# Patient Record
Sex: Male | Born: 1935 | Race: White | Hispanic: No | Marital: Married | Smoking: Former smoker
Health system: Southern US, Community
[De-identification: ages and names within clinical notes are randomized; demographics above are authoritative.]

## PROBLEM LIST (undated history)

## (undated) DIAGNOSIS — Z4502 Encounter for adjustment and management of automatic implantable cardiac defibrillator: Secondary | ICD-10-CM

## (undated) DIAGNOSIS — I2510 Atherosclerotic heart disease of native coronary artery without angina pectoris: Secondary | ICD-10-CM

## (undated) DIAGNOSIS — I4821 Permanent atrial fibrillation: Secondary | ICD-10-CM

## (undated) DIAGNOSIS — C189 Malignant neoplasm of colon, unspecified: Secondary | ICD-10-CM

## (undated) DIAGNOSIS — I10 Essential (primary) hypertension: Secondary | ICD-10-CM

## (undated) DIAGNOSIS — K913 Postprocedural intestinal obstruction, unspecified as to partial versus complete: Secondary | ICD-10-CM

## (undated) DIAGNOSIS — I272 Pulmonary hypertension, unspecified: Secondary | ICD-10-CM

## (undated) DIAGNOSIS — I5022 Chronic systolic (congestive) heart failure: Secondary | ICD-10-CM

## (undated) DIAGNOSIS — J189 Pneumonia, unspecified organism: Secondary | ICD-10-CM

## (undated) DIAGNOSIS — I480 Paroxysmal atrial fibrillation: Secondary | ICD-10-CM

## (undated) DIAGNOSIS — I340 Nonrheumatic mitral (valve) insufficiency: Secondary | ICD-10-CM

## (undated) DIAGNOSIS — K219 Gastro-esophageal reflux disease without esophagitis: Secondary | ICD-10-CM

## (undated) DIAGNOSIS — I255 Ischemic cardiomyopathy: Secondary | ICD-10-CM

## (undated) DIAGNOSIS — K5790 Diverticulosis of intestine, part unspecified, without perforation or abscess without bleeding: Secondary | ICD-10-CM

## (undated) DIAGNOSIS — N529 Male erectile dysfunction, unspecified: Secondary | ICD-10-CM

## (undated) DIAGNOSIS — D509 Iron deficiency anemia, unspecified: Secondary | ICD-10-CM

## (undated) HISTORY — DX: Diverticulosis of intestine, part unspecified, without perforation or abscess without bleeding: K57.90

## (undated) HISTORY — PX: HERNIA REPAIR: SHX51

## (undated) HISTORY — DX: Encounter for adjustment and management of automatic implantable cardiac defibrillator: Z45.02

## (undated) HISTORY — PX: COLON RESECTION: SHX5231

## (undated) HISTORY — DX: Chronic systolic (congestive) heart failure: I50.22

## (undated) HISTORY — PX: APPENDECTOMY: SHX54

## (undated) HISTORY — DX: Essential (primary) hypertension: I10

## (undated) HISTORY — PX: COLONOSCOPY: SHX174

## (undated) HISTORY — DX: Pneumonia, unspecified organism: J18.9

## (undated) HISTORY — PX: CHOLECYSTECTOMY: SHX55

## (undated) HISTORY — PX: BACK SURGERY: SHX140

## (undated) HISTORY — PX: VASECTOMY: SHX75

## (undated) HISTORY — DX: Postprocedural intestinal obstruction, unspecified as to partial versus complete: K91.30

## (undated) HISTORY — DX: Paroxysmal atrial fibrillation: I48.0

## (undated) HISTORY — PX: CORONARY ARTERY BYPASS GRAFT: SHX141

## (undated) HISTORY — DX: Iron deficiency anemia, unspecified: D50.9

## (undated) HISTORY — DX: Malignant neoplasm of colon, unspecified: C18.9

## (undated) HISTORY — DX: Atherosclerotic heart disease of native coronary artery without angina pectoris: I25.10

## (undated) HISTORY — DX: Male erectile dysfunction, unspecified: N52.9

---

## 1998-11-10 ENCOUNTER — Ambulatory Visit (HOSPITAL_COMMUNITY): Admission: RE | Admit: 1998-11-10 | Discharge: 1998-11-10 | Payer: Self-pay | Admitting: Gastroenterology

## 1998-11-10 ENCOUNTER — Other Ambulatory Visit: Admission: RE | Admit: 1998-11-10 | Discharge: 1998-11-10 | Payer: Self-pay | Admitting: Gastroenterology

## 1998-11-11 ENCOUNTER — Ambulatory Visit (HOSPITAL_COMMUNITY): Admission: RE | Admit: 1998-11-11 | Discharge: 1998-11-11 | Payer: Self-pay | Admitting: Gastroenterology

## 1998-11-11 ENCOUNTER — Encounter: Payer: Self-pay | Admitting: Gastroenterology

## 1998-11-16 ENCOUNTER — Inpatient Hospital Stay (HOSPITAL_COMMUNITY): Admission: RE | Admit: 1998-11-16 | Discharge: 1998-11-16 | Payer: Self-pay | Admitting: Surgery

## 1998-11-16 ENCOUNTER — Encounter: Payer: Self-pay | Admitting: Surgery

## 1998-11-24 ENCOUNTER — Observation Stay (HOSPITAL_COMMUNITY): Admission: AD | Admit: 1998-11-24 | Discharge: 1998-11-25 | Payer: Self-pay | Admitting: Cardiology

## 1998-12-18 ENCOUNTER — Inpatient Hospital Stay (HOSPITAL_COMMUNITY): Admission: RE | Admit: 1998-12-18 | Discharge: 1998-12-25 | Payer: Self-pay | Admitting: Surgery

## 1998-12-21 ENCOUNTER — Encounter: Payer: Self-pay | Admitting: Surgery

## 1998-12-24 ENCOUNTER — Encounter: Payer: Self-pay | Admitting: Oncology

## 1998-12-24 ENCOUNTER — Encounter: Admission: RE | Admit: 1998-12-24 | Discharge: 1999-03-24 | Payer: Self-pay | Admitting: Radiation Oncology

## 1999-03-25 ENCOUNTER — Encounter: Admission: RE | Admit: 1999-03-25 | Discharge: 1999-06-23 | Payer: Self-pay | Admitting: Radiation Oncology

## 2000-11-07 ENCOUNTER — Ambulatory Visit (HOSPITAL_COMMUNITY): Admission: RE | Admit: 2000-11-07 | Discharge: 2000-11-07 | Payer: Self-pay | Admitting: Cardiology

## 2000-11-07 HISTORY — PX: CARDIAC CATHETERIZATION: SHX172

## 2000-11-23 ENCOUNTER — Encounter (INDEPENDENT_AMBULATORY_CARE_PROVIDER_SITE_OTHER): Payer: Self-pay

## 2000-11-23 ENCOUNTER — Ambulatory Visit (HOSPITAL_COMMUNITY): Admission: RE | Admit: 2000-11-23 | Discharge: 2000-11-23 | Payer: Self-pay | Admitting: Internal Medicine

## 2000-11-29 ENCOUNTER — Encounter (HOSPITAL_COMMUNITY): Admission: RE | Admit: 2000-11-29 | Discharge: 2001-02-27 | Payer: Self-pay | Admitting: Internal Medicine

## 2000-11-29 ENCOUNTER — Encounter: Payer: Self-pay | Admitting: Internal Medicine

## 2001-01-05 ENCOUNTER — Encounter: Payer: Self-pay | Admitting: Cardiothoracic Surgery

## 2001-01-08 ENCOUNTER — Encounter: Payer: Self-pay | Admitting: Cardiothoracic Surgery

## 2001-01-08 ENCOUNTER — Inpatient Hospital Stay (HOSPITAL_COMMUNITY): Admission: RE | Admit: 2001-01-08 | Discharge: 2001-01-12 | Payer: Self-pay | Admitting: Cardiothoracic Surgery

## 2001-01-09 ENCOUNTER — Encounter: Payer: Self-pay | Admitting: Cardiothoracic Surgery

## 2001-01-10 ENCOUNTER — Encounter: Payer: Self-pay | Admitting: Cardiothoracic Surgery

## 2001-01-11 ENCOUNTER — Encounter: Payer: Self-pay | Admitting: Cardiothoracic Surgery

## 2002-03-26 ENCOUNTER — Emergency Department (HOSPITAL_COMMUNITY): Admission: EM | Admit: 2002-03-26 | Discharge: 2002-03-26 | Payer: Self-pay | Admitting: Emergency Medicine

## 2002-03-26 ENCOUNTER — Encounter: Payer: Self-pay | Admitting: Emergency Medicine

## 2003-12-19 ENCOUNTER — Ambulatory Visit (HOSPITAL_COMMUNITY): Admission: RE | Admit: 2003-12-19 | Discharge: 2003-12-19 | Payer: Self-pay | Admitting: Internal Medicine

## 2004-10-20 ENCOUNTER — Emergency Department (HOSPITAL_COMMUNITY): Admission: EM | Admit: 2004-10-20 | Discharge: 2004-10-20 | Payer: Self-pay | Admitting: Emergency Medicine

## 2005-05-09 ENCOUNTER — Encounter (HOSPITAL_COMMUNITY): Admission: RE | Admit: 2005-05-09 | Discharge: 2005-05-09 | Payer: Self-pay | Admitting: Internal Medicine

## 2006-02-15 ENCOUNTER — Ambulatory Visit (HOSPITAL_COMMUNITY): Admission: RE | Admit: 2006-02-15 | Discharge: 2006-02-15 | Payer: Self-pay | Admitting: Internal Medicine

## 2006-09-06 ENCOUNTER — Ambulatory Visit: Payer: Self-pay | Admitting: Gastroenterology

## 2006-09-12 ENCOUNTER — Ambulatory Visit: Payer: Self-pay | Admitting: Gastroenterology

## 2006-09-12 ENCOUNTER — Encounter (INDEPENDENT_AMBULATORY_CARE_PROVIDER_SITE_OTHER): Payer: Self-pay | Admitting: Gastroenterology

## 2006-09-14 ENCOUNTER — Ambulatory Visit (HOSPITAL_COMMUNITY): Admission: RE | Admit: 2006-09-14 | Discharge: 2006-09-14 | Payer: Self-pay | Admitting: Gastroenterology

## 2006-09-20 ENCOUNTER — Ambulatory Visit: Payer: Self-pay | Admitting: Gastroenterology

## 2006-09-20 LAB — CONVERTED CEMR LAB: BUN: 11 mg/dL (ref 6–23)

## 2006-09-27 ENCOUNTER — Ambulatory Visit: Payer: Self-pay | Admitting: Cardiovascular Disease

## 2006-10-09 ENCOUNTER — Ambulatory Visit: Payer: Self-pay | Admitting: Gastroenterology

## 2007-06-11 ENCOUNTER — Inpatient Hospital Stay (HOSPITAL_COMMUNITY): Admission: EM | Admit: 2007-06-11 | Discharge: 2007-06-14 | Payer: Self-pay | Admitting: Emergency Medicine

## 2007-06-13 ENCOUNTER — Encounter (INDEPENDENT_AMBULATORY_CARE_PROVIDER_SITE_OTHER): Payer: Self-pay | Admitting: Internal Medicine

## 2008-01-23 ENCOUNTER — Encounter (HOSPITAL_COMMUNITY): Admission: RE | Admit: 2008-01-23 | Discharge: 2008-04-14 | Payer: Self-pay | Admitting: Internal Medicine

## 2008-02-12 ENCOUNTER — Ambulatory Visit: Payer: Self-pay | Admitting: Internal Medicine

## 2008-02-12 LAB — CONVERTED CEMR LAB
Basophils Relative: 0.5 % (ref 0.0–1.0)
Eosinophils Relative: 1.1 % (ref 0.0–5.0)
HCT: 26.3 % — ABNORMAL LOW (ref 39.0–52.0)
MCHC: 31.8 g/dL (ref 30.0–36.0)
MCV: 86.3 fL (ref 78.0–100.0)
Monocytes Absolute: 0.6 10*3/uL (ref 0.2–0.7)
Monocytes Relative: 7.2 % (ref 3.0–11.0)
Platelets: 283 10*3/uL (ref 150–400)
WBC: 8.5 10*3/uL (ref 4.5–10.5)

## 2008-02-13 ENCOUNTER — Ambulatory Visit: Payer: Self-pay | Admitting: Internal Medicine

## 2008-02-19 ENCOUNTER — Ambulatory Visit: Payer: Self-pay | Admitting: Internal Medicine

## 2008-02-20 ENCOUNTER — Inpatient Hospital Stay (HOSPITAL_COMMUNITY): Admission: EM | Admit: 2008-02-20 | Discharge: 2008-02-26 | Payer: Self-pay | Admitting: Emergency Medicine

## 2008-02-25 ENCOUNTER — Encounter: Payer: Self-pay | Admitting: Internal Medicine

## 2008-02-28 ENCOUNTER — Ambulatory Visit: Payer: Self-pay | Admitting: Internal Medicine

## 2008-03-03 ENCOUNTER — Ambulatory Visit: Payer: Self-pay | Admitting: Internal Medicine

## 2008-03-03 LAB — CONVERTED CEMR LAB
Eosinophils Relative: 1.7 % (ref 0.0–5.0)
HCT: 35.5 % — ABNORMAL LOW (ref 39.0–52.0)
Monocytes Absolute: 0.5 10*3/uL (ref 0.1–1.0)
Monocytes Relative: 8.5 % (ref 3.0–12.0)
Neutro Abs: 4.7 10*3/uL (ref 1.4–7.7)
RBC: 4.05 M/uL — ABNORMAL LOW (ref 4.22–5.81)
RDW: 19.3 % — ABNORMAL HIGH (ref 11.5–14.6)
WBC: 5.9 10*3/uL (ref 4.5–10.5)

## 2008-05-20 ENCOUNTER — Encounter (INDEPENDENT_AMBULATORY_CARE_PROVIDER_SITE_OTHER): Payer: Self-pay | Admitting: Orthopedic Surgery

## 2008-05-20 ENCOUNTER — Ambulatory Visit (HOSPITAL_BASED_OUTPATIENT_CLINIC_OR_DEPARTMENT_OTHER): Admission: RE | Admit: 2008-05-20 | Discharge: 2008-05-20 | Payer: Self-pay | Admitting: Orthopedic Surgery

## 2008-06-24 ENCOUNTER — Encounter: Payer: Self-pay | Admitting: Internal Medicine

## 2008-08-06 ENCOUNTER — Encounter: Payer: Self-pay | Admitting: Internal Medicine

## 2008-08-06 HISTORY — PX: CARDIOVASCULAR STRESS TEST: SHX262

## 2008-08-07 ENCOUNTER — Encounter: Payer: Self-pay | Admitting: Internal Medicine

## 2008-08-11 HISTORY — PX: US ECHOCARDIOGRAPHY: HXRAD669

## 2008-08-12 ENCOUNTER — Encounter: Payer: Self-pay | Admitting: Internal Medicine

## 2008-09-18 ENCOUNTER — Ambulatory Visit: Payer: Self-pay | Admitting: Internal Medicine

## 2008-09-24 ENCOUNTER — Ambulatory Visit: Payer: Self-pay | Admitting: Internal Medicine

## 2008-09-24 ENCOUNTER — Telehealth: Payer: Self-pay | Admitting: Internal Medicine

## 2008-09-24 LAB — CONVERTED CEMR LAB
Basophils Absolute: 0 10*3/uL (ref 0.0–0.1)
Basophils Relative: 0.3 % (ref 0.0–3.0)
Eosinophils Absolute: 0.1 10*3/uL (ref 0.0–0.7)
Eosinophils Relative: 2.2 % (ref 0.0–5.0)
GFR calc Af Amer: 84 mL/min
GFR calc non Af Amer: 70 mL/min
Hemoglobin: 13.2 g/dL (ref 13.0–17.0)
INR: 0.9 (ref 0.8–1.0)
Lymphocytes Relative: 13.4 % (ref 12.0–46.0)
MCHC: 33.7 g/dL (ref 30.0–36.0)
Monocytes Relative: 9.2 % (ref 3.0–12.0)
Neutro Abs: 4.1 10*3/uL (ref 1.4–7.7)
Neutrophils Relative %: 74.9 % (ref 43.0–77.0)
Platelets: 204 10*3/uL (ref 150–400)
Prothrombin Time: 10.7 s — ABNORMAL LOW (ref 10.9–13.3)
RDW: 14.7 % — ABNORMAL HIGH (ref 11.5–14.6)
Sodium: 143 meq/L (ref 135–145)
aPTT: 26.7 s (ref 21.7–29.8)

## 2008-09-30 ENCOUNTER — Inpatient Hospital Stay (HOSPITAL_COMMUNITY): Admission: RE | Admit: 2008-09-30 | Discharge: 2008-10-01 | Payer: Self-pay | Admitting: Internal Medicine

## 2008-09-30 ENCOUNTER — Ambulatory Visit: Payer: Self-pay | Admitting: Internal Medicine

## 2008-09-30 HISTORY — PX: CARDIAC DEFIBRILLATOR PLACEMENT: SHX171

## 2008-10-16 ENCOUNTER — Ambulatory Visit: Payer: Self-pay

## 2009-03-02 ENCOUNTER — Encounter: Payer: Self-pay | Admitting: Internal Medicine

## 2009-10-15 ENCOUNTER — Encounter (INDEPENDENT_AMBULATORY_CARE_PROVIDER_SITE_OTHER): Payer: Self-pay | Admitting: *Deleted

## 2009-11-30 ENCOUNTER — Emergency Department (HOSPITAL_COMMUNITY): Admission: EM | Admit: 2009-11-30 | Discharge: 2009-11-30 | Payer: Self-pay | Admitting: Emergency Medicine

## 2009-11-30 ENCOUNTER — Encounter: Payer: Self-pay | Admitting: Internal Medicine

## 2010-07-07 ENCOUNTER — Ambulatory Visit: Payer: Self-pay | Admitting: Cardiology

## 2010-07-29 ENCOUNTER — Ambulatory Visit: Payer: Self-pay | Admitting: Cardiology

## 2010-11-03 ENCOUNTER — Encounter (INDEPENDENT_AMBULATORY_CARE_PROVIDER_SITE_OTHER): Payer: Self-pay | Admitting: *Deleted

## 2010-11-25 ENCOUNTER — Ambulatory Visit
Admission: RE | Admit: 2010-11-25 | Discharge: 2010-11-25 | Payer: Self-pay | Source: Home / Self Care | Attending: Internal Medicine | Admitting: Internal Medicine

## 2010-11-25 ENCOUNTER — Encounter: Payer: Self-pay | Admitting: Internal Medicine

## 2010-12-03 ENCOUNTER — Ambulatory Visit: Payer: Self-pay | Admitting: Cardiology

## 2010-12-28 NOTE — Letter (Signed)
Summary: Appointment - Missed  [facility], [place]  [address] [place]   [place], [place] [address]   Phone: [phone]  Fax: [phone]     November 03, 2010 MRN: [id number]   [name] [name] [address] [place], [place]  [address]   Dear [name] [name],  Our records indicate you missed your appointment on 10-27-10 with the [facility] . It is very important that we reach you to reschedule this appointment. We look forward to participating in your health care needs as we are taking over the device checks for Dr. [name]. Please contact us at the number listed above at your earliest convenience to reschedule this appointment.     Sincerely,    Public relations account executive

## 2010-12-28 NOTE — Consult Note (Signed)
Summary: [facility]   Imported By: Sallee Provencal 12/22/2009 16:48:03  _____________________________________________________________________  External Attachment:    Type:   Image     Comment:   External Document

## 2010-12-30 NOTE — Cardiovascular Report (Signed)
Summary: Office Visit   Office Visit   Imported By: Sallee Provencal 12/07/2010 14:07:15  _____________________________________________________________________  External Attachment:    Type:   Image     Comment:   External Document

## 2010-12-30 NOTE — Procedures (Signed)
Summary: icd check/medtronic   Current Medications (verified): 1)  Ramipril 5 Mg Caps (Ramipril) .... One By Mouth Daily 2)  Metoprolol Tartrate 50 Mg Tabs (Metoprolol Tartrate) .... 1/2 By Mouth Two Times A Day 3)  Amiodarone Hcl 200 Mg Tabs (Amiodarone Hcl) .... 1/2 By Mouth Daily 4)  Furosemide 40 Mg Tabs (Furosemide) .... One By Mouth Daily 5)  Klor-Con 20 10 Meq Cr-Tabs (Potassium Chloride) .... One By Mouth Every Other Day 6)  Pravastatin Sodium 20 Mg Tabs (Pravastatin Sodium) .... One By Mouth Daily 7)  Prilosec Otc 20 Mg Tbec (Omeprazole Magnesium) .... One By Mouth Daily 8)  Diphenoxylate-Atropine 2.5-0.025 Mg/37ml Liqd (Diphenoxylate-Atropine) .... As Needed 9)  Amlodipine Besylate 5 Mg Tabs (Amlodipine Besylate) .... One By Mouth Daily 10)  Metamucil 0.52 Gm Caps (Psyllium) .... 2  By Mouth Daily 11)  Multivitamins  Caps (Multiple Vitamin) .... One By Mouth Daily 12)  Vitamin D 1000 Unit Caps (Cholecalciferol) .... One By Mouth Daily  Allergies (verified): No Known Drug Allergies   ICD Specifications Following MD:  [name], MD     Referring MD:  Geanie Cooley ICD Vendor:  Medtronic     ICD Model Number:  [id number]     ICD Serial Number:  [id number] ICD DOI:  09/30/2008     ICD Implanting MD:  [name], MD  Lead 1:    Location: RA     DOI: 09/30/2008     Model #: [id number]     Serial #: [id number]     Status: active Lead 2:    Location: RV     DOI: 09/30/2008     Model #: [id number]     Serial #[id number]     Status: active  Indications::  ICM   ICD Follow Up Remote Check?  No Battery Voltage:  3.13 V     Charge Time:  10.0 seconds     Underlying rhythm:  Loletha Grayer ICD Dependent:  No       ICD Device Measurements Atrium:  Amplitude: 3.5 mV, Impedance: 616 ohms, Threshold: 0.50 V at 0.4 msec Right Ventricle:  Amplitude: 19.7 mV, Impedance: 544 ohms, Threshold: 0.5 V at 0.4 msec Shock Impedance: 46/59 ohms   Episodes MS Episodes:  1     Percent Mode Switch:  <0.1%      Coumadin:  No Shock:  0     ATP:  0     Nonsustained:  0     Atrial Pacing:  51.8%     Ventricular Pacing:  0.2%  Brady Parameters Mode DDDR+     Lower Rate Limit:  50     Upper Rate Limit 130 PAV 180     Sensed AV Delay:  150  Tachy Zones VF:  200     Next Cardiology Appt Due:  01/27/2011 Tech Comments:  Lead impedance alert values reprogrammed.  Rate response on today.  Optivol and thoracic impedance abnormal although Mr. Aikins does not have any edema and denies any SOB.  1 mode switch episode noted 4:34 hours, with ventricular rates>100bpm .  He is not on coumadin.  I will fax all these findings to Dr. [name] for the patient's January appt.   ROV 3 months with Dr. Rayann Heman then we will resume [facility] transmissions. Alma Friendly, LPN  December 29, 624THL 10:46 AM

## 2011-02-12 ENCOUNTER — Encounter: Payer: Self-pay | Admitting: Internal Medicine

## 2011-02-12 LAB — BASIC METABOLIC PANEL
CO2: 30 mEq/L (ref 19–32)
Calcium: 9 mg/dL (ref 8.4–10.5)
Chloride: 102 mEq/L (ref 96–112)
GFR calc Af Amer: >60 mL/min (ref >60)
Potassium: 4.1 mEq/L (ref 3.5–5.1)
Sodium: 139 mEq/L (ref 135–145)

## 2011-02-12 LAB — DIFFERENTIAL
Eosinophils Absolute: 0.1 10*3/uL (ref 0.0–0.7)
Monocytes Absolute: 0.6 10*3/uL (ref 0.1–1.0)
Monocytes Relative: 6 % (ref 3–12)
Neutrophils Relative %: 88 % — ABNORMAL HIGH (ref 43–77)

## 2011-02-12 LAB — POCT CARDIAC MARKERS
CKMB, poc: 3 ng/mL (ref 1.0–8.0)
Troponin i, poc: <0.05 ng/mL (ref 0.00–0.09)

## 2011-02-12 LAB — CBC
Hemoglobin: 12.1 g/dL — ABNORMAL LOW (ref 13.0–17.0)
MCHC: 34.6 g/dL (ref 30.0–36.0)
MCV: 95 fL (ref 78.0–100.0)
WBC: 9 10*3/uL (ref 4.0–10.5)

## 2011-02-12 LAB — DIGOXIN LEVEL: Digoxin Level: <0.2 ng/mL — ABNORMAL LOW (ref 0.8–2.0)

## 2011-02-25 ENCOUNTER — Encounter: Payer: Self-pay | Admitting: Internal Medicine

## 2011-03-21 ENCOUNTER — Encounter: Payer: Self-pay | Admitting: Internal Medicine

## 2011-03-21 ENCOUNTER — Ambulatory Visit (INDEPENDENT_AMBULATORY_CARE_PROVIDER_SITE_OTHER): Payer: Medicare PPO | Admitting: Internal Medicine

## 2011-03-21 ENCOUNTER — Other Ambulatory Visit: Payer: Self-pay | Admitting: Internal Medicine

## 2011-03-21 DIAGNOSIS — I428 Other cardiomyopathies: Secondary | ICD-10-CM

## 2011-03-21 DIAGNOSIS — I4891 Unspecified atrial fibrillation: Secondary | ICD-10-CM | POA: Insufficient documentation

## 2011-03-21 DIAGNOSIS — I2510 Atherosclerotic heart disease of native coronary artery without angina pectoris: Secondary | ICD-10-CM

## 2011-03-21 DIAGNOSIS — I509 Heart failure, unspecified: Secondary | ICD-10-CM

## 2011-03-21 DIAGNOSIS — I519 Heart disease, unspecified: Secondary | ICD-10-CM

## 2011-03-21 NOTE — Progress Notes (Signed)
[name] [name] is a pleasant 77 y.o. yo patient with a h/o ischemic CM (EF 30%), CAD, and NYHA Class II/III chronic systolic sysfunction sp ICD (MDT) by me who presents today to establish care in the Electrophysiology device clinic.  The patient reports doing very well since having a defibrillator implanted and remains very active despite his age. He received ICD shocks a year ago for afib and had his VT zone adjusted.  He did not tolerate amiodarone 200mg  daily due to dizziness but has done well with 100mg  daily.  He cannot take coumadin or ASA due to significant GI bleeding in the past.  Today, he  denies symptoms of palpitations, chest pain, shortness of breath, orthopnea, PND, lower extremity edema, dizziness, presyncope, syncope, or neurologic sequela.  The patientis tolerating medications without difficulties and is otherwise without complaint today.   Past Medical History  Diagnosis Date  . Atrial fibrillation     controlled with amiodarone, not a coumadin candidate due to GI bleeding  . CAD (coronary artery disease)     prior MI  . HTN (hypertension)   . Colon cancer   . Colorectal anastomotic stricture   . Cardiomyopathy     s/p ICD 09/30/08  . Iron deficiency anemia   . Diverticulosis   . ED (erectile dysfunction)   . Chronic systolic dysfunction of left ventricle     Past Surgical History  Procedure Date  . Vasectomy   . Colon resection   . Coronary artery bypass graft   . Cholecystectomy   . Back surgery   . Hernia repair   . Cardiac defibrillator placement 09/30/08    by Greggory Brandy    History   Social History  . Marital Status: Married    Spouse Name: N/A    Number of Children: N/A  . Years of Education: N/A   Occupational History  . Not on file.   Social History Main Topics  . Smoking status: Current Everyday Smoker    Types: Cigars  . Smokeless tobacco: Not on file  . Alcohol Use: No  . Drug Use: No  . Sexually Active: Not on file   Other Topics Concern  . Not  on file   Social History Narrative   Lives in [place] with spouse.  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    No family history on file.  Not on File  Current Outpatient Prescriptions  Medication Sig Dispense Refill  . amiodarone (PACERONE) 200 MG tablet Take 100 mg by mouth daily.        Marland Kitchen amLODipine (NORVASC) 5 MG tablet Take 5 mg by mouth daily.        . cholecalciferol (VITAMIN D) 1000 UNITS tablet Take 1,000 Units by mouth daily.        . diphenoxylate-atropine (LOMOTIL) 2.5-0.025 MG per tablet Take 2 tablets by mouth as needed.       . furosemide (LASIX) 40 MG tablet Take 40 mg by mouth daily.        . metoprolol (LOPRESSOR) 50 MG tablet Take 25 mg by mouth 2 (two) times daily.        . Multiple Vitamin (MULTIVITAMIN) tablet Take 1 tablet by mouth daily.        Marland Kitchen omeprazole (PRILOSEC OTC) 20 MG tablet Take 20 mg by mouth daily.        . Pitavastatin Calcium (LIVALO) 4 MG TABS Take 2 tablets by mouth daily.        Marland Kitchen  psyllium (REGULOID) 0.52 G capsule Take 0.52 g by mouth 2 (two) times daily.        . ramipril (ALTACE) 5 MG capsule Take 5 mg by mouth daily.        . potassium chloride SA (K-DUR,KLOR-CON) 20 MEQ tablet Take 20 mEq by mouth every other day.        . pravastatin (PRAVACHOL) 20 MG tablet Take 20 mg by mouth daily.         Physical Exam: Filed Vitals:   03/21/11 1108  BP: 138/70  Pulse: 64  Height: 5\' 11"  (1.803 m)  Weight: 174 lb (78.926 kg)    GEN- The patient is well appearing, alert and oriented x 3 today.   Head- normocephalic, atraumatic Eyes-  Sclera clear, conjunctiva pink Ears- hearing intact Oropharynx- clear Neck- supple, no JVP Lymph- no cervical lymphadenopathy Lungs- Clear to ausculation bilaterally, normal work of breathing Chest- ICD pocket is well healed Heart- Regular rate and rhythm, no murmurs, rubs or gallops, PMI not laterally displaced GI- soft, NT, ND, + BS Extremities- no clubbing, cyanosis, or edema MS- no significant  deformity or atrophy Skin- no rash or lesion Psych- euthymic mood, full affect Neuro- strength and sensation are intact  ICD interrogation- reviewed in detail today,  See PACEART report  Assessment and Plan:

## 2011-03-21 NOTE — Patient Instructions (Signed)
Your physician wants you to follow-up in: 12 months with Dr Vallery Ridge will receive a reminder letter in the mail two months in advance. If you don't receive a letter, please call our office to schedule the follow-up appointment.  Remote monitoring is used to monitor your Pacemaker of ICD from home. This monitoring reduces the number of office visits required to check your device to one time per year. It allows [place] to keep an eye on the functioning of your device to ensure it is working properly. You are scheduled for a device check from home on 06/23/11. You may send your transmission at any time that day. If you have a wireless device, the transmission will be sent automatically. After your physician reviews your transmission, you will receive a postcard with your next transmission date.

## 2011-03-21 NOTE — Assessment & Plan Note (Signed)
Doing well No chf on exam Salt restriction advised Normal ICD function today , no changes, see paceart.

## 2011-03-21 NOTE — Assessment & Plan Note (Signed)
Maintaining sinus rhythm with low dose amiodarone Not a coumadin candidate due to GI bleeding.

## 2011-03-21 NOTE — Assessment & Plan Note (Signed)
No ischemic symptoms He has not tolerated ASA due to GI bleeding. He is not interested in stopping cigar smoking at this time but is encouraged to quit.

## 2011-04-11 ENCOUNTER — Encounter: Payer: Self-pay | Admitting: Internal Medicine

## 2011-04-12 NOTE — Letter (Signed)
September 18, 2008    Peter M. [name], MD  [facility] [address], [place], [place] [address]   RE:  SHEPARD, SADD  MRN:  [id number]  /  DOB:  1932/06/15   Dear Collier Salina,   It was my pleasure to see your patient, Glennis Rauseo in EP consultation  today regarding risk stratification for sudden cardiac death.  As you  recall, Mr. Meech is a very pleasant 77 year old gentleman with a  history of coronary artery disease, ischemic cardiomyopathy, ejection  fraction 30%, and New York Heart Association class II heart failure  symptoms chronically.  He recently presented to your clinic for  followup.  A recent stress Cardiolite was performed, which revealed good  exercise tolerance with activities.  He did have extensive inferolateral  infarct with no ischemia noted.  Compared to a previous Cardiolite, the  anterolateral wall perfusion had improved.  The patient's ejection  fraction was found to be 30%.  This was confirmed by echocardiography.   The patient reports doing very well recently.  He remains quite active.  He and his wife like to dance each week.  He also walks his dog  approximately a mile and half each day.  With good activity effort, he  denies chest pain, shortness of breath, fatigue, or other symptoms.  He  has had atrial fibrillation previously and this appears to be well  controlled with amiodarone.  He denies palpitations, heart racing,  presyncope, or syncope.  He is unaware of any further symptomatic  episodes of atrial fibrillation.  He is otherwise without complaint  today.   PAST MEDICAL HISTORY:  1. Coronary artery disease status post coronary artery bypass surgery      in 2001 by Dr. Servando Snare.  2. Ischemic cardiomyopathy (ejection fraction 30%).  3. New York Heart Association class II heart failure chronically.  4. History of colon cancer.  He has had colonic resections in 1980 and      in 1999.  He has had previous chemotherapy and radiation therapy      for  this.  5. Iron deficiency anemia.  6. Hypertension.  7. Hyperlipidemia.  8. History of hernia repair.   MEDICATIONS:  1. Ramipril 5 mg daily.  2. Metoprolol 25 mg b.i.d.  3. Amiodarone 200 mg daily.  4. Lasix 40 mg daily.  5. Potassium chloride 20 mEq q.o.d.  6. Pravachol 20 mg daily.  7. Vitamin D daily.  8. Multivitamin daily.  9. Metamucil daily.  10.Prilosec 20 mg daily.   ALLERGIES:  No known drug allergies.   SOCIAL HISTORY:  The patient lives in [place].  He is retired.  He  smoked previously but quit approximately 40 years ago.  He continues to  smoke a cigar per day.  He rarely drinks alcohol and denies drug use.   FAMILY HISTORY:  The patient's mother died at age 94 of old age.  Both  his father and brother died of colon cancer.  He has a sister with  diabetes.   REVIEW OF SYSTEMS:  All systems are reviewed and negative except as  outlined in the HPI above.   PHYSICAL EXAMINATION:  VITALS:  Blood pressure 142/70, heart rate 53,  respirations 18, weight 184 pounds.  GENERAL:  The patient is a well-appearing male in no acute distress.  He  is alert and oriented x3.  HEENT:  Normocephalic, atraumatic.  Sclerae clear.  Conjunctivae pink.  Oropharynx clear.  NECK:  Supple.  No JVD,  lymphadenopathy, or bruits.  No thyromegaly.  LUNGS:  Clear to auscultation bilaterally.  HEART:  Regular rate and rhythm.  No murmurs, rubs, or gallops.  GI:  Soft, nontender, nondistended.  Positive bowel sounds.  EXTREMITIES:  No clubbing, cyanosis, or edema.  NEUROLOGIC:  Cranial nerves II through XII are intact.  Strength and  sensation are intact.  SKIN:  No ecchymosis or lacerations.  MUSCULOSKELETAL:  No deformity or atrophy.  PSYCH:  Euthymic mood, full affect.   EKG reveals sinus bradycardia at 53 beats per minute.  An  intraventricular conduction delay is noted with a QRS duration of 122  milliseconds.  The PR interval is 166 milliseconds.   IMPRESSION:  Mr. Wagaman is  a very pleasant 77 year old gentleman with a  history of coronary artery disease, ischemic cardiomyopathy, ejection  fraction of 30%, and New York Heart Association class II heart failure  symptoms chronically.  He also has a mild intraventricular conduction  delay.  Peter, I agree with you that he fits the profile of a patient at  increased risk for sudden cardiac death.  He meets criteria for  implantable cardioverter-defibrillator implantation based on sudden  cardiac death in heart failure trial.  The patient's exercise tolerance  is currently well preserved.  I do not think that his intraventricular  conduction delay is suggestive of significant dyssynchrony.  As you  know, CRT recently evaluated patients similar to Mr. Hadaway and found  that the patients with QRS durations on the order of 140 and 150  milliseconds benefited most from resynchronization where as patients  like Mr. Hudelson appear to receive little benefit.  I therefore think  that when weighing all factors that he would benefit most from  implantation of an implantable cardioverter-defibrillator.   PLAN:  Risks, benefits, and alternatives of a dual-chamber ICD were  discussed in detail with the patient today.  These risks include but are  not limited to infection, bleeding, pneumothorax, pericardial effusion,  and stroke.  The patient understands these risks and wishes to proceed  with ICD implantation.  We will therefore schedule the patient to have a  dual-chamber Medtronic defibrillator placed at the next available time.   Thank you for the opportunity of participating in the care of Mr.  Madeja.    Sincerely,      [name], MD  Electronically Signed    JA/MedQ  DD: 09/18/2008  DT: 09/19/2008  Job #: [id number]   CC:    Elta Guadeloupe A. Perini, M.D.

## 2011-04-12 NOTE — H&P (Signed)
NAMETHOMASJAMES, PETRELLA NO.:  [id number]   MEDICAL RECORD NO.:  [id number]          PATIENT TYPE:  INP   LOCATION:  [place]                         FACILITY:  [facility]   PHYSICIAN:  Docia Chuck. Henrene Pastor, MD      DATE OF BIRTH:  March 01, 1932   DATE OF ADMISSION:  02/21/2008  DATE OF DISCHARGE:                              HISTORY & PHYSICAL   PROBLEM:  Rectal bleeding.   HISTORY:  Mr. Ammann is a very nice 77 year old white male recently  known to Dr. Carlean Purl, primary patient of Dr. Joylene Draft, who has history of  two previous colon cancers initially diagnosed in 80 and then again in  1999.  He is status post chemo and radiation and has had a right  hemicolectomy.  He also has had previously documented anastomotic  stricture and had an anastomotic ulcer noted on colonoscopy in 2007.  At  this time, he presents with intermittent blood in his stools and  complaints of melena over the past couple of weeks.  He had been seen by  Dr. Carlean Purl in the office on February 12, 2008, and underwent colonoscopy  and EGD within the past week, both were unrevealing for sources of  bleeding.  He was noted to have a normal enteroscopy with Dr. Carlean Purl as  well.  Colonoscopy showed a right hemicolectomy anastomosis was stenotic  but no ulceration or inflammation seen, but diverticulosis in the  sigmoid colon, otherwise negative exam.   The patient had had an iron infusion on March 11 per Dr. Joylene Draft and says  that the black stools started around that same time.  Generally he is  having one to four bowel movements per day.  Has had no associated pain  or cramping.  No nausea, vomiting, etc.  He says he did not see any dark  stools after his colonoscopy for several days, and then 2 days ago the  black stool restarted.  Last evening he had an urge for bowel movement  and then passed a large amount of black and grossly red bloody stool.  He had two episodes at home and then presented to the emergency room.  Had one further episode in the ER which was less volume.  He has had no  further stools since.   PAST HISTORY:  1. Atrial fibrillation.  2. History of atrial fibrillation.  3. Coronary artery disease.  He is status post MI and CABG in 2002.  4. Hypertension.  5. Colon cancer x2, status post resection and 1980 and 1998.  6. Cardiomyopathy with EF of 40.  7. Status post cholecystectomy.  8. Status post inguinal hernia repairs.  9. Status post laminectomy in 1998.  10.Diverticulosis.   LABORATORY STUDIES:  On admission, WBC of 5, hemoglobin 8.1, hematocrit  of 24.6, platelets 240.  Sodium 141, potassium 3.7, BUN 15, creatinine  1.3.   MEDICATIONS ON ADMISSION:  1. Metoprolol 25 b.i.d.  2. Prilosec once daily.  3. Crestor 5 mg daily.  4. Amiodarone 200 daily.  5. Aspirin 325 daily.  6. Altace 5 mg daily.  7. Lomotil as  needed.  8. Multivitamin daily.  9. Vitamin D3 daily.  10.Fiber supplement daily.   ALLERGIES:  INTOLERANCES VYTORIN WITH LEG CRAMPS AND LESCOL WITH  DIZZINESS.   FAMILY HISTORY:  Father with colon cancer.   SOCIAL HISTORY:  The patient is married, retired, has a supportive  family.  He does work part-time, smokes a cigar socially.   REVIEW OF SYSTEMS:  GENERAL:  Negative.  CARDIOVASCULAR: No current  chest pain or anginal symptoms.  PULMONARY:  Denies any cough, shortness  of breath or sputum production.  GENITOURINARY:  Negative.  GI: As  above.  MUSCULOSKELETAL:  Negative.  NEUROLOGICAL:  Negative.  All other  review of systems negative.   PHYSICAL EXAMINATION:  GENERAL:  Well-developed white male in no acute  distress.  He is alert and oriented x3.  VITAL SIGNS:  Temperature is 97.8, blood pressure 120/60, pulse 54,  sats 97 on room air.  HEENT: Nontraumatic, normocephalic.  EOMI, PERRLA.  Sclerae anicteric.  NECK:  Supple.  There is no JVD.  CARDIOVASCULAR:  Regular rate and rhythm with S1-S2.  No murmur, rub or  gallop.  PULMONARY:  Clear to A and  P.  ABDOMEN:  Soft, nontender.  There is no palpable mass or  hepatosplenomegaly.  Bowel sounds are active.  RECTAL:  Exam reveals mucousy dark red stools.  NEUROLOGICAL:  Grossly nonfocal   IMPRESSION:  11. A 77 year old white male with intermittent melena times 2-3 weeks      of unclear etiology now with gross hematochezia, recent negative      colonoscopy and EGD with enteroscopy, and therefore, suspect small      bowel source.  2. Anemia secondary to above.  3. History of colon cancer x2, status post right hemicolectomy and      patient with known ileocolonic stricture.  4. History of atrial fibrillation.  5. Coronary artery disease status post coronary artery bypass graft,      myocardial infarction and cardiomyopathy with ejection fraction for      sent   PLAN:  The patient is admitted to the service Dr. Scarlette Shorts for IV  fluid hydration, transfusions to keep hemoglobin in the 9-10 range.  Will schedule urgent EGD for today, and if this is negative, CT  enteropathy to r/o mass or small bowel diverticula. No plans for capsule  endoscopy due to high grade anastomotic stricture.  For additional  details please see the orders.      Amy Esterwood, PA-C      John N. Henrene Pastor, MD  Electronically Signed    AE/MEDQ  D:  02/22/2008  T:  02/23/2008  Job:  [id number]   cc:   Elta Guadeloupe A. Perini, M.D.  Fax: [phone]

## 2011-04-12 NOTE — Consult Note (Signed)
[name] [name] NO.:  [id number]   MEDICAL RECORD NO.:  [id number]          PATIENT TYPE:  INP   LOCATION:  [place]                         FACILITY:  [facility]   PHYSICIAN:  [name] [name], M.D.  DATE OF BIRTH:  [birth year]   DATE OF CONSULTATION:  DATE OF DISCHARGE:                                 CONSULTATION   HISTORY OF PRESENT ILLNESS:  [name] [name] is a 77 year old, white male  admitted for management of atrial fibrillation with a rapid ventricular  response.  This last Thursday evening, the patient developed acute onset  of diaphoresis, chest pain described as a tightness, and shortness of  breath.  This lasted approximately 2 hours and then resolved.  He states  at that time his pulse rate was in the 60s.  Since then, he has felt  much weaker.  He did feel lightheaded some on Saturday and Sunday.  He  has had no recurrent chest pain or shortness of breath.  He was seen by  Dr. Russo today and was noted to be in atrial fibrillation with rapid  ventricular response with a rate up to 150.  He has no prior history of  arrhythmia.  He does have a long history of coronary disease.  He had  had multivessel angioplasties in 1984. In 1999, he had stent to the left  circumflex coronary artery.  Following colon surgery he suffered  inferolateral myocardial infarction.  He subsequently underwent coronary  artery bypass surgery in 2001 by Dr. Gerhardt.  At that time, he had a  LIMA graft placed to LAD, vein graft to the diagonal, vein graft to the  left circumflex coronary artery, and vein graft to the PDA.  He has had  no subsequent studies of his heart since his bypass surgery.  His last  visit with me was in 2005.   PAST MEDICAL HISTORY:  1. Coronary artery disease as noted above.  He has had prior      myocardial infarction and prior coronary artery bypass surgery.  2. Hypertension.  3. History of colon cancer.  He has had 2 prior resections in 1980 and      19 99.   He has had prior chemotherapy and radiation therapy.  He      does have a history of some stricture at the anastomotic site.  4. Status post cholecystectomy.  5. Chronic iron deficiency anemia.  6. History of occult the lower GI bleed, question hemorrhoidal.  The      patient has had a colonoscopy within the last 6 months.   MEDICATIONS:  1. Lopressor 50 mg b.i.d.  2. Altace 5 mg per day.  3. Aspirin 325 mg per day.  4. Multivitamin daily.  5. Prilosec over-the-counter.   He has no known allergies.   SOCIAL HISTORY:  The patient is married.  He is a nonsmoker and  nondrinker.  He has 2 children.  He is retired.   FAMILY HISTORY:  Mother died at age 31 of old age.  Both his father and  brother died with colon cancer.  One  sister has had diabetes.   REVIEW OF SYSTEMS:  He has had no history of stroke or TIA.  He denies  any claudication symptoms.  He has had no overt bleeding recently.  He  currently denies any bowel or bladder complaints.  The patient has  received intermittent iron infusions approximately once a month for his  anemia.   PHYSICAL EXAMINATION:  GENERAL:  The patient is a pleasant, white male  in no distress.  VITAL SIGNS:  Blood pressure 102/60.  Pulse is 130 in atrial  fibrillation.  He is afebrile.  HEENT:  He has a twitch in his right eyelid.  His pupils equal, round,  and reactive.  His oropharynx is clear.  NECK:  Supple without JVD, adenopathy, thyromegaly, or bruits.  LUNGS:  Clear.  CARDIAC:  Regular rate and rhythm without murmur, rub, or gallop.  ABDOMEN:  Reveals multiple old surgical scars.  He has no masses or  bruits.  Femoral and pedal pulses were 2+ and symmetric.  He has no  edema.  NEUROLOGIC:  Intact.   LABORATORY DATA:  ECG shows atrial fibrillation, rapid ventricular  response, and old inferior myocardial infarction.  He has lateral ST-T  wave changes.  Chest x-ray shows cardiomegaly with no active disease.  White count 7300,  hemoglobin 9.6, hematocrit 30.7, platelets 276,000.  Sodium is 139, potassium 4.7, chloride 107, CO2 26, BUN 26, creatinine  1.2, glucose of 128.  LFTs were normal.   IMPRESSION:  1. Atrial fibrillation, rapid ventricular response, duration is      unclear.  2. Coronary artery disease status post old myocardial infarction,      status post coronary artery bypass surgery.  3. Chronic iron deficiency anemia.  4. Hypertension.  5. Colon cancer, status post resection x2.   PLAN:  We will admit to telemetry.  We will check serial cardiac enzymes  and ECG.  We will check thyroid function.  We will obtain baseline  echocardiogram and pulmonary function studies.  We will fully  anticoagulate with Lovenox.  We will need to watch closely for any  evidence of bleeding and subsequently need to determine the suitability  of Coumadin therapy.  We will proceed with rate control with digoxin, IV  Cardizem, and oral metoprolol.  We will initiate amiodarone therapy to  try and convert the patient to sinus rhythm and help mitigate the need  for long-term Coumadin therapy.           ______________________________  [name] [name], M.D.     PMJ/MEDQ  D:  06/11/2007  T:  06/12/2007  Job:  [id number]   cc:   Precious Reel, MD  Jeannette How. Perini, M.D.  Clarene Reamer, MD

## 2011-04-12 NOTE — Op Note (Signed)
NAMECHEVIS, LOUNDS NO.:  [id number]   MEDICAL RECORD NO.:  [id number]          PATIENT TYPE:  OIB   LOCATION:  [id number]                         FACILITY:  [facility]   PHYSICIAN:  [name], MD       DATE OF BIRTH:  1932-01-26   DATE OF PROCEDURE:  09/30/2008  DATE OF DISCHARGE:                               OPERATIVE REPORT   SURGEON:  [name], MD   PREPROCEDURE DIAGNOSES:  1. Coronary artery disease.  2. Ischemic cardiomyopathy (ejection fraction 30%).  3. New York Heart Association class II/III heart failure symptoms      chronically.  4. Atrial fibrillation.   POSTPROCEDURE DIAGNOSES:  1. Coronary artery disease.  2. Ischemic cardiomyopathy (ejection fraction 30%).  3. New York Heart Association class II/III heart failure symptoms      chronically.  4. Atrial fibrillation.   PROCEDURES:  1. Dual chamber implantable cardioverter defibrillator implantation.  2. Left upper extremity venogram.  3. Defibrillation threshold testing with programmed extrastimulus      testing.   DESCRIPTION OF PROCEDURE:  Informed written consent was obtained and the  patient was brought to the Electrophysiology Lab in the fasting state.  He was adequately sedated with intravenous Valium, Versed, and fentanyl  as outlined in the nursing report.  The patient's left chest was prepped  and draped in the usual sterile fashion by the EP lab staff.  Local  lidocaine anesthetic was infiltrated into the skin over the left  deltopectoral region for analgesia.  A 6-cm incision was made over the  left deltopectoral region.  A subcutaneous defibrillator pocket was  fashioned using a combination of sharp and blunt dissection.  Electrocautery was used to assure hemostasis.  A venogram of the left  upper extremity was performed which revealed a small but patent left  axillary and subclavian veins.  Using a modified percutaneous Seldinger  technique, the left axillary vein was accessed  and through this vein, a  Medtronic model [id number] (serial [id number]) right atrial lead and a  Medtronic Sprint Quattro Secure model [id number] (serial [id number]) right  ventricular defibrillator lead were advanced into the right atrial  appendage and right ventricular apex positions respectively.  The leads  were actively fixed to the myocardium in these locations.  Initial lead  measurements revealed an atrial lead P-wave of 1.6 mV with an impedance  of 699 ohms and a threshold of 1.7 volts at 0.5 milliseconds.  The right  ventricular lead R-wave measured 26.5 mV with an impedance of 900 ohms  and a threshold of 0.7 volts at 0.5 milliseconds.  The leads were  secured to the pectoralis fascia using #2 silk suture over the suture  sleeves.  The leads were then connected to a Medtronic Virtuoso DR,  model D154 AWG (serial [id number]) dual-chamber defibrillator.  The  defibrillator was placed into the subcutaneous pocket.  The pocket was  closed in 2 layers with 2-0 Vicryl suture for the subcutaneous and  subcuticular layers.  Steri-Strips and a sterile bandage were then  applied.  DFT testing was then performed.  Ventricular  fibrillation was  induced with a T-shock.  There was adequate sensing of ventricular  fibrillation with no dropouts when programmed at a sensitivity of 1.2  mV.  A single 15-joule shock successfully defibrillated the patient with  an impedance of 38 ohms and a duration of 7 seconds to normal sinus  rhythm.  Programmed extrastimulus testing was then performed through the  device and ventricular tachycardia was induced at 500/270/260/290  milliseconds.  The ventricular tachycardia cycle length was 309  milliseconds with a right bundle-branch right inferior axis VT  morphology.  ATP was delivered during charging, but was unsuccessful in  terminating ventricular tachycardia.  A single 15-joule shock was  delivered through the device which converted the patient to sinus   rhythm.  The procedure was therefore considered completed.  There were  no early apparent complications.   CONCLUSIONS:  1. Successful dual-chamber implantable cardioverter defibrillator      implantation.  2. Inducible ventricular tachycardia with a cycle length of 309      milliseconds and a right bundle-branch right inferior axis      morphology.  3. Defibrillation threshold less than or equal to 15 joules.  4. No early apparent complications.      [name], MD  Electronically Signed     JA/MEDQ  D:  09/30/2008  T:  09/30/2008  Job:  [id number]   cc:   Peter M. [name], M.D.

## 2011-04-12 NOTE — Discharge Summary (Signed)
[name] [name] NO.:  [id number]   MEDICAL RECORD NO.:  [id number]          PATIENT TYPE:  INP   LOCATION:  [place]                         [facility]ACILITY:  [facility]   PHYSICIAN:  Lowella Bandy. Olevia Perches, MD     DATE OF BIRTH:  1932-11-26   DATE OF ADMISSION:  02/20/2008  DATE OF DISCHARGE:  02/26/2008                               DISCHARGE SUMMARY   ADMISSION DIAGNOSES:  44. A 77 year old white male with intermittent melena x2 to 3 weeks,      etiology not clear now with gross hematochezia, recent negative      colonoscopy and EGD (esophagogastroduodenoscopy) with enteroscopy      and therefore suspect small bowel source.  2. Anemia secondary to above.  3. History of iron deficiency anemia, status post recent iron      infusion.  4. History of colon cancer x2 status post right hemicolectomy and      previously documented ileocolonic stricture.  5. History of atrial fibrillation.  6. Coronary artery disease status post CABG (coronary artery bypass      graft), MI (myocardial infarction) and cardiomyopathy with ejection      fraction of 40%.   DISCHARGE DIAGNOSES:  1. Acute and subacute GI (gastrointestinal) bleed felt secondary to      ileal ulcerations just to proximal side of ileocolonic anastomosis.  2. Anemia with transfusion requirement secondary to above.  3. History of iron-deficiency anemia status post recent iron infusion      with improvement in iron studies.  4. History of colon cancer x2 status post right hemicolectomy and      previously documented ileocolonic stricture.  5. History of atrial fibrillation.  6. Coronary artery disease status post CABG (coronary artery bypass      graft), MI (myocardial infarction) and cardiomyopathy with ejection      fraction of 40%.   CONSULTATIONS:  None.   PROCEDURES:  1. Upper endoscopy.  2. CT enterography.  3. Colonoscopy with balloon dilation of the ileal stricture and      biopsies of terminal ileum per Dr.  Olevia Perches on 02/25/2008.   BRIEF HISTORY:  [name] is a very nice 77 year old white male  recently known to Dr. Carlean Purl, primary patient of Dr. Joylene Draft with  history of two previous colon cancers and presenting with intermittent  melena over the past couple of weeks.  He was seen by Dr. Carlean Purl in the  office on 02/12/2008 and then underwent subsequent colonoscopy and upper  endoscopy, both of which were unrevealing as to the source of his  bleeding.  He was found to have colonic diverticulosis.  He also had  enteroscopy by Dr. Carlean Purl which was negative.  He had an iron infusion  per Dr. Joylene Draft on 02/06/2008.  He says that his black stool started  around that same time and that he has been having one to four bowel  movements per day, has not noticed any abdominal pain or cramping,  nausea, vomiting, and so forth and he says he did not see any black  stools after the colonoscopy was  done per Dr. Carlean Purl and then two days  ago the melena restarted.  Last evening he says he had urgency for bowel  movement and then passed a large amount of bright red blood mixed with  black stool.  He had two episodes at home and then presented to the  emergency room for evaluation.   LABORATORY STUDIES ON ADMISSION:  On 02/20/2008, hemoglobin 8.1,  hematocrit of 24.6, platelets 240,000, WBC of 5, sodium 141, potassium  3.7, BUN 15, creatinine 1.3.  Serial values were obtained on 02/22/2008  and hemoglobin was 9.4 post transfusions.  On 02/23/2008, hemoglobin was  back to 8.3, hematocrit of 24.5.  He was transfused again, hemoglobin up  to 10.6, hematocrit of 30.4.  On 02/25/2008, hemoglobin was 10,  hematocrit of 29.2 and on 02/26/2008, hemoglobin 10, hematocrit of 29.7.  Serum iron 27, TIBC of 298, iron saturation of 9.  CEA was 1.2.   X-RAY STUDIES:  CT enterography done on 02/22/2008, mild cardiomegaly,  prior cholecystectomy, multiple hypodense renal lesions stable from 2005  and likely representing  cysts, right enterocolonic anastomosis is  visible just low the level of the iliac crest, mild small bowel wall  thickening in the bowel extending to the anastomosis and a separate but  adjacent loop of small bowel demonstrates mild mucosal enhancement, no  discrete mass, no adenopathy, trace amount of pelvic fluid.   HOSPITAL COURSE:  The patient was admitted to the service of Dr. Erskine Emery who was covering the hospital and placed on a clear liquid diet.  He was transfused 2 units of packed RBCs, placed on IV Protonix and then  scheduled for repeat EGD which was done per Dr. Henrene Pastor.  This was  negative and he was then scheduled for CT enterography with findings as  outlined above.  This suggested a distal small bowel mucosal  enhancement.  The patient did not have any further active hemorrhaging  during his hospitalization but continued to have melenic-type stools.  He required a total of 5 units of packed RBCs and then his hemoglobin  stabilized in the 10 range.  It was discussed with the patient regarding  further workup with double balloon enteroscopy at [facility]  versus repeat colonoscopy with balloon dilation of his ileal colonic  stricture with attempt to visualize the distal ileum.  A decision was  made to proceed with colonoscopy first.  This was done per Dr. Olevia Perches on  02/25/2008.  A balloon dilation was done to 16.5 mm and there were  several ulcers noted in the distal ileum 2 to 5 mm in size located in  the last 10 to 15 cm of the terminal ileum.  It appeared nonspecific,  possibly ischemic.  There was no stigmata of recent bleeding.  He also  was noted to have moderately severe diverticular disease of the colon.  At that point, his hemoglobin had remained stable.  Biopsies were taken  and are pending at this time.  He is discharged to home on 02/26/2008  with plans to follow up with Dr. Carlean Purl in the office in one week on  03/03/2008.  We will repeat his  hemoglobin at that time and is asked to  call for any evidence of further active bleeding in the interim.  He  will stay off of his aspirin over the next week.  Further treatment will  depend on results of the biopsies.  He is discharged in a  stable condition.  He is to  resume his Altace 5 mg daily, amiodarone 200  daily, Crestor 5 mg daily, Prilosec 20 mg daily, metoprolol 25 b.i.d.,  Lomotil as needed, multivitamins daily, vitamin D supplement daily and  fiber capsule daily.      Amy Kooskia, PA-C      Dora M. Olevia Perches, MD  Electronically Signed    AE/MEDQ  D:  02/26/2008  T:  02/26/2008  Job:  [id number]   cc:   Gatha Mayer, MD,FACG  [facility]  [address] [place], [place] [address]   Jeannette How. Perini, M.D.  Fax: [phone]

## 2011-04-12 NOTE — Discharge Summary (Signed)
[name], [name] NO.:  [id number]   MEDICAL RECORD NO.:  [id number]          PATIENT TYPE:  INP   LOCATION:  3742                         FACILITY:  [facility]   PHYSICIAN:  Precious Reel, MD       DATE OF BIRTH:  02-07-1932   DATE OF ADMISSION:  06/11/2007  DATE OF DISCHARGE:                               DISCHARGE SUMMARY   PRIMARY CARE [name] [name]:  [name] [name].   PRIMARY CARDIOLOGIST:  [name] [name].   DISCHARGE DIAGNOSES:  1. Atrial fibrillation with rapid ventricular response now converted      back to normal sinus rhythm.  2. Coronary artery disease status post prior myocardial infarction and      prior coronary artery bypass surgery.  3. Hypertension.  4. History of colon cancer status post two prior resections in 1980      and 1999 with chemotherapy and radiation site.  5. History of stricture at the anastomotic site.  6. Cardiomyopathy with reduced EF at 40% without any congestive heart      failure type symptoms.  7. Status post cholecystectomy.  8. Chronic iron deficiency anemia.  9. History of occult lower gastrointestinal bleed status post      colonoscopy in October 2007.  10.History of low back shingles.   MEDICATION LIST:  1. Lopressor XL 50 mg p.o. b.i.d.  2. Altace 5 mg p.o. daily.  3. Aspirin 325 p.o. daily.  4. Multivitamin one p.o. daily.  5. Ocuvite.  6. Amiodarone 400 mg p.o. b.i.d.  7. Digoxin 0.25 mg p.o. q.a.m.  8. Tandem versus Nu-Iron 150 mg p.o. q.a.m.  9. Paregoric as directed versus Lomotil p.r.n.  10.FiberCon daily.  11.Probiotics as directed.   DISCHARGE PROCEDURES:  1. Telemetry monitoring.  2. Initiation of amiodarone.  3. Cardiology consult with [name] [name].  4. 2D echocardiogram with results revealing left ventricular systolic      function moderately decreased with ejection fraction estimated to      be 40%.  There was diffuse left ventricular hypokinesis noted and      akinesis of the posterolateral  wall.  There was moderate mitral      valve regurgitation.  The left atrium was markedly dilated.  The      estimated peak left ventricular systolic pressure was moderately to      markedly increased.  There was moderate tricuspid valvular      regurgitation, and the right atrium was moderately dilated.  There      was no echocardiogram evidence for cardiac source of embolism.  5. Pulmonary function test with the FVC being 75% of predicted, FEV1      being 86% of predicted, and FEV1/FVC ratio at 105% of predicted.      The actual FVC was 3 L.  The diffusion capacity was normal.  The      conclusion was that the PFT's were consistent with left heart      failure or shunting.  There was no restrictive or obstructive lung      disease noted.  6. A  2 unit packed red blood cell transfusion.  7. IV iron infusion.   BRIEF HISTORY OF PRESENT ILLNESS:  [name] [name] is a very pleasant  77 year old man with history of myocardial infarction, coronary disease,  coronary artery bypass grafting who presented to medical attention in my  office on July 14 while I was on call for dizziness and weakness.  He  reports a story of feeling fine on Thursday.  He watched TV.  He worked  on reports.  He went upstairs and as he went up the stairs he could not  move and he could not breathe.  He sat down on the side of the bed.  He  felt better, but had a diaphoretic episode.  He had no chest pain except  for some chest tightness.  He took some nitroglycerin without any help.  He was trying to make it to see [name] [name] the following Thursday when  he had a physical.  Unfortunately [name] [name] was out of town, and  because he could not make it any longer his wife called up and got him  an appointment to see me to see if there is anything else going on.  He  struggled through the weekend with just declining energy and some  episodes of shortness of breath.  On arrival into the office his blood  pressure was on  the lower side.  I do not believe that the blood  pressure was accurate, but his heart rate was pretty irregular.  EKG was  done and confirmed atrial fibrillation with rapid ventricular response.  He also used this opportunity to do his annual physical exam labs, and  labs were able to be reviewed at the time that he was in the office.  Hemoglobin was 9.6.  My initial thought based on the lower blood  pressures were that he had symptomatic anemia, hypotension, and other  types of symptoms, but after the EKG came back and confirmed atrial  fibrillation with RVR with heart rate of 150 he was immediately admitted  to the hospital for further evaluation and treatment.   HOSPITAL COURSE:  For the atrial fibrillation with rapid ventricular  response he was admitted to telemetry bed.  He was loaded on IV digoxin.  Placed on Cardizem and a Cardizem drip.  Continued on the Toprol and  initially placed on Lovenox.  [name] [name] saw him in the hospital and started him immediately on  amiodarone.  A long discussion was had as to whether Coumadin was a good  idea, and eventually was determined to be a decent idea, and it was  started carefully.  After about 30 something hours on the amiodarone and  the above treatment, he converted back to normal sinus rhythm where he  has been for the last 24+ hours.  Because [name] hemoglobin  drifted down and needed a packed red blood cell transfusion, the risk-  benefit reward ratio of Coumadin was continued to be discussed  throughout the whole hospital day.  Dr. [name] on the day of discharge  felt and I concurred that the risk of Coumadin at this moment was too  great, although the 2D echocardiogram is certainly not normal and puts  him at higher risk of recurrent atrial fibrillation.  The role of  Coumadin will need to be addressed and paid attention to as an  outpatient.  After he was taken off the Cardizem drip, he was put on  oral Cardizem.  This  too has been discontinued for fear of future  bradycardic issues and events, and his current medication list includes  only the digoxin, Lopressor, and amiodarone.  [name] [name] will follow up  the patient in about one week, and Dr. [name] will follow up the patient  in two weeks.  EKGs probably should be obtained at both times.  He is  feeling very well.  He is not having any symptoms, no chest pain, no  shortness of breath, no dizziness.  He is ready for discharge on June 14, 2007.  Dr. [name] also got PFTs, and these were unremarkable.   For his cardiomyopathy with reduced EF he currently does not have any  underlying symptoms and will continue on his current medication list and  continue on the ACE inhibitor.   For his cardiovascular risk despite the atrial fibrillation, although  back in normal sinus rhythm at this moment, he will be discontinued on  aspirin at this point, and Coumadin needs to be addressed throughout  time.   For his chronic iron deficiency anemia he did receive a test dose  followed by full dose of IV iron, and unfortunately his hemoglobin  continued to trend down to the point where his hemoglobin was 8.1.  He  received 2 units of packed red blood cells after this point, and his  hemoglobin on the date of discharge is 11.1.  His hemoglobin will need  to be followed carefully as an outpatient.  His iron levels will also  need to be followed carefully as an outpatient and with presumed further  IV iron infusions.   He has had thorough GI workups in the past, and he will be continued on  over the counter Prilosec.   His blood pressure remained fine throughout the hospital stay.  He had  no evidence of hypotension and no evidence of hypertension.   He has impaired fasting glucose, but does not have any underlying  diabetes at this moment.  He will need to continue exercise and control  his weight.   On June 14, 2007 Mr. Adger was seen on morning rounds.  He  remained in  normal sinus rhythm.  His physical exam did not have any changes.  His  blood pressure was 129/77, satting 95% on room air.  It was felt that he  was okay for discharge home.  On his current labs digoxin level was 1.  Sodium 137, potassium 3.4, chloride 105, bicarb 28, BUN 12, creatinine  1.03, glucose 92.  White count 9.8, hemoglobin 11.1, platelet count 202.  CK's and troponins throughout the hospital stay were all negative.  TSH  was done on July 15 and was 0.68 and normal.  A1c was done on July 14  and was 5.7.  His ferritin was low at 13.  Total iron binding capacity  was 460 and high.  His iron level was 211, but I believe this was after  he received the IV iron.   AFTERCARE AND FOLLOWUP INSTRUCTIONS:  He will follow up with [name] [name]  and Dr. [name] as stated above.  He will go home later today.      Precious Reel, MD  Electronically Signed     JMR/MEDQ  D:  06/14/2007  T:  06/14/2007  Job:  [id number]

## 2011-04-12 NOTE — Assessment & Plan Note (Signed)
[facility] OFFICE NOTE   [name] [name], [name] [name]                       MRN:          [id number]  DATE:03/03/2008                            DOB:          06-09-1932    CHIEF COMPLAINT:  Follow-up after hospitalization for GI bleeding.   [name] [name] was hospitalized for recurrent melena after I could not find  a cause with EGD and colonoscopy.  He had a subsequent EGD by Dr. Val Riles which was negative again.  A CT enterography demonstrates some  thickening of his ileum just above his ileocolonic anastomosis.  Dr.  Olevia Perches performed a colonoscopy and dilated the ileostenosis and was able  to see ulceration in the distal ileum.  Biopsies were benign and did not  reveal a cause, though it is thought perhaps ischemia or perhaps  radiation.  He has done well without any further melena since hospital  discharge.  He did receive transfusions when he was hospitalized.   Five units total, hemoglobin stabilized in the 10 range.   MEDICATIONS:  Medications listed and reviewed in the chart.  He has  discontinued his aspirin.   PAST MEDICAL HISTORY:  Reviewed and unchanged otherwise.  Please refer  to the discharge summary of February 26, 2008 for further details.   PHYSICAL EXAMINATION:  VITAL SIGNS:  Weight 185 pounds, pulse 56, blood  pressure 130/74.   ASSESSMENT:  1. Recurrent melena.  2. Chronic blood loss anemia in a man status post surgery and      radiation therapy.  He has had two colon cancers.  It appears that      he had ulcerations of the distal intestine causing this.  Etiology      is not clear.  It could be from radiation which he received      previously or perhaps ischemia related to anastomotic issues or      something.  At this point he seems okay without bleeding.  He is      off his aspirin.   RECOMMENDATIONS:  1. I think he is going to need to stay off the aspirin at least for      awhile.   Probably long term.  2. Should he develop recurrent significant bleeding and cannot keep up      with iron supplementation then I think surgery should be considered      to remove this area of ileum.  Additional thoughts include the      possibility of capsule endoscopy (not discussed with the patient      today), but he would have to be prepared to undergo surgery in case      it lodged.  3. He understands the possibility of recurrent problems but want to      take a wait and see approach.   We did recheck his CBC today.  I recommended he try some ferrous  gluconate.  He has required iron infusion in the past and may require  those again as there has been some intolerance to iron.     [name] [name].  Carlean Purl, MD,FACG  Electronically Signed    CEG/MedQ  DD: 03/03/2008  DT: 03/03/2008  Job #: [id number]   cc:   Elta Guadeloupe A. Perini, M.D.  Haywood Lasso, M.D.

## 2011-04-12 NOTE — H&P (Signed)
NAMEAntonietta [name] NO.:  [id number]   MEDICAL RECORD NO.:  [id number]           PATIENT TYPE:   LOCATION:                                 FACILITY:   PHYSICIAN:  Precious Reel, MD            DATE OF BIRTH:   DATE OF ADMISSION:  DATE OF DISCHARGE:                              HISTORY & PHYSICAL   PRIMARY CARE PHYSICIAN:  Mark A. Perini, M.D.   CARDIOLOGIST:  Peter M. [name], M.D.   CHIEF COMPLAINT:  Weakness, failure to thrive, and chest pressure.   HISTORY OF PRESENT ILLNESS:  This is an 77 year old male seen today for  dizziness and weakness.  He felt fine Thursday. He was watching some TV,  he worked on some reports. He went up the stairs but could not move and  could not breathe.  He sat on the side of the bed, had some chest  pressure that resolved.  He ended up feeling better and had a  diaphoresis episode.  The chest pain was described as tightness for  which he took nitroglycerin without any help.  He lay around Friday and  Saturday.  He did go dancing on Saturday and felt okay.  He did a lot of  slow dancing without any chest pain or shortness of breath.  He did get  dizzy afterwards.  Yesterday he just kind of lounged around, and this  morning he was continuing to have symptoms and decided to come in for  evaluation.  During the evaluation, his blood pressure was noted to be  low.  His pulse seemed to be erratic.  EKG was done and confirmed atrial  fibrillation with RVR.   PAST MEDICAL HISTORY:  1. Coronary artery disease.  2. History of myocardial infarction.  3. Angioplasty in 1984 and percutaneous intervention with stenting in      1999.  4. Four-vessel coronary artery bypass grafting in February 1982.  5. EF of 34% with cardiomyopathy.  6. Hypertension.  7. History of colon cancer status post partial resection in 1980 and      right hemicolectomy in January 2000 followed by radiation therapy.  8. History of severe mucositis on 5-FU.  9.  History of hernia repair.  10.History of low back surgery in 1998.  11.Hypertension.  12.Cholecystectomy in 1999.  13.History of vasectomy.  14.Iron-deficiency anemia with history of IV iron on long-term iron      shots.  15.History of borderline B12 level.  16.Impaired fasting glucose.  17.Diverticulosis.  18.Erectile dysfunction.   INTOLERANCES:  1. Certain types of IRON.  2. LESCOL gives him some dizziness.  3. VYTORIN gives him leg cramping.   No true allergies, and those medications cause intolerance.   MEDICATIONS:  1. Altace 5 mg daily.  2. Metoprolol 50 mg twice a day.  3. Aspirin 325 mg daily.  4. Centrum vitamins daily.  5. Fibercon 2 per day.  6. Lomotil 2 per day.  7. Tussionex as needed.  8. Paregoric 2 mg as needed.  9. Prilosec 20 mg daily.  10.Tandem Plus 1 per day,  11.Align probiotic supplements a day.  12.Anusol suppository as needed.   SOCIAL HISTORY:  He is married since 3 to [place], has 2 sons and 2  daughters, has some grandchildren. He is retired.  He quit cigarettes  40+ years ago, occasionally smokes a cigar.  Has some alcohol  occasionally.   FAMILY HISTORY:  Father died at the age of 58 of colon cancer.  Mother  died at the age of 59 of old age. There is diabetes in some brothers and  sisters.   REVIEW OF SYSTEMS:  Denies any fevers or chills.  Denies any headaches  or sore throat.  He has had some chest pain and has been weak and tired.  Denies any shortness of breath or dyspnea on exertion, but that is hard  to believe considering how fast his heart rate is.  Denies any urinary  symptoms.  Denies any weakness or numbness.  Denies any joint issues.  Denies any nausea, vomiting, or diarrhea.  He has some dark stool with  the iron and denies any other organ system issues.   PHYSICAL EXAMINATION:  VITAL SIGNS:  Pulse 150.  Weight 185.  Blood  pressure anywhere from 80/60 up to 104/65.  GENERAL:  He is alert and oriented and does not  appear ill.  EAR, NOSE, THROAT:  PERRLA.  EOMI.  Oropharynx is without erythema.  CARDIOVASCULAR:  Regular.  Sternotomy noted.  Irregularity noted.  PULMONARY:  Clear to auscultation bilaterally.  ABDOMEN:  Soft.  Abdominal scar noted.  EXTREMITIES:  No clubbing, no cyanosis, no edema.  NEUROLOGIC:  Intact.  Strength is appropriate.   ANCILLARY DATA:  EKG shows atrial fibrillation with RVR, heart rate  150s.   Colonoscopy done September 12, 2006, shows diverticulosis, hemorrhoids,  and small ulcer.   Biopsy report shows benign colon mucosa with histologic features  consistent with anastomosis site, no evidence of malignancy.   White count 7.3, hemoglobin 9.6, platelet count 276.  Glucose 128, BUN  26, creatinine 1.2, sodium 139, potassium 4.7, chloride 107, bicarb 26,  calcium 9.3, protein 6.4, albumin 3.8, AST 23, ALT 25, alkaline  phosphatase 39, total bilirubin 0.8.   ASSESSMENT:  This is a patient with symptomatic anemia with fatigue and  weakness and hypotension in a patient with an EKG done today which is  showing atrial fibrillation with rapid ventricular response.  He needs  admission for further evaluation and treatment.   PLAN:  1. For atrial fibrillation and rapid ventricular response, he will be      placed on Lovenox, but we need to be careful of truly      anticoagulating him, especially with his GI blood loss issues.  2. We will also be giving him IV digoxin and IV Cardizem, and I have      already consulted Dr. [name] for consideration of adenosine or      other types of medications and maybe even DC cardioversion.  He      will probably also need a 2-D echocardiogram.  3. Will rule out myocardial infarction.  4. Will control blood pressure and may need to back down on his blood      pressure medications.  5. For anemia, will give him an IV iron infusion and follow his blood      counts accordingly.  6. I do not believe that he needs an evaluation per Dr.  Lyla Son      at  this point, but will keep that in the back of our mind and      continue him on the iron and follow his blood counts appropriately      as stated above.  7. Control his diarrhea with paregoric and all the medications that he      normally takes.  8. A proton pump inhibitor has already been ordered.  9. CK and troponin have been ordered.  10.A1c has been ordered for his hypoglycemia.  11.Our goal here is clearly to start rate control and decide what his      long-term medications will be and then let Dr. [name] decide      whether he is going to work on rhythm control or not.  He clearly      needs a repeat of his 2-D echocardiogram at this moment.  With the      weird chest pressure issue, I am hoping it was just based on atrial      fibrillation with rapid ventricular response and low blood      pressure, not related to any angina, but he may need heart      catheterization should his symptoms not improved appropriately.      Precious Reel, MD  Electronically Signed     JMR/MEDQ  D:  06/11/2007  T:  06/11/2007  Job:  [id number]   cc:   Peter M. [name], M.D.  Clarene Reamer, MD

## 2011-04-12 NOTE — Assessment & Plan Note (Signed)
[facility] OFFICE NOTE   [name] [name], [name] [name]                       MRN:          [id number]  DATE:02/12/2008                            DOB:          March 14, 1932    CHIEF COMPLAINT:  Blood in stool.   REQUESTING PHYSICIAN:  [name] [name], M.D.   ASSESSMENT:  A 77 year old white man with a history of two prior colon  cancers who has been having blood in stool and melena symptoms for about  a week now. He discussed this with Dr. Joylene Draft, and he was referred over  for further evaluation.   Etiology of the melena is not clear. He has had ulceration at his  anastomotic area in the right colon, possibility of recurrent colonic  malignancy is certainly possible or perhaps a more proximal GI bleeding  source is possible. Plan will be to check CBC today, and he will have a  colonoscopy tomorrow. Depending upon the results of that, an upper GI  endoscopy could be indicated. Given that he has had stenosis at his  ileocolonic anastomosis, a small-bowel capsule endoscopic study might  not be practical if it came to that. The patient has had an underlying  iron-deficiency anemia of unclear etiology for years.   HISTORY:  [name] [name] is 28 and has the problems as described above. He  says he has very dark, black stools. They sit in the commode, and he  sees some darker red coloration to them after that. In the last day or  so, this has cleared up it seems. He just had an iron transfusion a  couple of weeks ago. He has been maintained on intravenous iron for  years, it sounds like. He denies any abdominal pain or other change in  bowel habits. No nausea or vomiting reported. He has not had chest pain,  respiratory difficulty, lightheadedness, or significant weakness. I do  not think there has been any weight fluctuation. He has had bleeding  like this before but in the past, bleeding from hemorrhoids which has  been bright  red in color.   MEDICATIONS:  1. Iron infusion intermittently.  2. Metoprolol 25 mg b.i.d.  3. Altace daily.  4. Aspirin 325 mg daily.  5. Metamucil daily.  6. Amiodarone daily.  7. Multivitamin daily.  8. Vitamin E daily.   There are no known drug allergies.   PAST MEDICAL HISTORY:  1. Atrial fibrillation converted back to sinus rhythm.  2. Coronary artery disease with prior myocardial infarction.  3. Prior coronary artery bypass grafting.  4. Hypertension.  5. Colon cancer with two resections, in 1980 and 1999. He has had      chemotherapy and radiation apparently.  6. Anastomotic stricture at ileocolonic anastomosis.  7. Cardiomyopathy, ejection fraction 40%.  8. Prior cholecystectomy.  9. Chronic recurrent iron-deficiency anemia.  10.History of herpes zoster in the low back.  11.Prior hernia repairs.  12.Chronic recurrent iron-deficiency anemia, etiology not known.  13.Prior low back surgery 1998.  14.Hypertension.  15.Prior vasectomy.  16.History of borderline vitamin B 12 level.  17.Diverticulosis.  18.Erectile  dysfunction.   Though there are no drug allergies, he is intolerant of iron. Lescol has  given him dizziness and Vytorin caused leg cramps.   He has been on aspirin, but he did stop that on his own recently.   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 He enjoys singing country music, and he  smokes cigars.   FAMILY HISTORY:  One son age 59 has had a colonoscopy two or three  times. No cancer. A daughter is 56; she has not yet had a colonoscopy.  His father had colon cancer as well. I do not know if there are other  relatives, does not seem to be.   REVIEW OF SYSTEMS:  For GI, constitutional, heart, and respiratory, as  above.   PHYSICAL EXAMINATION:  Reveals a well-developed, elderly, white man.  Weight 181 pounds. Pulse 60,  blood pressure 112/58, pulse was regular,  respirations were 18 and unlabored.  EYES:  Bilateral arcus, otherwise anicteric. Pupils round and reactive  to light. Mouth:  Posterior pharynx free of lesions, no telangiectasias.  The neck is supple without thyromegaly or mass. No cervical or  supraclavicular adenopathy.  The lungs are clear.  HEART:  S1 and S2. No murmurs, rubs, or gallops. No rhythm and rate.  The abdomen is soft, nontender, well healed surgical scars. There is no  organomegaly or mass. Bowel sounds are present.  RECTAL EXAM:  Shows formed brown stool that is markedly heme positive.  There are no obvious hemorrhoids palpable or visible. The prostate is  slightly firm but without nodule.  LOWER EXTREMITIES:  There is trace peripheral edema in the right lower  extremity, none in the left. There is no cyanosis or clubbing.  NEUROLOGICAL:  Cranial nerves II-XII intact. Grossly nonfocal.  PSYCHIATRIC:  Appropriate mood and affect. Alert and oriented x3.   I appreciate the opportunity to care for this patient.     Gatha Mayer, MD,FACG  Electronically Signed    CEG/MedQ  DD: 02/12/2008  DT: 02/12/2008  Job #: [id number]   cc:   Elta Guadeloupe A. [name], M.D.  Haywood Lasso, M.D.

## 2011-04-12 NOTE — Discharge Summary (Signed)
NAMEHORACE, [name] [name] NO.:  [id number]   MEDICAL RECORD NO.:  [id number]          PATIENT TYPE:  OIB   LOCATION:  [id number]                         FACILITY:  [facility]   PHYSICIAN:  [name] Grayer, MD       DATE OF BIRTH:  1932/08/17   DATE OF ADMISSION:  09/30/2008  DATE OF DISCHARGE:  10/01/2008                               DISCHARGE SUMMARY   CARDIOLOGIST:  [name] M. [name], MD   PRIMARY CARE PHYSICIAN:  [name] How. Perini, MD   ALLERGIES:  This patient has no known drug allergies.   FINAL DIAGNOSES:  1. Discharging day 1 status post implant of a Medtronic VIRTUOSO DR      dual-chamber cardioverter defibrillator with defibrillator      threshold study less than or equal to 15 joules.  2. Ischemic cardiomyopathy, ejection fraction of 30%.  3. New York Heart Association class II chronic systolic congestive      heart failure.   SECONDARY DIAGNOSES:  1. Coronary artery disease status post coronary artery bypass graft in      2001.  2. History of colon cancer with resections in 1980 and 1999.  He had      previous chemotherapy and radiation therapy.  3. Iron deficiency anemia.  4. Hypertension.  5. Dyslipidemia.  6. History of herniorrhaphy.   PROCEDURES:  September 30, 2008, implant of the Medtronic dual-chamber  cardioverter defibrillator by Dr. Thompson [name].  The patient had no  postprocedural complications.  The device was interrogated on  postprocedure day #1 and all values are within normal limits.  Chest x-  ray shows that the leads are in appropriate position and no pneumothorax  and no evidence of congestive failure.   BRIEF HISTORY:  [name] is a 77 year old male seen in consultation  for Dr. [name] by Dr. Thompson [name].  The patient has a history of  coronary artery disease.  He has undergone bypass surgery in 2001.  He  has ischemic cardiomyopathy.  His ejection fraction is 30%.  He does  well, however, his New York Heart Association is class II  chronic  systolic congestive heart failure.   The recent stress test showed good exercise tolerance.  He had an  extensive inferolateral infarct, no ischemia.  The patient's ejection  fraction remained at 30%.   The patient's risk profile was a patient with increased risk for sudden  cardiac death.  He meets criteria for implantation of ICD based on the  SCD/HFT trial.  The patient does have an intraventricular conduction  delay but this is not suggestive of significant dyssynchrony.   The risks and benefits of implantation of cardioverter defibrillator  have been discussed with the patient and he was understands these risks  and wishes to proceed.  This will be done at the first convenient  opportunity.   HOSPITAL COURSE:  The patient presents electively on September 30, 2008.  Implantation of the Medtronic VIRTUOSO device, a dual-chamber  cardioverter-defibrillator was effected without complication.  The  patient is discharging postprocedure day #1.  He goes home on his  preoperative medications which are,  1. Prilosec 20 mg daily.  2. Metoprolol 50 mg tablets 1/2 tablet in the morning and 1/2 tablet      in the evening.  3. Amiodarone 200 mg daily.  4. Furosemide 40 mg daily.  5. Potassium chloride 20 mEq every other day.  6. Pravastatin 20 mg daily at bedtime.  7. Ramipril 5 mg daily.  8. Vitamin D 1000 units daily.  9. Multivitamin daily.   DISCHARGE INSTRUCTIONS:  He is asked to keep his incision dry for the  next 7 days.  Mobility of the left arm has been described to the  patient.   FOLLOWUP:  He has a followup with [facility], [address] [place].  1. [facility], Thursday, October 16, 2008 at 9.  2. To see Dr. Rayann Heman, January 06, 2009 at 9.   LABORATORY STUDIES:  Pertinent to this admission were drawn on September 24, 2008.  White cells 5.4, hemoglobin 13.2, hematocrit 39.1, and  platelets of 204.  Protime was 10.7.  INR 0.9.  Sodium 143,  potassium  4.2, chloride 105, carbonate 34, glucose 99, BUN of 16, and creatinine  1.1.      Sueanne Margarita, [place]      [name] Grayer, MD  Electronically Signed    GM/MEDQ  D:  10/01/2008  T:  10/01/2008  Job:  [id number]   cc:   [name] [name], M.D.  [name] [name], M.D.

## 2011-04-15 NOTE — Assessment & Plan Note (Signed)
[facility] OFFICE NOTE   [name], [name]                       MRN:          [id number]  DATE:09/06/2006                            DOB:          [birth year]    A patient I have taken care of for a number of years, found several colon  cancers and he has been resected twice for this. One was in the left colon,  one was in the right colon. He has a family history of his father having  colon cancer. [name] [name] has done real well since. His last colonoscopic  examination is in 2004, he only has a half of a colon. At that time, we saw  a solitary ulcer in the rectum which appeared to be a stercoral ulcer and  otherwise there was no significant problem. I suggested he have a followup  in 4 years. [name] [name] has had other medical problems including a myocardial  infarction, congestive heart failure, hypertension and previous hernia  repair. He is on multiple medications including metoprolol, Altace, baby  aspirin, Metamucil and multivitamins. He has been having over the past  several weeks blood in his stools. He thinks it is from hemorrhoids, it is  bright red. He sees it with wiping and in the bowl itself. He has had some  abdominal pain at times with some nausea and vomiting and diarrhea. He says  when all of this is cleared out of his GI tract he feels better. He says he  has not had any blood in the last several days and when he last saw Dr.  Joylene Draft several days previous he was found to have a hemoglobin of 8.9 and on  September 17 it was 12.5. Dr. Joylene Draft ordered some followup blood tests to be  more specific and help [place] determine the exact degree of his iron deficiency.  He has taken iron in the past. He also said that on his physical examination  with Dr. Joylene Draft yesterday he had slightly positive stool and it was brown in  color.   SOCIAL HISTORY:  Reveals he is still working. He says he likes working. He  does  not drink or smoke.   REVIEW OF SYSTEMS:  Noncontributory at this time.   PHYSICAL EXAMINATION:  GENERAL:  Somewhat pale.  VITAL SIGNS:  6 feet, weighs 186, blood pressure 124/60, pulse 64 and  regular.  NECK/UPPER EXTREMITIES:  All unremarkable.  ABDOMEN:  __________  soft. There are no masses or organomegaly.  RECTAL:  Deferred since it was done the day previously. He said his stools  were more negative today.   IMPRESSION:  1. Lower gastrointestinal bleeding of questionable etiology, rule out      hemorrhoidal versus stercoral ulcer versus recurrent occult malignancy.  2. History of gastroesophageal reflux disease.  3. Arteriosclerotic cardiovascular disease status post coronary artery      bypass graft.  4. Hypertension.   RECOMMENDATIONS:  Put him on Prevacid 30 mg b.i.d. for several weeks. I gave  him some iron to see if he could tolerate the samples. He says he  has a  difficult time taking p.o. iron and needs infusions. I scheduled him for a  colonoscopic examination next week. He says he is stable, he feels good and  I think he can wait for this procedure satisfactorily unless there is  further evidence of significant bleeding and I told him to call us  immediately if this occurred.   COMMENT:  I did not talk to him about his family history. His father had  colon cancer and he had 2 cancers in his colon. He said he had a brother  that had polyps and I told him that I thought that his daughter whose in her  late [id number] should certainly have a colonoscopic examination and his son he  says has already had one and he thinks polyps were found. I also suggested  maybe he needed genetic testing to see if there is a genetic predisposition  for cancer in his family, that could be important to him and his other  family members.            ______________________________  Clarene Reamer, MD      SML/MedQ  DD:  09/06/2006  DT:  09/08/2006  Job #:  [id number]   cc:   Elta Guadeloupe A.  Perini, M.D.

## 2011-04-15 NOTE — Procedures (Signed)
[facility]  Patient:    [name] [name], [name] [name]                       MRN: [id number] Adm. Date:  [id number] Disc. Date: [id number] Attending:  [name], Peter Manning CC:         Clarene Reamer, M.D. [facility]  Peter M. [name], M.D.  Crist Infante, M.D.   Procedure Report  PROCEDURE:  Upper endoscopy.  ENDOSCOPIST:  Lowella Bandy. Olevia Perches, M.D.  INDICATIONS:  This 77 year old white male presents with iron deficiency anemia, Hemoccult-positive stool, hemoglobin of 8 g, history of colonic resection for malignancy, last one in 1999.  He has been having chronic diarrhea.  Patients medications have included aspirin 325 mg a day.  This was discontinued approximately three weeks ago.  He was put on oral iron but has had poor tolerance to iron supplements, causing more diarrhea and abdominal discomfort.  Iron saturation have been low at 8%.  He was just started on Nexium 40 mg a day one week ago.  He is undergoing upper endoscopy to further evaluate his Hemoccult-positive stools as well as his iron deficiency anemia.  ENDOSCOPE:  Olympus single-channel videoscope.  SEDATION:  Versed 10 mg IV.  FINDINGS:  Olympus single-channel videoscope passed directly into the posterior pharynx and into the esophagus.  Patient was monitored by pulse oximetry; his oxygen saturations were normal.  He was cooperative.  Proximal and distal esophageal mucosa was unremarkable.  There were no erosions, fibrosis or any mucosal lesions to suggest GI blood loss.  Squamocolumnar junction was normal.  Stomach:  Stomach was insufflated with air and showed normal-appearing gastric folds in the body of the stomach and gastric antrum.  There were no erosions. Pyloric outlet was normal.  Retroflexion of endoscope revealed normal fundus and cardia.  Biopsies were taken from gastric antrum for CLOtest; biopsies were also taken from greater curvature of the stomach to rule out gastric atrophy.  Duodenum:   Duodenal bulb and descending duodenum were unremarkable.  Biliary papilla was visualized briefly and showed normal size and shape.  Endoscope was then retracted and retroflexed, fundus and cardia examined for a retroflexed view and appeared normal.  Patient tolerated the procedure well.  IMPRESSION:  Normal upper endoscopy of esophagus, stomach and duodenum, status post CLOtest and biopsies.  PLAN:  There is no evidence of GI blood loss from upper GI tract.  Repeat rectal exam today showed Hemoccult-negative stool.  Patients anemia may be related to aspirin-induced GI blood loss.  Patient has been off aspirin for three weeks and I would suggest that he stay off it for the moment until his blood count returns to normal.  Because of the previous history of malignancy and unexplained GI blood loss, small-bowel follow-through as well as colonoscopy are to be completed.  Patient has an intolerance to oral iron and therefore, consideration will be given to iron infusion of 1000 mg of InFeD as an outpatient.  He is to continue on the Nexium 40 mg a day until he follows up with Dr. Clarene Reamer in the office. DD:  11/23/00 TD:  11/23/00 Job: [id number]

## 2011-04-15 NOTE — Assessment & Plan Note (Signed)
[facility] OFFICE NOTE   [name], [name]                       MRN:          [id number]  DATE:10/09/2006                            DOB:          [birth year]    [name] [name] comes in asking about the results of his studies.  I went over  with him and his wife in detail about his CT findings.  There was some  slight concern about thickening of the anastomotic area and a small-bowel  series also somewhat difficult to assess completely unfortunately.  There  was still some question that this could be an underlying malignancy;  however, I really doubt this.  I really think that this is a obstructive  component secondary to anastomosis and scarring as a result of inflammation.  I did talk to him about how severe this obstructive component was and how he  best could deal with it.  Under the circumstances, I told him that I thought  ultimately surgery would probably be indicated, that we should start on the  iron, and then a CT scan to be obtained in 3 months.  In the meantime, CBC,  serum iron, TIBC be obtained by Dr. Joylene Draft in the next 3-4 weeks as well.  If he continues getting along satisfactorily, especially during the  holidays, I think that we could probably continue as is.  I am concerned  about again the severity of the stenosis and how long this can continue  without him having significant symptoms, but I think he is cognizant of how  he should respond with small frequent feedings, liquid diet for several days  if he starts noting certain of these symptoms.  I told him we would follow  him very closely, and I think he was happy and relieved to realized this was  not a recurrent cancer.     Clarene Reamer, MD  Electronically Signed    SML/MedQ  DD: 10/09/2006  DT: 10/09/2006  Job #: [id number]   cc:   Elta Guadeloupe A. Perini, M.D.

## 2011-04-15 NOTE — Discharge Summary (Signed)
[place]. [facility]  Patient:    [name] [name], [name] [name]                       MRN: [id number] Adm. Date:  [id number] Disc. Date: 01/12/01 Attending:  Montine Circle Dictator:   Ricki Miller, P.A.C. CC:         Peter M. [name], M.D.             Crist Infante, M.D.                           Discharge Summary  DATE OF BIRTH:  02-20-1932.  HISTORY OF PRESENT ILLNESS:  This is a 77 year old male with a history of coronary artery disease.  He had a multivessel angioplasty in 1984 by Richard A. Rollene Fare, M.D.  In December of 1999, he underwent a stress Cardiolite prior to colon resection.  This was abnormal with marked inferior ischemia. Cardiac catheterization at that time showed a complex 99% proximal circumflex stenosis and occlusion of the right coronary artery. The circumflex coronary artery was stented.  The patient underwent radical hemicolectomy in January of 2000.  Postoperatively, he suffered an inferolateral myocardial infarction. Since then, he has had continued frequent exertional anginal symptoms with periodic use of nitroglycerin.  Repeat stress test in November of 2000, showed limited exercise tolerance walking 5-1/2 minutes with anginal symptoms and ST changes.  Cardiolite images showed marked ischemia involving the lateral and inferior walls.  Ejection fraction was reduced to 34%.  The patient notes since restarting isosorbide, his degree of anginal symptoms has decreased. Cardiac catheterization was performed November 07, 2000, by Peter M. [name], M.D., and showed three vessel coronary artery disease.  He was seen in consultation by Lilia Argue. Servando Snare, M.D., who evaluated the patient and his studies and agreed with recommendations for proceeding with coronary artery bypass grafting and he was admitted this hospitalization for the procedure.  PAST MEDICAL HISTORY: 1. Coronary artery disease as above. 2. Status post inferior myocardial  infarction. 3. Congestive heart failure. 4. History of hypertension. 5. History of colon cancer, status post colectomy in 1990, status post right    hemicolectomy in January of 2000, followed by radiation therapy and    chemotherapy.  Chemotherapy was stopped early because of side effects. 6. History of hernia repair.  CARDIAC RISK FACTORS:  The patient has a longstanding history of hypertension. He has been a smoker of two cigars a day, no cigarettes, x 40 years.  ALLERGIES:  No known allergies, although TALWIN did cause confusion.  MEDICATIONS ON ADMISSION: 1. Altace 5 mg a day. 2. Lopressor 50 mg b.i.d. 3. Isosorbide 30 mg once a day. 4. Lomotil 2.5 mg p.r.n. for loose stools. 5. Multivitamin one q.d. 6. FiberCon.  For family history, social history, review of systems and physical examination, please see the history and physical done at the time of admission.  HOSPITAL COURSE:  The patient was admitted electively and taken to the operating room on January 08, 2001, where he underwent the following procedure: Coronary artery bypass grafting x 4.  The following grafts were placed: 1. Left internal mammary artery to the LAD. 2. Saphenous vein graft to the posterior descending. 3. Saphenous vein graft to the circumflex. 4. Saphenous vein graft to the diagonal.  The patient tolerated the procedure well and was taken to the surgical intensive care unit in stable condition.  POSTOPERATIVE HOSPITAL COURSE:  The patient has done well.  He has maintained stable hemodynamics.  He did have postoperative atrial fibrillation but was cardioverted by chemical means and is now in normal sinus rhythm.  He is tolerating routine cardiac rehabilitation phase I modalities.  Oxygen has been weaned and he maintains good saturations on room air.  He has undergone a gentle diuresis, although will require some further as an outpatient.  His laboratory values are stable.  BUN and creatinine are  within normal limits. Hemoglobin and hematocrit show a moderate anemia with measures of 9.1 and 27.5, respectively.  He has been started on folic acid.  Currently the patient is quite stable.  His incisions are healing well without signs of infection.  He is afebrile.  He is tolerating his diet and other activities commensurate for level of postoperative convalescence.  He is currently felt to be quite stable for tentative discharge in the morning of January 12, 2001, pending morning round reevaluation.  CONDITION ON DISCHARGE:  Stable and improved.  FINAL DIAGNOSES: 1. Coronary artery disease. 2. Status post inferior myocardial infarction. 3. Congestive heart failure. 4. Colon cancer, status post right hemicolectomy, radiation treatment and    partial chemotherapy. 5. History of hernia repair. 6. Hypertension. 7. Hyperlipidemia. 8. Postoperative atrial fibrillation, no normal sinus rhythm.  MEDICATIONS ON DISCHARGE: 1. Lopressor 50 mg b.i.d. 2. Altace 5 mg q.d. 3. Enteric coated aspirin 325 mg q.d. 4. Folic acid 1 mg q.d. 5. Digoxin 0.125 mg q.d. 6. Tylox one to two q.4-6h. p.r.n.  FOLLOW-UP: 1. Edward B. Servando Snare, M.D., in three weeks. 2. Peter M. [name], M.D., in two weeks.  DISCHARGE INSTRUCTIONS:  The patient will receive written instructions in regard to medications, activity, diet, wound care and follow-up. DD:  01/11/01 TD:  01/12/01 Job: [id number]

## 2011-04-15 NOTE — Op Note (Signed)
[place]  Patient:    [name] [name], [name] [name]                       MRN: [id number] Proc. Date: 01/08/01 Adm. Date:  [id number] Attending:  Montine Circle CC:         Peter M. [name], M.D.   Operative Report  PREOPERATIVE DIAGNOSIS:  Coronary occlusive disease with depressed left ventricular function.  POSTOPERATIVE DIAGNOSIS:  Coronary occlusive disease with depressed left ventricular function.  OPERATION PERFORMED:  Coronary artery bypass grafting x 4 with left internal mammary artery to the left anterior descending coronary artery, reversed saphenous vein graft to the diagonal coronary artery, reversed saphenous vein to the circumflex coronary artery, reversed saphenous vein graft to the posterior descending coronary artery.  SURGEON:  Lilia Argue. Servando Snare, M.D.  ASSISTANT:  Earnstine Regal, P.A.  ANESTHESIA:  General.  INDICATIONS FOR PROCEDURE:  The patient is a 77 year old male who presented with positive stress test to Dr. Mare Ferrari and Dr. Peter [name].  Cardiac catheterization was performed which demonstrated depressed left ventricular function consistent with the known inferolateral myocardial infarction he had after colon resection for carcinoma of the colon 18 months ago.  In addition, he had progression of disease in the LAD and diagonal with 70% stenoses.  The right coronary artery was totally occluded as was the circumflex.  Left ventricular function was depressed with overall ejection fraction approximately 40%.  Because of the patients depressed left ventricular function, positive stress test and three-vessel coronary artery disease, coronary artery bypass grafting was recommended.  The patient agreed and signed informed consent.  DESCRIPTION OF PROCEDURE:  With Swan-Ganz and arterial line monitors in place, the patient underwent general endotracheal anesthesia without incident.  The skin of the chest and legs was  prepped with Betadine and draped in the usual sterile manner.  Vein was harvested from the right lower extremity and was of adequate quality and caliber.  Median sternotomy was performed and the left internal mammary artery was dissected out as a pedicle graft.  The distal artery was divided and had good free flow.  The pericardium was opened. Overall ventricular fibrillation appeared preserved.  The patient was systemically heparinized.  The ascending aorta and the right atrium were cannulated in the aortic root.  A bent cardioplegia needle was introduced into the ascending aorta.  The patient was placed on cardiopulmonary bypass at 2.4 L per minute per meter squared.  Sites for anastomosis were selected and dissected out of the epicardium.  The patients body temperature was then cooled to 30 degrees.  The aortic crossclamp was applied.  500 cc of cold blood potassium cardioplegia was administered with rapid diastolic arrest of the heart.  Myocardial and septal temperature was monitored throughout the crossclamp period.  Attention was turned first to the distal right coronary artery which was totally occluded.  The posterior descending coronary artery was identified and patent.  The vessel was opened and admitted a 1.5 mm probe distally.  Using running 7-0 Prolene a distal anastomosis was performed with a segment of reversed saphenous vein graft.  Attention was then turned to the circumflex system which was opened and admitted a 1.5 mm probe.  Using a running 7-0 Prolene, distal anastomosis was performed.  The first diagonal coronary artery was then opened and admitted a 1.5 mm probe.  Using a segment of reversed saphenous vein graft, the distal anastomosis was performed. Additional cold blood cardioplegia was administered  down the vein graft. Attention was then turned to the left anterior descending coronary artery which was opened between the mid and distal third of the vessel.  A 1.5  mm probe passed proximally and distally.  Using a running 8-0 Prolene, the left internal mammary artery was anastomosed to the left anterior descending coronary artery.  With release of the Edwards bulldog on the mammary artery, there was appropriate rise in myocardial septal temperature.  The aortic cross clamp was removed.  Total cross clamp time was 51 minutes.  The patient required electrical defibrillation to return to a sinus rhythm.  A partial occlusion clamp was placed on the ascending aorta.  Three punch aortotomies were performed.  Each of the three vein grafts were anastomosed to the ascending aorta.  Air was evacuated from the grafts.  The partial occlusion clamp was removed.  Sites of anastomoses were inspected and free of bleeding. The patient was then ventilated and weaned from cardiopulmonary bypass without difficulty.  He remained hemodynamically stable and was decannulated in the usual fashion.  Protamine sulfate.  With the operative field hemostatic, two atrial and two ventricular pacing wires were applied.  Graft markers were applied.  A left pleural tube and two mediastinal tubes were left in place. The pericardium was loosely reapproximated.  The sternum was closed with #6 stainless steel wire, the fascia was closed with interrupted 0 Vicryl and running 3-0 Vicryl in the subcutaneous tissue and 4-0 subcuticular stitch for the skin edges.  Dry dressings were applied.  The sponge and needle counts were reported as correct at completion of the procedure.  The patient tolerated the procedure without complications.  He was transferred to the surgical intensive care unit for further postoperative care. DD:  01/09/01 TD:  01/10/01 Job: [id number]

## 2011-04-15 NOTE — Cardiovascular Report (Signed)
[place]  Patient:    [name] [name], [name] [name]                       MRN: [id number] Proc. Date: 11/07/00 Adm. Date:  [id number] Disc. Date: [id number] Attending:  [name], Peter Manning CC:         Crist Infante, M.D.  Cath Lab   Cardiac Catheterization  INDICATIONS:  The patient is a 77 year old male status post previous inferolateral myocardial infarction who presents with refractory anginal symptoms and an early positive stress Cardiolite study.  ACCESS:  Via the right femoral artery using the standard Seldinger technique.  EQUIPMENT:  6 French 4 cm right and left Judkins catheters, 6 French pigtail catheter, 6 French arterial sheath.  MEDICATIONS:  Local anesthesia with 1% Xylocaine, nitroglycerin sublingual x 1.  CONTRAST:  130 cc of Omnipaque.  HEMODYNAMIC DATA:  Aortic pressure 114/62, left ventricular pressure 112, with EDP of 16 mmHg.  ANGIOGRAPHIC DATA:  The left coronary artery arises and distributes normally. It is diffusely calcified.  The left main coronary artery is without significant disease.  The left anterior descending artery is a large vessel that extends out around the apex.  There is 50% narrowing in the proximal LAD and again another 50% lesion at the takeoff of the first diagonal branch.  The first diagonal branch is a moderate size vessel.  The left circumflex is heavily calcified.  At the site of previously stented segment in the mid left circumflex coronary artery, the vessel is totally occluded.  There are left to left collaterals to the distal circumflex which comprises three obtuse marginal vessels.  The right coronary artery arises normally.  It is occluded in the proximal vessel.  There are right to right collaterals to the mid right coronary artery with left to right collaterals to the distal right coronary artery and posterior descending artery.  The left ventricular angiography is performed in the RAO view.   This demonstrates enlarged left ventricular chamber with moderate global hypokinesia.  There was severe hypokinesia of the inferior wall.  There is no significant mitral insufficiency.  Ejection fraction is calculated at 32%.  FINAL INTERPRETATION: 1. Severe three vessel obstructive atherosclerotic coronary vascular disease. 2. Moderate left ventricular dysfunction.  DD:  11/08/00 TD:  11/08/00 Job: [id number]

## 2011-06-23 ENCOUNTER — Encounter: Payer: Medicare PPO | Admitting: *Deleted

## 2011-06-28 ENCOUNTER — Encounter: Payer: Self-pay | Admitting: *Deleted

## 2011-06-30 ENCOUNTER — Encounter: Payer: Self-pay | Admitting: Cardiology

## 2011-07-04 ENCOUNTER — Encounter: Payer: Self-pay | Admitting: Cardiology

## 2011-07-04 ENCOUNTER — Ambulatory Visit (INDEPENDENT_AMBULATORY_CARE_PROVIDER_SITE_OTHER): Payer: Medicare PPO | Admitting: Cardiology

## 2011-07-04 DIAGNOSIS — I519 Heart disease, unspecified: Secondary | ICD-10-CM

## 2011-07-04 DIAGNOSIS — I4891 Unspecified atrial fibrillation: Secondary | ICD-10-CM

## 2011-07-04 DIAGNOSIS — I2510 Atherosclerotic heart disease of native coronary artery without angina pectoris: Secondary | ICD-10-CM

## 2011-07-04 NOTE — Progress Notes (Signed)
[name] Date of Birth: [birth year]   History of Present Illness: [name] is seen today for followup. He has been doing very well. We do note that he has an 8 pound weight loss but he reports that his appetite has been good. He denies any symptoms of chest pain, shortness of breath, or palpitations. He has had no defibrillator discharges. He does walk his dog every day. He also works out some at [employer].  Current Outpatient Prescriptions on File Prior to Visit  Medication Sig Dispense Refill  . amiodarone (PACERONE) 200 MG tablet Take 100 mg by mouth daily.        Marland Kitchen amLODipine (NORVASC) 5 MG tablet Take 5 mg by mouth daily.        . cholecalciferol (VITAMIN D) 1000 UNITS tablet Take 1,000 Units by mouth daily.        . diphenoxylate-atropine (LOMOTIL) 2.5-0.025 MG per tablet Take 2 tablets by mouth as needed.       . furosemide (LASIX) 40 MG tablet Take 40 mg by mouth daily.        . metoprolol (LOPRESSOR) 50 MG tablet Take 25 mg by mouth 2 (two) times daily.        . Multiple Vitamin (MULTIVITAMIN) tablet Take 1 tablet by mouth daily.        Marland Kitchen omeprazole (PRILOSEC OTC) 20 MG tablet Take 20 mg by mouth daily.        . Pitavastatin Calcium (LIVALO) 4 MG TABS Take by mouth daily. 1/2 Tablet 3 Times a Week      . potassium chloride SA (K-DUR,KLOR-CON) 20 MEQ tablet Take 20 mEq by mouth every other day.        . psyllium (REGULOID) 0.52 G capsule Take 0.52 g by mouth 2 (two) times daily.        . ramipril (ALTACE) 5 MG capsule Take 5 mg by mouth daily.        . pravastatin (PRAVACHOL) 20 MG tablet Take 20 mg by mouth daily.          Allergies  Allergen Reactions  . Talwin     Past Medical History  Diagnosis Date  . Atrial fibrillation     controlled with amiodarone, not a coumadin candidate due to GI bleeding  . CAD (coronary artery disease)     prior MI  . HTN (hypertension)   . Colon cancer     s/p chemo, RT, sugery  . Colorectal anastomotic stricture   . Cardiomyopathy      s/p ICD 09/30/08  . Iron deficiency anemia   . Diverticulosis   . ED (erectile dysfunction)   . Chronic systolic dysfunction of left ventricle   . Myocardial infarction of inferolateral wall     Past Surgical History  Procedure Date  . Vasectomy   . Colon resection     x2  . Coronary artery bypass graft   . Cholecystectomy   . Back surgery   . Hernia repair   . Cardiac defibrillator placement 09/30/08    by Greggory Brandy  . Cardiac catheterization 11/07/2000    EF 32%  . US echocardiography 08/11/2008    EF 30-35%  . Cardiovascular stress test 08/06/2008    EF 30%    History  Smoking status  . Current Everyday Smoker  . Types: Cigars  Smokeless tobacco  . Not on file    History  Alcohol Use No    Family History  Problem Relation Age  of Onset  . Colon cancer Father   . Diabetes Sister   . Colon cancer Brother     Review of Systems: As noted in history of present illness.  All other systems were reviewed and are negative.  Physical Exam: BP 130/74  Pulse 60  Ht 5\' 11"  (1.803 m)  Wt 167 lb 3.2 oz (75.841 kg)  BMI 23.32 kg/m2 He is an elderly white male in no acute distress. His HEENT exam is unremarkable. Pupils are round and reactive. Oropharynx is clear. Neck is supple no JVD, adenopathy, thyromegaly, or bruits. Lungs are clear. Cardiac exam reveals a regular rate and rhythm without gallop. He has a grade 2/6 systolic murmur at the left lower sternal border. Abdomen is soft and nontender with multiple old surgical scars. He has no masses or bruits. Extremities are without edema. Pedal pulses are palpable. He is alert and oriented x3. Cranial nerves II through XII are intact. LABORATORY DATA: Date Apr 11, 2011 his total cholesterol 141, triglycerides 75, HDL 58, LDL 68, apolipoprotein B.is 49. Liver function studies are normal.  Assessment / Plan:

## 2011-07-04 NOTE — Assessment & Plan Note (Signed)
He appears to be maintaining sinus rhythm well. He is due for a ICD check. He will continue on his current dose of amiodarone at 100 mg daily.

## 2011-07-04 NOTE — Assessment & Plan Note (Signed)
He is asymptomatic. His last nuclear stress testing September 2009 showed an anterior lateral infarct with no ischemia. Ejection fraction was 30%. We will continue on his current antianginal therapy and risk factor modification.

## 2011-07-04 NOTE — Patient Instructions (Signed)
Continue your current medication.  I will see you again in 6 months.   

## 2011-07-04 NOTE — Assessment & Plan Note (Signed)
He is euvolemic. He is on Lasix, metoprolol, and Altace. We will continue with sodium restriction.

## 2011-07-05 ENCOUNTER — Encounter: Payer: Self-pay | Admitting: Internal Medicine

## 2011-07-05 ENCOUNTER — Other Ambulatory Visit: Payer: Self-pay

## 2011-07-05 ENCOUNTER — Ambulatory Visit (INDEPENDENT_AMBULATORY_CARE_PROVIDER_SITE_OTHER): Payer: Medicare PPO | Admitting: *Deleted

## 2011-07-05 DIAGNOSIS — I509 Heart failure, unspecified: Secondary | ICD-10-CM

## 2011-07-05 DIAGNOSIS — I428 Other cardiomyopathies: Secondary | ICD-10-CM

## 2011-07-05 DIAGNOSIS — I519 Heart disease, unspecified: Secondary | ICD-10-CM

## 2011-07-07 LAB — REMOTE ICD DEVICE
AL AMPLITUDE: 3.1221 mv
ATRIAL PACING ICD: 39.27 pct
BAMS-0001: 170 {beats}/min
BATTERY VOLTAGE: 3.11 V
DEV-0020ICD: NEGATIVE
PACEART VT: 0
RV LEAD AMPLITUDE: 19.079 mv
TOT-0002: 0
TOT-0006: 20091103000000
TZAT-0001ATACH: 1
TZAT-0001ATACH: 2
TZAT-0001ATACH: 3
TZAT-0001FASTVT: 1
TZAT-0002ATACH: NEGATIVE
TZAT-0002ATACH: NEGATIVE
TZAT-0002SLOWVT: NEGATIVE
TZAT-0012FASTVT: 200 ms
TZAT-0012SLOWVT: 200 ms
TZAT-0018ATACH: NEGATIVE
TZAT-0018ATACH: NEGATIVE
TZAT-0018FASTVT: NEGATIVE
TZAT-0019ATACH: 6 V
TZAT-0019ATACH: 6 V
TZAT-0019ATACH: 6 V
TZAT-0019FASTVT: 8 V
TZAT-0020ATACH: 1.5 ms
TZAT-0020ATACH: 1.5 ms
TZAT-0020FASTVT: 1.5 ms
TZAT-0020SLOWVT: 1.5 ms
TZON-0003ATACH: 350 ms
TZON-0004SLOWVT: 16
TZON-0004VSLOWVT: 28
TZST-0001ATACH: 5
TZST-0001ATACH: 6
TZST-0001FASTVT: 2
TZST-0001FASTVT: 4
TZST-0002FASTVT: NEGATIVE
TZST-0002SLOWVT: NEGATIVE
TZST-0002SLOWVT: NEGATIVE

## 2011-07-11 NOTE — Progress Notes (Signed)
ICD remote with ICM 

## 2011-07-19 ENCOUNTER — Encounter: Payer: Self-pay | Admitting: *Deleted

## 2011-08-22 LAB — PREPARE RBC (CROSSMATCH)

## 2011-08-22 LAB — CROSSMATCH

## 2011-08-22 LAB — HEMOGLOBIN AND HEMATOCRIT, BLOOD
HCT: 24.5 — ABNORMAL LOW
HCT: 29.2 — ABNORMAL LOW
HCT: 30.4 — ABNORMAL LOW
HCT: 32.8 — ABNORMAL LOW
HCT: 33.2 — ABNORMAL LOW
HCT: 34.7 — ABNORMAL LOW
Hemoglobin: 10 — ABNORMAL LOW
Hemoglobin: 11.6 — ABNORMAL LOW
Hemoglobin: 8.3 — ABNORMAL LOW
Hemoglobin: 8.7 — ABNORMAL LOW
Hemoglobin: 9.3 — ABNORMAL LOW
Hemoglobin: 9.4 — ABNORMAL LOW

## 2011-08-22 LAB — CEA: CEA: 1.2

## 2011-08-22 LAB — BASIC METABOLIC PANEL
BUN: 15
Calcium: 8.4
Chloride: 109
Creatinine, Ser: 1.34
GFR calc Af Amer: >60
GFR calc non Af Amer: >60
Potassium: 4.3
Sodium: 142

## 2011-08-22 LAB — IRON AND TIBC
Saturation Ratios: 9 — ABNORMAL LOW
TIBC: 298

## 2011-08-22 LAB — DIFFERENTIAL
Basophils Absolute: 0
Lymphs Abs: 0.5 — ABNORMAL LOW
Monocytes Absolute: 0.5
Monocytes Relative: 9

## 2011-08-22 LAB — CBC
HCT: 29.7 — ABNORMAL LOW
Hemoglobin: 10 — ABNORMAL LOW
MCHC: 32.8
MCV: 85.1
MCV: 86.9
Platelets: 153
Platelets: 240
WBC: 4.9
WBC: 5

## 2011-08-22 LAB — SAMPLE TO BLOOD BANK

## 2011-08-22 LAB — ABO/RH: ABO/RH(D): O POS

## 2011-08-29 DIAGNOSIS — Z4502 Encounter for adjustment and management of automatic implantable cardiac defibrillator: Secondary | ICD-10-CM

## 2011-08-29 HISTORY — DX: Encounter for adjustment and management of automatic implantable cardiac defibrillator: Z45.02

## 2011-09-12 LAB — COMPREHENSIVE METABOLIC PANEL
ALT: 22
Alkaline Phosphatase: 32 — ABNORMAL LOW
BUN: 22
CO2: 27
Chloride: 108
GFR calc non Af Amer: >60
Glucose, Bld: 100 — ABNORMAL HIGH
Potassium: 4
Sodium: 141
Total Bilirubin: 0.6
Total Protein: 5.1 — ABNORMAL LOW

## 2011-09-12 LAB — CBC
HCT: 26.1 — ABNORMAL LOW
HCT: 34.3 — ABNORMAL LOW
HCT: 34.4 — ABNORMAL LOW
Hemoglobin: 10.3 — ABNORMAL LOW
Hemoglobin: 11.1 — ABNORMAL LOW
Hemoglobin: 8.1 — ABNORMAL LOW
MCHC: 32.1
MCHC: 32.3
MCV: 77 — ABNORMAL LOW
MCV: 77.6 — ABNORMAL LOW
Platelets: 214
RBC: 3.49 — ABNORMAL LOW
RDW: 20.2 — ABNORMAL HIGH
RDW: 20.6 — ABNORMAL HIGH
WBC: 8.8

## 2011-09-12 LAB — CROSSMATCH

## 2011-09-12 LAB — PROTIME-INR: Prothrombin Time: 16 — ABNORMAL HIGH

## 2011-09-12 LAB — BASIC METABOLIC PANEL
BUN: 12
Chloride: 105
GFR calc non Af Amer: >60
Glucose, Bld: 92
Potassium: 3.4 — ABNORMAL LOW

## 2011-09-12 LAB — CARDIAC PANEL(CRET KIN+CKTOT+MB+TROPI)
CK, MB: 3.1
CK, MB: 3.5
Total CK: 94
Troponin I: 0.05

## 2011-09-12 LAB — TSH: TSH: 0.683

## 2011-09-12 LAB — ABO/RH: ABO/RH(D): O POS

## 2011-09-13 LAB — I-STAT 8, (EC8 V) (CONVERTED LAB)
BUN: 26 — ABNORMAL HIGH
Chloride: 107
Glucose, Bld: 89
Hemoglobin: 12.2 — ABNORMAL LOW
Potassium: 4.3
Sodium: 140

## 2011-09-13 LAB — IRON AND TIBC: UIBC: 249

## 2011-09-13 LAB — POCT I-STAT CREATININE: Creatinine, Ser: 1.1

## 2011-09-13 LAB — CK TOTAL AND CKMB (NOT AT ARMC): Total CK: 142

## 2011-09-13 LAB — FERRITIN: Ferritin: 13 — ABNORMAL LOW (ref 22–322)

## 2011-09-13 LAB — HEMOGLOBIN A1C: Hgb A1c MFr Bld: 5.7

## 2011-10-02 ENCOUNTER — Inpatient Hospital Stay (HOSPITAL_COMMUNITY)
Admission: EM | Admit: 2011-10-02 | Discharge: 2011-10-06 | DRG: 291 | Disposition: A | Discharge status: Home / Self Care | Payer: Medicare PPO | Attending: Internal Medicine | Admitting: Internal Medicine

## 2011-10-02 ENCOUNTER — Encounter (HOSPITAL_COMMUNITY): Payer: Self-pay | Admitting: *Deleted

## 2011-10-02 ENCOUNTER — Other Ambulatory Visit: Payer: Self-pay

## 2011-10-02 ENCOUNTER — Emergency Department (HOSPITAL_COMMUNITY): Payer: Medicare PPO

## 2011-10-02 DIAGNOSIS — I4891 Unspecified atrial fibrillation: Secondary | ICD-10-CM

## 2011-10-02 DIAGNOSIS — I2510 Atherosclerotic heart disease of native coronary artery without angina pectoris: Secondary | ICD-10-CM | POA: Insufficient documentation

## 2011-10-02 DIAGNOSIS — Z79899 Other long term (current) drug therapy: Secondary | ICD-10-CM

## 2011-10-02 DIAGNOSIS — J441 Chronic obstructive pulmonary disease with (acute) exacerbation: Secondary | ICD-10-CM

## 2011-10-02 DIAGNOSIS — E876 Hypokalemia: Secondary | ICD-10-CM | POA: Diagnosis present

## 2011-10-02 DIAGNOSIS — I10 Essential (primary) hypertension: Secondary | ICD-10-CM | POA: Diagnosis present

## 2011-10-02 DIAGNOSIS — Z951 Presence of aortocoronary bypass graft: Secondary | ICD-10-CM

## 2011-10-02 DIAGNOSIS — I509 Heart failure, unspecified: Secondary | ICD-10-CM

## 2011-10-02 DIAGNOSIS — I2589 Other forms of chronic ischemic heart disease: Secondary | ICD-10-CM | POA: Diagnosis present

## 2011-10-02 DIAGNOSIS — I252 Old myocardial infarction: Secondary | ICD-10-CM

## 2011-10-02 DIAGNOSIS — Z85038 Personal history of other malignant neoplasm of large intestine: Secondary | ICD-10-CM

## 2011-10-02 DIAGNOSIS — F17200 Nicotine dependence, unspecified, uncomplicated: Secondary | ICD-10-CM | POA: Diagnosis present

## 2011-10-02 DIAGNOSIS — J189 Pneumonia, unspecified organism: Secondary | ICD-10-CM | POA: Diagnosis present

## 2011-10-02 DIAGNOSIS — J9620 Acute and chronic respiratory failure, unspecified whether with hypoxia or hypercapnia: Secondary | ICD-10-CM

## 2011-10-02 DIAGNOSIS — IMO0001 Reserved for inherently not codable concepts without codable children: Secondary | ICD-10-CM

## 2011-10-02 DIAGNOSIS — D509 Iron deficiency anemia, unspecified: Secondary | ICD-10-CM | POA: Diagnosis present

## 2011-10-02 DIAGNOSIS — I5023 Acute on chronic systolic (congestive) heart failure: Principal | ICD-10-CM

## 2011-10-02 DIAGNOSIS — Z95810 Presence of automatic (implantable) cardiac defibrillator: Secondary | ICD-10-CM

## 2011-10-02 LAB — CBC
HCT: 38.8 % — ABNORMAL LOW (ref 39.0–52.0)
MCH: 31.6 pg (ref 26.0–34.0)
MCV: 93.7 fL (ref 78.0–100.0)
Platelets: 149 10*3/uL — ABNORMAL LOW (ref 150–400)
Platelets: 157 10*3/uL (ref 150–400)
RBC: 4.08 MIL/uL — ABNORMAL LOW (ref 4.22–5.81)
RBC: 4.14 MIL/uL — ABNORMAL LOW (ref 4.22–5.81)
RDW: 14.6 % (ref 11.5–15.5)
RDW: 14.6 % (ref 11.5–15.5)
WBC: 13.6 10*3/uL — ABNORMAL HIGH (ref 4.0–10.5)
WBC: 14.1 10*3/uL — ABNORMAL HIGH (ref 4.0–10.5)

## 2011-10-02 LAB — COMPREHENSIVE METABOLIC PANEL
Albumin: 3.5 g/dL (ref 3.5–5.2)
Alkaline Phosphatase: 50 U/L (ref 39–117)
BUN: 15 mg/dL (ref 6–23)
Creatinine, Ser: 1.07 mg/dL (ref 0.50–1.35)
Potassium: 3.4 mEq/L — ABNORMAL LOW (ref 3.5–5.1)
Total Protein: 6.7 g/dL (ref 6.0–8.3)

## 2011-10-02 LAB — PROTIME-INR
INR: 1.18 (ref 0.00–1.49)
Prothrombin Time: 15.2 seconds (ref 11.6–15.2)

## 2011-10-02 LAB — CREATININE, SERUM
GFR calc Af Amer: 76 mL/min — ABNORMAL LOW (ref >90)
GFR calc non Af Amer: 66 mL/min — ABNORMAL LOW (ref >90)

## 2011-10-02 LAB — BASIC METABOLIC PANEL
Calcium: 9.2 mg/dL (ref 8.4–10.5)
GFR calc Af Amer: 77 mL/min — ABNORMAL LOW (ref >90)
GFR calc non Af Amer: 66 mL/min — ABNORMAL LOW (ref >90)
Glucose, Bld: 102 mg/dL — ABNORMAL HIGH (ref 70–99)
Potassium: 3.3 mEq/L — ABNORMAL LOW (ref 3.5–5.1)
Sodium: 133 mEq/L — ABNORMAL LOW (ref 135–145)

## 2011-10-02 LAB — DIFFERENTIAL
Basophils Absolute: 0 10*3/uL (ref 0.0–0.1)
Eosinophils Absolute: 0 10*3/uL (ref 0.0–0.7)
Lymphocytes Relative: 2 % — ABNORMAL LOW (ref 12–46)
Lymphs Abs: 0.3 10*3/uL — ABNORMAL LOW (ref 0.7–4.0)
Neutrophils Relative %: 92 % — ABNORMAL HIGH (ref 43–77)

## 2011-10-02 LAB — TSH: TSH: 0.108 u[IU]/mL — ABNORMAL LOW (ref 0.350–4.500)

## 2011-10-02 LAB — CARDIAC PANEL(CRET KIN+CKTOT+MB+TROPI)
CK, MB: 2.5 ng/mL (ref 0.3–4.0)
Relative Index: INVALID (ref 0.0–2.5)
Troponin I: <0.3 ng/mL (ref <0.30)

## 2011-10-02 LAB — PRO B NATRIURETIC PEPTIDE: Pro B Natriuretic peptide (BNP): 3728 pg/mL — ABNORMAL HIGH (ref 0–450)

## 2011-10-02 MED ORDER — ZOLPIDEM TARTRATE 5 MG PO TABS: 5.0000 mg | ORAL_TABLET | Freq: Every evening | ORAL | Status: DC | PRN

## 2011-10-02 MED ORDER — PREDNISONE 10 MG PO TABS: 10.0000 mg | ORAL_TABLET | Freq: Every day | ORAL | Status: DC

## 2011-10-02 MED ORDER — VITAMIN D3 25 MCG (1000 UNIT) PO TABS
1000.0000 [IU] | ORAL_TABLET | Freq: Every day | ORAL | Status: DC
  Administered 2011-10-02 – 2011-10-06 (×5): 1000 [IU] via ORAL
  Filled 2011-10-02 (×10): qty 1

## 2011-10-02 MED ORDER — THERA M PLUS PO TABS
1.0000 | ORAL_TABLET | Freq: Every day | ORAL | Status: DC
  Administered 2011-10-02 – 2011-10-06 (×5): 1 via ORAL
  Filled 2011-10-02 (×5): qty 1

## 2011-10-02 MED ORDER — POTASSIUM CHLORIDE CRYS ER 20 MEQ PO TBCR
20.0000 meq | EXTENDED_RELEASE_TABLET | Freq: Two times a day (BID) | ORAL | Status: DC
  Administered 2011-10-02 – 2011-10-06 (×8): 20 meq via ORAL
  Filled 2011-10-02 (×9): qty 1

## 2011-10-02 MED ORDER — AMIODARONE HCL 100 MG PO TABS
100.0000 mg | ORAL_TABLET | Freq: Every day | ORAL | Status: DC
  Administered 2011-10-02: 100 mg via ORAL
  Filled 2011-10-02: qty 0.5
  Filled 2011-10-02: qty 1
  Filled 2011-10-02: qty 0.5

## 2011-10-02 MED ORDER — PSYLLIUM 95 % PO PACK
1.0000 | PACK | Freq: Every day | ORAL | Status: DC
  Administered 2011-10-02 – 2011-10-06 (×5): 1 via ORAL
  Filled 2011-10-02 (×5): qty 1

## 2011-10-02 MED ORDER — PREDNISONE 20 MG PO TABS
40.0000 mg | ORAL_TABLET | Freq: Every day | ORAL | Status: AC
  Administered 2011-10-03 – 2011-10-04 (×2): 40 mg via ORAL
  Filled 2011-10-02 (×2): qty 2

## 2011-10-02 MED ORDER — HEPARIN BOLUS VIA INFUSION
4000.0000 [IU] | Freq: Once | INTRAVENOUS | Status: AC
  Administered 2011-10-02: 4000 [IU] via INTRAVENOUS
  Filled 2011-10-02: qty 4000

## 2011-10-02 MED ORDER — ALPRAZOLAM 0.25 MG PO TABS
0.2500 mg | ORAL_TABLET | Freq: Two times a day (BID) | ORAL | Status: DC | PRN
  Administered 2011-10-04 – 2011-10-05 (×2): 0.25 mg via ORAL
  Filled 2011-10-02 (×2): qty 1

## 2011-10-02 MED ORDER — FUROSEMIDE 10 MG/ML IJ SOLN
80.0000 mg | Freq: Once | INTRAMUSCULAR | Status: AC
  Administered 2011-10-02: 80 mg via INTRAVENOUS

## 2011-10-02 MED ORDER — OMEPRAZOLE MAGNESIUM 20 MG PO TBEC: 20.0000 mg | DELAYED_RELEASE_TABLET | ORAL | Status: DC

## 2011-10-02 MED ORDER — AZITHROMYCIN 250 MG PO TABS
500.0000 mg | ORAL_TABLET | Freq: Once | ORAL | Status: AC
  Administered 2011-10-02: 500 mg via ORAL
  Filled 2011-10-02: qty 1

## 2011-10-02 MED ORDER — FUROSEMIDE 10 MG/ML IJ SOLN
40.0000 mg | Freq: Once | INTRAMUSCULAR | Status: DC
  Filled 2011-10-02: qty 8

## 2011-10-02 MED ORDER — SODIUM CHLORIDE 0.9 % IJ SOLN: 3.0000 mL | INTRAMUSCULAR | Status: DC | PRN

## 2011-10-02 MED ORDER — PANTOPRAZOLE SODIUM 20 MG PO TBEC
20.0000 mg | DELAYED_RELEASE_TABLET | Freq: Every day | ORAL | Status: DC
  Administered 2011-10-02 – 2011-10-06 (×5): 20 mg via ORAL
  Filled 2011-10-02 (×5): qty 1

## 2011-10-02 MED ORDER — MOXIFLOXACIN HCL 400 MG PO TABS
400.0000 mg | ORAL_TABLET | Freq: Every day | ORAL | Status: DC
  Administered 2011-10-03 – 2011-10-06 (×4): 400 mg via ORAL
  Filled 2011-10-02 (×4): qty 1

## 2011-10-02 MED ORDER — CEFTRIAXONE SODIUM 1 G IJ SOLR
1.0000 g | Freq: Once | INTRAMUSCULAR | Status: AC
  Administered 2011-10-02: 1 g via INTRAVENOUS
  Filled 2011-10-02: qty 10

## 2011-10-02 MED ORDER — ONDANSETRON HCL 4 MG/2ML IJ SOLN: 4.0000 mg | Freq: Four times a day (QID) | INTRAMUSCULAR | Status: DC | PRN

## 2011-10-02 MED ORDER — PITAVASTATIN CALCIUM 4 MG PO TABS: 2.0000 mg | ORAL_TABLET | ORAL | Status: DC

## 2011-10-02 MED ORDER — RAMIPRIL 5 MG PO CAPS
5.0000 mg | ORAL_CAPSULE | Freq: Every day | ORAL | Status: DC
  Administered 2011-10-02 – 2011-10-06 (×5): 5 mg via ORAL
  Filled 2011-10-02 (×5): qty 1

## 2011-10-02 MED ORDER — PREDNISONE 20 MG PO TABS
30.0000 mg | ORAL_TABLET | Freq: Every day | ORAL | Status: AC
  Administered 2011-10-05 – 2011-10-06 (×2): 30 mg via ORAL
  Filled 2011-10-02 (×2): qty 1

## 2011-10-02 MED ORDER — SODIUM CHLORIDE 0.9 % IV SOLN
INTRAVENOUS | Status: DC
  Administered 2011-10-02: 22:00:00 via INTRAVENOUS

## 2011-10-02 MED ORDER — HEPARIN SODIUM (PORCINE) 5000 UNIT/ML IJ SOLN
5000.0000 [IU] | Freq: Three times a day (TID) | INTRAMUSCULAR | Status: DC
  Administered 2011-10-02 – 2011-10-06 (×11): 5000 [IU] via SUBCUTANEOUS
  Filled 2011-10-02 (×14): qty 1

## 2011-10-02 MED ORDER — POTASSIUM CHLORIDE CRYS ER 20 MEQ PO TBCR
40.0000 meq | EXTENDED_RELEASE_TABLET | Freq: Once | ORAL | Status: AC
  Administered 2011-10-02: 40 meq via ORAL
  Filled 2011-10-02: qty 2

## 2011-10-02 MED ORDER — SODIUM CHLORIDE 0.9 % IJ SOLN
3.0000 mL | Freq: Two times a day (BID) | INTRAMUSCULAR | Status: DC
  Administered 2011-10-02 – 2011-10-05 (×7): 3 mL via INTRAVENOUS

## 2011-10-02 MED ORDER — IPRATROPIUM BROMIDE 0.02 % IN SOLN
0.5000 mg | Freq: Three times a day (TID) | RESPIRATORY_TRACT | Status: DC
  Administered 2011-10-02: 1 mg via RESPIRATORY_TRACT
  Administered 2011-10-03 – 2011-10-04 (×4): 0.5 mg via RESPIRATORY_TRACT
  Filled 2011-10-02 (×12): qty 2.5

## 2011-10-02 MED ORDER — LEVALBUTEROL HCL 1.25 MG/0.5ML IN NEBU
1.2500 mg | INHALATION_SOLUTION | RESPIRATORY_TRACT | Status: DC | PRN
  Filled 2011-10-02: qty 0.5

## 2011-10-02 MED ORDER — AMLODIPINE BESYLATE 5 MG PO TABS
5.0000 mg | ORAL_TABLET | Freq: Every day | ORAL | Status: DC
  Administered 2011-10-03 – 2011-10-06 (×4): 5 mg via ORAL
  Filled 2011-10-02 (×5): qty 1

## 2011-10-02 MED ORDER — LEVALBUTEROL HCL 1.25 MG/0.5ML IN NEBU
1.2500 mg | INHALATION_SOLUTION | Freq: Three times a day (TID) | RESPIRATORY_TRACT | Status: AC
  Administered 2011-10-02 – 2011-10-04 (×6): 1.25 mg via RESPIRATORY_TRACT
  Filled 2011-10-02 (×6): qty 0.5

## 2011-10-02 MED ORDER — HEPARIN (PORCINE) IN NACL 100-0.45 UNIT/ML-% IJ SOLN
950.0000 [IU]/h | INTRAMUSCULAR | Status: DC
  Administered 2011-10-02: 950 [IU]/h via INTRAVENOUS
  Filled 2011-10-02 (×3): qty 250

## 2011-10-02 MED ORDER — PSYLLIUM 0.52 G PO CAPS: 0.5200 g | ORAL_CAPSULE | Freq: Two times a day (BID) | ORAL | Status: DC

## 2011-10-02 MED ORDER — ACETAMINOPHEN 325 MG PO TABS: 650.0000 mg | ORAL_TABLET | ORAL | Status: DC | PRN

## 2011-10-02 MED ORDER — PREDNISONE 20 MG PO TABS
20.0000 mg | ORAL_TABLET | Freq: Every day | ORAL | Status: DC
  Filled 2011-10-02: qty 1

## 2011-10-02 MED ORDER — POTASSIUM CHLORIDE CRYS ER 20 MEQ PO TBCR
40.0000 meq | EXTENDED_RELEASE_TABLET | Freq: Two times a day (BID) | ORAL | Status: DC
  Administered 2011-10-02: 40 meq via ORAL
  Filled 2011-10-02: qty 2

## 2011-10-02 MED ORDER — SODIUM CHLORIDE 0.9 % IV SOLN: 250.0000 mL | INTRAVENOUS | Status: DC

## 2011-10-02 MED ORDER — DEXTROSE 5 % IV SOLN
INTRAVENOUS | Status: AC
  Filled 2011-10-02: qty 50

## 2011-10-02 MED ORDER — ASPIRIN 325 MG PO TABS
325.0000 mg | ORAL_TABLET | Freq: Every day | ORAL | Status: DC
  Administered 2011-10-02 – 2011-10-06 (×5): 325 mg via ORAL
  Filled 2011-10-02 (×5): qty 1

## 2011-10-02 MED ORDER — FUROSEMIDE 10 MG/ML IJ SOLN
40.0000 mg | Freq: Two times a day (BID) | INTRAMUSCULAR | Status: DC
  Administered 2011-10-02: 40 mg via INTRAVENOUS
  Filled 2011-10-02 (×3): qty 4

## 2011-10-02 NOTE — ED Notes (Signed)
Spoke with [employer] regarding pacemaker interrogation details. md informed of same, results to be faxed.

## 2011-10-02 NOTE — ED Notes (Signed)
PATIENT PRESENTS WITH DEFIB FIRING X 1 TIME. PT DOES HAVE NASAL AND CHEST CONGESTION NOTED AND HAS BEEN TAKING OTC COLD MEDICATIONS. PT STS HIS DEFIB HAS ONLY FIRED 1 OTHER TIME 2 YEARS AGO.

## 2011-10-02 NOTE — Progress Notes (Signed)
Pt placed on BIPAP 10/5 50% with a large face mask per MD order. Pt tolerating well at this time. RT will continue to monitor.

## 2011-10-02 NOTE — Progress Notes (Signed)
ANTICOAGULATION CONSULT NOTE - Initial Consult  Pharmacy Consult for Heparin  Indication: atrial fibrillation  Allergies  Allergen Reactions  . Talwin     Patient Measurements: Height: 5\' 11"  (180.3 cm) Weight: 175 lb (79.379 kg) IBW/kg (Calculated) : 75.3  Adjusted Body Weight: 79 kg  Vital Signs: Temp: 98.4 F (36.9 C) (11/04 1101) Temp src: Oral (11/04 1101) BP: 141/77 mmHg (11/04 1434) Pulse Rate: 83  (11/04 1434)  Labs:  Basename 10/02/11 1507 10/02/11 1506 10/02/11 1202  HGB -- -- 12.9*  HCT -- -- 38.2*  PLT -- -- 149*  APTT -- -- --  LABPROT -- -- 15.2  INR -- -- 1.18  HEPARINUNFRC -- -- --  CREATININE 1.07 -- 1.05  CKTOTAL -- 63 --  CKMB -- 2.5 --  TROPONINI -- <0.30 --   Estimated Creatinine Clearance: 61.6 ml/min (by C-G formula based on Cr of 1.07).  Medical History: Past Medical History  Diagnosis Date  . Atrial fibrillation     controlled with amiodarone, not a coumadin candidate due to GI bleeding  . CAD (coronary artery disease)     prior MI  . HTN (hypertension)   . Colon cancer     s/p chemo, RT, sugery  . Colorectal anastomotic stricture   . Cardiomyopathy     s/p ICD 09/30/08  . Iron deficiency anemia   . Diverticulosis   . ED (erectile dysfunction)   . Chronic systolic dysfunction of left ventricle   . Myocardial infarction of inferolateral wall   . AICD (automatic cardioverter/defibrillator) present     Medications: Lasix 40 mg IV BID, Aspirin 325 mg daily, KCL 40 mEq BID   Assessment: Artrial fibrilllation, noted not to be a Coumadin candidate. Patient reports hx of bleeding while on Coumadin several years ago.  Goal of Therapy:  Heparin level 0.3-0.7 units/ml   Plan:  Will begin Heparin with 4000 units IV bolus, then drip at 950 units/hr.  First level 6-8 hrs later.  Arty Baumgartner 10/02/2011,4:00 PM

## 2011-10-02 NOTE — ED Notes (Signed)
MD at bedside. 

## 2011-10-02 NOTE — ED Notes (Signed)
MD at bedside. Dr Jeffie Pollock at bedside with patient. bipap was placed and now taken off pt per dr bensimohn due to pt anxiety level. Pt placed back on nrb and ox 94 %. Conversing with ease with dr bensimohn. resp e/u

## 2011-10-02 NOTE — ED Notes (Signed)
Pt reports defib x 1 this am 1000. Denies any cp at this time or sob. spo2 82% on room air, spo2 90% on 2L [place]. ekg being done at triage.

## 2011-10-02 NOTE — ED Notes (Signed)
Pt placed on cardiac monitor, bp cuff, and pulse ox. pts O2 sats upon arrival in the room were 88% on 2L [place]. RN placed pt on 4L O2 [place], sats 90%.

## 2011-10-02 NOTE — ED Notes (Signed)
MD at bedside.Bunnie Domino with [facility] at bedside with patient.

## 2011-10-02 NOTE — ED Provider Notes (Addendum)
History     CSN: [id number] Arrival date & time: 10/02/2011 10:58 AM   First MD Initiated Contact with Patient 10/02/11 1124      Chief Complaint  Patient presents with  . Chest Pain    HPI the patient is a 77 year old male who presents today complaining of having his defibrillator fire. This happened at about 10:30. Patient does not have any chest pain or shortness of breath at this time. He does note that he has had a cough and cold the past 3 days. Patient has not had any nausea or vomiting. He denies any fevers. He does know that he's been taking dextromethorphan for that time.  He has no other associated or modifying factors.  Past Medical History  Diagnosis Date  . Atrial fibrillation     controlled with amiodarone, not a coumadin candidate due to GI bleeding  . CAD (coronary artery disease)     prior MI  . HTN (hypertension)   . Colon cancer     s/p chemo, RT, sugery  . Colorectal anastomotic stricture   . Cardiomyopathy     s/p ICD 09/30/08  . Iron deficiency anemia   . Diverticulosis   . ED (erectile dysfunction)   . Chronic systolic dysfunction of left ventricle   . Myocardial infarction of inferolateral wall   . AICD (automatic cardioverter/defibrillator) present     Past Surgical History  Procedure Date  . Vasectomy   . Colon resection     x2  . Coronary artery bypass graft   . Cholecystectomy   . Back surgery   . Hernia repair   . Cardiac defibrillator placement 09/30/08    by Greggory Brandy  . Cardiac catheterization 11/07/2000    EF 32%  . US echocardiography 08/11/2008    EF 30-35%  . Cardiovascular stress test 08/06/2008    EF 30%    Family History  Problem Relation Age of Onset  . Colon cancer Father   . Diabetes Sister   . Colon cancer Brother     History  Substance Use Topics  . Smoking status: Current Everyday Smoker    Types: Cigars  . Smokeless tobacco: Not on file  . Alcohol Use: No      Review of Systems  Constitutional: Negative.     HENT: Positive for congestion.   Eyes: Negative.   Respiratory: Positive for cough and shortness of breath.   Cardiovascular: Positive for chest pain.  Gastrointestinal: Negative.   Genitourinary: Negative.   Musculoskeletal: Negative.   Skin: Negative.   Neurological: Negative.   Hematological: Negative.   Psychiatric/Behavioral: Negative.   All other systems reviewed and are negative.    Allergies  Talwin  Home Medications   Current Outpatient Rx  Name Route Sig Dispense Refill  . AMIODARONE HCL 200 MG PO TABS Oral Take 100 mg by mouth daily.     Marland Kitchen AMLODIPINE BESYLATE 5 MG PO TABS Oral Take 5 mg by mouth daily.      Marland Kitchen VITAMIN D 1000 UNITS PO TABS Oral Take 1,000 Units by mouth daily.      Marland Kitchen DIPHENOXYLATE-ATROPINE 2.5-0.025 MG PO TABS Oral Take 2 tablets by mouth as needed. For diarrhea     . FUROSEMIDE 40 MG PO TABS Oral Take 40 mg by mouth daily.      Marland Kitchen METOPROLOL TARTRATE 50 MG PO TABS Oral Take 25 mg by mouth 2 (two) times daily.      Marland Kitchen METOPROLOL TARTRATE 50 MG PO TABS  Oral Take 50 mg by mouth 2 (two) times daily.      Creed Copper M PLUS PO TABS Oral Take 1 tablet by mouth daily.      Marland Kitchen OMEPRAZOLE MAGNESIUM 20 MG PO TBEC Oral Take 20 mg by mouth daily as needed. For acid reflux     . PITAVASTATIN CALCIUM 4 MG PO TABS Oral Take 2 mg by mouth See admin instructions. 1/2 Tablet 3 Times a Week    . PSYLLIUM 0.52 G PO CAPS Oral Take 0.52 g by mouth 2 (two) times daily.      Marland Kitchen RAMIPRIL 5 MG PO CAPS Oral Take 5 mg by mouth daily.      Marland Kitchen POTASSIUM CHLORIDE CRYS CR 20 MEQ PO TBCR Oral Take 20 mEq by mouth every other day.      Marland Kitchen PRAVASTATIN SODIUM 20 MG PO TABS Oral Take 20 mg by mouth daily.        BP 141/77  Pulse 83  Temp(Src) 98.4 F (36.9 C) (Oral)  Resp 36  Ht 5\' 11"  (1.803 m)  Wt 175 lb (79.379 kg)  BMI 24.41 kg/m2  SpO2 96%  Physical Exam  Nursing note and vitals reviewed. Constitutional: He is oriented to person, place, and time. He appears well-developed and  well-nourished. No distress.  HENT:  Head: Normocephalic and atraumatic.  Eyes: Conjunctivae and EOM are normal. Pupils are equal, round, and reactive to light.  Neck: Normal range of motion.  Cardiovascular: Normal rate, regular rhythm, normal heart sounds and intact distal pulses.  Exam reveals no gallop and no friction rub.   No murmur heard. Pulmonary/Chest: Effort normal. No respiratory distress. He has no wheezes. He has rales. He exhibits no tenderness.  Abdominal: Soft. Bowel sounds are normal. He exhibits no distension. There is no tenderness. There is no rebound and no guarding.  Musculoskeletal: Normal range of motion. He exhibits edema. He exhibits no tenderness.  Neurological: He is alert and oriented to person, place, and time. No cranial nerve deficit. He exhibits normal muscle tone. Coordination normal.  Skin: Skin is warm and dry. No rash noted.  Psychiatric: He has a normal mood and affect.    ED Course  Procedures (including critical care time)  CRITICAL CARE Performed by: Chauncy Passy   Total critical care time: 30  Critical care time was exclusive of separately billable procedures and treating other patients.  Critical care was necessary to treat or prevent imminent or life-threatening deterioration.  Critical care was time spent personally by me on the following activities: development of treatment plan with patient and/or surrogate as well as nursing, discussions with consultants, evaluation of patient's response to treatment, examination of patient, obtaining history from patient or surrogate, ordering and performing treatments and interventions, ordering and review of laboratory studies, ordering and review of radiographic studies, pulse oximetry and re-evaluation of patient's condition.  Date: 10/02/2011  Rate: 77  Rhythm: normal sinus rhythm  QRS Axis: normal  Intervals: QT prolonged  ST/T Wave abnormalities: nonspecific ST changes and   Conduction  Disutrbances:incomplete left bundle branch block  Narrative Interpretation:   Old EKG Reviewed: unchanged  Labs Reviewed  CBC - Abnormal; Notable for the following:    WBC 13.6 (*)    RBC 4.08 (*)    Hemoglobin 12.9 (*)    HCT 38.2 (*)    Platelets 149 (*)    All other components within normal limits  DIFFERENTIAL - Abnormal; Notable for the following:    Neutrophils  Relative 92 (*)    Neutro Abs 12.5 (*)    Lymphocytes Relative 2 (*)    Lymphs Abs 0.3 (*)    All other components within normal limits  BASIC METABOLIC PANEL - Abnormal; Notable for the following:    Sodium 133 (*)    Potassium 3.3 (*)    Glucose, Bld 102 (*)    GFR calc non Af Amer 66 (*)    GFR calc Af Amer 77 (*)    All other components within normal limits  PRO B NATRIURETIC PEPTIDE - Abnormal; Notable for the following:    BNP, POC 3728.0 (*)    All other components within normal limits  COMPREHENSIVE METABOLIC PANEL - Abnormal; Notable for the following:    Potassium 3.4 (*)    Glucose, Bld 109 (*)    GFR calc non Af Amer 65 (*)    GFR calc Af Amer 75 (*)    All other components within normal limits  PROTIME-INR  PROCALCITONIN  LACTIC ACID, PLASMA  CARDIAC PANEL(CRET KIN+CKTOT+MB+TROPI)  I-STAT TROPONIN I  TSH  HEPARIN LEVEL   Dg Chest 2 View  10/02/2011  *RADIOLOGY REPORT*  Clinical Data: Shortness of breath.  Defibrillator firing.  CHEST - 2 VIEW  Comparison: 11/30/2009 and 10/01/2008 radiographs.  Findings: The left subclavian AICD leads appear unchanged within the right atrium and right ventricle.  There is stable mild cardiomegaly status post CABG.  There are new right greater than left perihilar airspace opacities and small bilateral pleural effusions.  The osseous structures appear unchanged.  IMPRESSION:  1.  New right greater left air space opacities are associated with small bilateral pleural effusions and may reflect asymmetric edema or early pneumonia.  Radiographic followup recommended. 2.   Stable cardiomegaly and AICD lead placement.  Original Report Authenticated By: Vivia Ewing, M.D.     1. Acute heart failure   2. Defibrillator activation   3. Community acquired pneumonia       MDM  The patient was evaluated by myself. He denied any chest pain or shortness of breath. He did have his device interrogated and had been shocked at 1045. He also had atrial fibrillation in the past several days. Patient was not in atrial fibrillation on my exam. His EKG was unchanged. Troponin was not elevated. Patient did have an elevated white count. Chest x-ray was concerning for pulmonary edema versus early pneumonia with bilateral lower airway infiltrates. Patient was written for Rocephin as well as azithromycin. Patient was not febrile. He did receive Lasix as well. Later in the visit he reported that he had not been taking his Lasix for the past few days as he was afraid he would get dehydrated. He also had been taking dextromethorphan. Cardiology evaluated the patient. While in the ED the patient had more difficulty with oxygenation. He was no more short of breath than usual. Patient did have crackles at the bases persistently. BiPAP was utilized for his symptoms. Patient tolerated this initially. He felt much better this. He did have intolerance of this eventually. He was able to be placed on nonrebreather mask. This is more efficient given that the patient is a predominant mouth breather. I spoke to the hospitalist regarding admission. 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 He will be admitted to cardiology for further management.      Chauncy Passy, MD 10/02/11 1644  Chauncy Passy, MD 10/02/11 [phone]

## 2011-10-02 NOTE — ED Notes (Signed)
Patient is resting comfortably. 

## 2011-10-02 NOTE — ED Notes (Signed)
Family at bedside. 

## 2011-10-02 NOTE — ED Notes (Signed)
Pacemaker interogated, dr hunt aware.

## 2011-10-02 NOTE — H&P (Addendum)
CARDIOLOGY ADMIT NOTE  Patient ID: KAIYON BIRKE MRN: [id number] DOB/AGE: [birth year] 77 y.o.  Admit date: 10/02/2011 Primary Physician: Jerlyn Ly, MD, MD Primary [place] Reason for Consultation:Atrial Fib/AICD discharge Principal Problem:  *Atrial fibrillation Active Problems:  Automatic implantable cardioverter/defibrillator (AICD) activation  Chronic systolic dysfunction of left ventricle  HPI: Mr. Edmison is a  77 y/o male patient of Dr. [name] with known history as stated above. He is not a coumadin candidate. He has been experiencing cold-like symptoms for 4 days to include cough,congestion and fever. He has been taking Mucinex D for symptomatic relief.  He has not been taking lasix 40 mg daily as prescribed, because he only takes it when he feels it to be necessary, although prescribed as a daily dose. He had worsening symptoms for the last 24 hours, with increased work of breathing and wheezes. Non productive cough. No weight gain or edema. No CP.  He got up this morning later than his usual 6 am, around 10:30  am and went to the bathroom. He felt his defibrillator shock him.  His wife heard him react to this, but he refused to come to ER. She gave him one NTG, and called his daughter who convinced him to come in.    [employer] has interrogated device and confirmed that he had one discharge around 10:45 am.  There has been evidence of atiral fib for last few weeks which was converted with his ICD shock. No Optivol tracing available. CXR demonstrated asymmetric edema or early pneumonia.  We are asked to see patient for continued management of Afib and possible CHF. BNP on admission 3728.0  Since admission to ER he has received Lasix 80 mg IV and been placed on BIPAP. Respiratory status improved.  At baseline denies DOE and says he can walk his dog at least half mile 1-2x/day without limitation. Smokes 1 cigar/day x 60 yrs.   Review of systems complete and found to be negative  unless listed above   Past Medical History  Diagnosis Date  . Atrial fibrillation     controlled with amiodarone, not a coumadin candidate due to GI bleeding  . CAD (coronary artery disease)     prior MI  . HTN (hypertension)   . Colon cancer     s/p chemo, RT, sugery  . Colorectal anastomotic stricture   . Cardiomyopathy     s/p ICD 09/30/08  . Iron deficiency anemia   . Diverticulosis   . ED (erectile dysfunction)   . Chronic systolic dysfunction of left ventricle   . Myocardial infarction of inferolateral wall   . AICD (automatic cardioverter/defibrillator) present     Family History  Problem Relation Age of Onset  . Colon cancer Father   . Diabetes Sister   . Colon cancer Brother     History   Social History  . Marital Status: Married    Spouse Name: N/A    Number of Children: 2  . Years of Education: N/A   Occupational History  . retired    Social History Main Topics  . Smoking status: Current Everyday Smoker    Types: Cigars  . Smokeless tobacco: Not on file  . Alcohol Use: No  . Drug Use: No  . Sexually Active: Not on file   Other Topics Concern  . Not on file   Social History Narrative   Lives in [place] with spouse.  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    Past Surgical History  Procedure Date  . Vasectomy   . Colon resection     x2  . Coronary artery bypass graft   . Cholecystectomy   . Back surgery   . Hernia repair   . Cardiac defibrillator placement 09/30/08    by Greggory Brandy  . Cardiac catheterization 11/07/2000    EF 32%  . US echocardiography 08/11/2008    EF 30-35%  . Cardiovascular stress test 08/06/2008    EF 30%    ECHO 2008  Overall left ventricular systolic function was moderately         decreased. Left ventricular ejection fraction was estimated         to be 40 %. There was mild diffuse left ventricular         hypokinesis. akinesis of the posterolateral wall.   -  Aortic valve thickness was mildly increased. There was  trivial         aortic valvular regurgitation.   -  There was moderate mitral valvular regurgitation. The effective         orifice of mitral regurgitation by proximal isovelocity         surface area was 0.07 cm^2. The volume of mitral         regurgitation by proximal isovelocity surface area was 12 cc.   -  The left atrium was moderately dilated.   -  The estimated peak right ventricular systolic pressure was         moderately to markedly increased.   -  There was moderate tricuspid valvular regurgitation.   -  The right atrium was moderately dilated.  CABG 12/2000:  Coronary artery bypass grafting x 4 with left internal mammary artery to the left anterior descending coronary artery, reversed saphenous vein graft to the diagonal coronary artery, reversed saphenous vein to the circumflex coronary artery, reversed saphenous vein graft to the posterior descending coronary artery.   Physical Exam: Blood pressure 119/63, pulse 75, temperature 98.4 F (36.9 C), temperature source Oral, resp. rate 18, weight 170 lb (77.111 kg), SpO2 92.00%.   General: Well developed, well nourished, in no acute distress Head: Eyes PERRLA, No xanthomas.   Normal cephalic and atramatic  Lungs: Diffuse. bilateral rales, rhonchi and mild expiratory wheezes bilaterally. Heart: HRRR S1 S2 with 2/6 systolic murmur at the LSB.  Pulses are 2+ & equal.            No carotid bruit.Postive JVD at 10 cm. Postivie HJR.Marland Kitchen  No abdominal bruits. No femoral bruits. Abdomen: Bowel sounds are positive, abdomen soft and non-tender without masses or                  Hernia's noted. Msk:  Back normal, normal gait. Normal strength and tone for age. Extremities: No clubbing, cyanosis or edema.  DP +1 Neuro: Alert and oriented X 3. Psych:  Good affect, responds appropriately  Labs:   Lab Results  Component Value Date   WBC 13.6* 10/02/2011   HGB 12.9* 10/02/2011   HCT 38.2* 10/02/2011   MCV 93.6 10/02/2011   PLT 149*  10/02/2011     Lab 10/02/11 1202  NA 133*  K 3.3*  CL 98  CO2 22  BUN 16  CREATININE 1.05  CALCIUM 9.2  PROT --  BILITOT --  ALKPHOS --  ALT --  AST --  GLUCOSE 102*   Lab Results  Component Value Date   CKTOTAL 83 06/12/2007   CKMB 3.1 06/12/2007   TROPONINI  Value:  0.05        NO INDICATION OF MYOCARDIAL INJURY. 06/12/2007         Radiology: Dg Chest 2 View  1. New right greater left air space opacities are associated with small bilateral pleural effusions and may reflect asymmetric edema or early pneumonia. Radiographic followup recommended. 2. Stable cardiomegaly and AICD lead placement.   10/02/2011  *RADIOLOGY REPORT*  Clinical Data: Shortness of breath.  .   EKG: NR rate of 77 bpm with Incomplete LBBB, Nonspecific ST abnormality  ASSESSMENT:   1. A/C respiratory failure - multifactorial 2. A/C systolic HF 3. ICM - EF 30-35% by echo 9/09 by report 4. COPD flare 5. ICD shock        --inappropriate due to rapid AF 6. PAF       --maintained on coumadin       --in AF over past few weeks on ICD interrogation - converted with ICD shock      --intolerant of coumadin 7. CAD s/p CABG 8. Tobacco use, ongoing 9. Hypokalemia  PLAN/DISCUSSION:  Patient seen and examined with Jory Sims, NP. We discussed all aspects of the encounter. I agree with the assessment and plan as stated.    Suspect majority of his respiratory distress is likely due a/c chronic HF in setting of recent AF which was cardioverted with ICD shock. However, also appears to have component of bronchitis with recent fevers/chills. Will admit to stepdown for close monitoring and diuresis. Treat bronchitis with avelox, nebs and steroids. If CXR not clearing need to consider amio lung toxicity - though I think this is unlikely. Would hold metoprolol for now until respiratory status improves. Repeat echo. Will review ICD tracings with EP in the am and also try to access Optivol trends. Counseled on  need for smoking cessation. Dr. Perrinin's team notified of admission.

## 2011-10-03 ENCOUNTER — Inpatient Hospital Stay (HOSPITAL_COMMUNITY): Payer: Medicare PPO

## 2011-10-03 ENCOUNTER — Encounter (HOSPITAL_COMMUNITY): Payer: Self-pay | Admitting: Internal Medicine

## 2011-10-03 DIAGNOSIS — J189 Pneumonia, unspecified organism: Secondary | ICD-10-CM | POA: Diagnosis present

## 2011-10-03 DIAGNOSIS — I059 Rheumatic mitral valve disease, unspecified: Secondary | ICD-10-CM

## 2011-10-03 HISTORY — DX: Pneumonia, unspecified organism: J18.9

## 2011-10-03 LAB — BASIC METABOLIC PANEL
CO2: 27 mEq/L (ref 19–32)
Calcium: 9.4 mg/dL (ref 8.4–10.5)
Chloride: 96 mEq/L (ref 96–112)
Creatinine, Ser: 1.12 mg/dL (ref 0.50–1.35)
Glucose, Bld: 179 mg/dL — ABNORMAL HIGH (ref 70–99)

## 2011-10-03 LAB — CARDIAC PANEL(CRET KIN+CKTOT+MB+TROPI)
CK, MB: 2.3 ng/mL (ref 0.3–4.0)
CK, MB: 2.8 ng/mL (ref 0.3–4.0)
Relative Index: INVALID (ref 0.0–2.5)
Relative Index: 2.1 (ref 0.0–2.5)
Total CK: 99 U/L (ref 7–232)
Total CK: 108 U/L (ref 7–232)
Total CK: 103 U/L (ref 7–232)

## 2011-10-03 MED ORDER — FUROSEMIDE 10 MG/ML IJ SOLN
80.0000 mg | Freq: Two times a day (BID) | INTRAMUSCULAR | Status: DC
  Administered 2011-10-03 – 2011-10-04 (×3): 80 mg via INTRAVENOUS
  Filled 2011-10-03 (×5): qty 8

## 2011-10-03 MED ORDER — METOPROLOL TARTRATE 50 MG PO TABS
50.0000 mg | ORAL_TABLET | Freq: Two times a day (BID) | ORAL | Status: DC
  Administered 2011-10-03 – 2011-10-05 (×5): 50 mg via ORAL
  Filled 2011-10-03 (×6): qty 1

## 2011-10-03 MED ORDER — AMIODARONE HCL 200 MG PO TABS
200.0000 mg | ORAL_TABLET | Freq: Every day | ORAL | Status: DC
  Administered 2011-10-03 – 2011-10-05 (×3): 200 mg via ORAL
  Filled 2011-10-03 (×3): qty 1

## 2011-10-03 NOTE — Consult Note (Signed)
HPI [name] is referred today for evaluation of an ICD shock. The patient is a 77 year old man with known coronary artery disease status post myocardial infarction. He has a history of paroxysmal atrial fibrillation. He has a history of chronic systolic heart failure. Over the last several days he has had increasing dyspnea. He has had subjective fevers and chills. The patient received an ICD shock and subsequently came to the emergency room. Interrogation of his defibrillator demonstrates an initial irregular tachycardia which regularized and for which he was subsequently shocked. He is now returned to sinus rhythm. He denies medical or dietary noncompliance. Over the past several months, his dose of amiodarone has been decreased to a half tablet daily (100 mg). Previously, he had an ICD shock secondary to rapid atrial fibrillation. He had initially been on 400 mg a day of amiodarone but had developed difficulty with ambulation and gait. His dose was decreased and his symptoms improved.  The patient is no longer on Coumadin because of gastrointestinal bleeding.  Allergies  Allergen Reactions  . Talwin      Current Facility-Administered Medications  Medication Dose Route Frequency Provider Last Rate Last Dose  . 0.9 %  sodium chloride infusion   Intravenous Continuous Orene Desanctis, [place] 10 mL/hr at 10/02/11 2133    . acetaminophen (TYLENOL) tablet 650 mg  650 mg Oral Q4H PRN Jolaine Artist, MD      . ALPRAZolam Duanne Moron) tablet 0.25 mg  0.25 mg Oral BID PRN Jolaine Artist, MD      . amiodarone (PACERONE) tablet 200 mg  200 mg Oral Daily Peter [name], MD      . amLODipine (NORVASC) tablet 5 mg  5 mg Oral Daily Jolaine Artist, MD      . aspirin tablet 325 mg  325 mg Oral Daily Chauncy Passy, MD   325 mg at 10/02/11 1424  . azithromycin (ZITHROMAX) tablet 500 mg  500 mg Oral Once Chauncy Passy, MD   500 mg at 10/02/11 1423  . cefTRIAXone (ROCEPHIN) 1 g in dextrose 5 % 50 mL  IVPB  1 g Intravenous Once Chauncy Passy, MD   1 g at 10/02/11 1427  . cholecalciferol (VITAMIN D) tablet 1,000 Units  1,000 Units Oral Daily Jolaine Artist, MD   1,000 Units at 10/02/11 2135  . dextrose 5 % solution           . furosemide (LASIX) injection 80 mg  80 mg Intravenous Once Chauncy Passy, MD   80 mg at 10/02/11 1425  . furosemide (LASIX) injection 80 mg  80 mg Intravenous BID Peter [name], MD   80 mg at 10/03/11 0800  . heparin 100 units/mL bolus via infusion 4,000 Units  4,000 Units Intravenous Once Arty Baumgartner, RPH   4,000 Units at 10/02/11 1600  . heparin injection 5,000 Units  5,000 Units Subcutaneous Q8H Jolaine Artist, MD   5,000 Units at 10/03/11 [phone]  . ipratropium (ATROVENT) nebulizer solution 0.5 mg  0.5 mg Nebulization Q8H Jolaine Artist, MD   0.5 mg at 10/03/11 0501  . levalbuterol (XOPENEX) nebulizer solution 1.25 mg  1.25 mg Nebulization Q8H Jolaine Artist, MD   1.25 mg at 10/03/11 0501  . levalbuterol (XOPENEX) nebulizer solution 1.25 mg  1.25 mg Nebulization Q4H PRN Jolaine Artist, MD      . metoprolol (LOPRESSOR) tablet 50 mg  50 mg Oral BID Peter [name], MD      . moxifloxacin Leroy Libman)  tablet 400 mg  400 mg Oral Daily Jolaine Artist, MD      . multivitamins ther. w/minerals tablet 1 tablet  1 tablet Oral Daily Jolaine Artist, MD   1 tablet at 10/02/11 1845  . ondansetron (ZOFRAN) injection 4 mg  4 mg Intravenous Q6H PRN Jolaine Artist, MD      . pantoprazole (PROTONIX) EC tablet 20 mg  20 mg Oral Q1200 Jolaine Artist, MD   20 mg at 10/02/11 2140  . Pitavastatin Calcium TABS 2 mg  2 mg Oral See admin instructions Jolaine Artist, MD      . potassium chloride SA (K-DUR,KLOR-CON) CR tablet 20 mEq  20 mEq Oral BID Jolaine Artist, MD   20 mEq at 10/02/11 2149  . potassium chloride SA (K-DUR,KLOR-CON) CR tablet 40 mEq  40 mEq Oral Once Chauncy Passy, MD   40 mEq at 10/02/11 1423  . predniSONE (DELTASONE) tablet 40 mg  40 mg Oral  QAC breakfast Jolaine Artist, MD   40 mg at 10/03/11 [id number]   Followed by  . predniSONE (DELTASONE) tablet 30 mg  30 mg Oral QAC breakfast Jolaine Artist, MD       Followed by  . predniSONE (DELTASONE) tablet 20 mg  20 mg Oral QAC breakfast Jolaine Artist, MD       Followed by  . predniSONE (DELTASONE) tablet 10 mg  10 mg Oral QAC breakfast Jolaine Artist, MD      . psyllium (HYDROCIL/METAMUCIL) packet 1 packet  1 packet Oral Daily Jolaine Artist, MD   1 packet at 10/02/11 2144  . ramipril (ALTACE) capsule 5 mg  5 mg Oral Daily Jolaine Artist, MD   5 mg at 10/02/11 2142  . sodium chloride 0.9 % injection 3 mL  3 mL Intravenous Q12H Jolaine Artist, MD   3 mL at 10/02/11 2141  . sodium chloride 0.9 % injection 3 mL  3 mL Intravenous PRN Jolaine Artist, MD      . zolpidem (AMBIEN) tablet 5 mg  5 mg Oral QHS PRN Jolaine Artist, MD      . DISCONTD: 0.9 %  sodium chloride infusion  250 mL Intravenous Continuous Jolaine Artist, MD      . DISCONTD: amiodarone (PACERONE) tablet 100 mg  100 mg Oral Daily Jolaine Artist, MD   100 mg at 10/02/11 2323  . DISCONTD: furosemide (LASIX) injection 40 mg  40 mg Intravenous Once Chauncy Passy, MD      . DISCONTD: furosemide (LASIX) injection 40 mg  40 mg Intravenous BID Jory Sims, NP   40 mg at 10/02/11 2150  . DISCONTD: heparin ADULT infusion 100 units/mL (25000 units/250 mL)  950 Units/hr Intravenous Continuous Arty Baumgartner, RPH 9.5 mL/hr at 10/02/11 1800 9.5 mL/hr at 10/02/11 1800  . DISCONTD: omeprazole (PRILOSEC OTC) EC tablet 20 mg  20 mg Oral QAM Jolaine Artist, MD      . DISCONTD: potassium chloride SA (K-DUR,KLOR-CON) CR tablet 40 mEq  40 mEq Oral BID Jory Sims, NP   40 mEq at 10/02/11 1544  . DISCONTD: psyllium (REGULOID) capsule 0.52 g  0.52 g Oral BID Jolaine Artist, MD         Past Medical History  Diagnosis Date  . Atrial fibrillation     controlled with amiodarone, not a  coumadin candidate due to GI bleeding  . CAD (coronary artery disease)  prior MI  . HTN (hypertension)   . Colon cancer     s/p chemo, RT, sugery  . Colorectal anastomotic stricture   . Cardiomyopathy     s/p ICD 09/30/08  . Iron deficiency anemia   . Diverticulosis   . ED (erectile dysfunction)   . Chronic systolic dysfunction of left ventricle   . Myocardial infarction of inferolateral wall   . AICD (automatic cardioverter/defibrillator) present     ROS:   All systems reviewed and negative except as noted in the HPI.   Past Surgical History  Procedure Date  . Vasectomy   . Colon resection     x2  . Coronary artery bypass graft   . Cholecystectomy   . Back surgery   . Hernia repair   . Cardiac defibrillator placement 09/30/08    by Greggory Brandy  . Cardiac catheterization 11/07/2000    EF 32%  . US echocardiography 08/11/2008    EF 30-35%  . Cardiovascular stress test 08/06/2008    EF 30%     Family History  Problem Relation Age of Onset  . Colon cancer Father   . Diabetes Sister   . Colon cancer Brother      History   Social History  . Marital Status: Married    Spouse Name: N/A    Number of Children: 2  . Years of Education: N/A   Occupational History  . retired    Social History Main Topics  . Smoking status: Current Everyday Smoker    Types: Cigars  . Smokeless tobacco: Not on file  . Alcohol Use: No  . Drug Use: No  . Sexually Active: Not on file   Other Topics Concern  . Not on file   Social History Narrative   Lives in [place] with spouse.  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     BP 123/71  Pulse 92  Temp(Src) 97.3 F (36.3 C) (Oral)  Resp 20  Ht 5\' 11"  (1.803 m)  Wt 165 lb 5.5 oz (75 kg)  BMI 23.06 kg/m2  SpO2 97%  Physical Exam:  Well appearing elderly man, NAD HEENT: Unremarkable, normocephalic and atraumatic Neck:  No JVD, no thyromegally Lungs:  Clear with rales in the right mid lung fields. Minimal expiratory  wheezes. No increased work of breathing. Well-healed ICD incision. HEART:  Regular rate rhythm, no murmurs, no rubs, no clicks Abd:  soft, positive bowel sounds, no organomegally, no rebound, no guarding Ext:  2 plus pulses, no edema, no cyanosis, no clubbing Skin:  No rashes no nodules Neuro:  CN II through XII intact, motor grossly intact  EKG Normal sinus rhythm with incomplete left bundle branch block. Prolonged QT DEVICE   Device interrogation demonstrates an initial episode of atrial fibrillation followed by regularization of the tachycardia into the therapy zone, initial anti-tachycardic pacing resulting in an irregular tachycardia, and followed by ICD shock.  Assess/Plan:  Active Problems:  Atrial fibrillation - he is now back in sinus rhythm. He will continue 200 mg daily amiodarone. He is not a candidate for Coumadin therapy secondary to his history of GI bleeding.  Automatic implantable cardioverter/defibrillator (AICD) activation - his device is working normally and has been reprogrammed today to minimize any additional ICD shocks. A second VT zone has been added. CAP (community acquired pneumonia) - he will continue Avelox and prednisone.

## 2011-10-03 NOTE — Progress Notes (Signed)
*  PRELIMINARY RESULTS* Echocardiogram 2D Echocardiogram has been performed.  [name] [name] [facility] 10/03/2011, 3:06 PM

## 2011-10-03 NOTE — Progress Notes (Signed)
  Courtesy visit. Events noted.  Lab data and xray data reviewed. [name] [name] is in good spirits.  He is being treated for possible pneumonia as well as  Acute on chronic chf and he had an icd firing episode. Please call my partners if closer following desired as the week progresses. Thank you, Crist Infante

## 2011-10-03 NOTE — Progress Notes (Addendum)
TELEMETRY: Reviewed telemetry pt in NSR with occ brief run of atrial fibrillation: Filed Vitals:   10/03/11 0300 10/03/11 0400 10/03/11 0500 10/03/11 0504  BP: 121/55 124/55 110/57 110/57  Pulse: 84 90 87 87  Temp:  97.8 F (36.6 C)    TempSrc:  Oral    Resp: 26 34 29 24  Height:      Weight:      SpO2: 98% 96% 94% 95%    Intake/Output Summary (Last 24 hours) at 10/03/11 0715 Last data filed at 10/03/11 0600  Gross per 24 hour  Intake    360 ml  Output   3100 ml  Net  -2740 ml    SUBJECTIVE Patient reports breathing and cough are better. No chest pain. No fever or chills.  LABS: Basic Metabolic Panel:  Basename 10/02/11 1850 10/02/11 1507 10/02/11 1202  NA -- 136 133*  K -- 3.4* 3.3*  CL -- 98 98  CO2 -- 25 22  GLUCOSE -- 109* 102*  BUN -- 15 16  CREATININE 1.06 1.07 --  CALCIUM -- 9.0 9.2  MG -- -- --  PHOS -- -- --   Liver Function Tests:  Basename 10/02/11 1507  AST 17  ALT 17  ALKPHOS 50  BILITOT 1.2  PROT 6.7  ALBUMIN 3.5   No results found for this basename: LIPASE:2,AMYLASE:2 in the last 72 hours CBC:  Basename 10/02/11 1850 10/02/11 1202  WBC 14.1* 13.6*  NEUTROABS -- 12.5*  HGB 12.9* 12.9*  HCT 38.8* 38.2*  MCV 93.7 93.6  PLT 157 149*   Cardiac Enzymes:  Basename 10/03/11 0104 10/02/11 1506  CKTOTAL 99 63  CKMB 2.4 2.5  CKMBINDEX -- --  TROPONINI <0.30 <0.30   BNP:  Basename 10/02/11 1203  POCBNP 3728.0*   D-Dimer: No results found for this basename: DDIMER:2 in the last 72 hours Hemoglobin A1C: No results found for this basename: HGBA1C in the last 72 hours Fasting Lipid Panel: No results found for this basename: CHOL,HDL,LDLCALC,TRIG,CHOLHDL,LDLDIRECT in the last 72 hours Thyroid Function Tests:  Basename 10/02/11 1507  TSH 0.108*  T4TOTAL --  T3FREE --  THYROIDAB --   Anemia Panel: No results found for this basename: VITAMINB12,FOLATE,FERRITIN,TIBC,IRON,RETICCTPCT in the last 72 hours  Radiology/Studies:  Dg Chest  2 View  10/02/2011  *RADIOLOGY REPORT*  Clinical Data: Shortness of breath.  Defibrillator firing.  CHEST - 2 VIEW  Comparison: 11/30/2009 and 10/01/2008 radiographs.  Findings: The left subclavian AICD leads appear unchanged within the right atrium and right ventricle.  There is stable mild cardiomegaly status post CABG.  There are new right greater than left perihilar airspace opacities and small bilateral pleural effusions.  The osseous structures appear unchanged.  IMPRESSION:  1.  New right greater left air space opacities are associated with small bilateral pleural effusions and may reflect asymmetric edema or early pneumonia.  Radiographic followup recommended. 2.  Stable cardiomegaly and AICD lead placement.  Original Report Authenticated By: Vivia Ewing, M.D.    PHYSICAL EXAM General: Well developed, well nourished, in no acute distress. Head: Normocephalic, atraumatic, sclera non-icteric, no xanthomas, nares are without discharge. Neck: Negative for carotid bruits. JVD not elevated. Lungs: Clear bilaterally to auscultation without wheezes, rales, or rhonchi. Breathing is unlabored. Heart: RRR S1 S2 without murmurs, rubs, or gallops.  Abdomen: Soft, non-tender, non-distended with normoactive bowel sounds. No hepatomegaly. No rebound/guarding. No obvious abdominal masses. Msk:  Strength and tone appears normal for age. Extremities: No clubbing, cyanosis or edema.  Distal pedal pulses are 2+ and equal bilaterally. Neuro: Alert and oriented X 3. Moves all extremities spontaneously. Psych:  Responds to questions appropriately with a normal affect.  ASSESSMENT AND PLAN:  Principal Problem:  *Acute on chronic systolic heart failure Active Problems:  Atrial fibrillation  CAD (coronary artery disease)  Automatic implantable cardioverter/defibrillator (AICD) activation  Respiratory failure, acute and chronic  COPD exacerbation  Pneumonia   Plan: Cxr shows increasing consolidation in  the right upper and lower lobes. Patient remains hypoxic. Will continue antibiotics with Avelox and zithromax. Continue O2. Continue nebs. Will resume metoprolol 50 mg bid for rate control of atrial fibrillation. Increase amiodarone to 200 mg daily. Increase Lasix to 80 mg bid.  Check Echo today.  Patient is not a candidate for long term anticoagultion due to history of GI bleeding. Will have EP assess ICD today and make adjustments as needed.   Signed, Peter [name] MD Exam shows bilateral coarse rales and rhonchi.

## 2011-10-04 ENCOUNTER — Inpatient Hospital Stay (HOSPITAL_COMMUNITY): Payer: Medicare PPO

## 2011-10-04 DIAGNOSIS — I5022 Chronic systolic (congestive) heart failure: Secondary | ICD-10-CM

## 2011-10-04 LAB — CBC
MCH: 31.4 pg (ref 26.0–34.0)
MCHC: 33.9 g/dL (ref 30.0–36.0)
MCV: 92.8 fL (ref 78.0–100.0)
Platelets: 169 10*3/uL (ref 150–400)
RDW: 14.5 % (ref 11.5–15.5)

## 2011-10-04 LAB — BASIC METABOLIC PANEL
CO2: 30 mEq/L (ref 19–32)
Calcium: 9.3 mg/dL (ref 8.4–10.5)
Creatinine, Ser: 1.19 mg/dL (ref 0.50–1.35)
GFR calc non Af Amer: 57 mL/min — ABNORMAL LOW (ref >90)
Glucose, Bld: 130 mg/dL — ABNORMAL HIGH (ref 70–99)

## 2011-10-04 LAB — DIFFERENTIAL
Basophils Absolute: 0 10*3/uL (ref 0.0–0.1)
Basophils Relative: 0 % (ref 0–1)
Eosinophils Absolute: 0 10*3/uL (ref 0.0–0.7)
Eosinophils Relative: 0 % (ref 0–5)
Neutrophils Relative %: 90 % — ABNORMAL HIGH (ref 43–77)

## 2011-10-04 MED ORDER — IPRATROPIUM BROMIDE 0.02 % IN SOLN
0.5000 mg | Freq: Three times a day (TID) | RESPIRATORY_TRACT | Status: DC
  Administered 2011-10-04 – 2011-10-05 (×5): 0.5 mg via RESPIRATORY_TRACT
  Filled 2011-10-04 (×7): qty 2.5

## 2011-10-04 MED ORDER — FUROSEMIDE 40 MG PO TABS
40.0000 mg | ORAL_TABLET | Freq: Two times a day (BID) | ORAL | Status: DC
  Administered 2011-10-04 – 2011-10-06 (×4): 40 mg via ORAL
  Filled 2011-10-04 (×6): qty 1

## 2011-10-04 NOTE — Progress Notes (Signed)
TELEMETRY: Reviewed telemetry pt in NSR with frequent PACs. Occ PVCs: Filed Vitals:   10/04/11 0126 10/04/11 0200 10/04/11 0500 10/04/11 0503  BP: 102/61   111/56  Pulse: 74 73 67 50  Temp:   97.6 F (36.4 C)   TempSrc:      Resp:      Height:      Weight:      SpO2: 96% 94% 92% 92%    Intake/Output Summary (Last 24 hours) at 10/04/11 0728 Last data filed at 10/04/11 0100  Gross per 24 hour  Intake    793 ml  Output   1270 ml  Net   -477 ml    SUBJECTIVE Feels much better. Breathing much improved. States cough resolved. No chills. Appetite poor.   LABS: Basic Metabolic Panel:  Basename 10/04/11 0415 10/03/11 1022  NA 138 137  K 3.9 3.2*  CL 98 96  CO2 30 27  GLUCOSE 130* 179*  BUN 20 14  CREATININE 1.19 1.12  CALCIUM 9.3 9.4  MG -- --  PHOS -- --   Liver Function Tests:  [facility] 10/02/11 1507  AST 17  ALT 17  ALKPHOS 50  BILITOT 1.2  PROT 6.7  ALBUMIN 3.5   No results found for this basename: LIPASE:2,AMYLASE:2 in the last 72 hours CBC:  Basename 10/04/11 0415 10/02/11 1850 10/02/11 1202  WBC 14.8* 14.1* --  NEUTROABS 13.2* -- 12.5*  HGB 12.2* 12.9* --  HCT 36.0* 38.8* --  MCV 92.8 93.7 --  PLT 169 157 --   Cardiac Enzymes:  Basename 10/03/11 1948 10/03/11 1022 10/03/11 0104  CKTOTAL 103 108 99  CKMB 2.8 2.3 2.4  CKMBINDEX -- -- --  TROPONINI <0.30 <0.30 <0.30   BNP:  Basename 10/02/11 1203  POCBNP 3728.0*   D-Dimer: No results found for this basename: DDIMER:2 in the last 72 hours Hemoglobin A1C: No results found for this basename: HGBA1C in the last 72 hours Fasting Lipid Panel: No results found for this basename: CHOL,HDL,LDLCALC,TRIG,CHOLHDL,LDLDIRECT in the last 72 hours Thyroid Function Tests:  Basename 10/02/11 1507  TSH 0.108*  T4TOTAL --  T3FREE --  THYROIDAB --   Anemia Panel: No results found for this basename: VITAMINB12,FOLATE,FERRITIN,TIBC,IRON,RETICCTPCT in the last 72 hours  Radiology/Studies:  Dg Chest 2  View  10/03/2011  *RADIOLOGY REPORT*  Clinical Data: Shortness of breath and defibrillator malfunction. Cough and congestion.  CHEST - 2 VIEW  Comparison: 10/02/2011  Findings: There is a left chest wall AICD with lead in the right atrial appendage and right ventricle.  The heart size is normal.  The patient is status post median sternotomy and CABG procedure.  Bilateral pleural effusions are slightly increased in volume from previous exam.  Airspace opacities throughout the right lung and in the left midlung are increased in the interval.  IMPRESSION:  1.  Increase in pleural effusions. 2.  Increase and bilateral airspace opacities.  Original Report Authenticated By: Angelita Ingles, M.D.   Dg Chest 2 View  10/02/2011  *RADIOLOGY REPORT*  Clinical Data: Shortness of breath.  Defibrillator firing.  CHEST - 2 VIEW  Comparison: 11/30/2009 and 10/01/2008 radiographs.  Findings: The left subclavian AICD leads appear unchanged within the right atrium and right ventricle.  There is stable mild cardiomegaly status post CABG.  There are new right greater than left perihilar airspace opacities and small bilateral pleural effusions.  The osseous structures appear unchanged.  IMPRESSION:  1.  New right greater left air space opacities are associated  with small bilateral pleural effusions and may reflect asymmetric edema or early pneumonia.  Radiographic followup recommended. 2.  Stable cardiomegaly and AICD lead placement.  Original Report Authenticated By: Vivia Ewing, M.D.    PHYSICAL EXAM General: Well developed, well nourished, in no acute distress. Head: Normocephalic, atraumatic, sclera non-icteric, no xanthomas, nares are without discharge. Neck: Negative for carotid bruits. JVD not elevated. Lungs: Coarse rhonchi bilaterally. Improved air movement.Breathing is unlabored. Heart: RRR S1 S2 without murmurs, rubs, or gallops.  Abdomen: Soft, non-tender, non-distended with normoactive bowel sounds. No  hepatomegaly. No rebound/guarding. No obvious abdominal masses. Msk:  Strength and tone appears normal for age. Extremities: No clubbing, cyanosis or edema.  Distal pedal pulses are 2+ and equal bilaterally. Neuro: Alert and oriented X 3. Moves all extremities spontaneously. Psych:  Responds to questions appropriately with a normal affect.  ASSESSMENT AND PLAN:  Principal Problem:  *Acute on chronic systolic heart failure Active Problems:  Atrial fibrillation  CAD (coronary artery disease)  Automatic implantable cardioverter/defibrillator (AICD) activation  Respiratory failure, acute and chronic  COPD exacerbation  CAP (community acquired pneumonia)   Plan: CXR today is improved. Oxygenation better.Clinically better. Will transfer patient to telemetry today. Change Lasix to po. Ambulate. Continue nebulizer Rx. Continue Avelox and Zithromax for antibiotic coverage for CAP. On steroid taper. On increased dose of amiodarone 200mg  daily. Rate OK on lopressor 50 mg bid. Possible discharge in am if coarse continues to improve.  Signed, Peter [name] MD

## 2011-10-04 NOTE — Progress Notes (Signed)
Pt to be transferred to unit 4700, report given to receiving nurse-Katie.  Pt and spouse educated on transfer, both verbalized their understanding.  Pt removed from monitor, stable to transport via wheel chair and off monitor.

## 2011-10-04 NOTE — Progress Notes (Signed)
Cosign for Norman Herrlich RN assessment, medication, IV, and care plan.

## 2011-10-05 DIAGNOSIS — I5023 Acute on chronic systolic (congestive) heart failure: Secondary | ICD-10-CM

## 2011-10-05 LAB — BASIC METABOLIC PANEL
Calcium: 9.5 mg/dL (ref 8.4–10.5)
GFR calc Af Amer: 57 mL/min — ABNORMAL LOW (ref >90)
GFR calc non Af Amer: 49 mL/min — ABNORMAL LOW (ref >90)
Glucose, Bld: 112 mg/dL — ABNORMAL HIGH (ref 70–99)
Sodium: 139 mEq/L (ref 135–145)

## 2011-10-05 MED ORDER — DIGOXIN 0.25 MG/ML IJ SOLN
0.2500 mg | Freq: Once | INTRAMUSCULAR | Status: AC
  Administered 2011-10-05: 0.25 mg via INTRAVENOUS
  Filled 2011-10-05 (×2): qty 1

## 2011-10-05 MED ORDER — PITAVASTATIN CALCIUM 2 MG PO TABS
2.0000 mg | ORAL_TABLET | ORAL | Status: DC
  Filled 2011-10-05: qty 1

## 2011-10-05 MED ORDER — METOPROLOL TARTRATE 50 MG PO TABS
75.0000 mg | ORAL_TABLET | Freq: Two times a day (BID) | ORAL | Status: DC
  Administered 2011-10-05 – 2011-10-06 (×3): 75 mg via ORAL
  Filled 2011-10-05 (×5): qty 1

## 2011-10-05 MED ORDER — DIGOXIN 125 MCG PO TABS
0.1250 mg | ORAL_TABLET | Freq: Every day | ORAL | Status: DC
  Administered 2011-10-06: 0.125 mg via ORAL
  Filled 2011-10-05: qty 1

## 2011-10-05 MED ORDER — AMIODARONE HCL 200 MG PO TABS
300.0000 mg | ORAL_TABLET | Freq: Every day | ORAL | Status: DC
  Administered 2011-10-05 – 2011-10-06 (×2): 300 mg via ORAL
  Filled 2011-10-05 (×2): qty 1

## 2011-10-05 NOTE — Progress Notes (Signed)
Notified by Monitor tech that patient's HR was in the 140's, pt was currently taking meds and speaking with nurse.  Ten minutes later monitor tech reported pt's HR increased to 180's, when entering the room patient was in the restroom, immediately upon leaving monitor reported patient's HR was 259, pt stated that his ICD fired off. MD was notified, no orders given at this time, will continue to monitor patient._____________________________________D. Owens Shark RN

## 2011-10-05 NOTE — Progress Notes (Signed)
TELEMETRY: Reviewed telemetry pt in NSR with occ PVC: Filed Vitals:   10/04/11 2100 10/04/11 2141 10/05/11 0246 10/05/11 0513  BP: 105/61 104/61  104/67  Pulse: 75 74  66  Temp: 99.3 F (37.4 C)   97.3 F (36.3 C)  TempSrc:    Oral  Resp: 20   18  Height:      Weight:   72.2 kg (159 lb 2.8 oz)   SpO2: 94%   95%    Intake/Output Summary (Last 24 hours) at 10/05/11 0724 Last data filed at 10/05/11 0600  Gross per 24 hour  Intake    971 ml  Output   1525 ml  Net   -554 ml    SUBJECTIVE Feels very well. No SOB or cough. No fever or edema. Ambulating.Sats are good. Weight is coming down.   LABS: Basic Metabolic Panel:  Basename 10/05/11 0630 10/04/11 0415  NA 139 138  K 3.8 3.9  CL 100 98  CO2 30 30  GLUCOSE 112* 130*  BUN 29* 20  CREATININE 1.35 1.19  CALCIUM 9.5 9.3  MG -- --  PHOS -- --   Liver Function Tests:  [facility] 10/02/11 1507  AST 17  ALT 17  ALKPHOS 50  BILITOT 1.2  PROT 6.7  ALBUMIN 3.5   No results found for this basename: LIPASE:2,AMYLASE:2 in the last 72 hours CBC:  Basename 10/04/11 0415 10/02/11 1850 10/02/11 1202  WBC 14.8* 14.1* --  NEUTROABS 13.2* -- 12.5*  HGB 12.2* 12.9* --  HCT 36.0* 38.8* --  MCV 92.8 93.7 --  PLT 169 157 --   Cardiac Enzymes:  Basename 10/03/11 1948 10/03/11 1022 10/03/11 0104  CKTOTAL 103 108 99  CKMB 2.8 2.3 2.4  CKMBINDEX -- -- --  TROPONINI <0.30 <0.30 <0.30   BNP:  Basename 10/02/11 1203  POCBNP 3728.0*   D-Dimer: No results found for this basename: DDIMER:2 in the last 72 hours Hemoglobin A1C: No results found for this basename: HGBA1C in the last 72 hours Fasting Lipid Panel: No results found for this basename: CHOL,HDL,LDLCALC,TRIG,CHOLHDL,LDLDIRECT in the last 72 hours Thyroid Function Tests:  Basename 10/02/11 1507  TSH 0.108*  T4TOTAL --  T3FREE --  THYROIDAB --   Anemia Panel: No results found for this basename: VITAMINB12,FOLATE,FERRITIN,TIBC,IRON,RETICCTPCT in the last 72  hours  Radiology/Studies:  Dg Chest 2 View  10/04/2011  *RADIOLOGY REPORT*  Clinical Data: Follow up pneumonia.  CHEST - 2 VIEW  Comparison: 10/03/2011  Findings: Two views of the chest were obtained.  Stable position of the left cardiac ICD.  Airspace disease in the central right lung appears to be decreasing.  There are persistent densities in the right lung. There are small effusions.  Heart size is within normal limits.  Status post CABG procedure.  IMPRESSION: Mild improvement in the right lung airspace disease.  Small bilateral pleural effusions.  Original Report Authenticated By: Markus Daft, M.D.   Dg Chest 2 View  10/03/2011  *RADIOLOGY REPORT*  Clinical Data: Shortness of breath and defibrillator malfunction. Cough and congestion.  CHEST - 2 VIEW  Comparison: 10/02/2011  Findings: There is a left chest wall AICD with lead in the right atrial appendage and right ventricle.  The heart size is normal.  The patient is status post median sternotomy and CABG procedure.  Bilateral pleural effusions are slightly increased in volume from previous exam.  Airspace opacities throughout the right lung and in the left midlung are increased in the interval.  IMPRESSION:  1.  Increase in pleural effusions. 2.  Increase and bilateral airspace opacities.  Original Report Authenticated By: Angelita Ingles, M.D.   Dg Chest 2 View  10/02/2011  *RADIOLOGY REPORT*  Clinical Data: Shortness of breath.  Defibrillator firing.  CHEST - 2 VIEW  Comparison: 11/30/2009 and 10/01/2008 radiographs.  Findings: The left subclavian AICD leads appear unchanged within the right atrium and right ventricle.  There is stable mild cardiomegaly status post CABG.  There are new right greater than left perihilar airspace opacities and small bilateral pleural effusions.  The osseous structures appear unchanged.  IMPRESSION:  1.  New right greater left air space opacities are associated with small bilateral pleural effusions and may reflect  asymmetric edema or early pneumonia.  Radiographic followup recommended. 2.  Stable cardiomegaly and AICD lead placement.  Original Report Authenticated By: Vivia Ewing, M.D.    PHYSICAL EXAM General: Well developed, well nourished, in no acute distress. Head: Normocephalic, atraumatic, sclera non-icteric, no xanthomas, nares are without discharge. Neck: Negative for carotid bruits. JVD not elevated. Lungs: Bilateral diffuse crackles. No wheezing.Improved from yesterday. Breathing is unlabored. Heart: RRR S1 S2 without murmurs, rubs, or gallops.  Abdomen: Soft, non-tender, non-distended with normoactive bowel sounds. No hepatomegaly. No rebound/guarding. No obvious abdominal masses. Msk:  Strength and tone appears normal for age. Extremities: No clubbing, cyanosis or edema.  Distal pedal pulses are 2+ and equal bilaterally. Neuro: Alert and oriented X 3. Moves all extremities spontaneously. Psych:  Responds to questions appropriately with a normal affect.  ASSESSMENT AND PLAN:  Principal Problem:  *Acute on chronic systolic heart failure Active Problems:  Atrial fibrillation  CAD (coronary artery disease)  Automatic implantable cardioverter/defibrillator (AICD) activation  Respiratory failure, acute and chronic  COPD exacerbation  CAP (community acquired pneumonia)   Plan: Patient continues to improve clinically. Stable for discharge today. Will complete coarse of antibiotics with Zithromax and Avelox. On Lasix BID now. Continue metoprolol 50 mg BID. On increased dose of amiodarone 200 mg daily. Follow up with Cecille Rubin in 1 week.  Also follow up with Dr. Joylene Draft in 3 weeks. Signed, Anntonette Madewell [name] MD

## 2011-10-05 NOTE — Progress Notes (Addendum)
Asked to evaluate patient's device for reported ICD shock.  Device interrogation shows 2 shocks in the setting of afib with RVR.  Per previous programming changes per Dr. Lovena Le, zones were changed to a FVT zone from 188-214bpm and a VF zone at 188 bpm.  These changes had been made on Monday.    Pt has primarily been in [place] since interrogation on Monday 10-03-11.  First episode in VF zone shows afib with RVR, 1 attempt at burst pacing failed and then pt received 1 25J shock that restored SR.  He then went back into afib with RVR, 1 attempt at burst pacing failed and 1 attempt at ramp pacing failed, then SR restored with 25J shock.    Pt states at the time of shocks, he was going to the restroom to urinate.  No pre-syncope or symptoms of tachypalpitations.    Discussed with Dr. [name] and Dr. Lovena Le, plans to increase Amiodarone to 300mg  po daily, increase Metoprolol to 75mg  bid, and add Digoxin; 1 dose of 0.25mg  daily, then to start Digoxin 0.125mg  daily 10-06-11.    Dr. [name] at beside and discussed plan with patient and the fact that he would not be discharged today. We will have EP round on patient in the morning.  Possible future plans if medications fail to control rhythm could include AV node ablation and CRT upgrade per Dr. Lovena Le.  Chanetta Marshall, Therapist, sports, BSN

## 2011-10-05 NOTE — Progress Notes (Signed)
CM spoke to pt and he refuses home health RN at this time. He lives with his wife. CM did talk to pt about his diet. No additional needs assessed by CM. Bethena Roys

## 2011-10-06 ENCOUNTER — Encounter: Payer: Medicare PPO | Admitting: *Deleted

## 2011-10-06 MED ORDER — AMIODARONE HCL 200 MG PO TABS: 300.0000 mg | ORAL_TABLET | Freq: Every day | ORAL | Status: DC

## 2011-10-06 MED ORDER — IPRATROPIUM BROMIDE 0.02 % IN SOLN: 0.5000 mg | RESPIRATORY_TRACT | Status: DC | PRN

## 2011-10-06 MED ORDER — POTASSIUM CHLORIDE CRYS ER 20 MEQ PO TBCR: 20.0000 meq | EXTENDED_RELEASE_TABLET | Freq: Every day | ORAL | Status: DC

## 2011-10-06 MED ORDER — DIGOXIN 125 MCG PO TABS: 0.1250 mg | ORAL_TABLET | Freq: Every day | ORAL | Status: DC

## 2011-10-06 MED ORDER — MOXIFLOXACIN HCL 400 MG PO TABS: 400.0000 mg | ORAL_TABLET | Freq: Every day | ORAL | Status: AC

## 2011-10-06 MED ORDER — PREDNISONE 10 MG PO TABS: ORAL_TABLET | ORAL | Status: DC

## 2011-10-06 MED ORDER — ASPIRIN 325 MG PO TABS: 325.0000 mg | ORAL_TABLET | Freq: Every day | ORAL | Status: DC

## 2011-10-06 NOTE — Discharge Summary (Signed)
Patient ID: [name] [name],  MRN: [id number], DOB/AGE: 03-Oct-1932 77 y.o.  Admit date: 10/02/2011 Discharge date: 10/06/2011  Discharge Diagnoses Principal Problem:  *Acute on chronic systolic heart failure Active Problems:  Atrial fibrillation  CAD (coronary artery disease)  Automatic implantable cardioverter/defibrillator (AICD) activation  Respiratory failure, acute and chronic  COPD exacerbation  CAP (community acquired pneumonia)   Allergies Allergies  Allergen Reactions  . Talwin     Procedures CXR'S  11/4 IMPRESSION:  1. New right greater left air space opacities are associated with small bilateral pleural effusions and may reflect asymmetric edema or early pneumonia. Radiographic followup recommended. 2. Stable cardiomegaly and AICD lead placement.  11/5 IMPRESSION:    1. Increase in pleural effusions. 2. Increase and bilateral airspace opacities.  11/6 IMPRESSION: Mild improvement in the right lung airspace disease.  Small bilateral pleural effusions  ICD INTERROGATION  History of Present Illness Mr. Petru is a 77 y/o male patient of Dr. [name] with known history as stated above. He is not a coumadin candidate. He has been experiencing cold-like symptoms for 4 days to include cough,congestion and fever. He has been taking Mucinex D for symptomatic relief. He has not been taking lasix 40 mg daily as prescribed, because he only takes it when he feels it to be necessary, although prescribed as a daily dose. He had worsening symptoms for the last 24 hours, with increased work of breathing and wheezes. Non productive cough. No weight gain or edema. No CP. He got up this morning later than his usual 6 am, around 10:30 am and went to the bathroom. He felt his defibrillator shock him. His wife heard him react to this, but he refused to come to ER. She gave him one NTG, and called his daughter who convinced him to come in.  [employer] has interrogated device and confirmed  that he had one discharge around 10:45 am. There has been evidence of atiral fib for last few weeks which was converted with his ICD shock. No Optivol tracing available. CXR demonstrated asymmetric edema or early pneumonia. We are asked to see patient for continued management of Afib and possible CHF. BNP on admission 3728.0 Since admission to ER he has received Lasix 80 mg IV and been placed on BIPAP. Respiratory status improved.   Hospital Course   Pt was admitted and placed on IV lasix with good diuresis and an 8 lb weight loss for this hospitalization.  As cxr also sugg airspace dzs, pt was placed on abx therapy and has been afebrile.  He initially did well w/o further a.fib on his home dose of amiodarone but on the afternoon of 11/7, pt had recurrent afib, which then became wide in complex and was subsequently shocked by his device with resumption of sinus rhythm.  2/2 to recurrent a.fib, pts amiodarone was increased to 300mg  bid, while his BB was up titrated and digoxin was added.  He has had no further a.fib and we will d/c him home today in good condition.  Discharge Vitals:  Blood pressure 118/71, pulse 64, temperature 98.2 F (36.8 C), temperature source Oral, resp. rate 18, height 5\' 11"  (1.803 m), weight 157 lb 12.8 oz (71.578 kg), SpO2 96.00%.   Labs:   Lab Results  Component Value Date   WBC 14.8* 10/04/2011   HGB 12.2* 10/04/2011   HCT 36.0* 10/04/2011   MCV 92.8 10/04/2011   PLT 169 10/04/2011     Lab 10/05/11 0630 10/02/11 1507  NA 139 --  K 3.8 --  CL 100 --  CO2 30 --  BUN 29* --  CREATININE 1.35 --  CALCIUM 9.5 --  PROT -- 6.7  BILITOT -- 1.2  ALKPHOS -- 50  ALT -- 17  AST -- 17  GLUCOSE 112* --   Lab Results  Component Value Date   CKTOTAL 103 10/03/2011   CKMB 2.8 10/03/2011   TROPONINI <0.30 10/03/2011   Discharge Orders    Future Appointments: Provider: Department: Dept Phone: Center:   10/11/2011 10:30 AM Burtis Junes, NP [facility] [phone]  None     Follow-up Information    Follow up with Truitt Merle, NP on 10/11/2011. (10:30)    Contact information:   [facility] [place], [place] [phone]          Discharge Medications:  Current Discharge Medication List    START taking these medications   Details  aspirin 325 MG tablet Take 1 tablet (325 mg total) by mouth daily.    digoxin (LANOXIN) 0.125 MG tablet Take 1 tablet (0.125 mg total) by mouth daily. Qty: 30 tablet, Refills: 6    moxifloxacin (AVELOX) 400 MG tablet Take 1 tablet (400 mg total) by mouth daily. Qty: 3 tablet, Refills: 0    predniSONE (DELTASONE) 10 MG tablet 2 tabs daily 11/9 and 11/10 then 1 tab daily 11/11 and 11/12 . Qty: 6 tablet, Refills: 0      CONTINUE these medications which have CHANGED   Details  amiodarone (PACERONE) 200 MG tablet Take 1.5 tablets (300 mg total) by mouth daily. Qty: 45 tablet, Refills: 6    potassium chloride SA (K-DUR,KLOR-CON) 20 MEQ tablet Take 1 tablet (20 mEq total) by mouth daily. Qty: 30 tablet, Refills: 6      CONTINUE these medications which have NOT CHANGED   Details  amLODipine (NORVASC) 5 MG tablet Take 5 mg by mouth daily.      cholecalciferol (VITAMIN D) 1000 UNITS tablet Take 1,000 Units by mouth daily.      diphenoxylate-atropine (LOMOTIL) 2.5-0.025 MG per tablet Take 2 tablets by mouth as needed. For diarrhea     furosemide (LASIX) 40 MG tablet Take 40 mg by mouth daily.      Metoprolol 50mg  tablet 1.5 tabs daily         Multiple Vitamins-Minerals (MULTIVITAMINS THER. W/MINERALS) TABS Take 1 tablet by mouth daily.      omeprazole (PRILOSEC OTC) 20 MG tablet Take 20 mg by mouth daily as needed. For acid reflux     Pitavastatin Calcium (LIVALO) 4 MG TABS Take 2 mg by mouth See admin instructions. 1/2 Tablet 3 Times a Week    psyllium (REGULOID) 0.52 G capsule Take 0.52 g by mouth 2 (two) times daily.      ramipril (ALTACE) 5  MG capsule Take 5 mg by mouth daily.      pravastatin (PRAVACHOL) 20 MG tablet Take 20 mg by mouth daily.        Outstanding Labs/Studies:  NONE  Duration of Discharge Encounter: Greater than 30 minutes including physician time.  Signed, Murray Hodgkins NP 10/06/2011, 2:04 PM   Co Sign: [name], MD 10/06/2011 8:23 PM

## 2011-10-06 NOTE — Progress Notes (Signed)
Gave pt discharge instructions and prescriptions. Dc to home via wheelchair. Accompanied by wife and tech.

## 2011-10-06 NOTE — Progress Notes (Signed)
SUBJECTIVE: The patient is doing well today.  At this time, he denies chest pain, shortness of breath, or any new concerns.  He received ICD shock yesterday for afib with RVR.  OBJECTIVE: Physical Exam: Filed Vitals:   10/05/11 2115 10/05/11 2239 10/05/11 2248 10/06/11 0548  BP:  102/58 102/58 112/70  Pulse:  72 72 56  Temp:  97.3 F (36.3 C)  98.2 F (36.8 C)  TempSrc:  Oral  Oral  Resp:  20  18  Height:      Weight:    157 lb 12.8 oz (71.578 kg)  SpO2: 92% 93%  96%    Intake/Output Summary (Last 24 hours) at 10/06/11 0742 Last data filed at 10/06/11 0551  Gross per 24 hour  Intake    686 ml  Output   1401 ml  Net   -715 ml    Telemetry reveals sinus rhythm  GEN- The patient is well appearing, alert and oriented x 3 today.   Head- normocephalic, atraumatic Eyes-  Sclera clear, conjunctiva pink Ears- hearing intact Oropharynx- clear Neck- supple, no JVP Lymph- no cervical lymphadenopathy Lungs- Clear to ausculation bilaterally, normal work of breathing Heart- Regular rate and rhythm, no murmurs, rubs or gallops, PMI not laterally displaced GI- soft, NT, ND, + BS Extremities- no clubbing, cyanosis, or edema Skin- no rash or lesion  LABS: Basic Metabolic Panel:  Basename 10/05/11 0630 10/04/11 0415  NA 139 138  K 3.8 3.9  CL 100 98  CO2 30 30  GLUCOSE 112* 130*  BUN 29* 20  CREATININE 1.35 1.19  CALCIUM 9.5 9.3  MG -- --  PHOS -- --   Liver Function Tests: No results found for this basename: AST:2,ALT:2,ALKPHOS:2,BILITOT:2,PROT:2,ALBUMIN:2 in the last 72 hours No results found for this basename: LIPASE:2,AMYLASE:2 in the last 72 hours CBC:  Basename 10/04/11 0415  WBC 14.8*  NEUTROABS 13.2*  HGB 12.2*  HCT 36.0*  MCV 92.8  PLT 169   Cardiac Enzymes:  Basename 10/03/11 1948 10/03/11 1022  CKTOTAL 103 108  CKMB 2.8 2.3  CKMBINDEX -- --  TROPONINI <0.30 <0.30    RADIOLOGY: Dg Chest 2 View  10/04/2011  *RADIOLOGY REPORT*  Clinical Data:  Follow up pneumonia.  CHEST - 2 VIEW  Comparison: 10/03/2011  Findings: Two views of the chest were obtained.  Stable position of the left cardiac ICD.  Airspace disease in the central right lung appears to be decreasing.  There are persistent densities in the right lung. There are small effusions.  Heart size is within normal limits.  Status post CABG procedure.  IMPRESSION: Mild improvement in the right lung airspace disease.  Small bilateral pleural effusions.  Original Report Authenticated By: Markus Daft, M.D.   Dg Chest 2 View  10/03/2011  *RADIOLOGY REPORT*  Clinical Data: Shortness of breath and defibrillator malfunction. Cough and congestion.  CHEST - 2 VIEW  Comparison: 10/02/2011  Findings: There is a left chest wall AICD with lead in the right atrial appendage and right ventricle.  The heart size is normal.  The patient is status post median sternotomy and CABG procedure.  Bilateral pleural effusions are slightly increased in volume from previous exam.  Airspace opacities throughout the right lung and in the left midlung are increased in the interval.  IMPRESSION:  1.  Increase in pleural effusions. 2.  Increase and bilateral airspace opacities.  Original Report Authenticated By: Angelita Ingles, M.D.   Dg Chest 2 View  10/02/2011  *RADIOLOGY REPORT*  Clinical  Data: Shortness of breath.  Defibrillator firing.  CHEST - 2 VIEW  Comparison: 11/30/2009 and 10/01/2008 radiographs.  Findings: The left subclavian AICD leads appear unchanged within the right atrium and right ventricle.  There is stable mild cardiomegaly status post CABG.  There are new right greater than left perihilar airspace opacities and small bilateral pleural effusions.  The osseous structures appear unchanged.  IMPRESSION:  1.  New right greater left air space opacities are associated with small bilateral pleural effusions and may reflect asymmetric edema or early pneumonia.  Radiographic followup recommended. 2.  Stable cardiomegaly  and AICD lead placement.  Original Report Authenticated By: Vivia Ewing, M.D.    ASSESSMENT AND PLAN:  1.  Afib with RVR-now maintaining sinus rhythm     Continue current amiodarone, metoprolol, and digoxin on discharge     Will change device to single VF zone at 222 bpm with 30/40 NID to minimize inappropriate shocks  OK to discharge home today.  Follow-up with Truitt Merle in 1 week.  [name], MD 10/06/2011 7:42 AM

## 2011-10-11 ENCOUNTER — Encounter (HOSPITAL_COMMUNITY): Payer: Self-pay

## 2011-10-11 ENCOUNTER — Encounter: Payer: Medicare PPO | Admitting: Nurse Practitioner

## 2011-10-12 ENCOUNTER — Encounter: Payer: Self-pay | Admitting: *Deleted

## 2011-10-12 ENCOUNTER — Encounter: Payer: Medicare PPO | Admitting: Nurse Practitioner

## 2011-10-12 ENCOUNTER — Encounter: Payer: Self-pay | Admitting: Cardiology

## 2011-10-14 ENCOUNTER — Encounter: Payer: Self-pay | Admitting: Nurse Practitioner

## 2011-10-14 ENCOUNTER — Ambulatory Visit (INDEPENDENT_AMBULATORY_CARE_PROVIDER_SITE_OTHER): Payer: Medicare PPO | Admitting: Nurse Practitioner

## 2011-10-14 VITALS — BP 112/68 | HR 58 | Ht 71.0 in | Wt 154.1 lb

## 2011-10-14 DIAGNOSIS — K922 Gastrointestinal hemorrhage, unspecified: Secondary | ICD-10-CM | POA: Insufficient documentation

## 2011-10-14 DIAGNOSIS — I480 Paroxysmal atrial fibrillation: Secondary | ICD-10-CM

## 2011-10-14 DIAGNOSIS — I2589 Other forms of chronic ischemic heart disease: Secondary | ICD-10-CM

## 2011-10-14 DIAGNOSIS — R110 Nausea: Secondary | ICD-10-CM

## 2011-10-14 DIAGNOSIS — I255 Ischemic cardiomyopathy: Secondary | ICD-10-CM | POA: Insufficient documentation

## 2011-10-14 DIAGNOSIS — I4891 Unspecified atrial fibrillation: Secondary | ICD-10-CM

## 2011-10-14 LAB — BASIC METABOLIC PANEL
BUN: 34 mg/dL — ABNORMAL HIGH (ref 6–23)
CO2: 28 mEq/L (ref 19–32)
Calcium: 9.2 mg/dL (ref 8.4–10.5)
Chloride: 105 mEq/L (ref 96–112)
Creatinine, Ser: 1.3 mg/dL (ref 0.4–1.5)
GFR: 58.9 mL/min — ABNORMAL LOW (ref >60.00)
Glucose, Bld: 122 mg/dL — ABNORMAL HIGH (ref 70–99)
Potassium: 4 mEq/L (ref 3.5–5.1)
Sodium: 141 mEq/L (ref 135–145)

## 2011-10-14 LAB — CBC WITH DIFFERENTIAL/PLATELET
Basophils Absolute: 0 10*3/uL (ref 0.0–0.1)
Basophils Relative: 0 % (ref 0.0–3.0)
Eosinophils Absolute: 0 10*3/uL (ref 0.0–0.7)
Eosinophils Relative: 0.3 % (ref 0.0–5.0)
HCT: 26.7 % — ABNORMAL LOW (ref 39.0–52.0)
Hemoglobin: 9 g/dL — ABNORMAL LOW (ref 13.0–17.0)
Lymphocytes Relative: 6.2 % — ABNORMAL LOW (ref 12.0–46.0)
Lymphs Abs: 0.9 10*3/uL (ref 0.7–4.0)
MCHC: 33.9 g/dL (ref 30.0–36.0)
MCV: 94.5 fl (ref 78.0–100.0)
Monocytes Absolute: 0.4 10*3/uL (ref 0.1–1.0)
Monocytes Relative: 2.9 % — ABNORMAL LOW (ref 3.0–12.0)
Neutro Abs: 13 10*3/uL — ABNORMAL HIGH (ref 1.4–7.7)
Neutrophils Relative %: 90.6 % — ABNORMAL HIGH (ref 43.0–77.0)
Platelets: 426 10*3/uL — ABNORMAL HIGH (ref 150.0–400.0)
RBC: 2.82 Mil/uL — ABNORMAL LOW (ref 4.22–5.81)
RDW: 14.6 % (ref 11.5–14.6)
WBC: 14.3 10*3/uL — ABNORMAL HIGH (ref 4.5–10.5)

## 2011-10-14 MED ORDER — DIGOXIN 125 MCG PO TABS: ORAL_TABLET | ORAL | Status: DC

## 2011-10-14 NOTE — Progress Notes (Signed)
[name] Date of Birth: December 31, 1931 Medical Record [id number]  History of Present Illness: [name] [name] is seen back today for a post hospital visit. He is seen for Dr. [name]. He has multiple issues which include an ischemic cardiomyopathy. EF is 35 to 40%. He has an ICD in place. Has had PAF. Not a candidate for coumadin due to bleeding history. He is on amiodarone. He was hospitalized after having an ICD discharge. He had been using some cold medicine with decongestant. Was noted to be in atrial fib on interrogation. He says he had not been feeling all that great prior to the hospitalization. He is not aware of his atrial fib.   He comes in today. He has been home for about 10 days. Feels nauseated. Vomited some on Wednesday. Appetite is poor. No chest pain. Denies being short of breath. No swelling. No further ICD shocks. He was sent home on aspirin. He then started having dark stools. Was seen by Dr. Joylene Draft earlier this week. Aspirin was stopped. Hemoglobin was reported to be 13. His stools are not as dark now. No other bleeding issues.   Current Outpatient Prescriptions on File Prior to Visit  Medication Sig Dispense Refill  . amiodarone (PACERONE) 200 MG tablet Take 1.5 tablets (300 mg total) by mouth daily.  45 tablet  6  . amLODipine (NORVASC) 5 MG tablet Take 5 mg by mouth daily.        . cholecalciferol (VITAMIN D) 1000 UNITS tablet Take 1,000 Units by mouth daily.        . diphenoxylate-atropine (LOMOTIL) 2.5-0.025 MG per tablet Take 2 tablets by mouth as needed. For diarrhea       . furosemide (LASIX) 40 MG tablet Take 40 mg by mouth daily.        . metoprolol (LOPRESSOR) 50 MG tablet Take 50 mg by mouth 2 (two) times daily.        . Multiple Vitamins-Minerals (MULTIVITAMINS THER. W/MINERALS) TABS Take 1 tablet by mouth daily.        Marland Kitchen omeprazole (PRILOSEC OTC) 20 MG tablet Take 20 mg by mouth daily as needed. For acid reflux       . Pitavastatin Calcium (LIVALO) 4 MG TABS Take 2  mg by mouth See admin instructions. 1/2 Tablet 3 Times a Week      . potassium chloride SA (K-DUR,KLOR-CON) 20 MEQ tablet Take 1 tablet (20 mEq total) by mouth daily.  30 tablet  6  . predniSONE (DELTASONE) 10 MG tablet 2 tabs daily 11/9 and 11/10 then 1 tab daily 11/11 and 11/12 .  6 tablet  0  . psyllium (REGULOID) 0.52 G capsule Take 0.52 g by mouth as needed.       . ramipril (ALTACE) 5 MG capsule Take 5 mg by mouth daily.        Marland Kitchen DISCONTD: metoprolol (LOPRESSOR) 50 MG tablet Take 25 mg by mouth 2 (two) times daily.          Allergies  Allergen Reactions  . Aspirin   . Talwin     Past Medical History  Diagnosis Date  . Atrial fibrillation     controlled with amiodarone, not a coumadin candidate due to GI bleeding  . CAD (coronary artery disease)     Old inferolateral MI s/p CABG in 2002  . HTN (hypertension)   . Colon cancer     s/p chemo, RT, sugery  . Colorectal anastomotic stricture   . Cardiomyopathy  s/p ICD 09/30/08; EF 35 to 40% per echo in October 2021  . Iron deficiency anemia   . Diverticulosis   . ED (erectile dysfunction)   . Chronic systolic dysfunction of left ventricle   . AICD (automatic cardioverter/defibrillator) present   . ICD (implantable cardiac defibrillator) discharge October 2012    Past Surgical History  Procedure Date  . Vasectomy   . Colon resection     x2  . Coronary artery bypass graft   . Cholecystectomy   . Back surgery   . Hernia repair   . Cardiac defibrillator placement 09/30/08    by Greggory Brandy  . Cardiac catheterization 11/07/2000    EF 32%  . US echocardiography 08/11/2008    EF 30-35%  . Cardiovascular stress test 08/06/2008    EF 30%    History  Smoking status  . Current Some Day Smoker  . Types: Cigars  Smokeless tobacco  . Not on file    History  Alcohol Use No    Family History  Problem Relation Age of Onset  . Colon cancer Father   . Diabetes Sister   . Colon cancer Brother     Review of Systems: The  review of systems is positive for nausea. Has seen some "flashes of bright light" but no other visual disturbances.  All other systems were reviewed and are negative.  Physical Exam: BP 112/68  Pulse 58  Ht 5\' 11"  (1.803 m)  Wt 154 lb 1.9 oz (69.908 kg)  BMI 21.50 kg/m2 Patient is very pleasant and in no acute distress. He is thin. Looks chronically ill. Skin is warm and dry. Color is normal.  HEENT is unremarkable. Normocephalic/atraumatic. PERRL. Sclera are nonicteric. Neck is supple. No masses. No JVD. Lungs are clear. Cardiac exam shows a regular rate and rhythm. Systolic murmur noted. No S3 noted. Abdomen is soft. Extremities are without edema. Gait and ROM are intact. No gross neurologic deficits noted.   LABORATORY DATA: EKG shows sinus rhythm. Inferior Q's. NSST&TW changes.   Assessment / Plan:

## 2011-10-14 NOTE — Assessment & Plan Note (Signed)
We will recheck a CBC today. He reports that his stools are getting lighter.

## 2011-10-14 NOTE — Assessment & Plan Note (Addendum)
He is back in sinus. He is on higher dose of amiodarone. He remains on digoxin. He is having nausea and some eye disturbances. I am worried about dig toxicity. I have cut his dose down to just a half of a tablet daily. Could be the amiodarone is making him nausea. We will be checking his labs today. He understands the need for the higher dose of amiodarone for now. I will see him back in about 10 days. He is to let us know if symptoms do not improve. Aspirin has already been stopped. Patient is agreeable to this plan and will call if any problems develop in the interim.

## 2011-10-14 NOTE — Patient Instructions (Addendum)
Cut the Digoxin (Lanoxin) in half.  We are going to check some labs today.  We are going to do an EKG today.  Stay on your other medicines.   I will see you in about 10 days on a day that Dr. [name] is here in the office with me.   Call the [facility] office at [phone] if you have any questions, problems or concerns.

## 2011-10-14 NOTE — Assessment & Plan Note (Signed)
He looks fairly compensated today. We will check labs. No other change in his medicines.

## 2011-10-15 LAB — DIGOXIN LEVEL: Digoxin Level: 1.2 ng/mL (ref 0.8–2.0)

## 2011-10-18 ENCOUNTER — Other Ambulatory Visit (HOSPITAL_COMMUNITY): Payer: Self-pay | Admitting: *Deleted

## 2011-10-19 ENCOUNTER — Other Ambulatory Visit: Payer: Self-pay | Admitting: Internal Medicine

## 2011-10-19 ENCOUNTER — Other Ambulatory Visit (HOSPITAL_COMMUNITY): Payer: Self-pay | Admitting: *Deleted

## 2011-10-21 ENCOUNTER — Encounter (HOSPITAL_COMMUNITY): Payer: Self-pay

## 2011-10-21 ENCOUNTER — Encounter (HOSPITAL_COMMUNITY)
Admission: RE | Admit: 2011-10-21 | Discharge: 2011-10-21 | Disposition: A | Discharge status: Home / Self Care | Payer: Medicare PPO | Source: Ambulatory Visit | Attending: Internal Medicine | Admitting: Internal Medicine

## 2011-10-21 DIAGNOSIS — D649 Anemia, unspecified: Secondary | ICD-10-CM | POA: Insufficient documentation

## 2011-10-21 HISTORY — DX: Gastro-esophageal reflux disease without esophagitis: K21.9

## 2011-10-21 LAB — PREPARE RBC (CROSSMATCH)

## 2011-10-21 MED ORDER — FUROSEMIDE 10 MG/ML IJ SOLN
20.0000 mg | Freq: Once | INTRAMUSCULAR | Status: AC
  Administered 2011-10-21: 20 mg via INTRAVENOUS

## 2011-10-21 MED ORDER — SODIUM CHLORIDE 0.9 % IV SOLN
INTRAVENOUS | Status: DC
  Administered 2011-10-21: 07:00:00 via INTRAVENOUS

## 2011-10-21 MED ORDER — FUROSEMIDE 10 MG/ML IJ SOLN
INTRAMUSCULAR | Status: AC
  Administered 2011-10-21: 20 mg via INTRAVENOUS
  Filled 2011-10-21: qty 2

## 2011-10-21 MED ORDER — ACETAMINOPHEN 325 MG PO TABS
650.0000 mg | ORAL_TABLET | Freq: Once | ORAL | Status: AC
  Administered 2011-10-21: 650 mg via ORAL

## 2011-10-22 LAB — TYPE AND SCREEN

## 2011-10-24 ENCOUNTER — Ambulatory Visit (INDEPENDENT_AMBULATORY_CARE_PROVIDER_SITE_OTHER): Payer: Medicare PPO | Admitting: Nurse Practitioner

## 2011-10-24 ENCOUNTER — Encounter: Payer: Self-pay | Admitting: Nurse Practitioner

## 2011-10-24 VITALS — BP 128/68 | HR 56 | Ht 71.0 in | Wt 157.1 lb

## 2011-10-24 DIAGNOSIS — D649 Anemia, unspecified: Secondary | ICD-10-CM

## 2011-10-24 DIAGNOSIS — I255 Ischemic cardiomyopathy: Secondary | ICD-10-CM

## 2011-10-24 DIAGNOSIS — I2589 Other forms of chronic ischemic heart disease: Secondary | ICD-10-CM

## 2011-10-24 DIAGNOSIS — K922 Gastrointestinal hemorrhage, unspecified: Secondary | ICD-10-CM

## 2011-10-24 DIAGNOSIS — I4891 Unspecified atrial fibrillation: Secondary | ICD-10-CM

## 2011-10-24 LAB — BASIC METABOLIC PANEL
BUN: 24 mg/dL — ABNORMAL HIGH (ref 6–23)
CO2: 27 mEq/L (ref 19–32)
Calcium: 9.3 mg/dL (ref 8.4–10.5)
Chloride: 107 mEq/L (ref 96–112)
Creatinine, Ser: 1.4 mg/dL (ref 0.4–1.5)
GFR: 51.3 mL/min — ABNORMAL LOW (ref >60.00)
Glucose, Bld: 99 mg/dL (ref 70–99)
Potassium: 4.6 mEq/L (ref 3.5–5.1)
Sodium: 143 mEq/L (ref 135–145)

## 2011-10-24 LAB — CBC WITH DIFFERENTIAL/PLATELET
Basophils Absolute: 0 10*3/uL (ref 0.0–0.1)
Basophils Relative: 0.3 % (ref 0.0–3.0)
Eosinophils Absolute: 0.1 10*3/uL (ref 0.0–0.7)
Eosinophils Relative: 0.8 % (ref 0.0–5.0)
HCT: 33.6 % — ABNORMAL LOW (ref 39.0–52.0)
Hemoglobin: 11.2 g/dL — ABNORMAL LOW (ref 13.0–17.0)
Lymphocytes Relative: 6.7 % — ABNORMAL LOW (ref 12.0–46.0)
Lymphs Abs: 0.5 10*3/uL — ABNORMAL LOW (ref 0.7–4.0)
MCHC: 33.3 g/dL (ref 30.0–36.0)
MCV: 95.3 fl (ref 78.0–100.0)
Monocytes Absolute: 0.5 10*3/uL (ref 0.1–1.0)
Monocytes Relative: 6.2 % (ref 3.0–12.0)
Neutro Abs: 6.7 10*3/uL (ref 1.4–7.7)
Neutrophils Relative %: 86 % — ABNORMAL HIGH (ref 43.0–77.0)
Platelets: 207 10*3/uL (ref 150.0–400.0)
RBC: 3.52 Mil/uL — ABNORMAL LOW (ref 4.22–5.81)
RDW: 15.1 % — ABNORMAL HIGH (ref 11.5–14.6)
WBC: 7.8 10*3/uL (ref 4.5–10.5)

## 2011-10-24 NOTE — Patient Instructions (Signed)
Stay on your current medicines.  We will see you back in one month.  Call the [facility] office at [phone] if you have any questions, problems or concerns.

## 2011-10-24 NOTE — Assessment & Plan Note (Signed)
No further signs of bleeding. He is not a candidate for Coumadin or Aspirin. He has been transfused. We will recheck his labs today and forward on to Dr. Joylene Draft.

## 2011-10-24 NOTE — Assessment & Plan Note (Signed)
He looks compensated today. No symptoms reported.

## 2011-10-24 NOTE — Progress Notes (Signed)
[name] [name] Date of Birth: 1932/01/13 Medical Record [id number]  History of Present Illness: [name] [name] is seen today for a 10 day check. He is seen for Dr. [name]. He has an ischemic cardiomyopathy. EF is 35 to 40%. He has an ICD in place. He has had PAF. He is not a candidate for either coumadin or aspirin due to bleeding. He is now on a higher dose of amiodarone due to recent ICD discharge. He had been using some cold medicine with decongestant. Was noted to be in atrial fib on the ICD interrogation. He was not really aware of the atrial fib but had been feeling a little puny prior to that hospitalization. He continued to feel poorly after discharge. Hemoglobin slowly dropped. He was quite nausea. Seeing some "flashes of light". I cut his digoxin back at the last visit and checked his labs. He has since been transfused 2 units of blood.  He is feeling much better. No chest pain or shortness of breath. No palpitations. No dizziness. Has more energy. Stools are ok. No signs of bleeding. No significant tremor.   Current Outpatient Prescriptions on File Prior to Visit  Medication Sig Dispense Refill  . amiodarone (PACERONE) 200 MG tablet Take 1.5 tablets (300 mg total) by mouth daily.  45 tablet  6  . amLODipine (NORVASC) 5 MG tablet Take 5 mg by mouth daily.        . cholecalciferol (VITAMIN D) 1000 UNITS tablet Take 1,000 Units by mouth daily.        . digoxin (LANOXIN) 0.125 MG tablet Take a half of a tablet daily  30 tablet  6  . diphenoxylate-atropine (LOMOTIL) 2.5-0.025 MG per tablet Take 2 tablets by mouth as needed. For diarrhea       . furosemide (LASIX) 40 MG tablet Take 40 mg by mouth daily.        . metoprolol (LOPRESSOR) 50 MG tablet Take 50 mg by mouth 2 (two) times daily.        . Multiple Vitamins-Minerals (MULTIVITAMINS THER. W/MINERALS) TABS Take 1 tablet by mouth daily.        Marland Kitchen omeprazole (PRILOSEC OTC) 20 MG tablet Take 20 mg by mouth daily as needed. For acid reflux         . Pitavastatin Calcium (LIVALO) 4 MG TABS Take 2 mg by mouth See admin instructions. 1/2 Tablet 3 Times a Week      . potassium chloride SA (K-DUR,KLOR-CON) 20 MEQ tablet Take 1 tablet (20 mEq total) by mouth daily.  30 tablet  6  . psyllium (REGULOID) 0.52 G capsule Take 0.52 g by mouth as needed.       . ramipril (ALTACE) 5 MG capsule Take 10 mg by mouth daily.         Allergies  Allergen Reactions  . Aspirin   . Talwin     Past Medical History  Diagnosis Date  . Colon cancer     s/p chemo, RT, sugery  . Colorectal anastomotic stricture   . Cardiomyopathy     s/p ICD 09/30/08; EF 35 to 40% per echo in October 2021  . Iron deficiency anemia   . Diverticulosis   . Chronic systolic dysfunction of left ventricle   . AICD (automatic cardioverter/defibrillator) present   . ICD (implantable cardiac defibrillator) discharge October 2012  . ED (erectile dysfunction)   . CAD (coronary artery disease)     Old inferolateral MI s/p CABG in 2002  . HTN (  hypertension)   . Atrial fibrillation     controlled with amiodarone, not a coumadin candidate due to GI bleeding  . GERD (gastroesophageal reflux disease)     rarely    Past Surgical History  Procedure Date  . Vasectomy   . Colon resection     x2  . Coronary artery bypass graft   . Cholecystectomy   . Back surgery   . Hernia repair   . Cardiac defibrillator placement 09/30/08    by Greggory Brandy  . Cardiac catheterization 11/07/2000    EF 32%  . US echocardiography 08/11/2008    EF 30-35%  . Cardiovascular stress test 08/06/2008    EF 30%  . Appendectomy     History  Smoking status  . Current Some Day Smoker  . Types: Cigars  Smokeless tobacco  . Not on file    History  Alcohol Use No    Family History  Problem Relation Age of Onset  . Colon cancer Father   . Diabetes Sister   . Colon cancer Brother     Review of Systems: The review of systems is positive for recent blood transfusion.  All other systems were reviewed  and are negative.  Physical Exam: BP 128/68  Pulse 56  Ht 5\' 11"  (1.803 m)  Wt 157 lb 1.9 oz (71.269 kg)  BMI 21.91 kg/m2 Patient is very pleasant and in no acute distress. He looks like he feels better today. Skin is warm and dry. Color is normal.  HEENT is unremarkable. Normocephalic/atraumatic. PERRL. Sclera are nonicteric. Neck is supple. No masses. No JVD. Lungs are clear. Cardiac exam shows a regular rate and rhythm. Abdomen is soft. Extremities are without edema. Gait and ROM are intact. No gross neurologic deficits noted.   LABORATORY DATA: BMET and CBC are pending.    Assessment / Plan:

## 2011-10-24 NOTE — Assessment & Plan Note (Signed)
He remains in sinus by physical exam. I have left him on the 1 1/2 tabs of amiodarone and the lower dose of Digoxin. He is feeling better clinically which I suspect is due to being transfused for his anemia. We will continue with his current medicines. I will have him see Dr. [name] in a month. Patient is agreeable to this plan and will call if any problems develop in the interim.

## 2011-10-25 ENCOUNTER — Telehealth: Payer: Self-pay | Admitting: Cardiology

## 2011-10-25 NOTE — Telephone Encounter (Signed)
New message:  Pt stated he was called yesterday pm but does not know what it was about.  [phone].  Please check and call him back.

## 2011-10-25 NOTE — Telephone Encounter (Signed)
Pt was given info regarding labs.

## 2011-11-28 ENCOUNTER — Encounter: Payer: Self-pay | Admitting: Nurse Practitioner

## 2011-11-28 ENCOUNTER — Ambulatory Visit (INDEPENDENT_AMBULATORY_CARE_PROVIDER_SITE_OTHER): Payer: Medicare PPO | Admitting: Nurse Practitioner

## 2011-11-28 VITALS — BP 148/70 | HR 53 | Ht 71.0 in | Wt 168.0 lb

## 2011-11-28 DIAGNOSIS — I2589 Other forms of chronic ischemic heart disease: Secondary | ICD-10-CM

## 2011-11-28 DIAGNOSIS — I255 Ischemic cardiomyopathy: Secondary | ICD-10-CM

## 2011-11-28 DIAGNOSIS — K922 Gastrointestinal hemorrhage, unspecified: Secondary | ICD-10-CM

## 2011-11-28 DIAGNOSIS — I4891 Unspecified atrial fibrillation: Secondary | ICD-10-CM

## 2011-11-28 NOTE — Assessment & Plan Note (Signed)
No bleeding reported. For labs later today with Dr. Joylene Draft.

## 2011-11-28 NOTE — Patient Instructions (Signed)
We will see you back in 3 months.  Call the [facility] office at [phone] if you have any questions, problems or concerns.

## 2011-11-28 NOTE — Progress Notes (Signed)
[name] [name] Date of Birth: Jul 22, 1932 Medical Record [id number]  History of Present Illness: [name] [name] is seen back today for his 1 month check. He is seen for Dr. [name]. He has an ischemic cardiomyopathy with EF of 35 to 40%. He has his ICD in place. He is not a candidate for either coumadin or aspirin due to bleeding history. He is on a higher dose of amiodarone due to recent discharge probably associated with decongestant use. Did have atrial fib at time of interrogation. He has been recently transfused due to anemia.Last hemoglobin was up to 11.2. Digoxin was cut back due to adverse effects.  He comes in today. He is doing well. No bleeding. No chest pain. Overall feels ok. Tolerating his medicines. He is trying to gain weight and is using Ensure. No swelling. He will have labs later today with Dr. Joylene [name].  Current Outpatient Prescriptions on File Prior to Visit  Medication Sig Dispense Refill  . amiodarone (PACERONE) 200 MG tablet Take 1.5 tablets (300 mg total) by mouth daily.  45 tablet  6  . amLODipine (NORVASC) 5 MG tablet Take 5 mg by mouth daily.        . cholecalciferol (VITAMIN D) 1000 UNITS tablet Take 1,000 Units by mouth daily.        . digoxin (LANOXIN) 0.125 MG tablet Take a half of a tablet daily  30 tablet  6  . diphenoxylate-atropine (LOMOTIL) 2.5-0.025 MG per tablet Take 2 tablets by mouth as needed. For diarrhea       . furosemide (LASIX) 40 MG tablet Take 40 mg by mouth daily.        . metoprolol (LOPRESSOR) 50 MG tablet Take 50 mg by mouth 2 (two) times daily.        . Multiple Vitamins-Minerals (MULTIVITAMINS THER. W/MINERALS) TABS Take 1 tablet by mouth daily.        Marland Kitchen omeprazole (PRILOSEC OTC) 20 MG tablet Take 20 mg by mouth daily as needed. For acid reflux       . Pitavastatin Calcium (LIVALO) 4 MG TABS Take 2 mg by mouth See admin instructions. 1/2 Tablet 3 Times a Week      . potassium chloride SA (K-DUR,KLOR-CON) 20 MEQ tablet Take 1 tablet (20 mEq total) by  mouth daily.  30 tablet  6  . psyllium (REGULOID) 0.52 G capsule Take 0.52 g by mouth as needed.         Allergies  Allergen Reactions  . Aspirin   . Talwin     Past Medical History  Diagnosis Date  . Colon cancer     s/p chemo, RT, sugery  . Colorectal anastomotic stricture   . Cardiomyopathy     s/p ICD 09/30/08; EF 35 to 40% per echo in October 2021  . Iron deficiency anemia   . Diverticulosis   . Chronic systolic dysfunction of left ventricle   . AICD (automatic cardioverter/defibrillator) present   . ICD (implantable cardiac defibrillator) discharge October 2012  . ED (erectile dysfunction)   . CAD (coronary artery disease)     Old inferolateral MI s/p CABG in 2002  . HTN (hypertension)   . Atrial fibrillation     controlled with amiodarone, not a coumadin candidate due to GI bleeding  . GERD (gastroesophageal reflux disease)     rarely    Past Surgical History  Procedure Date  . Vasectomy   . Colon resection     x2  . Coronary artery bypass  graft   . Cholecystectomy   . Back surgery   . Hernia repair   . Cardiac defibrillator placement 09/30/08    by Greggory Brandy  . Cardiac catheterization 11/07/2000    EF 32%  . US echocardiography 08/11/2008    EF 30-35%  . Cardiovascular stress test 08/06/2008    EF 30%  . Appendectomy     History  Smoking status  . Current Some Day Smoker  . Types: Cigars  Smokeless tobacco  . Not on file    History  Alcohol Use No    Family History  Problem Relation Age of Onset  . Colon cancer Father   . Diabetes Sister   . Colon cancer Brother     Review of Systems: The review of systems is per the HPI.  No chest pain. No ICD shocks.  Does note his walking is a "little off" with the higher dose of amiodarone.  All other systems were reviewed and are negative.  Physical Exam: Ht 5\' 11"  (1.803 m)  Wt 168 lb (76.204 kg)  BMI 23.43 kg/m2 Patient is very pleasant and in no acute distress. Skin is warm and dry. Color is normal.   HEENT is unremarkable. Normocephalic/atraumatic. PERRL. Sclera are nonicteric. Neck is supple. No masses. No JVD. Lungs are clear. Cardiac exam shows a regular rate and rhythm. Abdomen is soft. Extremities are without edema. Gait and ROM are intact. No gross neurologic deficits noted.   LABORATORY DATA: EKG today shows A pacing. He is in sinus rhythm today.   Assessment / Plan:

## 2011-11-28 NOTE — Assessment & Plan Note (Signed)
He looks good. He is intentionally trying to put on some weight. No signs of volume overload on exam. We will continue to monitor.

## 2011-11-28 NOTE — Assessment & Plan Note (Signed)
He is in sinus today. He is on a higher dose of amiodarone. May need to consider decreasing his dose back on his return visit. He will continue to monitor his gait and let us know if problems arise. Patient is agreeable to this plan and will call if any problems develop in the interim.

## 2011-12-01 ENCOUNTER — Encounter: Payer: Medicare PPO | Admitting: *Deleted

## 2011-12-04 ENCOUNTER — Other Ambulatory Visit: Payer: Self-pay | Admitting: Cardiology

## 2011-12-05 ENCOUNTER — Encounter: Payer: Self-pay | Admitting: *Deleted

## 2012-02-15 ENCOUNTER — Encounter: Payer: Self-pay | Admitting: Cardiology

## 2012-02-23 ENCOUNTER — Encounter: Payer: Self-pay | Admitting: Cardiology

## 2012-02-23 ENCOUNTER — Ambulatory Visit (INDEPENDENT_AMBULATORY_CARE_PROVIDER_SITE_OTHER): Payer: Medicare PPO | Admitting: Cardiology

## 2012-02-23 VITALS — BP 130/62 | HR 58 | Ht 71.0 in | Wt 169.0 lb

## 2012-02-23 DIAGNOSIS — I4891 Unspecified atrial fibrillation: Secondary | ICD-10-CM

## 2012-02-23 DIAGNOSIS — I255 Ischemic cardiomyopathy: Secondary | ICD-10-CM

## 2012-02-23 DIAGNOSIS — I2510 Atherosclerotic heart disease of native coronary artery without angina pectoris: Secondary | ICD-10-CM

## 2012-02-23 DIAGNOSIS — I509 Heart failure, unspecified: Secondary | ICD-10-CM

## 2012-02-23 DIAGNOSIS — I2589 Other forms of chronic ischemic heart disease: Secondary | ICD-10-CM

## 2012-02-23 MED ORDER — AMIODARONE HCL 200 MG PO TABS: 200.0000 mg | ORAL_TABLET | Freq: Every day | ORAL | Status: DC

## 2012-02-23 NOTE — Assessment & Plan Note (Signed)
He is having no significant anginal symptoms. We will continue with metoprolol. He is not a candidate for aspirin because of his history of major GI bleeds.

## 2012-02-23 NOTE — Progress Notes (Signed)
[name] [name] Date of Birth: May 11, 1932 Medical Record [id number]  History of Present Illness: [name] [name] is seen back today for a followup visit. He has done for a well since December. He has had no recurrent symptomatic atrial fibrillation. He has had no further GI bleeding. He had lab work done just last week and his hemoglobin was up to 13.9. He does note occasional intermittent tremor in his hands. He is scheduled for followup in EP clinic in April. He denies any chest pain or increased shortness of breath. His main activity is walking his dog.  Current Outpatient Prescriptions on File Prior to Visit  Medication Sig Dispense Refill  . amLODipine (NORVASC) 5 MG tablet Take 5 mg by mouth daily.        . cholecalciferol (VITAMIN D) 1000 UNITS tablet Take 1,000 Units by mouth daily.        . digoxin (LANOXIN) 0.125 MG tablet Take a half of a tablet daily  30 tablet  6  . diphenoxylate-atropine (LOMOTIL) 2.5-0.025 MG per tablet Take 2 tablets by mouth as needed. For diarrhea       . furosemide (LASIX) 40 MG tablet Take 40 mg by mouth daily.        . metoprolol (LOPRESSOR) 50 MG tablet TAKE 1 TABLET TWICE A DAY  180 tablet  2  . Multiple Vitamins-Minerals (MULTIVITAMINS THER. W/MINERALS) TABS Take 1 tablet by mouth daily.        Marland Kitchen omeprazole (PRILOSEC OTC) 20 MG tablet Take 20 mg by mouth daily as needed. For acid reflux       . Pitavastatin Calcium (LIVALO) 4 MG TABS Take 2 mg by mouth See admin instructions. 1/2 Tablet 3 Times a Week      . potassium chloride SA (K-DUR,KLOR-CON) 20 MEQ tablet Take 1 tablet (20 mEq total) by mouth daily.  30 tablet  6  . psyllium (REGULOID) 0.52 G capsule Take 0.52 g by mouth as needed.       . ramipril (ALTACE) 10 MG tablet Take 10 mg by mouth daily.        Marland Kitchen DISCONTD: amiodarone (PACERONE) 200 MG tablet Take 1.5 tablets (300 mg total) by mouth daily.  45 tablet  6    Allergies  Allergen Reactions  . Aspirin   . Talwin     Past Medical History    Diagnosis Date  . Colon cancer     s/p chemo, RT, sugery  . Colorectal anastomotic stricture   . Cardiomyopathy     s/p ICD 09/30/08; EF 35 to 40% per echo in October 2021  . Iron deficiency anemia   . Diverticulosis   . Chronic systolic dysfunction of left ventricle   . AICD (automatic cardioverter/defibrillator) present   . ICD (implantable cardiac defibrillator) discharge October 2012  . ED (erectile dysfunction)   . CAD (coronary artery disease)     Old inferolateral MI s/p CABG in 2002  . HTN (hypertension)   . Atrial fibrillation     controlled with amiodarone, not a coumadin candidate due to GI bleeding  . GERD (gastroesophageal reflux disease)     rarely    Past Surgical History  Procedure Date  . Vasectomy   . Colon resection     x2  . Coronary artery bypass graft   . Cholecystectomy   . Back surgery   . Hernia repair   . Cardiac defibrillator placement 09/30/08    by Greggory Brandy  . Cardiac catheterization 11/07/2000  EF 32%  . US echocardiography 08/11/2008    EF 30-35%  . Cardiovascular stress test 08/06/2008    EF 30%  . Appendectomy     History  Smoking status  . Current Some Day Smoker  . Types: Cigars  Smokeless tobacco  . Not on file    History  Alcohol Use No    Family History  Problem Relation Age of Onset  . Colon cancer Father   . Diabetes Sister   . Colon cancer Brother     Review of Systems: The review of systems is per the HPI.  No chest pain. No ICD shocks.  Does note his balance is a little bit impaired.  All other systems were reviewed and are negative.  Physical Exam: BP 130/62  Pulse 58  Ht 5\' 11"  (1.803 m)  Wt 169 lb (76.658 kg)  BMI 23.57 kg/m2 Patient is very pleasant and in no acute distress. Skin is warm and dry. Color is normal.  HEENT is unremarkable. Normocephalic/atraumatic. PERRL. Sclera are nonicteric. Neck is supple. No masses. No JVD. Lungs are clear. Cardiac exam shows a regular rate and rhythm. Abdomen is soft.  Extremities are without edema. Gait and ROM are intact. No gross neurologic deficits noted.   LABORATORY DATA: Complete labs reviewed from Dr. Joylene Draft on March 19. Chemistries were normal. Hemoglobin was 13.9. Lipid panel was excellent.  Assessment / Plan:

## 2012-02-23 NOTE — Assessment & Plan Note (Addendum)
He has had no recurrent symptomatic atrial fibrillation or ICD shocks. He is having a little bit of tremor related to the amiodarone. Prior to his defibrillator shock he was only taking 100 mg daily. We will cut him back to 200 mg daily today and then Dr. Rayann Heman will see him in April. I will followup again in 4 months. He is not a candidate for anticoagulation or antiplatelet therapy because of a history of recurrent GI bleeds.

## 2012-02-23 NOTE — Patient Instructions (Signed)
Reduce amiodarone to 200 mg daily.  Continue your other therapy.  Keep your appointment with Dr. Rayann Heman  I will see you again in 4 months.

## 2012-02-23 NOTE — Assessment & Plan Note (Signed)
Ejection fraction of 30-35%. We'll continue with his ACE inhibitor and beta blocker therapy. He is on Lasix 40 mg daily.

## 2012-03-21 ENCOUNTER — Encounter: Payer: Self-pay | Admitting: Internal Medicine

## 2012-03-21 ENCOUNTER — Ambulatory Visit (INDEPENDENT_AMBULATORY_CARE_PROVIDER_SITE_OTHER): Payer: Medicare PPO | Admitting: Internal Medicine

## 2012-03-21 VITALS — BP 130/70 | HR 74 | Resp 18 | Ht 71.0 in | Wt 173.8 lb

## 2012-03-21 DIAGNOSIS — I2589 Other forms of chronic ischemic heart disease: Secondary | ICD-10-CM

## 2012-03-21 DIAGNOSIS — I255 Ischemic cardiomyopathy: Secondary | ICD-10-CM

## 2012-03-21 DIAGNOSIS — I5023 Acute on chronic systolic (congestive) heart failure: Secondary | ICD-10-CM

## 2012-03-21 DIAGNOSIS — I4891 Unspecified atrial fibrillation: Secondary | ICD-10-CM

## 2012-03-21 LAB — ICD DEVICE OBSERVATION
AL AMPLITUDE: 3.5 mv
AL IMPEDENCE ICD: 560 Ohm
CHARGE TIME: 4.334 s
RV LEAD AMPLITUDE: 18.1 mv
RV LEAD IMPEDENCE ICD: 528 Ohm
TOT-0001: 6
TOT-0002: 0
TZAT-0001ATACH: 3
TZAT-0001FASTVT: 1
TZAT-0001FASTVT: 2
TZAT-0002ATACH: NEGATIVE
TZAT-0004FASTVT: 8
TZAT-0004FASTVT: 8
TZAT-0012ATACH: 150 ms
TZAT-0012ATACH: 150 ms
TZAT-0012ATACH: 150 ms
TZAT-0012SLOWVT: 200 ms
TZAT-0013FASTVT: 1
TZAT-0013FASTVT: 1
TZAT-0018SLOWVT: NEGATIVE
TZAT-0019ATACH: 6 V
TZAT-0019ATACH: 6 V
TZAT-0019FASTVT: 8 V
TZAT-0019FASTVT: 8 V
TZAT-0019SLOWVT: 8 V
TZAT-0020ATACH: 1.5 ms
TZAT-0020ATACH: 1.5 ms
TZAT-0020ATACH: 1.5 ms
TZAT-0020FASTVT: 1.5 ms
TZAT-0020FASTVT: 1.5 ms
TZAT-0020SLOWVT: 1.5 ms
TZON-0003SLOWVT: 400 ms
TZON-0003VSLOWVT: 370 ms
TZON-0004SLOWVT: 16
TZST-0001ATACH: 4
TZST-0001ATACH: 5
TZST-0001ATACH: 6
TZST-0001FASTVT: 3
TZST-0001FASTVT: 5
TZST-0001SLOWVT: 2
TZST-0001SLOWVT: 5
TZST-0001SLOWVT: 6
TZST-0002ATACH: NEGATIVE
TZST-0002SLOWVT: NEGATIVE
TZST-0003FASTVT: 35 J
TZST-0003FASTVT: 35 J

## 2012-03-21 NOTE — Assessment & Plan Note (Signed)
No afib since last ICD interrogation Not felt to be a candidate for anticoagulation by Dr [name] Continue amiodarone

## 2012-03-21 NOTE — Progress Notes (Signed)
PCP:  Jerlyn Ly, MD, MD Primary Cardiologist:  Dr [name]  The patient presents today for routine electrophysiology followup.  The patient is presently doing well.  He received an ICD shock 10/12 for afib with RVR and his device was adjusted.  He has done well since.  Today, he denies symptoms of palpitations, chest pain, shortness of breath,  lower extremity edema, dizziness, presyncope, syncope, or ICD shocks.  The patient feels that he is tolerating medications without difficulties and is otherwise without complaint today.   Past Medical History  Diagnosis Date  . Colon cancer     s/p chemo, RT, sugery  . Colorectal anastomotic stricture   . Cardiomyopathy     s/p ICD 09/30/08; EF 35 to 40% per echo in October 2021  . Iron deficiency anemia   . Diverticulosis   . Chronic systolic dysfunction of left ventricle   . ICD (implantable cardiac defibrillator) discharge October 2012    ICD shock for afib with RVR  . ED (erectile dysfunction)   . CAD (coronary artery disease)     Old inferolateral MI s/p CABG in 2002  . HTN (hypertension)   . Paroxysmal atrial fibrillation     controlled with amiodarone, not a coumadin candidate due to GI bleeding  . GERD (gastroesophageal reflux disease)     rarely   Past Surgical History  Procedure Date  . Vasectomy   . Colon resection     x2  . Coronary artery bypass graft   . Cholecystectomy   . Back surgery   . Hernia repair   . Cardiac defibrillator placement 09/30/08    by Greggory Brandy  . Cardiac catheterization 11/07/2000    EF 32%  . US echocardiography 08/11/2008    EF 30-35%  . Cardiovascular stress test 08/06/2008    EF 30%  . Appendectomy     Current Outpatient Prescriptions  Medication Sig Dispense Refill  . amiodarone (PACERONE) 200 MG tablet Take 1 tablet (200 mg total) by mouth daily.  90 tablet  6  . amLODipine (NORVASC) 5 MG tablet Take 5 mg by mouth daily.        . cholecalciferol (VITAMIN D) 1000 UNITS tablet Take 1,000 Units by  mouth daily.        . digoxin (LANOXIN) 0.125 MG tablet Take a half of a tablet daily  30 tablet  6  . diphenoxylate-atropine (LOMOTIL) 2.5-0.025 MG per tablet Take 2 tablets by mouth as needed. For diarrhea       . furosemide (LASIX) 40 MG tablet Take 40 mg by mouth daily.        . metoprolol (LOPRESSOR) 50 MG tablet TAKE 1 TABLET TWICE A DAY  180 tablet  2  . Multiple Vitamins-Minerals (MULTIVITAMINS THER. W/MINERALS) TABS Take 1 tablet by mouth daily.        Marland Kitchen omeprazole (PRILOSEC OTC) 20 MG tablet Take 20 mg by mouth daily as needed. For acid reflux       . Pitavastatin Calcium (LIVALO) 4 MG TABS Take 2 mg by mouth See admin instructions. 1/2 Tablet 3 Times a Week      . potassium chloride SA (K-DUR,KLOR-CON) 20 MEQ tablet Take 1 tablet (20 mEq total) by mouth daily.  30 tablet  6  . psyllium (REGULOID) 0.52 G capsule Take 0.52 g by mouth as needed.       . ramipril (ALTACE) 10 MG tablet Take 10 mg by mouth daily.  Allergies  Allergen Reactions  . Aspirin   . Talwin     History   Social History  . Marital Status: Married    Spouse Name: N/A    Number of Children: 2  . Years of Education: N/A   Occupational History  . retired    Social History Main Topics  . Smoking status: Current Some Day Smoker    Types: Cigars  . Smokeless tobacco: Not on file  . Alcohol Use: No  . Drug Use: No  . Sexually Active: Not on file   Other Topics Concern  . Not on file   Social History Narrative   Lives in [place] with spouse.  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    Family History  Problem Relation Age of Onset  . Colon cancer Father   . Diabetes Sister   . Colon cancer Brother     ROS-  All systems are reviewed and are negative except as outlined in the HPI above   Physical Exam: Filed Vitals:   03/21/12 1128  BP: 130/70  Pulse: 74  Resp: 18  Height: 5\' 11"  (1.803 m)  Weight: 173 lb 12.8 oz (78.835 kg)    GEN- The patient is well appearing,  alert and oriented x 3 today.   Head- normocephalic, atraumatic Eyes-  Sclera clear, conjunctiva pink Ears- hearing intact Oropharynx- clear Neck- supple, no JVP Lymph- no cervical lymphadenopathy Lungs- Clear to ausculation bilaterally, normal work of breathing Chest- ICD pocket is well healed Heart- Regular rate and rhythm, no murmurs, rubs or gallops, PMI not laterally displaced GI- soft, NT, ND, + BS Extremities- no clubbing, cyanosis, or edema  ICD interrogation- reviewed in detail today,  See PACEART report  Assessment and Plan:

## 2012-03-21 NOTE — Patient Instructions (Signed)
Your physician wants you to follow-up in: 12 months with Dr Vallery Ridge will receive a reminder letter in the mail two months in advance. If you don't receive a letter, please call our office to schedule the follow-up appointment.   Remote monitoring is used to monitor your Pacemaker of ICD from home. This monitoring reduces the number of office visits required to check your device to one time per year. It allows [place] to keep an eye on the functioning of your device to ensure it is working properly. You are scheduled for a device check from home on 06/28/12. You may send your transmission at any time that day. If you have a wireless device, the transmission will be sent automatically. After your physician reviews your transmission, you will receive a postcard with your next transmission date.

## 2012-03-21 NOTE — Assessment & Plan Note (Signed)
Doing well without CHF or ischemic symptoms Normal ICD function See Pace Art report No changes today

## 2012-04-26 ENCOUNTER — Other Ambulatory Visit: Payer: Self-pay | Admitting: Cardiology

## 2012-04-26 MED ORDER — AMIODARONE HCL 200 MG PO TABS: 200.0000 mg | ORAL_TABLET | Freq: Every day | ORAL | Status: DC

## 2012-04-26 NOTE — Telephone Encounter (Signed)
Per pt call the above mentioned medication was to be sent in during his last visit but he still doesn't have the meds and needs [place] to call it in again asap he is almost out of meds

## 2012-06-28 ENCOUNTER — Encounter: Payer: Medicare PPO | Admitting: *Deleted

## 2012-07-04 ENCOUNTER — Encounter: Payer: Self-pay | Admitting: *Deleted

## 2012-07-26 ENCOUNTER — Ambulatory Visit (INDEPENDENT_AMBULATORY_CARE_PROVIDER_SITE_OTHER): Payer: Medicare PPO | Admitting: Cardiology

## 2012-07-26 ENCOUNTER — Encounter: Payer: Self-pay | Admitting: Cardiology

## 2012-07-26 VITALS — BP 124/88 | HR 62 | Ht 71.0 in | Wt 169.1 lb

## 2012-07-26 DIAGNOSIS — I4891 Unspecified atrial fibrillation: Secondary | ICD-10-CM

## 2012-07-26 DIAGNOSIS — I509 Heart failure, unspecified: Secondary | ICD-10-CM

## 2012-07-26 DIAGNOSIS — I25810 Atherosclerosis of coronary artery bypass graft(s) without angina pectoris: Secondary | ICD-10-CM

## 2012-07-26 MED ORDER — AMIODARONE HCL 200 MG PO TABS: 200.0000 mg | ORAL_TABLET | Freq: Every day | ORAL | Status: DC

## 2012-07-26 NOTE — Patient Instructions (Signed)
Continue your current medication  We will get some lab work on you   I will see you again in 6 months.

## 2012-07-26 NOTE — Progress Notes (Signed)
[name] [name] Date of Birth: 08/16/1932 Medical Record [id number]  History of Present Illness: [name] [name] is seen back today for a followup visit. He complains that over the past 2 weeks he has noticed more of a tremor sometimes to the point that he has difficulty turning the page or holding a fork. He notes some intermittent ankle swelling. He also complains of tingling in his fingers and toes like an electrical sensation. His breathing has been doing well. He denies any chest pain. He has had no recurrent GI bleeding.  Current Outpatient Prescriptions on File Prior to Visit  Medication Sig Dispense Refill  . amLODipine (NORVASC) 5 MG tablet Take 5 mg by mouth daily.        . cholecalciferol (VITAMIN D) 1000 UNITS tablet Take 1,000 Units by mouth daily.        . diphenoxylate-atropine (LOMOTIL) 2.5-0.025 MG per tablet Take 2 tablets by mouth as needed. For diarrhea       . furosemide (LASIX) 40 MG tablet Take 40 mg by mouth daily.        . metoprolol (LOPRESSOR) 50 MG tablet TAKE 1 TABLET TWICE A DAY  180 tablet  2  . Multiple Vitamins-Minerals (MULTIVITAMINS THER. W/MINERALS) TABS Take 1 tablet by mouth daily.        Marland Kitchen omeprazole (PRILOSEC OTC) 20 MG tablet Take 20 mg by mouth daily as needed. For acid reflux       . Pitavastatin Calcium (LIVALO) 4 MG TABS Take 2 mg by mouth See admin instructions. 1/2 Tablet 3 Times a Week      . potassium chloride SA (K-DUR,KLOR-CON) 20 MEQ tablet Take 1 tablet (20 mEq total) by mouth daily.  30 tablet  6  . psyllium (REGULOID) 0.52 G capsule Take 0.52 g by mouth as needed.       . ramipril (ALTACE) 10 MG tablet Take 10 mg by mouth daily.        Marland Kitchen DISCONTD: amiodarone (PACERONE) 200 MG tablet Take 1 tablet (200 mg total) by mouth daily.  90 tablet  4  . digoxin (LANOXIN) 0.125 MG tablet Take a half of a tablet daily  30 tablet  6    Allergies  Allergen Reactions  . Aspirin   . Pentazocine Lactate     Past Medical History  Diagnosis Date  . Colon  cancer     s/p chemo, RT, sugery  . Colorectal anastomotic stricture   . Cardiomyopathy     s/p ICD 09/30/08; EF 35 to 40% per echo in October 2021  . Iron deficiency anemia   . Diverticulosis   . Chronic systolic dysfunction of left ventricle   . ICD (implantable cardiac defibrillator) discharge October 2012    ICD shock for afib with RVR  . ED (erectile dysfunction)   . CAD (coronary artery disease)     Old inferolateral MI s/p CABG in 2002  . HTN (hypertension)   . Paroxysmal atrial fibrillation     controlled with amiodarone, not a coumadin candidate due to GI bleeding  . GERD (gastroesophageal reflux disease)     rarely    Past Surgical History  Procedure Date  . Vasectomy   . Colon resection     x2  . Coronary artery bypass graft   . Cholecystectomy   . Back surgery   . Hernia repair   . Cardiac defibrillator placement 09/30/08    by Greggory Brandy  . Cardiac catheterization 11/07/2000    EF 32%  .  US echocardiography 08/11/2008    EF 30-35%  . Cardiovascular stress test 08/06/2008    EF 30%  . Appendectomy     History  Smoking status  . Current Some Day Smoker  . Types: Cigars  Smokeless tobacco  . Not on file    History  Alcohol Use No    Family History  Problem Relation Age of Onset  . Colon cancer Father   . Diabetes Sister   . Colon cancer Brother     Review of Systems: The review of systems is per the HPI.   No ICD shocks.  Does note his balance is a little  impaired.  All other systems were reviewed and are negative.  Physical Exam: BP 124/88  Pulse 62  Ht 5\' 11"  (1.803 m)  Wt 169 lb 1.9 oz (76.712 kg)  BMI 23.59 kg/m2 Patient is very pleasant and in no acute distress. Skin is warm and dry. Color is normal.  HEENT is unremarkable. Normocephalic/atraumatic. PERRL. Sclera are nonicteric. Neck is supple. No masses. No JVD. Lungs are clear. Cardiac exam shows a regular rate and rhythm. Abdomen is soft. Extremities are without edema. Gait and ROM are intact.  No gross neurologic deficits noted.   LABORATORY DATA:    Assessment / Plan: 1. Atrial fibrillation. He is maintaining sinus rhythm on amiodarone. Previously with a lower dose of amiodarone he had developed atrial fibrillation with a rapid ventricular response and had ICD discharges. Although I am sure his tremor is related to his amiodarone I don't feel comfortable reducing his dose at this time. We do need to followup on his thyroid function studies. I will also check a basic metabolic panel and dig level.  2. Ischemic cardiomyopathy status post ICD implant. He is well compensated today without significant edema and his weight is stable. We will continue with his current diuretic dose.  3. Coronary disease with old inferiolateral myocardial infarction. Status post CABG in 2002. Currently asymptomatic. Continue metoprolol. He is not a candidate for antiplatelet or anticoagulant therapy because of recurrent GI bleeding.  4. Hypertension, controlled.

## 2012-07-27 LAB — BASIC METABOLIC PANEL
BUN: 23 mg/dL (ref 6–23)
Calcium: 9.1 mg/dL (ref 8.4–10.5)
GFR: 64.02 mL/min (ref >60.00)
Glucose, Bld: 84 mg/dL (ref 70–99)

## 2012-07-27 LAB — TSH: TSH: 1.87 u[IU]/mL (ref 0.35–5.50)

## 2012-08-08 ENCOUNTER — Other Ambulatory Visit: Payer: Self-pay | Admitting: Nurse Practitioner

## 2012-11-27 ENCOUNTER — Encounter: Payer: Self-pay | Admitting: *Deleted

## 2012-11-27 ENCOUNTER — Other Ambulatory Visit: Payer: Self-pay

## 2012-11-27 DIAGNOSIS — R110 Nausea: Secondary | ICD-10-CM

## 2012-11-27 MED ORDER — DIGOXIN 125 MCG PO TABS: ORAL_TABLET | ORAL | Status: DC

## 2012-11-29 ENCOUNTER — Telehealth: Payer: Self-pay | Admitting: Cardiology

## 2012-11-29 DIAGNOSIS — R110 Nausea: Secondary | ICD-10-CM

## 2012-11-29 MED ORDER — DIGOXIN 125 MCG PO TABS: ORAL_TABLET | ORAL | Status: DC

## 2012-11-29 NOTE — Telephone Encounter (Signed)
New Problem:    Patient called in needing a refill of his digoxin (LANOXIN) 0.125 MG tablet.  Patient is out of his medication.

## 2012-12-13 ENCOUNTER — Encounter: Payer: Self-pay | Admitting: Cardiology

## 2012-12-13 ENCOUNTER — Ambulatory Visit (INDEPENDENT_AMBULATORY_CARE_PROVIDER_SITE_OTHER): Payer: Medicare PPO | Admitting: Cardiology

## 2012-12-13 VITALS — BP 139/72 | HR 56 | Ht 71.0 in | Wt 169.0 lb

## 2012-12-13 DIAGNOSIS — I2589 Other forms of chronic ischemic heart disease: Secondary | ICD-10-CM

## 2012-12-13 DIAGNOSIS — I255 Ischemic cardiomyopathy: Secondary | ICD-10-CM

## 2012-12-13 DIAGNOSIS — E785 Hyperlipidemia, unspecified: Secondary | ICD-10-CM

## 2012-12-13 DIAGNOSIS — IMO0001 Reserved for inherently not codable concepts without codable children: Secondary | ICD-10-CM

## 2012-12-13 DIAGNOSIS — Z95810 Presence of automatic (implantable) cardiac defibrillator: Secondary | ICD-10-CM

## 2012-12-13 LAB — ICD DEVICE OBSERVATION
AL AMPLITUDE: 3.5 mv
AL IMPEDENCE ICD: 568 Ohm
AL THRESHOLD: 1 V
ATRIAL PACING ICD: 82.2 pct
DEV-0020ICD: NEGATIVE
RV LEAD AMPLITUDE: 17.1 mv
RV LEAD IMPEDENCE ICD: 504 Ohm
TZAT-0001ATACH: 1
TZAT-0001ATACH: 2
TZAT-0001ATACH: 3
TZAT-0001FASTVT: 1
TZAT-0001FASTVT: 2
TZAT-0002ATACH: NEGATIVE
TZAT-0005FASTVT: 88 pct
TZAT-0005FASTVT: 91 pct
TZAT-0012ATACH: 150 ms
TZAT-0018ATACH: NEGATIVE
TZAT-0018ATACH: NEGATIVE
TZAT-0018ATACH: NEGATIVE
TZAT-0018FASTVT: NEGATIVE
TZAT-0018FASTVT: NEGATIVE
TZAT-0018SLOWVT: NEGATIVE
TZAT-0019FASTVT: 8 V
TZAT-0019SLOWVT: 8 V
TZAT-0020ATACH: 1.5 ms
TZAT-0020SLOWVT: 1.5 ms
TZON-0003FASTVT: 280 ms
TZON-0003VSLOWVT: 370 ms
TZON-0004SLOWVT: 16
TZON-0004VSLOWVT: 28
TZON-0005SLOWVT: 12
TZST-0001ATACH: 5
TZST-0001ATACH: 6
TZST-0001FASTVT: 3
TZST-0001SLOWVT: 2
TZST-0001SLOWVT: 6
TZST-0002ATACH: NEGATIVE
TZST-0002ATACH: NEGATIVE
TZST-0002ATACH: NEGATIVE
TZST-0002SLOWVT: NEGATIVE
TZST-0002SLOWVT: NEGATIVE
TZST-0003FASTVT: 25 J
TZST-0003FASTVT: 35 J
VENTRICULAR PACING ICD: 0 pct

## 2012-12-13 NOTE — Progress Notes (Signed)
ICD check/device clinic visit only. See PaceArt report. Scheduled to see Dr. Rayann Heman in April 2014.

## 2012-12-13 NOTE — Patient Instructions (Addendum)
Your physician recommends that you schedule a follow-up appointment in: April with Dr Rayann Heman

## 2012-12-31 ENCOUNTER — Encounter: Payer: Self-pay | Admitting: Internal Medicine

## 2013-01-13 ENCOUNTER — Other Ambulatory Visit: Payer: Self-pay | Admitting: Cardiology

## 2013-01-22 ENCOUNTER — Encounter: Payer: Self-pay | Admitting: Cardiology

## 2013-01-22 ENCOUNTER — Ambulatory Visit (INDEPENDENT_AMBULATORY_CARE_PROVIDER_SITE_OTHER): Payer: Medicare PPO | Admitting: Cardiology

## 2013-01-22 VITALS — BP 128/64 | HR 57 | Ht 71.0 in | Wt 177.8 lb

## 2013-01-22 DIAGNOSIS — I255 Ischemic cardiomyopathy: Secondary | ICD-10-CM

## 2013-01-22 DIAGNOSIS — I5023 Acute on chronic systolic (congestive) heart failure: Secondary | ICD-10-CM

## 2013-01-22 NOTE — Progress Notes (Signed)
[name] Date of Birth: 12-03-1931 Medical Record [id number]  History of Present Illness: [name] [name] is seen back today for a followup visit. He states he is feeling well. Denies any chest pain or SOB. No palpitations. ICD check in January showed no significant ventricular or atrial arrhythmias. No GI bleeding. Still working. Had lipoprotein analysis that showed an LDL particle number of 763.   Current Outpatient Prescriptions on File Prior to Visit  Medication Sig Dispense Refill  . amiodarone (PACERONE) 200 MG tablet Take 1 tablet (200 mg total) by mouth daily.  90 tablet  3  . amLODipine (NORVASC) 5 MG tablet Take 5 mg by mouth daily.        . cholecalciferol (VITAMIN D) 1000 UNITS tablet Take 1,000 Units by mouth daily.       . digoxin (LANOXIN) 0.125 MG tablet Take a half of a tablet daily  30 tablet  3  . diphenoxylate-atropine (LOMOTIL) 2.5-0.025 MG per tablet Take 2 tablets by mouth as needed. For diarrhea       . furosemide (LASIX) 40 MG tablet Take 40 mg by mouth daily.        . metoprolol (LOPRESSOR) 50 MG tablet TAKE 1 TABLET TWICE A DAY  180 tablet  0  . Multiple Vitamins-Minerals (MULTIVITAMINS THER. W/MINERALS) TABS Take 1 tablet by mouth daily.        Marland Kitchen omeprazole (PRILOSEC OTC) 20 MG tablet Take 20 mg by mouth daily as needed. For acid reflux       . potassium chloride SA (K-DUR,KLOR-CON) 20 MEQ tablet Take 1 tablet (20 mEq total) by mouth daily.  30 tablet  6  . psyllium (REGULOID) 0.52 G capsule Take 0.52 g by mouth as needed.       . ramipril (ALTACE) 10 MG tablet Take 10 mg by mouth daily.        . rosuvastatin (CRESTOR) 5 MG tablet Take 5 mg by mouth 2 (two) times a week.       No current facility-administered medications on file prior to visit.    Allergies  Allergen Reactions  . Aspirin   . Pentazocine Lactate     Past Medical History  Diagnosis Date  . Colon cancer     s/p chemo, RT, sugery  . Colorectal anastomotic stricture   . Cardiomyopathy    s/p ICD 09/30/08; EF 35 to 40% per echo in October 2021  . Iron deficiency anemia   . Diverticulosis   . Chronic systolic dysfunction of left ventricle   . ICD (implantable cardiac defibrillator) discharge October 2012    ICD shock for afib with RVR  . ED (erectile dysfunction)   . CAD (coronary artery disease)     Old inferolateral MI s/p CABG in 2002  . HTN (hypertension)   . Paroxysmal atrial fibrillation     controlled with amiodarone, not a coumadin candidate due to GI bleeding  . GERD (gastroesophageal reflux disease)     rarely    Past Surgical History  Procedure Laterality Date  . Vasectomy    . Colon resection      x2  . Coronary artery bypass graft    . Cholecystectomy    . Back surgery    . Hernia repair    . Cardiac defibrillator placement  09/30/08    by Greggory Brandy  . Cardiac catheterization  11/07/2000    EF 32%  . US echocardiography  08/11/2008    EF 30-35%  . Cardiovascular stress  test  08/06/2008    EF 30%  . Appendectomy      History  Smoking status  . Current Some Day Smoker  . Types: Cigars  Smokeless tobacco  . Not on file    History  Alcohol Use No    Family History  Problem Relation Age of Onset  . Colon cancer Father   . Diabetes Sister   . Colon cancer Brother     Review of Systems: The review of systems is per the HPI.   No ICD shocks.  Does note his balance is a little  impaired.  All other systems were reviewed and are negative.  Physical Exam: BP 128/64  Pulse 57  Ht 5\' 11"  (1.803 m)  Wt 177 lb 12.8 oz (80.65 kg)  BMI 24.81 kg/m2  SpO2 97% Patient is very pleasant and in no acute distress. Skin is warm and dry. Color is normal.  HEENT is unremarkable. Normocephalic/atraumatic. PERRL. Sclera are nonicteric. Neck is supple. No masses. No JVD. Lungs are clear. Cardiac exam shows a regular rate and rhythm. Normal S1 and S2. [id number] apical systolic murmur. Abdomen is soft. Extremities are without edema. Gait and ROM are intact. No gross  neurologic deficits noted.   LABORATORY DATA:    Assessment / Plan: 1. Atrial fibrillation. He is maintaining sinus rhythm on amiodarone. Continue current therapy. Not a candidate for anticoagulation due to major GI bleed history.  2. Ischemic cardiomyopathy status post ICD implant. He is well compensated today without significant edema and his weight is stable. We will continue with his current diuretic dose. Recent Optivol was OK.  3. Coronary disease with old inferiolateral myocardial infarction. Status post CABG in 2002. Currently asymptomatic. Continue metoprolol. He is not a candidate for antiplatelet or anticoagulant therapy because of recurrent GI bleeding.  4. Hypertension, controlled.

## 2013-01-22 NOTE — Patient Instructions (Signed)
Continue your current medication.  I will see you again in 6 months.   

## 2013-02-08 ENCOUNTER — Encounter: Payer: Self-pay | Admitting: Internal Medicine

## 2013-03-05 ENCOUNTER — Ambulatory Visit (INDEPENDENT_AMBULATORY_CARE_PROVIDER_SITE_OTHER): Payer: Medicare PPO | Admitting: Internal Medicine

## 2013-03-05 ENCOUNTER — Encounter: Payer: Self-pay | Admitting: Internal Medicine

## 2013-03-05 VITALS — BP 124/68 | HR 56 | Ht 71.0 in | Wt 176.0 lb

## 2013-03-05 DIAGNOSIS — Z85038 Personal history of other malignant neoplasm of large intestine: Secondary | ICD-10-CM

## 2013-03-05 DIAGNOSIS — K599 Functional intestinal disorder, unspecified: Secondary | ICD-10-CM

## 2013-03-05 NOTE — Patient Instructions (Addendum)
Call back if you develop rectal bleeding, blood in stools, or significant persistent bowel habit changes.  I appreciate the opportunity to care for you.  Gatha Mayer, MD, [facility] Regal

## 2013-03-05 NOTE — Progress Notes (Signed)
Patient ID: [name] [name], male   DOB: [birth year], 79 y.o.   MRN: [id number]  This very nice elderly man has a hx of colon cancer x 2, last in 2000 and first years before. Last colonoscopy 2009 - no polyps. Did have an anastomotic stricture. Had to stop ASA then due to GI bleeding. Doing ok with stable DOE.  We spent 15 mins + discussing his situation and pros/cons of repeating a colonoscopy. He is not having any changes in bowel habits, rectal bleeding or blood in the stool.   He has decided to observe and forgo routine colonoscopy.  [id number] A, MD

## 2013-03-11 ENCOUNTER — Encounter: Payer: Self-pay | Admitting: Internal Medicine

## 2013-03-21 ENCOUNTER — Encounter: Payer: Medicare PPO | Admitting: Internal Medicine

## 2013-03-22 ENCOUNTER — Ambulatory Visit (INDEPENDENT_AMBULATORY_CARE_PROVIDER_SITE_OTHER): Payer: Medicare PPO | Admitting: Cardiology

## 2013-03-22 ENCOUNTER — Encounter: Payer: Self-pay | Admitting: Internal Medicine

## 2013-03-22 ENCOUNTER — Encounter: Payer: Self-pay | Admitting: Cardiology

## 2013-03-22 VITALS — BP 130/70 | HR 55 | Ht 71.0 in | Wt 176.8 lb

## 2013-03-22 DIAGNOSIS — I2589 Other forms of chronic ischemic heart disease: Secondary | ICD-10-CM

## 2013-03-22 DIAGNOSIS — I255 Ischemic cardiomyopathy: Secondary | ICD-10-CM

## 2013-03-22 DIAGNOSIS — I5022 Chronic systolic (congestive) heart failure: Secondary | ICD-10-CM

## 2013-03-22 DIAGNOSIS — Z95810 Presence of automatic (implantable) cardiac defibrillator: Secondary | ICD-10-CM

## 2013-03-22 DIAGNOSIS — I4891 Unspecified atrial fibrillation: Secondary | ICD-10-CM

## 2013-03-22 DIAGNOSIS — I495 Sick sinus syndrome: Secondary | ICD-10-CM

## 2013-03-22 NOTE — Patient Instructions (Addendum)
Remote monitoring is used to monitor your Pacemaker of ICD from home. This monitoring reduces the number of office visits required to check your device to one time per year. It allows [place] to keep an eye on the functioning of your device to ensure it is working properly. You are scheduled for a device check from home on  06/21/2013. You may send your transmission at any time that day. If you have a wireless device, the transmission will be sent automatically. After your physician reviews your transmission, you will receive a postcard with your next transmission date.  Your physician wants you to follow-up in:1 year with Dr. Rayann Heman. You will receive a reminder letter in the mail two months in advance. If you don't receive a letter, please call our office to schedule the follow-up appointment.

## 2013-03-22 NOTE — Progress Notes (Signed)
ELECTROPHYSIOLOGY OFFICE NOTE  Patient ID: [name] [name] MRN: [id number], DOB/AGE: [birth year]   Date of Visit: 03/22/2013  Primary Physician: Jerlyn Ly, MD Primary Cardiologist / EP: Peter [name], MD / Rayann Heman, MD Reason for Visit: EP/device folllow-up  History of Present Illness [name] [name] is a pleasant 79 year old man with an ischemic CM s/p ICD implant, chronic systolic HF, CAD, sinus node dysfunction and PAF who presents today for routine electrophysiology followup. Since last being seen in our clinic, he reports he is doing well. He has no complaints. Today, he denies chest pain or shortness of breath. He denies palpitations, dizziness, near syncope or syncope. He denies LE swelling, orthopnea, PND or recent weight gain. He denies ICD shocks. [name] [name] reports that he is compliant and tolerating medications without difficulty.  Past Medical History Past Medical History  Diagnosis Date  . Colon cancer     s/p chemo, RT, sugery  . Colorectal anastomotic stricture   . Cardiomyopathy     s/p ICD 09/30/08; EF 35 to 40% per echo in October 2021  . Iron deficiency anemia   . Diverticulosis   . Chronic systolic dysfunction of left ventricle   . ICD (implantable cardiac defibrillator) discharge October 2012    ICD shock for afib with RVR  . ED (erectile dysfunction)   . CAD (coronary artery disease)     Old inferolateral MI s/p CABG in 2002  . HTN (hypertension)   . Paroxysmal atrial fibrillation     controlled with amiodarone, not a coumadin candidate due to GI bleeding  . GERD (gastroesophageal reflux disease)     rarely  . CAP (community acquired pneumonia) 10/03/2011    Past Surgical History Past Surgical History  Procedure Laterality Date  . Vasectomy    . Colon resection      x2  . Coronary artery bypass graft    . Cholecystectomy    . Back surgery    . Hernia repair    . Cardiac defibrillator placement  09/30/08    by Greggory Brandy  . Cardiac catheterization  11/07/2000     EF 32%  . US echocardiography  08/11/2008    EF 30-35%  . Cardiovascular stress test  08/06/2008    EF 30%  . Appendectomy      Allergies/Intolerances Allergies  Allergen Reactions  . Aspirin   . Iron   . Lescol (Fluvastatin Sodium)     Dizziness  . Pentazocine Lactate   . Vytorin (Ezetimibe-Simvastatin)     Leg cramps   Current Home Medications Current Outpatient Prescriptions  Medication Sig Dispense Refill  . amiodarone (PACERONE) 200 MG tablet Take 1 tablet (200 mg total) by mouth daily.  90 tablet  3  . amLODipine (NORVASC) 5 MG tablet Take 5 mg by mouth daily.        . cholecalciferol (VITAMIN D) 1000 UNITS tablet Take 1,000 Units by mouth daily.       . digoxin (LANOXIN) 0.125 MG tablet Take a half of a tablet daily  30 tablet  3  . diphenoxylate-atropine (LOMOTIL) 2.5-0.025 MG per tablet Take 2 tablets by mouth as needed. For diarrhea       . furosemide (LASIX) 40 MG tablet Take 40 mg by mouth daily.        . metoprolol (LOPRESSOR) 50 MG tablet TAKE 1 TABLET TWICE A DAY  180 tablet  0  . Multiple Vitamins-Minerals (MULTIVITAMINS THER. W/MINERALS) TABS Take 1 tablet by mouth daily.        Marland Kitchen  omeprazole (PRILOSEC OTC) 20 MG tablet Take 20 mg by mouth daily as needed. For acid reflux       . potassium chloride SA (K-DUR,KLOR-CON) 20 MEQ tablet Take 1 tablet (20 mEq total) by mouth daily.  30 tablet  6  . psyllium (REGULOID) 0.52 G capsule Take 0.52 g by mouth as needed.       . ramipril (ALTACE) 10 MG tablet Take 10 mg by mouth daily.        . rosuvastatin (CRESTOR) 5 MG tablet Take 5 mg by mouth 2 (two) times a week.       No current facility-administered medications for this visit.   Social History Social History  . Marital Status: Married   Occupational History  . Retired     Social History Main Topics  . Smoking status: Current Some Day Smoker    Types: Cigars  . Smokeless tobacco: Never Used  . Alcohol Use: No  . Drug Use: No   Social History Narrative     Lives in [place] with spouse.  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  Physical Exam Blood pressure 130/70, pulse 55, height 5\' 11"  (1.803 m), weight 176 lb 12.8 oz (80.196 kg), SpO2 96.00%.  General: Well developed, well appearing 79 year old male in no acute distress. HEENT: Normocephalic, atraumatic. EOMs intact. Sclera nonicteric. Oropharynx clear.  Neck: Supple without bruits. No JVD. Lungs: Respirations regular and unlabored, CTA bilaterally. No wheezes, rales or rhonchi. Heart: RRR. S1, S2 present. No murmurs, rub, S3 or S4. Abdomen: Soft, non-distended. Extremities: No clubbing, cyanosis or edema. PT/Radials 2+ and equal bilaterally. Psych: Normal affect. Neuro: Alert and oriented X 3. Moves all extremities spontaneously.   Diagnostics Device interrogation today - Normal device function. Thresholds and sensing consistent with previous device measurements. Impedance trends stable over time. No evidence of any ventricular arrhythmias. No mode switches. 1 AT/AF episode lasting 1 minute 46 seconds, EGM consistent with an atrial tachycardia. No evidence of AF. Histogram distribution appropriate for patient and level of activity. No changes made this session. Device programmed at appropriate safety margins.   Assessment and Plan 1. Ischemic CM s/p ICD implant Normal device function No ventricular  arrhythmias/episodes No programming changes made Continue routine remote device checks every 3 months Return to clinic for follow-up with Dr. Rayann Heman in one year 2. Sinus node dysfunction [name] [name] is A paced 71% of time, 0% V paced Normal device function 3. Chronic systolic HF  Stable; euvolemic by exam Continue medical therapy and routine follow-up with Dr. [name] 4. CAD Stable without anginal symptoms Continue medical therapy and routine follow-up with Dr. [name] 5. PAF No evidence of AF on device interrogation today Per Dr. [name], not a good candidate for anticoagulation due to h/o bleeding Continue amiodarone and rate control  Signed, Anisia Leija, PA-C 03/22/2013, 9:58 AM

## 2013-04-17 ENCOUNTER — Encounter: Payer: Self-pay | Admitting: Internal Medicine

## 2013-06-24 ENCOUNTER — Encounter: Payer: Medicare PPO | Admitting: *Deleted

## 2013-07-02 ENCOUNTER — Encounter: Payer: Self-pay | Admitting: *Deleted

## 2013-07-15 ENCOUNTER — Encounter: Payer: Self-pay | Admitting: Cardiology

## 2013-07-17 ENCOUNTER — Other Ambulatory Visit: Payer: Self-pay

## 2013-07-17 DIAGNOSIS — R110 Nausea: Secondary | ICD-10-CM

## 2013-07-17 MED ORDER — DIGOXIN 125 MCG PO TABS: ORAL_TABLET | ORAL | Status: DC

## 2013-07-24 ENCOUNTER — Ambulatory Visit (INDEPENDENT_AMBULATORY_CARE_PROVIDER_SITE_OTHER): Payer: Medicare PPO | Admitting: Cardiology

## 2013-07-24 ENCOUNTER — Encounter: Payer: Self-pay | Admitting: Cardiology

## 2013-07-24 VITALS — BP 139/57 | HR 53 | Ht 71.0 in | Wt 173.1 lb

## 2013-07-24 DIAGNOSIS — I255 Ischemic cardiomyopathy: Secondary | ICD-10-CM

## 2013-07-24 DIAGNOSIS — I2589 Other forms of chronic ischemic heart disease: Secondary | ICD-10-CM

## 2013-07-24 DIAGNOSIS — I25810 Atherosclerosis of coronary artery bypass graft(s) without angina pectoris: Secondary | ICD-10-CM

## 2013-07-24 DIAGNOSIS — I5022 Chronic systolic (congestive) heart failure: Secondary | ICD-10-CM | POA: Insufficient documentation

## 2013-07-24 DIAGNOSIS — I2510 Atherosclerotic heart disease of native coronary artery without angina pectoris: Secondary | ICD-10-CM

## 2013-07-24 DIAGNOSIS — R110 Nausea: Secondary | ICD-10-CM

## 2013-07-24 DIAGNOSIS — I509 Heart failure, unspecified: Secondary | ICD-10-CM

## 2013-07-24 DIAGNOSIS — I4891 Unspecified atrial fibrillation: Secondary | ICD-10-CM

## 2013-07-24 HISTORY — DX: Chronic systolic (congestive) heart failure: I50.22

## 2013-07-24 MED ORDER — DIGOXIN 125 MCG PO TABS: ORAL_TABLET | ORAL | Status: DC

## 2013-07-24 MED ORDER — AMLODIPINE BESYLATE 5 MG PO TABS: 5.0000 mg | ORAL_TABLET | Freq: Every day | ORAL | Status: DC

## 2013-07-24 NOTE — Patient Instructions (Signed)
Reduce amiodarone dose to 200 mg alternating with 100 mg daily.  Continue your other therapy  I will see you in 6 months.  Send in you ICD check

## 2013-07-24 NOTE — Progress Notes (Signed)
[name] [name] Date of Birth: May 07, 1932 Medical Record [id number]  History of Present Illness: [name] [name] is seen back today for a followup visit. He has a history of coronary disease with remote inferior lateral myocardial infarction. He is status post CABG in 2002. He has an ischemic cardiomyopathy with ejection fraction of 35-40%. He denies any chest pain or SOB. No palpitations. ICD check in April showed no significant ventricular or atrial arrhythmias. No GI bleeding. Still working. His major complaint today is of a tremor in his hands which he has noticed more over the past one to 2 months. He also notes some decreased balance. He walks with a golf club to help steady himself. He is only taking Lasix 2-3 days a week.  Current Outpatient Prescriptions on File Prior to Visit  Medication Sig Dispense Refill  . amiodarone (PACERONE) 200 MG tablet Take 1 tablet (200 mg total) by mouth daily.  90 tablet  3  . cholecalciferol (VITAMIN D) 1000 UNITS tablet Take 1,000 Units by mouth daily.       . diphenoxylate-atropine (LOMOTIL) 2.5-0.025 MG per tablet Take 2 tablets by mouth as needed. For diarrhea       . furosemide (LASIX) 40 MG tablet Take 40 mg by mouth daily as needed.       . metoprolol (LOPRESSOR) 50 MG tablet TAKE 1 TABLET TWICE A DAY  180 tablet  0  . Multiple Vitamins-Minerals (MULTIVITAMINS THER. W/MINERALS) TABS Take 1 tablet by mouth daily.        . potassium chloride SA (K-DUR,KLOR-CON) 20 MEQ tablet Take 1 tablet (20 mEq total) by mouth daily.  30 tablet  6  . ramipril (ALTACE) 10 MG tablet Take 10 mg by mouth daily.        . rosuvastatin (CRESTOR) 5 MG tablet Take 5 mg by mouth 2 (two) times a week.      . psyllium (REGULOID) 0.52 G capsule Take 0.52 g by mouth as needed.        No current facility-administered medications on file prior to visit.    Allergies  Allergen Reactions  . Aspirin   . Iron   . Lescol [Fluvastatin Sodium]     Dizziness  . Pentazocine Lactate   .  Vytorin [Ezetimibe-Simvastatin]     Leg cramps     Past Medical History  Diagnosis Date  . Colon cancer     s/p chemo, RT, sugery  . Colorectal anastomotic stricture   . Cardiomyopathy     s/p ICD 09/30/08; EF 35 to 40% per echo in October 2021  . Iron deficiency anemia   . Diverticulosis   . Chronic systolic dysfunction of left ventricle   . ICD (implantable cardiac defibrillator) discharge October 2012    ICD shock for afib with RVR  . ED (erectile dysfunction)   . CAD (coronary artery disease)     Old inferolateral MI s/p CABG in 2002  . HTN (hypertension)   . Paroxysmal atrial fibrillation     controlled with amiodarone, not a coumadin candidate due to GI bleeding  . GERD (gastroesophageal reflux disease)     rarely  . CAP (community acquired pneumonia) 10/03/2011    Past Surgical History  Procedure Laterality Date  . Vasectomy    . Colon resection      x2  . Coronary artery bypass graft    . Cholecystectomy    . Back surgery    . Hernia repair    . Cardiac defibrillator  placement  09/30/08    by [name] [name]  . Cardiac catheterization  11/07/2000    EF 32%  . US echocardiography  08/11/2008    EF 30-35%  . Cardiovascular stress test  08/06/2008    EF 30%  . Appendectomy      History  Smoking status  . Current Some Day Smoker  . Types: Cigars  Smokeless tobacco  . Never Used    History  Alcohol Use No    Family History  Problem Relation Age of Onset  . Colon cancer Father   . Diabetes Sister   . Colon polyps Brother   . Pancreatic cancer Brother 14    Review of Systems: The review of systems is per the HPI.   No ICD shocks.  Does note his balance is a little  impaired.  All other systems were reviewed and are negative.  Physical Exam: BP 139/57  Pulse 53  Ht 5\' 11"  (1.803 m)  Wt 173 lb 1.9 oz (78.527 kg)  BMI 24.16 kg/m2 Patient is very pleasant and in no acute distress. Skin is warm and dry. Color is normal.  HEENT is unremarkable.  Normocephalic/atraumatic. PERRL. Sclera are nonicteric. Neck is supple. No masses. No JVD. Lungs are clear. Cardiac exam shows a regular rate and rhythm. Normal S1 and S2. [id number] apical systolic murmur. Abdomen is soft. Extremities are without edema. Gait and ROM are intact. No gross neurologic deficits noted.   LABORATORY DATA:    Assessment / Plan: 1. Atrial fibrillation. He is maintaining sinus rhythm on amiodarone.  Not a candidate for anticoagulation due to major GI bleed history. I think his current problems with tremor and gait imbalance are related to his amiodarone. I recommended reducing his dose to 200 mg alternating with 100 mg daily. We will follow his ICD and see if he has any significant breakthrough.  2. Ischemic cardiomyopathy status post ICD implant. He is well compensated today without significant edema and his weight is down. We will continue with Lasix on an as-needed basis.  3. Coronary disease with old inferiolateral myocardial infarction. Status post CABG in 2002. Currently asymptomatic. Continue metoprolol. He is not a candidate for antiplatelet or anticoagulant therapy because of recurrent GI bleeding.  4. Hypertension, controlled.

## 2013-07-25 ENCOUNTER — Other Ambulatory Visit: Payer: Self-pay | Admitting: Cardiology

## 2013-08-02 ENCOUNTER — Telehealth: Payer: Self-pay | Admitting: Cardiology

## 2013-08-02 NOTE — Telephone Encounter (Signed)
Pt needs  metorpolol  [facility] , needs  [facility] [address]  10-12 tabs to last until delivery from [facility]

## 2013-08-05 ENCOUNTER — Other Ambulatory Visit: Payer: Self-pay | Admitting: *Deleted

## 2013-08-05 MED ORDER — METOPROLOL TARTRATE 50 MG PO TABS: 50.0000 mg | ORAL_TABLET | Freq: Two times a day (BID) | ORAL | Status: DC

## 2013-08-05 MED ORDER — METOPROLOL TARTRATE 50 MG PO TABS: 25.0000 mg | ORAL_TABLET | Freq: Two times a day (BID) | ORAL | Status: DC

## 2013-10-14 ENCOUNTER — Emergency Department (HOSPITAL_COMMUNITY): Payer: Medicare PPO

## 2013-10-14 ENCOUNTER — Inpatient Hospital Stay (HOSPITAL_COMMUNITY)
Admission: EM | Admit: 2013-10-14 | Discharge: 2013-10-17 | DRG: 291 | Disposition: A | Discharge status: Home Health | Payer: Medicare PPO | Attending: Internal Medicine | Admitting: Internal Medicine

## 2013-10-14 ENCOUNTER — Inpatient Hospital Stay (HOSPITAL_COMMUNITY): Payer: Medicare PPO

## 2013-10-14 ENCOUNTER — Encounter (HOSPITAL_COMMUNITY): Payer: Self-pay | Admitting: Emergency Medicine

## 2013-10-14 DIAGNOSIS — I5021 Acute systolic (congestive) heart failure: Secondary | ICD-10-CM

## 2013-10-14 DIAGNOSIS — B370 Candidal stomatitis: Secondary | ICD-10-CM

## 2013-10-14 DIAGNOSIS — Z923 Personal history of irradiation: Secondary | ICD-10-CM

## 2013-10-14 DIAGNOSIS — I5022 Chronic systolic (congestive) heart failure: Secondary | ICD-10-CM

## 2013-10-14 DIAGNOSIS — R42 Dizziness and giddiness: Secondary | ICD-10-CM

## 2013-10-14 DIAGNOSIS — I509 Heart failure, unspecified: Secondary | ICD-10-CM

## 2013-10-14 DIAGNOSIS — I252 Old myocardial infarction: Secondary | ICD-10-CM

## 2013-10-14 DIAGNOSIS — IMO0002 Reserved for concepts with insufficient information to code with codable children: Secondary | ICD-10-CM

## 2013-10-14 DIAGNOSIS — I10 Essential (primary) hypertension: Secondary | ICD-10-CM | POA: Diagnosis present

## 2013-10-14 DIAGNOSIS — Z800 Family history of malignant neoplasm of digestive organs: Secondary | ICD-10-CM

## 2013-10-14 DIAGNOSIS — E876 Hypokalemia: Secondary | ICD-10-CM

## 2013-10-14 DIAGNOSIS — R0609 Other forms of dyspnea: Secondary | ICD-10-CM

## 2013-10-14 DIAGNOSIS — J209 Acute bronchitis, unspecified: Secondary | ICD-10-CM

## 2013-10-14 DIAGNOSIS — Z83719 Family history of colon polyps, unspecified: Secondary | ICD-10-CM

## 2013-10-14 DIAGNOSIS — F17200 Nicotine dependence, unspecified, uncomplicated: Secondary | ICD-10-CM | POA: Diagnosis present

## 2013-10-14 DIAGNOSIS — Z9852 Vasectomy status: Secondary | ICD-10-CM

## 2013-10-14 DIAGNOSIS — Z95810 Presence of automatic (implantable) cardiac defibrillator: Secondary | ICD-10-CM

## 2013-10-14 DIAGNOSIS — G4700 Insomnia, unspecified: Secondary | ICD-10-CM | POA: Diagnosis present

## 2013-10-14 DIAGNOSIS — Z79899 Other long term (current) drug therapy: Secondary | ICD-10-CM

## 2013-10-14 DIAGNOSIS — J9600 Acute respiratory failure, unspecified whether with hypoxia or hypercapnia: Secondary | ICD-10-CM | POA: Diagnosis present

## 2013-10-14 DIAGNOSIS — Z85038 Personal history of other malignant neoplasm of large intestine: Secondary | ICD-10-CM

## 2013-10-14 DIAGNOSIS — I5023 Acute on chronic systolic (congestive) heart failure: Principal | ICD-10-CM | POA: Diagnosis present

## 2013-10-14 DIAGNOSIS — R0902 Hypoxemia: Secondary | ICD-10-CM

## 2013-10-14 DIAGNOSIS — Z8371 Family history of colonic polyps: Secondary | ICD-10-CM

## 2013-10-14 DIAGNOSIS — I428 Other cardiomyopathies: Secondary | ICD-10-CM | POA: Diagnosis present

## 2013-10-14 DIAGNOSIS — Z833 Family history of diabetes mellitus: Secondary | ICD-10-CM

## 2013-10-14 DIAGNOSIS — T380X5A Adverse effect of glucocorticoids and synthetic analogues, initial encounter: Secondary | ICD-10-CM | POA: Diagnosis not present

## 2013-10-14 DIAGNOSIS — Z9221 Personal history of antineoplastic chemotherapy: Secondary | ICD-10-CM

## 2013-10-14 DIAGNOSIS — Z951 Presence of aortocoronary bypass graft: Secondary | ICD-10-CM

## 2013-10-14 DIAGNOSIS — Z886 Allergy status to analgesic agent status: Secondary | ICD-10-CM

## 2013-10-14 DIAGNOSIS — I4891 Unspecified atrial fibrillation: Secondary | ICD-10-CM | POA: Diagnosis present

## 2013-10-14 DIAGNOSIS — K219 Gastro-esophageal reflux disease without esophagitis: Secondary | ICD-10-CM | POA: Diagnosis present

## 2013-10-14 DIAGNOSIS — I2510 Atherosclerotic heart disease of native coronary artery without angina pectoris: Secondary | ICD-10-CM | POA: Diagnosis present

## 2013-10-14 DIAGNOSIS — R0600 Dyspnea, unspecified: Secondary | ICD-10-CM

## 2013-10-14 DIAGNOSIS — Z9089 Acquired absence of other organs: Secondary | ICD-10-CM

## 2013-10-14 DIAGNOSIS — Z888 Allergy status to other drugs, medicaments and biological substances status: Secondary | ICD-10-CM

## 2013-10-14 LAB — URINALYSIS W MICROSCOPIC + REFLEX CULTURE
Glucose, UA: NEGATIVE mg/dL
Ketones, ur: NEGATIVE mg/dL
Nitrite: NEGATIVE
Protein, ur: 30 mg/dL — AB
Specific Gravity, Urine: 1.017 (ref 1.005–1.030)
Urobilinogen, UA: 1 mg/dL (ref 0.0–1.0)

## 2013-10-14 LAB — CBC WITH DIFFERENTIAL/PLATELET
Basophils Relative: 0 % (ref 0–1)
Eosinophils Absolute: 0 10*3/uL (ref 0.0–0.7)
Eosinophils Relative: 0 % (ref 0–5)
HCT: 38.5 % — ABNORMAL LOW (ref 39.0–52.0)
Hemoglobin: 12.9 g/dL — ABNORMAL LOW (ref 13.0–17.0)
Lymphs Abs: 0.5 10*3/uL — ABNORMAL LOW (ref 0.7–4.0)
MCH: 31.5 pg (ref 26.0–34.0)
MCV: 93.9 fL (ref 78.0–100.0)
Monocytes Absolute: 0.5 10*3/uL (ref 0.1–1.0)
Monocytes Relative: 10 % (ref 3–12)
Platelets: 136 10*3/uL — ABNORMAL LOW (ref 150–400)
RBC: 4.1 MIL/uL — ABNORMAL LOW (ref 4.22–5.81)

## 2013-10-14 LAB — PRO B NATRIURETIC PEPTIDE: Pro B Natriuretic peptide (BNP): 3714 pg/mL — ABNORMAL HIGH (ref 0–450)

## 2013-10-14 LAB — TROPONIN I
Troponin I: <0.3 ng/mL (ref <0.30)
Troponin I: <0.3 ng/mL (ref <0.30)

## 2013-10-14 LAB — COMPREHENSIVE METABOLIC PANEL
BUN: 15 mg/dL (ref 6–23)
Calcium: 8.8 mg/dL (ref 8.4–10.5)
Creatinine, Ser: 1.05 mg/dL (ref 0.50–1.35)
GFR calc Af Amer: 76 mL/min — ABNORMAL LOW (ref >90)
GFR calc non Af Amer: 65 mL/min — ABNORMAL LOW (ref >90)
Glucose, Bld: 104 mg/dL — ABNORMAL HIGH (ref 70–99)
Sodium: 136 mEq/L (ref 135–145)
Total Protein: 6.5 g/dL (ref 6.0–8.3)

## 2013-10-14 LAB — DIGOXIN LEVEL: Digoxin Level: <0.3 ng/mL — ABNORMAL LOW (ref 0.8–2.0)

## 2013-10-14 MED ORDER — SODIUM CHLORIDE 0.9 % IV SOLN: 250.0000 mL | INTRAVENOUS | Status: DC | PRN

## 2013-10-14 MED ORDER — AMIODARONE HCL 100 MG PO TABS: 100.0000 mg | ORAL_TABLET | Freq: Every day | ORAL | Status: DC

## 2013-10-14 MED ORDER — SODIUM CHLORIDE 0.9 % IJ SOLN: 3.0000 mL | INTRAMUSCULAR | Status: DC | PRN

## 2013-10-14 MED ORDER — FLUCONAZOLE 100 MG PO TABS
100.0000 mg | ORAL_TABLET | Freq: Every day | ORAL | Status: DC
  Administered 2013-10-14 – 2013-10-17 (×4): 100 mg via ORAL
  Filled 2013-10-14 (×4): qty 1

## 2013-10-14 MED ORDER — POLYETHYLENE GLYCOL 3350 17 G PO PACK
17.0000 g | PACK | Freq: Every day | ORAL | Status: DC
  Administered 2013-10-14 – 2013-10-17 (×3): 17 g via ORAL
  Filled 2013-10-14 (×4): qty 1

## 2013-10-14 MED ORDER — ALBUTEROL SULFATE (5 MG/ML) 0.5% IN NEBU: 2.5000 mg | INHALATION_SOLUTION | Freq: Four times a day (QID) | RESPIRATORY_TRACT | Status: DC | PRN

## 2013-10-14 MED ORDER — ACETAMINOPHEN 325 MG PO TABS
650.0000 mg | ORAL_TABLET | Freq: Four times a day (QID) | ORAL | Status: DC | PRN
  Administered 2013-10-16: 650 mg via ORAL
  Filled 2013-10-14: qty 2

## 2013-10-14 MED ORDER — SODIUM CHLORIDE 0.9 % IJ SOLN
3.0000 mL | Freq: Two times a day (BID) | INTRAMUSCULAR | Status: DC
  Administered 2013-10-14 – 2013-10-15 (×2): 3 mL via INTRAVENOUS

## 2013-10-14 MED ORDER — ALBUTEROL SULFATE (5 MG/ML) 0.5% IN NEBU: 2.5000 mg | INHALATION_SOLUTION | RESPIRATORY_TRACT | Status: AC | PRN

## 2013-10-14 MED ORDER — LEVOFLOXACIN 500 MG PO TABS
500.0000 mg | ORAL_TABLET | Freq: Every day | ORAL | Status: DC
  Administered 2013-10-14: 500 mg via ORAL
  Filled 2013-10-14: qty 1

## 2013-10-14 MED ORDER — DM-GUAIFENESIN ER 30-600 MG PO TB12
1.0000 | ORAL_TABLET | Freq: Two times a day (BID) | ORAL | Status: DC | PRN
  Administered 2013-10-15 – 2013-10-16 (×2): 1 via ORAL
  Filled 2013-10-14 (×3): qty 1

## 2013-10-14 MED ORDER — ALBUTEROL SULFATE (5 MG/ML) 0.5% IN NEBU
2.5000 mg | INHALATION_SOLUTION | Freq: Four times a day (QID) | RESPIRATORY_TRACT | Status: DC
  Administered 2013-10-14 – 2013-10-15 (×3): 2.5 mg via RESPIRATORY_TRACT
  Filled 2013-10-14 (×3): qty 0.5

## 2013-10-14 MED ORDER — ENOXAPARIN SODIUM 40 MG/0.4ML ~~LOC~~ SOLN
40.0000 mg | SUBCUTANEOUS | Status: DC
  Administered 2013-10-14 – 2013-10-16 (×3): 40 mg via SUBCUTANEOUS
  Filled 2013-10-14 (×4): qty 0.4

## 2013-10-14 MED ORDER — ONDANSETRON HCL 4 MG/2ML IJ SOLN: 4.0000 mg | Freq: Four times a day (QID) | INTRAMUSCULAR | Status: DC | PRN

## 2013-10-14 MED ORDER — AMIODARONE HCL 200 MG PO TABS
200.0000 mg | ORAL_TABLET | ORAL | Status: DC
  Administered 2013-10-15 – 2013-10-17 (×2): 200 mg via ORAL
  Filled 2013-10-14 (×2): qty 1

## 2013-10-14 MED ORDER — METOPROLOL TARTRATE 50 MG PO TABS
50.0000 mg | ORAL_TABLET | Freq: Two times a day (BID) | ORAL | Status: DC
  Administered 2013-10-14 – 2013-10-17 (×6): 50 mg via ORAL
  Filled 2013-10-14 (×7): qty 1

## 2013-10-14 MED ORDER — SODIUM CHLORIDE 0.9 % IV SOLN: INTRAVENOUS | Status: DC

## 2013-10-14 MED ORDER — AMIODARONE HCL 100 MG PO TABS
100.0000 mg | ORAL_TABLET | ORAL | Status: DC
  Administered 2013-10-14 – 2013-10-16 (×2): 100 mg via ORAL
  Filled 2013-10-14 (×3): qty 1

## 2013-10-14 MED ORDER — ONDANSETRON HCL 4 MG PO TABS: 4.0000 mg | ORAL_TABLET | Freq: Four times a day (QID) | ORAL | Status: DC | PRN

## 2013-10-14 MED ORDER — FUROSEMIDE 10 MG/ML IJ SOLN
40.0000 mg | Freq: Every day | INTRAMUSCULAR | Status: DC
  Filled 2013-10-14: qty 4

## 2013-10-14 MED ORDER — IPRATROPIUM BROMIDE 0.02 % IN SOLN
0.5000 mg | Freq: Once | RESPIRATORY_TRACT | Status: AC
  Administered 2013-10-14: 0.5 mg via RESPIRATORY_TRACT
  Filled 2013-10-14: qty 2.5

## 2013-10-14 MED ORDER — DIGOXIN 0.0625 MG HALF TABLET
0.0625 mg | ORAL_TABLET | Freq: Every day | ORAL | Status: DC
  Administered 2013-10-14 – 2013-10-17 (×4): 0.0625 mg via ORAL
  Filled 2013-10-14 (×5): qty 1

## 2013-10-14 MED ORDER — ALBUTEROL SULFATE (5 MG/ML) 0.5% IN NEBU
5.0000 mg | INHALATION_SOLUTION | Freq: Once | RESPIRATORY_TRACT | Status: AC
  Administered 2013-10-14: 5 mg via RESPIRATORY_TRACT
  Filled 2013-10-14: qty 1

## 2013-10-14 MED ORDER — DEXTROSE 5 % IV SOLN
1.0000 g | INTRAVENOUS | Status: DC
  Administered 2013-10-14: 1 g via INTRAVENOUS
  Filled 2013-10-14 (×2): qty 10

## 2013-10-14 MED ORDER — FUROSEMIDE 10 MG/ML IJ SOLN
40.0000 mg | Freq: Once | INTRAMUSCULAR | Status: AC
  Administered 2013-10-14: 40 mg via INTRAVENOUS
  Filled 2013-10-14: qty 4

## 2013-10-14 MED ORDER — ACETAMINOPHEN 650 MG RE SUPP: 650.0000 mg | Freq: Four times a day (QID) | RECTAL | Status: DC | PRN

## 2013-10-14 MED ORDER — PANTOPRAZOLE SODIUM 40 MG PO TBEC
80.0000 mg | DELAYED_RELEASE_TABLET | Freq: Every day | ORAL | Status: DC
  Administered 2013-10-15 – 2013-10-17 (×3): 80 mg via ORAL
  Filled 2013-10-14 (×3): qty 2

## 2013-10-14 NOTE — ED Provider Notes (Signed)
CSN: [id number]     Arrival date & time 10/14/13  1104 History   First MD Initiated Contact with Patient 10/14/13 1216     Chief Complaint  Patient presents with  . Shortness of Breath    HPI Pt was seen at 1230. Per pt and his wife, c/o gradual onset and worsening of persistent SOB and cough for the past 4 days. Has been associated with intermittent home fevers to "101.6" and generalized weakness/fatigue. Pt states he "tripped and fell down some steps" 4 days ago and has been c/o "dizziness" since the fall. Denies syncope, no LOC, no AMS, no prodromal symptoms before fall. Denies CP/palpitations, no rash, no neck pain, no headache, no visual changes, no focal motor weakness, no tingling/numbness in extremities, no CP/palpitations, no abd pain, no N/V/D. Denies ICD discharge.   Past Medical History  Diagnosis Date  . Colon cancer     s/p chemo, RT, sugery  . Colorectal anastomotic stricture   . Cardiomyopathy     s/p ICD 09/30/08; EF 35 to 40% per echo in October 2021  . Iron deficiency anemia   . Diverticulosis   . ICD (implantable cardiac defibrillator) discharge October 2012    ICD shock for afib with RVR  . ED (erectile dysfunction)   . CAD (coronary artery disease)     Old inferolateral MI s/p CABG in 2002  . HTN (hypertension)   . Paroxysmal atrial fibrillation     controlled with amiodarone, not a coumadin candidate due to GI bleeding  . GERD (gastroesophageal reflux disease)     rarely  . CAP (community acquired pneumonia) 10/03/2011  . Chronic systolic CHF (congestive heart failure) 07/24/2013   Past Surgical History  Procedure Laterality Date  . Vasectomy    . Colon resection      x2  . Coronary artery bypass graft    . Cholecystectomy    . Back surgery    . Hernia repair    . Cardiac defibrillator placement  09/30/08    by Greggory Brandy  . Cardiac catheterization  11/07/2000    EF 32%  . US echocardiography  08/11/2008    EF 30-35%  . Cardiovascular stress test  08/06/2008     EF 30%  . Appendectomy     Family History  Problem Relation Age of Onset  . Colon cancer Father   . Diabetes Sister   . Colon polyps Brother   . Pancreatic cancer Brother 44   History  Substance Use Topics  . Smoking status: Current Some Day Smoker    Types: Cigars  . Smokeless tobacco: Never Used  . Alcohol Use: No    Review of Systems ROS: Statement: All systems negative except as marked or noted in the HPI; Constitutional: +fever and chills, generalized weakness/fatigue. ; ; Eyes: Negative for eye pain, redness and discharge. ; ; ENMT: Negative for ear pain, hoarseness, nasal congestion, sinus pressure and sore throat. ; ; Cardiovascular: Negative for chest pain, palpitations, diaphoresis, and peripheral edema. ; ; Respiratory: +SOB, cough. Negative for wheezing and stridor. ; ; Gastrointestinal: Negative for nausea, vomiting, diarrhea, abdominal pain, blood in stool, hematemesis, jaundice and rectal bleeding. . ; ; Genitourinary: Negative for dysuria, flank pain and hematuria. ; ; Musculoskeletal: Negative for back pain and neck pain. Negative for swelling and trauma.; ; Skin: Negative for pruritus, rash, abrasions, blisters, bruising and skin lesion.; ; Neuro: +"dizziness." Negative for headache, lightheadedness and neck stiffness. Negative for altered level of consciousness , altered  mental status, extremity weakness, paresthesias, involuntary movement, seizure and syncope.       Allergies  Aspirin; Iron; Lescol; Pentazocine lactate; and Vytorin  Home Medications   Current Outpatient Rx  Name  Route  Sig  Dispense  Refill  . amiodarone (PACERONE) 200 MG tablet   Oral   Take 100-200 mg by mouth daily. Take  200 mg alternating with 100 mg daily.         Marland Kitchen amLODipine (NORVASC) 5 MG tablet   Oral   Take 5 mg by mouth daily.         . cholecalciferol (VITAMIN D) 1000 UNITS tablet   Oral   Take 1,000 Units by mouth daily.          . digoxin (LANOXIN) 0.125 MG  tablet   Oral   Take 0.0625 mg by mouth daily. Take a half of a tablet daily         . diphenoxylate-atropine (LOMOTIL) 2.5-0.025 MG per tablet   Oral   Take 2 tablets by mouth as needed. For diarrhea          . esomeprazole (NEXIUM) 40 MG capsule   Oral   Take 40 mg by mouth daily at 12 noon.         . furosemide (LASIX) 40 MG tablet   Oral   Take 40 mg by mouth daily as needed for fluid.          . metoprolol (LOPRESSOR) 50 MG tablet   Oral   Take 1 tablet (50 mg total) by mouth 2 (two) times daily.   180 tablet   3   . Multiple Vitamins-Minerals (MULTIVITAMINS THER. W/MINERALS) TABS   Oral   Take 1 tablet by mouth daily.           . potassium chloride SA (K-DUR,KLOR-CON) 20 MEQ tablet   Oral   Take 1 tablet (20 mEq total) by mouth daily.   30 tablet   6   . psyllium (REGULOID) 0.52 G capsule   Oral   Take 0.52 g by mouth as needed.          . ramipril (ALTACE) 10 MG tablet   Oral   Take 10 mg by mouth daily.           . rosuvastatin (CRESTOR) 5 MG tablet   Oral   Take 5 mg by mouth 2 (two) times a week.          BP 125/77  Pulse 61  Temp(Src) 98.8 F (37.1 C) (Oral)  Resp 15  Wt 173 lb 8 oz (78.699 kg)  SpO2 97% Physical Exam 1235: Physical examination:  Nursing notes reviewed; Vital signs and O2 SAT reviewed;  Constitutional: Well developed, Well nourished, In no acute distress; Head:  Normocephalic, atraumatic; Eyes: EOMI, PERRL, No scleral icterus; ENMT: Mouth and pharynx normal, Mucous membranes dry; Neck: Supple, Full range of motion, No lymphadenopathy; Cardiovascular: Regular rate and rhythm, No gallop; Respiratory: Breath sounds coarse & equal bilaterally, No wheezes. Speaking full sentences, Normal respiratory effort/excursion; Chest: Nontender, Movement normal; Abdomen: Soft, Nontender, Nondistended, Normal bowel sounds; Genitourinary: No CVA tenderness; Extremities: Pulses normal, No tenderness, No edema, No calf edema or  asymmetry.; Neuro: AA&Ox3, vague historian. Major CN grossly intact. No facial droop. Speech clear. No gross focal motor or sensory deficits in extremities.; Skin: Color normal, Warm, Dry.   ED Course  Procedures    EKG Interpretation    Date/Time:  Monday October 14 2013  11:11:42 EST Ventricular Rate:  66 PR Interval:  168 QRS Duration: 130 QT Interval:  440 QTC Calculation: 461 R Axis:   -20 Text Interpretation:  Poor data quality Baseline wander Normal sinus rhythm Left axis deviation Non-specific intra-ventricular conduction block Nonspecific T wave abnormality            MDM  MDM Reviewed: vitals, nursing note and previous chart Reviewed previous: labs and ECG Interpretation: labs, ECG and x-ray      Results for orders placed during the hospital encounter of 10/14/13  PRO B NATRIURETIC PEPTIDE      Result Value Range   Pro B Natriuretic peptide (BNP) 3714.0 (*) 0 - 450 pg/mL  CBC WITH DIFFERENTIAL      Result Value Range   WBC 5.4  4.0 - 10.5 K/uL   RBC 4.10 (*) 4.22 - 5.81 MIL/uL   Hemoglobin 12.9 (*) 13.0 - 17.0 g/dL   HCT 38.5 (*) 39.0 - 52.0 %   MCV 93.9  78.0 - 100.0 fL   MCH 31.5  26.0 - 34.0 pg   MCHC 33.5  30.0 - 36.0 g/dL   RDW 14.2  11.5 - 15.5 %   Platelets 136 (*) 150 - 400 K/uL   Neutrophils Relative % 80 (*) 43 - 77 %   Neutro Abs 4.4  1.7 - 7.7 K/uL   Lymphocytes Relative 10 (*) 12 - 46 %   Lymphs Abs 0.5 (*) 0.7 - 4.0 K/uL   Monocytes Relative 10  3 - 12 %   Monocytes Absolute 0.5  0.1 - 1.0 K/uL   Eosinophils Relative 0  0 - 5 %   Eosinophils Absolute 0.0  0.0 - 0.7 K/uL   Basophils Relative 0  0 - 1 %   Basophils Absolute 0.0  0.0 - 0.1 K/uL  COMPREHENSIVE METABOLIC PANEL      Result Value Range   Sodium 136  135 - 145 mEq/L   Potassium 3.5  3.5 - 5.1 mEq/L   Chloride 99  96 - 112 mEq/L   CO2 27  19 - 32 mEq/L   Glucose, Bld 104 (*) 70 - 99 mg/dL   BUN 15  6 - 23 mg/dL   Creatinine, Ser 1.05  0.50 - 1.35 mg/dL   Calcium 8.8   8.4 - 10.5 mg/dL   Total Protein 6.5  6.0 - 8.3 g/dL   Albumin 3.2 (*) 3.5 - 5.2 g/dL   AST 30  0 - 37 U/L   ALT 18  0 - 53 U/L   Alkaline Phosphatase 41  39 - 117 U/L   Total Bilirubin 1.0  0.3 - 1.2 mg/dL   GFR calc non Af Amer 65 (*) >90 mL/min   GFR calc Af Amer 76 (*) >90 mL/min  TROPONIN I      Result Value Range   Troponin I <0.30  <0.30 ng/mL  URINALYSIS W MICROSCOPIC + REFLEX CULTURE      Result Value Range   Color, Urine YELLOW  YELLOW   APPearance CLEAR  CLEAR   Specific Gravity, Urine 1.017  1.005 - 1.030   pH 6.0  5.0 - 8.0   Glucose, UA NEGATIVE  NEGATIVE mg/dL   Hgb urine dipstick NEGATIVE  NEGATIVE   Bilirubin Urine NEGATIVE  NEGATIVE   Ketones, ur NEGATIVE  NEGATIVE mg/dL   Protein, ur 30 (*) NEGATIVE mg/dL   Urobilinogen, UA 1.0  0.0 - 1.0 mg/dL   Nitrite NEGATIVE  NEGATIVE  Leukocytes, UA NEGATIVE  NEGATIVE   WBC, UA 0-2  <3 WBC/hpf   RBC / HPF 0-2  <3 RBC/hpf   Bacteria, UA RARE  RARE   Squamous Epithelial / LPF RARE  RARE   Urine-Other MUCOUS PRESENT     Dg Chest 2 View 10/14/2013   CLINICAL DATA:  Shortness of breath, weakness.  EXAM: CHEST  2 VIEW  COMPARISON:  10/04/2011.  FINDINGS: Trachea is midline. Left subclavian pacemaker and ICD lead tips project over the right atrium and right ventricle. Airspace opacification in the right perihilar region appears unchanged from 10/04/2011. Question mild fibrotic changes at the lung bases. Blunting of the costophrenic angles appears chronic.  IMPRESSION: 1. No acute findings. 2. Patchy right perihilar opacification is unchanged from 10/04/2011, with probable mild coarsening of the pulmonary markings at the lung bases. If further evaluation for interstitial lung disease is desired, non emergent high-resolution chest CT without contrast could be performed.   Electronically Signed   By: Lorin Picket M.D.   On: 10/14/2013 12:31   Ct Head Wo Contrast 10/14/2013   CLINICAL DATA:  Intermittent dizziness. Not feeling  well since last Thursday. Several falls without injury.  EXAM: CT HEAD WITHOUT CONTRAST  TECHNIQUE: Contiguous axial images were obtained from the base of the skull through the vertex without intravenous contrast.  COMPARISON:  None.  FINDINGS: No skull fracture or intracranial hemorrhage.  Small vessel disease type changes without CT evidence of large acute infarct.  No hydrocephalus.  Mild global atrophy.  No intracranial mass lesion noted on this unenhanced exam.  Vascular calcifications.  IMPRESSION: No skull fracture or intracranial hemorrhage.  Small vessel disease type changes without CT evidence of large acute infarct.   Electronically Signed   By: Chauncey Cruel M.D.   On: 10/14/2013 14:27    1520:  Pt given neb on arrival for coarse lung sounds and Sats 91% R/A. After neb, lungs continue diminished but Sats increased to 96% R/A. Pt ambulated with Sats dropping to 87% R/A, increased tachypnea and WOB. Pt assisted back on stretcher with RR and Sats improving. Pt remains afebrile while in the ED; no clear cause for home fevers. IV lasix dosed for elevated BNP. Dx and testing d/w pt and family.  Questions answered.  Verb understanding, agreeable to admit.   T/C to [facility] Dr. Tyrell Antonio, case discussed, including:  HPI, pertinent PM/SHx, VS/PE, dx testing, ED course and treatment:  Agreeable to admit, requests to write temporary orders, obtain inpt tele bed to team 10.   Alfonzo Feller, DO 10/16/13 2037

## 2013-10-14 NOTE — ED Notes (Signed)
Called RN on [phone]. RN stated she would call back to receive report

## 2013-10-14 NOTE — ED Notes (Signed)
Pt states that he started having some shortness of breath and dizziness.  Pt has ICD.  Wife reports cough with temp of 100.4 and 101.6 yesterday

## 2013-10-14 NOTE — ED Notes (Signed)
Called [phone] to give report. Nurse stated she would call back.

## 2013-10-14 NOTE — H&P (Signed)
[facility] History and Physical  [name] [name] [id number] DOB: January 29, 1932 DOA: 10/14/2013  Referring physician: Dr Thurnell Garbe.  PCP: Jerlyn Ly, MD  Specialists: none  Chief Complaint: SOB, cough, dizziness.   HPI: [name] [name] is a 79 y.o. male with PMH significant for CAD s/p CABG 123XX123, systolic CHF EF 35 to 40 % by ECHO 2012, S/P ICD, history of A fib no candidate for anticoagulation due to history of GI bleed who presents to the ED complaining  Of cough, dyspnea at rest and on exertion that started 5 days prior to admission. He also report fevers, tempeture at 101.  Dyspnea at times is worse during coughing spell. He report productive cough specially in the morning. Later during the day is a dry cough.  He denies increase weight, or swelling, no chest pain, or wheezes. He denies nausea, vomiting. He continue to smoke one cigar per day.   He also report dizziness, lightheaded specially during standing. No headaches. Of note patient fell 5 days prior to admission. He fell backward. No loss of consciousness. He denies any pain.  He has not been taking lasix for last week. He only takes lasix as needed.  Patient oxygen saturation drop to 87 on ambulation in the ED.   Review of Systems: negative except as per HPI.   Past Medical History  Diagnosis Date  . Colon cancer     s/p chemo, RT, sugery  . Colorectal anastomotic stricture   . Cardiomyopathy     s/p ICD 09/30/08; EF 35 to 40% per echo in October 2021  . Iron deficiency anemia   . Diverticulosis   . ICD (implantable cardiac defibrillator) discharge October 2012    ICD shock for afib with RVR  . ED (erectile dysfunction)   . CAD (coronary artery disease)     Old inferolateral MI s/p CABG in 2002  . HTN (hypertension)   . Paroxysmal atrial fibrillation     controlled with amiodarone, not a coumadin candidate due to GI bleeding  . GERD (gastroesophageal reflux disease)     rarely  . CAP (community acquired  pneumonia) 10/03/2011  . Chronic systolic CHF (congestive heart failure) 07/24/2013   Past Surgical History  Procedure Laterality Date  . Vasectomy    . Colon resection      x2  . Coronary artery bypass graft    . Cholecystectomy    . Back surgery    . Hernia repair    . Cardiac defibrillator placement  09/30/08    by Greggory Brandy  . Cardiac catheterization  11/07/2000    EF 32%  . US echocardiography  08/11/2008    EF 30-35%  . Cardiovascular stress test  08/06/2008    EF 30%  . Appendectomy     Social History:  reports that he has been smoking Cigars.  He has never used smokeless tobacco. He reports that he does not drink alcohol or use illicit drugs. he is retired.    Allergies  Allergen Reactions  . Aspirin   . Iron   . Lescol [Fluvastatin Sodium]     Dizziness  . Pentazocine Lactate   . Vytorin [Ezetimibe-Simvastatin]     Leg cramps     Family History  Problem Relation Age of Onset  . Colon cancer Father   . Diabetes Sister   . Colon polyps Brother   . Pancreatic cancer Brother 3    Prior to Admission medications   Medication Sig Start Date End Date Taking?  Authorizing Provider  amiodarone (PACERONE) 200 MG tablet Take 100-200 mg by mouth daily. Take  200 mg alternating with 100 mg daily. 07/25/13  Yes Peter M [name], MD  amLODipine (NORVASC) 5 MG tablet Take 5 mg by mouth daily. 07/24/13  Yes Peter M [name], MD  cholecalciferol (VITAMIN D) 1000 UNITS tablet Take 1,000 Units by mouth daily.    Yes Historical Provider, MD  digoxin (LANOXIN) 0.125 MG tablet Take 0.0625 mg by mouth daily. Take a half of a tablet daily 07/24/13  Yes Peter M [name], MD  diphenoxylate-atropine (LOMOTIL) 2.5-0.025 MG per tablet Take 2 tablets by mouth as needed. For diarrhea    Yes Historical Provider, MD  esomeprazole (NEXIUM) 40 MG capsule Take 40 mg by mouth daily at 12 noon.   Yes Historical Provider, MD  furosemide (LASIX) 40 MG tablet Take 40 mg by mouth daily as needed for fluid.    Yes  Historical Provider, MD  metoprolol (LOPRESSOR) 50 MG tablet Take 1 tablet (50 mg total) by mouth 2 (two) times daily. 08/16/13  Yes Peter M [name], MD  Multiple Vitamins-Minerals (MULTIVITAMINS THER. W/MINERALS) TABS Take 1 tablet by mouth daily.     Yes Historical Provider, MD  potassium chloride SA (K-DUR,KLOR-CON) 20 MEQ tablet Take 1 tablet (20 mEq total) by mouth daily. 10/06/11  Yes Rogelia Mire, NP  psyllium (REGULOID) 0.52 G capsule Take 0.52 g by mouth as needed.    Yes Historical Provider, MD  ramipril (ALTACE) 10 MG tablet Take 10 mg by mouth daily.     Yes Historical Provider, MD  rosuvastatin (CRESTOR) 5 MG tablet Take 5 mg by mouth 2 (two) times a week.   Yes Historical Provider, MD   Physical Exam: Filed Vitals:   10/14/13 1430  BP: 137/50  Pulse: 59  Temp:   Resp: 34   General Appearance:    Alert, cooperative, no distress, appears stated age  Head:    Normocephalic, without obvious abnormality, atraumatic  Eyes:    PERRL, conjunctiva/corneas clear, EOM's intact,         Ears:    Normal TM's and external ear canals, both ears  Nose:   Nares normal, septum midline, mucosa normal, no drainage    or sinus tenderness  Throat:   Lips, mucosa, and tongue normal; white plaque, soft palate, uvula area.  Neck:   Supple, symmetrical, trachea midline, no adenopathy;       thyroid:  No enlargement/tenderness/nodules; no carotid   Bruit. Positive JVD.      Lungs:     Bilateral ronchus and crackles, respirations unlabored     Heart:    Regular rate and rhythm, S1 and S2 normal, no murmur, rub   or gallop  Abdomen:     Soft, non-tender, bowel sounds active all four quadrants,    no masses, no organomegaly  Genitalia:    Deferred.   Rectal:    Deferred.   Extremities:   Extremities normal, atraumatic, no cyanosis or edema  Pulses:   2+ and symmetric all extremities  Skin:   Skin color, texture, turgor normal, no rashes or lesions  Lymph nodes:   Cervical, supraclavicular,  and axillary nodes normal  Neurologic:   CNII-XII intact. Normal strength, sensation and reflexes      throughout      Labs on Admission:  Basic Metabolic Panel:  Recent Labs Lab 10/14/13 1239  NA 136  K 3.5  CL 99  CO2 27  GLUCOSE 104*  BUN 15  CREATININE 1.05  CALCIUM 8.8   Liver Function Tests:  Recent Labs Lab 10/14/13 1239  AST 30  ALT 18  ALKPHOS 41  BILITOT 1.0  PROT 6.5  ALBUMIN 3.2*   No results found for this basename: LIPASE, AMYLASE,  in the last 168 hours No results found for this basename: AMMONIA,  in the last 168 hours CBC:  Recent Labs Lab 10/14/13 1239  WBC 5.4  NEUTROABS 4.4  HGB 12.9*  HCT 38.5*  MCV 93.9  PLT 136*   Cardiac Enzymes:  Recent Labs Lab 10/14/13 1239  TROPONINI <0.30    BNP (last 3 results)  Recent Labs  10/14/13 1239  PROBNP 3714.0*   CBG: No results found for this basename: GLUCAP,  in the last 168 hours  Radiological Exams on Admission: Dg Chest 2 View  10/14/2013   CLINICAL DATA:  Shortness of breath, weakness.  EXAM: CHEST  2 VIEW  COMPARISON:  10/04/2011.  FINDINGS: Trachea is midline. Left subclavian pacemaker and ICD lead tips project over the right atrium and right ventricle. Airspace opacification in the right perihilar region appears unchanged from 10/04/2011. Question mild fibrotic changes at the lung bases. Blunting of the costophrenic angles appears chronic.  IMPRESSION: 1. No acute findings. 2. Patchy right perihilar opacification is unchanged from 10/04/2011, with probable mild coarsening of the pulmonary markings at the lung bases. If further evaluation for interstitial lung disease is desired, non emergent high-resolution chest CT without contrast could be performed.   Electronically Signed   By: Lorin Picket M.D.   On: 10/14/2013 12:31   Ct Head Wo Contrast  10/14/2013   CLINICAL DATA:  Intermittent dizziness. Not feeling well since last Thursday. Several falls without injury.  EXAM: CT  HEAD WITHOUT CONTRAST  TECHNIQUE: Contiguous axial images were obtained from the base of the skull through the vertex without intravenous contrast.  COMPARISON:  None.  FINDINGS: No skull fracture or intracranial hemorrhage.  Small vessel disease type changes without CT evidence of large acute infarct.  No hydrocephalus.  Mild global atrophy.  No intracranial mass lesion noted on this unenhanced exam.  Vascular calcifications.  IMPRESSION: No skull fracture or intracranial hemorrhage.  Small vessel disease type changes without CT evidence of large acute infarct.   Electronically Signed   By: Chauncey Cruel M.D.   On: 10/14/2013 14:27    EKG: Independently reviewed. Normal sinus rhythm.   Assessment/Plan Active Problems:   Atrial fibrillation   CAD (coronary artery disease)   Bronchitis, acute   Oral pharyngeal candidiasis   Dizziness  1-Acute on chronic Systolic HF: Patient presents with dyspnea, cough, positive JVD. Increase BNP at 3714, chest x ray without florid heart failure. I will continue with IV lasix 40 mg daily. Will cycle cardiac enzymes. Daily weight.   2-Cough/ Dyspnea. This could be also secondary to acute bronchitis. He report fevers. I will start oral Levaquin. Nebulizer treatments.   3-Right peri-hilar opacification on Chest x ray; will order CT chest due to current presentation.   4-Dizziness: check orthostatic. Will hold Norvasc and lisinopril. Need PT evaluation. CT head negative. Check digoxin level.   5-A fib: continue with amiodarone, and digoxin. Check digoxin level.  6-oral pharyngeal candidiasis: Fluconazole. Allergic to nystatin mixer.   Code Status: presume full code.  Family Communication: Care discussed with wife who was at bedside.  Disposition Plan: expect 2 to 3 days inpatient.   Time spent: 70  minutes.   Izell Labat [facility]  Pager [phone]  If 7PM-7AM, please contact night-coverage [email or web address] Password [email or web address] 10/14/2013, 4:14  PM

## 2013-10-14 NOTE — ED Notes (Signed)
Admit Doctor at bedside.  

## 2013-10-14 NOTE — Progress Notes (Signed)
Pt arrived to floor from the ED via stretcher orented to room.      Wife at bedside.  Instructed pt and wife to call for assist.  Verbalized understanding.  Dr. Tyrell Antonio made informed pt is here.  MD instructed that she had already seen pt.  Orders noted to be in chart.  Will continue to monitor.  Karie Kirks, Therapist, sports.

## 2013-10-15 ENCOUNTER — Encounter (HOSPITAL_COMMUNITY): Payer: Self-pay | Admitting: General Practice

## 2013-10-15 DIAGNOSIS — B370 Candidal stomatitis: Secondary | ICD-10-CM

## 2013-10-15 DIAGNOSIS — J9600 Acute respiratory failure, unspecified whether with hypoxia or hypercapnia: Secondary | ICD-10-CM | POA: Diagnosis present

## 2013-10-15 DIAGNOSIS — I5023 Acute on chronic systolic (congestive) heart failure: Secondary | ICD-10-CM

## 2013-10-15 DIAGNOSIS — I5021 Acute systolic (congestive) heart failure: Secondary | ICD-10-CM

## 2013-10-15 LAB — CBC
HCT: 37 % — ABNORMAL LOW (ref 39.0–52.0)
Hemoglobin: 12.4 g/dL — ABNORMAL LOW (ref 13.0–17.0)
MCH: 31.2 pg (ref 26.0–34.0)
MCHC: 33.5 g/dL (ref 30.0–36.0)
MCV: 93.2 fL (ref 78.0–100.0)
RBC: 3.97 MIL/uL — ABNORMAL LOW (ref 4.22–5.81)

## 2013-10-15 LAB — BASIC METABOLIC PANEL
BUN: 14 mg/dL (ref 6–23)
Chloride: 98 mEq/L (ref 96–112)
GFR calc Af Amer: 78 mL/min — ABNORMAL LOW (ref >90)
GFR calc non Af Amer: 67 mL/min — ABNORMAL LOW (ref >90)
Glucose, Bld: 96 mg/dL (ref 70–99)
Potassium: 3.3 mEq/L — ABNORMAL LOW (ref 3.5–5.1)
Sodium: 138 mEq/L (ref 135–145)

## 2013-10-15 LAB — TROPONIN I: Troponin I: <0.3 ng/mL (ref <0.30)

## 2013-10-15 MED ORDER — ZOLPIDEM TARTRATE 5 MG PO TABS
5.0000 mg | ORAL_TABLET | Freq: Every evening | ORAL | Status: DC | PRN
  Administered 2013-10-16: 22:00:00 5 mg via ORAL
  Filled 2013-10-15: qty 1

## 2013-10-15 MED ORDER — ALBUTEROL SULFATE (5 MG/ML) 0.5% IN NEBU
2.5000 mg | INHALATION_SOLUTION | RESPIRATORY_TRACT | Status: DC | PRN
  Filled 2013-10-15: qty 0.5

## 2013-10-15 MED ORDER — POTASSIUM CHLORIDE CRYS ER 20 MEQ PO TBCR
40.0000 meq | EXTENDED_RELEASE_TABLET | Freq: Two times a day (BID) | ORAL | Status: AC
  Administered 2013-10-15 (×2): 40 meq via ORAL
  Filled 2013-10-15 (×2): qty 2

## 2013-10-15 MED ORDER — AZITHROMYCIN 500 MG PO TABS
500.0000 mg | ORAL_TABLET | Freq: Every day | ORAL | Status: DC
  Administered 2013-10-15 – 2013-10-17 (×3): 500 mg via ORAL
  Filled 2013-10-15 (×3): qty 1

## 2013-10-15 MED ORDER — FUROSEMIDE 10 MG/ML IJ SOLN: 20.0000 mg | Freq: Two times a day (BID) | INTRAMUSCULAR | Status: DC

## 2013-10-15 MED ORDER — PREDNISONE 20 MG PO TABS
40.0000 mg | ORAL_TABLET | Freq: Every day | ORAL | Status: DC
  Administered 2013-10-16 – 2013-10-17 (×2): 40 mg via ORAL
  Filled 2013-10-15 (×3): qty 2

## 2013-10-15 MED ORDER — FUROSEMIDE 10 MG/ML IJ SOLN
20.0000 mg | Freq: Two times a day (BID) | INTRAMUSCULAR | Status: DC
  Administered 2013-10-15 – 2013-10-17 (×5): 20 mg via INTRAVENOUS
  Filled 2013-10-15 (×6): qty 2

## 2013-10-15 MED ORDER — ALBUTEROL SULFATE (5 MG/ML) 0.5% IN NEBU
2.5000 mg | INHALATION_SOLUTION | Freq: Two times a day (BID) | RESPIRATORY_TRACT | Status: DC
  Administered 2013-10-15 – 2013-10-16 (×2): 2.5 mg via RESPIRATORY_TRACT
  Filled 2013-10-15 (×2): qty 0.5

## 2013-10-15 NOTE — Progress Notes (Signed)
Patient evaluated for community based chronic disease management services with [facility] as a benefit of patient's [employer]. Spoke with patient at bedside to explain [facility] services.  Patient will receive a post discharge transition of care call from [facility].  He and his wife currently have a Chiropractor that makes home visits to monitor their medication management.  [facility] will not engage at this time.  Left contact information and [facility] literature at bedside. Made Inpatient Case Manager aware that [facility] following. Of note, [facility] Care Management services does not replace or interfere with any services that are arranged by inpatient case management or social work.  For additional questions or referrals please contact Corliss Blacker BSN RN [facility] Liaison at [phone].

## 2013-10-15 NOTE — Progress Notes (Signed)
[facility] PROGRESS NOTE Interim History: 79 y.o. male with PMH significant for CAD s/p CABG 123XX123, systolic CHF EF 35 to 40 % by ECHO 2012, S/P ICD, history of A fib no candidate for anticoagulation due to history of GI bleed who presents to the ED complaining Of cough, dyspnea at rest and on exertion that started 5 days prior to admission. He also report fevers, tempeture at 101    Assessment/Plan: Respiratory failure, acute multifactorial/Acute systolic congestive heart failure/  Bronchitis, acute - IV steroids change to orals., Inhalers and antibiotics. - Cont IV lasix has + JVD, stricit I and O's, daily weights. - monitor electrolytes. Cardiac markers negative. - estimated dry weight 154 lb.  Oral pharyngeal candidiasis: - diflucan. - no dysphagia.   Atrial fibrillation?HTN: - rate controlled. - help lisinopril and norvasc. - orthostatic negative.  CAD (coronary artery disease) - ASA  Code Status: full Family Communication: wife  Disposition Plan: inpatient   Consultants:  none  Procedures:  CXR  Antibiotics:  azithro  HPI/Subjective: Dizziness resolved. Breathing better  Objective: Filed Vitals:   10/15/13 0524 10/15/13 0731 10/15/13 0916 10/15/13 0917  BP: 128/51  123/50   Pulse: 62 63 76 76  Temp: 98.1 F (36.7 C)     TempSrc: Oral     Resp: 18 18    Height:      Weight: 74.98 kg (165 lb 4.8 oz)     SpO2: 94% 96%      Intake/Output Summary (Last 24 hours) at 10/15/13 0937 Last data filed at 10/15/13 0106  Gross per 24 hour  Intake    530 ml  Output    650 ml  Net   -120 ml   Filed Weights   10/14/13 1122 10/14/13 1741 10/15/13 0524  Weight: 78.699 kg (173 lb 8 oz) 75.4 kg (166 lb 3.6 oz) 74.98 kg (165 lb 4.8 oz)    Exam:  General: Alert, awake, oriented x3, in no acute distress.  HEENT: No bruits, no goiter. +JVD Heart: Regular rate and rhythm, without murmurs, rubs, gallops.  Lungs: Good air movement, clear to  auscultation Abdomen: Soft, nontender, nondistended, positive bowel sounds.     Data Reviewed: Basic Metabolic Panel:  Recent Labs Lab 10/14/13 1239 10/15/13 0515  NA 136 138  K 3.5 3.3*  CL 99 98  CO2 27 29  GLUCOSE 104* 96  BUN 15 14  CREATININE 1.05 1.03  CALCIUM 8.8 8.4   Liver Function Tests:  Recent Labs Lab 10/14/13 1239  AST 30  ALT 18  ALKPHOS 41  BILITOT 1.0  PROT 6.5  ALBUMIN 3.2*   No results found for this basename: LIPASE, AMYLASE,  in the last 168 hours No results found for this basename: AMMONIA,  in the last 168 hours CBC:  Recent Labs Lab 10/14/13 1239 10/15/13 0515  WBC 5.4 7.4  NEUTROABS 4.4  --   HGB 12.9* 12.4*  HCT 38.5* 37.0*  MCV 93.9 93.2  PLT 136* 136*   Cardiac Enzymes:  Recent Labs Lab 10/14/13 1239 10/14/13 1850 10/14/13 2200 10/15/13 0515  TROPONINI <0.30 <0.30 <0.30 <0.30   BNP (last 3 results)  Recent Labs  10/14/13 1239  PROBNP 3714.0*   CBG: No results found for this basename: GLUCAP,  in the last 168 hours  No results found for this or any previous visit (from the past 240 hour(s)).   Studies: Dg Chest 2 View  10/14/2013   CLINICAL DATA:  Shortness of breath, weakness.  EXAM: CHEST  2 VIEW  COMPARISON:  10/04/2011.  FINDINGS: Trachea is midline. Left subclavian pacemaker and ICD lead tips project over the right atrium and right ventricle. Airspace opacification in the right perihilar region appears unchanged from 10/04/2011. Question mild fibrotic changes at the lung bases. Blunting of the costophrenic angles appears chronic.  IMPRESSION: 1. No acute findings. 2. Patchy right perihilar opacification is unchanged from 10/04/2011, with probable mild coarsening of the pulmonary markings at the lung bases. If further evaluation for interstitial lung disease is desired, non emergent high-resolution chest CT without contrast could be performed.   Electronically Signed   By: Lorin Picket M.D.   On: 10/14/2013  12:31   Ct Head Wo Contrast  10/14/2013   CLINICAL DATA:  Intermittent dizziness. Not feeling well since last Thursday. Several falls without injury.  EXAM: CT HEAD WITHOUT CONTRAST  TECHNIQUE: Contiguous axial images were obtained from the base of the skull through the vertex without intravenous contrast.  COMPARISON:  None.  FINDINGS: No skull fracture or intracranial hemorrhage.  Small vessel disease type changes without CT evidence of large acute infarct.  No hydrocephalus.  Mild global atrophy.  No intracranial mass lesion noted on this unenhanced exam.  Vascular calcifications.  IMPRESSION: No skull fracture or intracranial hemorrhage.  Small vessel disease type changes without CT evidence of large acute infarct.   Electronically Signed   By: Chauncey Cruel M.D.   On: 10/14/2013 14:27   Ct Chest Wo Contrast  10/14/2013   CLINICAL DATA:  Abnormal chest x-ray.  EXAM: CT CHEST WITHOUT CONTRAST  TECHNIQUE: Multidetector CT imaging of the chest was performed following the standard protocol without IV contrast.  COMPARISON:  Multiple prior chest x-rays.  FINDINGS: The chest wall is unremarkable. A permanent left-sided pacemaker is noted. No breast masses, supraclavicular or axillary adenopathy. The bony thorax is intact. No destructive bone lesions or spinal canal compromise. Surgical changes from bypass surgery are noted.  The heart is normal in size. No pericardial effusion. Scattered mediastinal and hilar lymph nodes throughout. The esophagus is grossly normal. The aorta demonstrates tortuosity, ectasia and calcification. Dense coronary artery calcifications are noted.  Examination of the lung parenchyma demonstrates patchy bilateral ground-glass opacity, right greater than left. Findings could be secondary to asymmetric pulmonary edema or other partial airspace filling process such as infection alveolitis. No worrisome pulmonary masses. There are small bilateral pleural effusions with overlying bibasilar  atelectasis. The tracheobronchial tree is grossly normal.  The upper abdomen is unremarkable.  IMPRESSION: Diffuse but patchy and asymmetric airspace process with ground-glass opacities throughout. Findings could be secondary to asymmetric pulmonary edema or other partial airspace filling process such as infection, hemorrhage or alveolitis.  Small bilateral pleural effusions.  Scattered borderline mediastinal and hilar lymph nodes.   Electronically Signed   By: Kalman Jewels M.D.   On: 10/14/2013 17:19    Scheduled Meds: . albuterol  2.5 mg Nebulization Q6H  . amiodarone  100 mg Oral QODAY  . amiodarone  200 mg Oral QODAY  . azithromycin  500 mg Oral Daily  . digoxin  0.0625 mg Oral Daily  . enoxaparin (LOVENOX) injection  40 mg Subcutaneous Q24H  . fluconazole  100 mg Oral Daily  . furosemide  20 mg Intravenous BID  . metoprolol  50 mg Oral BID  . pantoprazole  80 mg Oral Q1200  . polyethylene glycol  17 g Oral Daily  . potassium chloride  40 mEq Oral BID  . [START  ON 10/16/2013] predniSONE  40 mg Oral Q breakfast   Continuous Infusions:    Charlynne Cousins  [facility] Pager [phone]. If 8PM-8AM, please contact night-coverage at [email or web address], password [facility] 10/15/2013, 9:37 AM  LOS: 1 day

## 2013-10-15 NOTE — Progress Notes (Signed)
Occupational Therapy Evaluation Patient Details Name: [name] [name] MRN: [id number] DOB: 04-13-1932 Today's Date: 10/15/2013 Time: [id number] OT Time Calculation (min): 15 min  OT Assessment / Plan / Recommendation History of present illness Pt admit with CHF and afib.   Clinical Impression   Patient with decreased balance and safety awareness.    OT Assessment  Patient needs continued OT Services    Follow Up Recommendations  Supervision/Assistance - 24 hour    Barriers to Discharge      Equipment Recommendations  3 in 1 bedside comode    Recommendations for Other Services    Frequency  Min 2X/week    Precautions / Restrictions Precautions Precautions: Fall Restrictions Weight Bearing Restrictions: No   Pertinent Vitals/Pain No c/o pain    ADL  Grooming: Performed;Min guard Where Assessed - Grooming: Supported standing Lower Body Bathing: Simulated;Minimal assistance Where Assessed - Lower Body Bathing: Supported sit to stand Lower Body Dressing: Performed;Minimal assistance Where Assessed - Lower Body Dressing: Supported sit to Lobbyist: Performed;Minimal Print production planner Method: Sit to stand;Stand pivot Science writer: Bedside commode Transfers/Ambulation Related to ADLs: Bed mobility S level. Sit to stand and stand to sit min guard A. Amb in room with O2 min guard A. ADL Comments: Began education on energy conservation techniques during ADLs.     OT Diagnosis: Generalized weakness  OT Problem List: Decreased activity tolerance;Impaired balance (sitting and/or standing);Decreased safety awareness;Decreased knowledge of use of DME or AE OT Treatment Interventions: Self-care/ADL training;Energy conservation;DME and/or AE instruction;Therapeutic activities;Patient/family education   OT Goals(Current goals can be found in the care plan section) Acute Rehab OT Goals Patient Stated Goal: to be independent OT Goal Formulation: With  patient Time For Goal Achievement: 10/29/13 Potential to Achieve Goals: Good  Visit Information  Last OT Received On: 10/15/13 Assistance Needed: +1 History of Present Illness: Pt admit with CHF and afib.       Prior [place] expects to be discharged to:: Private residence Living Arrangements: Spouse/significant other Available Help at Discharge: Family;Available 24 hours/day Type of Home: Apartment Home Access: Level entry Home Layout: One level Prior Function Level of Independence: Independent Comments: reports he has difficulty getting off of low toilet Communication Communication: No difficulties Dominant Hand: Right         Vision/Perception Vision - History Baseline Vision: Wears glasses all the time Patient Visual Report: No change from baseline   Cognition  Cognition Arousal/Alertness: Awake/alert Behavior During Therapy: WFL for tasks assessed/performed Overall Cognitive Status: Within Functional Limits for tasks assessed    Extremity/Trunk Assessment Upper Extremity Assessment Upper Extremity Assessment: Overall WFL for tasks assessed;Generalized weakness Cervical / Trunk Assessment Cervical / Trunk Assessment: Kyphotic     Mobility       Exercise     Balance     End of Session OT - End of Session Equipment Utilized During Treatment: Gait belt;Oxygen Activity Tolerance: Patient tolerated treatment well Patient left: in bed;with call bell/phone within reach  GO     [name] [name] A 10/15/2013, 2:57 PM

## 2013-10-15 NOTE — Evaluation (Signed)
Physical Therapy Evaluation Patient Details Name: [name] [name] MRN: [id number] DOB: 01/22/1932 Today's Date: 10/15/2013 Time: [id number] PT Time Calculation (min): 44 min  PT Assessment / Plan / Recommendation History of Present Illness  Pt admit with CHF and afib.  Clinical Impression  Pt admitted with above. Pt currently with functional limitations due to the deficits listed below (see PT Problem List). Spoke at length with pt, wife and son about HHPT needs, RW and 3N1.  Also gave handout re: CHF exercises as well as walking recommendations.  Pt and family verbalize understanding. Wife to provide 24 hour assist.   Pt will benefit from skilled PT to increase their independence and safety with mobility to allow discharge to the venue listed below.     PT Assessment  Patient needs continued PT services    Follow Up Recommendations  Home health PT;Supervision/Assistance - 24 hour                Equipment Recommendations  Rolling walker with 5" wheels;3in1 (PT)         Frequency Min 3X/week    Precautions / Restrictions Precautions Precautions: Fall Restrictions Weight Bearing Restrictions: No   Pertinent Vitals/Pain O2 sat 94% on 2L; 87-88% on RA at rest.  REplaced O2. No pain Orthostatic BPs  Supine 126/51, 73 bpm  Sitting 117/50, 79 bpm  Standing 120/53, 81 bpm  Standing after 3 min 135/53, 75 bpm       Mobility  Bed Mobility Bed Mobility: Rolling Right;Right Sidelying to Sit;Sitting - Scoot to Edge of Bed Rolling Right: 4: Min assist Right Sidelying to Sit: 4: Min assist Sitting - Scoot to Edge of Bed: 4: Min assist Details for Bed Mobility Assistance: Assist and cues for LEs and for elevation of trunk. Transfers Transfers: Sit to Stand;Stand to Sit Sit to Stand: 4: Min assist;With upper extremity assist;From bed Stand to Sit: 4: Min assist;With upper extremity assist;To chair/3-in-1;With armrests Details for Transfer Assistance: cues for hand placement.  Pt  needed steadying assist.  Pt min asssit to sit to stand to sit on toilet and cleaned self independently.   Ambulation/Gait Ambulation/Gait Assistance: 4: Min assist Ambulation Distance (Feet): 100 Feet Assistive device: 1 person hand held assist;Rolling walker Ambulation/Gait Assistance Details: Pt ambulated with HHA on left with unsteady gait overall with pt losing balance left several times if not steadied.  Pt performed much better when using RW.  Discussed need for RW and 3N1 at home.   Gait Pattern: Step-through pattern;Decreased stride length;Trunk flexed;Wide base of support Gait velocity: decreased Stairs: No Wheelchair Mobility Wheelchair Mobility: No         PT Diagnosis: Generalized weakness  PT Problem List: Decreased activity tolerance;Decreased balance;Decreased mobility;Decreased knowledge of use of DME;Decreased safety awareness PT Treatment Interventions: DME instruction;Gait training;Functional mobility training;Therapeutic activities;Therapeutic exercise;Balance training;Cognitive remediation;Patient/family education     PT Goals(Current goals can be found in the care plan section) Acute Rehab PT Goals Patient Stated Goal: to be independent PT Goal Formulation: With patient Time For Goal Achievement: 10/22/13 Potential to Achieve Goals: Good  Visit Information  Last PT Received On: 10/15/13 Assistance Needed: +1 History of Present Illness: Pt admit with CHF and afib.       Prior [place] expects to be discharged to:: Private residence Living Arrangements: Spouse/significant other Available Help at Discharge: Family;Available 24 hours/day Type of Home: Apartment Home Access: Level entry Home Layout: One level Home Equipment:  (uses golf club as cane) Prior  Function Level of Independence: Independent Communication Communication: No difficulties Dominant Hand: Right    Cognition  Cognition Arousal/Alertness:  Awake/alert Behavior During Therapy: WFL for tasks assessed/performed Overall Cognitive Status: Within Functional Limits for tasks assessed    Extremity/Trunk Assessment Upper Extremity Assessment Upper Extremity Assessment: Defer to OT evaluation Lower Extremity Assessment Lower Extremity Assessment: Generalized weakness Cervical / Trunk Assessment Cervical / Trunk Assessment: Kyphotic   Balance Balance Balance Assessed: Yes Static Sitting Balance Static Sitting - Balance Support: Bilateral upper extremity supported;Feet supported Static Sitting - Level of Assistance: 5: Stand by assistance Static Sitting - Comment/# of Minutes: 2 Static Standing Balance Static Standing - Balance Support: Bilateral upper extremity supported;During functional activity Static Standing - Level of Assistance: 5: Stand by assistance Static Standing - Comment/# of Minutes: 2 High Level Balance High Level Balance Activites: Direction changes;Turns;Sudden stops;Head turns High Level Balance Comments: Pt unsteady losing balance in all directions without device.   End of Session PT - End of Session Equipment Utilized During Treatment: Gait belt;Oxygen Activity Tolerance: Patient limited by fatigue Patient left: in chair;with call bell/phone within reach;with chair alarm set;with family/visitor present Nurse Communication: Mobility status       INGOLD,Ritta Hammes 10/15/2013, 10:45 AM [facility] [phone] (pager)

## 2013-10-16 DIAGNOSIS — E876 Hypokalemia: Secondary | ICD-10-CM

## 2013-10-16 DIAGNOSIS — R42 Dizziness and giddiness: Secondary | ICD-10-CM

## 2013-10-16 MED ORDER — POTASSIUM CHLORIDE CRYS ER 20 MEQ PO TBCR
40.0000 meq | EXTENDED_RELEASE_TABLET | ORAL | Status: AC
  Administered 2013-10-16 (×2): 40 meq via ORAL
  Filled 2013-10-16 (×2): qty 2

## 2013-10-16 NOTE — Plan of Care (Signed)
Problem: Phase I Progression Outcomes Goal: Initial discharge plan identified Outcome: Completed/Met Date Met:  10/16/13 Lives with wife

## 2013-10-16 NOTE — Plan of Care (Signed)
Problem: Phase I Progression Outcomes Goal: EF % per last Echo/documented,Core Reminder form on chart Outcome: Completed/Met Date Met:  10/16/13 EF = 35-40% from 10/03/11

## 2013-10-16 NOTE — Progress Notes (Signed)
Physical Therapy Treatment Patient Details Name: [name] [name] MRN: [id number] DOB: 06-Apr-1932 Today's Date: 10/16/2013 Time: [id number] PT Time Calculation (min): 26 min  PT Assessment / Plan / Recommendation  History of Present Illness Pt admit with CHF and afib.   PT Comments   Pt admitted with above. Pt currently with functional limitations due to continued balance  Deficits due to weakness. Pt will benefit from skilled PT to increase their independence and safety with mobility to allow discharge to the venue listed below.   Follow Up Recommendations  Home health PT;Supervision/Assistance - 24 hour                 Equipment Recommendations  Rolling walker with 5" wheels;3in1 (PT)        Frequency Min 3X/week   Progress towards PT Goals Progress towards PT goals: Progressing toward goals  Plan Current plan remains appropriate    Precautions / Restrictions Precautions Precautions: Fall Restrictions Weight Bearing Restrictions: No   Pertinent Vitals/Pain VSS, no pain    Mobility  Bed Mobility Bed Mobility: Rolling Right;Right Sidelying to Sit;Sitting - Scoot to Edge of Bed Rolling Right: 4: Min assist Right Sidelying to Sit: 4: Min assist Sitting - Scoot to Edge of Bed: 4: Min assist Details for Bed Mobility Assistance: Assist and cues for LEs and for elevation of trunk. Transfers Transfers: Sit to Stand;Stand to Sit Sit to Stand: 4: Min assist;With upper extremity assist;From bed Stand to Sit: 4: Min assist;With upper extremity assist;To chair/3-in-1;With armrests Details for Transfer Assistance: cues for hand placement.  Pt needed steadying assist as he used urinal.   Ambulation/Gait Ambulation/Gait Assistance: 4: Min assist Ambulation Distance (Feet): 200 Feet Assistive device: 1 person hand held assist Ambulation/Gait Assistance Details: Pt ambulated with HHA on on left with unsteady gait overall with pt losing balance left several times.  Will still need RW on  d/c.   Gait Pattern: Step-through pattern;Decreased stride length;Trunk flexed;Wide base of support Gait velocity: decreased Stairs: No Wheelchair Mobility Wheelchair Mobility: No    Exercises General Exercises - Lower Extremity Ankle Circles/Pumps: AROM Long Arc Quad: AROM;Both;10 reps;Seated Hip Flexion/Marching: AROM;Both;10 reps;Seated    PT Goals (current goals can now be found in the care plan section)    Visit Information  Last PT Received On: 10/16/13 Assistance Needed: +1 History of Present Illness: Pt admit with CHF and afib.    Subjective Data  Subjective: "Maybe today I will lean to the right instead of the left to balance out."   Cognition  Cognition Arousal/Alertness: Awake/alert Behavior During Therapy: WFL for tasks assessed/performed Overall Cognitive Status: Within Functional Limits for tasks assessed    Balance  Static Sitting Balance Static Sitting - Balance Support: Bilateral upper extremity supported;Feet supported Static Sitting - Level of Assistance: 5: Stand by assistance Static Sitting - Comment/# of Minutes: 2 Static Standing Balance Static Standing - Balance Support: Bilateral upper extremity supported;During functional activity Static Standing - Level of Assistance: 5: Stand by assistance Static Standing - Comment/# of Minutes: 2 min washing hands at sink High Level Balance High Level Balance Activites: Direction changes;Turns;Sudden stops;Head turns High Level Balance Comments: Pt unsteady losing balance in all directions without device.   End of Session PT - End of Session Equipment Utilized During Treatment: Gait belt;Oxygen Activity Tolerance: Patient limited by fatigue Patient left: in chair;with call bell/phone within reach;with chair alarm set;with family/visitor present Nurse Communication: Mobility status      INGOLD,Mida Cory 10/16/2013, 10:59 AM Yasira Engelson [place],PT Acute  Rehabilitation [phone] (pager)

## 2013-10-16 NOTE — Progress Notes (Addendum)
[facility] PROGRESS NOTE  Assessment/Plan: Respiratory failure, acute multifactorial/Acute systolic congestive heart failure/  Bronchitis, acute - continue tapering steroids, continue Inhalers and antibiotics. - Cont IV lasix has for 1 more day; will also continue stricit I and O's, daily weights. - monitor and replete electrolytes. Cardiac markers negative. - estimated dry weight 160 lb. -will check BNP in am -low dose ACE to be added at discharge  Oral pharyngeal candidiasis: -cont diflucan. - no dysphagia.   Atrial fibrillation: - rate controlled. - continue amiodarone, digoxin and metoprolol.  CAD (coronary artery disease) - continue B-blockers and ASA  Hypokalemia -will replete  Dizziness -continue holding norvasc and lisinopril -orthostatic neg -BP well controlled currently -follow VS   Code Status: full Family Communication: wife  Disposition Plan: continue inpatient treatment; will discharge home with [facility] services   Consultants:  none  Procedures:  CXR  Antibiotics:  azithro  HPI/Subjective: Breathing and feeling a lot better. Afebrile. Denies CP.  Objective: Filed Vitals:   10/15/13 2016 10/16/13 0344 10/16/13 0912 10/16/13 1059  BP: 127/55 130/52  115/50  Pulse: 68 59  62  Temp: 98.4 F (36.9 C)   98.4 F (36.9 C)  TempSrc: Oral   Oral  Resp: 18 18  20   Height:      Weight:  74.7 kg (164 lb 10.9 oz)    SpO2: 99% 94% 96% 94%    Intake/Output Summary (Last 24 hours) at 10/16/13 1311 Last data filed at 10/16/13 0901  Gross per 24 hour  Intake    940 ml  Output    625 ml  Net    315 ml   Filed Weights   10/14/13 1741 10/15/13 0524 10/16/13 0344  Weight: 75.4 kg (166 lb 3.6 oz) 74.98 kg (165 lb 4.8 oz) 74.7 kg (164 lb 10.9 oz)    Exam:  General: Alert, awake, oriented x3, in no acute distress.  HEENT: No bruits, no goiter; mild JVD Heart: Regular rate and rhythm, without murmurs, rubs, gallops.  Lungs: Good air movement,  clear to auscultation Abdomen: Soft, nontender, nondistended, positive bowel sounds.  Extremities: no edema or cyanosis  Data Reviewed: Basic Metabolic Panel:  Recent Labs Lab 10/14/13 1239 10/15/13 0515  NA 136 138  K 3.5 3.3*  CL 99 98  CO2 27 29  GLUCOSE 104* 96  BUN 15 14  CREATININE 1.05 1.03  CALCIUM 8.8 8.4   Liver Function Tests:  Recent Labs Lab 10/14/13 1239  AST 30  ALT 18  ALKPHOS 41  BILITOT 1.0  PROT 6.5  ALBUMIN 3.2*   CBC:  Recent Labs Lab 10/14/13 1239 10/15/13 0515  WBC 5.4 7.4  NEUTROABS 4.4  --   HGB 12.9* 12.4*  HCT 38.5* 37.0*  MCV 93.9 93.2  PLT 136* 136*   Cardiac Enzymes:  Recent Labs Lab 10/14/13 1239 10/14/13 1850 10/14/13 2200 10/15/13 0515  TROPONINI <0.30 <0.30 <0.30 <0.30   BNP (last 3 results)  Recent Labs  10/14/13 1239  PROBNP 3714.0*   Studies: Ct Head Wo Contrast  10/14/2013   CLINICAL DATA:  Intermittent dizziness. Not feeling well since last Thursday. Several falls without injury.  EXAM: CT HEAD WITHOUT CONTRAST  TECHNIQUE: Contiguous axial images were obtained from the base of the skull through the vertex without intravenous contrast.  COMPARISON:  None.  FINDINGS: No skull fracture or intracranial hemorrhage.  Small vessel disease type changes without CT evidence of large acute infarct.  No hydrocephalus.  Mild global atrophy.  No  intracranial mass lesion noted on this unenhanced exam.  Vascular calcifications.  IMPRESSION: No skull fracture or intracranial hemorrhage.  Small vessel disease type changes without CT evidence of large acute infarct.   Electronically Signed   By: Chauncey Cruel M.D.   On: 10/14/2013 14:27   Ct Chest Wo Contrast  10/14/2013   CLINICAL DATA:  Abnormal chest x-ray.  EXAM: CT CHEST WITHOUT CONTRAST  TECHNIQUE: Multidetector CT imaging of the chest was performed following the standard protocol without IV contrast.  COMPARISON:  Multiple prior chest x-rays.  FINDINGS: The chest wall is  unremarkable. A permanent left-sided pacemaker is noted. No breast masses, supraclavicular or axillary adenopathy. The bony thorax is intact. No destructive bone lesions or spinal canal compromise. Surgical changes from bypass surgery are noted.  The heart is normal in size. No pericardial effusion. Scattered mediastinal and hilar lymph nodes throughout. The esophagus is grossly normal. The aorta demonstrates tortuosity, ectasia and calcification. Dense coronary artery calcifications are noted.  Examination of the lung parenchyma demonstrates patchy bilateral ground-glass opacity, right greater than left. Findings could be secondary to asymmetric pulmonary edema or other partial airspace filling process such as infection alveolitis. No worrisome pulmonary masses. There are small bilateral pleural effusions with overlying bibasilar atelectasis. The tracheobronchial tree is grossly normal.  The upper abdomen is unremarkable.  IMPRESSION: Diffuse but patchy and asymmetric airspace process with ground-glass opacities throughout. Findings could be secondary to asymmetric pulmonary edema or other partial airspace filling process such as infection, hemorrhage or alveolitis.  Small bilateral pleural effusions.  Scattered borderline mediastinal and hilar lymph nodes.   Electronically Signed   By: Kalman Jewels M.D.   On: 10/14/2013 17:19    Scheduled Meds: . amiodarone  100 mg Oral QODAY  . amiodarone  200 mg Oral QODAY  . azithromycin  500 mg Oral Daily  . digoxin  0.0625 mg Oral Daily  . enoxaparin (LOVENOX) injection  40 mg Subcutaneous Q24H  . fluconazole  100 mg Oral Daily  . furosemide  20 mg Intravenous BID  . metoprolol  50 mg Oral BID  . pantoprazole  80 mg Oral Q1200  . polyethylene glycol  17 g Oral Daily  . potassium chloride  40 mEq Oral Q4H  . predniSONE  40 mg Oral Q breakfast    Shulamis Wenberg  [facility] Pager [phone]. If 8PM-8AM, please contact night-coverage at [email or web address],  password [facility] 10/16/2013, 1:11 PM  LOS: 2 days

## 2013-10-17 DIAGNOSIS — J9600 Acute respiratory failure, unspecified whether with hypoxia or hypercapnia: Secondary | ICD-10-CM

## 2013-10-17 LAB — BASIC METABOLIC PANEL
CO2: 26 mEq/L (ref 19–32)
Chloride: 99 mEq/L (ref 96–112)
Creatinine, Ser: 0.95 mg/dL (ref 0.50–1.35)
Glucose, Bld: 93 mg/dL (ref 70–99)
Potassium: 4.6 mEq/L (ref 3.5–5.1)
Sodium: 136 mEq/L (ref 135–145)

## 2013-10-17 MED ORDER — DM-GUAIFENESIN ER 30-600 MG PO TB12: 1.0000 | ORAL_TABLET | Freq: Two times a day (BID) | ORAL | Status: DC | PRN

## 2013-10-17 MED ORDER — PREDNISONE 20 MG PO TABS: ORAL_TABLET | ORAL | Status: DC

## 2013-10-17 MED ORDER — TEMAZEPAM 7.5 MG PO CAPS: 7.5000 mg | ORAL_CAPSULE | Freq: Every evening | ORAL | Status: DC | PRN

## 2013-10-17 MED ORDER — FUROSEMIDE 40 MG PO TABS: 40.0000 mg | ORAL_TABLET | Freq: Every day | ORAL | Status: DC

## 2013-10-17 MED ORDER — FLUCONAZOLE 100 MG PO TABS: 100.0000 mg | ORAL_TABLET | Freq: Every day | ORAL | Status: DC

## 2013-10-17 MED ORDER — AZITHROMYCIN 500 MG PO TABS: 500.0000 mg | ORAL_TABLET | Freq: Every day | ORAL | Status: DC

## 2013-10-17 NOTE — Discharge Summary (Addendum)
Physician Discharge Summary  [name] [name] [id number] DOB: Feb 24, 1932 DOA: 10/14/2013  PCP: Jerlyn Ly, MD  Admit date: 10/14/2013 Discharge date: 10/17/2013  Time spent: >30 minutes  Recommendations for Outpatient Follow-up:  1. BMET to follow electrolytes and renal function 2. Reassess BP and adjust medications as needed 3. Patient complaining of essential tremors; please reassess and decide if further treatment required; improvement seen with b-blocker; but might need primidone if still an issue.  BNP    Component Value Date/Time   PROBNP 2360.0* 10/17/2013 0603   Filed Weights   10/15/13 0524 10/16/13 0344 10/17/13 0443  Weight: 74.98 kg (165 lb 4.8 oz) 74.7 kg (164 lb 10.9 oz) 73.483 kg (162 lb)     Discharge Diagnoses:  Principal Problem:   Acute systolic congestive heart failure Active Problems:   Atrial fibrillation   CAD (coronary artery disease)   Bronchitis, acute   Oral pharyngeal candidiasis   Dizziness   Respiratory failure, acute   Discharge Condition: stable and improved. Will follow with PCP in 1 week. Instructions for low sodium diet and fluid restriction provided.   Diet recommendation: heart healthy/low sodium diet; advise on fluid restriction (1.5-2L daily)   History of present illness:  79 y.o. male with PMH significant for CAD s/p CABG [id number], systolic CHF EF 35 to 40 % by ECHO 2012, S/P ICD, history of A fib no candidate for anticoagulation due to history of GI bleed who presents to the ED complaining Of cough, dyspnea at rest and on exertion that started 5 days prior to admission. He also report fevers, tempeture at 101. Dyspnea at times is worse during coughing spell. He report productive cough specially in the morning. Later during the day is a dry cough.  He denies increase weight, or swelling, no chest pain, or wheezes. He denies nausea, vomiting. He continue to smoke one cigar per day.  He also report dizziness, lightheaded specially  during standing. No headaches. Of note patient fell 5 days prior to admission. He fell backward. No loss of consciousness. He denies any pain.  He has not been taking lasix for last week. He only takes lasix as needed.  Patient oxygen saturation drop to 87 on ambulation in the ED.    Hospital Course:  Respiratory failure, acute multifactorial/Acute systolic congestive heart failure/ Bronchitis, acute  -continue tapering steroids and antibiotics.  -will discharge on lasix 40mg  daily; advice about low sodium diet and fluid restriction - estimated dry weight 160 lb.  -BNP with good improvement and weight 162 at discharge -continue B-blocker and low dose lisinopril -s/p ICD   Oral pharyngeal candidiasis:  -cont diflucan. Most likely due to prednisone and abx's -no dysphagia.   Atrial fibrillation:  -rate controlled.  -continue amiodarone, digoxin and metoprolol.   CAD (coronary artery disease)  -no CP, no ischemic changes on telemetry or EKG; troponin negative. -continue B-blockers and ASA   Hypokalemia  -repleted -will continue daily maintenance especially with daily use of lasix.  HTN and Dizziness  -will discontinue norvasc -continue lisinopril, metoprolol and now lasix -BP stable and orthostatic VS neg at discharge -reassess BP at PCP follow up visit and adjust medications as needed  Insomnia -will start low dose restoril PRN.  Physical deconditioning  -will arrange [facility] services and provide rolling walker.   Procedures:  See below for x-ray reports  Consultations:  None   Discharge Exam: Filed Vitals:   10/17/13 0959  BP: 128/48  Pulse:   Temp:   Resp:  General: Alert, awake, oriented x3, in no acute distress.  HEENT: No bruits, no goiter; no JVD  Heart: Regular rate and rhythm, without murmurs, rubs, gallops.  Lungs: Good air movement, clear to auscultation  Abdomen: Soft, nontender, nondistended, positive bowel sounds.  Extremities: no edema or  cyanosis   Discharge Instructions  Discharge Orders   Future Orders Complete By Expires   Discharge instructions  As directed    Comments:     Take medications as prescribed Follow a low sodium diet (< 2000mg  daily) Fluid restriction 1.5-2L of fluids in 24 hours Arrange follow up with PCP in 1 week       Medication List    STOP taking these medications       amLODipine 5 MG tablet  Commonly known as:  NORVASC      TAKE these medications       amiodarone 200 MG tablet  Commonly known as:  PACERONE  Take 100-200 mg by mouth daily. Take  200 mg alternating with 100 mg daily.     azithromycin 500 MG tablet  Commonly known as:  ZITHROMAX  Take 1 tablet (500 mg total) by mouth daily.     cholecalciferol 1000 UNITS tablet  Commonly known as:  VITAMIN D  Take 1,000 Units by mouth daily.     dextromethorphan-guaiFENesin 30-600 MG per 12 hr tablet  Commonly known as:  MUCINEX DM  Take 1 tablet by mouth 2 (two) times daily as needed for cough.     digoxin 0.125 MG tablet  Commonly known as:  LANOXIN  Take 0.0625 mg by mouth daily. Take a half of a tablet daily     diphenoxylate-atropine 2.5-0.025 MG per tablet  Commonly known as:  LOMOTIL  - Take 2 tablets by mouth as needed. For diarrhea  -      esomeprazole 40 MG capsule  Commonly known as:  NEXIUM  Take 40 mg by mouth daily at 12 noon.     fluconazole 100 MG tablet  Commonly known as:  DIFLUCAN  Take 1 tablet (100 mg total) by mouth daily.     furosemide 40 MG tablet  Commonly known as:  LASIX  Take 1 tablet (40 mg total) by mouth daily.     metoprolol 50 MG tablet  Commonly known as:  LOPRESSOR  Take 1 tablet (50 mg total) by mouth 2 (two) times daily.     multivitamins ther. w/minerals Tabs tablet  Take 1 tablet by mouth daily.     potassium chloride SA 20 MEQ tablet  Commonly known as:  K-DUR,KLOR-CON  Take 1 tablet (20 mEq total) by mouth daily.     predniSONE 20 MG tablet  Commonly known as:   DELTASONE  Take 2 tablets by mouth daily X 1 day; then change to 1 tablet by mouth daily X 2 days; then 1/2 tablet by mouth daily X 2 days and stop prednisone     psyllium 0.52 G capsule  Commonly known as:  REGULOID  Take 0.52 g by mouth as needed.     ramipril 10 MG tablet  Commonly known as:  ALTACE  Take 10 mg by mouth daily.     rosuvastatin 5 MG tablet  Commonly known as:  CRESTOR  Take 5 mg by mouth 2 (two) times a week.     temazepam 7.5 MG capsule  Commonly known as:  RESTORIL  Take 1 capsule (7.5 mg total) by mouth at bedtime as needed for sleep.  Allergies  Allergen Reactions  . Aspirin   . Iron   . Lescol [Fluvastatin Sodium]     Dizziness  . Pentazocine Lactate   . Vytorin [Ezetimibe-Simvastatin]     Leg cramps        Follow-up Information   Follow up with PERINI,MARK A, MD. Schedule an appointment as soon as possible for a visit in 1 week.   Specialty:  Internal Medicine   Contact information:   [address] [place] [address] [phone]       The results of significant diagnostics from this hospitalization (including imaging, microbiology, ancillary and laboratory) are listed below for reference.    Significant Diagnostic Studies: Dg Chest 2 View  10/14/2013   CLINICAL DATA:  Shortness of breath, weakness.  EXAM: CHEST  2 VIEW  COMPARISON:  10/04/2011.  FINDINGS: Trachea is midline. Left subclavian pacemaker and ICD lead tips project over the right atrium and right ventricle. Airspace opacification in the right perihilar region appears unchanged from 10/04/2011. Question mild fibrotic changes at the lung bases. Blunting of the costophrenic angles appears chronic.  IMPRESSION: 1. No acute findings. 2. Patchy right perihilar opacification is unchanged from 10/04/2011, with probable mild coarsening of the pulmonary markings at the lung bases. If further evaluation for interstitial lung disease is desired, non emergent high-resolution chest CT  without contrast could be performed.   Electronically Signed   By: Lorin Picket M.D.   On: 10/14/2013 12:31   Ct Head Wo Contrast  10/14/2013   CLINICAL DATA:  Intermittent dizziness. Not feeling well since last Thursday. Several falls without injury.  EXAM: CT HEAD WITHOUT CONTRAST  TECHNIQUE: Contiguous axial images were obtained from the base of the skull through the vertex without intravenous contrast.  COMPARISON:  None.  FINDINGS: No skull fracture or intracranial hemorrhage.  Small vessel disease type changes without CT evidence of large acute infarct.  No hydrocephalus.  Mild global atrophy.  No intracranial mass lesion noted on this unenhanced exam.  Vascular calcifications.  IMPRESSION: No skull fracture or intracranial hemorrhage.  Small vessel disease type changes without CT evidence of large acute infarct.   Electronically Signed   By: Chauncey Cruel M.D.   On: 10/14/2013 14:27   Ct Chest Wo Contrast  10/14/2013   CLINICAL DATA:  Abnormal chest x-ray.  EXAM: CT CHEST WITHOUT CONTRAST  TECHNIQUE: Multidetector CT imaging of the chest was performed following the standard protocol without IV contrast.  COMPARISON:  Multiple prior chest x-rays.  FINDINGS: The chest wall is unremarkable. A permanent left-sided pacemaker is noted. No breast masses, supraclavicular or axillary adenopathy. The bony thorax is intact. No destructive bone lesions or spinal canal compromise. Surgical changes from bypass surgery are noted.  The heart is normal in size. No pericardial effusion. Scattered mediastinal and hilar lymph nodes throughout. The esophagus is grossly normal. The aorta demonstrates tortuosity, ectasia and calcification. Dense coronary artery calcifications are noted.  Examination of the lung parenchyma demonstrates patchy bilateral ground-glass opacity, right greater than left. Findings could be secondary to asymmetric pulmonary edema or other partial airspace filling process such as infection  alveolitis. No worrisome pulmonary masses. There are small bilateral pleural effusions with overlying bibasilar atelectasis. The tracheobronchial tree is grossly normal.  The upper abdomen is unremarkable.  IMPRESSION: Diffuse but patchy and asymmetric airspace process with ground-glass opacities throughout. Findings could be secondary to asymmetric pulmonary edema or other partial airspace filling process such as infection, hemorrhage or alveolitis.  Small bilateral  pleural effusions.  Scattered borderline mediastinal and hilar lymph nodes.   Electronically Signed   By: Kalman Jewels M.D.   On: 10/14/2013 17:19   Labs: Basic Metabolic Panel:  Recent Labs Lab 10/14/13 1239 10/15/13 0515 10/17/13 0603  NA 136 138 136  K 3.5 3.3* 4.6  CL 99 98 99  CO2 27 29 26   GLUCOSE 104* 96 93  BUN 15 14 22   CREATININE 1.05 1.03 0.95  CALCIUM 8.8 8.4 9.1   Liver Function Tests:  Recent Labs Lab 10/14/13 1239  AST 30  ALT 18  ALKPHOS 41  BILITOT 1.0  PROT 6.5  ALBUMIN 3.2*   CBC:  Recent Labs Lab 10/14/13 1239 10/15/13 0515  WBC 5.4 7.4  NEUTROABS 4.4  --   HGB 12.9* 12.4*  HCT 38.5* 37.0*  MCV 93.9 93.2  PLT 136* 136*   Cardiac Enzymes:  Recent Labs Lab 10/14/13 1239 10/14/13 1850 10/14/13 2200 10/15/13 0515  TROPONINI <0.30 <0.30 <0.30 <0.30   BNP: BNP (last 3 results)  Recent Labs  10/14/13 1239 10/17/13 0603  PROBNP 3714.0* 2360.0*    Signed:  Sota Hetz  [facility] 10/17/2013, 11:50 AM

## 2013-10-17 NOTE — Plan of Care (Signed)
Problem: Phase II Progression Outcomes Goal: Fluid volume status improved Outcome: Progressing Continues to receive IV Lasix BID.  Monitoring intake and output, daily weights.  SOB improved.  Will continue to monitor.

## 2013-10-28 ENCOUNTER — Encounter: Payer: Self-pay | Admitting: Physician Assistant

## 2013-10-28 ENCOUNTER — Ambulatory Visit (INDEPENDENT_AMBULATORY_CARE_PROVIDER_SITE_OTHER): Payer: Medicare PPO | Admitting: Physician Assistant

## 2013-10-28 VITALS — BP 122/74 | HR 54 | Ht 71.0 in | Wt 161.1 lb

## 2013-10-28 DIAGNOSIS — I2510 Atherosclerotic heart disease of native coronary artery without angina pectoris: Secondary | ICD-10-CM

## 2013-10-28 DIAGNOSIS — I255 Ischemic cardiomyopathy: Secondary | ICD-10-CM

## 2013-10-28 DIAGNOSIS — G250 Essential tremor: Secondary | ICD-10-CM

## 2013-10-28 DIAGNOSIS — I2589 Other forms of chronic ischemic heart disease: Secondary | ICD-10-CM

## 2013-10-28 DIAGNOSIS — E785 Hyperlipidemia, unspecified: Secondary | ICD-10-CM

## 2013-10-28 DIAGNOSIS — I10 Essential (primary) hypertension: Secondary | ICD-10-CM

## 2013-10-28 DIAGNOSIS — I509 Heart failure, unspecified: Secondary | ICD-10-CM

## 2013-10-28 DIAGNOSIS — I5022 Chronic systolic (congestive) heart failure: Secondary | ICD-10-CM

## 2013-10-28 DIAGNOSIS — I4891 Unspecified atrial fibrillation: Secondary | ICD-10-CM

## 2013-10-28 MED ORDER — POTASSIUM CHLORIDE CRYS ER 20 MEQ PO TBCR: EXTENDED_RELEASE_TABLET | ORAL | Status: DC

## 2013-10-28 MED ORDER — FUROSEMIDE 40 MG PO TABS: ORAL_TABLET | ORAL | Status: DC

## 2013-10-28 NOTE — Patient Instructions (Signed)
Your physician has recommended you make the following change in your medication:   1. Decrease lasix 40mg  to Mon, Wed and Fri.  2. Decrease potassium 20 MEQ to Mon, Wed, and Fri.  If you gain 2-3lbs overnight and you havent taken your lasix that day; then go ahead and take your lasix (always take potassium pill with lasix). If you have gained 2-3lbs and you have already taken the lasix then you need to call our office.  Your physician recommends that you schedule a follow-up appointment in: 3 months with Dr. [name].

## 2013-10-28 NOTE — Progress Notes (Addendum)
[address], [place], [place]  [address] Phone: [phone] Fax:  [phone]  Date:  10/28/2013   ID:  [name] [name], DOB 1932/09/02, MRN [id number]  PCP:  Jerlyn Ly, MD  Cardiologist:  Dr. Peter [name]   Electrophysiologist:  Dr. [name]    History of Present Illness: [name] [name] is a 79 y.o. male with a hx of CAD, s/p remote inferolateral myocardial infarction, s/p CABG in 2002, ICM with EF of [id number], chronic systolic CHF, s/p AICD, paroxysmal atrial fibrillation controlled on amiodarone, prior major GI bleed (not a candidate for anticoagulation), HTN, colon CA status post resection/chemotherapy/radiation. Myoview 9/09: Anterolateral scar, no ischemia. Echo (09/2011): Moderate LVH, EF 35-40%, posterior lateral akinesis, inferior akinesis, grade 2 diastolic dysfunction, mild MR, moderate LAE, mild RAE, PASP 53.  He was recently admitted [id number] with a/c systolic CHF in the setting of bronchitis/pneumonia. He was treated with a combination of IV Lasix, antibiotics as well as steroids.  He was not followed by cardiology.  He is feeling much better since he was d/c from the hospital.  He is breathing better. Denies chest pain.  No syncope.  No orthopnea, PND, edema.  He would like to change how he takes his lasix.  He was previously on this PRN.  He has seen his PCP who did obtain labs last week.  He continues to be troubled by intention tremors.  He tells me that his PCP had no other options to treat these.  We had a long discussion regarding continuing his current dose of amiodarone and beta blocker.     Recent Labs: 10/14/2013: ALT 18  10/15/2013: Hemoglobin 12.4*  10/17/2013: Creatinine 0.95; Potassium 4.6; Pro B Natriuretic peptide (BNP) 2360.0* 10/22/2013: K 5.1, creatinine 1.1, Hgb 13.3, ALT 41 (done at PCPs office)  Wt Readings from Last 3 Encounters:  10/28/13 161 lb 1.9 oz (73.084 kg)  10/17/13 162 lb (73.483 kg)  07/24/13 173 lb 1.9 oz (78.527  kg)     Past Medical History  Diagnosis Date  . Colon cancer     s/p chemo, RT, sugery  . Colorectal anastomotic stricture   . Cardiomyopathy     s/p ICD 09/30/08; EF 35 to 40% per echo in October 2021  . Iron deficiency anemia   . Diverticulosis   . ICD (implantable cardiac defibrillator) discharge October 2012    ICD shock for afib with RVR  . ED (erectile dysfunction)   . CAD (coronary artery disease)     Old inferolateral MI s/p CABG in 2002  . HTN (hypertension)   . Paroxysmal atrial fibrillation     controlled with amiodarone, not a coumadin candidate due to GI bleeding  . GERD (gastroesophageal reflux disease)     rarely  . CAP (community acquired pneumonia) 10/03/2011  . Chronic systolic CHF (congestive heart failure) 07/24/2013    Current Outpatient Prescriptions  Medication Sig Dispense Refill  . amiodarone (PACERONE) 200 MG tablet Take 100-200 mg by mouth daily. Take  200 mg alternating with 100 mg daily.      . cholecalciferol (VITAMIN D) 1000 UNITS tablet Take 1,000 Units by mouth daily.       Marland Kitchen dextromethorphan-guaiFENesin (MUCINEX DM) 30-600 MG per 12 hr tablet Take 1 tablet by mouth 2 (two) times daily as needed for cough.  40 tablet  0  . digoxin (LANOXIN) 0.125 MG tablet Take 0.0625 mg by mouth daily. Take a half of a tablet daily      .  diphenoxylate-atropine (LOMOTIL) 2.5-0.025 MG per tablet Take 2 tablets by mouth as needed. For diarrhea       . esomeprazole (NEXIUM) 40 MG capsule Take 40 mg by mouth daily at 12 noon.      . furosemide (LASIX) 40 MG tablet 1 tablet MWF  30 tablet  1  . metoprolol (LOPRESSOR) 50 MG tablet Take 1 tablet (50 mg total) by mouth 2 (two) times daily.  180 tablet  3  . Multiple Vitamins-Minerals (MULTIVITAMINS THER. W/MINERALS) TABS Take 1 tablet by mouth daily.        . potassium chloride SA (K-DUR,KLOR-CON) 20 MEQ tablet 1 tablet MWF  30 tablet  6  . PROAIR HFA 108 (90 BASE) MCG/ACT inhaler Inhale 2 puffs into the lungs every 4  (four) hours as needed.       . psyllium (REGULOID) 0.52 G capsule Take 0.52 g by mouth as needed.       . ramipril (ALTACE) 10 MG tablet Take 10 mg by mouth daily.        . rosuvastatin (CRESTOR) 5 MG tablet Take 5 mg by mouth 2 (two) times a week.      . temazepam (RESTORIL) 7.5 MG capsule Take 1 capsule (7.5 mg total) by mouth at bedtime as needed for sleep.  30 capsule  0   No current facility-administered medications for this visit.    Allergies:   Aspirin; Iron; Lescol; Pentazocine lactate; and Vytorin   Social History:  The patient  reports that he has been smoking Cigars.  He has never used smokeless tobacco. He reports that he does not drink alcohol or use illicit drugs.   Family History:  The patient's family history includes Colon cancer in his father; Colon polyps in his brother; Diabetes in his sister; Pancreatic cancer (age of onset: 24) in his brother.   ROS:  Please see the history of present illness.     All other systems reviewed and negative.   PHYSICAL EXAM: VS:  BP 122/74  Pulse 54  Ht 5\' 11"  (1.803 m)  Wt 161 lb 1.9 oz (73.084 kg)  BMI 22.48 kg/m2 Well nourished, well developed, in no acute distress HEENT: normal Neck: no JVD Cardiac:  normal S1, S2; RRR; no murmur Lungs:  Right basilar crackles; no wheezing Abd: soft, nontender, no hepatomegaly Ext: no edema Skin: warm and dry Neuro:  CNs 2-12 intact, no focal abnormalities noted  EKG:  A paced, HR 54, no changes     ASSESSMENT AND PLAN:  1. Pneumonia:  Resolving.  Continue f/u with primary care. 2. Chronic Systolic CHF:  Volume stable.  We had a long discussion regarding the importance of weighing daily.  He would like to change his Lasix to Mon, Wed, Fri.  I think this would be ok.  He knows to take his K+ with his lasix and to call if his weight is up 2-3 pounds in one day. 3. Ischemic CM: continue beta blocker, ACEI. 4. CAD:  No angina.  Continue statin. He cannot take ASA due to hx of GI  bleeding. 5. Atrial Fibrillation:  Maintaining NSR.  Continue current dose of amiodarone.  He is not a candidate for anticoagulation due to hx of GI bleed.  6. Essential Tremor:  I would not change his current dose of amiodarone or beta blocker.  He should continue to follow up with primary care for other options for treatment.   7. Hypertension:  He was taken off of amlodipine in  the hospital due to orthostatic intolerance.  His BP is controlled. 8. Hyperlipidemia:  Continue statin. 9. Disposition:  F/u with Dr. Peter [name] in 3 mos. F/u with Dr. [name] as planned.   Signed, Richardson Dopp, PA-C  10/28/2013 1:34 PM

## 2013-11-05 ENCOUNTER — Other Ambulatory Visit: Payer: Self-pay | Admitting: Cardiology

## 2013-12-02 ENCOUNTER — Encounter: Payer: Self-pay | Admitting: Cardiology

## 2014-01-27 ENCOUNTER — Ambulatory Visit (INDEPENDENT_AMBULATORY_CARE_PROVIDER_SITE_OTHER): Payer: Medicare PPO | Admitting: Cardiology

## 2014-01-27 ENCOUNTER — Encounter: Payer: Self-pay | Admitting: Cardiology

## 2014-01-27 VITALS — BP 123/82 | HR 62 | Ht 71.0 in | Wt 173.0 lb

## 2014-01-27 DIAGNOSIS — I509 Heart failure, unspecified: Secondary | ICD-10-CM

## 2014-01-27 DIAGNOSIS — I5022 Chronic systolic (congestive) heart failure: Secondary | ICD-10-CM

## 2014-01-27 DIAGNOSIS — I4891 Unspecified atrial fibrillation: Secondary | ICD-10-CM

## 2014-01-27 DIAGNOSIS — I2510 Atherosclerotic heart disease of native coronary artery without angina pectoris: Secondary | ICD-10-CM

## 2014-01-27 DIAGNOSIS — I255 Ischemic cardiomyopathy: Secondary | ICD-10-CM

## 2014-01-27 DIAGNOSIS — I2589 Other forms of chronic ischemic heart disease: Secondary | ICD-10-CM

## 2014-01-27 NOTE — Patient Instructions (Signed)
Continue your current therapy  I will see you in 6 months.   

## 2014-01-27 NOTE — Progress Notes (Signed)
[name] [name] Date of Birth: 1932-11-20 Medical Record [id number]  History of Present Illness: [name] [name] is seen back today for a followup visit. He has a history of coronary disease with remote inferior lateral myocardial infarction. He is status post CABG in 2002. He has an ischemic cardiomyopathy with ejection fraction of 35-40%. He was admitted in November with bronchitis/PNA with some CHF. Since then he has done well without significant SOB, chest pain, or dyspnea. He reports his tremor has improved with reduction in his amiodarone dose. No recurrent arrhythmia. He is taking lasix 3 days a week and reports his weight is stable at home.  Current Outpatient Prescriptions on File Prior to Visit  Medication Sig Dispense Refill  . amiodarone (PACERONE) 200 MG tablet Take 100-200 mg by mouth daily. Take  200 mg alternating with 100 mg daily.      . cholecalciferol (VITAMIN D) 1000 UNITS tablet Take 1,000 Units by mouth daily.       Marland Kitchen dextromethorphan-guaiFENesin (MUCINEX DM) 30-600 MG per 12 hr tablet Take 1 tablet by mouth 2 (two) times daily as needed for cough.  40 tablet  0  . digoxin (LANOXIN) 0.125 MG tablet Take 0.0625 mg by mouth daily. Take a half of a tablet daily      . diphenoxylate-atropine (LOMOTIL) 2.5-0.025 MG per tablet Take 2 tablets by mouth as needed. For diarrhea       . esomeprazole (NEXIUM) 40 MG capsule Take 40 mg by mouth daily at 12 noon.      . furosemide (LASIX) 40 MG tablet 1 tablet MWF  30 tablet  1  . metoprolol (LOPRESSOR) 50 MG tablet TAKE 1 TABLET TWICE A DAY  180 tablet  0  . Multiple Vitamins-Minerals (MULTIVITAMINS THER. W/MINERALS) TABS Take 1 tablet by mouth daily.        . potassium chloride SA (K-DUR,KLOR-CON) 20 MEQ tablet 1 tablet MWF  30 tablet  6  . PROAIR HFA 108 (90 BASE) MCG/ACT inhaler Inhale 2 puffs into the lungs every 4 (four) hours as needed.       . psyllium (REGULOID) 0.52 G capsule Take 0.52 g by mouth as needed.       . ramipril (ALTACE)  10 MG tablet Take 10 mg by mouth daily.        . rosuvastatin (CRESTOR) 5 MG tablet Take 5 mg by mouth 2 (two) times a week.      . temazepam (RESTORIL) 7.5 MG capsule Take 1 capsule (7.5 mg total) by mouth at bedtime as needed for sleep.  30 capsule  0   No current facility-administered medications on file prior to visit.    Allergies  Allergen Reactions  . Aspirin   . Iron   . Lescol [Fluvastatin Sodium]     Dizziness  . Pentazocine Lactate   . Vytorin [Ezetimibe-Simvastatin]     Leg cramps     Past Medical History  Diagnosis Date  . Colon cancer     s/p chemo, RT, sugery  . Colorectal anastomotic stricture   . Cardiomyopathy     s/p ICD 09/30/08; EF 35 to 40% per echo in October 2021  . Iron deficiency anemia   . Diverticulosis   . ICD (implantable cardiac defibrillator) discharge October 2012    ICD shock for afib with RVR  . ED (erectile dysfunction)   . CAD (coronary artery disease)     Old inferolateral MI s/p CABG in 2002  . HTN (hypertension)   .  Paroxysmal atrial fibrillation     controlled with amiodarone, not a coumadin candidate due to GI bleeding  . GERD (gastroesophageal reflux disease)     rarely  . CAP (community acquired pneumonia) 10/03/2011  . Chronic systolic CHF (congestive heart failure) 07/24/2013    Past Surgical History  Procedure Laterality Date  . Vasectomy    . Colon resection      x2  . Coronary artery bypass graft    . Cholecystectomy    . Back surgery    . Hernia repair    . Cardiac defibrillator placement  09/30/08    by Greggory Brandy  . Cardiac catheterization  11/07/2000    EF 32%  . US echocardiography  08/11/2008    EF 30-35%  . Cardiovascular stress test  08/06/2008    EF 30%  . Appendectomy      History  Smoking status  . Former Smoker  . Types: Cigars  Smokeless tobacco  . Never Used    History  Alcohol Use No    Family History  Problem Relation Age of Onset  . Colon cancer Father   . Diabetes Sister   . Colon polyps  Brother   . Pancreatic cancer Brother 31    Review of Systems: The review of systems is per the HPI.   No ICD shocks.  Does note his balance is a little  impaired.  All other systems were reviewed and are negative.  Physical Exam: BP 123/82  Pulse 62  Ht 5\' 11"  (1.803 m)  Wt 173 lb (78.472 kg)  BMI 24.14 kg/m2 Patient is very pleasant and in no acute distress. Skin is warm and dry. Color is normal.  HEENT is unremarkable. Normocephalic/atraumatic. PERRL. Sclera are nonicteric. Neck is supple. No masses. No JVD. Lungs are clear. Cardiac exam shows a regular rate and rhythm. Normal S1 and S2. [id number] apical systolic murmur. Abdomen is soft. Extremities are without edema. Gait and ROM are intact. No gross neurologic deficits noted.   LABORATORY DATA:  Dated 12/06/13: Creatinine 1.3. All chemistries and CBC are normal. Total cholesterol 186, triglycerides 112, HDL 50, LDL 114. UA normal. TSH 2.68. A1c 5.2 %.  Assessment / Plan: 1. Atrial fibrillation. He is maintaining sinus rhythm on amiodarone.  Not a candidate for anticoagulation due to major GI bleed history. Improved tremor with reduction in amiodarone. Follow up with Dr. Rayann Heman in device clinic in April. I will follow up in 6 months.  2. Ischemic cardiomyopathy status post ICD implant. He is well compensated today without significant edema and his weight is stable from last August. We will continue with Lasix 3 x per week.  3. Coronary disease with old inferiolateral myocardial infarction. Status post CABG in 2002. Currently asymptomatic. Continue metoprolol. He is not a candidate for antiplatelet or anticoagulant therapy because of recurrent GI bleeding.  4. Hypertension, controlled.  5. Hypercholesterolemia. Now taking crestor 3 days a week.

## 2014-02-13 ENCOUNTER — Other Ambulatory Visit: Payer: Self-pay | Admitting: Cardiology

## 2014-02-20 ENCOUNTER — Other Ambulatory Visit: Payer: Self-pay

## 2014-03-13 ENCOUNTER — Other Ambulatory Visit: Payer: Self-pay

## 2014-03-13 MED ORDER — DIGOXIN 125 MCG PO TABS: ORAL_TABLET | ORAL | Status: DC

## 2014-03-18 ENCOUNTER — Other Ambulatory Visit: Payer: Self-pay | Admitting: Dermatology

## 2014-03-26 ENCOUNTER — Encounter: Payer: Self-pay | Admitting: Internal Medicine

## 2014-03-26 ENCOUNTER — Ambulatory Visit (INDEPENDENT_AMBULATORY_CARE_PROVIDER_SITE_OTHER): Payer: Medicare PPO | Admitting: Internal Medicine

## 2014-03-26 VITALS — BP 138/84 | HR 83 | Ht 71.0 in | Wt 170.0 lb

## 2014-03-26 DIAGNOSIS — I5021 Acute systolic (congestive) heart failure: Secondary | ICD-10-CM

## 2014-03-26 DIAGNOSIS — I255 Ischemic cardiomyopathy: Secondary | ICD-10-CM

## 2014-03-26 DIAGNOSIS — I495 Sick sinus syndrome: Secondary | ICD-10-CM

## 2014-03-26 DIAGNOSIS — Z95810 Presence of automatic (implantable) cardiac defibrillator: Secondary | ICD-10-CM

## 2014-03-26 DIAGNOSIS — I4891 Unspecified atrial fibrillation: Secondary | ICD-10-CM

## 2014-03-26 DIAGNOSIS — I5022 Chronic systolic (congestive) heart failure: Secondary | ICD-10-CM

## 2014-03-26 DIAGNOSIS — I509 Heart failure, unspecified: Secondary | ICD-10-CM

## 2014-03-26 DIAGNOSIS — I2589 Other forms of chronic ischemic heart disease: Secondary | ICD-10-CM

## 2014-03-26 NOTE — Patient Instructions (Signed)
Remote monitoring is used to monitor your Pacemaker of ICD from home. This monitoring reduces the number of office visits required to check your device to one time per year. It allows [place] to keep an eye on the functioning of your device to ensure it is working properly. You are scheduled for a device check from home on June 25, 2014. You may send your transmission at any time that day. If you have a wireless device, the transmission will be sent automatically. After your physician reviews your transmission, you will receive a postcard with your next transmission date.  Your physician wants you to follow-up in: 12 months with Dr Rayann Heman.  You will receive a reminder letter in the mail two months in advance. If you don't receive a letter, please call our office to schedule the follow-up appointment.  We will enroll you in the heart failure remote monitoring clinic - Nira Conn will call you around

## 2014-03-26 NOTE — Progress Notes (Signed)
PCP: Jerlyn Ly, MD Primary Cardiologist: [name] is a 80 y.o. male who presents today for routine electrophysiology followup.  Since last being seen in our clinic, the patient reports doing very well.  Device interrogation today demonstrates new persistent atrial fibrillation since March.  His ventricular rates are relatively well controlled and he is asymptomatic.  He has had increased number of Optivol crossings since reverting to atrial fibrillation.  He has a history of GI bleeding and has been told he is not a candidate for anticoagulation.   Today, he denies symptoms of palpitations, chest pain, shortness of breath,  lower extremity edema, dizziness, presyncope, syncope, or ICD shocks.  The patient is otherwise without complaint today.   Past Medical History  Diagnosis Date  . Colon cancer     s/p chemo, RT, sugery  . Colorectal anastomotic stricture   . Cardiomyopathy     s/p ICD 09/30/08; EF 35 to 40% per echo in October 2021  . Iron deficiency anemia   . Diverticulosis   . ICD (implantable cardiac defibrillator) discharge October 2012    ICD shock for afib with RVR  . ED (erectile dysfunction)   . CAD (coronary artery disease)     Old inferolateral MI s/p CABG in 2002  . HTN (hypertension)   . Paroxysmal atrial fibrillation     controlled with amiodarone, not a coumadin candidate due to GI bleeding  . GERD (gastroesophageal reflux disease)     rarely  . CAP (community acquired pneumonia) 10/03/2011  . Chronic systolic CHF (congestive heart failure) 07/24/2013   Past Surgical History  Procedure Laterality Date  . Vasectomy    . Colon resection      x2  . Coronary artery bypass graft    . Cholecystectomy    . Back surgery    . Hernia repair    . Cardiac defibrillator placement  09/30/08    MDT dual chamber ICD placed by Dr Rayann Heman  . Cardiac catheterization  11/07/2000    EF 32%  . US echocardiography  08/11/2008    EF 30-35%  . Cardiovascular stress  test  08/06/2008    EF 30%  . Appendectomy      Current Outpatient Prescriptions  Medication Sig Dispense Refill  . amiodarone (PACERONE) 200 MG tablet Take 100-200 mg by mouth daily. Take  200 mg alternating with 100 mg daily.      . cholecalciferol (VITAMIN D) 1000 UNITS tablet Take 1,000 Units by mouth daily.       Marland Kitchen dextromethorphan-guaiFENesin (MUCINEX DM) 30-600 MG per 12 hr tablet Take 1 tablet by mouth 2 (two) times daily as needed for cough.  40 tablet  0  . digoxin (LANOXIN) 0.125 MG tablet Take 1/2 tablet daily      . diphenoxylate-atropine (LOMOTIL) 2.5-0.025 MG per tablet Take 2 tablets by mouth as needed. For diarrhea       . fluorouracil (EFUDEX) 5 % cream Apply 1 application topically 2 (two) times daily.      . furosemide (LASIX) 40 MG tablet Take 1 tablet on Monday, Wednesday and Friday      . metoprolol (LOPRESSOR) 50 MG tablet TAKE 1 TABLET TWICE A DAY  180 tablet  2  . Multiple Vitamins-Minerals (MULTIVITAMINS THER. W/MINERALS) TABS Take 1 tablet by mouth daily.        . potassium chloride SA (K-DUR,KLOR-CON) 20 MEQ tablet Take 1 tablet on Monday, Wednesday and Friday      .  PROAIR HFA 108 (90 BASE) MCG/ACT inhaler Inhale 2 puffs into the lungs every 4 (four) hours as needed.       . psyllium (REGULOID) 0.52 G capsule Take 2 capsules by mouth daily.       . ramipril (ALTACE) 10 MG tablet Take 10 mg by mouth daily.        . rosuvastatin (CRESTOR) 5 MG tablet Take 5 mg by mouth 2 (two) times a week.      . temazepam (RESTORIL) 7.5 MG capsule Take 1 capsule (7.5 mg total) by mouth at bedtime as needed for sleep.  30 capsule  0  . vitamin B-12 (CYANOCOBALAMIN) 1000 MCG tablet Take 1,000 mcg by mouth daily.       No current facility-administered medications for this visit.    Physical Exam: Filed Vitals:   03/26/14 1154  BP: 138/84  Pulse: 83  Height: 5\' 11"  (1.803 m)  Weight: 170 lb (77.111 kg)    GEN- The patient is well appearing, alert and oriented x 3 today.    Head- normocephalic, atraumatic Eyes-  Sclera clear, conjunctiva pink Ears- hearing intact Oropharynx- clear Lungs- Clear to ausculation bilaterally, normal work of breathing Chest- ICD pocket is well healed Heart- irregular rate and rhythm, no murmurs, rubs or gallops, PMI not laterally displaced GI- soft, NT, ND, + BS Extremities- no clubbing, cyanosis, or edema  ICD interrogation- reviewed in detail today,  See PACEART report  Assessment and Plan:  1.  Chronic systolic dysfunction euvolemic today Stable on an appropriate medical regimen Normal ICD function See Pace Art report No changes today  2.  Atrial fibrillation He has returned to persistent afib.  Presently he is asymptomatic.  He is not felt to be a candidate for anticoagulation.   I did confirm this with Dr [name] who recommends rate control for now. Dr Barrett Shell to follow clinical.  We will enroll in the ICM device clinic to follow optivol. If he develops progressive symptoms the we should consider anticoagulation at least temporarily in order to restore sinus rhythm.  carelink Enroll in [facility] device clinic Follow-up with Dr [name] I will see in a year

## 2014-03-27 ENCOUNTER — Encounter: Payer: Self-pay | Admitting: *Deleted

## 2014-03-27 ENCOUNTER — Telehealth: Payer: Self-pay | Admitting: Internal Medicine

## 2014-03-27 NOTE — Telephone Encounter (Signed)
I attempted to call the patient to enroll in the [facility] clinic. I stated he could call me back today, or I will try him again on Monday.

## 2014-03-27 NOTE — Telephone Encounter (Signed)
I spoke with the patient. 

## 2014-03-29 ENCOUNTER — Other Ambulatory Visit: Payer: Self-pay | Admitting: Cardiology

## 2014-04-17 ENCOUNTER — Ambulatory Visit (INDEPENDENT_AMBULATORY_CARE_PROVIDER_SITE_OTHER): Payer: Medicare PPO | Admitting: Cardiology

## 2014-04-17 ENCOUNTER — Encounter: Payer: Self-pay | Admitting: Cardiology

## 2014-04-17 VITALS — BP 138/70 | HR 83 | Ht 71.0 in | Wt 169.4 lb

## 2014-04-17 DIAGNOSIS — I4891 Unspecified atrial fibrillation: Secondary | ICD-10-CM

## 2014-04-17 DIAGNOSIS — I255 Ischemic cardiomyopathy: Secondary | ICD-10-CM

## 2014-04-17 DIAGNOSIS — I509 Heart failure, unspecified: Secondary | ICD-10-CM

## 2014-04-17 DIAGNOSIS — I5022 Chronic systolic (congestive) heart failure: Secondary | ICD-10-CM

## 2014-04-17 DIAGNOSIS — I2510 Atherosclerotic heart disease of native coronary artery without angina pectoris: Secondary | ICD-10-CM

## 2014-04-17 DIAGNOSIS — I2589 Other forms of chronic ischemic heart disease: Secondary | ICD-10-CM

## 2014-04-17 MED ORDER — AMIODARONE HCL 200 MG PO TABS: 100.0000 mg | ORAL_TABLET | Freq: Every day | ORAL | Status: DC

## 2014-04-17 NOTE — Progress Notes (Signed)
[name] [name] Date of Birth: 05-Sep-1932 Medical Record [id number]  History of Present Illness: [name] [name] is seen back today for a followup visit. He has a history of coronary disease with remote inferior lateral myocardial infarction. He is status post CABG in 2002. He has an ischemic cardiomyopathy with ejection fraction of 35-40%. He is s/p ICD implant. He also has a history of atrial fibrillation and has been on amiodarone. When seen in the device clinic in April it was noted that he was in atrial fibrillation since March. Rate is controlled. He denies any palpitations. He thinks he may be a little bit more SOB this past year but it doesn't bother him and he still works. He is not a candidate for anticoagulation due to history of major GI bleed.   Current Outpatient Prescriptions on File Prior to Visit  Medication Sig Dispense Refill  . cholecalciferol (VITAMIN D) 1000 UNITS tablet Take 1,000 Units by mouth daily.       Marland Kitchen dextromethorphan-guaiFENesin (MUCINEX DM) 30-600 MG per 12 hr tablet Take 1 tablet by mouth 2 (two) times daily as needed for cough.  40 tablet  0  . digoxin (LANOXIN) 0.125 MG tablet Take 1/2 tablet daily      . diphenoxylate-atropine (LOMOTIL) 2.5-0.025 MG per tablet Take 2 tablets by mouth as needed. For diarrhea       . fluorouracil (EFUDEX) 5 % cream Apply 1 application topically 2 (two) times daily.      . furosemide (LASIX) 40 MG tablet Take 1 tablet on Monday, Wednesday and Friday      . metoprolol (LOPRESSOR) 50 MG tablet TAKE 1 TABLET TWICE A DAY  180 tablet  2  . Multiple Vitamins-Minerals (MULTIVITAMINS THER. W/MINERALS) TABS Take 1 tablet by mouth daily.        . potassium chloride SA (K-DUR,KLOR-CON) 20 MEQ tablet Take 1 tablet on Monday, Wednesday and Friday      . PROAIR HFA 108 (90 BASE) MCG/ACT inhaler Inhale 2 puffs into the lungs every 4 (four) hours as needed.       . psyllium (REGULOID) 0.52 G capsule Take 2 capsules by mouth daily.       . ramipril  (ALTACE) 10 MG tablet Take 10 mg by mouth daily.        . rosuvastatin (CRESTOR) 5 MG tablet Take 5 mg by mouth 2 (two) times a week.      . temazepam (RESTORIL) 7.5 MG capsule Take 1 capsule (7.5 mg total) by mouth at bedtime as needed for sleep.  30 capsule  0  . vitamin B-12 (CYANOCOBALAMIN) 1000 MCG tablet Take 1,000 mcg by mouth daily.       No current facility-administered medications on file prior to visit.    Allergies  Allergen Reactions  . Aspirin   . Iron   . Lescol [Fluvastatin Sodium]     Dizziness  . Pentazocine Lactate   . Vytorin [Ezetimibe-Simvastatin]     Leg cramps     Past Medical History  Diagnosis Date  . Colon cancer     s/p chemo, RT, sugery  . Colorectal anastomotic stricture   . Cardiomyopathy     s/p ICD 09/30/08; EF 35 to 40% per echo in October 2021  . Iron deficiency anemia   . Diverticulosis   . ICD (implantable cardiac defibrillator) discharge October 2012    ICD shock for afib with RVR  . ED (erectile dysfunction)   . CAD (coronary artery disease)  Old inferolateral MI s/p CABG in 2002  . HTN (hypertension)   . Paroxysmal atrial fibrillation     controlled with amiodarone, not a coumadin candidate due to GI bleeding  . GERD (gastroesophageal reflux disease)     rarely  . CAP (community acquired pneumonia) 10/03/2011  . Chronic systolic CHF (congestive heart failure) 07/24/2013    Past Surgical History  Procedure Laterality Date  . Vasectomy    . Colon resection      x2  . Coronary artery bypass graft    . Cholecystectomy    . Back surgery    . Hernia repair    . Cardiac defibrillator placement  09/30/08    MDT dual chamber ICD placed by Dr Rayann Heman  . Cardiac catheterization  11/07/2000    EF 32%  . US echocardiography  08/11/2008    EF 30-35%  . Cardiovascular stress test  08/06/2008    EF 30%  . Appendectomy      History  Smoking status  . Former Smoker  . Types: Cigars  Smokeless tobacco  . Never Used    History   Alcohol Use No    Family History  Problem Relation Age of Onset  . Colon cancer Father   . Diabetes Sister   . Colon polyps Brother   . Pancreatic cancer Brother 46    Review of Systems: The review of systems is per the HPI.   No ICD shocks.  He has a mild tremor.  All other systems were reviewed and are negative.  Physical Exam: BP 138/70  Pulse 83  Ht 5\' 11"  (1.803 m)  Wt 169 lb 6.4 oz (76.839 kg)  BMI 23.64 kg/m2  SpO2 98% Patient is very pleasant and in no acute distress. Skin is warm and dry. Color is normal.  HEENT is unremarkable. Normocephalic/atraumatic. PERRL. Sclera are nonicteric. Neck is supple. No masses. No JVD. Lungs are clear. Cardiac exam shows a regular rate and rhythm. Normal S1 and S2. [id number] apical systolic murmur. Abdomen is soft. Extremities are without edema. Gait and ROM are intact. No gross neurologic deficits noted.   LABORATORY DATA:    Assessment / Plan: 1. Atrial fibrillation. He has persistent Afib since March.  Not a candidate for anticoagulation due to major GI bleed history. Rate is well controlled and he is minimally symptomatic. I have recommended reduction in amiodarone to 100 mg daily since we are using this for rate control only now. This may help with his tremor. Since he is not a candidate for anticoagulation would not recommend cardioversion or ablation.  2. Ischemic cardiomyopathy status post ICD implant. He is well compensated today without significant edema and his weight is stable from last August. We will continue with Lasix 3 x per week.  3. Coronary disease with old inferiolateral myocardial infarction. Status post CABG in 2002. Currently asymptomatic. Continue metoprolol. He is not a candidate for antiplatelet or anticoagulant therapy because of recurrent GI bleeding.  4. Hypertension, controlled.  5. Hypercholesterolemia.

## 2014-04-17 NOTE — Patient Instructions (Signed)
Reduce amiodarone to 100 mg daily  Continue your other therapy  I will see you in 6 months. 

## 2014-04-24 ENCOUNTER — Encounter: Payer: Self-pay | Admitting: *Deleted

## 2014-04-24 ENCOUNTER — Ambulatory Visit (INDEPENDENT_AMBULATORY_CARE_PROVIDER_SITE_OTHER): Payer: Medicare PPO | Admitting: *Deleted

## 2014-04-24 DIAGNOSIS — I5022 Chronic systolic (congestive) heart failure: Secondary | ICD-10-CM

## 2014-04-24 DIAGNOSIS — I509 Heart failure, unspecified: Secondary | ICD-10-CM

## 2014-04-24 DIAGNOSIS — Z95810 Presence of automatic (implantable) cardiac defibrillator: Secondary | ICD-10-CM

## 2014-04-24 MED ORDER — FUROSEMIDE 40 MG PO TABS: ORAL_TABLET | ORAL | Status: DC

## 2014-04-24 NOTE — Progress Notes (Signed)
EPIC Encounter for ICM Monitoring  Patient Name: [name] [name] is a 80 y.o. male Date: 04/24/2014 Primary Care Physican: Jerlyn Ly, MD Primary Cardiologist: [name] Electrophysiologist: Allred Dry Weight: 168 lbs       In the past month, have you:  1. Gained more than 2 pounds in a day or more than 5 pounds in a week? no  2. Had changes in your medications (with verification of current medications)? Yes. Dr. [name] decreased amiodarone to 100 mg daily on 04/17/14. The patient feels his balance is better as well as the trembling in his hands. He is not on anticoagulation due to a history of GI bleeding.  3. Had more shortness of breath than is usual for you? no  4. Limited your activity because of shortness of breath? no  5. Not been able to sleep because of shortness of breath? no  6. Had increased swelling in your feet or ankles? no  7. Had symptoms of dehydration (dizziness, dry mouth, increased thirst, decreased urine output) no  8. Had changes in sodium restriction? no  9. Been compliant with medication? Yes   ICM trend:   Follow-up plan: ICM clinic phone appointment: 05/26/14. The patient's optivol readings have been spiking over the last few months. The patient has been in persistent a-fib since March. He reports that he only occasionally feels SOB. Dr. [name] saw him last week and felt that his fluid levels were doing well, so no changes made to his lasix. Since the patient continues to feel well, no changes will be made today. I will follow up with him in a month. I have advised him to please call for increasing weights, increasing SOB, or swelling. He voices understanding.   Copy of note sent to patient's primary care physician, primary cardiologist, and device following physician.  Emily Filbert, RN, BSN 04/24/2014 10:23 AM

## 2014-05-06 ENCOUNTER — Telehealth: Payer: Self-pay | Admitting: *Deleted

## 2014-05-06 NOTE — Telephone Encounter (Signed)
Ok to send in nitro for patient? Not on med list, but he stated that Dr [name] prescribed this for him a very long time ago. Please advise. Thanks, MI

## 2014-05-06 NOTE — Telephone Encounter (Signed)
Returned call to patient he stated he use to take NTG as needed.Stated he would like a refill.Stated he continues to be sob and thought he would try NTG to see if it would help.Message sent to [place] for advice.

## 2014-05-07 MED ORDER — NITROGLYCERIN 0.4 MG SL SUBL: 0.4000 mg | SUBLINGUAL_TABLET | SUBLINGUAL | Status: DC | PRN

## 2014-05-07 NOTE — Telephone Encounter (Signed)
Returned call to patient Dr.Jordan advised ok to try NTG.Advised to call back if needed.

## 2014-05-07 NOTE — Addendum Note (Signed)
Addended by: Golden Hurter D on: 05/07/2014 05:13 PM   Modules accepted: Orders

## 2014-05-07 NOTE — Telephone Encounter (Signed)
OK to try Ntg sl to see if this will help symptoms.  Moishy Laday [name] MD, [facility]

## 2014-05-26 ENCOUNTER — Ambulatory Visit (INDEPENDENT_AMBULATORY_CARE_PROVIDER_SITE_OTHER): Payer: Medicare PPO | Admitting: *Deleted

## 2014-05-26 ENCOUNTER — Encounter: Payer: Self-pay | Admitting: *Deleted

## 2014-05-26 DIAGNOSIS — Z95810 Presence of automatic (implantable) cardiac defibrillator: Secondary | ICD-10-CM

## 2014-05-26 DIAGNOSIS — I509 Heart failure, unspecified: Secondary | ICD-10-CM

## 2014-05-26 DIAGNOSIS — I5022 Chronic systolic (congestive) heart failure: Secondary | ICD-10-CM

## 2014-05-26 NOTE — Progress Notes (Signed)
EPIC Encounter for ICM Monitoring  Patient Name: [name] [name] is a 80 y.o. male Date: 05/26/2014 Primary Care Physican: Jerlyn Ly, MD Primary Cardiologist: [name] Electrophysiologist: Allred Dry Weight: 168 lbs       In the past month, have you:  1. Gained more than 2 pounds in a day or more than 5 pounds in a week? no  2. Had changes in your medications (with verification of current medications)? No. He does feel that his tremors have improved since Dr. [name] decreased his amiodarone to 100 mg daily on 04/17/14.  3. Had more shortness of breath than is usual for you? no  4. Limited your activity because of shortness of breath? no  5. Not been able to sleep because of shortness of breath? no  6. Had increased swelling in your feet or ankles? No. He does report intermittent edema that improves with elevation of his extremities. He also states this is a problem he notices during the summers.  7. Had symptoms of dehydration (dizziness, dry mouth, increased thirst, decreased urine output) no  8. Had changes in sodium restriction? no  9. Been compliant with medication? Yes   ICM trend:   Follow-up plan: ICM clinic phone appointment: 06/26/14. Optivol trends have improved since last transmission. The patient is without symptoms currently. His rates are still controlled on the lower dose of amiodarone. Will forward to Dr. [name] and Dr. Rayann Heman for review.  Copy of note sent to patient's primary care physician, primary cardiologist, and device following physician.  Alvis Lemmings, RN, BSN 05/26/2014 10:01 AM

## 2014-06-26 ENCOUNTER — Encounter: Payer: Medicare PPO | Admitting: *Deleted

## 2014-07-02 ENCOUNTER — Encounter: Payer: Self-pay | Admitting: *Deleted

## 2014-07-03 ENCOUNTER — Ambulatory Visit (INDEPENDENT_AMBULATORY_CARE_PROVIDER_SITE_OTHER): Payer: Medicare PPO | Admitting: Cardiology

## 2014-07-03 ENCOUNTER — Encounter: Payer: Self-pay | Admitting: Cardiology

## 2014-07-03 VITALS — BP 152/108 | HR 89 | Ht 71.0 in | Wt 173.7 lb

## 2014-07-03 DIAGNOSIS — I4891 Unspecified atrial fibrillation: Secondary | ICD-10-CM

## 2014-07-03 DIAGNOSIS — I2589 Other forms of chronic ischemic heart disease: Secondary | ICD-10-CM

## 2014-07-03 DIAGNOSIS — I5022 Chronic systolic (congestive) heart failure: Secondary | ICD-10-CM

## 2014-07-03 DIAGNOSIS — Z8719 Personal history of other diseases of the digestive system: Secondary | ICD-10-CM

## 2014-07-03 DIAGNOSIS — Z95810 Presence of automatic (implantable) cardiac defibrillator: Secondary | ICD-10-CM

## 2014-07-03 DIAGNOSIS — I509 Heart failure, unspecified: Secondary | ICD-10-CM

## 2014-07-03 DIAGNOSIS — I10 Essential (primary) hypertension: Secondary | ICD-10-CM

## 2014-07-03 DIAGNOSIS — I255 Ischemic cardiomyopathy: Secondary | ICD-10-CM

## 2014-07-03 NOTE — Assessment & Plan Note (Signed)
Pt here with complaints of increasing SOB and edema.

## 2014-07-03 NOTE — Progress Notes (Signed)
07/03/2014 PRISH TRIMPER   [birth year]  TV:[id number]  Primary Physicia Jerlyn Ly, MD Primary Cardiologist: Dr [name]  HPI:  Pleasant 80 y/o followed by Dr [name] and Dr Joylene Draft with a history of CAD and AF. 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            He had remote PTCA's and eventually CABG in '02. He has not required any further intervention. He has ICM and had a MDT ICD placed in 2009. He is followed by Dr Joylene Grapes. The pt has permanent AF. He is Amiodarone for rate control. He has had significant GI bleeding in the past and is not a candidate for Coumadin or ASA. His last echo showed an EF of 35-40% in 2012.          He is in the office today with complaints of increasing edema and SOB. He recently saw Dr Joylene Draft about this. Dr Joylene Draft ordered PFTs and increased his Lasix to 40 mg daily from 3 x week. After his PFTs he resumed his inhaler. He was told he would need to give up his daily cigar, which he has. With the above changes he has noticed improvement in his symptoms. He sleeps on two pillows. He notices a cough when he lays flat. He denies any chest pain.   Current Outpatient Prescriptions  Medication Sig Dispense Refill  . amiodarone (PACERONE) 200 MG tablet Take 0.5 tablets (100 mg total) by mouth daily.  90 tablet  1  . cholecalciferol (VITAMIN D) 1000 UNITS tablet Take 1,000 Units by mouth daily.       Marland Kitchen dextromethorphan-guaiFENesin (MUCINEX DM) 30-600 MG per 12 hr tablet Take 1 tablet by mouth 2 (two) times daily as needed for cough.  40 tablet  0  . digoxin (LANOXIN) 0.125 MG tablet Take 1/2 tablet daily      . diphenoxylate-atropine (LOMOTIL) 2.5-0.025 MG per tablet Take 2 tablets by mouth as needed. For diarrhea       . fluorouracil (EFUDEX) 5 % cream Apply 1 application topically 2 (two) times daily.      . furosemide (LASIX) 40 MG tablet Take 20 mg by  mouth daily.      . metoprolol (LOPRESSOR) 50 MG tablet TAKE 1 TABLET TWICE A DAY  180 tablet  2  . Multiple Vitamins-Minerals (MULTIVITAMINS THER. W/MINERALS) TABS Take 1 tablet by mouth daily.        . nitroGLYCERIN (NITROSTAT) 0.4 MG SL tablet Place 1 tablet (0.4 mg total) under the tongue every 5 (five) minutes as needed for chest pain.  25 tablet  6  . potassium chloride SA (K-DUR,KLOR-CON) 20 MEQ tablet Take 20 mEq by mouth daily.       Marland Kitchen PROAIR HFA 108 (90 BASE) MCG/ACT inhaler Inhale 2 puffs into the lungs every 4 (four) hours as needed.       . psyllium (REGULOID) 0.52 G capsule Take 2 capsules by mouth daily.       . ramipril (ALTACE) 10 MG tablet Take 10 mg by mouth daily.        . rosuvastatin (CRESTOR) 5 MG tablet Take 5 mg by mouth 2 (two) times a week.      . temazepam (RESTORIL) 7.5 MG capsule Take 1 capsule (7.5 mg total) by mouth at bedtime as needed for  sleep.  30 capsule  0  . vitamin B-12 (CYANOCOBALAMIN) 1000 MCG tablet Take 1,000 mcg by mouth daily.       No current facility-administered medications for this visit.    Allergies  Allergen Reactions  . Aspirin   . Iron   . Lescol [Fluvastatin Sodium]     Dizziness  . Pentazocine Lactate   . Vytorin [Ezetimibe-Simvastatin]     Leg cramps     History   Social History  . Marital Status: Married    Spouse Name: N/A    Number of Children: 2  . Years of Education: N/A   Occupational History  . Retired     Social History Main Topics  . Smoking status: Former Smoker    Types: Cigars  . Smokeless tobacco: Never Used  . Alcohol Use: No  . Drug Use: No  . Sexual Activity: Not on file   Other Topics Concern  . Not on file   Social History Narrative   Lives in [place] with spouse.  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    Cardiovascular: negative for chest pain Dermatological: negative for rash Respiratory: negative for cough or wheezing Urologic: negative for hematuria Abdominal: negative for nausea, vomiting, diarrhea, bright red blood per rectum, melena, or hematemesis Neurologic: negative for visual changes, syncope, or dizziness All other systems reviewed and are otherwise negative except as noted above.    Blood pressure 152/108, pulse 89, height 5\' 11"  (1.803 m), weight 173 lb 11.2 oz (78.79 kg).  General appearance: alert, cooperative and no distress Neck: no carotid bruit and no JVD Lungs: few crackles Lt base Heart: irregularly irregular rhythm and 2/6 MR murmur Abdomen: soft, non-tender; bowel sounds normal; no masses,  no organomegaly and midline surgical scar Extremities: 1-2+ pre tibial edema bilat Pulses: 2+ and symmetric Skin: Skin color, texture, turgor normal. No rashes or lesions Neurologic: Grossly normal  EKG AF with CVR, LVH  ASSESSMENT AND PLAN:   Chronic systolic CHF (congestive heart failure) Pt here with complaints of increasing SOB and edema.  CAD (coronary artery disease) CABG '02  Ischemic cardiomyopathy EF 35-40% by echo Nov 2012  Permanent atrial fibrillation- on Amiodarone for rate control Rate seems fairly well controlled  History of GI bleed-  Not on ASA or Coumadin  HTN (hypertension) Repeat B/P by med 142/84   PLAN  I suggested he continue the medication changes that Dr Joylene Draft ordered. He has only been on the increased Lasix for a few days and has noticed improvement. Dr Joylene Draft has scheduled him for follow up labs. I suggested he follow up with Dr [name] in 3 months but call and be seen sooner if he has increasing dyspnea. It's unlikely we would be able to cardiovert him or ablate his AF as he cannot be anticoagulated. I discussed a low sodium diet at length with him.   Zakarie Sturdivant KPA-C 07/03/2014 9:54 AM

## 2014-07-03 NOTE — Assessment & Plan Note (Signed)
EF 35-40% by echo Nov 2012

## 2014-07-03 NOTE — Assessment & Plan Note (Signed)
Repeat B/P by med 142/84

## 2014-07-03 NOTE — Assessment & Plan Note (Signed)
CABG '02

## 2014-07-03 NOTE — Assessment & Plan Note (Signed)
Not on ASA or Coumadin

## 2014-07-03 NOTE — Patient Instructions (Signed)
Your physician recommends that you schedule a follow-up appointment in: 3 months with Dr. [name]. No changes were made today in your therapy.

## 2014-07-03 NOTE — Assessment & Plan Note (Signed)
Rate seems fairly well controlled

## 2014-07-24 ENCOUNTER — Encounter: Payer: Self-pay | Admitting: Internal Medicine

## 2014-08-17 ENCOUNTER — Other Ambulatory Visit: Payer: Self-pay | Admitting: Cardiology

## 2014-08-20 ENCOUNTER — Encounter: Payer: Self-pay | Admitting: Cardiology

## 2014-09-04 ENCOUNTER — Other Ambulatory Visit: Payer: Self-pay | Admitting: Cardiology

## 2014-09-18 ENCOUNTER — Other Ambulatory Visit: Payer: Self-pay | Admitting: Dermatology

## 2014-10-09 ENCOUNTER — Encounter: Payer: Self-pay | Admitting: Cardiology

## 2014-10-09 ENCOUNTER — Ambulatory Visit (INDEPENDENT_AMBULATORY_CARE_PROVIDER_SITE_OTHER): Payer: Medicare PPO | Admitting: Cardiology

## 2014-10-09 VITALS — BP 133/77 | HR 76 | Ht 71.0 in | Wt 167.0 lb

## 2014-10-09 DIAGNOSIS — I10 Essential (primary) hypertension: Secondary | ICD-10-CM

## 2014-10-09 DIAGNOSIS — I482 Chronic atrial fibrillation: Secondary | ICD-10-CM

## 2014-10-09 DIAGNOSIS — I25709 Atherosclerosis of coronary artery bypass graft(s), unspecified, with unspecified angina pectoris: Secondary | ICD-10-CM

## 2014-10-09 DIAGNOSIS — I5022 Chronic systolic (congestive) heart failure: Secondary | ICD-10-CM

## 2014-10-09 DIAGNOSIS — I255 Ischemic cardiomyopathy: Secondary | ICD-10-CM

## 2014-10-09 DIAGNOSIS — I4821 Permanent atrial fibrillation: Secondary | ICD-10-CM

## 2014-10-09 NOTE — Patient Instructions (Signed)
Continue your current therapy  I will see you in 6 months.   

## 2014-10-09 NOTE — Progress Notes (Signed)
[name] [name] Date of Birth: 1932/08/20 Medical Record [id number]  History of Present Illness: [name] [name] is seen back today for follow up of CAD and CHF. He has a history of coronary disease with remote inferior lateral myocardial infarction. He is status post CABG in 2002. He has an ischemic cardiomyopathy with ejection fraction of 35-40%. He is s/p ICD implant. He also has a history of atrial fibrillation and has been on amiodarone for rate control. When ICD checked in August rate is controlled. He denies any palpitations. He was seen in August with some increase SOB. He quit smoking cigars. He had PFTs by Dr. Joylene Draft and was given inhalers which he rarely uses. He denies any SOB now unless he has to walk a far distance. 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 He denies any chest pain. Amiodarone decreased to 100 mg daily in August.  Current Outpatient Prescriptions on File Prior to Visit  Medication Sig Dispense Refill  . amiodarone (PACERONE) 200 MG tablet TAKE 1 TABLET (200 MG) ALTERNATING WITH ONE-HALF TABLET (100 MG) DAILY (Patient taking differently: TAKE ONE-HALF TABLET (100 MG) DAILY) 90 tablet 1  . cholecalciferol (VITAMIN D) 1000 UNITS tablet Take 1,000 Units by mouth daily.     Marland Kitchen dextromethorphan-guaiFENesin (MUCINEX DM) 30-600 MG per 12 hr tablet Take 1 tablet by mouth 2 (two) times daily as needed for cough. 40 tablet 0  . DIGITEK 125 MCG tablet TAKE ONE-HALF (1/2) TABLET DAILY 45 tablet 0  . diphenoxylate-atropine (LOMOTIL) 2.5-0.025 MG per tablet Take 2 tablets by mouth as needed. For diarrhea     . fluorouracil (EFUDEX) 5 % cream Apply 1 application topically 2 (two) times daily.    . furosemide (LASIX) 40 MG tablet Take 20 mg by mouth daily. Or as directed    . metoprolol (LOPRESSOR) 50 MG tablet TAKE 1 TABLET TWICE A DAY 180 tablet 2  . Multiple Vitamins-Minerals (MULTIVITAMINS THER. W/MINERALS) TABS Take 1 tablet by mouth daily.      . nitroGLYCERIN (NITROSTAT) 0.4  MG SL tablet Place 1 tablet (0.4 mg total) under the tongue every 5 (five) minutes as needed for chest pain. 25 tablet 6  . potassium chloride SA (K-DUR,KLOR-CON) 20 MEQ tablet Take 20 mEq by mouth daily.     Marland Kitchen PROAIR HFA 108 (90 BASE) MCG/ACT inhaler Inhale 2 puffs into the lungs every 4 (four) hours as needed.     . rosuvastatin (CRESTOR) 5 MG tablet Take 5 mg by mouth 2 (two) times a week.    . vitamin B-12 (CYANOCOBALAMIN) 1000 MCG tablet Take 1,000 mcg by mouth daily.    . psyllium (REGULOID) 0.52 G capsule Take 2 capsules by mouth daily.     . temazepam (RESTORIL) 7.5 MG capsule Take 1 capsule (7.5 mg total) by mouth at bedtime as needed for sleep. 30 capsule 0   No current facility-administered medications on file prior to visit.    Allergies  Allergen Reactions  . Aspirin   . Iron   . Lescol [Fluvastatin Sodium]     Dizziness  . Pentazocine Lactate   . Vytorin [Ezetimibe-Simvastatin]     Leg cramps     Past Medical History  Diagnosis Date  . Colon cancer     s/p chemo, RT, sugery  . Colorectal anastomotic stricture   . Cardiomyopathy     s/p ICD 09/30/08; EF 35 to 40% per echo in October 2021  . Iron deficiency anemia   . Diverticulosis   .  ICD (implantable cardiac defibrillator) discharge October 2012    ICD shock for afib with RVR  . ED (erectile dysfunction)   . CAD (coronary artery disease)     Old inferolateral MI s/p CABG in 2002  . HTN (hypertension)   . Paroxysmal atrial fibrillation     controlled with amiodarone, not a coumadin candidate due to GI bleeding  . GERD (gastroesophageal reflux disease)     rarely  . CAP (community acquired pneumonia) 10/03/2011  . Chronic systolic CHF (congestive heart failure) 07/24/2013    Past Surgical History  Procedure Laterality Date  . Vasectomy    . Colon resection      x2  . Coronary artery bypass graft    . Cholecystectomy    . Back surgery    . Hernia repair    . Cardiac defibrillator placement  09/30/08     [facility]T dual chamber ICD placed by Dr Rayann Heman  . Cardiac catheterization  11/07/2000    EF 32%  . US echocardiography  08/11/2008    EF 30-35%  . Cardiovascular stress test  08/06/2008    EF 30%  . Appendectomy      History  Smoking status  . Former Smoker  . Types: Cigars  Smokeless tobacco  . Never Used    History  Alcohol Use No    Family History  Problem Relation Age of Onset  . Colon cancer Father   . Diabetes Sister   . Colon polyps Brother   . Pancreatic cancer Brother 87    Review of Systems: The review of systems is per the HPI.   No ICD shocks. All other systems were reviewed and are negative.  Physical Exam: BP 133/77 mmHg  Pulse 76  Ht 5\' 11"  (1.803 m)  Wt 167 lb (75.751 kg)  BMI 23.30 kg/m2 Patient is very pleasant and in no acute distress. Skin is warm and dry. Color is normal.  HEENT is unremarkable. Normocephalic/atraumatic. PERRL. Sclera are nonicteric. Neck is supple. No masses. No JVD. Lungs are clear. Cardiac exam shows a regular rate and rhythm. Normal S1 and S2. A999333 apical systolic murmur. Abdomen is soft. Extremities are without edema. Gait and ROM are intact. No gross neurologic deficits noted.   LABORATORY DATA:    Assessment / Plan: 1. Atrial fibrillation. He has chronic Afib since March.  Not a candidate for anticoagulation due to major GI bleed history. Rate is well controlled and he is minimally symptomatic.Continue amiodarone 100 mg daily. TSH was normal last January and he is scheduled for complete lab work with Dr. Joylene Draft in Dec.  Since he is not a candidate for anticoagulation would not recommend cardioversion or ablation.  2. Ischemic cardiomyopathy status post ICD implant. He is well compensated today without significant edema and his weight is stable from last August. We will continue with Lasix at current dose.  3. Coronary disease with old inferiolateral myocardial infarction. Status post CABG in 2002. Currently asymptomatic. Continue  metoprolol. He is not a candidate for antiplatelet or anticoagulant therapy because of recurrent GI bleeding.  4. Hypertension, controlled.  5. Hypercholesterolemia.

## 2014-11-04 ENCOUNTER — Encounter: Payer: Self-pay | Admitting: Cardiology

## 2014-11-15 ENCOUNTER — Other Ambulatory Visit: Payer: Self-pay | Admitting: Cardiology

## 2014-12-24 ENCOUNTER — Other Ambulatory Visit: Payer: Self-pay | Admitting: Cardiology

## 2015-01-29 ENCOUNTER — Encounter: Payer: Self-pay | Admitting: Internal Medicine

## 2015-03-01 ENCOUNTER — Other Ambulatory Visit: Payer: Self-pay | Admitting: Cardiology

## 2015-03-02 ENCOUNTER — Telehealth: Payer: Self-pay | Admitting: Internal Medicine

## 2015-03-02 NOTE — Telephone Encounter (Signed)
Will route to Dr [name] and Malachy Mood to assess  If he needs to send in a remote may do so and forward to [employer] in the device clinic

## 2015-03-02 NOTE — Telephone Encounter (Signed)
New message      Pt c/o Shortness Of Breath: STAT if SOB developed within the last 24 hours or pt is noticeably SOB on the phone  1. Are you currently SOB (can you hear that pt is SOB on the phone)? no 2. How long have you been experiencing SOB? 1 week  3. Are you SOB when sitting or when up moving around?  Moving around  4. Are you currently experiencing any other symptoms? No  (Pt uses an inhaler for COPD)  Pt want to do a pacemaker check to see if it is working

## 2015-03-02 NOTE — Telephone Encounter (Signed)
Pt stated that he does not want to send manual transmission b/c it took him 2 weeks the last time to get the results and that is not acceptable. Pt agreed to be seen in the office and he wants to know if his ICD is working like it is suppose to. Pt agreed to appt on Tuesday 4-5 at 8:30 AM.

## 2015-03-02 NOTE — Telephone Encounter (Signed)
Pt says he is still wating to hear from Dr Doug Sou office.

## 2015-03-02 NOTE — Telephone Encounter (Signed)
Pt called in asking if he can get read of pacemaker. He thinks it is not functioning correctly. Has been feeling short of breath w/ activity over last several days.  Apparently no other symptoms (pt stated he did not care to elaborate) but he notes diminished activity came on noticeably 1 1/2 weeks ago. Pt called primarily to make sure he could send device upload.  Routing to device clinic.

## 2015-03-03 ENCOUNTER — Ambulatory Visit (INDEPENDENT_AMBULATORY_CARE_PROVIDER_SITE_OTHER): Payer: Medicare PPO | Admitting: *Deleted

## 2015-03-03 DIAGNOSIS — I5022 Chronic systolic (congestive) heart failure: Secondary | ICD-10-CM

## 2015-03-03 DIAGNOSIS — I4821 Permanent atrial fibrillation: Secondary | ICD-10-CM

## 2015-03-03 DIAGNOSIS — I255 Ischemic cardiomyopathy: Secondary | ICD-10-CM

## 2015-03-03 DIAGNOSIS — R0602 Shortness of breath: Secondary | ICD-10-CM

## 2015-03-03 DIAGNOSIS — I482 Chronic atrial fibrillation: Secondary | ICD-10-CM

## 2015-03-03 LAB — MDC_IDC_ENUM_SESS_TYPE_INCLINIC
Battery Voltage: 2.64 V
Brady Statistic AP VP Percent: 0.17 %
Brady Statistic RA Percent Paced: 0.21 %
Brady Statistic RV Percent Paced: 9.1 %
HIGH POWER IMPEDANCE MEASURED VALUE: 35 Ohm
HighPow Impedance: 43 Ohm
Lead Channel Impedance Value: 560 Ohm
Lead Channel Pacing Threshold Pulse Width: 0.4 ms
Lead Channel Sensing Intrinsic Amplitude: 0.5132
Lead Channel Setting Pacing Amplitude: 2 V
Lead Channel Setting Pacing Amplitude: 2.5 V
Lead Channel Setting Pacing Pulse Width: 0.4 ms
MDC IDC MSMT LEADCHNL RV IMPEDANCE VALUE: 400 Ohm
MDC IDC MSMT LEADCHNL RV PACING THRESHOLD AMPLITUDE: 1 V
MDC IDC MSMT LEADCHNL RV SENSING INTR AMPL: 19.4138
MDC IDC SESS DTM: 20160405090730
MDC IDC SET LEADCHNL RV SENSING SENSITIVITY: 0.3 mV
MDC IDC SET ZONE DETECTION INTERVAL: 280 ms
MDC IDC STAT BRADY AP VS PERCENT: 0.04 %
MDC IDC STAT BRADY AS VP PERCENT: 8.93 %
MDC IDC STAT BRADY AS VS PERCENT: 90.86 %
Zone Setting Detection Interval: 270 ms
Zone Setting Detection Interval: 350 ms
Zone Setting Detection Interval: 370 ms
Zone Setting Detection Interval: 400 ms

## 2015-03-03 NOTE — Progress Notes (Signed)
ICD check in clinic for SOB x 2 weeks and patient's concerns about device function. Normal device function. Thresholds and sensing consistent with previous device measurements. Impedance trends stable over time. 2 monitored VT episodes-- EGMs show AF RVR- longest 25sec, pk V 140. 1 AT/AF episode- ongoing (100%- anticoag contraindicated). Histogram distribution appropriate for patient and level of activity. No changes made this session. Device programmed at appropriate safety margins. Device programmed to optimize intrinsic conduction. Battery voltage 2.64 V. Pt enrolled in remote follow-up. Optivol abnormal since mid March ongoing- pt reports no weight gain and no edema. Reviewed with GT- he recommends increasing lasix from 20mg  MWF to daily and for the patient to call Dr. [name] for follow up appt. ROV with JA 03/30/15.

## 2015-03-09 ENCOUNTER — Telehealth: Payer: Self-pay | Admitting: *Deleted

## 2015-03-09 NOTE — Telephone Encounter (Signed)
Called patient to assess symptoms since increased lasix dose per GT last week. Pt reports breathing and walking better. Pt instructed to call the office if symptoms recur.

## 2015-03-16 ENCOUNTER — Telehealth: Payer: Self-pay | Admitting: *Deleted

## 2015-03-16 NOTE — Telephone Encounter (Signed)
Called patient about remote monitoring/ICM follow up. I informed him of the benefits of the service. He voiced understanding and willingness to use the equipment but stated that he's had issues with sending the transmissions due to his land line telephone service. I told him about the [facility] cellular adaptor that [employer] offers. Patient was agreeable to use his [employer] monitor with a WireX. I explained to him that the adaptors have been on back order and may not get to him for another 6-8 weeks. Again, patient voiced understanding and agreed with plan.   Will inform Alvis Lemmings, RN to enroll pt in ICM.

## 2015-03-18 ENCOUNTER — Encounter: Payer: Self-pay | Admitting: Internal Medicine

## 2015-03-19 NOTE — Telephone Encounter (Signed)
Spoke w/ pt and he agreed to [facility] clinic. Wirex just ordered on 4-18 he will not received it until after 05-07-15. 1st transmission shceduled for 05-11-15

## 2015-03-19 NOTE — Telephone Encounter (Signed)
LMOVM for pt to return call in regards to ICM clinic.  

## 2015-03-24 ENCOUNTER — Other Ambulatory Visit: Payer: Self-pay | Admitting: Cardiology

## 2015-03-30 ENCOUNTER — Encounter: Payer: Medicare PPO | Admitting: Internal Medicine

## 2015-04-27 ENCOUNTER — Other Ambulatory Visit: Payer: Self-pay | Admitting: Cardiology

## 2015-04-30 ENCOUNTER — Encounter: Payer: Self-pay | Admitting: Cardiology

## 2015-05-06 ENCOUNTER — Ambulatory Visit (INDEPENDENT_AMBULATORY_CARE_PROVIDER_SITE_OTHER): Payer: Medicare PPO | Admitting: Cardiology

## 2015-05-06 ENCOUNTER — Other Ambulatory Visit: Payer: Self-pay | Admitting: *Deleted

## 2015-05-06 ENCOUNTER — Encounter: Payer: Self-pay | Admitting: Cardiology

## 2015-05-06 VITALS — BP 136/78 | HR 55 | Ht 69.0 in | Wt 160.0 lb

## 2015-05-06 DIAGNOSIS — I255 Ischemic cardiomyopathy: Secondary | ICD-10-CM

## 2015-05-06 DIAGNOSIS — Z8719 Personal history of other diseases of the digestive system: Secondary | ICD-10-CM | POA: Diagnosis not present

## 2015-05-06 DIAGNOSIS — I2510 Atherosclerotic heart disease of native coronary artery without angina pectoris: Secondary | ICD-10-CM | POA: Diagnosis not present

## 2015-05-06 DIAGNOSIS — I4821 Permanent atrial fibrillation: Secondary | ICD-10-CM

## 2015-05-06 DIAGNOSIS — I5022 Chronic systolic (congestive) heart failure: Secondary | ICD-10-CM | POA: Diagnosis not present

## 2015-05-06 DIAGNOSIS — I482 Chronic atrial fibrillation: Secondary | ICD-10-CM

## 2015-05-06 MED ORDER — FUROSEMIDE 40 MG PO TABS: 20.0000 mg | ORAL_TABLET | Freq: Every day | ORAL | Status: DC

## 2015-05-06 NOTE — Patient Instructions (Signed)
Continue your current therapy  I will see you in 6 months.   

## 2015-05-06 NOTE — Progress Notes (Signed)
[name] [name] Date of Birth: 09-19-1932 Medical Record [id number]  History of Present Illness: [name] [name] is seen back today for follow up of CAD and CHF. He has a history of coronary disease with remote inferior lateral myocardial infarction. He is status post CABG in 2002. He has an ischemic cardiomyopathy with ejection fraction of 35-40%. He is s/p ICD implant. He also has a history of atrial fibrillation and has been on amiodarone for rate control. When ICD checked in April rate is controlled. He denies any palpitations. He did report some increased dyspnea recently. Lasix dose increased with resolution. He states he cannot take lasix daily because he loses too much weight. Weight now down 7 lbs since our last visit. No edema. No chest pain. Reports Thyroid was a little high but when rechecked it was normal. He is drinking protein shakes to try and maintain his weight.   Current Outpatient Prescriptions on File Prior to Visit  Medication Sig Dispense Refill  . amiodarone (PACERONE) 200 MG tablet TAKE 1 TABLET ALTERNATING WITH ONE-HALF (1/2) TABLET (100MG ) DAILY (Patient taking differently: TAKE 1 TABLET  (100MG ) DAILY) 90 tablet 1  . cholecalciferol (VITAMIN D) 1000 UNITS tablet Take 1,000 Units by mouth daily.     Marland Kitchen dextromethorphan-guaiFENesin (MUCINEX DM) 30-600 MG per 12 hr tablet Take 1 tablet by mouth 2 (two) times daily as needed for cough. 40 tablet 0  . DIGITEK 125 MCG tablet TAKE ONE-HALF (1/2) TABLET DAILY 45 tablet 0  . diphenoxylate-atropine (LOMOTIL) 2.5-0.025 MG per tablet Take 2 tablets by mouth as needed. For diarrhea     . fluorouracil (EFUDEX) 5 % cream Apply 1 application topically 2 (two) times daily.    Marland Kitchen ketoconazole (NIZORAL) 2 % shampoo Apply 1 application topically 3 (three) times a week.  0  . metoprolol (LOPRESSOR) 50 MG tablet TAKE 1 TABLET TWICE A DAY 180 tablet 0  . Multiple Vitamins-Minerals (MULTIVITAMINS THER. W/MINERALS) TABS Take 1 tablet by mouth daily.       . nitroGLYCERIN (NITROSTAT) 0.4 MG SL tablet Place 1 tablet (0.4 mg total) under the tongue every 5 (five) minutes as needed for chest pain. 25 tablet 6  . potassium chloride SA (K-DUR,KLOR-CON) 20 MEQ tablet Take 20 mEq by mouth.     Marland Kitchen PROAIR HFA 108 (90 BASE) MCG/ACT inhaler Inhale 2 puffs into the lungs every 4 (four) hours as needed.     . psyllium (REGULOID) 0.52 G capsule Take 2 capsules by mouth daily.     Marland Kitchen QVAR 80 MCG/ACT inhaler Inhale 2 puffs into the lungs daily as needed.    . ramipril (ALTACE) 10 MG capsule Take 1 capsule by mouth daily.    . rosuvastatin (CRESTOR) 5 MG tablet Take 5 mg by mouth 2 (two) times a week.    . temazepam (RESTORIL) 7.5 MG capsule Take 1 capsule (7.5 mg total) by mouth at bedtime as needed for sleep. 30 capsule 0  . vitamin B-12 (CYANOCOBALAMIN) 1000 MCG tablet Take 1,000 mcg by mouth daily.     No current facility-administered medications on file prior to visit.    Allergies  Allergen Reactions  . Aspirin   . Iron   . Lescol [Fluvastatin Sodium]     Dizziness  . Pentazocine Lactate   . Vytorin [Ezetimibe-Simvastatin]     Leg cramps     Past Medical History  Diagnosis Date  . Colon cancer     s/p chemo, RT, sugery  . Colorectal anastomotic stricture   .  Cardiomyopathy     s/p ICD 09/30/08; EF 35 to 40% per echo in October 2021  . Iron deficiency anemia   . Diverticulosis   . ICD (implantable cardiac defibrillator) discharge October 2012    ICD shock for afib with RVR  . ED (erectile dysfunction)   . CAD (coronary artery disease)     Old inferolateral MI s/p CABG in 2002  . HTN (hypertension)   . Paroxysmal atrial fibrillation     controlled with amiodarone, not a coumadin candidate due to GI bleeding  . GERD (gastroesophageal reflux disease)     rarely  . CAP (community acquired pneumonia) 10/03/2011  . Chronic systolic CHF (congestive heart failure) 07/24/2013    Past Surgical History  Procedure Laterality Date  . Vasectomy     . Colon resection      x2  . Coronary artery bypass graft    . Cholecystectomy    . Back surgery    . Hernia repair    . Cardiac defibrillator placement  09/30/08    MDT dual chamber ICD placed by Dr Rayann Heman  . Cardiac catheterization  11/07/2000    EF 32%  . US echocardiography  08/11/2008    EF 30-35%  . Cardiovascular stress test  08/06/2008    EF 30%  . Appendectomy      History  Smoking status  . Former Smoker  . Types: Cigars  Smokeless tobacco  . Never Used    History  Alcohol Use No    Family History  Problem Relation Age of Onset  . Colon cancer Father   . Diabetes Sister   . Colon polyps Brother   . Pancreatic cancer Brother 29    Review of Systems: The review of systems is per the HPI.   No ICD shocks. All other systems were reviewed and are negative.  Physical Exam: BP 136/78 mmHg  Pulse 55  Ht 5\' 9"  (1.753 m)  Wt 72.576 kg (160 lb)  BMI 23.62 kg/m2 Patient is very pleasant and in no acute distress. Skin is warm and dry. Color is normal.  HEENT is unremarkable. Normocephalic/atraumatic. PERRL. Sclera are nonicteric. Neck is supple. No masses. No JVD. Lungs are clear. Cardiac exam shows an irregular rate and rhythm. Normal S1 and S2. A999333 apical systolic murmur. Abdomen is soft. Extremities are without edema. Gait and ROM are intact. No gross neurologic deficits noted.   LABORATORY DATA:  Reviewed from Dr. Joylene Draft this month. TFTs normal. Chemistries OK.  Assessment / Plan: 1. Atrial fibrillation. He has chronic Afib.  Not a candidate for anticoagulation due to major GI bleed history. Rate is well controlled and he is minimally symptomatic.Continue amiodarone 100 mg daily and metoprolol. Since he is not a candidate for anticoagulation would not recommend cardioversion or ablation.  2. Ischemic cardiomyopathy status post ICD implant. He is well compensated today without significant edema and his weight is stable from last August. We will continue with  Lasix at current dose three times a week. He may take extra lasix if weight or edema increases  3. Coronary disease with old inferiolateral myocardial infarction. Status post CABG in 2002. Currently asymptomatic. Continue metoprolol. He is not a candidate for antiplatelet or anticoagulant therapy because of recurrent GI bleeding.  4. Hypertension, controlled.  5. Hypercholesterolemia.

## 2015-05-11 ENCOUNTER — Ambulatory Visit (INDEPENDENT_AMBULATORY_CARE_PROVIDER_SITE_OTHER): Payer: Medicare PPO | Admitting: *Deleted

## 2015-05-11 DIAGNOSIS — I5022 Chronic systolic (congestive) heart failure: Secondary | ICD-10-CM

## 2015-05-11 DIAGNOSIS — Z95810 Presence of automatic (implantable) cardiac defibrillator: Secondary | ICD-10-CM

## 2015-05-13 ENCOUNTER — Encounter: Payer: Self-pay | Admitting: *Deleted

## 2015-05-13 NOTE — Progress Notes (Signed)
EPIC Encounter for ICM Monitoring  Patient Name: [name] [name] is a 81 y.o. male Date: 05/13/2015 Primary Care Physican: Jerlyn Ly, MD Primary Cardiologist: [name] Electrophysiologist: Allred Dry Weight: 160 lbs       In the past month, have you:  1. Gained more than 2 pounds in a day or more than 5 pounds in a week? no  2. Had changes in your medications (with verification of current medications)? no  3. Had more shortness of breath than is usual for you? no  4. Limited your activity because of shortness of breath? no  5. Not been able to sleep because of shortness of breath? no  6. Had increased swelling in your feet or ankles? no  7. Had symptoms of dehydration (dizziness, dry mouth, increased thirst, decreased urine output) no  8. Had changes in sodium restriction? no  9. Been compliant with medication? Yes   ICM trend:   Follow-up plan: ICM clinic phone appointment: 06/15/15. This is my first ICM follow up with the patient in quite some time. His optivol readings were up in April and he was instructed to take additional lasix at that time. His readings have looked well since then. They do show some slight elevation over the last few days, however, his baseline weight is 160 lbs and he was 158 lbs today. He states he cannot tolerate lasix daily. He is currently taking lasix 20 mg once daily on MWF. He feels that if he takes more than that, he gets dehydrated quite easily. Since he is feeling well today and his weight is 2 lbs below baseline, no changes made today.   Copy of note sent to patient's primary care physician, primary cardiologist, and device following physician.  Alvis Lemmings, RN, BSN 05/13/2015 2:48 PM

## 2015-05-13 NOTE — Addendum Note (Signed)
Addended by: Alvis Lemmings C on: 05/13/2015 04:33 PM   Modules accepted: Orders, Medications

## 2015-05-22 ENCOUNTER — Encounter: Payer: Self-pay | Admitting: Internal Medicine

## 2015-06-15 ENCOUNTER — Ambulatory Visit (INDEPENDENT_AMBULATORY_CARE_PROVIDER_SITE_OTHER): Payer: Medicare PPO | Admitting: *Deleted

## 2015-06-15 ENCOUNTER — Telehealth: Payer: Self-pay | Admitting: Internal Medicine

## 2015-06-15 DIAGNOSIS — I5022 Chronic systolic (congestive) heart failure: Secondary | ICD-10-CM

## 2015-06-15 DIAGNOSIS — Z95810 Presence of automatic (implantable) cardiac defibrillator: Secondary | ICD-10-CM

## 2015-06-15 NOTE — Telephone Encounter (Signed)
Notified patient that we did receive transmission.

## 2015-06-15 NOTE — Telephone Encounter (Signed)
New message      Did you get patient's remote transmission?

## 2015-06-18 ENCOUNTER — Encounter: Payer: Self-pay | Admitting: *Deleted

## 2015-06-18 NOTE — Progress Notes (Signed)
EPIC Encounter for ICM Monitoring  Patient Name: [name] [name] is a 81 y.o. male Date: 06/18/2015 Primary Care Physican: Jerlyn Ly, MD Primary Cardiologist: [name] Electrophysiologist: Allred Dry Weight: 160 lbs       In the past month, have you:  1. Gained more than 2 pounds in a day or more than 5 pounds in a week? No. The patient states his weight will flucuate from 157-160 lbs.   2. Had changes in your medications (with verification of current medications)? no  3. Had more shortness of breath than is usual for you? no  4. Limited your activity because of shortness of breath? no  5. Not been able to sleep because of shortness of breath? no  6. Had increased swelling in your feet or ankles? no  7. Had symptoms of dehydration (dizziness, dry mouth, increased thirst, decreased urine output) no  8. Had changes in sodium restriction? no  9. Been compliant with medication? Yes   ICM trend:   Follow-up plan: ICM clinic phone appointment: 07/20/15. The patient's optivol trends have been elevated from ~ 5/30 - present. Impedence is slightly below baseline, however the patient states his weights have been stable from 157 lbs- 160 lbs. He is reporting no symptoms over the last few weeks. He is currently on lasix 20 mg once daily on MWF. He states that if he takes more, he will start losing weight and is cautious to do this. I advised I would forward to Dr. [name] to review and will contact him with any recommendations. He is agreeable.   Copy of note sent to patient's primary care physician, primary cardiologist, and device following physician.  Alvis Lemmings, RN, BSN 06/18/2015 12:37 PM

## 2015-06-22 ENCOUNTER — Other Ambulatory Visit: Payer: Self-pay

## 2015-06-22 ENCOUNTER — Other Ambulatory Visit: Payer: Self-pay | Admitting: Cardiology

## 2015-06-22 MED ORDER — METOPROLOL TARTRATE 50 MG PO TABS: 50.0000 mg | ORAL_TABLET | Freq: Two times a day (BID) | ORAL | Status: DC

## 2015-06-23 ENCOUNTER — Encounter: Payer: Self-pay | Admitting: *Deleted

## 2015-07-10 NOTE — Progress Notes (Signed)
Did you hear back from [name] on this?        Optivol did the exact same thing last year and he saw [place] with SOB in Aug. Quickly corrected with increased lasix. Repeat optivol now and see if continues to have lowering of impendance. If so, would increase lasix for a week.                ----- Message -----     From: Emily Filbert, RN     Sent: 06/18/2015 12:42 PM      To: [name], MD

## 2015-07-20 ENCOUNTER — Ambulatory Visit (INDEPENDENT_AMBULATORY_CARE_PROVIDER_SITE_OTHER): Payer: Medicare PPO | Admitting: *Deleted

## 2015-07-20 DIAGNOSIS — I5022 Chronic systolic (congestive) heart failure: Secondary | ICD-10-CM | POA: Diagnosis not present

## 2015-07-20 DIAGNOSIS — Z95810 Presence of automatic (implantable) cardiac defibrillator: Secondary | ICD-10-CM | POA: Diagnosis not present

## 2015-07-20 NOTE — Progress Notes (Addendum)
EPIC Encounter for ICM Monitoring  Patient Name: [name] [name] is a 81 y.o. male Date: 07/20/2015 Primary Care Physican: Jerlyn Ly, MD Primary Cardiologist: [name] Electrophysiologist: Allred Dry Weight: 160 lbs       In the past month, have you:  1. Gained more than 2 pounds in a day or more than 5 pounds in a week? no  2. Had changes in your medications (with verification of current medications)? no  3. Had more shortness of breath than is usual for you? no  4. Limited your activity because of shortness of breath? no  5. Not been able to sleep because of shortness of breath? no  6. Had increased swelling in your feet or ankles? no  7. Had symptoms of dehydration (dizziness, dry mouth, increased thirst, decreased urine output) no  8. Had changes in sodium restriction? no  9. Been compliant with medication? Yes   ICM trend:   Follow-up plan: ICM clinic phone appointment on 09/18/2015, patient has office visit with Dr Rayann Heman 08/26/2015.   Optivol fluid index is above baseline and impedance continues to be below baseline from ~07/11/2015 to present.  He denies any weight gain, ankles swell slightly during the day but resolve after lying down at night, he has some shortness of breath but reported it is not any worse than usual.  He currently takes Lasix 20 mg Monday, Wednesday and Friday and does not like to take more than that because it causes him to lose so much weight.  Advised to take additional Lasix 20 mg 1/2 tablet x 2 days for this week only to help improve symptoms shortness of breath, and ankle swelling.  Explained that would be 20 mg everyday this week and next week resume his prescribed dosage of 20 mg 3 days a week, Mon, Wed, and Friday.  Requested to send manual transmission on 08/03/2015 for review.  Instructed him to call for any worsening or new symptoms.         Forward message to Dr [name] and Dr Rayann Heman regarding patient symptoms of shortness of breath, ankle  swelling and recommended he take additional Lasix 20 mg x 2 days this week and resume prescribed dosage next week.   Copy of note sent to patient's primary care physician, primary cardiologist, and device following physician.  Rosalene Billings, RN, CCM 07/20/2015 3:39 PM   Agreed         Thanks!        ----- Message -----     From: Rosalene Billings, RN     Sent: 07/20/2015  4:10 PM      To: [name], MD

## 2015-07-26 ENCOUNTER — Other Ambulatory Visit: Payer: Self-pay | Admitting: Cardiology

## 2015-08-04 ENCOUNTER — Ambulatory Visit (INDEPENDENT_AMBULATORY_CARE_PROVIDER_SITE_OTHER): Payer: Medicare PPO

## 2015-08-04 ENCOUNTER — Telehealth: Payer: Self-pay

## 2015-08-04 DIAGNOSIS — I5022 Chronic systolic (congestive) heart failure: Secondary | ICD-10-CM

## 2015-08-04 DIAGNOSIS — Z95810 Presence of automatic (implantable) cardiac defibrillator: Secondary | ICD-10-CM

## 2015-08-04 NOTE — Progress Notes (Signed)
EPIC Encounter for ICM Monitoring  Patient Name: [name] [name] is a 81 y.o. male Date: 08/04/2015 Primary Care Physican: Jerlyn Ly, MD Primary Cardiologist: [name] Electrophysiologist: Allred Dry Weight: 148 lbs       In the past month, have you:  1. Gained more than 2 pounds in a day or more than 5 pounds in a week? no  2. Had changes in your medications (with verification of current medications)? no  3. Had more shortness of breath than is usual for you? no  4. Limited your activity because of shortness of breath? no  5. Not been able to sleep because of shortness of breath? no  6. Had increased swelling in your feet or ankles? no  7. Had symptoms of dehydration (dizziness, dry mouth, increased thirst, decreased urine output) no  8. Had changes in sodium restriction? no  9. Been compliant with medication? Yes   ICM trend:   Follow-up plan: ICM clinic phone appointment 09/28/2015.  Optivol fluid index and impedance below baseline ~07/21/2015 to 07/24/2015 and trending back along baseline 07/25/2015 to 08/03/2015.  Patient reported he feels fine and lost weight with 2 additional Lasix dosage 40mg  1/2 tablet week of 07/20/2015 to 07/24/2015 and returned to prescribed dosage week of 07/27/2015 which is Lasix 40mg  1/2 tablet Monday, Wednesday and Friday.  He said with taking just 2 additional dosages he lost from 160lbs to 148lbs and is eating well.  He reported his dry weight is 160lbs.  He has appointment with Dr Rayann Heman 08/26/2015 and encouraged him to discuss Lasix and additional wt loss.  He stated he eats out a couple of times a week and education given 1 meal in a restaurant,can have as much as >1500mg  of sodium which can increase fluid retention.     Copy of note sent to patient's primary care physician, primary cardiologist, and device following physician.  Rosalene Billings, RN, CCM 08/04/2015 10:15 AM

## 2015-08-04 NOTE — Telephone Encounter (Signed)
ICM transmission received.  Attempted call to patient and left message for return call. 

## 2015-08-04 NOTE — Telephone Encounter (Signed)
Spoke with patient.

## 2015-08-26 ENCOUNTER — Ambulatory Visit (INDEPENDENT_AMBULATORY_CARE_PROVIDER_SITE_OTHER): Payer: Medicare PPO | Admitting: Internal Medicine

## 2015-08-26 ENCOUNTER — Encounter: Payer: Self-pay | Admitting: Internal Medicine

## 2015-08-26 VITALS — BP 120/70 | HR 60 | Ht 71.0 in | Wt 157.4 lb

## 2015-08-26 DIAGNOSIS — Z95810 Presence of automatic (implantable) cardiac defibrillator: Secondary | ICD-10-CM

## 2015-08-26 DIAGNOSIS — I5021 Acute systolic (congestive) heart failure: Secondary | ICD-10-CM | POA: Diagnosis not present

## 2015-08-26 DIAGNOSIS — Z8719 Personal history of other diseases of the digestive system: Secondary | ICD-10-CM

## 2015-08-26 DIAGNOSIS — I2510 Atherosclerotic heart disease of native coronary artery without angina pectoris: Secondary | ICD-10-CM | POA: Diagnosis not present

## 2015-08-26 DIAGNOSIS — I255 Ischemic cardiomyopathy: Secondary | ICD-10-CM

## 2015-08-26 DIAGNOSIS — I5022 Chronic systolic (congestive) heart failure: Secondary | ICD-10-CM | POA: Diagnosis not present

## 2015-08-26 DIAGNOSIS — I482 Chronic atrial fibrillation: Secondary | ICD-10-CM | POA: Diagnosis not present

## 2015-08-26 DIAGNOSIS — I4821 Permanent atrial fibrillation: Secondary | ICD-10-CM

## 2015-08-26 LAB — CUP PACEART INCLINIC DEVICE CHECK
Brady Statistic AP VP Percent: 0.2 %
Brady Statistic AP VS Percent: 0.05 %
Brady Statistic AS VP Percent: 11.18 %
Brady Statistic RA Percent Paced: 0.25 %
Brady Statistic RV Percent Paced: 11.39 %
Date Time Interrogation Session: 20160928144814
HIGH POWER IMPEDANCE MEASURED VALUE: 37 Ohm
HIGH POWER IMPEDANCE MEASURED VALUE: 47 Ohm
Lead Channel Impedance Value: 544 Ohm
Lead Channel Pacing Threshold Amplitude: 1 V
Lead Channel Sensing Intrinsic Amplitude: 1.0264
Lead Channel Sensing Intrinsic Amplitude: 19.6648
Lead Channel Setting Pacing Amplitude: 2.5 V
Lead Channel Setting Pacing Pulse Width: 0.4 ms
Lead Channel Setting Sensing Sensitivity: 0.3 mV
MDC IDC MSMT BATTERY VOLTAGE: 2.62 V
MDC IDC MSMT LEADCHNL RV IMPEDANCE VALUE: 416 Ohm
MDC IDC MSMT LEADCHNL RV PACING THRESHOLD PULSEWIDTH: 0.4 ms
MDC IDC SET ZONE DETECTION INTERVAL: 350 ms
MDC IDC STAT BRADY AS VS PERCENT: 88.57 %
Zone Setting Detection Interval: 270 ms
Zone Setting Detection Interval: 280 ms
Zone Setting Detection Interval: 370 ms
Zone Setting Detection Interval: 400 ms

## 2015-08-26 NOTE — Progress Notes (Signed)
PCP:  Jerlyn Ly, MD Primary Cardiologist:  Dr [name]  The patient presents today for routine electrophysiology followup.  The patient is presently doing well.   Today, he denies symptoms of palpitations, chest pain, shortness of breath,  lower extremity edema, dizziness, presyncope, syncope, or ICD shocks.  The patient feels that he is tolerating medications without difficulties and is otherwise without complaint today.   Past Medical History  Diagnosis Date  . Colon cancer     s/p chemo, RT, sugery  . Colorectal anastomotic stricture   . Cardiomyopathy     s/p ICD 09/30/08; EF 35 to 40% per echo in October 2021  . Iron deficiency anemia   . Diverticulosis   . ICD (implantable cardiac defibrillator) discharge October 2012    ICD shock for afib with RVR  . ED (erectile dysfunction)   . CAD (coronary artery disease)     Old inferolateral MI s/p CABG in 2002  . HTN (hypertension)   . Paroxysmal atrial fibrillation     controlled with amiodarone, not a coumadin candidate due to GI bleeding  . GERD (gastroesophageal reflux disease)     rarely  . CAP (community acquired pneumonia) 10/03/2011  . Chronic systolic CHF (congestive heart failure) 07/24/2013   Past Surgical History  Procedure Laterality Date  . Vasectomy    . Colon resection      x2  . Coronary artery bypass graft    . Cholecystectomy    . Back surgery    . Hernia repair    . Cardiac defibrillator placement  09/30/08    MDT dual chamber ICD placed by Dr Rayann Heman  . Cardiac catheterization  11/07/2000    EF 32%  . US echocardiography  08/11/2008    EF 30-35%  . Cardiovascular stress test  08/06/2008    EF 30%  . Appendectomy      Current Outpatient Prescriptions  Medication Sig Dispense Refill  . cholecalciferol (VITAMIN D) 1000 UNITS tablet Take 1,000 Units by mouth daily.     Marland Kitchen dextromethorphan-guaiFENesin (MUCINEX DM) 30-600 MG per 12 hr tablet Take 1 tablet by mouth 2 (two) times daily as needed for cough. 40  tablet 0  . digoxin (LANOXIN) 0.125 MG tablet Take 0.5 tablets (62.5 mcg total) by mouth daily. 45 tablet 3  . diphenoxylate-atropine (LOMOTIL) 2.5-0.025 MG per tablet Take 2 tablets by mouth daily. For diarrhea     . fluorouracil (EFUDEX) 5 % cream Apply 1 application topically 2 (two) times daily.    . furosemide (LASIX) 40 MG tablet Take 1/2 tablet (20 mg) by mouth once daily on Monday, Wednesday, and Friday    . metoprolol (LOPRESSOR) 50 MG tablet Take 1 tablet (50 mg total) by mouth 2 (two) times daily. 180 tablet 3  . Multiple Vitamins-Minerals (MULTIVITAMINS THER. W/MINERALS) TABS Take 1 tablet by mouth daily.      . nitroGLYCERIN (NITROSTAT) 0.4 MG SL tablet Place 1 tablet (0.4 mg total) under the tongue every 5 (five) minutes as needed for chest pain. 25 tablet 6  . potassium chloride SA (K-DUR,KLOR-CON) 20 MEQ tablet Take 20 mEq by mouth 3 (three) times a week.     Marland Kitchen PROAIR HFA 108 (90 BASE) MCG/ACT inhaler Inhale 2 puffs into the lungs every 4 (four) hours as needed for shortness of breath.     . psyllium (REGULOID) 0.52 G capsule Take 2 capsules by mouth daily.     . ramipril (ALTACE) 10 MG capsule Take 1 capsule by  mouth daily.    . rosuvastatin (CRESTOR) 5 MG tablet Take 5 mg by mouth 2 (two) times a week.    . temazepam (RESTORIL) 7.5 MG capsule Take 1 capsule (7.5 mg total) by mouth at bedtime as needed for sleep. 30 capsule 0  . vitamin B-12 (CYANOCOBALAMIN) 1000 MCG tablet Take 1,000 mcg by mouth daily.     No current facility-administered medications for this visit.    Allergies  Allergen Reactions  . Aspirin     Bleeding   . Iron     Stomach issues  . Lescol [Fluvastatin Sodium]     Dizziness  . Pentazocine Lactate     unknown  . Vytorin [Ezetimibe-Simvastatin]     Leg cramps     Social History   Social History  . Marital Status: Married    Spouse Name: N/A  . Number of Children: 2  . Years of Education: N/A   Occupational History  . Retired      Social History Main Topics  . Smoking status: Former Smoker    Types: Cigars  . Smokeless tobacco: Never Used  . Alcohol Use: No  . Drug Use: No  . Sexual Activity: Not on file   Other Topics Concern  . Not on file   Social History Narrative   Lives in [place] with spouse.  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 Colon cancer Father   . Diabetes Sister   . Colon polyps Brother   . Pancreatic cancer Brother 30    ROS-  All systems are reviewed and are negative except as outlined in the HPI above   Physical Exam: Filed Vitals:   08/26/15 1124  BP: 120/70  Pulse: 60  Height: 5\' 11"  (1.803 m)  Weight: 157 lb 6.4 oz (71.396 kg)    GEN- The patient is well appearing, alert and oriented x 3 today.   Head- normocephalic, atraumatic Eyes-  Sclera clear, conjunctiva pink Ears- hearing intact Oropharynx- clear Neck- supple,  Lungs- Clear to ausculation bilaterally, normal work of breathing Chest- ICD pocket is well healed Heart- Regular rate and rhythm, no murmurs, rubs or gallops, PMI not laterally displaced GI- soft, NT, ND, + BS Extremities- no clubbing, cyanosis, or edema  ICD interrogation- reviewed in detail today,  See PACEART report  Assessment and Plan:  1. Chronic systolic dysfunction The patient appears euvolemic today His ICD is approaching ERI.  We had along discussion regarding options of replacing with an ICD, replacing with a pacemaker, or not replacing the device at time of gen change.  He does V pace 11% and thus I think that he may benefit from pacing backup.  He has had ICD shocks for AF with RVR previously but not ventricular arrhythmias.  His EF is 35-40% and he has advanced age.  After a very long and thorough conversation, the patient is clear that he would like to have generator changed to a pacemaker once he  reaches ERI.  I think that this is very appropriate.  I will likely switch to [facility] to allow him to continue with wireless remote monitoring.  He will need monthly battery checks at time of his ICM device remotes.  Once he reaches ERI, he can be scheduled for the procedure without another  office visit.  2. Permanent atrial fibrillation Stop amiodarone Not a candidate for anticoagualtion Could consider left atrial appendage closure in the future  3. CAD No ischemic symptoms  Monthly ICM interrogations Return to see me in 1 year if he does not reach ERI in the interim.

## 2015-08-26 NOTE — Patient Instructions (Addendum)
Medication Instructions:  Your physician has recommended you make the following change in your medication:  1) Stop Amiodarone    Labwork: None ordered   Testing/Procedures: None ordered   Follow-Up: Your physician wants you to follow-up in: 6 months with Dr Rayann Heman Dennis Bast will receive a reminder letter in the mail two months in advance. If you don't receive a letter, please call our office to schedule the follow-up appointment.   Remote monitoring is used to monitor your ICD from home. This monitoring reduces the number of office visits required to check your device to one time per year. It allows [place] to keep an eye on the functioning of your device to ensure it is working properly. You are scheduled for a device check from home on 09-28-2015. You may send your transmission at any time that day. If you have a wireless device, the transmission will be sent automatically. After your physician reviews your transmission, you will receive a postcard with your next transmission date.        Any Other Special Instructions Will Be Listed Below (If Applicable).

## 2015-08-28 ENCOUNTER — Other Ambulatory Visit: Payer: Self-pay | Admitting: Cardiology

## 2015-09-15 ENCOUNTER — Encounter: Payer: Self-pay | Admitting: Internal Medicine

## 2015-09-28 ENCOUNTER — Ambulatory Visit (INDEPENDENT_AMBULATORY_CARE_PROVIDER_SITE_OTHER): Payer: Medicare PPO

## 2015-09-28 DIAGNOSIS — I5021 Acute systolic (congestive) heart failure: Secondary | ICD-10-CM | POA: Diagnosis not present

## 2015-09-28 DIAGNOSIS — Z95810 Presence of automatic (implantable) cardiac defibrillator: Secondary | ICD-10-CM | POA: Diagnosis not present

## 2015-09-28 NOTE — Progress Notes (Addendum)
EPIC Encounter for ICM Monitoring  Patient Name: [name] [name] is a 81 y.o. male Date: 09/28/2015 Primary Care Physican: Jerlyn Ly, MD Primary Cardiologist: [name] Electrophysiologist: Allred  Dry Weight: 155 lb       In the past month, have you:  1. Gained more than 2 pounds in a day or more than 5 pounds in a week? no  2. Had changes in your medications (with verification of current medications)? no  3. Had more shortness of breath than is usual for you? Yes, occasionally  4. Limited your activity because of shortness of breath? no  5. Not been able to sleep because of shortness of breath? no  6. Had increased swelling in your feet or ankles? Yes, but does resolve after elevation.   7. Had symptoms of dehydration (dizziness, dry mouth, increased thirst, decreased urine output) no  8. Had changes in sodium restriction? no  9. Been compliant with medication? No, he occasionally misses dosages if traveling.     ICM trend: 09/28/2015   Follow-up plan: ICM clinic phone appointment on 10/05/2015 (repeat) and office appointment with Dr Rayann Heman 10/28/2015 to discuss device battery replacement.  Optivol fluid index above baseline since 08/20/2015 and impedance below baseline since 08/20/2015 except 4 days at baseline.  Patient reported he has had some SOB but not consistently.  Denied any recent weight gain.  His prescribed Furosemide dosage is 40mg  - 1/2 tablet (20mg ) Monday, Wednesday and Friday because taking it daily on regular basis causes him to lose to much weight.   Recommended to take Furosemide 40mg  - 1/2 tablet (20mg ) and Potassium 20 meq - 1 tablet on Tuesday and Thursday in addition to his Monday, Wednesday and Friday dosages.  Then starting, 10/05/2015, he will resume Potassium 20 meq 1 tablet and Furosemide 40mg  3 days a week as prescribed. Advised to call should he have any problems with taking extra Lasix such as nausea, vomiting, weakness, or muscle cramps.   Advised  will forward information for review to Dr [name] and Dr Rayann Heman and will call back for any further recommendations.    Copy of note sent to patient's primary care physician, primary cardiologist, and device following physician.  Rosalene Billings, RN, CCM 09/28/2015 1:58 PM   Agreed         ----- Message -----     From: Rosalene Billings, RN     Sent: 09/28/2015  2:19 PM      To: [name], MD

## 2015-10-05 ENCOUNTER — Telehealth: Payer: Self-pay | Admitting: *Deleted

## 2015-10-05 ENCOUNTER — Telehealth: Payer: Self-pay | Admitting: Internal Medicine

## 2015-10-05 ENCOUNTER — Ambulatory Visit (INDEPENDENT_AMBULATORY_CARE_PROVIDER_SITE_OTHER): Payer: Medicare PPO

## 2015-10-05 DIAGNOSIS — Z95810 Presence of automatic (implantable) cardiac defibrillator: Secondary | ICD-10-CM

## 2015-10-05 DIAGNOSIS — I5021 Acute systolic (congestive) heart failure: Secondary | ICD-10-CM

## 2015-10-05 NOTE — Telephone Encounter (Signed)
F/u    Pt stating he is returning [facility] phone call.

## 2015-10-05 NOTE — Telephone Encounter (Signed)
LMOM that Dr. Jackalyn Lombard nurse would be in touch with him to schedule generator replacement for his ICD. Advised he could call back if he had further questions.   Message sent to Janan Halter, RN.

## 2015-10-06 NOTE — Telephone Encounter (Signed)
Spoke with patient and he is aware to be at the hospital on 11/17/156 at 12:30pm.  He can have a lite breakfast but NPO after 8am.  He will have his labs done at the hospital.  He knows to report to the [facility] [employer] at 12:30. He currently has a MDT ICD and is going to be downgraded to a S[place] PPM per Dr Jackalyn Lombard notes

## 2015-10-06 NOTE — Progress Notes (Addendum)
EPIC Encounter for ICM Monitoring  Patient Name: [name] [name] is a 81 y.o. male Date: 10/06/2015 Primary Care Physican: Jerlyn Ly, MD Primary Cardiologist: [name] Electrophysiologist: Allred Dry Weight: 156 lb       In the past month, have you:  1. Gained more than 2 pounds in a day or more than 5 pounds in a week? no  2. Had changes in your medications (with verification of current medications)? no  3. Had more shortness of breath than is usual for you? no  4. Limited your activity because of shortness of breath? no  5. Not been able to sleep because of shortness of breath? no  6. Had increased swelling in your feet or ankles? no  7. Had symptoms of dehydration (dizziness, dry mouth, increased thirst, decreased urine output) no  8. Had changes in sodium restriction? no  9. Been compliant with medication? Yes   ICM trend:  10/05/2015   Follow-up plan: ICM clinic phone appointment on 12/07/2015.  Device change is scheduled for 10/15/2015 and advise patient will follow up in January 2017 to resume fluid readings.  He reported he may be getting a pacemaker instead of defibrillator.  Optivol thoracic impedance trended back to baseline on 09/29/2015 after taking additional dosages of Furosemide last week.  Thoracic impedance below baseline starting ~09/30/2015 to 10/05/2015 (date of transmission) suggesting fluid retention.  He stated he is feeling fine today.  No changes today but reminded patient to follow fluid intake and low sodium diet.    Copy of note sent to patient's primary care physician, primary cardiologist, and device following physician.  Rosalene Billings, RN, CCM 10/06/2015 11:32 AM   Agreed         ----- Message -----     From: Rosalene Billings, RN     Sent: 10/06/2015 11:40 AM      To: [name], MD

## 2015-10-14 ENCOUNTER — Telehealth: Payer: Self-pay

## 2015-10-14 NOTE — Telephone Encounter (Signed)
Call to patient.  Advised I spoke with Dr Jackalyn Lombard nurse, and she instructed him to take his prescribed medications in the morning.  He asked about taking vitamins and I stated only take the prescribed meds.  He verbalized understanding.   Received voice mail from patient inquiring if he should take his am meds tomorrow before his procedure.  He requested a call back.  Confirmed with Dr Jackalyn Lombard nurse, Janan Halter, RN that patient should take morning meds.

## 2015-10-15 ENCOUNTER — Ambulatory Visit (HOSPITAL_COMMUNITY)
Admission: RE | Admit: 2015-10-15 | Discharge: 2015-10-15 | Disposition: A | Discharge status: Home / Self Care | Payer: Medicare PPO | Source: Ambulatory Visit | Attending: Internal Medicine | Admitting: Internal Medicine

## 2015-10-15 ENCOUNTER — Encounter (HOSPITAL_COMMUNITY)
Admission: RE | Disposition: A | Discharge status: Home / Self Care | Payer: Self-pay | Source: Ambulatory Visit | Attending: Internal Medicine

## 2015-10-15 DIAGNOSIS — D509 Iron deficiency anemia, unspecified: Secondary | ICD-10-CM | POA: Insufficient documentation

## 2015-10-15 DIAGNOSIS — E785 Hyperlipidemia, unspecified: Secondary | ICD-10-CM | POA: Diagnosis not present

## 2015-10-15 DIAGNOSIS — Z87891 Personal history of nicotine dependence: Secondary | ICD-10-CM | POA: Diagnosis not present

## 2015-10-15 DIAGNOSIS — I482 Chronic atrial fibrillation: Secondary | ICD-10-CM | POA: Diagnosis not present

## 2015-10-15 DIAGNOSIS — I255 Ischemic cardiomyopathy: Secondary | ICD-10-CM | POA: Diagnosis not present

## 2015-10-15 DIAGNOSIS — Z85038 Personal history of other malignant neoplasm of large intestine: Secondary | ICD-10-CM | POA: Insufficient documentation

## 2015-10-15 DIAGNOSIS — K219 Gastro-esophageal reflux disease without esophagitis: Secondary | ICD-10-CM | POA: Diagnosis not present

## 2015-10-15 DIAGNOSIS — I110 Hypertensive heart disease with heart failure: Secondary | ICD-10-CM | POA: Diagnosis not present

## 2015-10-15 DIAGNOSIS — I5022 Chronic systolic (congestive) heart failure: Secondary | ICD-10-CM | POA: Diagnosis present

## 2015-10-15 DIAGNOSIS — I252 Old myocardial infarction: Secondary | ICD-10-CM | POA: Diagnosis not present

## 2015-10-15 DIAGNOSIS — I2510 Atherosclerotic heart disease of native coronary artery without angina pectoris: Secondary | ICD-10-CM | POA: Diagnosis not present

## 2015-10-15 DIAGNOSIS — I495 Sick sinus syndrome: Secondary | ICD-10-CM | POA: Diagnosis not present

## 2015-10-15 DIAGNOSIS — Z951 Presence of aortocoronary bypass graft: Secondary | ICD-10-CM | POA: Insufficient documentation

## 2015-10-15 DIAGNOSIS — Z45010 Encounter for checking and testing of cardiac pacemaker pulse generator [battery]: Secondary | ICD-10-CM | POA: Insufficient documentation

## 2015-10-15 DIAGNOSIS — R001 Bradycardia, unspecified: Secondary | ICD-10-CM

## 2015-10-15 HISTORY — PX: EP IMPLANTABLE DEVICE: SHX172B

## 2015-10-15 LAB — BASIC METABOLIC PANEL
Anion gap: 7 (ref 5–15)
BUN: 16 mg/dL (ref 6–20)
CALCIUM: 9.1 mg/dL (ref 8.9–10.3)
CO2: 27 mmol/L (ref 22–32)
Chloride: 105 mmol/L (ref 101–111)
Creatinine, Ser: 1.19 mg/dL (ref 0.61–1.24)
GFR calc Af Amer: >60 mL/min (ref >60)
GFR, EST NON AFRICAN AMERICAN: 55 mL/min — AB (ref >60)
GLUCOSE: 90 mg/dL (ref 65–99)
Potassium: 4.3 mmol/L (ref 3.5–5.1)
SODIUM: 139 mmol/L (ref 135–145)

## 2015-10-15 LAB — CBC
HCT: 37.8 % — ABNORMAL LOW (ref 39.0–52.0)
Hemoglobin: 12.1 g/dL — ABNORMAL LOW (ref 13.0–17.0)
MCH: 30.7 pg (ref 26.0–34.0)
MCHC: 32 g/dL (ref 30.0–36.0)
MCV: 95.9 fL (ref 78.0–100.0)
PLATELETS: 160 10*3/uL (ref 150–400)
RBC: 3.94 MIL/uL — ABNORMAL LOW (ref 4.22–5.81)
RDW: 16.3 % — AB (ref 11.5–15.5)
WBC: 6.2 10*3/uL (ref 4.0–10.5)

## 2015-10-15 LAB — SURGICAL PCR SCREEN
MRSA, PCR: NEGATIVE
STAPHYLOCOCCUS AUREUS: NEGATIVE

## 2015-10-15 SURGERY — PPM/BIV PPM GENERATOR CHANGEOUT

## 2015-10-15 MED ORDER — SODIUM CHLORIDE 0.9 % IJ SOLN: 3.0000 mL | INTRAMUSCULAR | Status: DC | PRN

## 2015-10-15 MED ORDER — SODIUM CHLORIDE 0.9 % IJ SOLN: 3.0000 mL | Freq: Two times a day (BID) | INTRAMUSCULAR | Status: DC

## 2015-10-15 MED ORDER — CHLORHEXIDINE GLUCONATE 4 % EX LIQD: 60.0000 mL | Freq: Once | CUTANEOUS | Status: DC

## 2015-10-15 MED ORDER — SODIUM CHLORIDE 0.9 % IR SOLN
80.0000 mg | Status: AC
  Administered 2015-10-15: 80 mg

## 2015-10-15 MED ORDER — ONDANSETRON HCL 4 MG/2ML IJ SOLN
INTRAMUSCULAR | Status: AC
  Filled 2015-10-15: qty 2

## 2015-10-15 MED ORDER — LIDOCAINE HCL (PF) 1 % IJ SOLN
INTRAMUSCULAR | Status: DC | PRN
  Administered 2015-10-15: 35 mL via INTRADERMAL

## 2015-10-15 MED ORDER — ACETAMINOPHEN 325 MG PO TABS: 325.0000 mg | ORAL_TABLET | ORAL | Status: DC | PRN

## 2015-10-15 MED ORDER — LIDOCAINE HCL (PF) 1 % IJ SOLN
INTRAMUSCULAR | Status: AC
  Filled 2015-10-15: qty 30

## 2015-10-15 MED ORDER — ONDANSETRON HCL 4 MG/2ML IJ SOLN
4.0000 mg | Freq: Four times a day (QID) | INTRAMUSCULAR | Status: DC | PRN
  Administered 2015-10-15: 4 mg via INTRAVENOUS

## 2015-10-15 MED ORDER — SODIUM CHLORIDE 0.9 % IV SOLN: 250.0000 mL | INTRAVENOUS | Status: DC | PRN

## 2015-10-15 MED ORDER — CEFAZOLIN SODIUM-DEXTROSE 2-3 GM-% IV SOLR
INTRAVENOUS | Status: AC
  Filled 2015-10-15: qty 50

## 2015-10-15 MED ORDER — CEFAZOLIN SODIUM-DEXTROSE 2-3 GM-% IV SOLR
2.0000 g | INTRAVENOUS | Status: AC
  Administered 2015-10-15: 2 g via INTRAVENOUS

## 2015-10-15 MED ORDER — SODIUM CHLORIDE 0.9 % IV SOLN
INTRAVENOUS | Status: DC
  Administered 2015-10-15: 11:00:00 via INTRAVENOUS

## 2015-10-15 MED ORDER — ONDANSETRON HCL 4 MG/2ML IJ SOLN
INTRAMUSCULAR | Status: DC | PRN
  Administered 2015-10-15: 4 mg via INTRAVENOUS

## 2015-10-15 MED ORDER — SODIUM CHLORIDE 0.9 % IR SOLN
Status: AC
  Filled 2015-10-15: qty 2

## 2015-10-15 MED ORDER — MUPIROCIN 2 % EX OINT
1.0000 "application " | TOPICAL_OINTMENT | Freq: Once | CUTANEOUS | Status: AC
  Administered 2015-10-15: 1 via TOPICAL

## 2015-10-15 MED ORDER — MUPIROCIN 2 % EX OINT
TOPICAL_OINTMENT | CUTANEOUS | Status: AC
  Administered 2015-10-15: 1 via TOPICAL
  Filled 2015-10-15: qty 22

## 2015-10-15 SURGICAL SUPPLY — 4 items
CABLE SURGICAL S-101-97-12 (CABLE) ×2 IMPLANT
ELECT DEFIB PAD ADLT CADENCE (PAD) ×2 IMPLANT
PPM ASSURITY SR PM1240 (Pacemaker) ×2 IMPLANT
TRAY PACEMAKER INSERTION (PACKS) ×2 IMPLANT

## 2015-10-15 NOTE — Discharge Instructions (Signed)

## 2015-10-15 NOTE — Progress Notes (Signed)
Arrived to holding area nauseated and heaving. Threw up orange-amber colored emesis. Dr. Rayann Heman by. OK to give another dose of Zofran IV. Tachycardiac. Dr. Rayann Heman aware.

## 2015-10-15 NOTE — Progress Notes (Signed)
Dr. Rayann Heman by to check on patient.

## 2015-10-15 NOTE — Interval H&P Note (Signed)
History and Physical Interval Note:  10/15/2015 12:28 PM  [name] [name]  has presented today for surgery, with the diagnosis of eri/afib  The various methods of treatment have been discussed with the patient and family. After consideration of risks, benefits and other options for treatment, the patient has consented to  Procedure(s):  PPM Generator Changeout (N/A) as a surgical intervention .  The patient's history has been reviewed, patient examined, no change in status, stable for surgery.  I have reviewed the patient's chart and labs.  Questions were answered to the patient's satisfaction.     [name]

## 2015-10-15 NOTE — Progress Notes (Signed)
Resting quietly. No further emesis in the last 10 minutes.

## 2015-10-15 NOTE — Progress Notes (Signed)
No further nausea. No c/o pain.

## 2015-10-15 NOTE — H&P (Signed)
H&P    Patient ID: [name] [name] MRN: [id number], DOB/AGE: 79-Apr-1933 79 y.o.  Admit date: 10/15/2015 Date of Consult: 10/15/2015  Primary Physician: Jerlyn Ly, MD Electrophysiologist: Dr. Rayann Heman Cardiologist: Dr. [name]   HPI: The patient presents today for elective ICD generator removal and replacement with PPM.   He has PMHx of ICM, CAD, HTN, hyperlipidemia, AFib not on anticoagulation with history of bleeding, no longer felt to be a candidate.  The patient is presently doing well. Today, he denies symptoms of palpitations, chest pain, shortness of breath, no lower extremity edema, dizziness, presyncope, syncope, or ICD shocks. The patient feels that he is tolerating medications without difficulties and is otherwise without complaint today. He denies any fever chills or recent illness.  Out patient the discussion with Dr. Rayann Heman and the patient regarding options of replacing with an ICD, replacing with a pacemaker, or not replacing the device at time of gen change. It was noted that he does V pace 11% and he may benefit from pacing backup. He has had ICD shocks for AF with RVR previously but not ventricular arrhythmias. His EF is 35-40% and he has advanced age. It was decided at that visit and the patient was clear that he would like to have generator changed to a pacemaker once it had reached ERI.   Past Medical History  Diagnosis Date  . Colon cancer     s/p chemo, RT, sugery  . Colorectal anastomotic stricture   . Cardiomyopathy     s/p ICD 09/30/08; EF 35 to 40% per echo in October 2021  . Iron deficiency anemia   . Diverticulosis   . ICD (implantable cardiac defibrillator) discharge October 2012    ICD shock for afib with RVR  . ED (erectile dysfunction)   . CAD (coronary artery disease)     Old inferolateral MI s/p CABG in 2002  . HTN (hypertension)   . Paroxysmal atrial fibrillation     controlled with amiodarone, not a coumadin candidate due to GI bleeding    . GERD (gastroesophageal reflux disease)     rarely  . CAP (community acquired pneumonia) 10/03/2011  . Chronic systolic CHF (congestive heart failure) 07/24/2013     Surgical History:  Past Surgical History  Procedure Laterality Date  . Vasectomy    . Colon resection      x2  . Coronary artery bypass graft    . Cholecystectomy    . Back surgery    . Hernia repair    . Cardiac defibrillator placement  09/30/08    MDT dual chamber ICD placed by Dr Rayann Heman  . Cardiac catheterization  11/07/2000    EF 32%  . US echocardiography  08/11/2008    EF 30-35%  . Cardiovascular stress test  08/06/2008    EF 30%  . Appendectomy       Prescriptions prior to admission  Medication Sig Dispense Refill Last Dose  . cholecalciferol (VITAMIN D) 1000 UNITS tablet Take 1,000 Units by mouth daily.    10/14/2015 at Unknown time  . digoxin (LANOXIN) 0.125 MG tablet Take 0.5 tablets (62.5 mcg total) by mouth daily. 45 tablet 3 10/15/2015 at 0800  . diphenoxylate-atropine (LOMOTIL) 2.5-0.025 MG per tablet Take 2 tablets by mouth daily. For diarrhea    10/14/2015 at Unknown time  . furosemide (LASIX) 40 MG tablet Take 1/2 tablet (20 mg) by mouth once daily on Monday, Wednesday, and Friday   10/14/2015 at Unknown time  . metoprolol (LOPRESSOR)  50 MG tablet Take 1 tablet (50 mg total) by mouth 2 (two) times daily. 180 tablet 3 10/15/2015 at 0800  . Multiple Vitamins-Minerals (MULTIVITAMINS THER. W/MINERALS) TABS Take 1 tablet by mouth daily.     10/14/2015 at Unknown time  . potassium chloride SA (K-DUR,KLOR-CON) 20 MEQ tablet Take 20 mEq by mouth 3 (three) times a week.    Past Week at Unknown time  . psyllium (REGULOID) 0.52 G capsule Take 2 capsules by mouth daily.    10/14/2015 at Unknown time  . ramipril (ALTACE) 10 MG capsule Take 1 capsule by mouth daily.   10/15/2015 at 0800  . rosuvastatin (CRESTOR) 5 MG tablet Take 5 mg by mouth 2 (two) times a week.   Past Week at Unknown time  . vitamin B-12  (CYANOCOBALAMIN) 1000 MCG tablet Take 1,000 mcg by mouth daily.   10/14/2015 at Unknown time  . fluorouracil (EFUDEX) 5 % cream Apply 1 application topically 2 (two) times daily.   More than a month at Unknown time  . nitroGLYCERIN (NITROSTAT) 0.4 MG SL tablet Place 1 tablet (0.4 mg total) under the tongue every 5 (five) minutes as needed for chest pain. 25 tablet 6 More than a month at Unknown time  . PROAIR HFA 108 (90 BASE) MCG/ACT inhaler Inhale 2 puffs into the lungs every 4 (four) hours as needed for shortness of breath.    More than a month at Unknown time  . temazepam (RESTORIL) 7.5 MG capsule Take 1 capsule (7.5 mg total) by mouth at bedtime as needed for sleep. 30 capsule 0 Unknown at Unknown time    Inpatient Medications:  .  ceFAZolin (ANCEF) IV  2 g Intravenous On Call  . chlorhexidine  60 mL Topical Once  . gentamicin irrigation  80 mg Irrigation On Call    Allergies:  Allergies  Allergen Reactions  . Aspirin     Bleeding   . Iron     Stomach issues  . Lescol [Fluvastatin Sodium]     Dizziness  . Pentazocine Lactate     unknown  . Vytorin [Ezetimibe-Simvastatin]     Leg cramps     Social History   Social History  . Marital Status: Married    Spouse Name: N/A  . Number of Children: 2  . Years of Education: N/A   Occupational History  . Retired     Social History Main Topics  . Smoking status: Former Smoker    Types: Cigars  . Smokeless tobacco: Never Used  . Alcohol Use: No  . Drug Use: No  . Sexual Activity: Not on file   Other Topics Concern  . Not on file   Social History Narrative   Lives in [place] with spouse.  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 Colon cancer Father   . Diabetes Sister   . Colon polyps Brother   . Pancreatic cancer Brother 63     Review of Systems: All other systems reviewed  and are otherwise negative except as noted above.  Physical Exam: Filed Vitals:   10/15/15 1002  BP: 115/85  Pulse: 80  Temp: 97.2 F (36.2 C)  TempSrc: Oral  Resp: 16  Height: 5\' 11"  (1.803 m)  Weight: 157 lb (71.215 kg)  SpO2: 100%  GEN- The patient is well appearing, alert and oriented x 3 today.   HEENT: normocephalic, atraumatic; sclera clear, conjunctiva pink; hearing intact; oropharynx clear; neck supple, no JVP Lymph- no cervical lymphadenopathy Lungs- Clear to ausculation bilaterally, normal work of breathing.  No wheezes, rales, rhonchi Heart- irregular rate and rhythm  GI- soft, non-tender, non-distended, bowel sounds present Extremities- no clubbing, cyanosis, or edema MS- no significant deformity or atrophy Skin- warm and dry, no rash or lesion Psych- euthymic mood, full affect Neuro- no gross deficits observed  Labs:   Lab Results  Component Value Date   WBC 6.2 10/15/2015   HGB 12.1* 10/15/2015   HCT 37.8* 10/15/2015   MCV 95.9 10/15/2015   PLT 160 10/15/2015    DEVICE HISTORY: MDT ICD implanted 2009  Assessment and Plan:  1. ICM, MDT ICD at [facility]     Planned for change of ICD to [facility] implant as noted in discussion above  2. Permanent atrial fibrillation Off of amiodarone Not a candidate for anticoagualtion Could consider left atrial appendage closure in the future, though given advanced age he may not be a candidate  3. CAD No ischemic symptoms   Signed, Tommye Standard, PA-C 10/15/2015 11:08 AM  I have seen, examined the patient, and reviewed the above assessment and plan. On exam, iRRR.  Changes to above are made where necessary.   Risks, benefits, and alternatives to pulse generator replacement were discussed in detail today.  Given advanced age, EF 40%, and no prior therapies, he and I agree that down grade to PPM are appropriate.  Given permanent afib, will plan single chamber PPM at gen change. The patient understands that risks include but  are not limited to bleeding, infection, pneumothorax, perforation, tamponade, vascular damage, renal failure, MI, stroke, death, damage to his existing leads, and lead dislodgement and wishes to proceed at this time.  Co Sign: [name], MD 10/15/2015 12:24 PM

## 2015-10-16 ENCOUNTER — Encounter: Payer: Self-pay | Admitting: Cardiology

## 2015-10-16 ENCOUNTER — Telehealth: Payer: Self-pay

## 2015-10-16 ENCOUNTER — Encounter (HOSPITAL_COMMUNITY): Payer: Self-pay | Admitting: Internal Medicine

## 2015-10-16 NOTE — Telephone Encounter (Signed)
Routed call to scheduler to set up appointments.

## 2015-10-16 NOTE — Telephone Encounter (Signed)
Received call from patient.  He reported he needs an appointment to follow up with Dr Rayann Heman after having his device changed.

## 2015-10-26 ENCOUNTER — Ambulatory Visit (INDEPENDENT_AMBULATORY_CARE_PROVIDER_SITE_OTHER): Payer: Medicare PPO | Admitting: *Deleted

## 2015-10-26 DIAGNOSIS — Z950 Presence of cardiac pacemaker: Secondary | ICD-10-CM

## 2015-10-26 DIAGNOSIS — I482 Chronic atrial fibrillation: Secondary | ICD-10-CM | POA: Diagnosis not present

## 2015-10-26 DIAGNOSIS — I4821 Permanent atrial fibrillation: Secondary | ICD-10-CM

## 2015-10-26 LAB — CUP PACEART INCLINIC DEVICE CHECK
Battery Remaining Longevity: 129.6
Battery Voltage: 3.1 V
Date Time Interrogation Session: 20161128122059
Implantable Lead Implant Date: 20091103
Implantable Lead Location: 753860
Implantable Lead Model: 5076
Lead Channel Impedance Value: 412.5 Ohm
Lead Channel Pacing Threshold Amplitude: 1 V
Lead Channel Setting Pacing Pulse Width: 0.4 ms
Lead Channel Setting Sensing Sensitivity: 2 mV
MDC IDC LEAD IMPLANT DT: 20091103
MDC IDC LEAD LOCATION: 753859
MDC IDC MSMT LEADCHNL RV PACING THRESHOLD AMPLITUDE: 1 V
MDC IDC MSMT LEADCHNL RV PACING THRESHOLD PULSEWIDTH: 0.4 ms
MDC IDC MSMT LEADCHNL RV PACING THRESHOLD PULSEWIDTH: 0.4 ms
MDC IDC MSMT LEADCHNL RV SENSING INTR AMPL: 12 mV
MDC IDC PG SERIAL: 7804513
MDC IDC SET LEADCHNL RV PACING AMPLITUDE: 2.5 V
MDC IDC STAT BRADY RV PERCENT PACED: 1.5 %

## 2015-10-26 NOTE — Progress Notes (Signed)
Changeout wound check appointment. Steri-strips removed. Wound without redness or edema. Incision edges approximated, wound well healed. Normal device function. Threshold, sensing, and impedances consistent with implant measurements. Device programmed at chronic output setting. Histogram distribution appropriate for patient and level of activity. No high ventricular rates noted. Not a candidate for [facility] per 08/26/15 JA note. Patient educated about wound care, arm mobility, lifting restrictions. ROV w/ JA 01/29/16.

## 2015-10-28 ENCOUNTER — Encounter: Payer: Medicare PPO | Admitting: Internal Medicine

## 2015-10-30 ENCOUNTER — Encounter: Payer: Self-pay | Admitting: Internal Medicine

## 2015-12-07 ENCOUNTER — Telehealth: Payer: Self-pay | Admitting: Cardiology

## 2015-12-07 ENCOUNTER — Telehealth: Payer: Self-pay

## 2015-12-07 NOTE — Telephone Encounter (Signed)
Patients device has been changed to pacemaker and can no longer detect fluid readings for HF symptoms.  Discussed with Dr Rayann Heman and he advised for Dr [name] to make recommendations for patient.  Routed to Dr [name], nurse and [facility] office triage for recommendations.

## 2015-12-07 NOTE — Telephone Encounter (Signed)
Spoke with pt and reminded pt of remote transmission that is due today. Pt verbalized understanding.   

## 2015-12-07 NOTE — Telephone Encounter (Signed)
Received call from patient.  He reported he sent a manual ICM transmission today and wanted to check if he is retaining fluid.  He stated his ankles are always swollen since last summer and he has gained 3-4 pounds in the last 2 weeks.  He reported he thinks the weight gain is from eating more and he reported he is compliant with low sodium diet because he does not use a salt shaker.  He does not read food labels for the sodium amounts and today he had canned beef stew.  Advised canned foods are normally high in sodium.  Reviewed the remote transmission and he now has a [id number] Pacemaker which does not have capability to measure fluid levels.  Advised would speak with Dr Rayann Heman for recommendations.

## 2015-12-08 NOTE — Telephone Encounter (Signed)
Previously he was taking lasix every other day. He can take it daily until weight is back to baseline.  Nayellie Sanseverino [name] MD, [facility]

## 2015-12-09 NOTE — Telephone Encounter (Signed)
Returned call to patient Dr.Jordan advised to take lasix 20 mg daily until weight gets back to baseline.

## 2015-12-22 ENCOUNTER — Ambulatory Visit (INDEPENDENT_AMBULATORY_CARE_PROVIDER_SITE_OTHER): Payer: Commercial Managed Care - HMO | Admitting: Cardiology

## 2015-12-22 ENCOUNTER — Other Ambulatory Visit: Payer: Self-pay | Admitting: *Deleted

## 2015-12-22 ENCOUNTER — Other Ambulatory Visit: Payer: Self-pay

## 2015-12-22 ENCOUNTER — Encounter: Payer: Self-pay | Admitting: Cardiology

## 2015-12-22 VITALS — BP 106/72 | HR 90 | Ht 71.0 in | Wt 167.0 lb

## 2015-12-22 DIAGNOSIS — R0602 Shortness of breath: Secondary | ICD-10-CM | POA: Diagnosis not present

## 2015-12-22 DIAGNOSIS — I10 Essential (primary) hypertension: Secondary | ICD-10-CM

## 2015-12-22 DIAGNOSIS — I482 Chronic atrial fibrillation, unspecified: Secondary | ICD-10-CM

## 2015-12-22 DIAGNOSIS — I509 Heart failure, unspecified: Secondary | ICD-10-CM | POA: Diagnosis not present

## 2015-12-22 DIAGNOSIS — I4821 Permanent atrial fibrillation: Secondary | ICD-10-CM

## 2015-12-22 DIAGNOSIS — I2510 Atherosclerotic heart disease of native coronary artery without angina pectoris: Secondary | ICD-10-CM

## 2015-12-22 DIAGNOSIS — I5022 Chronic systolic (congestive) heart failure: Secondary | ICD-10-CM | POA: Diagnosis not present

## 2015-12-22 DIAGNOSIS — I5023 Acute on chronic systolic (congestive) heart failure: Secondary | ICD-10-CM | POA: Diagnosis not present

## 2015-12-22 DIAGNOSIS — I4891 Unspecified atrial fibrillation: Secondary | ICD-10-CM | POA: Diagnosis not present

## 2015-12-22 LAB — CBC WITH DIFFERENTIAL/PLATELET
Basophils Absolute: 0.1 10*3/uL (ref 0.0–0.1)
Basophils Relative: 1 % (ref 0–1)
EOS ABS: 0.1 10*3/uL (ref 0.0–0.7)
Eosinophils Relative: 1 % (ref 0–5)
HCT: 43.8 % (ref 39.0–52.0)
HEMOGLOBIN: 13.8 g/dL (ref 13.0–17.0)
LYMPHS ABS: 0.7 10*3/uL (ref 0.7–4.0)
Lymphocytes Relative: 9 % — ABNORMAL LOW (ref 12–46)
MCH: 29.2 pg (ref 26.0–34.0)
MCHC: 31.5 g/dL (ref 30.0–36.0)
MCV: 92.8 fL (ref 78.0–100.0)
MONOS PCT: 11 % (ref 3–12)
MPV: 10.3 fL (ref 8.6–12.4)
Monocytes Absolute: 0.8 10*3/uL (ref 0.1–1.0)
NEUTROS ABS: 5.8 10*3/uL (ref 1.7–7.7)
NEUTROS PCT: 78 % — AB (ref 43–77)
Platelets: 204 10*3/uL (ref 150–400)
RBC: 4.72 MIL/uL (ref 4.22–5.81)
RDW: 17.2 % — ABNORMAL HIGH (ref 11.5–15.5)
WBC: 7.4 10*3/uL (ref 4.0–10.5)

## 2015-12-22 LAB — COMPREHENSIVE METABOLIC PANEL
ALK PHOS: 71 U/L (ref 40–115)
ALT: 35 U/L (ref 9–46)
AST: 53 U/L — AB (ref 10–35)
Albumin: 3.8 g/dL (ref 3.6–5.1)
BILIRUBIN TOTAL: 2.8 mg/dL — AB (ref 0.2–1.2)
BUN: 23 mg/dL (ref 7–25)
CALCIUM: 9.4 mg/dL (ref 8.6–10.3)
CO2: 28 mmol/L (ref 20–31)
Chloride: 103 mmol/L (ref 98–110)
Creat: 1.13 mg/dL — ABNORMAL HIGH (ref 0.70–1.11)
GLUCOSE: 88 mg/dL (ref 65–99)
Potassium: 4.4 mmol/L (ref 3.5–5.3)
Sodium: 139 mmol/L (ref 135–146)
Total Protein: 6.7 g/dL (ref 6.1–8.1)

## 2015-12-22 MED ORDER — FUROSEMIDE 40 MG PO TABS: 40.0000 mg | ORAL_TABLET | Freq: Every day | ORAL | Status: DC

## 2015-12-22 MED ORDER — POTASSIUM CHLORIDE CRYS ER 20 MEQ PO TBCR: 20.0000 meq | EXTENDED_RELEASE_TABLET | ORAL | Status: DC

## 2015-12-22 MED ORDER — METOPROLOL TARTRATE 50 MG PO TABS: 50.0000 mg | ORAL_TABLET | Freq: Two times a day (BID) | ORAL | Status: DC

## 2015-12-22 MED ORDER — DIGOXIN 125 MCG PO TABS: 62.5000 ug | ORAL_TABLET | Freq: Every day | ORAL | Status: DC

## 2015-12-22 MED ORDER — SPIRONOLACTONE 25 MG PO TABS
12.5000 mg | ORAL_TABLET | Freq: Every day | ORAL | Status: DC
Start: 2015-12-22 — End: 2015-12-22

## 2015-12-22 MED ORDER — SPIRONOLACTONE 25 MG PO TABS: 12.5000 mg | ORAL_TABLET | Freq: Every day | ORAL | Status: DC

## 2015-12-22 NOTE — Progress Notes (Signed)
[name] [name] Date of Birth: 1932/04/26 Medical Record [id number]  History of Present Illness: [name] [name] is seen back today for follow up of CAD and CHF. He has a history of coronary disease with remote inferior lateral myocardial infarction. He is status post CABG in 2002. He has an ischemic cardiomyopathy with ejection fraction of 35-40% by Echo in November 2012. He is s/p prior ICD implant. He also has a history of atrial fibrillation. In November he underwent change out of the ICD to a PPM only. He had only had ICD therapies for Afib with RVR before.  On follow up today he reports an increase in lower extremity swelling over the last 2-3 weeks associated with increased dyspnea on exertion. He was taking lasix 3 days/week and increased this to daily. Really hasn't noted much decrease in edema since then. No dizziness or palpitations. No chest pain.   Current Outpatient Prescriptions on File Prior to Visit  Medication Sig Dispense Refill  . cholecalciferol (VITAMIN D) 1000 UNITS tablet Take 1,000 Units by mouth daily.     . diphenoxylate-atropine (LOMOTIL) 2.5-0.025 MG per tablet Take 2 tablets by mouth 2 (two) times daily as needed. For diarrhea     . fluorouracil (EFUDEX) 5 % cream Apply 1 application topically 2 (two) times daily.    . furosemide (LASIX) 40 MG tablet Take 40 mg by mouth daily.    . metoprolol (LOPRESSOR) 50 MG tablet Take 1 tablet (50 mg total) by mouth 2 (two) times daily. 180 tablet 3  . Multiple Vitamins-Minerals (MULTIVITAMINS THER. W/MINERALS) TABS Take 1 tablet by mouth daily.      . nitroGLYCERIN (NITROSTAT) 0.4 MG SL tablet Place 1 tablet (0.4 mg total) under the tongue every 5 (five) minutes as needed for chest pain. 25 tablet 6  . potassium chloride SA (K-DUR,KLOR-CON) 20 MEQ tablet Take 20 mEq by mouth 3 (three) times a week. Takes one only when taking Lasix    . PROAIR HFA 108 (90 BASE) MCG/ACT inhaler Inhale 2 puffs into the lungs every 4 (four) hours as needed  for shortness of breath.     . psyllium (REGULOID) 0.52 G capsule Take 2 capsules by mouth daily.     . ramipril (ALTACE) 10 MG capsule Take 1 capsule by mouth daily.    . rosuvastatin (CRESTOR) 5 MG tablet Take 5 mg by mouth 2 (two) times a week.    . temazepam (RESTORIL) 7.5 MG capsule Take 1 capsule (7.5 mg total) by mouth at bedtime as needed for sleep. 30 capsule 0  . vitamin B-12 (CYANOCOBALAMIN) 1000 MCG tablet Take 1,000 mcg by mouth daily.     No current facility-administered medications on file prior to visit.    Allergies  Allergen Reactions  . Aspirin     Bleeding   . Iron     Stomach issues  . Lescol [Fluvastatin Sodium]     Dizziness  . Pentazocine Lactate     unknown  . Vytorin [Ezetimibe-Simvastatin]     Leg cramps     Past Medical History  Diagnosis Date  . Colon cancer ([place])     s/p chemo, RT, sugery  . Colorectal anastomotic stricture ([place])   . Cardiomyopathy     s/p ICD 09/30/08; EF 35 to 40% per echo in October 2021  . Iron deficiency anemia   . Diverticulosis   . ICD (implantable cardiac defibrillator) discharge October 2012    ICD shock for afib with RVR  . ED (  erectile dysfunction)   . CAD (coronary artery disease)     Old inferolateral MI s/p CABG in 2002  . HTN (hypertension)   . Paroxysmal atrial fibrillation ([facility])     controlled with amiodarone, not a coumadin candidate due to GI bleeding  . GERD (gastroesophageal reflux disease)     rarely  . CAP (community acquired pneumonia) 10/03/2011  . Chronic systolic CHF (congestive heart failure) ([place]) 07/24/2013    Past Surgical History  Procedure Laterality Date  . Vasectomy    . Colon resection      x2  . Coronary artery bypass graft    . Cholecystectomy    . Back surgery    . Hernia repair    . Cardiac defibrillator placement  09/30/08    MDT dual chamber ICD placed by Dr Rayann Heman  . Cardiac catheterization  11/07/2000    EF 32%  . US echocardiography  08/11/2008    EF 30-35%  .  Cardiovascular stress test  08/06/2008    EF 30%  . Appendectomy    . Ep implantable device N/A 10/15/2015    Procedure:  PPM Generator Changeout;  Surgeon: [name], MD;  Location: [place] [facility];  Service: Cardiovascular;  Laterality: N/A;    History  Smoking status  . Former Smoker  . Types: Cigars  Smokeless tobacco  . Never Used    History  Alcohol Use No    Family History  Problem Relation Age of Onset  . Colon cancer Father   . Diabetes Sister   . Colon polyps Brother   . Pancreatic cancer Brother 42    Review of Systems: The review of systems is per the HPI.    All other systems were reviewed and are negative.  Physical Exam: BP 106/72 mmHg  Pulse 90  Ht 5\' 11"  (1.803 m)  Wt 75.751 kg (167 lb)  BMI 23.30 kg/m2 Patient is very pleasant and in no acute distress. Skin is warm and dry. Color is normal.  HEENT is unremarkable. Normocephalic/atraumatic. PERRL. Sclera are nonicteric. Neck is supple. No masses. JVD elevated to 8-10 cm. Lungs are clear. Cardiac exam shows an irregular rate and rhythm. Normal S1 and S2. [id number] apical systolic murmur. Abdomen is soft. Extremities demonstrate 2-3+ edema. Gait and ROM are intact. No gross neurologic deficits noted.   LABORATORY DATA:  Lab Results  Component Value Date   WBC 6.2 10/15/2015   HGB 12.1* 10/15/2015   HCT 37.8* 10/15/2015   PLT 160 10/15/2015   GLUCOSE 90 10/15/2015   ALT 18 10/14/2013   AST 30 10/14/2013   NA 139 10/15/2015   K 4.3 10/15/2015   CL 105 10/15/2015   CREATININE 1.19 10/15/2015   BUN 16 10/15/2015   CO2 27 10/15/2015   TSH 1.87 07/26/2012   INR 1.18 10/02/2011   HGBA1C  06/11/2007    5.7 (NOTE)   The ADA recommends the following therapeutic goals for glycemic   control related to Hgb A1C measurement:   Goal of Therapy:   < 7.0% Hgb A1C   Action Suggested:  > 8.0% Hgb A1C   Ref:  Diabetes Care, 22, Suppl. 1, 1999     Assessment / Plan: 1. Acute on chronic systolic CHF. Clearly  volume overloaded today. On ACEi, Dig, metoprolol, and lasix. Will increase lasix to 40 mg po daily and add aldactone 12.5 mg daily. Check labs today including CBC, CMET, dig level, and BNP. Schedule for Echo. Follow up in 2  weeks. May be a candidate for [employer].   2. Atrial fibrillation. He has chronic Afib.  Not a candidate for anticoagulation due to major GI bleed history. Rate is well controlled. Continue  metoprolol. Since he is not a candidate for anticoagulation would not recommend cardioversion or ablation.  3. Ischemic cardiomyopathy as per # 1.   5. Coronary disease with old inferiolateral myocardial infarction. Status post CABG in 2002. Currently asymptomatic. Continue metoprolol. He is not a candidate for antiplatelet or anticoagulant therapy because of recurrent GI bleeding.  6. Hypertension, controlled.  7. Hypercholesterolemia.

## 2015-12-22 NOTE — Patient Instructions (Signed)
We will check lab work today  Increase lasix to 40 mg daily  Add aldactone 12.5 mg daily  Will will check an Echocardiogram  We will arrange follow up in 2 weeks.

## 2015-12-23 LAB — BRAIN NATRIURETIC PEPTIDE: Brain Natriuretic Peptide: 903.6 pg/mL — ABNORMAL HIGH (ref 0.0–100.0)

## 2015-12-23 LAB — DIGOXIN LEVEL: Digoxin Level: 0.6 ug/L — ABNORMAL LOW (ref 0.8–2.0)

## 2015-12-24 ENCOUNTER — Other Ambulatory Visit: Payer: Self-pay | Admitting: *Deleted

## 2015-12-24 MED ORDER — FUROSEMIDE 40 MG PO TABS: 40.0000 mg | ORAL_TABLET | Freq: Every day | ORAL | Status: DC

## 2015-12-24 MED ORDER — DIGOXIN 125 MCG PO TABS: 62.5000 ug | ORAL_TABLET | Freq: Every day | ORAL | Status: DC

## 2015-12-24 MED ORDER — POTASSIUM CHLORIDE CRYS ER 20 MEQ PO TBCR: 20.0000 meq | EXTENDED_RELEASE_TABLET | ORAL | Status: DC

## 2015-12-24 MED ORDER — METOPROLOL TARTRATE 50 MG PO TABS: 50.0000 mg | ORAL_TABLET | Freq: Two times a day (BID) | ORAL | Status: DC

## 2016-01-04 ENCOUNTER — Other Ambulatory Visit: Payer: Self-pay

## 2016-01-04 ENCOUNTER — Ambulatory Visit (HOSPITAL_COMMUNITY): Payer: Commercial Managed Care - HMO | Attending: Cardiovascular Disease

## 2016-01-04 DIAGNOSIS — I517 Cardiomegaly: Secondary | ICD-10-CM | POA: Insufficient documentation

## 2016-01-04 DIAGNOSIS — I351 Nonrheumatic aortic (valve) insufficiency: Secondary | ICD-10-CM | POA: Insufficient documentation

## 2016-01-04 DIAGNOSIS — I5022 Chronic systolic (congestive) heart failure: Secondary | ICD-10-CM | POA: Diagnosis not present

## 2016-01-04 DIAGNOSIS — R42 Dizziness and giddiness: Secondary | ICD-10-CM | POA: Insufficient documentation

## 2016-01-04 DIAGNOSIS — I4821 Permanent atrial fibrillation: Secondary | ICD-10-CM

## 2016-01-04 DIAGNOSIS — I482 Chronic atrial fibrillation: Secondary | ICD-10-CM | POA: Diagnosis not present

## 2016-01-04 DIAGNOSIS — I340 Nonrheumatic mitral (valve) insufficiency: Secondary | ICD-10-CM | POA: Insufficient documentation

## 2016-01-04 DIAGNOSIS — I509 Heart failure, unspecified: Secondary | ICD-10-CM | POA: Diagnosis present

## 2016-01-05 ENCOUNTER — Other Ambulatory Visit: Payer: Self-pay

## 2016-01-05 MED ORDER — DIGOXIN 125 MCG PO TABS: 62.5000 ug | ORAL_TABLET | Freq: Every day | ORAL | Status: DC

## 2016-01-05 MED ORDER — FUROSEMIDE 40 MG PO TABS: 40.0000 mg | ORAL_TABLET | Freq: Every day | ORAL | Status: DC

## 2016-01-05 MED ORDER — METOPROLOL TARTRATE 50 MG PO TABS: 50.0000 mg | ORAL_TABLET | Freq: Two times a day (BID) | ORAL | Status: DC

## 2016-01-05 MED ORDER — POTASSIUM CHLORIDE CRYS ER 20 MEQ PO TBCR: 20.0000 meq | EXTENDED_RELEASE_TABLET | ORAL | Status: DC

## 2016-01-08 ENCOUNTER — Encounter: Payer: Self-pay | Admitting: Internal Medicine

## 2016-01-11 ENCOUNTER — Other Ambulatory Visit: Payer: Self-pay | Admitting: *Deleted

## 2016-01-11 MED ORDER — POTASSIUM CHLORIDE CRYS ER 20 MEQ PO TBCR: 20.0000 meq | EXTENDED_RELEASE_TABLET | ORAL | Status: DC

## 2016-01-18 ENCOUNTER — Ambulatory Visit (INDEPENDENT_AMBULATORY_CARE_PROVIDER_SITE_OTHER): Payer: Commercial Managed Care - HMO | Admitting: Cardiology

## 2016-01-18 ENCOUNTER — Other Ambulatory Visit: Payer: Self-pay | Admitting: *Deleted

## 2016-01-18 ENCOUNTER — Encounter: Payer: Self-pay | Admitting: Cardiology

## 2016-01-18 VITALS — BP 126/78 | HR 92 | Ht 71.0 in | Wt 155.0 lb

## 2016-01-18 DIAGNOSIS — I2510 Atherosclerotic heart disease of native coronary artery without angina pectoris: Secondary | ICD-10-CM

## 2016-01-18 DIAGNOSIS — I10 Essential (primary) hypertension: Secondary | ICD-10-CM

## 2016-01-18 DIAGNOSIS — I4891 Unspecified atrial fibrillation: Secondary | ICD-10-CM | POA: Diagnosis not present

## 2016-01-18 DIAGNOSIS — I482 Chronic atrial fibrillation, unspecified: Secondary | ICD-10-CM

## 2016-01-18 DIAGNOSIS — I5022 Chronic systolic (congestive) heart failure: Secondary | ICD-10-CM | POA: Diagnosis not present

## 2016-01-18 DIAGNOSIS — I509 Heart failure, unspecified: Secondary | ICD-10-CM | POA: Diagnosis not present

## 2016-01-18 LAB — BASIC METABOLIC PANEL
BUN: 19 mg/dL (ref 7–25)
CALCIUM: 9.5 mg/dL (ref 8.6–10.3)
CO2: 29 mmol/L (ref 20–31)
CREATININE: 1.15 mg/dL — AB (ref 0.70–1.11)
Chloride: 103 mmol/L (ref 98–110)
GLUCOSE: 84 mg/dL (ref 65–99)
Potassium: 4.8 mmol/L (ref 3.5–5.3)
Sodium: 141 mmol/L (ref 135–146)

## 2016-01-18 LAB — BRAIN NATRIURETIC PEPTIDE: Brain Natriuretic Peptide: 850 pg/mL — ABNORMAL HIGH (ref <100)

## 2016-01-18 MED ORDER — FUROSEMIDE 40 MG PO TABS: 40.0000 mg | ORAL_TABLET | Freq: Every day | ORAL | Status: DC

## 2016-01-18 MED ORDER — SPIRONOLACTONE 25 MG PO TABS: 25.0000 mg | ORAL_TABLET | Freq: Every day | ORAL | Status: DC

## 2016-01-18 NOTE — Patient Instructions (Signed)
We will check blood work today  Increase Spironolactone to 25 mg daily  Reduce lasix to 40 mg alternating with 20 mg daily.  I will see you in 2 months.

## 2016-01-18 NOTE — Progress Notes (Signed)
[name] [name] Date of Birth: 1931/12/02 Medical Record [id number]  History of Present Illness: [name] [name] is seen back today for follow up of CAD and CHF. He has a history of coronary disease with remote inferior lateral myocardial infarction. He is status post CABG in 2002. He has an ischemic cardiomyopathy with ejection fraction of 35-40% by Echo in November 2012. He is s/p prior ICD implant with last change out in [place]. He also has a history of atrial fibrillation. In November he underwent change out of the ICD to a PPM only. He had only had ICD therapies for Afib with RVR before.  When seen last month he had evidence of worsening CHF. BNP was elevated to >900. We added aldactone 12.5 mg daily and increased lasix to 40 mg daily. He has lost 12 lbs. Edema is much better. He is still SOB with activity. Complains of frequent urination with diuretics. Has cramps in his hands. No chest pain.   Current Outpatient Prescriptions on File Prior to Visit  Medication Sig Dispense Refill  . cholecalciferol (VITAMIN D) 1000 UNITS tablet Take 1,000 Units by mouth daily.     . digoxin (LANOXIN) 0.125 MG tablet Take 0.5 tablets (62.5 mcg total) by mouth daily. 90 tablet 3  . diphenoxylate-atropine (LOMOTIL) 2.5-0.025 MG per tablet Take 2 tablets by mouth 2 (two) times daily as needed. For diarrhea     . fluorouracil (EFUDEX) 5 % cream Apply 1 application topically 2 (two) times daily.    . metoprolol (LOPRESSOR) 50 MG tablet Take 1 tablet (50 mg total) by mouth 2 (two) times daily. 180 tablet 3  . Multiple Vitamins-Minerals (MULTIVITAMINS THER. W/MINERALS) TABS Take 1 tablet by mouth daily.      . nitroGLYCERIN (NITROSTAT) 0.4 MG SL tablet Place 1 tablet (0.4 mg total) under the tongue every 5 (five) minutes as needed for chest pain. 25 tablet 6  . potassium chloride SA (K-DUR,KLOR-CON) 20 MEQ tablet Take 1 tablet (20 mEq total) by mouth 3 (three) times a week. Takes one only when taking Lasix (Patient taking  differently: Take 20 mEq by mouth daily. Takes one only when taking Lasix) 90 tablet 0  . PROAIR HFA 108 (90 BASE) MCG/ACT inhaler Inhale 2 puffs into the lungs every 4 (four) hours as needed for shortness of breath.     . psyllium (REGULOID) 0.52 G capsule Take 2 capsules by mouth daily.     . ramipril (ALTACE) 10 MG capsule Take 1 capsule by mouth daily.    . rosuvastatin (CRESTOR) 5 MG tablet Take 5 mg by mouth 2 (two) times a week.    . temazepam (RESTORIL) 7.5 MG capsule Take 1 capsule (7.5 mg total) by mouth at bedtime as needed for sleep. 30 capsule 0  . vitamin B-12 (CYANOCOBALAMIN) 1000 MCG tablet Take 1,000 mcg by mouth daily.     No current facility-administered medications on file prior to visit.    Allergies  Allergen Reactions  . Aspirin     Bleeding   . Iron     Stomach issues  . Lescol [Fluvastatin Sodium]     Dizziness  . Pentazocine Lactate     unknown  . Vytorin [Ezetimibe-Simvastatin]     Leg cramps     Past Medical History  Diagnosis Date  . Colon cancer ([place])     s/p chemo, RT, sugery  . Colorectal anastomotic stricture ([place])   . Cardiomyopathy     s/p ICD 09/30/08; EF 35 to 40%  per echo in October 2021  . Iron deficiency anemia   . Diverticulosis   . ICD (implantable cardiac defibrillator) discharge October 2012    ICD shock for afib with RVR  . ED (erectile dysfunction)   . CAD (coronary artery disease)     Old inferolateral MI s/p CABG in 2002  . HTN (hypertension)   . Paroxysmal atrial fibrillation (HCC)     controlled with amiodarone, not a coumadin candidate due to GI bleeding  . GERD (gastroesophageal reflux disease)     rarely  . CAP (community acquired pneumonia) 10/03/2011  . Chronic systolic CHF (congestive heart failure) ([place]) 07/24/2013    Past Surgical History  Procedure Laterality Date  . Vasectomy    . Colon resection      x2  . Coronary artery bypass graft    . Cholecystectomy    . Back surgery    . Hernia repair    .  Cardiac defibrillator placement  09/30/08    MDT dual chamber ICD placed by Dr Rayann Heman  . Cardiac catheterization  11/07/2000    EF 32%  . US echocardiography  08/11/2008    EF 30-35%  . Cardiovascular stress test  08/06/2008    EF 30%  . Appendectomy    . Ep implantable device N/A 10/15/2015    Procedure:  PPM Generator Changeout;  Surgeon: [name], MD;  Location: [facility];  Service: Cardiovascular;  Laterality: N/A;    History  Smoking status  . Former Smoker  . Types: Cigars  Smokeless tobacco  . Never Used    History  Alcohol Use No    Family History  Problem Relation Age of Onset  . Colon cancer Father   . Diabetes Sister   . Colon polyps Brother   . Pancreatic cancer Brother 61    Review of Systems: The review of systems is per the HPI.    All other systems were reviewed and are negative.  Physical Exam: BP 126/78 mmHg  Pulse 92  Ht 5\' 11"  (1.803 m)  Wt 70.308 kg (155 lb)  BMI 21.63 kg/m2 Patient is very pleasant and in no acute distress. Skin is warm and dry. Color is normal.  HEENT is unremarkable. Normocephalic/atraumatic. PERRL. Sclera are nonicteric. Neck is supple. No masses. JVD is down to 4 cm. Lungs are clear. Cardiac exam shows an irregular rate and rhythm. Normal S1 and S2. [id number] apical systolic murmur. Abdomen is soft. Extremities demonstrate tr-1+ edema. Gait and ROM are intact. No gross neurologic deficits noted.   LABORATORY DATA:  Lab Results  Component Value Date   WBC 7.4 12/22/2015   HGB 13.8 12/22/2015   HCT 43.8 12/22/2015   PLT 204 12/22/2015   GLUCOSE 88 12/22/2015   ALT 35 12/22/2015   AST 53* 12/22/2015   NA 139 12/22/2015   K 4.4 12/22/2015   CL 103 12/22/2015   CREATININE 1.13* 12/22/2015   BUN 23 12/22/2015   CO2 28 12/22/2015   TSH 1.87 07/26/2012   INR 1.18 10/02/2011   HGBA1C  06/11/2007    5.7 (NOTE)   The ADA recommends the following therapeutic goals for glycemic   control related to Hgb A1C  measurement:   Goal of Therapy:   < 7.0% Hgb A1C   Action Suggested:  > 8.0% Hgb A1C   Ref:  Diabetes Care, 22, Suppl. 1, 1999   BNP 904  Dig level 0.6  Echo: 01/04/16:Study Conclusions  - Left ventricle: Inferior  wall hypokinesis The cavity size was moderately dilated. Wall thickness was increased in a pattern of mild LVH. Systolic function was mildly to moderately reduced. The estimated ejection fraction was in the range of 40% to 45%. - Aortic valve: There was mild regurgitation. - Mitral valve: There was moderate regurgitation. - Left atrium: The atrium was moderately dilated. - Right ventricle: The cavity size was mildly dilated. - Right atrium: The atrium was mildly dilated. - Atrial septum: No defect or patent foramen ovale was identified. - Pulmonary arteries: PA peak pressure: 71 mm Hg (S).   Assessment / Plan: 1. Chronic systolic CHF. EF 40-45% by recent Echo. Moderate MR. Pulmonary HTN. Significant improvement today. Weight down 12 lbs with improved edema.  On ACEi, Dig, metoprolol, and lasix. Will change lasix to 40 mg alternating with 20 mg daily. Increase aldactone to 25 mg daily.  Check labs today including BMET and  BNP. Follow up in 2 months.   2. Atrial fibrillation. He has chronic Afib.  Not a candidate for anticoagulation due to major GI bleed history. Rate is well controlled. Continue  metoprolol. Since he is not a candidate for anticoagulation would not recommend cardioversion or ablation.  3. Ischemic cardiomyopathy as per # 1.   5. Coronary disease with old inferiolateral myocardial infarction. Status post CABG in 2002. Currently asymptomatic. Continue metoprolol. He is not a candidate for antiplatelet or anticoagulant therapy because of recurrent GI bleeding.  6. Hypertension, controlled.  7. Hypercholesterolemia.

## 2016-01-19 ENCOUNTER — Other Ambulatory Visit: Payer: Self-pay

## 2016-01-20 ENCOUNTER — Other Ambulatory Visit: Payer: Self-pay | Admitting: *Deleted

## 2016-01-26 ENCOUNTER — Encounter: Payer: Self-pay | Admitting: Internal Medicine

## 2016-01-26 ENCOUNTER — Ambulatory Visit (INDEPENDENT_AMBULATORY_CARE_PROVIDER_SITE_OTHER): Payer: Commercial Managed Care - HMO | Admitting: Internal Medicine

## 2016-01-26 VITALS — BP 122/72 | HR 117 | Ht 71.0 in | Wt 153.0 lb

## 2016-01-26 DIAGNOSIS — I482 Chronic atrial fibrillation, unspecified: Secondary | ICD-10-CM

## 2016-01-26 DIAGNOSIS — R001 Bradycardia, unspecified: Secondary | ICD-10-CM | POA: Diagnosis not present

## 2016-01-26 LAB — CUP PACEART INCLINIC DEVICE CHECK
Date Time Interrogation Session: 20170228172545
Implantable Lead Implant Date: 20091103
Implantable Lead Model: 5076
Implantable Lead Model: 6947
Lead Channel Impedance Value: 425 Ohm
Lead Channel Pacing Threshold Amplitude: 1 V
Lead Channel Pacing Threshold Pulse Width: 0.4 ms
Lead Channel Sensing Intrinsic Amplitude: 12 mV
MDC IDC LEAD IMPLANT DT: 20091103
MDC IDC LEAD LOCATION: 753859
MDC IDC LEAD LOCATION: 753860
MDC IDC MSMT BATTERY REMAINING LONGEVITY: 127.2
MDC IDC MSMT BATTERY VOLTAGE: 3.05 V
MDC IDC SET LEADCHNL RV PACING AMPLITUDE: 2.5 V
MDC IDC SET LEADCHNL RV PACING PULSEWIDTH: 0.4 ms
MDC IDC SET LEADCHNL RV SENSING SENSITIVITY: 2 mV
MDC IDC STAT BRADY RV PERCENT PACED: 0.55 %
Pulse Gen Serial Number: 7804513

## 2016-01-26 MED ORDER — METOPROLOL SUCCINATE ER 100 MG PO TB24: 100.0000 mg | ORAL_TABLET | Freq: Every day | ORAL | Status: DC

## 2016-01-26 NOTE — Patient Instructions (Signed)
Medication Instructions:  Your physician has recommended you make the following change in your medication:  1) Stop Lopressor 2) Start Metoprolol XL100mg  daily    Labwork: None ordered   Testing/Procedures: None ordered   Follow-Up: Your physician recommends that you schedule a follow-up appointment in: 2 weeks with Chanetta Marshall, NP   Any Other Special Instructions Will Be Listed Below (If Applicable).     If you need a refill on your cardiac medications before your next appointment, please call your pharmacy.

## 2016-01-26 NOTE — Progress Notes (Signed)
PCP:  Jerlyn Ly, MD Primary Cardiologist:  Dr [name]  The patient presents today for routine electrophysiology followup.  He has fatigue and exertional SOB.  Today, he denies symptoms of palpitations, chest pain, shortness of breath,  lower extremity edema, dizziness, presyncope, syncope, or ICD shocks.  The patient feels that he is tolerating medications without difficulties and is otherwise without complaint today.   Past Medical History  Diagnosis Date  . Colon cancer ([place])     s/p chemo, RT, sugery  . Colorectal anastomotic stricture ([place])   . Cardiomyopathy     s/p ICD 09/30/08; EF 35 to 40% per echo in October 2021  . Iron deficiency anemia   . Diverticulosis   . ICD (implantable cardiac defibrillator) discharge October 2012    ICD shock for afib with RVR  . ED (erectile dysfunction)   . CAD (coronary artery disease)     Old inferolateral MI s/p CABG in 2002  . HTN (hypertension)   . Paroxysmal atrial fibrillation (HCC)     controlled with amiodarone, not a coumadin candidate due to GI bleeding  . GERD (gastroesophageal reflux disease)     rarely  . CAP (community acquired pneumonia) 10/03/2011  . Chronic systolic CHF (congestive heart failure) ([place]) 07/24/2013   Past Surgical History  Procedure Laterality Date  . Vasectomy    . Colon resection      x2  . Coronary artery bypass graft    . Cholecystectomy    . Back surgery    . Hernia repair    . Cardiac defibrillator placement  09/30/08    MDT dual chamber ICD placed by Dr Rayann Heman  . Cardiac catheterization  11/07/2000    EF 32%  . US echocardiography  08/11/2008    EF 30-35%  . Cardiovascular stress test  08/06/2008    EF 30%  . Appendectomy    . Ep implantable device N/A 10/15/2015    Procedure:  PPM Generator Changeout;  Surgeon: [name], MD;  Location: [facility];  Service: Cardiovascular;  Laterality: N/A;    Current Outpatient Prescriptions  Medication Sig Dispense Refill  . cholecalciferol  (VITAMIN D) 1000 UNITS tablet Take 1,000 Units by mouth daily.     . digoxin (LANOXIN) 0.125 MG tablet Take 0.5 tablets (62.5 mcg total) by mouth daily. 90 tablet 3  . diphenoxylate-atropine (LOMOTIL) 2.5-0.025 MG per tablet Take 2 tablets by mouth 2 (two) times daily as needed. For diarrhea     . fluorouracil (EFUDEX) 5 % cream Apply 1 application topically 2 (two) times daily.    . furosemide (LASIX) 40 MG tablet Take 1 tablet (40 mg total) by mouth daily. Take 40 mg alternating with 20 mg daily 90 tablet 3  . metoprolol (LOPRESSOR) 50 MG tablet Take 1 tablet (50 mg total) by mouth 2 (two) times daily. 180 tablet 3  . Multiple Vitamins-Minerals (MULTIVITAMINS THER. W/MINERALS) TABS Take 1 tablet by mouth daily.      . nitroGLYCERIN (NITROSTAT) 0.4 MG SL tablet Place 1 tablet (0.4 mg total) under the tongue every 5 (five) minutes as needed for chest pain. 25 tablet 6  . PROAIR HFA 108 (90 BASE) MCG/ACT inhaler Inhale 2 puffs into the lungs every 4 (four) hours as needed for shortness of breath.     . psyllium (REGULOID) 0.52 G capsule Take 2 capsules by mouth daily.     . ramipril (ALTACE) 10 MG capsule Take 1 capsule by mouth daily.    Marland Kitchen  rosuvastatin (CRESTOR) 5 MG tablet Take 5 mg by mouth 2 (two) times a week.    . spironolactone (ALDACTONE) 25 MG tablet Take 1 tablet (25 mg total) by mouth daily. 90 tablet 11  . temazepam (RESTORIL) 7.5 MG capsule Take 1 capsule (7.5 mg total) by mouth at bedtime as needed for sleep. 30 capsule 0  . vitamin B-12 (CYANOCOBALAMIN) 1000 MCG tablet Take 1,000 mcg by mouth daily.     No current facility-administered medications for this visit.    Allergies  Allergen Reactions  . Aspirin     Bleeding   . Iron     Stomach issues  . Lescol [Fluvastatin Sodium]     Dizziness  . Pentazocine Lactate     unknown  . Vytorin [Ezetimibe-Simvastatin]     Leg cramps     Social History   Social History  . Marital Status: Married    Spouse Name: N/A  .  Number of Children: 2  . Years of Education: N/A   Occupational History  . Retired     Social History Main Topics  . Smoking status: Former Smoker    Types: Cigars  . Smokeless tobacco: Never Used  . Alcohol Use: No  . Drug Use: No  . Sexual Activity: Not on file   Other Topics Concern  . Not on file   Social History Narrative   Lives in [place] with spouse.  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 Colon cancer Father   . Diabetes Sister   . Colon polyps Brother   . Pancreatic cancer Brother 51    ROS-  All systems are reviewed and are negative except as outlined in the HPI above   Physical Exam: Filed Vitals:   01/26/16 1641  BP: 122/72  Pulse: 117  Height: 5\' 11"  (1.803 m)  Weight: 153 lb (69.4 kg)    GEN- The patient is well appearing, alert and oriented x 3 today.   Head- normocephalic, atraumatic Eyes-  Sclera clear, conjunctiva pink Ears- hearing intact Oropharynx- clear Neck- supple,  Lungs- Clear to ausculation bilaterally, normal work of breathing Chest- ICD pocket is well healed Heart- tachycardic irregular rhythm,  no murmurs, rubs or gallops, PMI not laterally displaced GI- soft, NT, ND, + BS Extremities- no clubbing, cyanosis, or edema PPM  interrogation- reviewed in detail today,  See PACEART report  Assessment and Plan:  1. Acute on chronic systolic dysfunction Doing well s/p down grade to pacemaker CHF likely exacerbated by afib with RVR  2. Permanent atrial fibrillation Amiodarone previously discontinued Not a candidate for anticoagualtion Could consider left atrial appendage closure in the future however Dr Morrison Old notes suggest he is not a candidate for antiplatelet therapy either Given RVR, will stop lopressor 50mg  BID and start toprol xl 100mg  daily Return in 2 weeks to see EP APP for further  evaluation of rate control.  If V rates are still elevated, would add additional toprol xl 50mg  qpm  3. CAD No ischemic symptoms  Return in 2 weeks to see EP NP Merlin Return to see me in 1 year Follow-up with Dr [name] as scheduled  [name] MD, [facility] 01/26/2016 5:01 PM

## 2016-01-29 ENCOUNTER — Encounter: Payer: Medicare PPO | Admitting: Internal Medicine

## 2016-02-25 ENCOUNTER — Encounter: Payer: Self-pay | Admitting: Nurse Practitioner

## 2016-02-25 ENCOUNTER — Ambulatory Visit (INDEPENDENT_AMBULATORY_CARE_PROVIDER_SITE_OTHER): Payer: Commercial Managed Care - HMO | Admitting: Nurse Practitioner

## 2016-02-25 ENCOUNTER — Encounter: Payer: Self-pay | Admitting: Internal Medicine

## 2016-02-25 VITALS — BP 102/80 | HR 96 | Ht 71.0 in | Wt 159.0 lb

## 2016-02-25 DIAGNOSIS — I482 Chronic atrial fibrillation, unspecified: Secondary | ICD-10-CM

## 2016-02-25 DIAGNOSIS — I4821 Permanent atrial fibrillation: Secondary | ICD-10-CM

## 2016-02-25 DIAGNOSIS — I5022 Chronic systolic (congestive) heart failure: Secondary | ICD-10-CM

## 2016-02-25 LAB — BASIC METABOLIC PANEL
BUN: 18 mg/dL (ref 7–25)
CALCIUM: 9.3 mg/dL (ref 8.6–10.3)
CO2: 25 mmol/L (ref 20–31)
Chloride: 104 mmol/L (ref 98–110)
Creat: 1.12 mg/dL — ABNORMAL HIGH (ref 0.70–1.11)
Glucose, Bld: 74 mg/dL (ref 65–99)
Potassium: 4.5 mmol/L (ref 3.5–5.3)
SODIUM: 139 mmol/L (ref 135–146)

## 2016-02-25 LAB — BRAIN NATRIURETIC PEPTIDE: Brain Natriuretic Peptide: 379.4 pg/mL — ABNORMAL HIGH (ref <100)

## 2016-02-25 MED ORDER — METOPROLOL SUCCINATE ER 100 MG PO TB24: ORAL_TABLET | ORAL | Status: DC

## 2016-02-25 NOTE — Progress Notes (Signed)
Electrophysiology Office Note Date: 02/25/2016  ID:  [name] [name], [name] [name] 1932/06/13, MRN [id number]  PCP: Jerlyn Ly, MD Primary Cardiologist: [name] Electrophysiologist: Allred  CC: Atrial fibrillation follow up  [name] [name] is a 82 y.o. male seen today for Dr Rayann Heman.  He presents today for follow up of atrial fibrillation.  Since last being seen in our clinic, the patient reports doing reasonably well.  He has persistent shortness of breath with exertion that he does not think has changed significantly over last 6-8 weeks. He also has persistent orthopnea and LE edema that has been present for years.  He denies chest pain, palpitations, PND, nausea, vomiting, dizziness, syncope, edema, weight gain, or early satiety.  Device History: MDT dual chamber ICD implanted 2009 for primary prevention; downgrade to STJ dual chamber PPM implanted 2016  Past Medical History  Diagnosis Date  . Colon cancer ([place])     s/p chemo, RT, sugery  . Colorectal anastomotic stricture ([place])   . Cardiomyopathy     s/p ICD 09/30/08; EF 35 to 40% per echo in October 2021  . Iron deficiency anemia   . Diverticulosis   . ICD (implantable cardiac defibrillator) discharge October 2012    ICD shock for afib with RVR  . ED (erectile dysfunction)   . CAD (coronary artery disease)     Old inferolateral MI s/p CABG in 2002  . HTN (hypertension)   . Paroxysmal atrial fibrillation (HCC)     controlled with amiodarone, not a coumadin candidate due to GI bleeding  . GERD (gastroesophageal reflux disease)     rarely  . CAP (community acquired pneumonia) 10/03/2011  . Chronic systolic CHF (congestive heart failure) ([place]) 07/24/2013   Past Surgical History  Procedure Laterality Date  . Vasectomy    . Colon resection      x2  . Coronary artery bypass graft    . Cholecystectomy    . Back surgery    . Hernia repair    . Cardiac defibrillator placement  09/30/08    MDT dual chamber ICD placed by Dr Rayann Heman  .  Cardiac catheterization  11/07/2000    EF 32%  . US echocardiography  08/11/2008    EF 30-35%  . Cardiovascular stress test  08/06/2008    EF 30%  . Appendectomy    . Ep implantable device N/A 10/15/2015    Procedure:  PPM Generator Changeout;  Surgeon: [name], MD;  Location: [facility];  Service: Cardiovascular;  Laterality: N/A;    Current Outpatient Prescriptions  Medication Sig Dispense Refill  . cholecalciferol (VITAMIN D) 1000 UNITS tablet Take 1,000 Units by mouth daily.     . digoxin (LANOXIN) 0.125 MG tablet Take 0.5 tablets (62.5 mcg total) by mouth daily. 90 tablet 3  . diphenoxylate-atropine (LOMOTIL) 2.5-0.025 MG per tablet Take 2 tablets by mouth 2 (two) times daily as needed. For diarrhea     . fluorouracil (EFUDEX) 5 % cream Apply 1 application topically 2 (two) times daily.    . furosemide (LASIX) 40 MG tablet Take 1 tablet (40 mg total) by mouth daily. Take 40 mg alternating with 20 mg daily 90 tablet 3  . metoprolol succinate (TOPROL-XL) 100 MG 24 hr tablet START 02/25/16.TAKE A TABLET AND HALF DAILY 100 IN AM 50 IN PM 90 tablet 3  . Multiple Vitamins-Minerals (MULTIVITAMINS THER. W/MINERALS) TABS Take 1 tablet by mouth daily.      . nitroGLYCERIN (NITROSTAT) 0.4 MG SL tablet  Place 1 tablet (0.4 mg total) under the tongue every 5 (five) minutes as needed for chest pain. 25 tablet 6  . PROAIR HFA 108 (90 BASE) MCG/ACT inhaler Inhale 2 puffs into the lungs every 4 (four) hours as needed for shortness of breath.     . psyllium (REGULOID) 0.52 G capsule Take 2 capsules by mouth daily.     . ramipril (ALTACE) 10 MG capsule Take 1 capsule by mouth daily.    . rosuvastatin (CRESTOR) 5 MG tablet Take 5 mg by mouth 2 (two) times a week.    . spironolactone (ALDACTONE) 25 MG tablet Take 1 tablet (25 mg total) by mouth daily. 90 tablet 11  . temazepam (RESTORIL) 7.5 MG capsule Take 1 capsule (7.5 mg total) by mouth at bedtime as needed for sleep. 30 capsule 0  . vitamin  B-12 (CYANOCOBALAMIN) 1000 MCG tablet Take 1,000 mcg by mouth daily.     No current facility-administered medications for this visit.    Allergies:   Aspirin; Iron; Lescol; Pentazocine lactate; and Vytorin   Social History: Social History   Social History  . Marital Status: Married    Spouse Name: N/A  . Number of Children: 2  . Years of Education: N/A   Occupational History  . Retired     Social History Main Topics  . Smoking status: Former Smoker    Types: Cigars  . Smokeless tobacco: Never Used  . Alcohol Use: No  . Drug Use: No  . Sexual Activity: Not on file   Other Topics Concern  . Not on file   Social History Narrative   Lives in [place] with spouse.  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 Colon cancer Father   . Diabetes Sister   . Colon polyps Brother   . Pancreatic cancer Brother 68    Review of Systems: All other systems reviewed and are otherwise negative except as noted above.   Physical Exam: VS:  BP 102/80 mmHg  Pulse 96  Ht 5\' 11"  (1.803 m)  Wt 159 lb (72.122 kg)  BMI 22.19 kg/m2 , BMI Body mass index is 22.19 kg/(m^2).  GEN- The patient is elderly appearing, alert and oriented x 3 today.   HEENT: normocephalic, atraumatic; sclera clear, conjunctiva pink; hearing intact; oropharynx clear; neck supple  Lungs- Clear to ausculation bilaterally, normal work of breathing.  No wheezes, rales, rhonchi Heart- Irregular rate and rhythm  GI- soft, non-tender, non-distended, bowel sounds present  Extremities- no clubbing, cyanosis, 1+ BLE edema  MS- no significant deformity or atrophy Skin- warm and dry, no rash or lesion; PPM pocket well healed Psych- euthymic mood, full affect Neuro- strength and sensation are intact  PPM Interrogation- reviewed in detail today,  See PACEART report  EKG:   EKG is not ordered today.  Recent Labs: 12/22/2015: ALT 35; Hemoglobin 13.8; Platelets 204 01/18/2016: BUN 19; Creat 1.15*; Potassium 4.8; Sodium 141   Wt Readings from Last 3 Encounters:  02/25/16 159 lb (72.122 kg)  01/26/16 153 lb (69.4 kg)  01/18/16 155 lb (70.308 kg)     Other studies Reviewed: Additional studies/ records that were reviewed today include: Dr Rayann Heman and Dr Doug Sou office notes   Assessment and Plan:  1.  Chronic systolic heart failure  Continue current therapy  BMET, BNP today   2.  Permanent atrial fibrillation Normal PPM function See Pace Art report V rates still elevated with change to Toprol XL 100mg  daily.  Increase Toprol to 100mg  qam and 50mg  qpm  No OAC with prior GI bleeding.  Per Dr [name], not a candidate for anti-platelet therapy either so [facility] likely not an option  3.  CAD No recent ischemic symptoms    Current medicines are reviewed at length with the patient today.   The patient does not have concerns regarding his medicines.  The following changes were made today:  none  Labs/ tests ordered today include:  Orders Placed This Encounter  Procedures  . Basic Metabolic Panel (BMET)  . B Nat Peptide    Disposition:   Follow up with Merlin transmissions, me in 4 weeks    Signed, Chanetta Marshall, NP 02/25/2016 9:49 AM  [address] [place] [address] [phone] (office) [phone] (fax)

## 2016-02-25 NOTE — Patient Instructions (Signed)
Medication Instructions:   START TAKING METOPROLOL TABLET AND HALF OF 100 MG DAILY .Marland Kitchen..100 MG IN AM 50 MG IN PM   If you need a refill on your cardiac medications before your next appointment, please call your pharmacy.  Labwork: BMET AND BNP    Testing/Procedures: NONE ORDER TODAY   Follow-Up: WITH AMBER IN 4 WEEKS   10:20 AM  03/30/16. PER  SEILER ...   Any Other Special Instructions Will Be Listed Below (If Applicable).

## 2016-02-26 LAB — CUP PACEART INCLINIC DEVICE CHECK
Date Time Interrogation Session: 20170331130114
Implantable Lead Location: 753860
Lead Channel Setting Sensing Sensitivity: 2 mV
MDC IDC LEAD IMPLANT DT: 20091103
MDC IDC LEAD IMPLANT DT: 20091103
MDC IDC LEAD LOCATION: 753859
MDC IDC SET LEADCHNL RV PACING AMPLITUDE: 2.5 V
MDC IDC SET LEADCHNL RV PACING PULSEWIDTH: 0.4 ms
Pulse Gen Model: 1240
Pulse Gen Serial Number: 7804513

## 2016-02-29 ENCOUNTER — Telehealth: Payer: Self-pay | Admitting: *Deleted

## 2016-02-29 ENCOUNTER — Telehealth: Payer: Self-pay

## 2016-02-29 NOTE — Telephone Encounter (Signed)
SPOKE TO PT

## 2016-02-29 NOTE — Telephone Encounter (Signed)
Patient called received a staff message from Nina about you being sob.Patient stated he saw Chanetta Marshall NP 02/25/16.Stated he understood from Dr.Allred his pacemaker needed to be turned down.Stated pacemaker was not turned down.Stated metoprolol was changed to 100 mg am and 50 mg pm.Stated since medication change he can hear heart beating in ears when he bends over or lies down.Stated he feels heart beating in chest.Stated he has never felt this before.He continues to be sob.When checking staff schedule Amber out until 03/02/16.Message sent to [facility] for advice.

## 2016-03-03 NOTE — Telephone Encounter (Signed)
Scheduled to see Dr [name] 4/27. Perhaps we can have his device interrogated then.  I cannot see him before that time.

## 2016-03-03 NOTE — Telephone Encounter (Signed)
Returned call to patient.Dr.Allred's recommendations given.Advised I will check to see if PM can be interrogated at 03/24/16 office visit with Dr.Jordan.

## 2016-03-22 DIAGNOSIS — I10 Essential (primary) hypertension: Secondary | ICD-10-CM | POA: Diagnosis not present

## 2016-03-22 DIAGNOSIS — Z125 Encounter for screening for malignant neoplasm of prostate: Secondary | ICD-10-CM | POA: Diagnosis not present

## 2016-03-22 DIAGNOSIS — R7301 Impaired fasting glucose: Secondary | ICD-10-CM | POA: Diagnosis not present

## 2016-03-22 DIAGNOSIS — E559 Vitamin D deficiency, unspecified: Secondary | ICD-10-CM | POA: Diagnosis not present

## 2016-03-22 DIAGNOSIS — E784 Other hyperlipidemia: Secondary | ICD-10-CM | POA: Diagnosis not present

## 2016-03-23 DIAGNOSIS — L821 Other seborrheic keratosis: Secondary | ICD-10-CM | POA: Diagnosis not present

## 2016-03-23 DIAGNOSIS — L570 Actinic keratosis: Secondary | ICD-10-CM | POA: Diagnosis not present

## 2016-03-23 DIAGNOSIS — L820 Inflamed seborrheic keratosis: Secondary | ICD-10-CM | POA: Diagnosis not present

## 2016-03-23 DIAGNOSIS — D044 Carcinoma in situ of skin of scalp and neck: Secondary | ICD-10-CM | POA: Diagnosis not present

## 2016-03-23 DIAGNOSIS — Z85828 Personal history of other malignant neoplasm of skin: Secondary | ICD-10-CM | POA: Diagnosis not present

## 2016-03-24 ENCOUNTER — Encounter: Payer: Self-pay | Admitting: Cardiology

## 2016-03-24 ENCOUNTER — Ambulatory Visit (INDEPENDENT_AMBULATORY_CARE_PROVIDER_SITE_OTHER): Payer: Commercial Managed Care - HMO | Admitting: Cardiology

## 2016-03-24 VITALS — BP 108/72 | HR 109 | Ht 71.0 in | Wt 152.8 lb

## 2016-03-24 DIAGNOSIS — I482 Chronic atrial fibrillation, unspecified: Secondary | ICD-10-CM

## 2016-03-24 DIAGNOSIS — I2510 Atherosclerotic heart disease of native coronary artery without angina pectoris: Secondary | ICD-10-CM

## 2016-03-24 DIAGNOSIS — I5022 Chronic systolic (congestive) heart failure: Secondary | ICD-10-CM

## 2016-03-24 MED ORDER — METOPROLOL TARTRATE 50 MG PO TABS: 25.0000 mg | ORAL_TABLET | Freq: Every day | ORAL | Status: DC

## 2016-03-24 MED ORDER — METOPROLOL SUCCINATE ER 100 MG PO TB24: ORAL_TABLET | ORAL | Status: DC

## 2016-03-24 NOTE — Progress Notes (Signed)
[name] [name] Date of Birth: Jan 22, 1932 Medical Record [id number]  History of Present Illness: [name] [name] is seen back today for follow up of CAD and CHF. He has a history of coronary disease with remote inferior lateral myocardial infarction. He is status post CABG in 2002. He has an ischemic cardiomyopathy with ejection fraction of 35-40% by Echo in November 2012. He is s/p prior ICD implant with last change out in [facility] 2016. He also has a history of atrial fibrillation. In November he underwent change out of the ICD to a PPM only. He had only had ICD therapies for Afib with RVR before.  Earlier this year he had increased CHF. He was started on aldactone and lasix dose was increased. He had a brisk diuresis and improvement. He found it difficult to take lasix daily because it made him fell wiped out. Now taking every other day.  Edema is much better. He is still SOB if he walks a long distance.  No chest pain. When seen in device clinic he was noted to have some high ventricular rates. Toprol XL was increased to 150 mg daily but he did not like the way it made him feel and could feel his heart beat in his chest and hear it in his ears. No dizziness or syncope.   Current Outpatient Prescriptions on File Prior to Visit  Medication Sig Dispense Refill  . cholecalciferol (VITAMIN D) 1000 UNITS tablet Take 1,000 Units by mouth daily.     . digoxin (LANOXIN) 0.125 MG tablet Take 0.5 tablets (62.5 mcg total) by mouth daily. 90 tablet 3  . diphenoxylate-atropine (LOMOTIL) 2.5-0.025 MG per tablet Take 2 tablets by mouth 2 (two) times daily as needed. For diarrhea     . fluorouracil (EFUDEX) 5 % cream Apply 1 application topically 2 (two) times daily.    . furosemide (LASIX) 40 MG tablet Take 1 tablet (40 mg total) by mouth daily. Take 40 mg alternating with 20 mg daily 90 tablet 3  . Multiple Vitamins-Minerals (MULTIVITAMINS THER. W/MINERALS) TABS Take 1 tablet by mouth daily.      . nitroGLYCERIN  (NITROSTAT) 0.4 MG SL tablet Place 1 tablet (0.4 mg total) under the tongue every 5 (five) minutes as needed for chest pain. 25 tablet 6  . PROAIR HFA 108 (90 BASE) MCG/ACT inhaler Inhale 2 puffs into the lungs every 4 (four) hours as needed for shortness of breath.     . psyllium (REGULOID) 0.52 G capsule Take 2 capsules by mouth daily.     . ramipril (ALTACE) 10 MG capsule Take 1 capsule by mouth daily.    . rosuvastatin (CRESTOR) 5 MG tablet Take 5 mg by mouth 2 (two) times a week.    . spironolactone (ALDACTONE) 25 MG tablet Take 1 tablet (25 mg total) by mouth daily. 90 tablet 11  . temazepam (RESTORIL) 7.5 MG capsule Take 1 capsule (7.5 mg total) by mouth at bedtime as needed for sleep. 30 capsule 0  . vitamin B-12 (CYANOCOBALAMIN) 1000 MCG tablet Take 1,000 mcg by mouth daily.     No current facility-administered medications on file prior to visit.    Allergies  Allergen Reactions  . Aspirin     Bleeding   . Iron     Stomach issues  . Lescol [Fluvastatin Sodium]     Dizziness  . Pentazocine Lactate     unknown  . Vytorin [Ezetimibe-Simvastatin]     Leg cramps     Past Medical History  Diagnosis Date  . Colon cancer ([place])     s/p chemo, RT, sugery  . Colorectal anastomotic stricture ([place])   . Cardiomyopathy     s/p ICD 09/30/08; EF 35 to 40% per echo in October 2021  . Iron deficiency anemia   . Diverticulosis   . ICD (implantable cardiac defibrillator) discharge October 2012    ICD shock for afib with RVR  . ED (erectile dysfunction)   . CAD (coronary artery disease)     Old inferolateral MI s/p CABG in 2002  . HTN (hypertension)   . Paroxysmal atrial fibrillation (H[facility])     controlled with amiodarone, not a coumadin candidate due to GI bleeding  . GERD (gastroesophageal reflux disease)     rarely  . CAP (community acquired pneumonia) 10/03/2011  . Chronic systolic CHF (congestive heart failure) ([place]) 07/24/2013    Past Surgical History  Procedure Laterality  Date  . Vasectomy    . Colon resection      x2  . Coronary artery bypass graft    . Cholecystectomy    . Back surgery    . Hernia repair    . Cardiac defibrillator placement  09/30/08    MDT dual chamber ICD placed by Dr Rayann Heman  . Cardiac catheterization  11/07/2000    EF 32%  . US echocardiography  08/11/2008    EF 30-35%  . Cardiovascular stress test  08/06/2008    EF 30%  . Appendectomy    . Ep implantable device N/A 10/15/2015    Procedure:  PPM Generator Changeout;  Surgeon: [name], MD;  Location: [place] [facility];  Service: Cardiovascular;  Laterality: N/A;    History  Smoking status  . Former Smoker  . Types: Cigars  Smokeless tobacco  . Never Used    History  Alcohol Use No    Family History  Problem Relation Age of Onset  . Colon cancer Father   . Diabetes Sister   . Colon polyps Brother   . Pancreatic cancer Brother 90    Review of Systems: The review of systems is per the HPI.    All other systems were reviewed and are negative.  Physical Exam: BP 108/72 mmHg  Pulse 109  Ht 5\' 11"  (1.803 m)  Wt 69.31 kg (152 lb 12.8 oz)  BMI 21.32 kg/m2 Patient is very pleasant and in no acute distress. Skin is warm and dry. Color is normal.  HEENT is unremarkable. Normocephalic/atraumatic. PERRL. Sclera are nonicteric. Neck is supple. No masses. JVD is down to 4 cm. Lungs are clear. Cardiac exam shows an irregular rate and rhythm. Normal S1 and S2. A999333 apical systolic murmur. Abdomen is soft. Extremities demonstrate 1+ edema. Gait and ROM are intact. No gross neurologic deficits noted.   LABORATORY DATA:  Lab Results  Component Value Date   WBC 7.4 12/22/2015   HGB 13.8 12/22/2015   HCT 43.8 12/22/2015   PLT 204 12/22/2015   GLUCOSE 74 02/25/2016   ALT 35 12/22/2015   AST 53* 12/22/2015   NA 139 02/25/2016   K 4.5 02/25/2016   CL 104 02/25/2016   CREATININE 1.12* 02/25/2016   BUN 18 02/25/2016   CO2 25 02/25/2016   TSH 1.87 07/26/2012   INR 1.18  10/02/2011   HGBA1C  06/11/2007    5.7 (NOTE)   The ADA recommends the following therapeutic goals for glycemic   control related to Hgb A1C measurement:   Goal of Therapy:   < 7.0%  Hgb A1C   Action Suggested:  > 8.0% Hgb A1C   Ref:  Diabetes Care, 22, Suppl. 1, 1999     Echo: 01/04/16:Study Conclusions  - Left ventricle: Inferior wall hypokinesis The cavity size was moderately dilated. Wall thickness was increased in a pattern of mild LVH. Systolic function was mildly to moderately reduced. The estimated ejection fraction was in the range of 40% to 45%. - Aortic valve: There was mild regurgitation. - Mitral valve: There was moderate regurgitation. - Left atrium: The atrium was moderately dilated. - Right ventricle: The cavity size was mildly dilated. - Right atrium: The atrium was mildly dilated. - Atrial septum: No defect or patent foramen ovale was identified. - Pulmonary arteries: PA peak pressure: 71 mm Hg (S).   Assessment / Plan: 1. Chronic systolic CHF. EF 40-45% by last Echo. Moderate MR. Pulmonary HTN. Volume status improved on aldactone and lasix.  On ACEi, Dig, metoprolol. Will use extra lasix prn for increased swelling or weight gain.   2. Atrial fibrillation. He has chronic Afib.  Not a candidate for anticoagulation due to major GI bleed history. Rate is well controlled at rest but some increased ventricular rates noted on device. Will increase metoprolol to 125 mg daily. Will assess rate with next routine device check. Since he is not a candidate for anticoagulation would not recommend cardioversion or ablation.  3. Ischemic cardiomyopathy as per # 1.   5. Coronary disease with old inferiolateral myocardial infarction. Status post CABG in 2002. Currently asymptomatic. Continue metoprolol. He is not a candidate for antiplatelet or anticoagulant therapy because of recurrent GI bleeding.  6. Hypertension, controlled.  7. Hypercholesterolemia.

## 2016-03-24 NOTE — Patient Instructions (Signed)
Take Toprol XL 100 mg daily and add metoprolol 25 mg daily  Continue your other therapy  I will see you in 2 months

## 2016-03-25 ENCOUNTER — Ambulatory Visit: Payer: Commercial Managed Care - HMO | Admitting: Physician Assistant

## 2016-03-25 ENCOUNTER — Other Ambulatory Visit: Payer: Self-pay | Admitting: *Deleted

## 2016-03-28 DIAGNOSIS — H5210 Myopia, unspecified eye: Secondary | ICD-10-CM | POA: Diagnosis not present

## 2016-03-28 DIAGNOSIS — H5340 Unspecified visual field defects: Secondary | ICD-10-CM | POA: Diagnosis not present

## 2016-03-28 DIAGNOSIS — H524 Presbyopia: Secondary | ICD-10-CM | POA: Diagnosis not present

## 2016-03-28 DIAGNOSIS — H35033 Hypertensive retinopathy, bilateral: Secondary | ICD-10-CM | POA: Diagnosis not present

## 2016-03-29 DIAGNOSIS — I2510 Atherosclerotic heart disease of native coronary artery without angina pectoris: Secondary | ICD-10-CM | POA: Diagnosis not present

## 2016-03-29 DIAGNOSIS — I255 Ischemic cardiomyopathy: Secondary | ICD-10-CM | POA: Diagnosis not present

## 2016-03-29 DIAGNOSIS — C189 Malignant neoplasm of colon, unspecified: Secondary | ICD-10-CM | POA: Diagnosis not present

## 2016-03-29 DIAGNOSIS — Z0000 Encounter for general adult medical examination without abnormal findings: Secondary | ICD-10-CM | POA: Diagnosis not present

## 2016-03-29 DIAGNOSIS — J449 Chronic obstructive pulmonary disease, unspecified: Secondary | ICD-10-CM | POA: Diagnosis not present

## 2016-03-29 DIAGNOSIS — I252 Old myocardial infarction: Secondary | ICD-10-CM | POA: Diagnosis not present

## 2016-03-29 DIAGNOSIS — I509 Heart failure, unspecified: Secondary | ICD-10-CM | POA: Diagnosis not present

## 2016-03-29 DIAGNOSIS — I480 Paroxysmal atrial fibrillation: Secondary | ICD-10-CM | POA: Diagnosis not present

## 2016-03-29 DIAGNOSIS — D508 Other iron deficiency anemias: Secondary | ICD-10-CM | POA: Diagnosis not present

## 2016-03-30 ENCOUNTER — Encounter: Payer: Commercial Managed Care - HMO | Admitting: Nurse Practitioner

## 2016-05-09 ENCOUNTER — Other Ambulatory Visit: Payer: Self-pay | Admitting: *Deleted

## 2016-05-09 MED ORDER — RAMIPRIL 10 MG PO CAPS: 10.0000 mg | ORAL_CAPSULE | Freq: Every day | ORAL | Status: DC

## 2016-05-09 NOTE — Telephone Encounter (Signed)
Rx request sent to pharmacy.  

## 2016-05-17 ENCOUNTER — Telehealth: Payer: Self-pay | Admitting: *Deleted

## 2016-05-17 MED ORDER — RAMIPRIL 10 MG PO CAPS: 10.0000 mg | ORAL_CAPSULE | Freq: Every day | ORAL | Status: DC

## 2016-05-17 NOTE — Telephone Encounter (Signed)
Spoke to pharmacy tech with [employer]She stated no interaction with Ramipril and Spironolactone.Stated they received refill for Ramipril last week and the reason not refilled it was too early.Refill will be processed 05/18/16.

## 2016-05-17 NOTE — Telephone Encounter (Signed)
Received a call from patient.Stated he has been out of Ramipril for 1 week.Stated he spoke with someone who was suppose to send in prescription,but was never sent in.30 day refill for Ramipril sent to [facility] on [address] and 90 day refill sent to [place].Apologized to patient.Advised to keep appointment with Dr.Jordan 05/23/16 at 9:30 am.

## 2016-05-17 NOTE — Telephone Encounter (Signed)
called to see if  Dr. [name] was aware that Ramipril & Spironolactone causes a drug interaction, please call [employer] back and discuss, [phone]

## 2016-05-23 ENCOUNTER — Ambulatory Visit (INDEPENDENT_AMBULATORY_CARE_PROVIDER_SITE_OTHER): Payer: Commercial Managed Care - HMO | Admitting: Cardiology

## 2016-05-23 ENCOUNTER — Encounter: Payer: Self-pay | Admitting: Cardiology

## 2016-05-23 VITALS — BP 105/72 | HR 100 | Ht 71.0 in | Wt 152.6 lb

## 2016-05-23 DIAGNOSIS — I2510 Atherosclerotic heart disease of native coronary artery without angina pectoris: Secondary | ICD-10-CM

## 2016-05-23 DIAGNOSIS — R001 Bradycardia, unspecified: Secondary | ICD-10-CM | POA: Diagnosis not present

## 2016-05-23 DIAGNOSIS — I5022 Chronic systolic (congestive) heart failure: Secondary | ICD-10-CM

## 2016-05-23 DIAGNOSIS — I482 Chronic atrial fibrillation, unspecified: Secondary | ICD-10-CM

## 2016-05-23 DIAGNOSIS — I255 Ischemic cardiomyopathy: Secondary | ICD-10-CM

## 2016-05-23 NOTE — Progress Notes (Signed)
[name] [name] Date of Birth: 04-Jul-1932 Medical Record [id number]  History of Present Illness: [name] [name] is seen back today for follow up of CAD and CHF. He has a history of coronary disease with remote inferior lateral myocardial infarction. He is status post CABG in 2002. He has an ischemic cardiomyopathy with ejection fraction of 35-40% by Echo in November 2012. He is s/p prior ICD implant with last change out in [facility] 2016. He also has a history of atrial fibrillation. In November 2016 he underwent change out of the ICD to a PPM only. He had only had ICD therapies for Afib with RVR before.  Earlier this year he had increased CHF. He was started on aldactone and lasix dose was increased. He had a brisk diuresis and improvement. He found it difficult to take lasix daily because it made him fell wiped out. Now taking every other day.  Edema is doing very well. Weight has been stable.  He is still SOB if he walks a long distance.  No chest pain. Prior attempts to increase beta blocker resulted in marked fatigue.  No dizziness or syncope.   Current Outpatient Prescriptions on File Prior to Visit  Medication Sig Dispense Refill  . cholecalciferol (VITAMIN D) 1000 UNITS tablet Take 1,000 Units by mouth daily.     . digoxin (LANOXIN) 0.125 MG tablet Take 0.5 tablets (62.5 mcg total) by mouth daily. 90 tablet 3  . diphenoxylate-atropine (LOMOTIL) 2.5-0.025 MG per tablet Take 2 tablets by mouth 2 (two) times daily as needed. For diarrhea     . fluorouracil (EFUDEX) 5 % cream Apply 1 application topically 2 (two) times daily.    . furosemide (LASIX) 40 MG tablet Take 1 tablet (40 mg total) by mouth daily. Take 40 mg alternating with 20 mg daily 90 tablet 3  . metoprolol (LOPRESSOR) 50 MG tablet Take 0.5 tablets (25 mg total) by mouth daily. 180 tablet 3  . metoprolol succinate (TOPROL-XL) 100 MG 24 hr tablet START 02/25/16.TAKE A TABLET AND HALF DAILY 100 IN AM 50 IN PM 90 tablet 3  . Multiple  Vitamins-Minerals (MULTIVITAMINS THER. W/MINERALS) TABS Take 1 tablet by mouth daily.      . nitroGLYCERIN (NITROSTAT) 0.4 MG SL tablet Place 1 tablet (0.4 mg total) under the tongue every 5 (five) minutes as needed for chest pain. 25 tablet 6  . PROAIR HFA 108 (90 BASE) MCG/ACT inhaler Inhale 2 puffs into the lungs every 4 (four) hours as needed for shortness of breath.     . psyllium (REGULOID) 0.52 G capsule Take 2 capsules by mouth daily.     . ramipril (ALTACE) 10 MG capsule Take 1 capsule (10 mg total) by mouth daily. 90 capsule 3  . rosuvastatin (CRESTOR) 5 MG tablet Take 5 mg by mouth 2 (two) times a week.    . spironolactone (ALDACTONE) 25 MG tablet Take 1 tablet (25 mg total) by mouth daily. 90 tablet 11  . temazepam (RESTORIL) 7.5 MG capsule Take 1 capsule (7.5 mg total) by mouth at bedtime as needed for sleep. 30 capsule 0  . vitamin B-12 (CYANOCOBALAMIN) 1000 MCG tablet Take 1,000 mcg by mouth daily.     No current facility-administered medications on file prior to visit.    Allergies  Allergen Reactions  . Aspirin     Bleeding   . Iron     Stomach issues  . Lescol [Fluvastatin Sodium]     Dizziness  . Pentazocine Lactate  unknown  . Vytorin [Ezetimibe-Simvastatin]     Leg cramps     Past Medical History  Diagnosis Date  . Colon cancer ([place])     s/p chemo, RT, sugery  . Colorectal anastomotic stricture ([place])   . Cardiomyopathy     s/p ICD 09/30/08; EF 35 to 40% per echo in October 2021  . Iron deficiency anemia   . Diverticulosis   . ICD (implantable cardiac defibrillator) discharge October 2012    ICD shock for afib with RVR  . ED (erectile dysfunction)   . CAD (coronary artery disease)     Old inferolateral MI s/p CABG in 2002  . HTN (hypertension)   . Paroxysmal atrial fibrillation (HCC)     controlled with amiodarone, not a coumadin candidate due to GI bleeding  . GERD (gastroesophageal reflux disease)     rarely  . CAP (community acquired pneumonia)  10/03/2011  . Chronic systolic CHF (congestive heart failure) ([place]) 07/24/2013    Past Surgical History  Procedure Laterality Date  . Vasectomy    . Colon resection      x2  . Coronary artery bypass graft    . Cholecystectomy    . Back surgery    . Hernia repair    . Cardiac defibrillator placement  09/30/08    MDT dual chamber ICD placed by Dr Rayann Heman  . Cardiac catheterization  11/07/2000    EF 32%  . US echocardiography  08/11/2008    EF 30-35%  . Cardiovascular stress test  08/06/2008    EF 30%  . Appendectomy    . Ep implantable device N/A 10/15/2015    Procedure:  PPM Generator Changeout;  Surgeon: [name], MD;  Location: [place] [facility];  Service: Cardiovascular;  Laterality: N/A;    History  Smoking status  . Former Smoker  . Types: Cigars  Smokeless tobacco  . Never Used    History  Alcohol Use No    Family History  Problem Relation Age of Onset  . Colon cancer Father   . Diabetes Sister   . Colon polyps Brother   . Pancreatic cancer Brother 87    Review of Systems: The review of systems is per the HPI.    All other systems were reviewed and are negative.  Physical Exam: BP 105/72 mmHg  Pulse 100  Ht 5\' 11"  (1.803 m)  Wt 152 lb 9.6 oz (69.219 kg)  BMI 21.29 kg/m2 Patient is very pleasant and in no acute distress. Skin is warm and dry. Color is normal.  HEENT is unremarkable. Normocephalic/atraumatic. PERRL. Sclera are nonicteric. Neck is supple. No masses. JVD is normal.  Lungs are clear. Cardiac exam shows an irregular rate and rhythm. Normal S1 and S2. A999333 apical systolic murmur. Abdomen is soft. Extremities demonstrate tr edema. Gait and ROM are intact. No gross neurologic deficits noted.   LABORATORY DATA:  Lab Results  Component Value Date   WBC 7.4 12/22/2015   HGB 13.8 12/22/2015   HCT 43.8 12/22/2015   PLT 204 12/22/2015   GLUCOSE 74 02/25/2016   ALT 35 12/22/2015   AST 53* 12/22/2015   NA 139 02/25/2016   K 4.5 02/25/2016   CL  104 02/25/2016   CREATININE 1.12* 02/25/2016   BUN 18 02/25/2016   CO2 25 02/25/2016   TSH 1.87 07/26/2012   INR 1.18 10/02/2011   HGBA1C  06/11/2007    5.7 (NOTE)   The ADA recommends the following therapeutic goals for glycemic  control related to Hgb A1C measurement:   Goal of Therapy:   < 7.0% Hgb A1C   Action Suggested:  > 8.0% Hgb A1C   Ref:  Diabetes Care, 22, Suppl. 1, 1999     Echo: 01/04/16:Study Conclusions  - Left ventricle: Inferior wall hypokinesis The cavity size was moderately dilated. Wall thickness was increased in a pattern of mild LVH. Systolic function was mildly to moderately reduced. The estimated ejection fraction was in the range of 40% to 45%. - Aortic valve: There was mild regurgitation. - Mitral valve: There was moderate regurgitation. - Left atrium: The atrium was moderately dilated. - Right ventricle: The cavity size was mildly dilated. - Right atrium: The atrium was mildly dilated. - Atrial septum: No defect or patent foramen ovale was identified. - Pulmonary arteries: PA peak pressure: 71 mm Hg (S).   Assessment / Plan: 1. Chronic systolic CHF. EF 40-45% by last Echo. Moderate MR. Pulmonary HTN. Volume status improved on aldactone and lasix.  On ACEi, Dig, metoprolol. Will use extra lasix prn for increased swelling or weight gain.   2. Atrial fibrillation. He has chronic Afib.  Not a candidate for anticoagulation due to major GI bleed history. Rate is well controlled at rest.  Will continuemetoprolol to 125 mg daily. Will assess rate with next routine device check. Since he is not a candidate for anticoagulation would not recommend cardioversion or ablation.  3. Ischemic cardiomyopathy as per # 1.   4. Coronary disease with old inferiolateral myocardial infarction. Status post CABG in 2002. Currently asymptomatic. Continue metoprolol. He is not a candidate for antiplatelet or anticoagulant therapy because of recurrent GI bleeding.  5.  Hypertension, controlled.  6. Hypercholesterolemia.

## 2016-05-23 NOTE — Patient Instructions (Signed)
Continue your current therapy  I will see you in 3-4 months  

## 2016-06-23 ENCOUNTER — Ambulatory Visit (INDEPENDENT_AMBULATORY_CARE_PROVIDER_SITE_OTHER): Payer: Commercial Managed Care - HMO | Admitting: *Deleted

## 2016-06-23 ENCOUNTER — Encounter: Payer: Self-pay | Admitting: Internal Medicine

## 2016-06-23 DIAGNOSIS — Z950 Presence of cardiac pacemaker: Secondary | ICD-10-CM | POA: Diagnosis not present

## 2016-06-23 DIAGNOSIS — I482 Chronic atrial fibrillation, unspecified: Secondary | ICD-10-CM

## 2016-06-23 LAB — CUP PACEART INCLINIC DEVICE CHECK
Battery Remaining Longevity: 127.2
Battery Voltage: 3.04 V
Date Time Interrogation Session: 20170727094942
Implantable Lead Location: 753860
Implantable Lead Model: 6947
Lead Channel Pacing Threshold Amplitude: 1 V
Lead Channel Pacing Threshold Pulse Width: 0.4 ms
Lead Channel Setting Pacing Pulse Width: 0.4 ms
Lead Channel Setting Sensing Sensitivity: 2 mV
MDC IDC LEAD IMPLANT DT: 20091103
MDC IDC LEAD IMPLANT DT: 20091103
MDC IDC LEAD LOCATION: 753859
MDC IDC MSMT LEADCHNL RV IMPEDANCE VALUE: 462.5 Ohm
MDC IDC MSMT LEADCHNL RV PACING THRESHOLD AMPLITUDE: 1 V
MDC IDC MSMT LEADCHNL RV PACING THRESHOLD PULSEWIDTH: 0.4 ms
MDC IDC MSMT LEADCHNL RV SENSING INTR AMPL: 12 mV
MDC IDC PG SERIAL: 7804513
MDC IDC SET LEADCHNL RV PACING AMPLITUDE: 2.5 V
MDC IDC STAT BRADY RV PERCENT PACED: 1.6 %
Pulse Gen Model: 1240

## 2016-06-23 NOTE — Progress Notes (Signed)
Pacemaker check in clinic. Normal device function. Threshold, sensing, impedance consistent with previous measurements. Device programmed to maximize longevity. 14 high ventricular rates noted- EGMs show AF with RVR, rates 160s. Not a candidate for oral anticoagulation. Mr. Dethomas reports that he feels better since Dr. [name] resumed the lower dose of Metoprolol; more walking stability and decreased SOB with activity. Device programmed at appropriate safety margins. Histogram distribution appropriate for patient activity level. Device programmed to optimize intrinsic conduction. Estimated longevity 10.6-11.3 years. Patient enrolled in remote follow-up. Merlin 09/22/16, ROV with JA in March.

## 2016-09-02 DIAGNOSIS — M859 Disorder of bone density and structure, unspecified: Secondary | ICD-10-CM | POA: Diagnosis not present

## 2016-09-02 DIAGNOSIS — E559 Vitamin D deficiency, unspecified: Secondary | ICD-10-CM | POA: Diagnosis not present

## 2016-09-15 NOTE — Progress Notes (Signed)
[name] Date of Birth: 1932-01-18 Medical Record #[id number]  History of Present Illness: [name] [name] is seen back today for follow up of CAD and CHF. He has a history of coronary disease with remote inferior lateral myocardial infarction. He is status post CABG in 2002. He has an ischemic cardiomyopathy with ejection fraction of 35-40% by Echo in November 2012. He is s/p prior ICD implant with last change out in [facility] 2016. He also has a history of atrial fibrillation. In November 2016 he underwent change out of the ICD to a PPM only. He had only had ICD therapies for Afib with RVR before.   Earlier this year he had increased CHF. He was started on aldactone and lasix dose was increased. He had a brisk diuresis and improvement. He found it difficult to take lasix daily because it made him fell wiped out. Now taking 20 mg daily except for 40 mg two days a week.  Edema is doing very well. Weight has increased but no increased edema or SOB.  He thinks his weight gain is from eating more and less active. He is still SOB if he walks a long distance.  No chest pain. Prior attempts to increase beta blocker resulted in marked fatigue.  No dizziness or syncope.   Current Outpatient Prescriptions on File Prior to Visit  Medication Sig Dispense Refill  . cholecalciferol (VITAMIN D) 1000 UNITS tablet Take 1,000 Units by mouth daily.     . digoxin (LANOXIN) 0.125 MG tablet Take 0.5 tablets (62.5 mcg total) by mouth daily. 90 tablet 3  . diphenoxylate-atropine (LOMOTIL) 2.5-0.025 MG per tablet Take 2 tablets by mouth 2 (two) times daily as needed. For diarrhea     . fluorouracil (EFUDEX) 5 % cream Apply 1 application topically 2 (two) times daily.    . furosemide (LASIX) 40 MG tablet Take 1 tablet (40 mg total) by mouth daily. Take 40 mg alternating with 20 mg daily 90 tablet 3  . metoprolol (LOPRESSOR) 50 MG tablet Take 0.5 tablets (25 mg total) by mouth daily. 180 tablet 3  . metoprolol succinate  (TOPROL-XL) 100 MG 24 hr tablet START 02/25/16.TAKE A TABLET AND HALF DAILY 100 IN AM 50 IN PM 90 tablet 3  . Multiple Vitamins-Minerals (MULTIVITAMINS THER. W/MINERALS) TABS Take 1 tablet by mouth daily.      . nitroGLYCERIN (NITROSTAT) 0.4 MG SL tablet Place 1 tablet (0.4 mg total) under the tongue every 5 (five) minutes as needed for chest pain. 25 tablet 6  . PROAIR HFA 108 (90 BASE) MCG/ACT inhaler Inhale 2 puffs into the lungs every 4 (four) hours as needed for shortness of breath.     . psyllium (REGULOID) 0.52 G capsule Take 2 capsules by mouth daily.     . ramipril (ALTACE) 10 MG capsule Take 1 capsule (10 mg total) by mouth daily. 90 capsule 3  . rosuvastatin (CRESTOR) 5 MG tablet Take 5 mg by mouth 2 (two) times a week.    . spironolactone (ALDACTONE) 25 MG tablet Take 1 tablet (25 mg total) by mouth daily. 90 tablet 11  . temazepam (RESTORIL) 7.5 MG capsule Take 1 capsule (7.5 mg total) by mouth at bedtime as needed for sleep. 30 capsule 0  . vitamin B-12 (CYANOCOBALAMIN) 1000 MCG tablet Take 1,000 mcg by mouth daily.     No current facility-administered medications on file prior to visit.     Allergies  Allergen Reactions  . Aspirin     Bleeding   .  Iron     Stomach issues  . Lescol [Fluvastatin Sodium]     Dizziness  . Pentazocine Lactate     unknown  . Vytorin [Ezetimibe-Simvastatin]     Leg cramps     Past Medical History:  Diagnosis Date  . CAD (coronary artery disease)    Old inferolateral MI s/p CABG in 2002  . CAP (community acquired pneumonia) 10/03/2011  . Cardiomyopathy    s/p ICD 09/30/08; EF 35 to 40% per echo in October 2021  . Chronic systolic CHF (congestive heart failure) ([place]) 07/24/2013  . Colon cancer ([place])    s/p chemo, RT, sugery  . Colorectal anastomotic stricture   . Diverticulosis   . ED (erectile dysfunction)   . GERD (gastroesophageal reflux disease)    rarely  . HTN (hypertension)   . ICD (implantable cardiac defibrillator) discharge  October 2012   ICD shock for afib with RVR  . Iron deficiency anemia   . Paroxysmal atrial fibrillation (HCC)    controlled with amiodarone, not a coumadin candidate due to GI bleeding    Past Surgical History:  Procedure Laterality Date  . APPENDECTOMY    . BACK SURGERY    . CARDIAC CATHETERIZATION  11/07/2000   EF 32%  . CARDIAC DEFIBRILLATOR PLACEMENT  09/30/08   MDT dual chamber ICD placed by Dr Rayann Heman  . CARDIOVASCULAR STRESS TEST  08/06/2008   EF 30%  . CHOLECYSTECTOMY    . COLON RESECTION     x2  . CORONARY ARTERY BYPASS GRAFT    . EP IMPLANTABLE DEVICE N/A 10/15/2015   Procedure:  PPM Generator Changeout;  Surgeon: [name], MD;  Location: [facility];  Service: Cardiovascular;  Laterality: N/A;  . HERNIA REPAIR    . US ECHOCARDIOGRAPHY  08/11/2008   EF 30-35%  . VASECTOMY      History  Smoking Status  . Former Smoker  . Types: Cigars  Smokeless Tobacco  . Never Used    History  Alcohol Use No    Family History  Problem Relation Age of Onset  . Colon cancer Father   . Diabetes Sister   . Colon polyps Brother   . Pancreatic cancer Brother 47    Review of Systems: The review of systems is per the HPI.    All other systems were reviewed and are negative.  Physical Exam: BP 100/70 (BP Location: Right Arm, Patient Position: Sitting, Cuff Size: Normal)   Pulse 84   Ht 5\' 11"  (1.803 m)   Wt 159 lb 6.4 oz (72.3 kg)   SpO2 90%   BMI 22.23 kg/m  Patient is very pleasant and in no acute distress. Skin is warm and dry. Color is normal.  HEENT is unremarkable. Normocephalic/atraumatic. PERRL. Sclera are nonicteric. Neck is supple. No masses. JVD is normal.  Lungs are clear. Cardiac exam shows an irregular rate and rhythm. Normal S1 and S2. 6-2/3 apical systolic murmur. Abdomen is soft. Extremities demonstrate tr edema. Gait and ROM are intact. No gross neurologic deficits noted.   LABORATORY DATA:  Lab Results  Component Value Date   WBC 7.4  12/22/2015   HGB 13.8 12/22/2015   HCT 43.8 12/22/2015   PLT 204 12/22/2015   GLUCOSE 74 02/25/2016   ALT 35 12/22/2015   AST 53 (H) 12/22/2015   NA 139 02/25/2016   K 4.5 02/25/2016   CL 104 02/25/2016   CREATININE 1.12 (H) 02/25/2016   BUN 18 02/25/2016   CO2 25  02/25/2016   TSH 1.87 07/26/2012   INR 1.18 10/02/2011   HGBA1C  06/11/2007    5.7 (NOTE)   The ADA recommends the following therapeutic goals for glycemic   control related to Hgb A1C measurement:   Goal of Therapy:   < 7.0% Hgb A1C   Action Suggested:  > 8.0% Hgb A1C   Ref:  Diabetes Care, 22, Suppl. 1, 1999   Dig level January 2017- 0.6  Labs dated 03/22/16: cholesterol 152, triglycerides 43, HDL 48, LDL 95. BUN 23, creatinine 1.1.   Echo: 01/04/16:Study Conclusions  - Left ventricle: Inferior wall hypokinesis The cavity size was moderately dilated. Wall thickness was increased in a pattern of mild LVH. Systolic function was mildly to moderately reduced. The estimated ejection fraction was in the range of 40% to 45%. - Aortic valve: There was mild regurgitation. - Mitral valve: There was moderate regurgitation. - Left atrium: The atrium was moderately dilated. - Right ventricle: The cavity size was mildly dilated. - Right atrium: The atrium was mildly dilated. - Atrial septum: No defect or patent foramen ovale was identified. - Pulmonary arteries: PA peak pressure: 71 mm Hg (S).   Assessment / Plan: 1. Chronic systolic CHF. EF 40-45% by last Echo. Moderate MR. Pulmonary HTN. Volume status improved on aldactone and lasix.  On ACEi, Dig, metoprolol. Will use extra lasix prn for increased swelling or weight gain. He currently looks well compensated.  2. Atrial fibrillation. He has chronic Afib.  Not a candidate for anticoagulation due to major GI bleed history. Rate is well controlled at rest.  Will continue metoprolol to 125 mg daily and digoxin. Will assess rate with next routine device check. Since he is not a  candidate for anticoagulation would not recommend cardioversion or ablation.  3. Ischemic cardiomyopathy as per # 1.   4. Coronary disease with old inferiolateral myocardial infarction. Status post CABG in 2002. Currently asymptomatic. Continue metoprolol. He is not a candidate for antiplatelet or anticoagulant therapy because of recurrent GI bleeding.  5. Hypertension, controlled.  6. Hypercholesterolemia.

## 2016-09-16 ENCOUNTER — Ambulatory Visit (INDEPENDENT_AMBULATORY_CARE_PROVIDER_SITE_OTHER): Payer: Commercial Managed Care - HMO | Admitting: Cardiology

## 2016-09-16 ENCOUNTER — Encounter: Payer: Self-pay | Admitting: Cardiology

## 2016-09-16 VITALS — BP 100/70 | HR 84 | Ht 71.0 in | Wt 159.4 lb

## 2016-09-16 DIAGNOSIS — I10 Essential (primary) hypertension: Secondary | ICD-10-CM | POA: Diagnosis not present

## 2016-09-16 DIAGNOSIS — I2510 Atherosclerotic heart disease of native coronary artery without angina pectoris: Secondary | ICD-10-CM

## 2016-09-16 DIAGNOSIS — I482 Chronic atrial fibrillation, unspecified: Secondary | ICD-10-CM

## 2016-09-16 DIAGNOSIS — I5022 Chronic systolic (congestive) heart failure: Secondary | ICD-10-CM

## 2016-09-16 NOTE — Patient Instructions (Signed)
Continue your current therapy  I will see you in 6  Months   

## 2016-09-22 ENCOUNTER — Ambulatory Visit (INDEPENDENT_AMBULATORY_CARE_PROVIDER_SITE_OTHER): Payer: Commercial Managed Care - HMO | Admitting: *Deleted

## 2016-09-22 DIAGNOSIS — I255 Ischemic cardiomyopathy: Secondary | ICD-10-CM

## 2016-09-22 NOTE — Progress Notes (Signed)
Remote pacemaker transmission.   

## 2016-09-29 DIAGNOSIS — I10 Essential (primary) hypertension: Secondary | ICD-10-CM | POA: Diagnosis not present

## 2016-09-29 DIAGNOSIS — I2510 Atherosclerotic heart disease of native coronary artery without angina pectoris: Secondary | ICD-10-CM | POA: Diagnosis not present

## 2016-09-29 DIAGNOSIS — R7301 Impaired fasting glucose: Secondary | ICD-10-CM | POA: Diagnosis not present

## 2016-09-29 DIAGNOSIS — I509 Heart failure, unspecified: Secondary | ICD-10-CM | POA: Diagnosis not present

## 2016-09-29 DIAGNOSIS — J449 Chronic obstructive pulmonary disease, unspecified: Secondary | ICD-10-CM | POA: Diagnosis not present

## 2016-09-29 DIAGNOSIS — I480 Paroxysmal atrial fibrillation: Secondary | ICD-10-CM | POA: Diagnosis not present

## 2016-09-29 DIAGNOSIS — Z6822 Body mass index (BMI) 22.0-22.9, adult: Secondary | ICD-10-CM | POA: Diagnosis not present

## 2016-09-30 ENCOUNTER — Encounter: Payer: Self-pay | Admitting: Cardiology

## 2016-10-20 LAB — CUP PACEART REMOTE DEVICE CHECK
Battery Remaining Percentage: 95 %
Battery Voltage: 3.04 V
Date Time Interrogation Session: 20171123120250
Implantable Lead Implant Date: 20091103
Implantable Lead Location: 753860
Lead Channel Setting Pacing Amplitude: 2.5 V
Lead Channel Setting Sensing Sensitivity: 2 mV
MDC IDC LEAD IMPLANT DT: 20091103
MDC IDC LEAD LOCATION: 753859
MDC IDC MSMT BATTERY REMAINING LONGEVITY: 127 mo
MDC IDC MSMT LEADCHNL RV IMPEDANCE VALUE: 510 Ohm
MDC IDC MSMT LEADCHNL RV SENSING INTR AMPL: 12 mV
MDC IDC PG IMPLANT DT: 20161117
MDC IDC PG SERIAL: 7804513
MDC IDC SET LEADCHNL RV PACING PULSEWIDTH: 0.4 ms
MDC IDC STAT BRADY RV PERCENT PACED: 4 %
Pulse Gen Model: 1240

## 2016-11-22 ENCOUNTER — Other Ambulatory Visit: Payer: Self-pay | Admitting: Internal Medicine

## 2016-12-22 ENCOUNTER — Ambulatory Visit (INDEPENDENT_AMBULATORY_CARE_PROVIDER_SITE_OTHER): Payer: Medicare HMO | Admitting: *Deleted

## 2016-12-22 DIAGNOSIS — I255 Ischemic cardiomyopathy: Secondary | ICD-10-CM | POA: Diagnosis not present

## 2016-12-22 DIAGNOSIS — I428 Other cardiomyopathies: Secondary | ICD-10-CM

## 2016-12-22 LAB — CUP PACEART REMOTE DEVICE CHECK
Battery Voltage: 3.04 V
Date Time Interrogation Session: 20180125171535
Implantable Lead Implant Date: 20091103
Implantable Lead Location: 753859
Implantable Lead Model: 5076
Implantable Pulse Generator Implant Date: 20161117
Lead Channel Impedance Value: 530 Ohm
Lead Channel Setting Pacing Amplitude: 2.5 V
Lead Channel Setting Sensing Sensitivity: 2 mV
MDC IDC LEAD IMPLANT DT: 20091103
MDC IDC LEAD LOCATION: 753860
MDC IDC PG SERIAL: 7804513
MDC IDC SET LEADCHNL RV PACING PULSEWIDTH: 0.4 ms
MDC IDC STAT BRADY RV PERCENT PACED: 5 %
Pulse Gen Model: 1240

## 2016-12-22 NOTE — Progress Notes (Signed)
Remote pacemaker transmission.   

## 2016-12-23 ENCOUNTER — Encounter: Payer: Self-pay | Admitting: Cardiology

## 2017-02-17 ENCOUNTER — Encounter: Payer: Self-pay | Admitting: Internal Medicine

## 2017-02-17 ENCOUNTER — Ambulatory Visit (INDEPENDENT_AMBULATORY_CARE_PROVIDER_SITE_OTHER): Payer: Medicare HMO | Admitting: Internal Medicine

## 2017-02-17 VITALS — BP 100/60 | HR 93 | Ht 69.0 in | Wt 156.0 lb

## 2017-02-17 DIAGNOSIS — Z95810 Presence of automatic (implantable) cardiac defibrillator: Secondary | ICD-10-CM | POA: Diagnosis not present

## 2017-02-17 DIAGNOSIS — I5022 Chronic systolic (congestive) heart failure: Secondary | ICD-10-CM

## 2017-02-17 DIAGNOSIS — R001 Bradycardia, unspecified: Secondary | ICD-10-CM

## 2017-02-17 DIAGNOSIS — I255 Ischemic cardiomyopathy: Secondary | ICD-10-CM | POA: Diagnosis not present

## 2017-02-17 LAB — CUP PACEART INCLINIC DEVICE CHECK
Battery Voltage: 3.04 V
Date Time Interrogation Session: 20180323152502
Implantable Lead Implant Date: 20091103
Implantable Lead Implant Date: 20091103
Implantable Lead Location: 753859
Lead Channel Setting Pacing Pulse Width: 0.4 ms
Lead Channel Setting Sensing Sensitivity: 2 mV
MDC IDC LEAD LOCATION: 753860
MDC IDC MSMT LEADCHNL RV IMPEDANCE VALUE: 525 Ohm
MDC IDC MSMT LEADCHNL RV PACING THRESHOLD AMPLITUDE: 0.75 V
MDC IDC MSMT LEADCHNL RV PACING THRESHOLD PULSEWIDTH: 0.4 ms
MDC IDC MSMT LEADCHNL RV SENSING INTR AMPL: 12 mV
MDC IDC PG IMPLANT DT: 20161117
MDC IDC SET LEADCHNL RV PACING AMPLITUDE: 2.5 V
MDC IDC STAT BRADY RV PERCENT PACED: 5.1 %
Pulse Gen Model: 1240
Pulse Gen Serial Number: 7804513

## 2017-02-17 NOTE — Progress Notes (Signed)
PCP:  Jerlyn Ly, MD Primary Cardiologist:  Dr [name]  The patient presents today for routine electrophysiology followup.  He is doing well.  Today, he denies symptoms of palpitations, chest pain, shortness of breath,  lower extremity edema, dizziness, presyncope, syncope, or ICD shocks.  The patient feels that he is tolerating medications without difficulties and is otherwise without complaint today.   Past Medical History:  Diagnosis Date  . CAD (coronary artery disease)    Old inferolateral MI s/p CABG in 2002  . CAP (community acquired pneumonia) 10/03/2011  . Cardiomyopathy    s/p ICD 09/30/08; EF 35 to 40% per echo in October 2021  . Chronic systolic CHF (congestive heart failure) ([place]) 07/24/2013  . Colon cancer ([place])    s/p chemo, RT, sugery  . Colorectal anastomotic stricture   . Diverticulosis   . ED (erectile dysfunction)   . GERD (gastroesophageal reflux disease)    rarely  . HTN (hypertension)   . ICD (implantable cardiac defibrillator) discharge October 2012   ICD shock for afib with RVR  . Iron deficiency anemia   . Paroxysmal atrial fibrillation ([facility])    controlled with amiodarone, not a coumadin candidate due to GI bleeding   Past Surgical History:  Procedure Laterality Date  . APPENDECTOMY    . BACK SURGERY    . CARDIAC CATHETERIZATION  11/07/2000   EF 32%  . CARDIAC DEFIBRILLATOR PLACEMENT  09/30/08   MDT dual chamber ICD placed by Dr Rayann Heman  . CARDIOVASCULAR STRESS TEST  08/06/2008   EF 30%  . CHOLECYSTECTOMY    . COLON RESECTION     x2  . CORONARY ARTERY BYPASS GRAFT    . EP IMPLANTABLE DEVICE N/A 10/15/2015   Procedure:  PPM Generator Changeout;  Surgeon: [name], MD;  Location: [facility];  Service: Cardiovascular;  Laterality: N/A;  . HERNIA REPAIR    . US ECHOCARDIOGRAPHY  08/11/2008   EF 30-35%  . VASECTOMY      Current Outpatient Prescriptions  Medication Sig Dispense Refill  . cholecalciferol (VITAMIN D) 1000 UNITS tablet  Take 1,000 Units by mouth daily.     . digoxin (LANOXIN) 0.125 MG tablet Take 0.5 tablets (62.5 mcg total) by mouth daily. 90 tablet 3  . diphenoxylate-atropine (LOMOTIL) 2.5-0.025 MG per tablet Take 2 tablets by mouth 2 (two) times daily as needed. For diarrhea     . fluorouracil (EFUDEX) 5 % cream Apply 1 application topically 2 (two) times daily.    . furosemide (LASIX) 40 MG tablet Take 1 tablet (40 mg total) by mouth daily. Take 40 mg alternating with 20 mg daily 90 tablet 3  . metoprolol (LOPRESSOR) 50 MG tablet Take 0.5 tablets (25 mg total) by mouth daily. 180 tablet 3  . metoprolol succinate (TOPROL-XL) 100 MG 24 hr tablet Take 1 tablet (100 mg total) by mouth daily. 90 tablet 3  . Multiple Vitamins-Minerals (MULTIVITAMINS THER. W/MINERALS) TABS Take 1 tablet by mouth daily.      . nitroGLYCERIN (NITROSTAT) 0.4 MG SL tablet Place 1 tablet (0.4 mg total) under the tongue every 5 (five) minutes as needed for chest pain. 25 tablet 6  . PROAIR HFA 108 (90 BASE) MCG/ACT inhaler Inhale 2 puffs into the lungs every 4 (four) hours as needed for shortness of breath.     . psyllium (REGULOID) 0.52 G capsule Take 2 capsules by mouth daily.     . ramipril (ALTACE) 10 MG capsule Take 1 capsule (10  mg total) by mouth daily. 90 capsule 3  . rosuvastatin (CRESTOR) 5 MG tablet Take 5 mg by mouth 2 (two) times a week.    . spironolactone (ALDACTONE) 25 MG tablet Take 1 tablet (25 mg total) by mouth daily. 90 tablet 11  . temazepam (RESTORIL) 7.5 MG capsule Take 1 capsule (7.5 mg total) by mouth at bedtime as needed for sleep. 30 capsule 0  . vitamin B-12 (CYANOCOBALAMIN) 1000 MCG tablet Take 1,000 mcg by mouth daily.     No current facility-administered medications for this visit.     Allergies  Allergen Reactions  . Aspirin     Bleeding   . Iron     Stomach issues  . Lescol [Fluvastatin Sodium]     Dizziness  . Pentazocine Lactate     unknown  . Vytorin [Ezetimibe-Simvastatin]     Leg  cramps     Social History   Social History  . Marital status: Married    Spouse name: N/A  . Number of children: 2  . Years of education: N/A   Occupational History  . Retired  Retired   Social History Main Topics  . Smoking status: Former Smoker    Types: Cigars  . Smokeless tobacco: Never Used  . Alcohol use No  . Drug use: No  . Sexual activity: Not on file   Other Topics Concern  . Not on file   Social History Narrative   Lives in [place] with spouse.  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 Colon cancer Father   . Diabetes Sister   . Colon polyps Brother   . Pancreatic cancer Brother 7    ROS-  All systems are reviewed and are negative except as outlined in the HPI above   Physical Exam: Vitals:   02/17/17 1416  BP: 100/60  Pulse: 93  SpO2: 98%  Weight: 156 lb (70.8 kg)  Height: 5\' 9"  (1.753 m)    GEN- The patient is elderly appearing, alert and oriented x 3 today.   Head- normocephalic, atraumatic Eyes-  Sclera clear, conjunctiva pink Ears- hearing intact Oropharynx- clear Neck- supple,  Lungs- Clear to ausculation bilaterally, normal work of breathing Chest- ICD pocket is well healed Heart- irregular rhythm,  GI- soft, NT, ND, + BS Extremities- no clubbing, cyanosis, or edema Skin- diffuse ecchymosis on his hands  PPM  interrogation- personally reviewed in detail today,  See PACEART report  ekg today reveals afib, V rate 93 bpm, nonspecific ST/T changes  Assessment and Plan:  1. Chronic systolic dysfunction euvolemic today  2. Permanent atrial fibrillation Not a candidate for anticoagualtion or watchman Histogram reveals reasonable rate control He says he feels well and does not wish to make changes  3. CAD No ischemic symptoms  Merlin Return to see EP NP every year for device  follow-up Follow-up with Dr [name] as scheduled  [name] MD, [facility] 02/17/2017 2:51 PM

## 2017-02-17 NOTE — Patient Instructions (Signed)
Medication Instructions:    Your physician recommends that you continue on your current medications as directed. Please refer to the Current Medication list given to you today.  --- If you need a refill on your cardiac medications before your next appointment, please call your pharmacy. ---  Labwork:  None ordered  Testing/Procedures:  None ordered  Follow-Up: Remote monitoring is used to monitor your Pacemaker of ICD from home. This monitoring reduces the number of office visits required to check your device to one time per year. It allows [place] to keep an eye on the functioning of your device to ensure it is working properly. You are scheduled for a device check from home on 05/22/2017. You may send your transmission at any time that day. If you have a wireless device, the transmission will be sent automatically. After your physician reviews your transmission, you will receive a postcard with your next transmission date.   Your physician wants you to follow-up in: 1 year with Chanetta Marshall, NP.  You will receive a reminder letter in the mail two months in advance. If you don't receive a letter, please call our office to schedule the follow-up appointment.  Thank you for choosing [facility]!!   Trinidad Curet, RN [phone]

## 2017-03-15 ENCOUNTER — Telehealth: Payer: Self-pay | Admitting: Family Medicine

## 2017-03-15 NOTE — Telephone Encounter (Deleted)
Patient called to see if you could call her in something for her nausea and a new antibiotic since it is causing dizziness, nausea and diarrhea.    Thanks  She is requesting the tabs that dissolve under the tongue---ondanstonn- ODT 4mg  for the nausea

## 2017-03-15 NOTE — Progress Notes (Signed)
[name] [name] Date of Birth: September 02, 1932 Medical Record #[id number]  History of Present Illness: [name] [name] is seen back today for follow up of CAD and CHF. He has a history of coronary disease with remote inferior lateral myocardial infarction. He is status post CABG in 2002. He has an ischemic cardiomyopathy with ejection fraction of 35-40% by Echo in November 2012. He is s/p prior ICD implant with last change out in [facility] 2016. He also has a history of atrial fibrillation. In November 2016 he underwent change out of the ICD to a PPM only. He had only had ICD therapies for Afib with RVR before.   Earlier this year he had increased CHF. He was started on aldactone and lasix dose was increased. He had a brisk diuresis and improvement. He found it difficult to take lasix daily because it made him fell wiped out. Now taking 20 mg daily except for 40 mg about once a week.  Edema is doing very well. Weight is down 5 lbs since October.  He denies any significant dyspnea. Still works 2-3 days/week.   No chest pain. Prior attempts to increase beta blocker resulted in marked fatigue.  No dizziness or syncope.   He was seen by Dr. Rayann Heman in March 2018. ICD device was functioning normally. Rate control was felt to be reasonably good.   Current Outpatient Prescriptions on File Prior to Visit  Medication Sig Dispense Refill  . cholecalciferol (VITAMIN D) 1000 UNITS tablet Take 1,000 Units by mouth daily.     . digoxin (LANOXIN) 0.125 MG tablet Take 0.5 tablets (62.5 mcg total) by mouth daily. 90 tablet 3  . diphenoxylate-atropine (LOMOTIL) 2.5-0.025 MG per tablet Take 2 tablets by mouth 2 (two) times daily as needed. For diarrhea     . fluorouracil (EFUDEX) 5 % cream Apply 1 application topically 2 (two) times daily.    . furosemide (LASIX) 40 MG tablet Take 1 tablet (40 mg total) by mouth daily. Take 40 mg alternating with 20 mg daily 90 tablet 3  . metoprolol (LOPRESSOR) 50 MG tablet Take 0.5 tablets (25 mg  total) by mouth daily. 180 tablet 3  . metoprolol succinate (TOPROL-XL) 100 MG 24 hr tablet Take 1 tablet (100 mg total) by mouth daily. 90 tablet 3  . Multiple Vitamins-Minerals (MULTIVITAMINS THER. W/MINERALS) TABS Take 1 tablet by mouth daily.      . nitroGLYCERIN (NITROSTAT) 0.4 MG SL tablet Place 1 tablet (0.4 mg total) under the tongue every 5 (five) minutes as needed for chest pain. 25 tablet 6  . PROAIR HFA 108 (90 BASE) MCG/ACT inhaler Inhale 2 puffs into the lungs every 4 (four) hours as needed for shortness of breath.     . psyllium (REGULOID) 0.52 G capsule Take 2 capsules by mouth daily.     . ramipril (ALTACE) 10 MG capsule Take 1 capsule (10 mg total) by mouth daily. 90 capsule 3  . rosuvastatin (CRESTOR) 5 MG tablet Take 5 mg by mouth 2 (two) times a week.    . spironolactone (ALDACTONE) 25 MG tablet Take 1 tablet (25 mg total) by mouth daily. 90 tablet 11  . temazepam (RESTORIL) 7.5 MG capsule Take 1 capsule (7.5 mg total) by mouth at bedtime as needed for sleep. 30 capsule 0  . vitamin B-12 (CYANOCOBALAMIN) 1000 MCG tablet Take 1,000 mcg by mouth daily.     No current facility-administered medications on file prior to visit.     Allergies  Allergen Reactions  .  Aspirin     Bleeding   . Iron     Stomach issues  . Lescol [Fluvastatin Sodium]     Dizziness  . Pentazocine Lactate     unknown  . Vytorin [Ezetimibe-Simvastatin]     Leg cramps     Past Medical History:  Diagnosis Date  . CAD (coronary artery disease)    Old inferolateral MI s/p CABG in 2002  . CAP (community acquired pneumonia) 10/03/2011  . Cardiomyopathy    s/p ICD 09/30/08; EF 35 to 40% per echo in October 2021  . Chronic systolic CHF (congestive heart failure) ([place]) 07/24/2013  . Colon cancer ([place])    s/p chemo, RT, sugery  . Colorectal anastomotic stricture   . Diverticulosis   . ED (erectile dysfunction)   . GERD (gastroesophageal reflux disease)    rarely  . HTN (hypertension)   . ICD  (implantable cardiac defibrillator) discharge October 2012   ICD shock for afib with RVR  . Iron deficiency anemia   . Paroxysmal atrial fibrillation (HCC)    controlled with amiodarone, not a coumadin candidate due to GI bleeding    Past Surgical History:  Procedure Laterality Date  . APPENDECTOMY    . BACK SURGERY    . CARDIAC CATHETERIZATION  11/07/2000   EF 32%  . CARDIAC DEFIBRILLATOR PLACEMENT  09/30/08   MDT dual chamber ICD placed by Dr Rayann Heman  . CARDIOVASCULAR STRESS TEST  08/06/2008   EF 30%  . CHOLECYSTECTOMY    . COLON RESECTION     x2  . CORONARY ARTERY BYPASS GRAFT    . EP IMPLANTABLE DEVICE N/A 10/15/2015   Procedure:  PPM Generator Changeout;  Surgeon: [name], MD;  Location: [place] [facility];  Service: Cardiovascular;  Laterality: N/A;  . HERNIA REPAIR    . US ECHOCARDIOGRAPHY  08/11/2008   EF 30-35%  . VASECTOMY      History  Smoking Status  . Former Smoker  . Types: Cigars  Smokeless Tobacco  . Never Used    History  Alcohol Use No    Family History  Problem Relation Age of Onset  . Colon cancer Father   . Diabetes Sister   . Colon polyps Brother   . Pancreatic cancer Brother 74    Review of Systems: The review of systems is per the HPI. He does note some intermittent incontinence of stool.    All other systems were reviewed and are negative.  Physical Exam: BP 126/70   Pulse 74   Ht 5\' 11"  (1.803 m)   Wt 155 lb 6.4 oz (70.5 kg)   BMI 21.67 kg/m  Patient is very pleasant elderly male  in no acute distress. Skin is warm and dry. Color is normal.  HEENT is unremarkable. Normocephalic/atraumatic. PERRL. Sclera are nonicteric. Neck is supple. No masses. JVD is normal.  No bruits.  Lungs are clear.  Cardiac exam shows an irregular rate and rhythm. Normal S1 and S2. 1-0/1 apical systolic murmur. No gallop. Abdomen is soft. Extremities demonstrate no edema. Gait and ROM are intact. No gross neurologic deficits noted.   LABORATORY DATA:   Lab Results  Component Value Date   WBC 7.4 12/22/2015   HGB 13.8 12/22/2015   HCT 43.8 12/22/2015   PLT 204 12/22/2015   GLUCOSE 74 02/25/2016   ALT 35 12/22/2015   AST 53 (H) 12/22/2015   NA 139 02/25/2016   K 4.5 02/25/2016   CL 104 02/25/2016   CREATININE 1.12 (  H) 02/25/2016   BUN 18 02/25/2016   CO2 25 02/25/2016   TSH 1.87 07/26/2012   INR 1.18 10/02/2011   HGBA1C  06/11/2007    5.7 (NOTE)   The ADA recommends the following therapeutic goals for glycemic   control related to Hgb A1C measurement:   Goal of Therapy:   < 7.0% Hgb A1C   Action Suggested:  > 8.0% Hgb A1C   Ref:  Diabetes Care, 22, Suppl. 1, 1999   Dig level January 2017- 0.6  Labs dated 03/22/16: cholesterol 152, triglycerides 43, HDL 48, LDL 95. BUN 23, creatinine 1.1.   Echo: 01/04/16:Study Conclusions  - Left ventricle: Inferior wall hypokinesis The cavity size was moderately dilated. Wall thickness was increased in a pattern of mild LVH. Systolic function was mildly to moderately reduced. The estimated ejection fraction was in the range of 40% to 45%. - Aortic valve: There was mild regurgitation. - Mitral valve: There was moderate regurgitation. - Left atrium: The atrium was moderately dilated. - Right ventricle: The cavity size was mildly dilated. - Right atrium: The atrium was mildly dilated. - Atrial septum: No defect or patent foramen ovale was identified. - Pulmonary arteries: PA peak pressure: 71 mm Hg (S).   Assessment / Plan: 1. Chronic systolic CHF. EF 40-45% by last Echo. Moderate MR. Pulmonary HTN. Volume status improved on aldactone and lasix.  On ACEi, Dig, metoprolol. Will use extra lasix prn for increased swelling or weight gain. He currently looks well compensated. Will have Dig level drawn with lab work in 2 weeks.   2. Atrial fibrillation. He has chronic Afib.  Not a candidate for anticoagulation due to major GI bleed history. Rate is well controlled at rest.  Will continue  metoprolol to 125 mg daily and digoxin. Device check indicates rate control is satisfactory. He is not a candidate for anticoagulation   3. Ischemic cardiomyopathy as per # 1.   4. Coronary disease with old inferiolateral myocardial infarction. Status post CABG in 2002. Currently asymptomatic. Continue metoprolol. He is not a candidate for antiplatelet or anticoagulant therapy because of recurrent GI bleeding.  5. Hypertension, controlled.  6. Hypercholesterolemia. Follow up labs in 2 weeks with Dr. Joylene Draft.

## 2017-03-15 NOTE — Telephone Encounter (Signed)
Routing to provider  

## 2017-03-15 NOTE — Telephone Encounter (Signed)
Wrong pt, I believe the correct pt ID number is [id number]

## 2017-03-15 NOTE — Telephone Encounter (Deleted)
Patient called because the antibiotic she was put on for her infection is making her sick on her stomach.  She says she has tried it with food and without food.   [place] phar if you call anything else in.  [place] [phone]  Thanks

## 2017-03-15 NOTE — Telephone Encounter (Signed)
Encounter on patient opened in error

## 2017-03-16 ENCOUNTER — Encounter: Payer: Self-pay | Admitting: Cardiology

## 2017-03-16 ENCOUNTER — Ambulatory Visit (INDEPENDENT_AMBULATORY_CARE_PROVIDER_SITE_OTHER): Payer: Medicare HMO | Admitting: Cardiology

## 2017-03-16 VITALS — BP 126/70 | HR 74 | Ht 71.0 in | Wt 155.4 lb

## 2017-03-16 DIAGNOSIS — I255 Ischemic cardiomyopathy: Secondary | ICD-10-CM

## 2017-03-16 DIAGNOSIS — I2510 Atherosclerotic heart disease of native coronary artery without angina pectoris: Secondary | ICD-10-CM | POA: Diagnosis not present

## 2017-03-16 DIAGNOSIS — I5022 Chronic systolic (congestive) heart failure: Secondary | ICD-10-CM

## 2017-03-16 DIAGNOSIS — Z95810 Presence of automatic (implantable) cardiac defibrillator: Secondary | ICD-10-CM | POA: Diagnosis not present

## 2017-03-16 DIAGNOSIS — I482 Chronic atrial fibrillation, unspecified: Secondary | ICD-10-CM

## 2017-03-16 NOTE — Patient Instructions (Signed)
We will have Dr. Joylene Draft check a Dig level on your blood work  I will see you in 6 months.

## 2017-03-23 DIAGNOSIS — C44729 Squamous cell carcinoma of skin of left lower limb, including hip: Secondary | ICD-10-CM | POA: Diagnosis not present

## 2017-03-23 DIAGNOSIS — C4442 Squamous cell carcinoma of skin of scalp and neck: Secondary | ICD-10-CM | POA: Diagnosis not present

## 2017-03-23 DIAGNOSIS — L821 Other seborrheic keratosis: Secondary | ICD-10-CM | POA: Diagnosis not present

## 2017-03-23 DIAGNOSIS — C44329 Squamous cell carcinoma of skin of other parts of face: Secondary | ICD-10-CM | POA: Diagnosis not present

## 2017-03-23 DIAGNOSIS — Z85828 Personal history of other malignant neoplasm of skin: Secondary | ICD-10-CM | POA: Diagnosis not present

## 2017-04-11 DIAGNOSIS — Z125 Encounter for screening for malignant neoplasm of prostate: Secondary | ICD-10-CM | POA: Diagnosis not present

## 2017-04-11 DIAGNOSIS — Z95810 Presence of automatic (implantable) cardiac defibrillator: Secondary | ICD-10-CM | POA: Diagnosis not present

## 2017-04-11 DIAGNOSIS — M859 Disorder of bone density and structure, unspecified: Secondary | ICD-10-CM | POA: Diagnosis not present

## 2017-04-11 DIAGNOSIS — R7301 Impaired fasting glucose: Secondary | ICD-10-CM | POA: Diagnosis not present

## 2017-04-11 DIAGNOSIS — I482 Chronic atrial fibrillation: Secondary | ICD-10-CM | POA: Diagnosis not present

## 2017-04-11 DIAGNOSIS — E784 Other hyperlipidemia: Secondary | ICD-10-CM | POA: Diagnosis not present

## 2017-04-11 DIAGNOSIS — I10 Essential (primary) hypertension: Secondary | ICD-10-CM | POA: Diagnosis not present

## 2017-04-11 DIAGNOSIS — R946 Abnormal results of thyroid function studies: Secondary | ICD-10-CM | POA: Diagnosis not present

## 2017-04-11 DIAGNOSIS — I5022 Chronic systolic (congestive) heart failure: Secondary | ICD-10-CM | POA: Diagnosis not present

## 2017-04-11 DIAGNOSIS — I2510 Atherosclerotic heart disease of native coronary artery without angina pectoris: Secondary | ICD-10-CM | POA: Diagnosis not present

## 2017-04-11 DIAGNOSIS — I255 Ischemic cardiomyopathy: Secondary | ICD-10-CM | POA: Diagnosis not present

## 2017-04-17 ENCOUNTER — Other Ambulatory Visit: Payer: Self-pay | Admitting: Cardiology

## 2017-04-18 DIAGNOSIS — R808 Other proteinuria: Secondary | ICD-10-CM | POA: Diagnosis not present

## 2017-04-18 DIAGNOSIS — J449 Chronic obstructive pulmonary disease, unspecified: Secondary | ICD-10-CM | POA: Diagnosis not present

## 2017-04-18 DIAGNOSIS — I255 Ischemic cardiomyopathy: Secondary | ICD-10-CM | POA: Diagnosis not present

## 2017-04-18 DIAGNOSIS — C189 Malignant neoplasm of colon, unspecified: Secondary | ICD-10-CM | POA: Diagnosis not present

## 2017-04-18 DIAGNOSIS — I480 Paroxysmal atrial fibrillation: Secondary | ICD-10-CM | POA: Diagnosis not present

## 2017-04-18 DIAGNOSIS — Z0000 Encounter for general adult medical examination without abnormal findings: Secondary | ICD-10-CM | POA: Diagnosis not present

## 2017-04-18 DIAGNOSIS — I509 Heart failure, unspecified: Secondary | ICD-10-CM | POA: Diagnosis not present

## 2017-04-18 DIAGNOSIS — Z950 Presence of cardiac pacemaker: Secondary | ICD-10-CM | POA: Diagnosis not present

## 2017-04-18 DIAGNOSIS — I2510 Atherosclerotic heart disease of native coronary artery without angina pectoris: Secondary | ICD-10-CM | POA: Diagnosis not present

## 2017-04-25 DIAGNOSIS — Z0100 Encounter for examination of eyes and vision without abnormal findings: Secondary | ICD-10-CM | POA: Diagnosis not present

## 2017-04-25 DIAGNOSIS — E7800 Pure hypercholesterolemia, unspecified: Secondary | ICD-10-CM | POA: Diagnosis not present

## 2017-04-25 DIAGNOSIS — H35033 Hypertensive retinopathy, bilateral: Secondary | ICD-10-CM | POA: Diagnosis not present

## 2017-04-25 DIAGNOSIS — H524 Presbyopia: Secondary | ICD-10-CM | POA: Diagnosis not present

## 2017-05-02 ENCOUNTER — Telehealth: Payer: Self-pay | Admitting: Cardiology

## 2017-05-02 MED ORDER — SPIRONOLACTONE 25 MG PO TABS: 25.0000 mg | ORAL_TABLET | Freq: Every day | ORAL | 3 refills | Status: DC

## 2017-05-02 NOTE — Telephone Encounter (Signed)
Refill sent to the pharmacy electronically.  

## 2017-05-02 NOTE — Telephone Encounter (Signed)
New message      *STAT* If patient is at the pharmacy, call can be transferred to refill team.   1. Which medications need to be refilled? (please list name of each medication and dose if known)  spironolactone (ALDACTONE) 25 MG tablet Take 1 tablet (25 mg total) by mouth daily.      2. Which pharmacy/location (including street and city if local pharmacy) is medication to be sent to? [facility] - [address]   3. Do they need a 30 day or 90 day supply?  [name] [name] lost his prescription , he is out needs a new prescription called into [facility]

## 2017-05-15 ENCOUNTER — Other Ambulatory Visit: Payer: Self-pay | Admitting: Cardiology

## 2017-05-15 NOTE — Telephone Encounter (Signed)
New Message  Per pt states he needs to get a new prescription for digoxin. Per pt states he was told he had refills but would need to get a new prescription for the medication. Please call back to discuss

## 2017-05-16 ENCOUNTER — Telehealth: Payer: Self-pay | Admitting: Cardiology

## 2017-05-16 NOTE — Telephone Encounter (Signed)
New message    Pt is calling about his medication. He said he is out and has been out for a few days.   *STAT* If patient is at the pharmacy, call can be transferred to refill team.   1. Which medications need to be refilled? (please list name of each medication and dose if known) digoxin 0.125 mg  2. Which pharmacy/location (including street and city if local pharmacy) is medication to be sent to? [facility] on [address].  3. Do they need a 30 day or 90 day supply? 30 day

## 2017-05-17 MED ORDER — DIGOXIN 125 MCG PO TABS: 62.5000 ug | ORAL_TABLET | Freq: Every day | ORAL | 11 refills | Status: DC

## 2017-05-17 NOTE — Telephone Encounter (Signed)
Refill sent to the pharmacy electronically.  

## 2017-05-22 ENCOUNTER — Ambulatory Visit (INDEPENDENT_AMBULATORY_CARE_PROVIDER_SITE_OTHER): Payer: Medicare HMO | Admitting: *Deleted

## 2017-05-22 DIAGNOSIS — I255 Ischemic cardiomyopathy: Secondary | ICD-10-CM | POA: Diagnosis not present

## 2017-05-22 NOTE — Progress Notes (Signed)
Remote pacemaker transmission.   

## 2017-05-24 ENCOUNTER — Encounter: Payer: Self-pay | Admitting: Cardiology

## 2017-05-25 LAB — CUP PACEART REMOTE DEVICE CHECK
Battery Remaining Longevity: 137 mo
Battery Remaining Percentage: 95.5 %
Battery Voltage: 3.04 V
Brady Statistic RV Percent Paced: 5.7 %
Implantable Lead Location: 753859
Implantable Lead Model: 5076
Implantable Lead Model: 6947
Lead Channel Impedance Value: 460 Ohm
Lead Channel Sensing Intrinsic Amplitude: 12 mV
Lead Channel Setting Pacing Amplitude: 2.5 V
Lead Channel Setting Pacing Pulse Width: 0.4 ms
Lead Channel Setting Sensing Sensitivity: 2 mV
MDC IDC LEAD IMPLANT DT: 20091103
MDC IDC LEAD IMPLANT DT: 20091103
MDC IDC LEAD LOCATION: 753860
MDC IDC MSMT LEADCHNL RV PACING THRESHOLD AMPLITUDE: 0.75 V
MDC IDC MSMT LEADCHNL RV PACING THRESHOLD PULSEWIDTH: 0.4 ms
MDC IDC PG IMPLANT DT: 20161117
MDC IDC SESS DTM: 20180625060016
Pulse Gen Model: 1240
Pulse Gen Serial Number: 7804513

## 2017-06-05 ENCOUNTER — Telehealth: Payer: Self-pay | Admitting: Cardiology

## 2017-06-05 NOTE — Telephone Encounter (Signed)
Close encounter 

## 2017-06-05 NOTE — Telephone Encounter (Signed)
Device Tech RN spoke w/ pt daughter.

## 2017-08-18 NOTE — Progress Notes (Signed)
[name] [name] Date of Birth: 1931-12-26 Medical Record #[id number]  History of Present Illness: [name] [name] is seen back today for follow up of CAD and CHF. He has a history of coronary disease with remote inferior lateral myocardial infarction. He is status post CABG in 2002. He has an ischemic cardiomyopathy with ejection fraction of 35-40% by Echo in November 2012. He is s/p prior ICD implant with last change out in [facility] 2016. He also has a history of atrial fibrillation. In November 2016 he underwent change out of the ICD to a PPM only. He had only had ICD therapies for Afib with RVR before.   On follow up today he is doing very well. Still works 2-3 days/week.   No chest pain. Prior attempts to increase beta blocker resulted in marked fatigue.  No dizziness or syncope. No palpitations. Some SOB if he walks far. No increase in edema. He did have "bout" with his stomach earlier this year and lost 5 lbs. No bleeding noted.   He was seen by Dr. Rayann Heman in March 2018. ICD device was functioning normally. Rate control was felt to be reasonably good.   Current Outpatient Prescriptions on File Prior to Visit  Medication Sig Dispense Refill  . cholecalciferol (VITAMIN D) 1000 UNITS tablet Take 1,000 Units by mouth daily.     . digoxin (LANOXIN) 0.125 MG tablet Take 0.5 tablets (62.5 mcg total) by mouth daily. 15 tablet 11  . diphenoxylate-atropine (LOMOTIL) 2.5-0.025 MG per tablet Take 2 tablets by mouth 2 (two) times daily as needed. For diarrhea     . fluorouracil (EFUDEX) 5 % cream Apply 1 application topically 2 (two) times daily.    . furosemide (LASIX) 40 MG tablet Take 1 tablet (40 mg total) by mouth daily. Take 40 mg alternating with 20 mg daily 90 tablet 3  . metoprolol (LOPRESSOR) 50 MG tablet Take 0.5 tablets (25 mg total) by mouth daily. 180 tablet 3  . metoprolol succinate (TOPROL-XL) 100 MG 24 hr tablet Take 1 tablet (100 mg total) by mouth daily. 90 tablet 3  . Multiple  Vitamins-Minerals (MULTIVITAMINS THER. W/MINERALS) TABS Take 1 tablet by mouth daily.      . nitroGLYCERIN (NITROSTAT) 0.4 MG SL tablet Place 1 tablet (0.4 mg total) under the tongue every 5 (five) minutes as needed for chest pain. 25 tablet 6  . PROAIR HFA 108 (90 BASE) MCG/ACT inhaler Inhale 2 puffs into the lungs every 4 (four) hours as needed for shortness of breath.     . psyllium (REGULOID) 0.52 G capsule Take 2 capsules by mouth daily.     . ramipril (ALTACE) 10 MG capsule TAKE 1 CAPSULE EVERY DAY 90 capsule 3  . rosuvastatin (CRESTOR) 5 MG tablet Take 5 mg by mouth 2 (two) times a week.    . spironolactone (ALDACTONE) 25 MG tablet Take 1 tablet (25 mg total) by mouth daily. 90 tablet 3  . temazepam (RESTORIL) 7.5 MG capsule Take 1 capsule (7.5 mg total) by mouth at bedtime as needed for sleep. 30 capsule 0  . vitamin B-12 (CYANOCOBALAMIN) 1000 MCG tablet Take 1,000 mcg by mouth daily.     No current facility-administered medications on file prior to visit.     Allergies  Allergen Reactions  . Aspirin     Bleeding   . Iron     Stomach issues  . Lescol [Fluvastatin Sodium]     Dizziness  . Pentazocine Lactate     unknown  .  Vytorin [Ezetimibe-Simvastatin]     Leg cramps     Past Medical History:  Diagnosis Date  . CAD (coronary artery disease)    Old inferolateral MI s/p CABG in 2002  . CAP (community acquired pneumonia) 10/03/2011  . Cardiomyopathy    s/p ICD 09/30/08; EF 35 to 40% per echo in October 2021  . Chronic systolic CHF (congestive heart failure) ([place]) 07/24/2013  . Colon cancer ([place])    s/p chemo, RT, sugery  . Colorectal anastomotic stricture   . Diverticulosis   . ED (erectile dysfunction)   . GERD (gastroesophageal reflux disease)    rarely  . HTN (hypertension)   . ICD (implantable cardiac defibrillator) discharge October 2012   ICD shock for afib with RVR  . Iron deficiency anemia   . Paroxysmal atrial fibrillation (HCC)    controlled with  amiodarone, not a coumadin candidate due to GI bleeding    Past Surgical History:  Procedure Laterality Date  . APPENDECTOMY    . BACK SURGERY    . CARDIAC CATHETERIZATION  11/07/2000   EF 32%  . CARDIAC DEFIBRILLATOR PLACEMENT  09/30/08   MDT dual chamber ICD placed by Dr Rayann Heman  . CARDIOVASCULAR STRESS TEST  08/06/2008   EF 30%  . CHOLECYSTECTOMY    . COLON RESECTION     x2  . CORONARY ARTERY BYPASS GRAFT    . EP IMPLANTABLE DEVICE N/A 10/15/2015   Procedure:  PPM Generator Changeout;  Surgeon: [name], MD;  Location: [place] [facility];  Service: Cardiovascular;  Laterality: N/A;  . HERNIA REPAIR    . US ECHOCARDIOGRAPHY  08/11/2008   EF 30-35%  . VASECTOMY      History  Smoking Status  . Former Smoker  . Types: Cigars  Smokeless Tobacco  . Never Used    History  Alcohol Use No    Family History  Problem Relation Age of Onset  . Colon cancer Father   . Diabetes Sister   . Colon polyps Brother   . Pancreatic cancer Brother 40    Review of Systems: The review of systems is per the HPI. He does note some intermittent incontinence of stool.    All other systems were reviewed and are negative.  Physical Exam: BP 101/62 (BP Location: Right Arm)   Pulse 72   Ht 5\' 11"  (1.803 m)   Wt 148 lb 12.8 oz (67.5 kg)   BMI 20.75 kg/m  GENERAL:  Elderly WM in NAD. Walks with a cane. HEENT:  PERRL, EOMI, sclera are clear. Oropharynx is clear. NECK:  No jugular venous distention, carotid upstroke brisk and symmetric, no bruits, no thyromegaly or adenopathy LUNGS:  Clear to auscultation bilaterally CHEST:  Unremarkable HEART:  IRRR,  PMI not displaced or sustained,S1 and S2 within normal limits, no S3, no S4: no clicks, no rubs, gr 2/6 systolic murmur at apex. ABD:  Soft, nontender. BS +, no masses or bruits. No hepatomegaly, no splenomegaly EXT:  2 + pulses throughout, no edema, no cyanosis no clubbing SKIN:  Warm and dry.  No rashes NEURO:  Alert and oriented x 3.  Cranial nerves II through XII intact. PSYCH:  Cognitively intact      LABORATORY DATA:  Lab Results  Component Value Date   WBC 7.4 12/22/2015   HGB 13.8 12/22/2015   HCT 43.8 12/22/2015   PLT 204 12/22/2015   GLUCOSE 74 02/25/2016   ALT 35 12/22/2015   AST 53 (H) 12/22/2015   NA  139 02/25/2016   K 4.5 02/25/2016   CL 104 02/25/2016   CREATININE 1.12 (H) 02/25/2016   BUN 18 02/25/2016   CO2 25 02/25/2016   TSH 1.87 07/26/2012   INR 1.18 10/02/2011   HGBA1C  06/11/2007    5.7 (NOTE)   The ADA recommends the following therapeutic goals for glycemic   control related to Hgb A1C measurement:   Goal of Therapy:   < 7.0% Hgb A1C   Action Suggested:  > 8.0% Hgb A1C   Ref:  Diabetes Care, 22, Suppl. 1, 1999   Dig level January 2017- 0.6  Labs dated 03/22/16: cholesterol 152, triglycerides 43, HDL 48, LDL 95. BUN 23, creatinine 1.1.  Dated 04/11/17: cholesterol 156, triglycerides 49, HDL 58, LDL 88. Glucose 123 otherwise CMET normal. TFTs normal. Hgb 14.2. Dig level 0.5  Echo: 01/04/16:Study Conclusions  - Left ventricle: Inferior wall hypokinesis The cavity size was moderately dilated. Wall thickness was increased in a pattern of mild LVH. Systolic function was mildly to moderately reduced. The estimated ejection fraction was in the range of 40% to 45%. - Aortic valve: There was mild regurgitation. - Mitral valve: There was moderate regurgitation. - Left atrium: The atrium was moderately dilated. - Right ventricle: The cavity size was mildly dilated. - Right atrium: The atrium was mildly dilated. - Atrial septum: No defect or patent foramen ovale was identified. - Pulmonary arteries: PA peak pressure: 71 mm Hg (S).   Assessment / Plan: 1. Chronic systolic CHF. EF 40-45% by last Echo. Moderate MR. Pulmonary HTN. Volume status is stable  on aldactone and lasix.  On ACEi, Dig, metoprolol. Will use extra lasix prn for increased swelling or weight gain. He  looks well  compensated. Dig level is ok.   2. Atrial fibrillation. He has chronic Afib.  Not a candidate for anticoagulation due to major GI bleed history. Rate is well controlled.  Will continue metoprolol to 12.5 mg daily and digoxin. Device check indicates rate control is satisfactory.   3. Ischemic cardiomyopathy as per # 1.   4. Coronary disease with old inferiolateral myocardial infarction. Status post CABG in 2002. Currently asymptomatic. Continue metoprolol. He is not a candidate for antiplatelet or anticoagulant therapy because of recurrent GI bleeding.  5. Hypertension, controlled.  6. Hypercholesterolemia. Improved on last lab work in May. On low dose Crestor.

## 2017-08-21 ENCOUNTER — Ambulatory Visit (INDEPENDENT_AMBULATORY_CARE_PROVIDER_SITE_OTHER): Payer: Medicare HMO | Admitting: *Deleted

## 2017-08-21 DIAGNOSIS — I255 Ischemic cardiomyopathy: Secondary | ICD-10-CM

## 2017-08-21 NOTE — Progress Notes (Signed)
Remote pacemaker transmission.   

## 2017-08-24 ENCOUNTER — Encounter: Payer: Self-pay | Admitting: Cardiology

## 2017-08-25 ENCOUNTER — Encounter: Payer: Self-pay | Admitting: Cardiology

## 2017-08-25 ENCOUNTER — Ambulatory Visit (INDEPENDENT_AMBULATORY_CARE_PROVIDER_SITE_OTHER): Payer: Medicare HMO | Admitting: Cardiology

## 2017-08-25 VITALS — BP 101/62 | HR 72 | Ht 71.0 in | Wt 148.8 lb

## 2017-08-25 DIAGNOSIS — I10 Essential (primary) hypertension: Secondary | ICD-10-CM | POA: Diagnosis not present

## 2017-08-25 DIAGNOSIS — I482 Chronic atrial fibrillation, unspecified: Secondary | ICD-10-CM

## 2017-08-25 DIAGNOSIS — Z95810 Presence of automatic (implantable) cardiac defibrillator: Secondary | ICD-10-CM

## 2017-08-25 DIAGNOSIS — I2510 Atherosclerotic heart disease of native coronary artery without angina pectoris: Secondary | ICD-10-CM

## 2017-08-25 DIAGNOSIS — I5022 Chronic systolic (congestive) heart failure: Secondary | ICD-10-CM | POA: Diagnosis not present

## 2017-08-25 LAB — CUP PACEART REMOTE DEVICE CHECK
Battery Remaining Longevity: 138 mo
Battery Remaining Percentage: 95.5 %
Battery Voltage: 3.04 V
Implantable Lead Implant Date: 20091103
Implantable Lead Implant Date: 20091103
Implantable Lead Location: 753860
Implantable Lead Model: 6947
Implantable Pulse Generator Implant Date: 20161117
Lead Channel Pacing Threshold Pulse Width: 0.4 ms
Lead Channel Setting Pacing Pulse Width: 0.4 ms
Lead Channel Setting Sensing Sensitivity: 2 mV
MDC IDC LEAD LOCATION: 753859
MDC IDC MSMT LEADCHNL RV IMPEDANCE VALUE: 490 Ohm
MDC IDC MSMT LEADCHNL RV PACING THRESHOLD AMPLITUDE: 0.75 V
MDC IDC MSMT LEADCHNL RV SENSING INTR AMPL: 12 mV
MDC IDC SESS DTM: 20180924060013
MDC IDC SET LEADCHNL RV PACING AMPLITUDE: 2.5 V
MDC IDC STAT BRADY RV PERCENT PACED: 6 %
Pulse Gen Serial Number: 7804513

## 2017-08-25 NOTE — Patient Instructions (Signed)
Continue your current therapy  I will see you in 6 months.   

## 2017-08-28 ENCOUNTER — Other Ambulatory Visit: Payer: Self-pay | Admitting: Internal Medicine

## 2017-10-09 DIAGNOSIS — Z950 Presence of cardiac pacemaker: Secondary | ICD-10-CM | POA: Diagnosis not present

## 2017-10-09 DIAGNOSIS — I2510 Atherosclerotic heart disease of native coronary artery without angina pectoris: Secondary | ICD-10-CM | POA: Diagnosis not present

## 2017-10-09 DIAGNOSIS — I509 Heart failure, unspecified: Secondary | ICD-10-CM | POA: Diagnosis not present

## 2017-10-09 DIAGNOSIS — D72829 Elevated white blood cell count, unspecified: Secondary | ICD-10-CM | POA: Diagnosis not present

## 2017-10-09 DIAGNOSIS — R197 Diarrhea, unspecified: Secondary | ICD-10-CM | POA: Diagnosis not present

## 2017-10-09 DIAGNOSIS — R7301 Impaired fasting glucose: Secondary | ICD-10-CM | POA: Diagnosis not present

## 2017-10-09 DIAGNOSIS — I10 Essential (primary) hypertension: Secondary | ICD-10-CM | POA: Diagnosis not present

## 2017-10-09 DIAGNOSIS — Z6823 Body mass index (BMI) 23.0-23.9, adult: Secondary | ICD-10-CM | POA: Diagnosis not present

## 2017-10-09 DIAGNOSIS — I480 Paroxysmal atrial fibrillation: Secondary | ICD-10-CM | POA: Diagnosis not present

## 2017-10-26 ENCOUNTER — Ambulatory Visit: Payer: Medicare HMO | Admitting: Student

## 2017-11-22 ENCOUNTER — Ambulatory Visit (INDEPENDENT_AMBULATORY_CARE_PROVIDER_SITE_OTHER): Payer: Medicare HMO | Admitting: *Deleted

## 2017-11-22 DIAGNOSIS — I255 Ischemic cardiomyopathy: Secondary | ICD-10-CM

## 2017-11-22 NOTE — Progress Notes (Signed)
Remote pacemaker transmission.   

## 2017-11-23 ENCOUNTER — Encounter: Payer: Self-pay | Admitting: Cardiology

## 2017-12-04 LAB — CUP PACEART REMOTE DEVICE CHECK
Battery Remaining Longevity: 137 mo
Battery Remaining Percentage: 95.5 %
Battery Voltage: 3.02 V
Date Time Interrogation Session: 20181226070013
Implantable Lead Implant Date: 20091103
Implantable Lead Location: 753859
Implantable Lead Location: 753860
Implantable Lead Model: 5076
Lead Channel Setting Pacing Pulse Width: 0.4 ms
MDC IDC LEAD IMPLANT DT: 20091103
MDC IDC MSMT LEADCHNL RV IMPEDANCE VALUE: 450 Ohm
MDC IDC MSMT LEADCHNL RV PACING THRESHOLD AMPLITUDE: 0.75 V
MDC IDC MSMT LEADCHNL RV PACING THRESHOLD PULSEWIDTH: 0.4 ms
MDC IDC MSMT LEADCHNL RV SENSING INTR AMPL: 12 mV
MDC IDC PG IMPLANT DT: 20161117
MDC IDC PG SERIAL: 7804513
MDC IDC SET LEADCHNL RV PACING AMPLITUDE: 2.5 V
MDC IDC SET LEADCHNL RV SENSING SENSITIVITY: 2 mV
MDC IDC STAT BRADY RV PERCENT PACED: 6 %
Pulse Gen Model: 1240

## 2018-01-03 ENCOUNTER — Telehealth: Payer: Self-pay | Admitting: Internal Medicine

## 2018-01-03 NOTE — Telephone Encounter (Signed)
Spoke w/ pt and informed him a remote transmission was received. A Device Tech RN reviewed the transmission and I informed him that the transmission was normal. Pt verbalized understanding.

## 2018-01-03 NOTE — Telephone Encounter (Signed)
New message ° ° ° °1. Has your device fired? NO ° °2. Is you device beeping? YES ° °3. Are you experiencing draining or swelling at device site? NO ° °4. Are you calling to see if we received your device transmission? NO ° °5. Have you passed out? NO ° ° ° °Please route to Device Clinic Pool °

## 2018-02-09 ENCOUNTER — Other Ambulatory Visit: Payer: Self-pay | Admitting: Cardiology

## 2018-02-09 ENCOUNTER — Encounter: Payer: Self-pay | Admitting: Cardiology

## 2018-02-09 NOTE — Telephone Encounter (Signed)
REFILL 

## 2018-02-17 NOTE — Progress Notes (Signed)
[name] [name] Date of Birth: 1932/10/30 Medical Record #[id number]  History of Present Illness: [name] [name] is seen back today for follow up of CAD and CHF. He has a history of coronary disease with remote inferior lateral myocardial infarction. He is status post CABG in 2002. He has an ischemic cardiomyopathy with ejection fraction of 35-40% by Echo in November 2012. He is s/p prior ICD implant with last change out in [facility] 2016. He also has a history of atrial fibrillation. In November 2016 he underwent change out of the ICD to a PPM only. He had only had ICD therapies for Afib with RVR before.   On follow up today he is doing very well. Still works 2-3 days/week.   He denies chest pain. Prior attempts to increase beta blocker resulted in marked fatigue.  No dizziness or syncope. No palpitations. He gets some SOB if he has to walk far but thinks his breathing is doing better.  No increase in edema. Takes lasix only once a week.   ICD device check 11/24/17 showed device was functioning normally. Rate control was felt to be  good.   Current Outpatient Medications on File Prior to Visit  Medication Sig Dispense Refill  . cholecalciferol (VITAMIN D) 1000 UNITS tablet Take 1,000 Units by mouth daily.     . digoxin (LANOXIN) 0.125 MG tablet Take 0.5 tablets (62.5 mcg total) by mouth daily. 15 tablet 11  . diphenoxylate-atropine (LOMOTIL) 2.5-0.025 MG per tablet Take 2 tablets by mouth 2 (two) times daily as needed. For diarrhea     . fluorouracil (EFUDEX) 5 % cream Apply 1 application topically 2 (two) times daily.    . furosemide (LASIX) 40 MG tablet Take 1 tablet (40 mg total) by mouth daily. Take 40 mg alternating with 20 mg daily 90 tablet 3  . metoprolol (LOPRESSOR) 50 MG tablet Take 0.5 tablets (25 mg total) by mouth daily. 180 tablet 3  . metoprolol succinate (TOPROL-XL) 100 MG 24 hr tablet TAKE 1 TABLET EVERY DAY 90 tablet 3  . Multiple Vitamins-Minerals (MULTIVITAMINS THER. W/MINERALS) TABS  Take 1 tablet by mouth daily.      . nitroGLYCERIN (NITROSTAT) 0.4 MG SL tablet Place 1 tablet (0.4 mg total) under the tongue every 5 (five) minutes as needed for chest pain. 25 tablet 6  . PROAIR HFA 108 (90 BASE) MCG/ACT inhaler Inhale 2 puffs into the lungs every 4 (four) hours as needed for shortness of breath.     . psyllium (REGULOID) 0.52 G capsule Take 2 capsules by mouth daily.     . rosuvastatin (CRESTOR) 5 MG tablet Take 5 mg by mouth 2 (two) times a week.    . spironolactone (ALDACTONE) 25 MG tablet Take 1 tablet (25 mg total) by mouth daily. 90 tablet 3  . temazepam (RESTORIL) 7.5 MG capsule Take 1 capsule (7.5 mg total) by mouth at bedtime as needed for sleep. 30 capsule 0  . vitamin B-12 (CYANOCOBALAMIN) 1000 MCG tablet Take 1,000 mcg by mouth daily.     No current facility-administered medications on file prior to visit.     Allergies  Allergen Reactions  . Aspirin     Bleeding   . Iron     Stomach issues  . Lescol [Fluvastatin Sodium]     Dizziness  . Pentazocine Lactate     unknown  . Vytorin [Ezetimibe-Simvastatin]     Leg cramps     Past Medical History:  Diagnosis Date  . CAD (coronary  artery disease)    Old inferolateral MI s/p CABG in 2002  . CAP (community acquired pneumonia) 10/03/2011  . Cardiomyopathy    s/p ICD 09/30/08; EF 35 to 40% per echo in October 2021  . Chronic systolic CHF (congestive heart failure) ([place]) 07/24/2013  . Colon cancer ([place])    s/p chemo, RT, sugery  . Colorectal anastomotic stricture   . Diverticulosis   . ED (erectile dysfunction)   . GERD (gastroesophageal reflux disease)    rarely  . HTN (hypertension)   . ICD (implantable cardiac defibrillator) discharge October 2012   ICD shock for afib with RVR  . Iron deficiency anemia   . Paroxysmal atrial fibrillation (HCC)    controlled with amiodarone, not a coumadin candidate due to GI bleeding    Past Surgical History:  Procedure Laterality Date  . APPENDECTOMY    .  BACK SURGERY    . CARDIAC CATHETERIZATION  11/07/2000   EF 32%  . CARDIAC DEFIBRILLATOR PLACEMENT  09/30/08   MDT dual chamber ICD placed by Dr Rayann Heman  . CARDIOVASCULAR STRESS TEST  08/06/2008   EF 30%  . CHOLECYSTECTOMY    . COLON RESECTION     x2  . CORONARY ARTERY BYPASS GRAFT    . EP IMPLANTABLE DEVICE N/A 10/15/2015   Procedure:  PPM Generator Changeout;  Surgeon: [name], MD;  Location: [place] [facility];  Service: Cardiovascular;  Laterality: N/A;  . HERNIA REPAIR    . US ECHOCARDIOGRAPHY  08/11/2008   EF 30-35%  . VASECTOMY      Social History   Tobacco Use  Smoking Status Former Smoker  . Types: Cigars  Smokeless Tobacco Never Used    Social History   Substance and Sexual Activity  Alcohol Use No    Family History  Problem Relation Age of Onset  . Colon cancer Father   . Diabetes Sister   . Colon polyps Brother   . Pancreatic cancer Brother 78    Review of Systems: The review of systems is per the HPI. He does note some intermittent incontinence of stool.    All other systems were reviewed and are negative.  Physical Exam: BP 106/66   Pulse 93   Ht 5\' 11"  (1.803 m)   Wt 149 lb (67.6 kg)   BMI 20.78 kg/m  GENERAL:  Elderly WM in NAD. Walks with a cane. Kyphotic.  HEENT:  PERRL, EOMI, sclera are clear. Oropharynx is clear. NECK:  No jugular venous distention, carotid upstroke brisk and symmetric, no bruits, no thyromegaly or adenopathy LUNGS:  Clear to auscultation bilaterally CHEST:  Unremarkable HEART:  IRRR,  PMI not displaced or sustained,S1 and S2 within normal limits, no S3, no S4: no clicks, no rubs, gr 2/6 systolic murmur at apex. ABD:  Soft, nontender. BS +, no masses or bruits. No hepatomegaly, no splenomegaly EXT:  2 + pulses throughout, no edema, no cyanosis no clubbing SKIN:  Warm and dry.  No rashes NEURO:  Alert and oriented x 3. Cranial nerves II through XII intact. PSYCH:  Cognitively intact      LABORATORY DATA:  Lab  Results  Component Value Date   WBC 7.4 12/22/2015   HGB 13.8 12/22/2015   HCT 43.8 12/22/2015   PLT 204 12/22/2015   GLUCOSE 74 02/25/2016   ALT 35 12/22/2015   AST 53 (H) 12/22/2015   NA 139 02/25/2016   K 4.5 02/25/2016   CL 104 02/25/2016   CREATININE 1.12 (H) 02/25/2016  BUN 18 02/25/2016   CO2 25 02/25/2016   TSH 1.87 07/26/2012   INR 1.18 10/02/2011   HGBA1C  06/11/2007    5.7 (NOTE)   The ADA recommends the following therapeutic goals for glycemic   control related to Hgb A1C measurement:   Goal of Therapy:   < 7.0% Hgb A1C   Action Suggested:  > 8.0% Hgb A1C   Ref:  Diabetes Care, 22, Suppl. 1, 1999   Dig level January 2017- 0.6  Labs dated 03/22/16: cholesterol 152, triglycerides 43, HDL 48, LDL 95. BUN 23, creatinine 1.1.  Dated 04/11/17: cholesterol 156, triglycerides 49, HDL 58, LDL 88. Glucose 123 otherwise CMET normal. TFTs normal. Hgb 14.2. Dig level 0.5  Ecg today shows Afib with rat e 93. Nonspecific IVCD. ST T changes c/w dig effect. No change compared to March 2018.   Echo: 01/04/16:Study Conclusions  - Left ventricle: Inferior wall hypokinesis The cavity size was moderately dilated. Wall thickness was increased in a pattern of mild LVH. Systolic function was mildly to moderately reduced. The estimated ejection fraction was in the range of 40% to 45%. - Aortic valve: There was mild regurgitation. - Mitral valve: There was moderate regurgitation. - Left atrium: The atrium was moderately dilated. - Right ventricle: The cavity size was mildly dilated. - Right atrium: The atrium was mildly dilated. - Atrial septum: No defect or patent foramen ovale was identified. - Pulmonary arteries: PA peak pressure: 71 mm Hg (S).   Assessment / Plan: 1. Chronic systolic CHF. EF 40-45% by last Echo. Moderate MR. Pulmonary HTN. No evidence of volume overload on lasix once a week.   On ACEi, Dig, metoprolol. Will use extra lasix prn for increased swelling or weight  gain.  2. Atrial fibrillation. Rate is well controlled.  Not a candidate for anticoagulation due to major GI bleed history. Will continue metoprolol to 12.5 mg daily and digoxin. Device check indicates rate control is satisfactory.   3. Ischemic cardiomyopathy as per # 1.   4. Coronary disease with old inferiolateral myocardial infarction. Status post CABG in 2002. He is asymptomatic. Continue metoprolol. He is not a candidate for antiplatelet or anticoagulant therapy because of recurrent GI bleeding.  5. Hypertension, controlled.  6. Hypercholesterolemia.  On low dose Crestor. Plan follow up lab work next month with primary care.

## 2018-02-19 ENCOUNTER — Ambulatory Visit: Payer: Medicare HMO | Admitting: Cardiology

## 2018-02-19 ENCOUNTER — Encounter: Payer: Self-pay | Admitting: Cardiology

## 2018-02-19 VITALS — BP 106/66 | HR 93 | Ht 71.0 in | Wt 149.0 lb

## 2018-02-19 DIAGNOSIS — I2510 Atherosclerotic heart disease of native coronary artery without angina pectoris: Secondary | ICD-10-CM | POA: Diagnosis not present

## 2018-02-19 DIAGNOSIS — I5022 Chronic systolic (congestive) heart failure: Secondary | ICD-10-CM | POA: Diagnosis not present

## 2018-02-19 DIAGNOSIS — I482 Chronic atrial fibrillation, unspecified: Secondary | ICD-10-CM

## 2018-02-19 DIAGNOSIS — Z95810 Presence of automatic (implantable) cardiac defibrillator: Secondary | ICD-10-CM

## 2018-02-19 DIAGNOSIS — I255 Ischemic cardiomyopathy: Secondary | ICD-10-CM | POA: Diagnosis not present

## 2018-02-19 MED ORDER — RAMIPRIL 10 MG PO CAPS: 10.0000 mg | ORAL_CAPSULE | Freq: Every day | ORAL | 3 refills | Status: DC

## 2018-02-19 NOTE — Patient Instructions (Signed)
Continue your current therapy  I will see you in 6 months.   

## 2018-02-20 NOTE — Progress Notes (Signed)
Electrophysiology Office Note Date: 02/27/2018  ID:  [name] [name] 1932-11-07, MRN [id number]  PCP: [name] Infante, MD Primary Cardiologist: [name] [name] Electrophysiologist: [name] [name]  CC: Routine PPM follow-up  [name] [name] is a 84 y.o. male seen today for Dr [name] [name].  He presents today for routine electrophysiology followup.  Since last being seen in our clinic, the patient reports doing relatively well.  He has stable shortness of breath with exertion.  He denies chest pain, palpitations, PND, orthopnea, nausea, vomiting, dizziness, syncope, edema, weight gain, or early satiety.  Device History: MDT dual chamber ICD implanted 2009 for primary prevention; downgrade to STJ dual chamber PPM implanted 2016    Past Medical History:  Diagnosis Date  . CAD (coronary artery disease)    Old inferolateral MI s/p CABG in 2002  . CAP (community acquired pneumonia) 10/03/2011  . Cardiomyopathy    s/p ICD 09/30/08; EF 35 to 40% per echo in October 2021  . Chronic systolic CHF (congestive heart failure) ([place]) 07/24/2013  . Colon cancer ([place])    s/p chemo, RT, sugery  . Colorectal anastomotic stricture   . Diverticulosis   . ED (erectile dysfunction)   . GERD (gastroesophageal reflux disease)    rarely  . HTN (hypertension)   . ICD (implantable cardiac defibrillator) discharge October 2012   ICD shock for afib with RVR  . Iron deficiency anemia   . Paroxysmal atrial fibrillation (HCC)    controlled with amiodarone, not a coumadin candidate due to GI bleeding   Past Surgical History:  Procedure Laterality Date  . APPENDECTOMY    . BACK SURGERY    . CARDIAC CATHETERIZATION  11/07/2000   EF 32%  . CARDIAC DEFIBRILLATOR PLACEMENT  09/30/08   MDT dual chamber ICD placed by Dr [name] [name]  . CARDIOVASCULAR STRESS TEST  08/06/2008   EF 30%  . CHOLECYSTECTOMY    . COLON RESECTION     x2  . CORONARY ARTERY BYPASS GRAFT    . EP IMPLANTABLE DEVICE N/A 10/15/2015   Procedure:  PPM Generator  Changeout;  Surgeon: [name] Grayer, MD;  Location: [place] [facility];  Service: Cardiovascular;  Laterality: N/A;  . HERNIA REPAIR    . US ECHOCARDIOGRAPHY  08/11/2008   EF 30-35%  . VASECTOMY      Current Outpatient Medications  Medication Sig Dispense Refill  . cholecalciferol (VITAMIN D) 1000 UNITS tablet Take 1,000 Units by mouth daily.     . digoxin (LANOXIN) 0.125 MG tablet Take 0.5 tablets (62.5 mcg total) by mouth daily. 15 tablet 11  . diphenoxylate-atropine (LOMOTIL) 2.5-0.025 MG per tablet Take 2 tablets by mouth 2 (two) times daily as needed. For diarrhea     . fluorouracil (EFUDEX) 5 % cream Apply 1 application topically 2 (two) times daily.    . furosemide (LASIX) 40 MG tablet Take 1 tablet (40 mg total) by mouth daily. Take 40 mg alternating with 20 mg daily 90 tablet 3  . metoprolol (LOPRESSOR) 50 MG tablet Take 0.5 tablets (25 mg total) by mouth daily. 180 tablet 3  . metoprolol succinate (TOPROL-XL) 100 MG 24 hr tablet TAKE 1 TABLET EVERY DAY 90 tablet 3  . Multiple Vitamins-Minerals (MULTIVITAMINS THER. W/MINERALS) TABS Take 1 tablet by mouth daily.      . nitroGLYCERIN (NITROSTAT) 0.4 MG SL tablet Place 1 tablet (0.4 mg total) under the tongue every 5 (five) minutes as needed for chest pain. 25 tablet 6  . PROAIR HFA 108 (90 BASE)  MCG/ACT inhaler Inhale 2 puffs into the lungs every 4 (four) hours as needed for shortness of breath.     . psyllium (REGULOID) 0.52 G capsule Take 2 capsules by mouth daily.     . ramipril (ALTACE) 10 MG capsule Take 1 capsule (10 mg total) by mouth daily. 90 capsule 3  . rosuvastatin (CRESTOR) 5 MG tablet Take 5 mg by mouth 2 (two) times a week.    . spironolactone (ALDACTONE) 25 MG tablet Take 1 tablet (25 mg total) by mouth daily. 90 tablet 3  . temazepam (RESTORIL) 7.5 MG capsule Take 1 capsule (7.5 mg total) by mouth at bedtime as needed for sleep. 30 capsule 0  . vitamin B-12 (CYANOCOBALAMIN) 1000 MCG tablet Take 1,000 mcg by mouth daily.      No current facility-administered medications for this visit.     Allergies:   Aspirin; Iron; Lescol [fluvastatin sodium]; Pentazocine lactate; and Vytorin [ezetimibe-simvastatin]   Social History: Social History   Socioeconomic History  . Marital status: Married    Spouse name: Not on file  . Number of children: 2  . Years of education: Not on file  . Highest education level: Not on file  Occupational History  . Occupation: Retired     Fish farm manager: RETIRED  Social Needs  . Financial resource strain: Not on file  . Food insecurity:    Worry: Not on file    Inability: Not on file  . Transportation needs:    Medical: Not on file    Non-medical: Not on file  Tobacco Use  . Smoking status: Former Smoker    Types: Cigars  . Smokeless tobacco: Never Used  Substance and Sexual Activity  . Alcohol use: No  . Drug use: No  . Sexual activity: Not on file  Lifestyle  . Physical activity:    Days per week: Not on file    Minutes per session: Not on file  . Stress: Not on file  Relationships  . Social connections:    Talks on phone: Not on file    Gets together: Not on file    Attends religious service: Not on file    Active member of club or organization: Not on file    Attends meetings of clubs or organizations: Not on file    Relationship status: Not on file  . Intimate partner violence:    Fear of current or ex partner: Not on file    Emotionally abused: Not on file    Physically abused: Not on file    Forced sexual activity: Not on file  Other Topics Concern  . Not on file  Social History Narrative   Lives in [place] with spouse.  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 Colon cancer Father   . Diabetes Sister   . Colon polyps Brother   . Pancreatic cancer Brother 64    Review of Systems: All other systems  reviewed and are otherwise negative except as noted above.   Physical Exam: VS:  BP 118/64   Pulse (!) 103   Ht 5\' 11"  (1.803 m)   Wt 155 lb (70.3 kg)   SpO2 97%   BMI 21.62 kg/m  , BMI Body mass index is 21.62 kg/m.  GEN- The patient  is elderly appearing, alert and oriented x 3 today.   HEENT: normocephalic, atraumatic; sclera clear, conjunctiva pink; hearing intact; oropharynx clear; neck supple  Lungs- Clear to ausculation bilaterally, normal work of breathing.  No wheezes, rales, rhonchi Heart- Tachycardic irregular rate and rhythm  GI- soft, non-tender, non-distended, bowel sounds present, no hepatosplenomegaly Extremities- no clubbing, cyanosis, or edema  MS- no significant deformity or atrophy Skin- warm and dry, no rash or lesion; ICD pocket well healed Psych- euthymic mood, full affect Neuro- strength and sensation are intact  ICD interrogation- reviewed in detail today,  See PACEART report  EKG:  EKG is not ordered today.  Recent Labs: No results found for requested labs within last 8760 hours.   Wt Readings from Last 3 Encounters:  02/27/18 155 lb (70.3 kg)  02/19/18 149 lb (67.6 kg)  08/25/17 148 lb 12.8 oz (67.5 kg)     Other studies Reviewed: Additional studies/ records that were reviewed today include: Dr [name] [name] and Dr Jackalyn Lombard office notes   Assessment and Plan:  1.  Chronic systolic dysfunction euvolemic today Stable on an appropriate medical regimen See Pace Art report No changes today  2.  Permanent atrial fibrillation V rates relatively well controlled He does not wish to make changes today  Not a candidate for [facility] or [facility] 2/2 high bleeding risks   3.  CAD No recent ischemic symptoms  Continue current therapy    Current medicines are reviewed at length with the patient today.   The patient does not have concerns regarding his medicines.  The following changes were made today:  none  Labs/ tests ordered today include: none Orders  Placed This Encounter  Procedures  . CUP PACEART INCLINIC DEVICE CHECK     Disposition:   Follow up with merlin, me in 1 year, Dr [name] [name] as scheduled    Signed, Chanetta Marshall, NP 02/27/2018 1:47 PM  [place]  [id number] [phone] (office) [phone] (fax)

## 2018-02-21 ENCOUNTER — Telehealth: Payer: Self-pay | Admitting: Cardiology

## 2018-02-21 ENCOUNTER — Ambulatory Visit (INDEPENDENT_AMBULATORY_CARE_PROVIDER_SITE_OTHER): Payer: Medicare HMO | Admitting: *Deleted

## 2018-02-21 DIAGNOSIS — R001 Bradycardia, unspecified: Secondary | ICD-10-CM | POA: Diagnosis not present

## 2018-02-21 NOTE — Telephone Encounter (Signed)
Spoke with pt and reminded pt of remote transmission that is due today. Pt verbalized understanding.   

## 2018-02-23 NOTE — Progress Notes (Signed)
Remote pacemaker transmission.   

## 2018-02-26 ENCOUNTER — Encounter: Payer: Self-pay | Admitting: Cardiology

## 2018-02-27 ENCOUNTER — Encounter: Payer: Self-pay | Admitting: Nurse Practitioner

## 2018-02-27 ENCOUNTER — Ambulatory Visit: Payer: Medicare HMO | Admitting: Nurse Practitioner

## 2018-02-27 VITALS — BP 118/64 | HR 103 | Ht 71.0 in | Wt 155.0 lb

## 2018-02-27 DIAGNOSIS — I2510 Atherosclerotic heart disease of native coronary artery without angina pectoris: Secondary | ICD-10-CM | POA: Diagnosis not present

## 2018-02-27 DIAGNOSIS — I5022 Chronic systolic (congestive) heart failure: Secondary | ICD-10-CM | POA: Diagnosis not present

## 2018-02-27 DIAGNOSIS — I4821 Permanent atrial fibrillation: Secondary | ICD-10-CM

## 2018-02-27 DIAGNOSIS — I482 Chronic atrial fibrillation: Secondary | ICD-10-CM | POA: Diagnosis not present

## 2018-02-27 LAB — CUP PACEART INCLINIC DEVICE CHECK
Date Time Interrogation Session: 20190402114732
Implantable Lead Implant Date: 20091103
MDC IDC LEAD IMPLANT DT: 20091103
MDC IDC LEAD LOCATION: 753859
MDC IDC LEAD LOCATION: 753860
MDC IDC PG IMPLANT DT: 20161117
MDC IDC PG SERIAL: 7804513
Pulse Gen Model: 1240

## 2018-02-27 NOTE — Patient Instructions (Addendum)
Medication Instructions:   Your physician recommends that you continue on your current medications as directed. Please refer to the Current Medication list given to you today.   If you need a refill on your cardiac medications before your next appointment, please call your pharmacy.  Labwork: NONE ORDERED  TODAY    Testing/Procedures: NONE ORDERED  TODAY    Follow-Up:  Your physician wants you to follow-up in: [place] will receive a reminder letter in the mail two months in advance. If you don't receive a letter, please call our office to schedule the follow-up appointment.   Remote monitoring is used to monitor your Pacemaker of ICD from home. This monitoring reduces the number of office visits required to check your device to one time per year. It allows [place] to keep an eye on the functioning of your device to ensure it is working properly. You are scheduled for a device check from home on . 05-23-18  You may send your transmission at any time that day. If you have a wireless device, the transmission will be sent automatically. After your physician reviews your transmission, you will receive a postcard with your next transmission date.     Any Other Special Instructions Will Be Listed Below (If Applicable).

## 2018-03-04 LAB — CUP PACEART REMOTE DEVICE CHECK
Battery Voltage: 3.02 V
Brady Statistic RV Percent Paced: 6.1 %
Date Time Interrogation Session: 20190327060015
Implantable Lead Location: 753859
Implantable Lead Model: 5076
Implantable Lead Model: 6947
Lead Channel Impedance Value: 490 Ohm
Lead Channel Sensing Intrinsic Amplitude: 12 mV
Lead Channel Setting Pacing Amplitude: 2.5 V
Lead Channel Setting Pacing Pulse Width: 0.4 ms
MDC IDC LEAD IMPLANT DT: 20091103
MDC IDC LEAD IMPLANT DT: 20091103
MDC IDC LEAD LOCATION: 753860
MDC IDC MSMT BATTERY REMAINING LONGEVITY: 138 mo
MDC IDC MSMT BATTERY REMAINING PERCENTAGE: 95.5 %
MDC IDC MSMT LEADCHNL RV PACING THRESHOLD AMPLITUDE: 0.75 V
MDC IDC MSMT LEADCHNL RV PACING THRESHOLD PULSEWIDTH: 0.4 ms
MDC IDC PG IMPLANT DT: 20161117
MDC IDC PG SERIAL: 7804513
MDC IDC SET LEADCHNL RV SENSING SENSITIVITY: 2 mV
Pulse Gen Model: 1240

## 2018-03-05 ENCOUNTER — Other Ambulatory Visit: Payer: Self-pay | Admitting: Cardiology

## 2018-03-06 NOTE — Telephone Encounter (Signed)
REFILL 

## 2018-04-10 DIAGNOSIS — Z950 Presence of cardiac pacemaker: Secondary | ICD-10-CM | POA: Diagnosis not present

## 2018-04-10 DIAGNOSIS — I480 Paroxysmal atrial fibrillation: Secondary | ICD-10-CM | POA: Diagnosis not present

## 2018-04-10 DIAGNOSIS — Z6823 Body mass index (BMI) 23.0-23.9, adult: Secondary | ICD-10-CM | POA: Diagnosis not present

## 2018-04-10 DIAGNOSIS — I10 Essential (primary) hypertension: Secondary | ICD-10-CM | POA: Diagnosis not present

## 2018-04-10 DIAGNOSIS — I2510 Atherosclerotic heart disease of native coronary artery without angina pectoris: Secondary | ICD-10-CM | POA: Diagnosis not present

## 2018-04-10 DIAGNOSIS — R7301 Impaired fasting glucose: Secondary | ICD-10-CM | POA: Diagnosis not present

## 2018-04-10 DIAGNOSIS — I509 Heart failure, unspecified: Secondary | ICD-10-CM | POA: Diagnosis not present

## 2018-04-16 ENCOUNTER — Other Ambulatory Visit: Payer: Self-pay | Admitting: Cardiology

## 2018-05-01 ENCOUNTER — Other Ambulatory Visit: Payer: Self-pay | Admitting: *Deleted

## 2018-05-01 MED ORDER — SPIRONOLACTONE 25 MG PO TABS: 25.0000 mg | ORAL_TABLET | Freq: Every day | ORAL | 3 refills | Status: DC

## 2018-05-09 DIAGNOSIS — H35033 Hypertensive retinopathy, bilateral: Secondary | ICD-10-CM | POA: Diagnosis not present

## 2018-05-09 DIAGNOSIS — H524 Presbyopia: Secondary | ICD-10-CM | POA: Diagnosis not present

## 2018-05-23 ENCOUNTER — Ambulatory Visit (INDEPENDENT_AMBULATORY_CARE_PROVIDER_SITE_OTHER): Payer: Medicare HMO | Admitting: *Deleted

## 2018-05-23 DIAGNOSIS — I255 Ischemic cardiomyopathy: Secondary | ICD-10-CM | POA: Diagnosis not present

## 2018-05-23 DIAGNOSIS — I428 Other cardiomyopathies: Secondary | ICD-10-CM

## 2018-05-23 NOTE — Progress Notes (Signed)
Remote pacemaker transmission.   

## 2018-05-24 ENCOUNTER — Encounter: Payer: Self-pay | Admitting: Cardiology

## 2018-05-27 LAB — CUP PACEART REMOTE DEVICE CHECK
Battery Remaining Longevity: 137 mo
Battery Voltage: 3.02 V
Brady Statistic RV Percent Paced: 6.1 %
Date Time Interrogation Session: 20190626060013
Implantable Lead Location: 753859
Implantable Lead Model: 5076
Lead Channel Setting Pacing Amplitude: 2.5 V
Lead Channel Setting Pacing Pulse Width: 0.4 ms
Lead Channel Setting Sensing Sensitivity: 2 mV
MDC IDC LEAD IMPLANT DT: 20091103
MDC IDC LEAD IMPLANT DT: 20091103
MDC IDC LEAD LOCATION: 753860
MDC IDC MSMT BATTERY REMAINING PERCENTAGE: 95.5 %
MDC IDC MSMT LEADCHNL RV IMPEDANCE VALUE: 450 Ohm
MDC IDC MSMT LEADCHNL RV PACING THRESHOLD AMPLITUDE: 0.75 V
MDC IDC MSMT LEADCHNL RV PACING THRESHOLD PULSEWIDTH: 0.4 ms
MDC IDC MSMT LEADCHNL RV SENSING INTR AMPL: 12 mV
MDC IDC PG IMPLANT DT: 20161117
MDC IDC PG SERIAL: 7804513
Pulse Gen Model: 1240

## 2018-06-12 DIAGNOSIS — H35033 Hypertensive retinopathy, bilateral: Secondary | ICD-10-CM | POA: Diagnosis not present

## 2018-06-18 DIAGNOSIS — R7301 Impaired fasting glucose: Secondary | ICD-10-CM | POA: Diagnosis not present

## 2018-06-18 DIAGNOSIS — Z125 Encounter for screening for malignant neoplasm of prostate: Secondary | ICD-10-CM | POA: Diagnosis not present

## 2018-06-18 DIAGNOSIS — E559 Vitamin D deficiency, unspecified: Secondary | ICD-10-CM | POA: Diagnosis not present

## 2018-06-18 DIAGNOSIS — E7849 Other hyperlipidemia: Secondary | ICD-10-CM | POA: Diagnosis not present

## 2018-06-18 DIAGNOSIS — I10 Essential (primary) hypertension: Secondary | ICD-10-CM | POA: Diagnosis not present

## 2018-06-18 DIAGNOSIS — I480 Paroxysmal atrial fibrillation: Secondary | ICD-10-CM | POA: Diagnosis not present

## 2018-06-18 DIAGNOSIS — R82998 Other abnormal findings in urine: Secondary | ICD-10-CM | POA: Diagnosis not present

## 2018-06-18 DIAGNOSIS — R946 Abnormal results of thyroid function studies: Secondary | ICD-10-CM | POA: Diagnosis not present

## 2018-06-25 DIAGNOSIS — R808 Other proteinuria: Secondary | ICD-10-CM | POA: Diagnosis not present

## 2018-06-25 DIAGNOSIS — D508 Other iron deficiency anemias: Secondary | ICD-10-CM | POA: Diagnosis not present

## 2018-06-25 DIAGNOSIS — C189 Malignant neoplasm of colon, unspecified: Secondary | ICD-10-CM | POA: Diagnosis not present

## 2018-06-25 DIAGNOSIS — Z950 Presence of cardiac pacemaker: Secondary | ICD-10-CM | POA: Diagnosis not present

## 2018-06-25 DIAGNOSIS — I509 Heart failure, unspecified: Secondary | ICD-10-CM | POA: Diagnosis not present

## 2018-06-25 DIAGNOSIS — J449 Chronic obstructive pulmonary disease, unspecified: Secondary | ICD-10-CM | POA: Diagnosis not present

## 2018-06-25 DIAGNOSIS — R197 Diarrhea, unspecified: Secondary | ICD-10-CM | POA: Diagnosis not present

## 2018-06-25 DIAGNOSIS — I255 Ischemic cardiomyopathy: Secondary | ICD-10-CM | POA: Diagnosis not present

## 2018-06-25 DIAGNOSIS — Z0000 Encounter for general adult medical examination without abnormal findings: Secondary | ICD-10-CM | POA: Diagnosis not present

## 2018-06-25 DIAGNOSIS — I480 Paroxysmal atrial fibrillation: Secondary | ICD-10-CM | POA: Diagnosis not present

## 2018-07-01 ENCOUNTER — Encounter (HOSPITAL_COMMUNITY): Payer: Self-pay | Admitting: *Deleted

## 2018-07-01 ENCOUNTER — Emergency Department (HOSPITAL_COMMUNITY): Payer: Medicare HMO

## 2018-07-01 ENCOUNTER — Other Ambulatory Visit: Payer: Self-pay

## 2018-07-01 ENCOUNTER — Inpatient Hospital Stay (HOSPITAL_COMMUNITY)
Admission: EM | Admit: 2018-07-01 | Discharge: 2018-07-03 | DRG: 378 | Disposition: A | Discharge status: Home / Self Care | Payer: Medicare HMO | Attending: Internal Medicine | Admitting: Internal Medicine

## 2018-07-01 DIAGNOSIS — I5022 Chronic systolic (congestive) heart failure: Secondary | ICD-10-CM | POA: Diagnosis present

## 2018-07-01 DIAGNOSIS — I255 Ischemic cardiomyopathy: Secondary | ICD-10-CM | POA: Diagnosis not present

## 2018-07-01 DIAGNOSIS — Z886 Allergy status to analgesic agent status: Secondary | ICD-10-CM | POA: Diagnosis not present

## 2018-07-01 DIAGNOSIS — I10 Essential (primary) hypertension: Secondary | ICD-10-CM | POA: Diagnosis present

## 2018-07-01 DIAGNOSIS — Z95810 Presence of automatic (implantable) cardiac defibrillator: Secondary | ICD-10-CM

## 2018-07-01 DIAGNOSIS — K921 Melena: Principal | ICD-10-CM | POA: Diagnosis present

## 2018-07-01 DIAGNOSIS — I2510 Atherosclerotic heart disease of native coronary artery without angina pectoris: Secondary | ICD-10-CM | POA: Diagnosis not present

## 2018-07-01 DIAGNOSIS — Z87891 Personal history of nicotine dependence: Secondary | ICD-10-CM

## 2018-07-01 DIAGNOSIS — Z951 Presence of aortocoronary bypass graft: Secondary | ICD-10-CM | POA: Diagnosis not present

## 2018-07-01 DIAGNOSIS — I480 Paroxysmal atrial fibrillation: Secondary | ICD-10-CM | POA: Diagnosis not present

## 2018-07-01 DIAGNOSIS — I482 Chronic atrial fibrillation: Secondary | ICD-10-CM | POA: Diagnosis not present

## 2018-07-01 DIAGNOSIS — D62 Acute posthemorrhagic anemia: Secondary | ICD-10-CM | POA: Diagnosis present

## 2018-07-01 DIAGNOSIS — I252 Old myocardial infarction: Secondary | ICD-10-CM | POA: Diagnosis not present

## 2018-07-01 DIAGNOSIS — I959 Hypotension, unspecified: Secondary | ICD-10-CM

## 2018-07-01 DIAGNOSIS — Z9221 Personal history of antineoplastic chemotherapy: Secondary | ICD-10-CM

## 2018-07-01 DIAGNOSIS — Z79899 Other long term (current) drug therapy: Secondary | ICD-10-CM | POA: Diagnosis not present

## 2018-07-01 DIAGNOSIS — K625 Hemorrhage of anus and rectum: Secondary | ICD-10-CM | POA: Diagnosis not present

## 2018-07-01 DIAGNOSIS — Z923 Personal history of irradiation: Secondary | ICD-10-CM

## 2018-07-01 DIAGNOSIS — K922 Gastrointestinal hemorrhage, unspecified: Secondary | ICD-10-CM | POA: Diagnosis not present

## 2018-07-01 DIAGNOSIS — I110 Hypertensive heart disease with heart failure: Secondary | ICD-10-CM | POA: Diagnosis not present

## 2018-07-01 DIAGNOSIS — R109 Unspecified abdominal pain: Secondary | ICD-10-CM | POA: Diagnosis not present

## 2018-07-01 DIAGNOSIS — I4891 Unspecified atrial fibrillation: Secondary | ICD-10-CM | POA: Diagnosis present

## 2018-07-01 LAB — CBC WITH DIFFERENTIAL/PLATELET
Basophils Absolute: 0 10*3/uL (ref 0.0–0.1)
Basophils Relative: 0 %
Eosinophils Absolute: 0.1 10*3/uL (ref 0.0–0.7)
Eosinophils Relative: 1 %
HEMATOCRIT: 34.1 % — AB (ref 39.0–52.0)
Hemoglobin: 11 g/dL — ABNORMAL LOW (ref 13.0–17.0)
Lymphocytes Relative: 8 %
Lymphs Abs: 0.5 10*3/uL — ABNORMAL LOW (ref 0.7–4.0)
MCH: 31.2 pg (ref 26.0–34.0)
MCHC: 32.3 g/dL (ref 30.0–36.0)
MCV: 96.6 fL (ref 78.0–100.0)
MONO ABS: 0.6 10*3/uL (ref 0.1–1.0)
Monocytes Relative: 8 %
NEUTROS ABS: 5.8 10*3/uL (ref 1.7–7.7)
NEUTROS PCT: 83 %
Platelets: 210 10*3/uL (ref 150–400)
RBC: 3.53 MIL/uL — ABNORMAL LOW (ref 4.22–5.81)
RDW: 14.5 % (ref 11.5–15.5)
WBC: 6.9 10*3/uL (ref 4.0–10.5)

## 2018-07-01 LAB — COMPREHENSIVE METABOLIC PANEL
ALT: 19 U/L (ref 0–44)
ANION GAP: 8 (ref 5–15)
AST: 30 U/L (ref 15–41)
Albumin: 3.4 g/dL — ABNORMAL LOW (ref 3.5–5.0)
Alkaline Phosphatase: 36 U/L — ABNORMAL LOW (ref 38–126)
BILIRUBIN TOTAL: 0.5 mg/dL (ref 0.3–1.2)
BUN: 27 mg/dL — AB (ref 8–23)
CO2: 27 mmol/L (ref 22–32)
Calcium: 9.1 mg/dL (ref 8.9–10.3)
Chloride: 107 mmol/L (ref 98–111)
Creatinine, Ser: 1.19 mg/dL (ref 0.61–1.24)
GFR calc Af Amer: >60 mL/min (ref >60)
GFR, EST NON AFRICAN AMERICAN: 54 mL/min — AB (ref >60)
Glucose, Bld: 120 mg/dL — ABNORMAL HIGH (ref 70–99)
POTASSIUM: 4.2 mmol/L (ref 3.5–5.1)
Sodium: 142 mmol/L (ref 135–145)
TOTAL PROTEIN: 5.9 g/dL — AB (ref 6.5–8.1)

## 2018-07-01 LAB — I-STAT CG4 LACTIC ACID, ED: Lactic Acid, Venous: 1.56 mmol/L (ref 0.5–1.9)

## 2018-07-01 LAB — TYPE AND SCREEN
ABO/RH(D): O POS
Antibody Screen: NEGATIVE

## 2018-07-01 LAB — LIPASE, BLOOD: LIPASE: 19 U/L (ref 11–51)

## 2018-07-01 LAB — POC OCCULT BLOOD, ED: FECAL OCCULT BLD: POSITIVE — AB

## 2018-07-01 LAB — PROTIME-INR
INR: 1.07
Prothrombin Time: 13.8 seconds (ref 11.4–15.2)

## 2018-07-01 MED ORDER — SODIUM CHLORIDE 0.9 % IV BOLUS
500.0000 mL | Freq: Once | INTRAVENOUS | Status: AC
  Administered 2018-07-01: 500 mL via INTRAVENOUS

## 2018-07-01 MED ORDER — SODIUM CHLORIDE 0.9 % IV SOLN
80.0000 mg | Freq: Once | INTRAVENOUS | Status: AC
  Administered 2018-07-01: 80 mg via INTRAVENOUS
  Filled 2018-07-01: qty 80

## 2018-07-01 MED ORDER — IOPAMIDOL (ISOVUE-300) INJECTION 61%
INTRAVENOUS | Status: AC
  Administered 2018-07-01: 19:00:00
  Filled 2018-07-01: qty 100

## 2018-07-01 MED ORDER — DIGOXIN 0.0625 MG HALF TABLET
62.5000 ug | ORAL_TABLET | Freq: Every day | ORAL | Status: DC
  Administered 2018-07-02 – 2018-07-03 (×2): 62.5 ug via ORAL
  Filled 2018-07-01 (×2): qty 1

## 2018-07-01 MED ORDER — ACETAMINOPHEN 650 MG RE SUPP: 650.0000 mg | Freq: Four times a day (QID) | RECTAL | Status: DC | PRN

## 2018-07-01 MED ORDER — METOPROLOL SUCCINATE ER 100 MG PO TB24
100.0000 mg | ORAL_TABLET | Freq: Every day | ORAL | Status: DC
  Administered 2018-07-02 – 2018-07-03 (×2): 100 mg via ORAL
  Filled 2018-07-01 (×2): qty 1

## 2018-07-01 MED ORDER — SODIUM CHLORIDE 0.9% FLUSH: 3.0000 mL | INTRAVENOUS | Status: DC | PRN

## 2018-07-01 MED ORDER — SODIUM CHLORIDE 0.9% FLUSH
3.0000 mL | Freq: Two times a day (BID) | INTRAVENOUS | Status: DC
  Administered 2018-07-01 – 2018-07-02 (×2): 3 mL via INTRAVENOUS

## 2018-07-01 MED ORDER — PANTOPRAZOLE SODIUM 40 MG IV SOLR: 40.0000 mg | Freq: Two times a day (BID) | INTRAVENOUS | Status: DC

## 2018-07-01 MED ORDER — VITAMIN D3 25 MCG (1000 UNIT) PO TABS
1000.0000 [IU] | ORAL_TABLET | Freq: Every day | ORAL | Status: DC
  Administered 2018-07-02 – 2018-07-03 (×2): 1000 [IU] via ORAL
  Filled 2018-07-01 (×2): qty 1

## 2018-07-01 MED ORDER — ALBUTEROL SULFATE HFA 108 (90 BASE) MCG/ACT IN AERS: 2.0000 | INHALATION_SPRAY | RESPIRATORY_TRACT | Status: DC | PRN

## 2018-07-01 MED ORDER — IOPAMIDOL (ISOVUE-300) INJECTION 61%
100.0000 mL | Freq: Once | INTRAVENOUS | Status: AC | PRN
  Administered 2018-07-01: 100 mL via INTRAVENOUS

## 2018-07-01 MED ORDER — NITROGLYCERIN 0.4 MG SL SUBL: 0.4000 mg | SUBLINGUAL_TABLET | SUBLINGUAL | Status: DC | PRN

## 2018-07-01 MED ORDER — SODIUM CHLORIDE 0.9 % IV SOLN
8.0000 mg/h | INTRAVENOUS | Status: DC
  Administered 2018-07-01: 8 mg/h via INTRAVENOUS
  Filled 2018-07-01: qty 80

## 2018-07-01 MED ORDER — ALBUTEROL SULFATE (2.5 MG/3ML) 0.083% IN NEBU: 2.5000 mg | INHALATION_SOLUTION | RESPIRATORY_TRACT | Status: DC | PRN

## 2018-07-01 MED ORDER — TEMAZEPAM 7.5 MG PO CAPS: 7.5000 mg | ORAL_CAPSULE | Freq: Every evening | ORAL | Status: DC | PRN

## 2018-07-01 MED ORDER — SODIUM CHLORIDE 0.9 % IV SOLN: 250.0000 mL | INTRAVENOUS | Status: DC | PRN

## 2018-07-01 MED ORDER — ACETAMINOPHEN 325 MG PO TABS: 650.0000 mg | ORAL_TABLET | Freq: Four times a day (QID) | ORAL | Status: DC | PRN

## 2018-07-01 NOTE — ED Provider Notes (Signed)
Emergency Department Provider Note   I have reviewed the triage vital signs and the nursing notes.   HISTORY  Chief Complaint Blood In Stools   HPI [name] [name] is a 84 y.o. male with PMH of PAF not anticoagulated, CHF (EF 35-40%), GERD, HTN, and remote history of colon cancer presents to the emergency department for evaluation of GI bleeding starting last night.  The patient states he has bright red blood when he wipes after bowel movements but notes that his stool is black when looking at in the toilet.  His stools are loose.  He states he has had 4-5 episodes of black bowel movements since midnight.  Denies any abdominal pain.  No fevers or chills.  No prior history of GI bleeding.  He has had 2 surgeries for colon cancer in the 1980s and 90s.  No recent chemotherapy. No vomiting blood or other material.   Past Medical History:  Diagnosis Date  . CAD (coronary artery disease)    Old inferolateral MI s/p CABG in 2002  . CAP (community acquired pneumonia) 10/03/2011  . Cardiomyopathy    s/p ICD 09/30/08; EF 35 to 40% per echo in October 2021  . Chronic systolic CHF (congestive heart failure) ([place]) 07/24/2013  . Colon cancer ([place])    s/p chemo, RT, sugery  . Colorectal anastomotic stricture   . Diverticulosis   . ED (erectile dysfunction)   . GERD (gastroesophageal reflux disease)    rarely  . HTN (hypertension)   . ICD (implantable cardiac defibrillator) discharge October 2012   ICD shock for afib with RVR  . Iron deficiency anemia   . Paroxysmal atrial fibrillation (HCC)    controlled with amiodarone, not a coumadin candidate due to GI bleeding    Patient Active Problem List   Diagnosis Date Noted  . Lower GI bleed 07/01/2018  . Symptomatic bradycardia 10/15/2015  . ICD in place- MDT 2009 07/03/2014  . History of GI bleed-  07/03/2014  . Essential hypertension 07/03/2014  . Acute on chronic systolic (congestive) heart failure ([place]) 10/15/2013  . Respiratory failure,  acute ([place]) 10/15/2013  . Bronchitis, acute 10/14/2013  . Oral pharyngeal candidiasis 10/14/2013  . Dizziness 10/14/2013  . Chronic systolic CHF (congestive heart failure) ([place]) 07/24/2013  . Ischemic cardiomyopathy 10/14/2011  . Automatic implantable cardioverter/defibrillator (AICD) activation 10/02/2011  . Atrial fibrillation, chronic ([place]) 03/21/2011  . CAD (coronary artery disease) 03/21/2011    Past Surgical History:  Procedure Laterality Date  . APPENDECTOMY    . BACK SURGERY    . CARDIAC CATHETERIZATION  11/07/2000   EF 32%  . CARDIAC DEFIBRILLATOR PLACEMENT  09/30/08   MDT dual chamber ICD placed by Dr Rayann Heman  . CARDIOVASCULAR STRESS TEST  08/06/2008   EF 30%  . CHOLECYSTECTOMY    . COLON RESECTION     x2  . CORONARY ARTERY BYPASS GRAFT    . EP IMPLANTABLE DEVICE N/A 10/15/2015   Procedure:  PPM Generator Changeout;  Surgeon: [name], MD;  Location: [facility];  Service: Cardiovascular;  Laterality: N/A;  . HERNIA REPAIR    . US ECHOCARDIOGRAPHY  08/11/2008   EF 30-35%  . VASECTOMY      Allergies Aspirin; Iron; Lescol [fluvastatin sodium]; Pentazocine lactate; and Vytorin [ezetimibe-simvastatin]  Family History  Problem Relation Age of Onset  . Colon cancer Father   . Diabetes Sister   . Colon polyps Brother   . Pancreatic cancer Brother 68    Social History  Social History   Tobacco Use  . Smoking status: Former Smoker    Types: Cigars  . Smokeless tobacco: Never Used  Substance Use Topics  . Alcohol use: No  . Drug use: No    Review of Systems  Constitutional: No fever/chills Eyes: No visual changes. ENT: No sore throat. Cardiovascular: Denies chest pain. Respiratory: Denies shortness of breath. Gastrointestinal: No abdominal pain.  No nausea, no vomiting. Positive diarrhea with concern for GI bleeding.  No constipation. Genitourinary: Negative for dysuria. Musculoskeletal: Negative for back pain. Skin: Negative for  rash. Neurological: Negative for headaches, focal weakness or numbness.  10-point ROS otherwise negative.  ____________________________________________   PHYSICAL EXAM:  VITAL SIGNS: ED Triage Vitals  Enc Vitals Group     BP 07/01/18 1448 (!) 87/73     Pulse Rate 07/01/18 1448 84     Resp 07/01/18 1448 16     Temp 07/01/18 1448 98.8 F (37.1 C)     Temp Source 07/01/18 1448 Oral     SpO2 07/01/18 1448 99 %     Weight 07/01/18 1451 149 lb (67.6 kg)     Height 07/01/18 1451 5\' 11"  (1.803 m)     Pain Score 07/01/18 1451 0   Constitutional: Alert and oriented. Well appearing and in no acute distress. Eyes: Conjunctivae are normal.  Head: Atraumatic. Nose: No congestion/rhinnorhea. Mouth/Throat: Mucous membranes are moist. Neck: No stridor. Cardiovascular: Normal rate, regular rhythm. Good peripheral circulation. Grossly normal heart sounds.   Respiratory: Normal respiratory effort.  No retractions. Lungs CTAB. Gastrointestinal: Soft and nontender. No distention. Rectal exam with small volume BRB with no melena. No fissures or hemorrhoids.  Musculoskeletal: No lower extremity tenderness nor edema. No gross deformities of extremities. Neurologic:  Normal speech and language. No gross focal neurologic deficits are appreciated.  Skin:  Skin is warm, dry and intact. No rash noted.  ____________________________________________   LABS (all labs ordered are listed, but only abnormal results are displayed)  Labs Reviewed  COMPREHENSIVE METABOLIC PANEL - Abnormal; Notable for the following components:      Result Value   Glucose, Bld 120 (*)    BUN 27 (*)    Total Protein 5.9 (*)    Albumin 3.4 (*)    Alkaline Phosphatase 36 (*)    GFR calc non Af Amer 54 (*)    All other components within normal limits  CBC WITH DIFFERENTIAL/PLATELET - Abnormal; Notable for the following components:   RBC 3.53 (*)    Hemoglobin 11.0 (*)    HCT 34.1 (*)    Lymphs Abs 0.5 (*)    All other  components within normal limits  POC OCCULT BLOOD, ED - Abnormal; Notable for the following components:   Fecal Occult Bld POSITIVE (*)    All other components within normal limits  LIPASE, BLOOD  PROTIME-INR  URINALYSIS, ROUTINE W REFLEX MICROSCOPIC  I-STAT CG4 LACTIC ACID, ED  TYPE AND SCREEN   ____________________________________________  EKG   EKG Interpretation  Date/Time:  Sunday July 01 2018 15:33:39 EDT Ventricular Rate:  73 PR Interval:    QRS Duration: 115 QT Interval:  393 QTC Calculation: 433 R Axis:   15 Text Interpretation:  Atrial fibrillation Ventricular premature complex Incomplete left bundle branch block Borderline low voltage, extremity leads LVH with secondary repolarization abnormality Similar to prior. No STEMI.  Confirmed by Nanda Quinton [phone]) on 07/01/2018 3:36:42 PM       ____________________________________________  RADIOLOGY  Ct Abdomen Pelvis W Contrast  Result Date: 07/01/2018 CLINICAL DATA:  Abdominal pain suspected diverticulitis, colon cancer, started passing blood in loose stools last night, history coronary artery disease post MI and CABG, cardiomyopathy, chronic systolic CHF, hypertension, atrial fibrillation, former smoker EXAM: CT ABDOMEN AND PELVIS WITH CONTRAST TECHNIQUE: Multidetector CT imaging of the abdomen and pelvis was performed using the standard protocol following bolus administration of intravenous contrast. Sagittal and coronal MPR images reconstructed from axial data set. CONTRAST:  1106mL ISOVUE-300 IOPAMIDOL (ISOVUE-300) INJECTION 61% IV. No oral contrast. COMPARISON:  02/22/2008 FINDINGS: Lower chest: Bibasilar interstitial infiltrates which could represent infection or chronic interstitial lung disease, progressive since 2009. Hepatobiliary: Gallbladder surgically absent. Significant biliary dilatation with CBD 15 mm diameter tapering to 11 mm distally. Mild intrahepatic biliary dilatation. No focal hepatic lesions. Pancreas:  Atrophic pancreas. Spleen: Normal appearance.  Surgical clips at hilum. Adrenals/Urinary Tract: Exophytic intermediate attenuation nodule posterior LEFT kidney 13 x 11 mm image 27 previously 9 x 7 mm, demonstrating washout on delayed images question small renal neoplasm. Additional BILATERAL renal cysts. No hydronephrosis or hydroureter. Unremarkable adrenal glands, ureters, and bladder Stomach/Bowel: Prior ileocolic resection with anastomosis in the RIGHT mid abdomen. Few dilated loops of small bowel are seen within the pelvis with a questionable transition zone to normal caliber small bowel in the RIGHT mid pelvis image 51. Rectum and stomach incompletely distended with suboptimal assessment of wall thickness. Minimal sigmoid diverticulosis. Remaining bowel loops unremarkable. Vascular/Lymphatic: Extensive atherosclerotic calcifications aorta, iliac arteries, visceral artery origins, and coronary arteries. Enlargement of cardiac chambers with pacemaker leads RIGHT atrium and RIGHT ventricle. No adenopathy. Reproductive: Mild prostatic enlargement. Other: No free air or free fluid. No definite hernia or acute inflammatory process. Musculoskeletal: Osseous demineralization with scoliosis and degenerative changes of the lumbar spine. IMPRESSION: Increased biliary dilatation since 2009, CBD 15 mm diameter, recommend correlation with LFTs. Minimal sigmoid diverticulosis. Unable to exclude gastric and rectal wall thickening though these could be areas of artifact from underdistention. Extensive atherosclerotic calcifications aorta, branch vessels, and coronary arteries. 13 x 11 mm enhancing nodule at the posterior margin of the LEFT kidney concerning for a small renal neoplasm. Electronically Signed   By: Lavonia Dana M.D.   On: 07/01/2018 17:06    ____________________________________________   PROCEDURES  Procedure(s) performed:   Procedures  CRITICAL CARE Performed by: Margette Fast Total critical care  time: 35 minutes Critical care time was exclusive of separately billable procedures and treating other patients. Critical care was necessary to treat or prevent imminent or life-threatening deterioration. Critical care was time spent personally by me on the following activities: development of treatment plan with patient and/or surrogate as well as nursing, discussions with consultants, evaluation of patient's response to treatment, examination of patient, obtaining history from patient or surrogate, ordering and performing treatments and interventions, ordering and review of laboratory studies, ordering and review of radiographic studies, pulse oximetry and re-evaluation of patient's condition.  Nanda Quinton, MD Emergency Medicine  ____________________________________________   INITIAL IMPRESSION / ASSESSMENT AND PLAN / ED COURSE  Pertinent labs & imaging results that were available during my care of the patient were reviewed by me and considered in my medical decision making (see chart for details).  Patient presents to the emergency department with concern for GI bleeding. Patient is hypotensive in triage but awake, alert, and with few acute anemia symptoms at this time.  He has no abdominal pain and has a remote history of colon cancer. No infection symptoms. Some small volume BRB on exam  but no melena although patient describes black stools at home. Plan for protonix, gentle IVF, labs, and CT abdomen pelvis.   Hb 11. CT with with possible renal neoplasm and non-specific bowel wall thickening vs artifact. No focal tenderness in this area. LFTs normal. Will likely admit for obs and further IVF. Will discuss with GI.   05:45 PM Spoke with Dr. Michail Sermon with [facility].  Agrees with plan to observation night, continue resuscitation, they will consult in the morning unless the patient acutely worsens.   Discussed patient's case with Hospitalist to request admission. Patient and family (if present)  updated with plan. Care transferred to [facility] service.  I reviewed all nursing notes, vitals, pertinent old records, EKGs, labs, imaging (as available).  ____________________________________________  FINAL CLINICAL IMPRESSION(S) / ED DIAGNOSES  Final diagnoses:  Gastrointestinal hemorrhage, unspecified gastrointestinal hemorrhage type  Hypotension, unspecified hypotension type     MEDICATIONS GIVEN DURING THIS VISIT:  Medications  pantoprazole (PROTONIX) 80 mg in sodium chloride 0.9 % 250 mL (0.32 mg/mL) infusion (8 mg/hr Intravenous New Bag/Given 07/01/18 1606)  pantoprazole (PROTONIX) injection 40 mg (has no administration in time range)  iopamidol (ISOVUE-300) 61 % injection (has no administration in time range)  sodium chloride 0.9 % bolus 500 mL (0 mLs Intravenous Stopped 07/01/18 1651)  pantoprazole (PROTONIX) 80 mg in sodium chloride 0.9 % 100 mL IVPB (80 mg Intravenous New Bag/Given 07/01/18 1543)  iopamidol (ISOVUE-300) 61 % injection 100 mL (100 mLs Intravenous Contrast Given 07/01/18 1638)    Note:  This document was prepared using Dragon voice recognition software and may include unintentional dictation errors.  Nanda Quinton, MD Emergency Medicine    Caydence Koenig, [facility], MD 07/01/18 [phone]

## 2018-07-01 NOTE — ED Notes (Signed)
Patient transported to CT 

## 2018-07-01 NOTE — ED Notes (Addendum)
Patient placed on cardiac monitor. Urinal at bedside. Patient aware we need urine sample. Long,MD aware of patient bp.

## 2018-07-01 NOTE — ED Notes (Signed)
ED TO INPATIENT HANDOFF REPORT  Name/Age/Gender [name] [name] 84 y.o. male  Code Status Code Status History    Date Active Date Inactive Code Status Order ID Comments User Context   10/15/2015 1521 10/15/2015 2014 Full Code [id number]  [name], MD Inpatient   10/14/2013 1751 10/17/2013 1623 Full Code [id number]  Elmarie Shiley, MD Inpatient   10/02/2011 1826 10/06/2011 1716 Full Code [id number]  Valente David, RN Inpatient      Home/SNF/Other Home  Chief Complaint blood in stool  Level of Care/Admitting Diagnosis ED Disposition    ED Disposition Condition [place]: [facility] [100102]  Level of Care: Telemetry [5]  Admit to tele based on following criteria: Complex arrhythmia (Bradycardia/Tachycardia)  Diagnosis: Lower GI bleed [622297]  Admitting Physician: Charlynne Cousins [[id number]]  Attending Physician: Charlynne Cousins [[id number]]  Estimated length of stay: past midnight tomorrow  Certification:: I certify this patient will need inpatient services for at least 2 midnights  PT Class (Do Not Modify): Inpatient [101]  PT Acc Code (Do Not Modify): Private [1]       Medical History Past Medical History:  Diagnosis Date  . CAD (coronary artery disease)    Old inferolateral MI s/p CABG in 2002  . CAP (community acquired pneumonia) 10/03/2011  . Cardiomyopathy    s/p ICD 09/30/08; EF 35 to 40% per echo in October 2021  . Chronic systolic CHF (congestive heart failure) ([place]) 07/24/2013  . Colon cancer ([place])    s/p chemo, RT, sugery  . Colorectal anastomotic stricture   . Diverticulosis   . ED (erectile dysfunction)   . GERD (gastroesophageal reflux disease)    rarely  . HTN (hypertension)   . ICD (implantable cardiac defibrillator) discharge October 2012   ICD shock for afib with RVR  . Iron deficiency anemia   . Paroxysmal atrial fibrillation (HCC)    controlled with amiodarone, not a coumadin candidate due to GI  bleeding    Allergies Allergies  Allergen Reactions  . Aspirin     Bleeding   . Iron     Stomach issues  . Lescol [Fluvastatin Sodium]     Dizziness  . Pentazocine Lactate     unknown  . Vytorin [Ezetimibe-Simvastatin]     Leg cramps     IV Location/Drains/Wounds Patient Lines/Drains/Airways Status   Active Line/Drains/Airways    Name:   Placement date:   Placement time:   Site:   Days:   Peripheral IV 07/01/18 Left Antecubital   07/01/18    1525    Antecubital   less than 1          Labs/Imaging Results for orders placed or performed during the hospital encounter of 07/01/18 (from the past 48 hour(s))  Comprehensive metabolic panel     Status: Abnormal   Collection Time: 07/01/18  3:10 PM  Result Value Ref Range   Sodium 142 135 - 145 mmol/L   Potassium 4.2 3.5 - 5.1 mmol/L   Chloride 107 98 - 111 mmol/L   CO2 27 22 - 32 mmol/L   Glucose, Bld 120 (H) 70 - 99 mg/dL   BUN 27 (H) 8 - 23 mg/dL   Creatinine, Ser 1.19 0.61 - 1.24 mg/dL   Calcium 9.1 8.9 - 10.3 mg/dL   Total Protein 5.9 (L) 6.5 - 8.1 g/dL   Albumin 3.4 (L) 3.5 - 5.0 g/dL   AST 30 15 - 41 U/L   ALT  19 0 - 44 U/L   Alkaline Phosphatase 36 (L) 38 - 126 U/L   Total Bilirubin 0.5 0.3 - 1.2 mg/dL   GFR calc non Af Amer 54 (L) >60 mL/min   GFR calc Af Amer >60 >60 mL/min    Comment: (NOTE) The eGFR has been calculated using the CKD EPI equation. This calculation has not been validated in all clinical situations. eGFR's persistently <60 mL/min signify possible Chronic Kidney Disease.    Anion gap 8 5 - 15    Comment: Performed at [facility], [place] [address] [place], [place] [address]  Lipase, blood     Status: None   Collection Time: 07/01/18  3:10 PM  Result Value Ref Range   Lipase 19 11 - 51 U/L    Comment: Performed at [facility], [place] [address], [place], [place] [address]  CBC with Differential     Status: Abnormal   Collection Time: 07/01/18  3:10 PM   Result Value Ref Range   WBC 6.9 4.0 - 10.5 K/uL   RBC 3.53 (L) 4.22 - 5.81 MIL/uL   Hemoglobin 11.0 (L) 13.0 - 17.0 g/dL   HCT 34.1 (L) 39.0 - 52.0 %   MCV 96.6 78.0 - 100.0 fL   MCH 31.2 26.0 - 34.0 pg   MCHC 32.3 30.0 - 36.0 g/dL   RDW 14.5 11.5 - 15.5 %   Platelets 210 150 - 400 K/uL   Neutrophils Relative % 83 %   Neutro Abs 5.8 1.7 - 7.7 K/uL   Lymphocytes Relative 8 %   Lymphs Abs 0.5 (L) 0.7 - 4.0 K/uL   Monocytes Relative 8 %   Monocytes Absolute 0.6 0.1 - 1.0 K/uL   Eosinophils Relative 1 %   Eosinophils Absolute 0.1 0.0 - 0.7 K/uL   Basophils Relative 0 %   Basophils Absolute 0.0 0.0 - 0.1 K/uL    Comment: Performed at [facility], [place] [address] [place], [place] [address]  Protime-INR     Status: None   Collection Time: 07/01/18  3:10 PM  Result Value Ref Range   Prothrombin Time 13.8 11.4 - 15.2 seconds   INR 1.07     Comment: Performed at [facility], [place] [address] [place], [place] [address]  Type and screen [place]     Status: None   Collection Time: 07/01/18  3:10 PM  Result Value Ref Range   ABO/RH(D) O POS    Antibody Screen NEG    Sample Expiration      07/04/2018 Performed at [facility], [place] [address], [place], [place] [address]   POC occult blood, ED Provider will collect     Status: Abnormal   Collection Time: 07/01/18  3:28 PM  Result Value Ref Range   Fecal Occult Bld POSITIVE (A) NEGATIVE  I-Stat CG4 Lactic Acid, ED     Status: None   Collection Time: 07/01/18  3:29 PM  Result Value Ref Range   Lactic Acid, Venous 1.56 0.5 - 1.9 mmol/L   Ct Abdomen Pelvis W Contrast  Result Date: 07/01/2018 CLINICAL DATA:  Abdominal pain suspected diverticulitis, colon cancer, started passing blood in loose stools last night, history coronary artery disease post MI and CABG, cardiomyopathy, chronic systolic CHF, hypertension, atrial fibrillation, former smoker EXAM: CT  ABDOMEN AND PELVIS WITH CONTRAST TECHNIQUE: Multidetector CT imaging of the abdomen and pelvis was performed using the standard protocol following bolus administration  of intravenous contrast. Sagittal and coronal MPR images reconstructed from axial data set. CONTRAST:  158m ISOVUE-300 IOPAMIDOL (ISOVUE-300) INJECTION 61% IV. No oral contrast. COMPARISON:  02/22/2008 FINDINGS: Lower chest: Bibasilar interstitial infiltrates which could represent infection or chronic interstitial lung disease, progressive since 2009. Hepatobiliary: Gallbladder surgically absent. Significant biliary dilatation with CBD 15 mm diameter tapering to 11 mm distally. Mild intrahepatic biliary dilatation. No focal hepatic lesions. Pancreas: Atrophic pancreas. Spleen: Normal appearance.  Surgical clips at hilum. Adrenals/Urinary Tract: Exophytic intermediate attenuation nodule posterior LEFT kidney 13 x 11 mm image 27 previously 9 x 7 mm, demonstrating washout on delayed images question small renal neoplasm. Additional BILATERAL renal cysts. No hydronephrosis or hydroureter. Unremarkable adrenal glands, ureters, and bladder Stomach/Bowel: Prior ileocolic resection with anastomosis in the RIGHT mid abdomen. Few dilated loops of small bowel are seen within the pelvis with a questionable transition zone to normal caliber small bowel in the RIGHT mid pelvis image 51. Rectum and stomach incompletely distended with suboptimal assessment of wall thickness. Minimal sigmoid diverticulosis. Remaining bowel loops unremarkable. Vascular/Lymphatic: Extensive atherosclerotic calcifications aorta, iliac arteries, visceral artery origins, and coronary arteries. Enlargement of cardiac chambers with pacemaker leads RIGHT atrium and RIGHT ventricle. No adenopathy. Reproductive: Mild prostatic enlargement. Other: No free air or free fluid. No definite hernia or acute inflammatory process. Musculoskeletal: Osseous demineralization with scoliosis and degenerative  changes of the lumbar spine. IMPRESSION: Increased biliary dilatation since 2009, CBD 15 mm diameter, recommend correlation with LFTs. Minimal sigmoid diverticulosis. Unable to exclude gastric and rectal wall thickening though these could be areas of artifact from underdistention. Extensive atherosclerotic calcifications aorta, branch vessels, and coronary arteries. 13 x 11 mm enhancing nodule at the posterior margin of the LEFT kidney concerning for a small renal neoplasm. Electronically Signed   By: MLavonia DanaM.D.   On: 07/01/2018 17:06    Pending Labs Unresulted Labs (From admission, onward)   Start     Ordered   07/01/18 1510  Urinalysis, Routine w reflex microscopic  STAT,   STAT     07/01/18 1510   Signed and Held  Type and screen [place]   R    Comments:  [place]   Signed and Held   Signed and Held  CBC  Tomorrow morning,   R     Signed and Held      Vitals/Pain Today's Vitals   07/01/18 1652 07/01/18 1737 07/01/18 1800 07/01/18 1830  BP: (!) 99/52 (!) 104/59 106/69 116/64  Pulse: 69 (!) 56 66 (!) 50  Resp: _0 Temp:      TempSrc:      SpO2: 98% 100% 100% 99%  Weight:      Height:      PainSc:        Isolation Precautions No active isolations  Medications Medications  pantoprazole (PROTONIX) 80 mg in sodium chloride 0.9 % 250 mL (0.32 mg/mL) infusion (8 mg/hr Intravenous New Bag/Given 07/01/18 1606)  pantoprazole (PROTONIX) injection 40 mg (has no administration in time range)  iopamidol (ISOVUE-300) 61 % injection (has no administration in time range)  sodium chloride 0.9 % bolus 500 mL (0 mLs Intravenous Stopped 07/01/18 1651)  pantoprazole (PROTONIX) 80 mg in sodium chloride 0.9 % 100 mL IVPB (0 mg Intravenous Stopped 07/01/18 1836)  iopamidol (ISOVUE-300) 61 % injection 100 mL (100 mLs Intravenous Contrast Given 07/01/18 1638)    Mobility walks with person assist

## 2018-07-01 NOTE — ED Notes (Signed)
Report given to Rachel, RN.

## 2018-07-01 NOTE — H&P (Signed)
History and Physical    [name] [name] CHY:[id number] DOB: Dec 14, 1931 DOA: 07/01/2018  PCP: Crist Infante, MD  Patient coming from: home  Chief Complaint: Melanotic stools  HPI: [name] [name] is a 84 y.o. male with medical history significant of past medical history of coronary artery disease status post CABG in 8786, systolic heart failure with an EF of again February 2017 him a history of rectal cancer in 1989, with re-intervention in 1999 during this time he received radiation and chemotherapy, chronic atrial fibrillation not a candidate for anticoagulation due to GI bleed who comes in for melanotic stool that started the night prior to admission black stools last one turning bright red see he denies any abdominal pain, fever, chills, nausea or vomiting.  ED Course:  He was found to have stable vitals with a hemoglobin of 11 previous hemoglobin 2 years ago was 13 Scan of the abdomen pelvis showed no acute findings. Review of Systems: As per HPI otherwise 10 point review of systems negative.    Past Medical History:  Diagnosis Date  . CAD (coronary artery disease)    Old inferolateral MI s/p CABG in 2002  . CAP (community acquired pneumonia) 10/03/2011  . Cardiomyopathy    s/p ICD 09/30/08; EF 35 to 40% per echo in October 2021  . Chronic systolic CHF (congestive heart failure) ([place]) 07/24/2013  . Colon cancer ([place])    s/p chemo, RT, sugery  . Colorectal anastomotic stricture   . Diverticulosis   . ED (erectile dysfunction)   . GERD (gastroesophageal reflux disease)    rarely  . HTN (hypertension)   . ICD (implantable cardiac defibrillator) discharge October 2012   ICD shock for afib with RVR  . Iron deficiency anemia   . Paroxysmal atrial fibrillation (HCC)    controlled with amiodarone, not a coumadin candidate due to GI bleeding    Past Surgical History:  Procedure Laterality Date  . APPENDECTOMY    . BACK SURGERY    . CARDIAC CATHETERIZATION  11/07/2000   EF 32%  .  CARDIAC DEFIBRILLATOR PLACEMENT  09/30/08   MDT dual chamber ICD placed by Dr Rayann Heman  . CARDIOVASCULAR STRESS TEST  08/06/2008   EF 30%  . CHOLECYSTECTOMY    . COLON RESECTION     x2  . CORONARY ARTERY BYPASS GRAFT    . EP IMPLANTABLE DEVICE N/A 10/15/2015   Procedure:  PPM Generator Changeout;  Surgeon: [name], MD;  Location: [facility];  Service: Cardiovascular;  Laterality: N/A;  . HERNIA REPAIR    . US ECHOCARDIOGRAPHY  08/11/2008   EF 30-35%  . VASECTOMY       reports that he has quit smoking. His smoking use included cigars. He has never used smokeless tobacco. He reports that he does not drink alcohol or use drugs.  Allergies  Allergen Reactions  . Aspirin     Bleeding   . Iron     Stomach issues  . Lescol [Fluvastatin Sodium]     Dizziness  . Pentazocine Lactate     unknown  . Vytorin [Ezetimibe-Simvastatin]     Leg cramps     Family History  Problem Relation Age of Onset  . Colon cancer Father   . Diabetes Sister   . Colon polyps Brother   . Pancreatic cancer Brother 34     Prior to Admission medications   Medication Sig Start Date End Date Taking? Authorizing Provider  cholecalciferol (VITAMIN D) 1000 UNITS tablet Take  1,000 Units by mouth daily.    Yes [provider]  digoxin (LANOXIN) 0.125 MG tablet TAKE 1/2 TABLET EVERY DAY 03/06/18  Yes [name], Peter M, MD  diphenoxylate-atropine (LOMOTIL) 2.5-0.025 MG per tablet Take 2 tablets by mouth 2 (two) times daily as needed for diarrhea or loose stools. For diarrhea    Yes [provider]  furosemide (LASIX) 40 MG tablet Take 1 tablet (40 mg total) by mouth daily. Take 40 mg alternating with 20 mg daily 01/18/16  Yes [name], Peter M, MD  metoprolol (LOPRESSOR) 50 MG tablet Take 0.5 tablets (25 mg total) by mouth daily. 03/24/16  Yes [name], Peter M, MD  metoprolol succinate (TOPROL-XL) 100 MG 24 hr tablet TAKE 1 TABLET EVERY DAY 09/01/17  Yes [name], Peter M, MD  Multiple  Vitamins-Minerals (MULTIVITAMINS THER. W/MINERALS) TABS Take 1 tablet by mouth daily.     Yes [provider]  nitroGLYCERIN (NITROSTAT) 0.4 MG SL tablet Place 1 tablet (0.4 mg total) under the tongue every 5 (five) minutes as needed for chest pain. 05/07/14  Yes [name], Peter M, MD  PROAIR HFA 108 [phone] BASE) MCG/ACT inhaler Inhale 2 puffs into the lungs every 4 (four) hours as needed for shortness of breath.  10/22/13  Yes [provider]  ramipril (ALTACE) 10 MG capsule TAKE 1 CAPSULE EVERY DAY 04/17/18  Yes [name], Peter M, MD  spironolactone (ALDACTONE) 25 MG tablet Take 1 tablet (25 mg total) by mouth daily. 05/01/18  Yes [name], Peter M, MD  temazepam (RESTORIL) 7.5 MG capsule Take 1 capsule (7.5 mg total) by mouth at bedtime as needed for sleep. 10/17/13  Yes Barton Dubois, MD  vitamin B-12 (CYANOCOBALAMIN) 1000 MCG tablet Take 1,000 mcg by mouth daily.   Yes [provider]    Physical Exam: Vitals:   07/01/18 1606 07/01/18 1615 07/01/18 1652 07/01/18 1737  BP:  (!) 89/64 (!) 99/52 (!) 104/59  Pulse: (!) 59 70 69 (!) 56  Resp: 16 19 17 16   Temp:      TempSrc:      SpO2: 100% 100% 98% 100%  Weight:      Height:        Constitutional: NAD, calm, comfortable Vitals:   07/01/18 1606 07/01/18 1615 07/01/18 1652 07/01/18 1737  BP:  (!) 89/64 (!) 99/52 (!) 104/59  Pulse: (!) 59 70 69 (!) 56  Resp: 16 19 17 16   Temp:      TempSrc:      SpO2: 100% 100% 98% 100%  Weight:      Height:       Eyes: PERRL, lids and conjunctivae normal ENMT: Mucous membranes are moist. Posterior pharynx clear of any exudate or lesions.Normal dentition.  Neck: normal, supple, no masses, no thyromegaly Respiratory: clear to auscultation bilaterally, no wheezing, no crackles. Normal respiratory effort. No accessory muscle use.  Cardiovascular: Regular rate and rhythm, no murmurs / rubs / gallops. No extremity edema. 2+ pedal pulses. No carotid bruits.  Abdomen: no tenderness, no  masses palpated. No hepatosplenomegaly. Bowel sounds positive.  Musculoskeletal: no clubbing / cyanosis. No joint deformity upper and lower extremities. Good ROM, no contractures. Normal muscle tone.  Skin: no rashes, lesions, ulcers. No induration Neurologic: CN 2-12 grossly intact. Sensation intact, DTR normal. Strength 5/5 in all 4.  Psychiatric: Normal judgment and insight. Alert and oriented x 3. Normal mood.    Labs on Admission: I have personally reviewed following labs and imaging studies  CBC: Recent Labs  Lab 07/01/18 1510  WBC 6.9  NEUTROABS 5.8  HGB 11.0*  HCT 34.1*  MCV 96.6  PLT 244   Basic Metabolic Panel: Recent Labs  Lab 07/01/18 1510  NA 142  K 4.2  CL 107  CO2 27  GLUCOSE 120*  BUN 27*  CREATININE 1.19  CALCIUM 9.1   GFR: Estimated Creatinine Clearance: 44.2 mL/min (by C-G formula based on SCr of 1.19 mg/dL). Liver Function Tests: Recent Labs  Lab 07/01/18 1510  AST 30  ALT 19  ALKPHOS 36*  BILITOT 0.5  PROT 5.9*  ALBUMIN 3.4*   Recent Labs  Lab 07/01/18 1510  LIPASE 19   No results for input(s): AMMONIA in the last 168 hours. Coagulation Profile: Recent Labs  Lab 07/01/18 1510  INR 1.07   Cardiac Enzymes: No results for input(s): CKTOTAL, CKMB, CKMBINDEX, TROPONINI in the last 168 hours. BNP (last 3 results) No results for input(s): PROBNP in the last 8760 hours. HbA1C: No results for input(s): HGBA1C in the last 72 hours. CBG: No results for input(s): GLUCAP in the last 168 hours. Lipid Profile: No results for input(s): CHOL, HDL, LDLCALC, TRIG, CHOLHDL, LDLDIRECT in the last 72 hours. Thyroid Function Tests: No results for input(s): TSH, T4TOTAL, FREET4, T3FREE, THYROIDAB in the last 72 hours. Anemia Panel: No results for input(s): VITAMINB12, FOLATE, FERRITIN, TIBC, IRON, RETICCTPCT in the last 72 hours. Urine analysis:    Component Value Date/Time   COLORURINE YELLOW 10/14/2013 [place] 10/14/2013  1510   LABSPEC 1.017 10/14/2013 1510   PHURINE 6.0 10/14/2013 1510   GLUCOSEU NEGATIVE 10/14/2013 1510   HGBUR NEGATIVE 10/14/2013 1510   BILIRUBINUR NEGATIVE 10/14/2013 1510   KETONESUR NEGATIVE 10/14/2013 1510   PROTEINUR 30 (A) 10/14/2013 1510   UROBILINOGEN 1.0 10/14/2013 1510   NITRITE NEGATIVE 10/14/2013 1510   LEUKOCYTESUR NEGATIVE 10/14/2013 1510    Radiological Exams on Admission: Ct Abdomen Pelvis W Contrast  Result Date: 07/01/2018 CLINICAL DATA:  Abdominal pain suspected diverticulitis, colon cancer, started passing blood in loose stools last night, history coronary artery disease post MI and CABG, cardiomyopathy, chronic systolic CHF, hypertension, atrial fibrillation, former smoker EXAM: CT ABDOMEN AND PELVIS WITH CONTRAST TECHNIQUE: Multidetector CT imaging of the abdomen and pelvis was performed using the standard protocol following bolus administration of intravenous contrast. Sagittal and coronal MPR images reconstructed from axial data set. CONTRAST:  157mL ISOVUE-300 IOPAMIDOL (ISOVUE-300) INJECTION 61% IV. No oral contrast. COMPARISON:  02/22/2008 FINDINGS: Lower chest: Bibasilar interstitial infiltrates which could represent infection or chronic interstitial lung disease, progressive since 2009. Hepatobiliary: Gallbladder surgically absent. Significant biliary dilatation with CBD 15 mm diameter tapering to 11 mm distally. Mild intrahepatic biliary dilatation. No focal hepatic lesions. Pancreas: Atrophic pancreas. Spleen: Normal appearance.  Surgical clips at hilum. Adrenals/Urinary Tract: Exophytic intermediate attenuation nodule posterior LEFT kidney 13 x 11 mm image 27 previously 9 x 7 mm, demonstrating washout on delayed images question small renal neoplasm. Additional BILATERAL renal cysts. No hydronephrosis or hydroureter. Unremarkable adrenal glands, ureters, and bladder Stomach/Bowel: Prior ileocolic resection with anastomosis in the RIGHT mid abdomen. Few dilated loops of  small bowel are seen within the pelvis with a questionable transition zone to normal caliber small bowel in the RIGHT mid pelvis image 51. Rectum and stomach incompletely distended with suboptimal assessment of wall thickness. Minimal sigmoid diverticulosis. Remaining bowel loops unremarkable. Vascular/Lymphatic: Extensive atherosclerotic calcifications aorta, iliac arteries, visceral artery origins, and coronary arteries. Enlargement of cardiac chambers with pacemaker leads RIGHT atrium  and RIGHT ventricle. No adenopathy. Reproductive: Mild prostatic enlargement. Other: No free air or free fluid. No definite hernia or acute inflammatory process. Musculoskeletal: Osseous demineralization with scoliosis and degenerative changes of the lumbar spine. IMPRESSION: Increased biliary dilatation since 2009, CBD 15 mm diameter, recommend correlation with LFTs. Minimal sigmoid diverticulosis. Unable to exclude gastric and rectal wall thickening though these could be areas of artifact from underdistention. Extensive atherosclerotic calcifications aorta, branch vessels, and coronary arteries. 13 x 11 mm enhancing nodule at the posterior margin of the LEFT kidney concerning for a small renal neoplasm. Electronically Signed   By: Lavonia Dana M.D.   On: 07/01/2018 17:06    EKG: Independently reviewed.  None  Assessment/Plan Lower GI bleed: Your source, he is 84 years old his hemoglobin dropped from 13-11 he is currently asymptomatic. We will type and screen and 2 units of packed red blood cells CT scan showed no signs of masses. He denies any pain. The GI already spoke with Dr. Pamalee Leyden GI will come and see the patient in the morning.  We will repeat a CBC in the morning.  Will be careful with hydration as he has a low EF  Atrial fibrillation, chronic (HCC) On no ANTI coagulation we will continue beta-blockers. He seems to be rate controlled    Ischemic cardiomyopathy/Chronic systolic CHF (congestive heart  failure) (HCC) Him n.p.o., will hold his diuretics and ACE inhibitor as his blood pressure is borderline continue beta-blockers.  Essential hypertension Blood pressure is borderline low.    DVT prophylaxis: SCD Code Status: full Family Communication: Wife and daughter  Disposition Plan: none Consults called: Gi Dr. Michail Sermon Admission status: inpatient   Charlynne Cousins MD [facility] Pager [phone]  If 7PM-7AM, please contact night-coverage [email or web address] Password [email or web address]  07/01/2018, 6:02 PM

## 2018-07-01 NOTE — ED Triage Notes (Signed)
Pt has history of colon cancer, started passing blood with stools last night. Stools are loose

## 2018-07-02 DIAGNOSIS — I959 Hypotension, unspecified: Secondary | ICD-10-CM | POA: Diagnosis not present

## 2018-07-02 DIAGNOSIS — K625 Hemorrhage of anus and rectum: Secondary | ICD-10-CM

## 2018-07-02 DIAGNOSIS — D62 Acute posthemorrhagic anemia: Secondary | ICD-10-CM

## 2018-07-02 DIAGNOSIS — I482 Chronic atrial fibrillation: Secondary | ICD-10-CM | POA: Diagnosis not present

## 2018-07-02 DIAGNOSIS — I5022 Chronic systolic (congestive) heart failure: Secondary | ICD-10-CM | POA: Diagnosis not present

## 2018-07-02 DIAGNOSIS — I255 Ischemic cardiomyopathy: Secondary | ICD-10-CM | POA: Diagnosis not present

## 2018-07-02 DIAGNOSIS — I10 Essential (primary) hypertension: Secondary | ICD-10-CM | POA: Diagnosis not present

## 2018-07-02 DIAGNOSIS — K922 Gastrointestinal hemorrhage, unspecified: Secondary | ICD-10-CM | POA: Diagnosis not present

## 2018-07-02 LAB — CBC
HEMATOCRIT: 32.2 % — AB (ref 39.0–52.0)
Hemoglobin: 10.4 g/dL — ABNORMAL LOW (ref 13.0–17.0)
MCH: 31.2 pg (ref 26.0–34.0)
MCHC: 32.3 g/dL (ref 30.0–36.0)
MCV: 96.7 fL (ref 78.0–100.0)
PLATELETS: 175 10*3/uL (ref 150–400)
RBC: 3.33 MIL/uL — ABNORMAL LOW (ref 4.22–5.81)
RDW: 14.5 % (ref 11.5–15.5)
WBC: 5.4 10*3/uL (ref 4.0–10.5)

## 2018-07-02 LAB — HEMOGLOBIN: HEMOGLOBIN: 10.8 g/dL — AB (ref 13.0–17.0)

## 2018-07-02 NOTE — Progress Notes (Signed)
Patient's watch, wedding ring and billfold given to patient's wife.

## 2018-07-02 NOTE — Consult Note (Addendum)
Consultation  Referring Provider: Dr. Aileen Fass Primary Care Physician:  Crist Infante, MD Primary Gastroenterologist: Dr. Carlean Purl        Reason for Consultation:   GI bleed, history of colon cancer         HPI:   [name] [name] is a 84 y.o. male with a past medical history of colon cancer x2, last in 2000 and the first years before, last colonoscopy 2009 with no polyps though did have an anastomotic stricture, CAD status post CABG in 2012, cardiomyopathy, chronic systolic CHF and A. fib not on anticoagulation due to history of GI bleed and multiple others listed below, who presented to the ER on 07/01/2018 with bloody stool that started 2 days previously.    Today, found surrounded by son and wife who assist with history. Explains that he was doing great, had his annual physical last week with no problems, but 2 nights ago he passed a stool with some bright red blood. This occurred again two more times, the last two without a stool, last around 11am yesterday and he proceeded to the ER. Has passed no further bloody stools and has no complaints.    Does describe chronic loose stools ever since colon surgery years ago, most recently 4-5 mos ago started having problems with fecal incontinence and was having some accidents. His wife reports, "he didn't know when he had to go". PCP started Lomotil and this has helped some.     Denies fever, chills, weight loss, abdominal or rectal pain.  ED Course:  He was found to have stable vitals with a hemoglobin of 11 previous hemoglobin 2 years ago was 13; CT scan of the abdomen pelvis showed no acute findings.  Previous GI history: Office visit Dr. Carlean Purl: Discussed surveillance colonoscopy due to history of colon cancer last in 2000, another years before that; at that time he decided to observe and forego routine colonoscopy.  Past Medical History:  Diagnosis Date  . CAD (coronary artery disease)    Old inferolateral MI s/p CABG in 2002  . CAP  (community acquired pneumonia) 10/03/2011  . Cardiomyopathy    s/p ICD 09/30/08; EF 35 to 40% per echo in October 2021  . Chronic systolic CHF (congestive heart failure) ([place]) 07/24/2013  . Colon cancer ([place])    s/p chemo, RT, sugery  . Colorectal anastomotic stricture   . Diverticulosis   . ED (erectile dysfunction)   . GERD (gastroesophageal reflux disease)    rarely  . HTN (hypertension)   . ICD (implantable cardiac defibrillator) discharge October 2012   ICD shock for afib with RVR  . Iron deficiency anemia   . Paroxysmal atrial fibrillation ([facility])    controlled with amiodarone, not a coumadin candidate due to GI bleeding    Past Surgical History:  Procedure Laterality Date  . APPENDECTOMY    . BACK SURGERY    . CARDIAC CATHETERIZATION  11/07/2000   EF 32%  . CARDIAC DEFIBRILLATOR PLACEMENT  09/30/08   MDT dual chamber ICD placed by Dr Rayann Heman  . CARDIOVASCULAR STRESS TEST  08/06/2008   EF 30%  . CHOLECYSTECTOMY    . COLON RESECTION     x2  . CORONARY ARTERY BYPASS GRAFT    . EP IMPLANTABLE DEVICE N/A 10/15/2015   Procedure:  PPM Generator Changeout;  Surgeon: [name], MD;  Location: [facility];  Service: Cardiovascular;  Laterality: N/A;  . HERNIA REPAIR    . US ECHOCARDIOGRAPHY  08/11/2008   EF 30-35%  . VASECTOMY      Family History  Problem Relation Age of Onset  . Colon cancer Father   . Diabetes Sister   . Colon polyps Brother   . Pancreatic cancer Brother 53     Social History   Tobacco Use  . Smoking status: Former Smoker    Types: Cigars  . Smokeless tobacco: Never Used  Substance Use Topics  . Alcohol use: No  . Drug use: No    Prior to Admission medications   Medication Sig Start Date End Date Taking? Authorizing Provider  cholecalciferol (VITAMIN D) 1000 UNITS tablet Take 1,000 Units by mouth daily.    Yes [provider]  digoxin (LANOXIN) 0.125 MG tablet TAKE 1/2 TABLET EVERY DAY 03/06/18  Yes [name], Peter M, MD    diphenoxylate-atropine (LOMOTIL) 2.5-0.025 MG per tablet Take 2 tablets by mouth 2 (two) times daily as needed for diarrhea or loose stools. For diarrhea    Yes [provider]  furosemide (LASIX) 40 MG tablet Take 1 tablet (40 mg total) by mouth daily. Take 40 mg alternating with 20 mg daily 01/18/16  Yes [name], Peter M, MD  metoprolol (LOPRESSOR) 50 MG tablet Take 0.5 tablets (25 mg total) by mouth daily. Patient taking differently: Take 12.5 mg by mouth daily.  03/24/16  Yes [name], Peter M, MD  metoprolol succinate (TOPROL-XL) 100 MG 24 hr tablet TAKE 1 TABLET EVERY DAY 09/01/17  Yes [name], Peter M, MD  Multiple Vitamins-Minerals (MULTIVITAMINS THER. W/MINERALS) TABS Take 1 tablet by mouth daily.     Yes [provider]  nitroGLYCERIN (NITROSTAT) 0.4 MG SL tablet Place 1 tablet (0.4 mg total) under the tongue every 5 (five) minutes as needed for chest pain. 05/07/14  Yes [name], Peter M, MD  PROAIR HFA [phone] BASE) MCG/ACT inhaler Inhale 2 puffs into the lungs every 4 (four) hours as needed for shortness of breath.  10/22/13  Yes [provider]  ramipril (ALTACE) 10 MG capsule TAKE 1 CAPSULE EVERY DAY 04/17/18  Yes [name], Peter M, MD  spironolactone (ALDACTONE) 25 MG tablet Take 1 tablet (25 mg total) by mouth daily. 05/01/18  Yes [name], Peter M, MD  temazepam (RESTORIL) 7.5 MG capsule Take 1 capsule (7.5 mg total) by mouth at bedtime as needed for sleep. 10/17/13  Yes Barton Dubois, MD  vitamin B-12 (CYANOCOBALAMIN) 1000 MCG tablet Take 1,000 mcg by mouth daily.   Yes [provider]    Current Facility-Administered Medications  Medication Dose Route Frequency Provider Last Rate Last Dose  . 0.9 %  sodium chloride infusion  250 mL Intravenous PRN Charlynne Cousins, MD      . acetaminophen (TYLENOL) tablet 650 mg  650 mg Oral Q6H PRN Charlynne Cousins, MD       Or  . acetaminophen (TYLENOL) suppository 650 mg  650 mg Rectal Q6H PRN Charlynne Cousins, MD      . albuterol (PROVENTIL) (2.5 MG/3ML) 0.083% nebulizer solution 2.5 mg  2.5 mg Nebulization Q4H PRN Charlynne Cousins, MD      . cholecalciferol (VITAMIN D) tablet 1,000 Units  1,000 Units Oral Daily Charlynne Cousins, MD      . digoxin [facility]) tablet 62.5 mcg  62.5 mcg Oral Daily Charlynne Cousins, MD      . metoprolol succinate (TOPROL-XL) 24 hr tablet 100 mg  100 mg Oral Daily Charlynne Cousins, MD      .  nitroGLYCERIN (NITROSTAT) SL tablet 0.4 mg  0.4 mg Sublingual Q5 min PRN Charlynne Cousins, MD      . sodium chloride flush (NS) 0.9 % injection 3 mL  3 mL Intravenous Q12H Charlynne Cousins, MD   3 mL at 07/01/18 2030  . sodium chloride flush (NS) 0.9 % injection 3 mL  3 mL Intravenous PRN Charlynne Cousins, MD      . temazepam (RESTORIL) capsule 7.5 mg  7.5 mg Oral QHS PRN Charlynne Cousins, MD        Allergies as of 07/01/2018 - Review Complete 07/01/2018  Allergen Reaction Noted  . Aspirin  10/14/2011  . Iron  03/04/2013  . Lescol [fluvastatin sodium]  03/04/2013  . Pentazocine lactate  06/30/2011  . Vytorin [ezetimibe-simvastatin]  03/04/2013     Review of Systems:    Constitutional: No weight loss, fever or chills Skin: No rash Cardiovascular: No chest pain Respiratory: No SOB  Gastrointestinal: See HPI and otherwise negative Genitourinary: No dysuria  Neurological: No headache, dizziness or syncope Musculoskeletal: No new muscle or joint pain Hematologic: No bruising Psychiatric: No history of depression or anxiety    Physical Exam:  Vital signs in last 24 hours: Temp:  [97.6 F (36.4 C)-98.8 F (37.1 C)] 97.6 F (36.4 C) (08/05 0428) Pulse Rate:  [50-84] 51 (08/05 0428) Resp:  [13-20] 18 (08/05 0428) BP: (84-116)/(48-73) 104/72 (08/05 0428) SpO2:  [98 %-100 %] 100 % (08/05 0428) Weight:  [143 lb 1.3 oz (64.9 kg)-149 lb (67.6 kg)] 143 lb 1.3 oz (64.9 kg) (08/04 1859) Last BM Date: 07/01/18 General:   Pleasant elderly  Caucasian male appears to be in NAD, Well developed, Well nourished, alert and cooperative Head:  Normocephalic and atraumatic. Eyes:   PEERL, EOMI. No icterus. Conjunctiva pink. Ears:  Normal auditory acuity. Neck:  Supple Throat: Oral cavity and pharynx without inflammation, swelling or lesion.  Lungs: Respirations even and unlabored. Lungs clear to auscultation bilaterally.   No wheezes, crackles, or rhonchi.  Heart: Normal S1, S2. No MRG. Regular rate and rhythm. No peripheral edema, cyanosis or pallor.  Abdomen:  Soft, nondistended, nontender. No rebound or guarding. Normal bowel sounds. No appreciable masses or hepatomegaly. Rectal:  Not performed.  Msk:  Symmetrical without gross deformities. Peripheral pulses intact.  Extremities:  Without edema, no deformity or joint abnormality. Normal ROM, normal sensation. Neurologic:  Alert and  oriented x4;  grossly normal neurologically.  Skin:   Dry and intact without significant lesions or rashes. Psychiatric: Demonstrates good judgement and reason without abnormal affect or behaviors.   LAB RESULTS: Recent Labs    07/01/18 1510 07/02/18 0445  WBC 6.9 5.4  HGB 11.0* 10.4*  HCT 34.1* 32.2*  PLT 210 175   BMET Recent Labs    07/01/18 1510  NA 142  K 4.2  CL 107  CO2 27  GLUCOSE 120*  BUN 27*  CREATININE 1.19  CALCIUM 9.1   LFT Recent Labs    07/01/18 1510  PROT 5.9*  ALBUMIN 3.4*  AST 30  ALT 19  ALKPHOS 36*  BILITOT 0.5   PT/INR Recent Labs    07/01/18 1510  LABPROT 13.8  INR 1.07    STUDIES: Ct Abdomen Pelvis W Contrast  Result Date: 07/01/2018 CLINICAL DATA:  Abdominal pain suspected diverticulitis, colon cancer, started passing blood in loose stools last night, history coronary artery disease post MI and CABG, cardiomyopathy, chronic systolic CHF, hypertension, atrial fibrillation, former smoker EXAM: CT ABDOMEN AND  PELVIS WITH CONTRAST TECHNIQUE: Multidetector CT imaging of the abdomen and pelvis was  performed using the standard protocol following bolus administration of intravenous contrast. Sagittal and coronal MPR images reconstructed from axial data set. CONTRAST:  187mL ISOVUE-300 IOPAMIDOL (ISOVUE-300) INJECTION 61% IV. No oral contrast. COMPARISON:  02/22/2008 FINDINGS: Lower chest: Bibasilar interstitial infiltrates which could represent infection or chronic interstitial lung disease, progressive since 2009. Hepatobiliary: Gallbladder surgically absent. Significant biliary dilatation with CBD 15 mm diameter tapering to 11 mm distally. Mild intrahepatic biliary dilatation. No focal hepatic lesions. Pancreas: Atrophic pancreas. Spleen: Normal appearance.  Surgical clips at hilum. Adrenals/Urinary Tract: Exophytic intermediate attenuation nodule posterior LEFT kidney 13 x 11 mm image 27 previously 9 x 7 mm, demonstrating washout on delayed images question small renal neoplasm. Additional BILATERAL renal cysts. No hydronephrosis or hydroureter. Unremarkable adrenal glands, ureters, and bladder Stomach/Bowel: Prior ileocolic resection with anastomosis in the RIGHT mid abdomen. Few dilated loops of small bowel are seen within the pelvis with a questionable transition zone to normal caliber small bowel in the RIGHT mid pelvis image 51. Rectum and stomach incompletely distended with suboptimal assessment of wall thickness. Minimal sigmoid diverticulosis. Remaining bowel loops unremarkable. Vascular/Lymphatic: Extensive atherosclerotic calcifications aorta, iliac arteries, visceral artery origins, and coronary arteries. Enlargement of cardiac chambers with pacemaker leads RIGHT atrium and RIGHT ventricle. No adenopathy. Reproductive: Mild prostatic enlargement. Other: No free air or free fluid. No definite hernia or acute inflammatory process. Musculoskeletal: Osseous demineralization with scoliosis and degenerative changes of the lumbar spine. IMPRESSION: Increased biliary dilatation since 2009, CBD 15 mm  diameter, recommend correlation with LFTs. Minimal sigmoid diverticulosis. Unable to exclude gastric and rectal wall thickening though these could be areas of artifact from underdistention. Extensive atherosclerotic calcifications aorta, branch vessels, and coronary arteries. 13 x 11 mm enhancing nodule at the posterior margin of the LEFT kidney concerning for a small renal neoplasm. Electronically Signed   By: Lavonia Dana M.D.   On: 07/01/2018 17:06    Impression / Plan:   Impression: 1.  Lower GI bleed: Hemoglobin dropped from 13-11-10.4, CT with no sign of mass, some possible rectal thickening, 3 brb BM started 48 hours ago, last around 11am 07/01/18, hgb did drop some overnight-consider relation to dilution vs bleeding 2.  Acute blood loss anemia 3.  Chronic A. Fib: Not on anticoagulation due to history of GI bleed 4.  Ischemic cardiomyopathy/chronic systolic CHF  Plan: 1. Will plan to observe for now with no signs of acute GI bleeding. 2. Continue to monitor hgb with transfusion as needed <8- ordered repeat hgb now for comparison 3. Will allow patient clear liquid diet today 4. Please await any further recommendations from Dr. Lyndel Safe later today.  Thank you for your kind consultation, we will continue to follow.  Lavone Nian [facility]  07/02/2018, 10:19 AM   Attending physician's note   I have taken an interval history, reviewed the chart and examined the patient. I agree with the Advanced Practitioner's note, impression and recommendations.   84 year old with a history of colon cancer x2, last colonoscopy 2009 was negative for recurrence, admitted with rectal bleeding which has completely resolved.  Patient refuses to have any colonoscopy/flexible sigmoidoscopy.  No further bleeding. CT negative for any recurrence but did show rectal wall thickening.  Plan: Advance diet, recheck CBC.  If no further bleeding, can discharge home.  He does understand that there is a chance of missing colorectal  neoplasms.  Willing to consider colonoscopy if he continues to  have recurrent bleeding.  Certainly not right now.  Carmell Austria, MD

## 2018-07-02 NOTE — Progress Notes (Signed)
[facility] PROGRESS NOTE    Progress Note  [name] [name]  IRC:[id number] DOB: 06/03/1932 DOA: 07/01/2018 PCP: Crist Infante, MD     Brief Narrative:   [name] [name] is an 84 y.o. male past medical history significant for coronary artery disease status post CABG 5102, systolic heart failure with rectal cancer status post 2 interventions, chronic atrial for ablation not a candidate for anti-correlation due to GI bleed who comes in for melanotic stools and bright red blood per rectum.  Assessment/Plan:   Melanotic stools likely due to lower GI bleed: Hemoglobin in January was 13 this morning is 10.4, he is currently asymptomatic, he has been typed and screened. CT scan done on 07/01/2018 show no masses. We will keep him n.p.o. awaiting of our GI recommendations.  Chronic atrial fibrillation: Not on anticoagulation continue beta-blockers rate control.  Chronic systolic heart failure: He seems to be euvolemic we will hold diuretic and ACE inhibitors.  As his blood pressure is borderline.  Essential hypertension: His blood pressure continues to be low.  DVT prophylaxis: scd Family Communication:wife Disposition Plan/Barrier to D/C: home in 1-2 days Code Status:     Code Status Orders  (From admission, onward)        Start     Ordered   07/01/18 1859  Full code  Continuous     07/01/18 1858    Code Status History    Date Active Date Inactive Code Status Order ID Comments User Context   10/15/2015 1521 10/15/2015 2014 Full Code [id number]  [name], MD Inpatient   10/14/2013 1751 10/17/2013 1623 Full Code [id number]  Elmarie Shiley, MD Inpatient   10/02/2011 1826 10/06/2011 1716 Full Code [id number]  Valente David, RN Inpatient        IV Access:    Peripheral IV   Procedures and diagnostic studies:   Ct Abdomen Pelvis W Contrast  Result Date: 07/01/2018 CLINICAL DATA:  Abdominal pain suspected diverticulitis, colon cancer, started passing blood in loose  stools last night, history coronary artery disease post MI and CABG, cardiomyopathy, chronic systolic CHF, hypertension, atrial fibrillation, former smoker EXAM: CT ABDOMEN AND PELVIS WITH CONTRAST TECHNIQUE: Multidetector CT imaging of the abdomen and pelvis was performed using the standard protocol following bolus administration of intravenous contrast. Sagittal and coronal MPR images reconstructed from axial data set. CONTRAST:  156mL ISOVUE-300 IOPAMIDOL (ISOVUE-300) INJECTION 61% IV. No oral contrast. COMPARISON:  02/22/2008 FINDINGS: Lower chest: Bibasilar interstitial infiltrates which could represent infection or chronic interstitial lung disease, progressive since 2009. Hepatobiliary: Gallbladder surgically absent. Significant biliary dilatation with CBD 15 mm diameter tapering to 11 mm distally. Mild intrahepatic biliary dilatation. No focal hepatic lesions. Pancreas: Atrophic pancreas. Spleen: Normal appearance.  Surgical clips at hilum. Adrenals/Urinary Tract: Exophytic intermediate attenuation nodule posterior LEFT kidney 13 x 11 mm image 27 previously 9 x 7 mm, demonstrating washout on delayed images question small renal neoplasm. Additional BILATERAL renal cysts. No hydronephrosis or hydroureter. Unremarkable adrenal glands, ureters, and bladder Stomach/Bowel: Prior ileocolic resection with anastomosis in the RIGHT mid abdomen. Few dilated loops of small bowel are seen within the pelvis with a questionable transition zone to normal caliber small bowel in the RIGHT mid pelvis image 51. Rectum and stomach incompletely distended with suboptimal assessment of wall thickness. Minimal sigmoid diverticulosis. Remaining bowel loops unremarkable. Vascular/Lymphatic: Extensive atherosclerotic calcifications aorta, iliac arteries, visceral artery origins, and coronary arteries. Enlargement of cardiac chambers with pacemaker leads RIGHT atrium and RIGHT ventricle. No adenopathy. Reproductive: Mild  prostatic  enlargement. Other: No free air or free fluid. No definite hernia or acute inflammatory process. Musculoskeletal: Osseous demineralization with scoliosis and degenerative changes of the lumbar spine. IMPRESSION: Increased biliary dilatation since 2009, CBD 15 mm diameter, recommend correlation with LFTs. Minimal sigmoid diverticulosis. Unable to exclude gastric and rectal wall thickening though these could be areas of artifact from underdistention. Extensive atherosclerotic calcifications aorta, branch vessels, and coronary arteries. 13 x 11 mm enhancing nodule at the posterior margin of the LEFT kidney concerning for a small renal neoplasm. Electronically Signed   By: Lavonia Dana M.D.   On: 07/01/2018 17:06     Medical Consultants:    None.  Anti-Infectives:   None  Subjective:    [name] [name] he relates no further bowel movements or bloody bowel movements since 11 PM yesterday.  Objective:    Vitals:   07/01/18 1830 07/01/18 1859 07/01/18 2017 07/02/18 0428  BP: 116/64 109/64 (!) 108/55 104/72  Pulse: (!) 50 77 (!) 53 (!) 51  Resp: 13 20 20 18   Temp:  98.3 F (36.8 C) 98.1 F (36.7 C) 97.6 F (36.4 C)  TempSrc:  Oral Oral   SpO2: 99% 100% 100% 100%  Weight:  64.9 kg (143 lb 1.3 oz)    Height:  5\' 11"  (1.803 m)      Intake/Output Summary (Last 24 hours) at 07/02/2018 0823 Last data filed at 07/01/2018 1836 Gross per 24 hour  Intake 600 ml  Output -  Net 600 ml   Filed Weights   07/01/18 1451 07/01/18 1859  Weight: 67.6 kg (149 lb) 64.9 kg (143 lb 1.3 oz)    Exam: General exam: In no acute distress. Respiratory system: Good air movement and clear to auscultation. Cardiovascular system: S1 & S2 heard, RRR.  Gastrointestinal system: Abdomen is nondistended, soft and nontender.  Central nervous system: Alert and oriented. No focal neurological deficits. Extremities: No pedal edema. Skin: No rashes, lesions or ulcers Psychiatry: Judgement and insight appear normal.  Mood & affect appropriate.    Data Reviewed:    Labs: Basic Metabolic Panel: Recent Labs  Lab 07/01/18 1510  NA 142  K 4.2  CL 107  CO2 27  GLUCOSE 120*  BUN 27*  CREATININE 1.19  CALCIUM 9.1   GFR Estimated Creatinine Clearance: 42.4 mL/min (by C-G formula based on SCr of 1.19 mg/dL). Liver Function Tests: Recent Labs  Lab 07/01/18 1510  AST 30  ALT 19  ALKPHOS 36*  BILITOT 0.5  PROT 5.9*  ALBUMIN 3.4*   Recent Labs  Lab 07/01/18 1510  LIPASE 19   No results for input(s): AMMONIA in the last 168 hours. Coagulation profile Recent Labs  Lab 07/01/18 1510  INR 1.07    CBC: Recent Labs  Lab 07/01/18 1510 07/02/18 0445  WBC 6.9 5.4  NEUTROABS 5.8  --   HGB 11.0* 10.4*  HCT 34.1* 32.2*  MCV 96.6 96.7  PLT 210 175   Cardiac Enzymes: No results for input(s): CKTOTAL, CKMB, CKMBINDEX, TROPONINI in the last 168 hours. BNP (last 3 results) No results for input(s): PROBNP in the last 8760 hours. CBG: No results for input(s): GLUCAP in the last 168 hours. D-Dimer: No results for input(s): DDIMER in the last 72 hours. Hgb A1c: No results for input(s): HGBA1C in the last 72 hours. Lipid Profile: No results for input(s): CHOL, HDL, LDLCALC, TRIG, CHOLHDL, LDLDIRECT in the last 72 hours. Thyroid function studies: No results for input(s): TSH, T4TOTAL, T3FREE, THYROIDAB  in the last 72 hours.  Invalid input(s): FREET3 Anemia work up: No results for input(s): VITAMINB12, FOLATE, FERRITIN, TIBC, IRON, RETICCTPCT in the last 72 hours. Sepsis Labs: Recent Labs  Lab 07/01/18 1510 07/01/18 1529 07/02/18 0445  WBC 6.9  --  5.4  LATICACIDVEN  --  1.56  --    Microbiology No results found for this or any previous visit (from the past 240 hour(s)).   Medications:   . cholecalciferol  1,000 Units Oral Daily  . digoxin  62.5 mcg Oral Daily  . metoprolol succinate  100 mg Oral Daily  . sodium chloride flush  3 mL Intravenous Q12H   Continuous  Infusions: . sodium chloride        LOS: 1 day   Charlynne Cousins  [facility] Pager [phone]  *Please refer to [email or web address], password [email or web address] to get updated schedule on who will round on this patient, as hospitalists switch teams weekly. If 7PM-7AM, please contact night-coverage at [email or web address], password [email or web address] for any overnight needs.  07/02/2018, 8:23 AM

## 2018-07-03 ENCOUNTER — Telehealth: Payer: Self-pay

## 2018-07-03 DIAGNOSIS — I5022 Chronic systolic (congestive) heart failure: Secondary | ICD-10-CM | POA: Diagnosis not present

## 2018-07-03 DIAGNOSIS — I482 Chronic atrial fibrillation: Secondary | ICD-10-CM | POA: Diagnosis not present

## 2018-07-03 DIAGNOSIS — I959 Hypotension, unspecified: Secondary | ICD-10-CM | POA: Diagnosis not present

## 2018-07-03 DIAGNOSIS — K922 Gastrointestinal hemorrhage, unspecified: Secondary | ICD-10-CM | POA: Diagnosis not present

## 2018-07-03 DIAGNOSIS — I255 Ischemic cardiomyopathy: Secondary | ICD-10-CM | POA: Diagnosis not present

## 2018-07-03 DIAGNOSIS — I10 Essential (primary) hypertension: Secondary | ICD-10-CM | POA: Diagnosis not present

## 2018-07-03 LAB — CBC
HCT: 35.3 % — ABNORMAL LOW (ref 39.0–52.0)
Hemoglobin: 11.6 g/dL — ABNORMAL LOW (ref 13.0–17.0)
MCH: 31.4 pg (ref 26.0–34.0)
MCHC: 32.9 g/dL (ref 30.0–36.0)
MCV: 95.7 fL (ref 78.0–100.0)
PLATELETS: 182 10*3/uL (ref 150–400)
RBC: 3.69 MIL/uL — ABNORMAL LOW (ref 4.22–5.81)
RDW: 14.1 % (ref 11.5–15.5)
WBC: 7.9 10*3/uL (ref 4.0–10.5)

## 2018-07-03 MED ORDER — METOPROLOL TARTRATE 5 MG/5ML IV SOLN
2.5000 mg | Freq: Four times a day (QID) | INTRAVENOUS | Status: DC | PRN
  Filled 2018-07-03: qty 5

## 2018-07-03 NOTE — Discharge Summary (Signed)
Physician Discharge Summary  [name] [name] MVH:[id number] DOB: 1932-09-24 DOA: 07/01/2018  PCP: Crist Infante, MD  Admit date: 07/01/2018 Discharge date: 07/03/2018  Admitted From: home Disposition:  Home  Recommendations for Outpatient Follow-up:  1. Follow up with PCP in 1-2 weeks 2. Please obtain CBC in one week 3.   Home Health:no Equipment/Devices:none  Discharge Condition:stable CODE STATUS:full Diet recommendation: Heart Healthy   Brief/Interim Summary: 84 y.o. male past medical history significant for coronary artery disease status post CABG 8413, systolic heart failure with rectal cancer status post 2 interventions, chronic atrial for ablation not a candidate for anti-correlation due to GI bleed who comes in for melanotic stools and bright red blood per rectum.  Discharge Diagnoses:  Active Problems:   Atrial fibrillation, chronic (HCC)   Ischemic cardiomyopathy   Chronic systolic CHF (congestive heart failure) (HCC)   Essential hypertension   Lower GI bleed  Lower GI bleed melanotic stools: He was admitted to the month hospital remain asymptomatic CT scan was done that showed no masses. GI was consulted and recommended no further evaluation to follow-up with them as an outpatient.  Chronic atrial fibrillation: Not a candidate for anticoagulation due to GI bleed, no changes were made continue beta-blockers.  Chronic systolic heart failure: She appears to be euvolemic no changes were made to his medication.  Essential hypertension: No changes made to his medication.  Discharge Instructions  Discharge Instructions    Diet - low sodium heart healthy   Complete by:  As directed    Increase activity slowly   Complete by:  As directed      Allergies as of 07/03/2018      Reactions   Aspirin    Bleeding   Iron    Stomach issues   Lescol [fluvastatin Sodium]    Dizziness   Pentazocine Lactate    unknown   Vytorin [ezetimibe-simvastatin]    Leg cramps       Medication List    TAKE these medications   cholecalciferol 1000 units tablet Commonly known as:  VITAMIN D Take 1,000 Units by mouth daily.   digoxin 0.125 MG tablet Commonly known as:  LANOXIN TAKE 1/2 TABLET EVERY DAY   diphenoxylate-atropine 2.5-0.025 MG tablet Commonly known as:  LOMOTIL Take 2 tablets by mouth 2 (two) times daily as needed for diarrhea or loose stools. For diarrhea   furosemide 40 MG tablet Commonly known as:  LASIX Take 1 tablet (40 mg total) by mouth daily. Take 40 mg alternating with 20 mg daily   metoprolol succinate 100 MG 24 hr tablet Commonly known as:  TOPROL-XL TAKE 1 TABLET EVERY DAY   metoprolol tartrate 50 MG tablet Commonly known as:  LOPRESSOR Take 0.5 tablets (25 mg total) by mouth daily. What changed:  how much to take   multivitamins ther. w/minerals Tabs tablet Take 1 tablet by mouth daily.   nitroGLYCERIN 0.4 MG SL tablet Commonly known as:  NITROSTAT Place 1 tablet (0.4 mg total) under the tongue every 5 (five) minutes as needed for chest pain.   PROAIR HFA 108 (90 Base) MCG/ACT inhaler Generic drug:  albuterol Inhale 2 puffs into the lungs every 4 (four) hours as needed for shortness of breath.   ramipril 10 MG capsule Commonly known as:  ALTACE TAKE 1 CAPSULE EVERY DAY   spironolactone 25 MG tablet Commonly known as:  ALDACTONE Take 1 tablet (25 mg total) by mouth daily.   temazepam 7.5 MG capsule Commonly known as:  RESTORIL  Take 1 capsule (7.5 mg total) by mouth at bedtime as needed for sleep.   vitamin B-12 1000 MCG tablet Commonly known as:  CYANOCOBALAMIN Take 1,000 mcg by mouth daily.       Allergies  Allergen Reactions  . Aspirin     Bleeding   . Iron     Stomach issues  . Lescol [Fluvastatin Sodium]     Dizziness  . Pentazocine Lactate     unknown  . Vytorin [Ezetimibe-Simvastatin]     Leg cramps     Consultations:  None   Procedures/Studies: Ct Abdomen Pelvis W Contrast  Result  Date: 07/01/2018 CLINICAL DATA:  Abdominal pain suspected diverticulitis, colon cancer, started passing blood in loose stools last night, history coronary artery disease post MI and CABG, cardiomyopathy, chronic systolic CHF, hypertension, atrial fibrillation, former smoker EXAM: CT ABDOMEN AND PELVIS WITH CONTRAST TECHNIQUE: Multidetector CT imaging of the abdomen and pelvis was performed using the standard protocol following bolus administration of intravenous contrast. Sagittal and coronal MPR images reconstructed from axial data set. CONTRAST:  164mL ISOVUE-300 IOPAMIDOL (ISOVUE-300) INJECTION 61% IV. No oral contrast. COMPARISON:  02/22/2008 FINDINGS: Lower chest: Bibasilar interstitial infiltrates which could represent infection or chronic interstitial lung disease, progressive since 2009. Hepatobiliary: Gallbladder surgically absent. Significant biliary dilatation with CBD 15 mm diameter tapering to 11 mm distally. Mild intrahepatic biliary dilatation. No focal hepatic lesions. Pancreas: Atrophic pancreas. Spleen: Normal appearance.  Surgical clips at hilum. Adrenals/Urinary Tract: Exophytic intermediate attenuation nodule posterior LEFT kidney 13 x 11 mm image 27 previously 9 x 7 mm, demonstrating washout on delayed images question small renal neoplasm. Additional BILATERAL renal cysts. No hydronephrosis or hydroureter. Unremarkable adrenal glands, ureters, and bladder Stomach/Bowel: Prior ileocolic resection with anastomosis in the RIGHT mid abdomen. Few dilated loops of small bowel are seen within the pelvis with a questionable transition zone to normal caliber small bowel in the RIGHT mid pelvis image 51. Rectum and stomach incompletely distended with suboptimal assessment of wall thickness. Minimal sigmoid diverticulosis. Remaining bowel loops unremarkable. Vascular/Lymphatic: Extensive atherosclerotic calcifications aorta, iliac arteries, visceral artery origins, and coronary arteries. Enlargement of  cardiac chambers with pacemaker leads RIGHT atrium and RIGHT ventricle. No adenopathy. Reproductive: Mild prostatic enlargement. Other: No free air or free fluid. No definite hernia or acute inflammatory process. Musculoskeletal: Osseous demineralization with scoliosis and degenerative changes of the lumbar spine. IMPRESSION: Increased biliary dilatation since 2009, CBD 15 mm diameter, recommend correlation with LFTs. Minimal sigmoid diverticulosis. Unable to exclude gastric and rectal wall thickening though these could be areas of artifact from underdistention. Extensive atherosclerotic calcifications aorta, branch vessels, and coronary arteries. 13 x 11 mm enhancing nodule at the posterior margin of the LEFT kidney concerning for a small renal neoplasm. Electronically Signed   By: Lavonia Dana M.D.   On: 07/01/2018 17:06      Subjective: No new complaints feels great.  Discharge Exam: Vitals:   07/03/18 0508 07/03/18 0838  BP: 120/60   Pulse: 73 90  Resp: 20   Temp: (!) 97.4 F (36.3 C)   SpO2: 95%    Vitals:   07/02/18 1619 07/02/18 2135 07/03/18 0508 07/03/18 0838  BP: 116/71 122/85 120/60   Pulse: (!) 51 71 73 90  Resp: 16 18 20    Temp: (!) 97.3 F (36.3 C) 97.7 F (36.5 C) (!) 97.4 F (36.3 C)   TempSrc: Oral Oral Oral   SpO2: 100% 97% 95%   Weight:      Height:  General: Pt is alert, awake, not in acute distress Cardiovascular: RRR, S1/S2 +, no rubs, no gallops Respiratory: CTA bilaterally, no wheezing, no rhonchi Abdominal: Soft, NT, ND, bowel sounds + Extremities: no edema, no cyanosis    The results of significant diagnostics from this hospitalization (including imaging, microbiology, ancillary and laboratory) are listed below for reference.     Microbiology: No results found for this or any previous visit (from the past 240 hour(s)).   Labs: BNP (last 3 results) No results for input(s): BNP in the last 8760 hours. Basic Metabolic Panel: Recent Labs   Lab 07/01/18 1510  NA 142  K 4.2  CL 107  CO2 27  GLUCOSE 120*  BUN 27*  CREATININE 1.19  CALCIUM 9.1   Liver Function Tests: Recent Labs  Lab 07/01/18 1510  AST 30  ALT 19  ALKPHOS 36*  BILITOT 0.5  PROT 5.9*  ALBUMIN 3.4*   Recent Labs  Lab 07/01/18 1510  LIPASE 19   No results for input(s): AMMONIA in the last 168 hours. CBC: Recent Labs  Lab 07/01/18 1510 07/02/18 0445 07/02/18 1201 07/03/18 0855  WBC 6.9 5.4  --  7.9  NEUTROABS 5.8  --   --   --   HGB 11.0* 10.4* 10.8* 11.6*  HCT 34.1* 32.2*  --  35.3*  MCV 96.6 96.7  --  95.7  PLT 210 175  --  182   Cardiac Enzymes: No results for input(s): CKTOTAL, CKMB, CKMBINDEX, TROPONINI in the last 168 hours. BNP: Invalid input(s): POCBNP CBG: No results for input(s): GLUCAP in the last 168 hours. D-Dimer No results for input(s): DDIMER in the last 72 hours. Hgb A1c No results for input(s): HGBA1C in the last 72 hours. Lipid Profile No results for input(s): CHOL, HDL, LDLCALC, TRIG, CHOLHDL, LDLDIRECT in the last 72 hours. Thyroid function studies No results for input(s): TSH, T4TOTAL, T3FREE, THYROIDAB in the last 72 hours.  Invalid input(s): FREET3 Anemia work up No results for input(s): VITAMINB12, FOLATE, FERRITIN, TIBC, IRON, RETICCTPCT in the last 72 hours. Urinalysis    Component Value Date/Time   COLORURINE YELLOW 10/14/2013 1510   APPEARANCEUR CLEAR 10/14/2013 1510   LABSPEC 1.017 10/14/2013 1510   PHURINE 6.0 10/14/2013 1510   GLUCOSEU NEGATIVE 10/14/2013 1510   HGBUR NEGATIVE 10/14/2013 1510   BILIRUBINUR NEGATIVE 10/14/2013 1510   KETONESUR NEGATIVE 10/14/2013 1510   PROTEINUR 30 (A) 10/14/2013 1510   UROBILINOGEN 1.0 10/14/2013 1510   NITRITE NEGATIVE 10/14/2013 1510   LEUKOCYTESUR NEGATIVE 10/14/2013 1510   Sepsis Labs Invalid input(s): PROCALCITONIN,  WBC,  LACTICIDVEN Microbiology No results found for this or any previous visit (from the past 240 hour(s)).   Time  coordinating discharge: 35 minutes  SIGNED:   Charlynne Cousins, MD  [facility] 07/03/2018, 10:47 AM Pager   If 7PM-7AM, please contact night-coverage [email or web address] Password [email or web address]

## 2018-07-03 NOTE — Telephone Encounter (Signed)
-----   Message from [name], [place] sent at 07/03/2018  9:52 AM EDT ----- Regarding: Needs follow up Dr. Bonney Leitz in 2-3 weeks. Gi Bleed.  Thanks_JLL

## 2018-07-03 NOTE — Evaluation (Signed)
Physical Therapy Evaluation Patient Details Name: [name] [name] MRN: [id number] DOB: [birth year] Today's Date: 07/03/2018   History of Present Illness  84 yo male admitted with LGIB. Hx of CHF, A fib, CABG, rectal ca, HF  Clinical Impression  On eval, pt was Min guard assist for mobility. He walked ~75 feet with a RW. HR up to 140s-150s at times (nursing aware). Pt does well with walker. Encouraged him to consider using one. Daughter stated they already have a prescription for a rollator. Discussed d/c plan-pt will return home with his wife. He declined HHPT f/u.     Follow Up Recommendations No PT follow up;Supervision for mobility/OOB(pt politely declined HHPT f/u)    Equipment Recommendations  (encouraged rollator use for safety)    Recommendations for Other Services       Precautions / Restrictions Precautions Precautions: Fall Precaution Comments: monitor HR Restrictions Weight Bearing Restrictions: No      Mobility  Bed Mobility Overal bed mobility: Modified Independent                Transfers Overall transfer level: Needs assistance   Transfers: Sit to/from Stand Sit to Stand: Min guard         General transfer comment: close guard for safety. Unsteady.   Ambulation/Gait Ambulation/Gait assistance: Min guard Gait Distance (Feet): 75 Feet Assistive device: Rolling walker (2 wheeled) Gait Pattern/deviations: Step-through pattern;Trunk flexed     General Gait Details: close guard for safety. Limited distance due to HR elevation-up to 140s-150s at times (nursing aware). Pt denies lightheadedness. Dyspnea 2/4  Stairs            Wheelchair Mobility    Modified Rankin (Stroke Patients Only)       Balance Overall balance assessment: Needs assistance           Standing balance-Leahy Scale: Fair                               Pertinent Vitals/Pain Pain Assessment: No/denies pain    Home Living Family/patient expects to be  discharged to:: Private residence Living Arrangements: Spouse/significant other Available Help at Discharge: Family Type of Home: Apartment Home Access: Level entry     Home Layout: One level Home Equipment: (walking stick) Additional Comments: family has prescription for rollator    Prior Function Level of Independence: Independent               Hand Dominance        Extremity/Trunk Assessment   Upper Extremity Assessment Upper Extremity Assessment: Overall WFL for tasks assessed    Lower Extremity Assessment Lower Extremity Assessment: Generalized weakness    Cervical / Trunk Assessment Cervical / Trunk Assessment: Kyphotic  Communication   Communication: No difficulties  Cognition Arousal/Alertness: Awake/alert Behavior During Therapy: WFL for tasks assessed/performed Overall Cognitive Status: Within Functional Limits for tasks assessed                                        General Comments      Exercises     Assessment/Plan    PT Assessment Patient needs continued PT services  PT Problem List Decreased mobility       PT Treatment Interventions Gait training;Therapeutic activities;Therapeutic exercise;Functional mobility training;DME instruction    PT Goals (Current goals can be found in the Care Plan section)  Acute Rehab PT Goals Patient Stated Goal: home PT Goal Formulation: With patient/family Time For Goal Achievement: 07/17/18 Potential to Achieve Goals: Good    Frequency Min 3X/week   Barriers to discharge        Co-evaluation               AM-PAC PT "6 Clicks" Daily Activity  Outcome Measure Difficulty turning over in bed (including adjusting bedclothes, sheets and blankets)?: None Difficulty moving from lying on back to sitting on the side of the bed? : None Difficulty sitting down on and standing up from a chair with arms (e.g., wheelchair, bedside commode, etc,.)?: A Little Help needed moving to and  from a bed to chair (including a wheelchair)?: A Little Help needed walking in hospital room?: A Little Help needed climbing 3-5 steps with a railing? : A Little 6 Click Score: 20    End of Session Equipment Utilized During Treatment: Gait belt Activity Tolerance: Patient tolerated treatment well Patient left: in bed;with call bell/phone within reach;with family/visitor present   PT Visit Diagnosis: Unsteadiness on feet (R26.81)    Time: 5784-6962 PT Time Calculation (min) (ACUTE ONLY): 14 min   Charges:   PT Evaluation $PT Eval Moderate Complexity: 1 Mod            Weston Anna, MPT Pager: [phone]

## 2018-07-03 NOTE — Telephone Encounter (Signed)
appt made with Ellouise Newer for 07/24/18 2:45 pm.  Pt to be notified at discharge

## 2018-07-03 NOTE — Progress Notes (Addendum)
Progress Note   Subjective  Chief Complaint: GI bleed, history of colon cancer  Patient reports 2 more bowel movements with "only a small amount of bright red blood" one last night and one this morning.  Continues to deny any other complaints.   Objective   Vital signs in last 24 hours: Temp:  [97.3 F (36.3 C)-97.7 F (36.5 C)] 97.4 F (36.3 C) (08/06 0508) Pulse Rate:  [51-98] 90 (08/06 0838) Resp:  [16-20] 20 (08/06 0508) BP: (116-122)/(60-85) 120/60 (08/06 0508) SpO2:  [95 %-100 %] 95 % (08/06 0508) Last BM Date: 07/03/18 General:    Elderly white male in NAD Heart:  Regular rate and rhythm; no murmurs Lungs: Respirations even and unlabored, lungs CTA bilaterally Abdomen:  Soft, nontender and nondistended. Normal bowel sounds. Extremities:  Without edema. Neurologic:  Alert and oriented,  grossly normal neurologically. Psych:  Cooperative. Normal mood and affect.  Intake/Output from previous day: 08/05 0701 - 08/06 0700 In: 0  Out: 425 [Urine:425]  Lab Results: Recent Labs    07/01/18 1510 07/02/18 0445 07/02/18 1201 07/03/18 0855  WBC 6.9 5.4  --  7.9  HGB 11.0* 10.4* 10.8* 11.6*  HCT 34.1* 32.2*  --  35.3*  PLT 210 175  --  182   BMET Recent Labs    07/01/18 1510  NA 142  K 4.2  CL 107  CO2 27  GLUCOSE 120*  BUN 27*  CREATININE 1.19  CALCIUM 9.1   LFT Recent Labs    07/01/18 1510  PROT 5.9*  ALBUMIN 3.4*  AST 30  ALT 19  ALKPHOS 36*  BILITOT 0.5   PT/INR Recent Labs    07/01/18 1510  LABPROT 13.8  INR 1.07    Studies/Results: Ct Abdomen Pelvis W Contrast  Result Date: 07/01/2018 CLINICAL DATA:  Abdominal pain suspected diverticulitis, colon cancer, started passing blood in loose stools last night, history coronary artery disease post MI and CABG, cardiomyopathy, chronic systolic CHF, hypertension, atrial fibrillation, former smoker EXAM: CT ABDOMEN AND PELVIS WITH CONTRAST TECHNIQUE: Multidetector CT imaging of the abdomen and  pelvis was performed using the standard protocol following bolus administration of intravenous contrast. Sagittal and coronal MPR images reconstructed from axial data set. CONTRAST:  184mL ISOVUE-300 IOPAMIDOL (ISOVUE-300) INJECTION 61% IV. No oral contrast. COMPARISON:  02/22/2008 FINDINGS: Lower chest: Bibasilar interstitial infiltrates which could represent infection or chronic interstitial lung disease, progressive since 2009. Hepatobiliary: Gallbladder surgically absent. Significant biliary dilatation with CBD 15 mm diameter tapering to 11 mm distally. Mild intrahepatic biliary dilatation. No focal hepatic lesions. Pancreas: Atrophic pancreas. Spleen: Normal appearance.  Surgical clips at hilum. Adrenals/Urinary Tract: Exophytic intermediate attenuation nodule posterior LEFT kidney 13 x 11 mm image 27 previously 9 x 7 mm, demonstrating washout on delayed images question small renal neoplasm. Additional BILATERAL renal cysts. No hydronephrosis or hydroureter. Unremarkable adrenal glands, ureters, and bladder Stomach/Bowel: Prior ileocolic resection with anastomosis in the RIGHT mid abdomen. Few dilated loops of small bowel are seen within the pelvis with a questionable transition zone to normal caliber small bowel in the RIGHT mid pelvis image 51. Rectum and stomach incompletely distended with suboptimal assessment of wall thickness. Minimal sigmoid diverticulosis. Remaining bowel loops unremarkable. Vascular/Lymphatic: Extensive atherosclerotic calcifications aorta, iliac arteries, visceral artery origins, and coronary arteries. Enlargement of cardiac chambers with pacemaker leads RIGHT atrium and RIGHT ventricle. No adenopathy. Reproductive: Mild prostatic enlargement. Other: No free air or free fluid. No definite hernia or acute inflammatory process. Musculoskeletal: Osseous demineralization  with scoliosis and degenerative changes of the lumbar spine. IMPRESSION: Increased biliary dilatation since 2009, CBD  15 mm diameter, recommend correlation with LFTs. Minimal sigmoid diverticulosis. Unable to exclude gastric and rectal wall thickening though these could be areas of artifact from underdistention. Extensive atherosclerotic calcifications aorta, branch vessels, and coronary arteries. 13 x 11 mm enhancing nodule at the posterior margin of the LEFT kidney concerning for a small renal neoplasm. Electronically Signed   By: Lavonia Dana M.D.   On: 07/01/2018 17:06       Assessment / Plan:   Assessment: 1.  Lower GI bleed: Hemoglobin dropped from 13-11-10.4-now 11.6, last small amount of blood was this morning, CT showing some rectal thickening, continues could consider flex sig as outpatient 2.  AB LA 3.  Chronic A. Fib: Not on anticoagulation due to history of GI bleed 4.  Ischemic cardiomyopathy/chronic systolic CHF  Plan: 1.  Okay with patient discharge today.  Hemoglobin has improved 2.  Patient can follow with [place] in our outpatient clinic in 2-3 weeks or sooner if necessary 3.  Patient will need recheck of hemoglobin in a couple of weeks if any further signs of bleeding 4.  We will sign off.  Thank you for your kind consultation.   LOS: 2 days   [name]  07/03/2018, 9:45 AM   Attending physician's note   I have reviewed the chart.  I agree with the Advanced Practitioner's note, impression and recommendations.   Minimal rectal bleeding today. Hemoglobin is better. Patient adamantly refuses colonoscopy. Discharged today with GI follow-up.   Carmell Austria, MD

## 2018-07-03 NOTE — Progress Notes (Signed)
Pt discharged from the unit via wheelchair. Discharge instructions were reviewed with pt and family members. No questions or concerns at this time.  

## 2018-07-06 ENCOUNTER — Other Ambulatory Visit: Payer: Self-pay

## 2018-07-06 ENCOUNTER — Telehealth: Payer: Self-pay | Admitting: Cardiology

## 2018-07-06 DIAGNOSIS — H25043 Posterior subcapsular polar age-related cataract, bilateral: Secondary | ICD-10-CM | POA: Diagnosis not present

## 2018-07-06 DIAGNOSIS — H2513 Age-related nuclear cataract, bilateral: Secondary | ICD-10-CM | POA: Diagnosis not present

## 2018-07-06 DIAGNOSIS — H02831 Dermatochalasis of right upper eyelid: Secondary | ICD-10-CM | POA: Diagnosis not present

## 2018-07-06 DIAGNOSIS — H25013 Cortical age-related cataract, bilateral: Secondary | ICD-10-CM | POA: Diagnosis not present

## 2018-07-06 DIAGNOSIS — H2511 Age-related nuclear cataract, right eye: Secondary | ICD-10-CM | POA: Diagnosis not present

## 2018-07-06 DIAGNOSIS — H18413 Arcus senilis, bilateral: Secondary | ICD-10-CM | POA: Diagnosis not present

## 2018-07-06 NOTE — Patient Outreach (Signed)
[place] [facility]) Care Management  07/06/2018  [name] [name] December 19, 1931 [id number]   EMMI: general discharge Referral date: 07/06/18 Referral reason: know who to call about changes in condition: NO Insurance: [employer] Day # 1   Telephone call to patient emmi general discharge red alert. HIPAA verified with patient. Explained reason for call. Patient states he is not sure who to call about changes.  Patient states he is not having anymore bleeding and not having any more problems at this time.  RNCM advised patient to follow up with his primary MD if he is having any new symptoms or concerns. Patient denies having a follow appointment. Patient states he spoke with his primary MD office on yesterday and they wanted to schedule a follow up appointment.  Patient states, " I am not going to make another appointment to go in there to tell them everything. I'm not getting ready to pay for an extra visit for that. "  RNCM explained to patient importance of follow up appointment with doctor after discharge from the hospital. Patient request call back at a later time. Patient states he is getting ready to leave for an eye doctor appointment.    PLAN: RNCM will attempt 2nd telephone call to patient within 4 business days.   Quinn Plowman RN,BSN,CCM [facility] Telephonic  [phone]

## 2018-07-06 NOTE — Telephone Encounter (Signed)
Cardiac Clearance received from [place] on 07/06/18, Appt 08/20/18 @ 9:00AM. [place]

## 2018-07-10 ENCOUNTER — Other Ambulatory Visit: Payer: Self-pay

## 2018-07-10 NOTE — Telephone Encounter (Signed)
This encounter was created in error - please disregard.

## 2018-07-10 NOTE — Patient Outreach (Signed)
[name]) Care Management  07/10/2018  [name] [name] 1931-12-12 [id number]  EMMI: general discharge Referral date: 07/06/18 Referral reason: know who to call about changes in condition: NO Insurance: [employer] Day # 1 Attempt #1  Telephone call to patient regarding referral. Unable to reach patient. HIPAA compliant voice message left with call back phone number.   PLAN: RNCM will attempt 2nd telephone call to patient within 4 business days. RNCM will send outreach letter.   Quinn Plowman RN,BSN,CCM [facility] Telephonic  [phone]

## 2018-07-13 ENCOUNTER — Other Ambulatory Visit: Payer: Self-pay

## 2018-07-13 NOTE — Patient Outreach (Signed)
[facility]) Care Management  07/13/2018  MOUHAMAD TEED [birth year] [id number]  EMMI:general discharge Referral date:07/06/18 Referral reason:know who to call about changes in condition: NO Insurance: [employer] Day #1  Telephone call to patient regarding EMMI general discharge follow up. HIPAA verified with patient.  Explained reason for follow up call.  Patient states he is doing fine. Denies having any bleeding and new concerns.  Patient states he had a physical 2 weeks ago.  Denies having scheduled follow up with his doctor. Patient states his primary MD office called and attempted to scheduled an appointment with him after he was discharged from the hospital but states he did not scheduled. Patient states he is not going to pay a copayment for an appointment he doesn't need if he is doing ok and they have run every test they can run.  Patient states he is doing fine and doesn't have any further needs.  RNCM advised patient to notify MD of any changes in condition prior to scheduled appointment. RNCM provided contact name and number: [phone] or main office number [phone] and 24 hour nurse advise line [phone]. Patient agreeable to information being mailed to him.  RNCM verified patient aware of 911 services for urgent/ emergent needs.  PLAN:  RNCM will close patient due to patient being assessed and having no further needs. RNCM will send closure notification to patients primary MD.  [facility] will send patient [facility] brochure/ magnet.   Quinn Plowman RN,BSN,CCM [facility] Telephonic  [phone]

## 2018-07-24 ENCOUNTER — Ambulatory Visit: Payer: Medicare HMO | Admitting: Physician Assistant

## 2018-07-24 ENCOUNTER — Other Ambulatory Visit: Payer: Self-pay | Admitting: Cardiology

## 2018-08-14 ENCOUNTER — Other Ambulatory Visit: Payer: Self-pay | Admitting: Internal Medicine

## 2018-08-18 NOTE — Progress Notes (Signed)
[name] [name] Date of Birth: 1931/12/16 Medical Record #[id number]  History of Present Illness: [name] [name] is seen back today for follow up of CAD and CHF. He has a history of coronary disease with remote inferior lateral myocardial infarction. He is status post CABG in 2002. He has an ischemic cardiomyopathy with ejection fraction of 35-40% by Echo in November 2012. He is s/p prior ICD implant with last change out in [facility] 2016. He also has a history of atrial fibrillation. In November 2016 he underwent change out of the ICD to a PPM only. He had only had ICD therapies for Afib with RVR before.   He was admitted August 4-6 2019 with Acute GI bleed. Hbg dropped to 10.4 and stabilized. Bleeding stopped. CT showed no masses. Seen by GI and no further work up recommended.   On follow up today he complains that he is more SOB. No chest pain. Sleeps on 2-3 pillows chronically. No PND. Notes his balance is bad and he has fallen 3 times in last 3 weeks just losing his balance.  Still works 2-3 days/week.Prior attempts to increase beta blocker resulted in marked fatigue.  No dizziness or syncope. No palpitations. He has swelling in his legs.   ICD device check September 2019 showed device was functioning normally. Rate control was felt to be fairly good with few high rate episodes.   Current Outpatient Medications on File Prior to Visit  Medication Sig Dispense Refill  . cholecalciferol (VITAMIN D) 1000 UNITS tablet Take 1,000 Units by mouth daily.     . digoxin (LANOXIN) 0.125 MG tablet TAKE 1/2 TABLET EVERY DAY 45 tablet 6  . diphenoxylate-atropine (LOMOTIL) 2.5-0.025 MG per tablet Take 2 tablets by mouth 2 (two) times daily as needed for diarrhea or loose stools. For diarrhea     . furosemide (LASIX) 40 MG tablet Take 1 tablet (40 mg total) by mouth daily. Take 40 mg alternating with 20 mg daily 90 tablet 3  . metoprolol (LOPRESSOR) 50 MG tablet Take 0.5 tablets (25 mg total) by mouth daily. (Patient  taking differently: Take 12.5 mg by mouth daily. ) 180 tablet 3  . metoprolol succinate (TOPROL-XL) 100 MG 24 hr tablet TAKE 1 TABLET EVERY DAY 90 tablet 3  . Multiple Vitamins-Minerals (MULTIVITAMINS THER. W/MINERALS) TABS Take 1 tablet by mouth daily.      . nitroGLYCERIN (NITROSTAT) 0.4 MG SL tablet Place 1 tablet (0.4 mg total) under the tongue every 5 (five) minutes as needed for chest pain. 25 tablet 6  . PROAIR HFA 108 (90 BASE) MCG/ACT inhaler Inhale 2 puffs into the lungs every 4 (four) hours as needed for shortness of breath.     . ramipril (ALTACE) 10 MG capsule TAKE 1 CAPSULE EVERY DAY 90 capsule 3  . spironolactone (ALDACTONE) 25 MG tablet Take 1 tablet (25 mg total) by mouth daily. 90 tablet 3  . temazepam (RESTORIL) 7.5 MG capsule Take 1 capsule (7.5 mg total) by mouth at bedtime as needed for sleep. 30 capsule 0  . vitamin B-12 (CYANOCOBALAMIN) 1000 MCG tablet Take 1,000 mcg by mouth daily.     No current facility-administered medications on file prior to visit.     Allergies  Allergen Reactions  . Aspirin     Bleeding   . Iron     Stomach issues  . Lescol [Fluvastatin Sodium]     Dizziness  . Pentazocine Lactate     unknown  . Vytorin [Ezetimibe-Simvastatin]  Leg cramps     Past Medical History:  Diagnosis Date  . CAD (coronary artery disease)    Old inferolateral MI s/p CABG in 2002  . CAP (community acquired pneumonia) 10/03/2011  . Cardiomyopathy    s/p ICD 09/30/08; EF 35 to 40% per echo in October 2021  . Chronic systolic CHF (congestive heart failure) ([place]) 07/24/2013  . Colon cancer ([place])    s/p chemo, RT, sugery  . Colorectal anastomotic stricture   . Diverticulosis   . ED (erectile dysfunction)   . GERD (gastroesophageal reflux disease)    rarely  . HTN (hypertension)   . ICD (implantable cardiac defibrillator) discharge October 2012   ICD shock for afib with RVR  . Iron deficiency anemia   . Paroxysmal atrial fibrillation (HCC)    controlled  with amiodarone, not a coumadin candidate due to GI bleeding    Past Surgical History:  Procedure Laterality Date  . APPENDECTOMY    . BACK SURGERY    . CARDIAC CATHETERIZATION  11/07/2000   EF 32%  . CARDIAC DEFIBRILLATOR PLACEMENT  09/30/08   MDT dual chamber ICD placed by Dr Rayann Heman  . CARDIOVASCULAR STRESS TEST  08/06/2008   EF 30%  . CHOLECYSTECTOMY    . COLON RESECTION     x2  . CORONARY ARTERY BYPASS GRAFT    . EP IMPLANTABLE DEVICE N/A 10/15/2015   Procedure:  PPM Generator Changeout;  Surgeon: [name], MD;  Location: [place] [facility];  Service: Cardiovascular;  Laterality: N/A;  . HERNIA REPAIR    . US ECHOCARDIOGRAPHY  08/11/2008   EF 30-35%  . VASECTOMY      Social History   Tobacco Use  Smoking Status Former Smoker  . Types: Cigars  Smokeless Tobacco Never Used    Social History   Substance and Sexual Activity  Alcohol Use No    Family History  Problem Relation Age of Onset  . Colon cancer Father   . Diabetes Sister   . Colon polyps Brother   . Pancreatic cancer Brother 71    Review of Systems: The review of systems is per the HPI. He does note some intermittent incontinence of stool.    All other systems were reviewed and are negative.  Physical Exam: BP 101/67   Pulse 84   Ht 5\' 11"  (1.803 m)   Wt 153 lb (69.4 kg)   BMI 21.34 kg/m  GENERAL:  Elderly WM in NAD. Walks with a cane. Kyphotic.  HEENT:  PERRL, EOMI, sclera are clear. Oropharynx is clear. NECK:  No jugular venous distention, carotid upstroke brisk and symmetric, no bruits, no thyromegaly or adenopathy LUNGS:  Clear to auscultation bilaterally CHEST:  Unremarkable HEART:  IRRR,  PMI not displaced or sustained,S1 and S2 within normal limits, no S3, no S4: no clicks, no rubs, gr 2/6 systolic murmur at apex. ABD:  Soft, nontender. BS +, no masses or bruits. No hepatomegaly, no splenomegaly EXT:  2 + pulses throughout, 2+ pretibial edema, no cyanosis no clubbing SKIN:  Warm and  dry.  No rashes NEURO:  Alert and oriented x 3. Cranial nerves II through XII intact. PSYCH:  Cognitively intact      LABORATORY DATA:  Lab Results  Component Value Date   WBC 7.9 07/03/2018   HGB 11.6 (L) 07/03/2018   HCT 35.3 (L) 07/03/2018   PLT 182 07/03/2018   GLUCOSE 120 (H) 07/01/2018   ALT 19 07/01/2018   AST 30 07/01/2018  NA 142 07/01/2018   K 4.2 07/01/2018   CL 107 07/01/2018   CREATININE 1.19 07/01/2018   BUN 27 (H) 07/01/2018   CO2 27 07/01/2018   TSH 1.87 07/26/2012   INR 1.07 07/01/2018   HGBA1C  06/11/2007    5.7 (NOTE)   The ADA recommends the following therapeutic goals for glycemic   control related to Hgb A1C measurement:   Goal of Therapy:   < 7.0% Hgb A1C   Action Suggested:  > 8.0% Hgb A1C   Ref:  Diabetes Care, 22, Suppl. 1, 1999   Dig level January 2017- 0.6  Labs dated 03/22/16: cholesterol 152, triglycerides 43, HDL 48, LDL 95. BUN 23, creatinine 1.1.  Dated 04/11/17: cholesterol 156, triglycerides 49, HDL 58, LDL 88. Glucose 123 otherwise CMET normal. TFTs normal. Hgb 14.2. Dig level 0.5 Dated 06/18/18: cholesterol 126, triglycerides 44, HDL 50, LDL 67.   Ecg today shows Afib with rat e 93. Nonspecific IVCD. ST T changes c/w dig effect. No change compared to March 2018.   Echo: 01/04/16:Study Conclusions  - Left ventricle: Inferior wall hypokinesis The cavity size was moderately dilated. Wall thickness was increased in a pattern of mild LVH. Systolic function was mildly to moderately reduced. The estimated ejection fraction was in the range of 40% to 45%. - Aortic valve: There was mild regurgitation. - Mitral valve: There was moderate regurgitation. - Left atrium: The atrium was moderately dilated. - Right ventricle: The cavity size was mildly dilated. - Right atrium: The atrium was mildly dilated. - Atrial septum: No defect or patent foramen ovale was identified. - Pulmonary arteries: PA peak pressure: 71 mm Hg (S).   Assessment /  Plan: 1. Chronic systolic CHF. EF 40-45% by last Echo. Moderate MR. Pulmonary HTN. Some increase edema on exam. Records indicate he is alternating 40 and 20 mg lasix daily. I asked him to confirm this. Will check BMET and BNP level today. Depending on results may need to increase lasix a little. Will check Dig level as well.   On ACEi, Dig, metoprolol. I wonder if some of his dyspnea is related to recent GI bleed and anemia. Will check a CBC.   2. Atrial fibrillation. Rate is well controlled.  Not a candidate for anticoagulation due to major GI bleed history. Will continue metoprolol to 12.5 mg daily and digoxin. Device check indicates rate control is satisfactory.   3. Ischemic cardiomyopathy as per # 1.   4. Coronary disease with old inferiolateral myocardial infarction. Status post CABG in 2002. He is asymptomatic. Continue metoprolol. He is not a candidate for antiplatelet or anticoagulant therapy because of recurrent GI bleeding.  5. Hypertension, controlled.  6. Hypercholesterolemia.  On low dose Crestor.   7. Recurrent GI bleed. Repeat Hgb.

## 2018-08-20 ENCOUNTER — Encounter: Payer: Self-pay | Admitting: Cardiology

## 2018-08-20 ENCOUNTER — Ambulatory Visit: Payer: Medicare HMO | Admitting: Cardiology

## 2018-08-20 VITALS — BP 101/67 | HR 84 | Ht 71.0 in | Wt 153.0 lb

## 2018-08-20 DIAGNOSIS — I5022 Chronic systolic (congestive) heart failure: Secondary | ICD-10-CM | POA: Diagnosis not present

## 2018-08-20 DIAGNOSIS — I482 Chronic atrial fibrillation, unspecified: Secondary | ICD-10-CM

## 2018-08-20 DIAGNOSIS — I2510 Atherosclerotic heart disease of native coronary artery without angina pectoris: Secondary | ICD-10-CM | POA: Diagnosis not present

## 2018-08-20 DIAGNOSIS — Z95810 Presence of automatic (implantable) cardiac defibrillator: Secondary | ICD-10-CM | POA: Diagnosis not present

## 2018-08-20 DIAGNOSIS — I255 Ischemic cardiomyopathy: Secondary | ICD-10-CM | POA: Diagnosis not present

## 2018-08-20 NOTE — Patient Instructions (Addendum)
We will check blood work today  Continue your current therapy  I will see you in 4 months

## 2018-08-21 LAB — CBC WITH DIFFERENTIAL/PLATELET
BASOS ABS: 0 10*3/uL (ref 0.0–0.2)
Basos: 0 %
EOS (ABSOLUTE): 0 10*3/uL (ref 0.0–0.4)
Eos: 0 %
HEMOGLOBIN: 10.7 g/dL — AB (ref 13.0–17.7)
Hematocrit: 33.2 % — ABNORMAL LOW (ref 37.5–51.0)
IMMATURE GRANS (ABS): 0.1 10*3/uL (ref 0.0–0.1)
Immature Granulocytes: 1 %
LYMPHS: 6 %
Lymphocytes Absolute: 0.4 10*3/uL — ABNORMAL LOW (ref 0.7–3.1)
MCH: 30.1 pg (ref 26.6–33.0)
MCHC: 32.2 g/dL (ref 31.5–35.7)
MCV: 94 fL (ref 79–97)
MONOCYTES: 6 %
Monocytes Absolute: 0.5 10*3/uL (ref 0.1–0.9)
Neutrophils Absolute: 6.6 10*3/uL (ref 1.4–7.0)
Neutrophils: 87 %
Platelets: 275 10*3/uL (ref 150–450)
RBC: 3.55 x10E6/uL — AB (ref 4.14–5.80)
RDW: 14.4 % (ref 12.3–15.4)
WBC: 7.6 10*3/uL (ref 3.4–10.8)

## 2018-08-21 LAB — BASIC METABOLIC PANEL
BUN/Creatinine Ratio: 17 (ref 10–24)
BUN: 21 mg/dL (ref 8–27)
CO2: 24 mmol/L (ref 20–29)
Calcium: 9.4 mg/dL (ref 8.6–10.2)
Chloride: 103 mmol/L (ref 96–106)
Creatinine, Ser: 1.21 mg/dL (ref 0.76–1.27)
GFR calc Af Amer: 63 mL/min/{1.73_m2} (ref >59)
GFR calc non Af Amer: 55 mL/min/{1.73_m2} — ABNORMAL LOW (ref >59)
GLUCOSE: 82 mg/dL (ref 65–99)
Potassium: 5.1 mmol/L (ref 3.5–5.2)
SODIUM: 139 mmol/L (ref 134–144)

## 2018-08-21 LAB — BRAIN NATRIURETIC PEPTIDE: BNP: 479.6 pg/mL — ABNORMAL HIGH (ref 0.0–100.0)

## 2018-08-21 LAB — DIGOXIN LEVEL: DIGOXIN, SERUM: 0.7 ng/mL (ref 0.5–0.9)

## 2018-08-22 ENCOUNTER — Ambulatory Visit (INDEPENDENT_AMBULATORY_CARE_PROVIDER_SITE_OTHER): Payer: Medicare HMO | Admitting: *Deleted

## 2018-08-22 DIAGNOSIS — I255 Ischemic cardiomyopathy: Secondary | ICD-10-CM | POA: Diagnosis not present

## 2018-08-22 NOTE — Progress Notes (Signed)
Remote pacemaker transmission.   

## 2018-08-23 ENCOUNTER — Telehealth: Payer: Self-pay | Admitting: Cardiology

## 2018-08-23 ENCOUNTER — Encounter: Payer: Self-pay | Admitting: Cardiology

## 2018-08-23 NOTE — Telephone Encounter (Signed)
New Message ° ° ° ° ° ° ° ° ° °Patient returned your call °

## 2018-08-23 NOTE — Telephone Encounter (Signed)
Follow up    Patient would like a call at around 4:00 pm.

## 2018-08-23 NOTE — Telephone Encounter (Signed)
Follow up  ° ° °Patient is returning your call. °

## 2018-08-23 NOTE — Telephone Encounter (Signed)
Called patient, LVM to return call

## 2018-08-23 NOTE — Telephone Encounter (Signed)
Calling for labs, will return call at 4:00

## 2018-08-23 NOTE — Telephone Encounter (Signed)
Notes recorded by [name], Peter M, MD on 08/21/2018 at 2:14 PM EDT BNP is a little high. Need to confirm what dose of lasix he is taking.  Peter [name] MD, [facility]  ------  Notes recorded by [name], Peter M, MD on 08/21/2018 at 7:38 AM EDT Hgb 10.7. Other labs look good. BNP level is pending.  Peter [name] MD, [facility]

## 2018-08-23 NOTE — Telephone Encounter (Signed)
Called patient, gave lab results. Patient states that he is taking Lasix 20mg  on Monday, Wednesday, Friday, and 40mg  on Tuesday and Thursday. He states he does not take anything on Saturday and Sunday.   Patient also states he takes his Spironolactone 25 mg daily as well.   Patient verbalized understanding to wait to hear from [place] on if any changes needed to be made.   Thanks!

## 2018-08-27 ENCOUNTER — Other Ambulatory Visit: Payer: Self-pay

## 2018-08-27 MED ORDER — FUROSEMIDE 40 MG PO TABS: ORAL_TABLET | ORAL | 3 refills | Status: DC

## 2018-08-27 NOTE — Telephone Encounter (Signed)
Spoke to patient Dr.Jordan advised to increase lasix to 40 mg daily for 5 days a week and 20 mg on Sat and Sun.

## 2018-08-28 DIAGNOSIS — J069 Acute upper respiratory infection, unspecified: Secondary | ICD-10-CM | POA: Diagnosis not present

## 2018-08-28 DIAGNOSIS — R05 Cough: Secondary | ICD-10-CM | POA: Diagnosis not present

## 2018-08-28 DIAGNOSIS — Z6822 Body mass index (BMI) 22.0-22.9, adult: Secondary | ICD-10-CM | POA: Diagnosis not present

## 2018-09-10 DIAGNOSIS — M859 Disorder of bone density and structure, unspecified: Secondary | ICD-10-CM | POA: Diagnosis not present

## 2018-09-20 LAB — CUP PACEART REMOTE DEVICE CHECK
Battery Remaining Longevity: 134 mo
Battery Remaining Percentage: 95.5 %
Battery Voltage: 3.02 V
Brady Statistic RV Percent Paced: 6.6 %
Date Time Interrogation Session: 20190924060012
Implantable Lead Implant Date: 20091103
Implantable Lead Location: 753860
Lead Channel Pacing Threshold Amplitude: 0.75 V
Lead Channel Sensing Intrinsic Amplitude: 12 mV
Lead Channel Setting Pacing Amplitude: 2.5 V
Lead Channel Setting Pacing Pulse Width: 0.4 ms
MDC IDC LEAD IMPLANT DT: 20091103
MDC IDC LEAD LOCATION: 753859
MDC IDC MSMT LEADCHNL RV IMPEDANCE VALUE: 390 Ohm
MDC IDC MSMT LEADCHNL RV PACING THRESHOLD PULSEWIDTH: 0.4 ms
MDC IDC PG IMPLANT DT: 20161117
MDC IDC SET LEADCHNL RV SENSING SENSITIVITY: 2 mV
Pulse Gen Serial Number: 7804513

## 2018-09-21 ENCOUNTER — Inpatient Hospital Stay (HOSPITAL_COMMUNITY)
Admission: EM | Admit: 2018-09-21 | Discharge: 2018-09-24 | DRG: 378 | Disposition: A | Discharge status: Home / Self Care | Payer: Medicare HMO | Attending: Internal Medicine | Admitting: Internal Medicine

## 2018-09-21 ENCOUNTER — Other Ambulatory Visit: Payer: Self-pay

## 2018-09-21 ENCOUNTER — Encounter (HOSPITAL_COMMUNITY): Payer: Self-pay | Admitting: Emergency Medicine

## 2018-09-21 DIAGNOSIS — I255 Ischemic cardiomyopathy: Secondary | ICD-10-CM | POA: Diagnosis present

## 2018-09-21 DIAGNOSIS — Z888 Allergy status to other drugs, medicaments and biological substances status: Secondary | ICD-10-CM | POA: Diagnosis not present

## 2018-09-21 DIAGNOSIS — K5731 Diverticulosis of large intestine without perforation or abscess with bleeding: Secondary | ICD-10-CM | POA: Diagnosis present

## 2018-09-21 DIAGNOSIS — I2510 Atherosclerotic heart disease of native coronary artery without angina pectoris: Secondary | ICD-10-CM | POA: Diagnosis present

## 2018-09-21 DIAGNOSIS — Z95810 Presence of automatic (implantable) cardiac defibrillator: Secondary | ICD-10-CM | POA: Diagnosis not present

## 2018-09-21 DIAGNOSIS — Z79899 Other long term (current) drug therapy: Secondary | ICD-10-CM

## 2018-09-21 DIAGNOSIS — I110 Hypertensive heart disease with heart failure: Secondary | ICD-10-CM | POA: Diagnosis present

## 2018-09-21 DIAGNOSIS — Z87891 Personal history of nicotine dependence: Secondary | ICD-10-CM | POA: Diagnosis not present

## 2018-09-21 DIAGNOSIS — Z9221 Personal history of antineoplastic chemotherapy: Secondary | ICD-10-CM

## 2018-09-21 DIAGNOSIS — K56699 Other intestinal obstruction unspecified as to partial versus complete obstruction: Secondary | ICD-10-CM | POA: Diagnosis present

## 2018-09-21 DIAGNOSIS — I252 Old myocardial infarction: Secondary | ICD-10-CM | POA: Diagnosis not present

## 2018-09-21 DIAGNOSIS — I5022 Chronic systolic (congestive) heart failure: Secondary | ICD-10-CM | POA: Diagnosis present

## 2018-09-21 DIAGNOSIS — Z886 Allergy status to analgesic agent status: Secondary | ICD-10-CM | POA: Diagnosis not present

## 2018-09-21 DIAGNOSIS — K5730 Diverticulosis of large intestine without perforation or abscess without bleeding: Secondary | ICD-10-CM | POA: Diagnosis not present

## 2018-09-21 DIAGNOSIS — Z923 Personal history of irradiation: Secondary | ICD-10-CM | POA: Diagnosis not present

## 2018-09-21 DIAGNOSIS — Z23 Encounter for immunization: Secondary | ICD-10-CM

## 2018-09-21 DIAGNOSIS — K219 Gastro-esophageal reflux disease without esophagitis: Secondary | ICD-10-CM | POA: Diagnosis present

## 2018-09-21 DIAGNOSIS — Z951 Presence of aortocoronary bypass graft: Secondary | ICD-10-CM | POA: Diagnosis not present

## 2018-09-21 DIAGNOSIS — K633 Ulcer of intestine: Secondary | ICD-10-CM | POA: Diagnosis not present

## 2018-09-21 DIAGNOSIS — K254 Chronic or unspecified gastric ulcer with hemorrhage: Secondary | ICD-10-CM | POA: Diagnosis present

## 2018-09-21 DIAGNOSIS — I4891 Unspecified atrial fibrillation: Secondary | ICD-10-CM | POA: Diagnosis not present

## 2018-09-21 DIAGNOSIS — I480 Paroxysmal atrial fibrillation: Secondary | ICD-10-CM | POA: Diagnosis not present

## 2018-09-21 DIAGNOSIS — K921 Melena: Secondary | ICD-10-CM | POA: Diagnosis present

## 2018-09-21 DIAGNOSIS — K922 Gastrointestinal hemorrhage, unspecified: Secondary | ICD-10-CM

## 2018-09-21 DIAGNOSIS — Z85048 Personal history of other malignant neoplasm of rectum, rectosigmoid junction, and anus: Secondary | ICD-10-CM

## 2018-09-21 DIAGNOSIS — I482 Chronic atrial fibrillation, unspecified: Secondary | ICD-10-CM | POA: Diagnosis present

## 2018-09-21 DIAGNOSIS — I10 Essential (primary) hypertension: Secondary | ICD-10-CM | POA: Diagnosis not present

## 2018-09-21 DIAGNOSIS — K5190 Ulcerative colitis, unspecified, without complications: Secondary | ICD-10-CM | POA: Diagnosis not present

## 2018-09-21 LAB — COMPREHENSIVE METABOLIC PANEL
ALBUMIN: 3.6 g/dL (ref 3.5–5.0)
ALT: 18 U/L (ref 0–44)
ANION GAP: 8 (ref 5–15)
AST: 28 U/L (ref 15–41)
Alkaline Phosphatase: 38 U/L (ref 38–126)
BUN: 26 mg/dL — ABNORMAL HIGH (ref 8–23)
CO2: 24 mmol/L (ref 22–32)
Calcium: 8.9 mg/dL (ref 8.9–10.3)
Chloride: 107 mmol/L (ref 98–111)
Creatinine, Ser: 1.13 mg/dL (ref 0.61–1.24)
GFR calc non Af Amer: 58 mL/min — ABNORMAL LOW (ref >60)
GLUCOSE: 101 mg/dL — AB (ref 70–99)
POTASSIUM: 4.1 mmol/L (ref 3.5–5.1)
Sodium: 139 mmol/L (ref 135–145)
Total Bilirubin: 0.9 mg/dL (ref 0.3–1.2)
Total Protein: 6.6 g/dL (ref 6.5–8.1)

## 2018-09-21 LAB — TYPE AND SCREEN
ABO/RH(D): O POS
Antibody Screen: NEGATIVE

## 2018-09-21 LAB — CBC
HCT: 36.5 % — ABNORMAL LOW (ref 39.0–52.0)
HEMOGLOBIN: 11 g/dL — AB (ref 13.0–17.0)
MCH: 29.3 pg (ref 26.0–34.0)
MCHC: 30.1 g/dL (ref 30.0–36.0)
MCV: 97.1 fL (ref 80.0–100.0)
NRBC: 0 % (ref 0.0–0.2)
PLATELETS: 230 10*3/uL (ref 150–400)
RBC: 3.76 MIL/uL — AB (ref 4.22–5.81)
RDW: 14.3 % (ref 11.5–15.5)
WBC: 9.9 10*3/uL (ref 4.0–10.5)

## 2018-09-21 LAB — DIGOXIN LEVEL: DIGOXIN LVL: 0.4 ng/mL — AB (ref 0.8–2.0)

## 2018-09-21 MED ORDER — PEG-KCL-NACL-NASULF-NA ASC-C 100 G PO SOLR
0.5000 | Freq: Once | ORAL | Status: AC
  Administered 2018-09-21: 100 g via ORAL
  Filled 2018-09-21: qty 1

## 2018-09-21 MED ORDER — ALBUTEROL SULFATE (2.5 MG/3ML) 0.083% IN NEBU: 3.0000 mL | INHALATION_SOLUTION | RESPIRATORY_TRACT | Status: DC | PRN

## 2018-09-21 MED ORDER — DIGOXIN 125 MCG PO TABS
0.0625 mg | ORAL_TABLET | Freq: Every day | ORAL | Status: DC
  Administered 2018-09-21 – 2018-09-24 (×4): 0.0625 mg via ORAL
  Filled 2018-09-21: qty 0.5
  Filled 2018-09-21 (×2): qty 1
  Filled 2018-09-21: qty 0.5

## 2018-09-21 MED ORDER — METOPROLOL TARTRATE 25 MG PO TABS
12.5000 mg | ORAL_TABLET | Freq: Every day | ORAL | Status: DC
  Administered 2018-09-21 – 2018-09-22 (×2): 12.5 mg via ORAL
  Filled 2018-09-21 (×2): qty 1

## 2018-09-21 MED ORDER — DIFLUPREDNATE 0.05 % OP EMUL: 1.0000 [drp] | Freq: Three times a day (TID) | OPHTHALMIC | Status: DC

## 2018-09-21 MED ORDER — SODIUM CHLORIDE 0.9 % IV SOLN
510.0000 mg | Freq: Once | INTRAVENOUS | Status: AC
  Administered 2018-09-21: 510 mg via INTRAVENOUS
  Filled 2018-09-21: qty 17

## 2018-09-21 MED ORDER — PEG-KCL-NACL-NASULF-NA ASC-C 100 G PO SOLR: 1.0000 | Freq: Once | ORAL | Status: DC

## 2018-09-21 MED ORDER — INFLUENZA VAC SPLIT HIGH-DOSE 0.5 ML IM SUSY
0.5000 mL | PREFILLED_SYRINGE | INTRAMUSCULAR | Status: AC
  Administered 2018-09-22: 0.5 mL via INTRAMUSCULAR
  Filled 2018-09-21: qty 0.5

## 2018-09-21 NOTE — ED Provider Notes (Signed)
[facility] DEPT Provider Note   CSN: [id number] Arrival date & time: 09/21/18  4098     History   Chief Complaint Chief Complaint  Patient presents with  . Rectal Bleeding    HPI [name] [name] is a 84 y.o. male.  84 year old male with history of lower GI bleed in the past as well as A. fib presents with bright red and dark red blood per rectum.  He does not take any blood thinners due to his prior history of A. fib.  States that he had a large bowel movement today with some associated abdominal cramping.  Denies any associated fever chills.  No emesis noted.  He showed me a picture of his toilet bowl from home which confirmed bloody stool with bloody water noted.  States he feels weak when he tries to stand up.  Denies any syncope.  No treatment used prior to arrival.     Past Medical History:  Diagnosis Date  . CAD (coronary artery disease)    Old inferolateral MI s/p CABG in 2002  . CAP (community acquired pneumonia) 10/03/2011  . Cardiomyopathy    s/p ICD 09/30/08; EF 35 to 40% per echo in October 2021  . Chronic systolic CHF (congestive heart failure) ([place]) 07/24/2013  . Colon cancer ([place])    s/p chemo, RT, sugery  . Colorectal anastomotic stricture   . Diverticulosis   . ED (erectile dysfunction)   . GERD (gastroesophageal reflux disease)    rarely  . HTN (hypertension)   . ICD (implantable cardiac defibrillator) discharge October 2012   ICD shock for afib with RVR  . Iron deficiency anemia   . Paroxysmal atrial fibrillation (HCC)    controlled with amiodarone, not a coumadin candidate due to GI bleeding    Patient Active Problem List   Diagnosis Date Noted  . Lower GI bleed 07/01/2018  . Symptomatic bradycardia 10/15/2015  . ICD in place- MDT 2009 07/03/2014  . History of GI bleed-  07/03/2014  . Essential hypertension 07/03/2014  . Acute on chronic systolic (congestive) heart failure ([place]) 10/15/2013  . Respiratory failure,  acute ([place]) 10/15/2013  . Bronchitis, acute 10/14/2013  . Oral pharyngeal candidiasis 10/14/2013  . Dizziness 10/14/2013  . Chronic systolic CHF (congestive heart failure) ([place]) 07/24/2013  . Ischemic cardiomyopathy 10/14/2011  . Automatic implantable cardioverter/defibrillator (AICD) activation 10/02/2011  . Atrial fibrillation, chronic 03/21/2011  . CAD (coronary artery disease) 03/21/2011    Past Surgical History:  Procedure Laterality Date  . APPENDECTOMY    . BACK SURGERY    . CARDIAC CATHETERIZATION  11/07/2000   EF 32%  . CARDIAC DEFIBRILLATOR PLACEMENT  09/30/08   MDT dual chamber ICD placed by Dr Rayann Heman  . CARDIOVASCULAR STRESS TEST  08/06/2008   EF 30%  . CHOLECYSTECTOMY    . COLON RESECTION     x2  . CORONARY ARTERY BYPASS GRAFT    . EP IMPLANTABLE DEVICE N/A 10/15/2015   Procedure:  PPM Generator Changeout;  Surgeon: [name], MD;  Location: [facility];  Service: Cardiovascular;  Laterality: N/A;  . HERNIA REPAIR    . US ECHOCARDIOGRAPHY  08/11/2008   EF 30-35%  . VASECTOMY          Home Medications    Prior to Admission medications   Medication Sig Start Date End Date Taking? Authorizing Provider  cholecalciferol (VITAMIN D) 1000 UNITS tablet Take 1,000 Units by mouth daily.     [provider]  digoxin (LANOXIN) 0.125 MG tablet TAKE 1/2 TABLET EVERY DAY 07/25/18   [name], Peter M, MD  diphenoxylate-atropine (LOMOTIL) 2.5-0.025 MG per tablet Take 2 tablets by mouth 2 (two) times daily as needed for diarrhea or loose stools. For diarrhea     [provider]  furosemide (LASIX) 40 MG tablet Take 40 mg daily 5 days a week and 20 mg on Sat and Sun 08/27/18   [name], Peter M, MD  metoprolol (LOPRESSOR) 50 MG tablet Take 0.5 tablets (25 mg total) by mouth daily. Patient taking differently: Take 12.5 mg by mouth daily.  03/24/16   [name], Peter M, MD  metoprolol succinate (TOPROL-XL) 100 MG 24 hr tablet TAKE 1 TABLET EVERY DAY 09/01/17    [name], Peter M, MD  Multiple Vitamins-Minerals (MULTIVITAMINS THER. W/MINERALS) TABS Take 1 tablet by mouth daily.      [provider]  nitroGLYCERIN (NITROSTAT) 0.4 MG SL tablet Place 1 tablet (0.4 mg total) under the tongue every 5 (five) minutes as needed for chest pain. 05/07/14   [name], Peter M, MD  PROAIR HFA 108 (90 BASE) MCG/ACT inhaler Inhale 2 puffs into the lungs every 4 (four) hours as needed for shortness of breath.  10/22/13   [provider]  ramipril (ALTACE) 10 MG capsule TAKE 1 CAPSULE EVERY DAY 04/17/18   [name], Peter M, MD  spironolactone (ALDACTONE) 25 MG tablet Take 1 tablet (25 mg total) by mouth daily. 05/01/18   [name], Peter M, MD  temazepam (RESTORIL) 7.5 MG capsule Take 1 capsule (7.5 mg total) by mouth at bedtime as needed for sleep. 10/17/13   Barton Dubois, MD  vitamin B-12 (CYANOCOBALAMIN) 1000 MCG tablet Take 1,000 mcg by mouth daily.    [provider]    Family History Family History  Problem Relation Age of Onset  . Colon cancer Father   . Diabetes Sister   . Colon polyps Brother   . Pancreatic cancer Brother 56    Social History Social History   Tobacco Use  . Smoking status: Former Smoker    Types: Cigars  . Smokeless tobacco: Never Used  Substance Use Topics  . Alcohol use: No  . Drug use: No     Allergies   Aspirin; Iron; Lescol [fluvastatin sodium]; Pentazocine lactate; and Vytorin [ezetimibe-simvastatin]   Review of Systems Review of Systems  All other systems reviewed and are negative.    Physical Exam Updated Vital Signs BP 106/78 (BP Location: Left Arm)   Pulse (!) 101   SpO2 98%   Physical Exam  Constitutional: He is oriented to person, place, and time. He appears well-developed and well-nourished.  Non-toxic appearance. No distress.  HENT:  Head: Normocephalic and atraumatic.  Eyes: Pupils are equal, round, and reactive to light. Conjunctivae, EOM and lids are normal.  Neck: Normal range of  motion. Neck supple. No tracheal deviation present. No thyroid mass present.  Cardiovascular: Normal rate, regular rhythm and normal heart sounds. Exam reveals no gallop.  No murmur heard. Pulmonary/Chest: Effort normal and breath sounds normal. No stridor. No respiratory distress. He has no decreased breath sounds. He has no wheezes. He has no rhonchi. He has no rales.  Abdominal: Soft. Normal appearance and bowel sounds are normal. He exhibits no distension. There is no tenderness. There is no rebound and no CVA tenderness.  Genitourinary:  Genitourinary Comments: Gross blood per rectum noted  Musculoskeletal: Normal range of motion. He exhibits no edema or tenderness.  Neurological: He is alert and  oriented to person, place, and time. He has normal strength. No cranial nerve deficit or sensory deficit. GCS eye subscore is 4. GCS verbal subscore is 5. GCS motor subscore is 6.  Skin: Skin is warm and dry. No abrasion and no rash noted.  Psychiatric: He has a normal mood and affect. His speech is normal and behavior is normal.  Nursing note and vitals reviewed.    ED Treatments / Results  Labs (all labs ordered are listed, but only abnormal results are displayed) Labs Reviewed  COMPREHENSIVE METABOLIC PANEL  CBC  DIGOXIN LEVEL  DIGOXIN LEVEL  POC OCCULT BLOOD, ED  TYPE AND SCREEN    EKG None  Radiology No results found.  Procedures Procedures (including critical care time)  Medications Ordered in ED Medications - No data to display   Initial Impression / Assessment and Plan / ED Course  I have reviewed the triage vital signs and the nursing notes.  Pertinent labs & imaging results that were available during my care of the patient were reviewed by me and considered in my medical decision making (see chart for details).     Patient here with blood per rectum consistent with prior history of GI bleed.  Hemoglobin is stable.  Patient is also hemodynamically stable.  Will  consult hospitalist for admission  Final Clinical Impressions(s) / ED Diagnoses   Final diagnoses:  None    ED Discharge Orders    None       Lacretia Leigh, MD 09/21/18 [phone]

## 2018-09-21 NOTE — H&P (View-Only) (Signed)
Referring Provider: [facility]   Primary Care Physician:  Crist Infante, MD Primary Gastroenterologist:   [name], MD  Reason for Consultation:  Lower GI bleed    ASSESSMENT AND PLAN:    32. 84 yo male with remote hx of colon cancer x2.  Admitted with recurrent painless rectal bleeding x 1.5 weeks. This is second admission for (low volume) bleeding since early August.  -I think it is reasonable to proceed with a colonoscopy / or at flexible sigmoidoscopy at this point. Patient is in agreement. I spoke with daughter who is also on board.  -Will put on clear liquids in case we decide to proceed tomorrow.   2. Mild [place] anemia. Hgb 11 and stable despite rectal bleeding.   3. Chronic AFIB, not anti-coagulation candidate due to hx of GI bleed  4.  Hx of CAD / remote CABG / chronic systolic heart failure / ischemic cardiomyopathy / chronic afib / ICD placement    HPI: [name] [name] is an 84 y.o. male with CAD / remote CABG, systolic heart failure, ischemic cardiomyopathy , chronic afib, ICD placement and hx of rectal cancer x2 (last one in 2000). He is s/p right hemicolectomy and ? Sigmoid resection. He has a hx of ileocolonic anastomotic stricture, s/p dilation a few years ago. No evidence for recurrent cancer on last colonoscopy which appears to have been in 2009.   Patient was seen in 2014 to discuss surveillance colonoscopy. In the absence of alarm symptoms he decided to forgo colonoscopy given advanced age  Patient was hospitalized early August with GI bleeding. Hgb declined from 13 .8 in 2017 to 10.4. CT scan suggested ? Rectal wall thickening. Patient refused colonoscopy.  History since hospital discharge early August 2019:   Rectal bleeding stopped following recent admission. Then ~ 1.5 weeks ago patient began having recurrent painless rectal bleeding with bowel movements.  Bleeding intermittent . Blood is bright red and on the toilet tissue as well as in the toilet bowl  at times.  Patient denies constipation/straining.  He returned to the ED this a.m..  Hemoglobin 11, actually up from 10.7 late last month.   Patient describes chronic loose stool.  He describes lack of "storage" since bowel surgeries.  He has problems with leakage but per wife, he has recently been incontinent of bowel movements a few occasions.   I ran into the patient's daughter in the hallway, she requested to talk in private.  Patient recently prescribed Lomotil to take 2-4 times a day as needed.  Patient has been taking a couple in the morning, couple at lunch and may be a couple of Lomotil in the evening.  She has seen some mental status changes and wonders if related to Lomotil.  Mental clarity seems to be better over the last couple days since patient has not been taking large amounts of Lomotil  Daughter also reports some occasional shortness of breath at home but this patient saw cardiology late last month and Lasix was increased.    Past Medical History:  Diagnosis Date  . CAD (coronary artery disease)    Old inferolateral MI s/p CABG in 2002  . CAP (community acquired pneumonia) 10/03/2011  . Cardiomyopathy    s/p ICD 09/30/08; EF 35 to 40% per echo in October 2021  . Chronic systolic CHF (congestive heart failure) ([place]) 07/24/2013  . Colon cancer ([facility])    s/p chemo, RT, sugery  . Colorectal anastomotic stricture   . Diverticulosis   .  ED (erectile dysfunction)   . GERD (gastroesophageal reflux disease)    rarely  . HTN (hypertension)   . ICD (implantable cardiac defibrillator) discharge October 2012   ICD shock for afib with RVR  . Iron deficiency anemia   . Paroxysmal atrial fibrillation (HCC)    controlled with amiodarone, not a coumadin candidate due to GI bleeding    Past Surgical History:  Procedure Laterality Date  . APPENDECTOMY    . BACK SURGERY    . CARDIAC CATHETERIZATION  11/07/2000   EF 32%  . CARDIAC DEFIBRILLATOR PLACEMENT  09/30/08   MDT dual chamber  ICD placed by Dr Rayann Heman  . CARDIOVASCULAR STRESS TEST  08/06/2008   EF 30%  . CHOLECYSTECTOMY    . COLON RESECTION     x2  . CORONARY ARTERY BYPASS GRAFT    . EP IMPLANTABLE DEVICE N/A 10/15/2015   Procedure:  PPM Generator Changeout;  Surgeon: [name], MD;  Location: [facility];  Service: Cardiovascular;  Laterality: N/A;  . HERNIA REPAIR    . US ECHOCARDIOGRAPHY  08/11/2008   EF 30-35%  . VASECTOMY      Prior to Admission medications   Medication Sig Start Date End Date Taking? Authorizing Provider  cholecalciferol (VITAMIN D) 1000 UNITS tablet Take 1,000 Units by mouth daily.    Yes [provider]  digoxin (LANOXIN) 0.125 MG tablet TAKE 1/2 TABLET EVERY DAY Patient taking differently: Take 0.0625 mg by mouth daily.  07/25/18  Yes [name], Peter M, MD  diphenoxylate-atropine (LOMOTIL) 2.5-0.025 MG per tablet Take 2 tablets by mouth 2 (two) times daily as needed for diarrhea or loose stools. For diarrhea    Yes [provider]  furosemide (LASIX) 40 MG tablet Take 40 mg daily 5 days a week and 20 mg on Sat and Sun Patient taking differently: Take 20-40 mg by mouth daily. Take 40 mg daily Monday-Friday and 20 mg on Saturday & Sunday 08/27/18  Yes [name], Peter M, MD  metoprolol (LOPRESSOR) 50 MG tablet Take 0.5 tablets (25 mg total) by mouth daily. Patient taking differently: Take 12.5 mg by mouth daily.  03/24/16  Yes [name], Peter M, MD  metoprolol succinate (TOPROL-XL) 100 MG 24 hr tablet TAKE 1 TABLET EVERY DAY Patient taking differently: Take 100 mg by mouth daily.  09/01/17  Yes [name], Peter M, MD  Multiple Vitamins-Minerals (MULTIVITAMINS THER. W/MINERALS) TABS Take 1 tablet by mouth daily.     Yes [provider]  nitroGLYCERIN (NITROSTAT) 0.4 MG SL tablet Place 1 tablet (0.4 mg total) under the tongue every 5 (five) minutes as needed for chest pain. 05/07/14  Yes [name], Peter M, MD  PROAIR HFA 108 [phone] BASE) MCG/ACT inhaler Inhale 2 puffs into  the lungs every 4 (four) hours as needed for shortness of breath.  10/22/13  Yes [provider]  ramipril (ALTACE) 10 MG capsule TAKE 1 CAPSULE EVERY DAY Patient taking differently: Take 10 mg by mouth daily.  04/17/18  Yes [name], Peter M, MD  spironolactone (ALDACTONE) 25 MG tablet Take 1 tablet (25 mg total) by mouth daily. 05/01/18  Yes [name], Peter M, MD  temazepam (RESTORIL) 7.5 MG capsule Take 1 capsule (7.5 mg total) by mouth at bedtime as needed for sleep. 10/17/13  Yes Barton Dubois, MD  vitamin B-12 (CYANOCOBALAMIN) 1000 MCG tablet Take 1,000 mcg by mouth daily.   Yes [provider]  BESIVANCE 0.6 % SUSP Place 1 drop into the right eye 3 (three)  times daily. 08/23/18   [provider]  DUREZOL 0.05 % EMUL Place 1 drop into the right eye 3 (three) times daily. 08/23/18   [provider]    Current Facility-Administered Medications  Medication Dose Route Frequency Provider Last Rate Last Dose  . albuterol (PROVENTIL) (2.5 MG/3ML) 0.083% nebulizer solution 3 mL  3 mL Inhalation Q4H PRN Georgette Shell, MD      . Difluprednate 0.05 % EMUL 1 drop  1 drop Right Eye TID Georgette Shell, MD      . digoxin Fonnie Birkenhead) tablet 0.0625 mg  0.0625 mg Oral Daily Georgette Shell, MD   0.0625 mg at 09/21/18 1400  . ferumoxytol (FERAHEME) 510 mg in sodium chloride 0.9 % 100 mL IVPB  510 mg Intravenous Once Georgette Shell, MD 468 mL/hr at 09/21/18 1412 510 mg at 09/21/18 1412  . [START ON 09/22/2018] Influenza vac split quadrivalent PF (FLUZONE HIGH-DOSE) injection 0.5 mL  0.5 mL Intramuscular Tomorrow-1000 Lacretia Leigh, MD      . metoprolol tartrate (LOPRESSOR) tablet 12.5 mg  12.5 mg Oral Daily Georgette Shell, MD   12.5 mg at 09/21/18 1400    Allergies as of 09/21/2018 - Review Complete 09/21/2018  Allergen Reaction Noted  . Aspirin  10/14/2011  . Iron  03/04/2013  . Lescol [fluvastatin sodium]  03/04/2013  . Pentazocine lactate   06/30/2011  . Vytorin [ezetimibe-simvastatin]  03/04/2013    Family History  Problem Relation Age of Onset  . Colon cancer Father   . Diabetes Sister   . Colon polyps Brother   . Pancreatic cancer Brother 71    Social History   Socioeconomic History  . Marital status: Married    Spouse name: Not on file  . Number of children: 2  . Years of education: Not on file  . Highest education level: Not on file  Occupational History  . Occupation: Retired     Fish farm manager: RETIRED  Social Needs  . Financial resource strain: Not on file  . Food insecurity:    Worry: Not on file    Inability: Not on file  . Transportation needs:    Medical: Not on file    Non-medical: Not on file  Tobacco Use  . Smoking status: Former Smoker    Types: Cigars  . Smokeless tobacco: Never Used  Substance and Sexual Activity  . Alcohol use: No  . Drug use: No  . Sexual activity: Not Currently  Lifestyle  . Physical activity:    Days per week: Not on file    Minutes per session: Not on file  . Stress: Not on file  Relationships  . Social connections:    Talks on phone: Not on file    Gets together: Not on file    Attends religious service: Not on file    Active member of club or organization: Not on file    Attends meetings of clubs or organizations: Not on file    Relationship status: Not on file  . Intimate partner violence:    Fear of current or ex partner: Not on file    Emotionally abused: Not on file    Physically abused: Not on file    Forced sexual activity: Not on file  Other Topics Concern  . Not on file  Social History Narrative   Lives in [place] with spouse.  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  Physical Exam: Vital signs in last 24 hours: Temp:  [97.3 F (36.3 C)-98.1 F (36.7 C)] 98.1 F (36.7 C)  (10/25 1202) Pulse Rate:  [62-101] 62 (10/25 1202) Resp:  [15-16] 16 (10/25 1202) BP: (106-116)/(73-80) 115/80 (10/25 1202) SpO2:  [98 %-100 %] 98 % (10/25 1202) Last BM Date: 09/21/18 General:   Alert, thin white male in NAD Psych:  Pleasant, cooperative. Normal mood and affect. Eyes:  Pupils equal, sclera clear, no icterus.   Conjunctiva pink. Ears:  Normal auditory acuity. Nose:  No deformity, discharge,  or lesions. Neck:  Supple; no masses Lungs:  Clear throughout to auscultation.   No wheezes, crackles, or rhonchi.  Heart:  Regular rate and rhythm, no edema Abdomen:  Soft, non-distended, nontender, BS active, no palp mass    Rectal:  No external hemorrhoids. No obvious fissures. Msk:  Symmetrical without gross deformities. . Neurologic:  Alert and  oriented x4;  grossly normal neurologically. Skin:  Intact without significant lesions or rashes..   Intake/Output from previous day: No intake/output data recorded. Intake/Output this shift: No intake/output data recorded.  Lab Results: Recent Labs    09/21/18 0731  WBC 9.9  HGB 11.0*  HCT 36.5*  PLT 230   BMET Recent Labs    09/21/18 0731  NA 139  K 4.1  CL 107  CO2 24  GLUCOSE 101*  BUN 26*  CREATININE 1.13  CALCIUM 8.9   LFT Recent Labs    09/21/18 0731  PROT 6.6  ALBUMIN 3.6  AST 28  ALT 18  ALKPHOS 38  BILITOT 0.9     Tye Savoy, NP-C @  09/21/2018, 2:23 PM

## 2018-09-21 NOTE — Consult Note (Addendum)
Referring Provider: [facility]   Primary Care Physician:  Crist Infante, MD Primary Gastroenterologist:   [name], MD  Reason for Consultation:  Lower GI bleed    ASSESSMENT AND PLAN:    82. 84 yo male with remote hx of colon cancer x2.  Admitted with recurrent painless rectal bleeding x 1.5 weeks. This is second admission for (low volume) bleeding since early August.  -I think it is reasonable to proceed with a colonoscopy / or at flexible sigmoidoscopy at this point. Patient is in agreement. I spoke with daughter who is also on board.  -Will put on clear liquids in case we decide to proceed tomorrow.   2. Mild [place] anemia. Hgb 11 and stable despite rectal bleeding.   3. Chronic AFIB, not anti-coagulation candidate due to hx of GI bleed  4.  Hx of CAD / remote CABG / chronic systolic heart failure / ischemic cardiomyopathy / chronic afib / ICD placement    HPI: [name] [name] is an 84 y.o. male with CAD / remote CABG, systolic heart failure, ischemic cardiomyopathy , chronic afib, ICD placement and hx of rectal cancer x2 (last one in 2000). He is s/p right hemicolectomy and ? Sigmoid resection. He has a hx of ileocolonic anastomotic stricture, s/p dilation a few years ago. No evidence for recurrent cancer on last colonoscopy which appears to have been in 2009.   Patient was seen in 2014 to discuss surveillance colonoscopy. In the absence of alarm symptoms he decided to forgo colonoscopy given advanced age  Patient was hospitalized early August with GI bleeding. Hgb declined from 13 .8 in 2017 to 10.4. CT scan suggested ? Rectal wall thickening. Patient refused colonoscopy.  History since hospital discharge early August 2019:   Rectal bleeding stopped following recent admission. Then ~ 1.5 weeks ago patient began having recurrent painless rectal bleeding with bowel movements.  Bleeding intermittent . Blood is bright red and on the toilet tissue as well as in the toilet bowl  at times.  Patient denies constipation/straining.  He returned to the ED this a.m..  Hemoglobin 11, actually up from 10.7 late last month.   Patient describes chronic loose stool.  He describes lack of "storage" since bowel surgeries.  He has problems with leakage but per wife, he has recently been incontinent of bowel movements a few occasions.   I ran into the patient's daughter in the hallway, she requested to talk in private.  Patient recently prescribed Lomotil to take 2-4 times a day as needed.  Patient has been taking a couple in the morning, couple at lunch and may be a couple of Lomotil in the evening.  She has seen some mental status changes and wonders if related to Lomotil.  Mental clarity seems to be better over the last couple days since patient has not been taking large amounts of Lomotil  Daughter also reports some occasional shortness of breath at home but this patient saw cardiology late last month and Lasix was increased.    Past Medical History:  Diagnosis Date  . CAD (coronary artery disease)    Old inferolateral MI s/p CABG in 2002  . CAP (community acquired pneumonia) 10/03/2011  . Cardiomyopathy    s/p ICD 09/30/08; EF 35 to 40% per echo in October 2021  . Chronic systolic CHF (congestive heart failure) ([facility] [facility]) 07/24/2013  . Colon cancer ([facility])    s/p chemo, RT, sugery  . Colorectal anastomotic stricture   . Diverticulosis   .  ED (erectile dysfunction)   . GERD (gastroesophageal reflux disease)    rarely  . HTN (hypertension)   . ICD (implantable cardiac defibrillator) discharge October 2012   ICD shock for afib with RVR  . Iron deficiency anemia   . Paroxysmal atrial fibrillation ([facility])    controlled with amiodarone, not a coumadin candidate due to GI bleeding    Past Surgical History:  Procedure Laterality Date  . APPENDECTOMY    . BACK SURGERY    . CARDIAC CATHETERIZATION  11/07/2000   EF 32%  . CARDIAC DEFIBRILLATOR PLACEMENT  09/30/08   MDT dual chamber  ICD placed by Dr Rayann Heman  . CARDIOVASCULAR STRESS TEST  08/06/2008   EF 30%  . CHOLECYSTECTOMY    . COLON RESECTION     x2  . CORONARY ARTERY BYPASS GRAFT    . EP IMPLANTABLE DEVICE N/A 10/15/2015   Procedure:  PPM Generator Changeout;  Surgeon: [name], MD;  Location: [facility];  Service: Cardiovascular;  Laterality: N/A;  . HERNIA REPAIR    . US ECHOCARDIOGRAPHY  08/11/2008   EF 30-35%  . VASECTOMY      Prior to Admission medications   Medication Sig Start Date End Date Taking? Authorizing Provider  cholecalciferol (VITAMIN D) 1000 UNITS tablet Take 1,000 Units by mouth daily.    Yes [provider]  digoxin (LANOXIN) 0.125 MG tablet TAKE 1/2 TABLET EVERY DAY Patient taking differently: Take 0.0625 mg by mouth daily.  07/25/18  Yes [name], Peter M, MD  diphenoxylate-atropine (LOMOTIL) 2.5-0.025 MG per tablet Take 2 tablets by mouth 2 (two) times daily as needed for diarrhea or loose stools. For diarrhea    Yes [provider]  furosemide (LASIX) 40 MG tablet Take 40 mg daily 5 days a week and 20 mg on Sat and Sun Patient taking differently: Take 20-40 mg by mouth daily. Take 40 mg daily Monday-Friday and 20 mg on Saturday & Sunday 08/27/18  Yes [name], Peter M, MD  metoprolol (LOPRESSOR) 50 MG tablet Take 0.5 tablets (25 mg total) by mouth daily. Patient taking differently: Take 12.5 mg by mouth daily.  03/24/16  Yes [name], Peter M, MD  metoprolol succinate (TOPROL-XL) 100 MG 24 hr tablet TAKE 1 TABLET EVERY DAY Patient taking differently: Take 100 mg by mouth daily.  09/01/17  Yes [name], Peter M, MD  Multiple Vitamins-Minerals (MULTIVITAMINS THER. W/MINERALS) TABS Take 1 tablet by mouth daily.     Yes [provider]  nitroGLYCERIN (NITROSTAT) 0.4 MG SL tablet Place 1 tablet (0.4 mg total) under the tongue every 5 (five) minutes as needed for chest pain. 05/07/14  Yes [name], Peter M, MD  PROAIR HFA 108 [phone] BASE) MCG/ACT inhaler Inhale 2 puffs into  the lungs every 4 (four) hours as needed for shortness of breath.  10/22/13  Yes [provider]  ramipril (ALTACE) 10 MG capsule TAKE 1 CAPSULE EVERY DAY Patient taking differently: Take 10 mg by mouth daily.  04/17/18  Yes [name], Peter M, MD  spironolactone (ALDACTONE) 25 MG tablet Take 1 tablet (25 mg total) by mouth daily. 05/01/18  Yes [name], Peter M, MD  temazepam (RESTORIL) 7.5 MG capsule Take 1 capsule (7.5 mg total) by mouth at bedtime as needed for sleep. 10/17/13  Yes Barton Dubois, MD  vitamin B-12 (CYANOCOBALAMIN) 1000 MCG tablet Take 1,000 mcg by mouth daily.   Yes [provider]  BESIVANCE 0.6 % SUSP Place 1 drop into the right eye 3 (three)  times daily. 08/23/18   [provider]  DUREZOL 0.05 % EMUL Place 1 drop into the right eye 3 (three) times daily. 08/23/18   [provider]    Current Facility-Administered Medications  Medication Dose Route Frequency Provider Last Rate Last Dose  . albuterol (PROVENTIL) (2.5 MG/3ML) 0.083% nebulizer solution 3 mL  3 mL Inhalation Q4H PRN Georgette Shell, MD      . Difluprednate 0.05 % EMUL 1 drop  1 drop Right Eye TID Georgette Shell, MD      . digoxin Fonnie Birkenhead) tablet 0.0625 mg  0.0625 mg Oral Daily Georgette Shell, MD   0.0625 mg at 09/21/18 1400  . ferumoxytol (FERAHEME) 510 mg in sodium chloride 0.9 % 100 mL IVPB  510 mg Intravenous Once Georgette Shell, MD 468 mL/hr at 09/21/18 1412 510 mg at 09/21/18 1412  . [START ON 09/22/2018] Influenza vac split quadrivalent PF (FLUZONE HIGH-DOSE) injection 0.5 mL  0.5 mL Intramuscular Tomorrow-1000 Lacretia Leigh, MD      . metoprolol tartrate (LOPRESSOR) tablet 12.5 mg  12.5 mg Oral Daily Georgette Shell, MD   12.5 mg at 09/21/18 1400    Allergies as of 09/21/2018 - Review Complete 09/21/2018  Allergen Reaction Noted  . Aspirin  10/14/2011  . Iron  03/04/2013  . Lescol [fluvastatin sodium]  03/04/2013  . Pentazocine lactate   06/30/2011  . Vytorin [ezetimibe-simvastatin]  03/04/2013    Family History  Problem Relation Age of Onset  . Colon cancer Father   . Diabetes Sister   . Colon polyps Brother   . Pancreatic cancer Brother 47    Social History   Socioeconomic History  . Marital status: Married    Spouse name: Not on file  . Number of children: 2  . Years of education: Not on file  . Highest education level: Not on file  Occupational History  . Occupation: Retired     Fish farm manager: RETIRED  Social Needs  . Financial resource strain: Not on file  . Food insecurity:    Worry: Not on file    Inability: Not on file  . Transportation needs:    Medical: Not on file    Non-medical: Not on file  Tobacco Use  . Smoking status: Former Smoker    Types: Cigars  . Smokeless tobacco: Never Used  Substance and Sexual Activity  . Alcohol use: No  . Drug use: No  . Sexual activity: Not Currently  Lifestyle  . Physical activity:    Days per week: Not on file    Minutes per session: Not on file  . Stress: Not on file  Relationships  . Social connections:    Talks on phone: Not on file    Gets together: Not on file    Attends religious service: Not on file    Active member of club or organization: Not on file    Attends meetings of clubs or organizations: Not on file    Relationship status: Not on file  . Intimate partner violence:    Fear of current or ex partner: Not on file    Emotionally abused: Not on file    Physically abused: Not on file    Forced sexual activity: Not on file  Other Topics Concern  . Not on file  Social History Narrative   Lives in [place] with spouse.  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  Physical Exam: Vital signs in last 24 hours: Temp:  [97.3 F (36.3 C)-98.1 F (36.7 C)] 98.1 F (36.7 C)  (10/25 1202) Pulse Rate:  [62-101] 62 (10/25 1202) Resp:  [15-16] 16 (10/25 1202) BP: (106-116)/(73-80) 115/80 (10/25 1202) SpO2:  [98 %-100 %] 98 % (10/25 1202) Last BM Date: 09/21/18 General:   Alert, thin white male in NAD Psych:  Pleasant, cooperative. Normal mood and affect. Eyes:  Pupils equal, sclera clear, no icterus.   Conjunctiva pink. Ears:  Normal auditory acuity. Nose:  No deformity, discharge,  or lesions. Neck:  Supple; no masses Lungs:  Clear throughout to auscultation.   No wheezes, crackles, or rhonchi.  Heart:  Regular rate and rhythm, no edema Abdomen:  Soft, non-distended, nontender, BS active, no palp mass    Rectal:  No external hemorrhoids. No obvious fissures. Msk:  Symmetrical without gross deformities. . Neurologic:  Alert and  oriented x4;  grossly normal neurologically. Skin:  Intact without significant lesions or rashes..   Intake/Output from previous day: No intake/output data recorded. Intake/Output this shift: No intake/output data recorded.  Lab Results: Recent Labs    09/21/18 0731  WBC 9.9  HGB 11.0*  HCT 36.5*  PLT 230   BMET Recent Labs    09/21/18 0731  NA 139  K 4.1  CL 107  CO2 24  GLUCOSE 101*  BUN 26*  CREATININE 1.13  CALCIUM 8.9   LFT Recent Labs    09/21/18 0731  PROT 6.6  ALBUMIN 3.6  AST 28  ALT 18  ALKPHOS 38  BILITOT 0.9     Tye Savoy, NP-C @  09/21/2018, 2:23 PM

## 2018-09-21 NOTE — H&P (Signed)
History and Physical    [name] [name] TML:[id number] DOB: 1932-07-16 DOA: 09/21/2018  PCP: Crist Infante, MD  Patient coming from: Home    Chief Complaint: Bleeding per rectum HPI: [name] [name] is a 84 y.o. male with medical history significant of CABG in 6812 systolic heart failure, history of rectal cancer received radiation and chemotherapy and chronic atrial fibrillation diverticulosis, history of colon cancer recent hospital admission 06/29/2018 for the same now readmitted with bright red bleeding per rectum along with some dark blood but not melena.  He did have some abdominal cramping and nausea but no vomiting.  He is not on a blood thinner.  He has a history of atrial fibrillation.  Daughter reports he takes a lot of Imodium up to 4 tablets daily for a long time.  He denies any chest pain shortness of breath fever chills cough hematuria.  Patient is admitted 8 of 2019 with a similar complaints has was seen by GI recommended no intervention and was discharged home. ED Course: He remained hemodynamically stable with a hemoglobin of 11.0 white count 9.9 platelet count 230 he had no further bleed since coming to the ER.  Review of Systems see HPI   Past Medical History:  Diagnosis Date  . CAD (coronary artery disease)    Old inferolateral MI s/p CABG in 2002  . CAP (community acquired pneumonia) 10/03/2011  . Cardiomyopathy    s/p ICD 09/30/08; EF 35 to 40% per echo in October 2021  . Chronic systolic CHF (congestive heart failure) ([place]) 07/24/2013  . Colon cancer ([place])    s/p chemo, RT, sugery  . Colorectal anastomotic stricture   . Diverticulosis   . ED (erectile dysfunction)   . GERD (gastroesophageal reflux disease)    rarely  . HTN (hypertension)   . ICD (implantable cardiac defibrillator) discharge October 2012   ICD shock for afib with RVR  . Iron deficiency anemia   . Paroxysmal atrial fibrillation (HCC)    controlled with amiodarone, not a coumadin candidate due to GI  bleeding    Past Surgical History:  Procedure Laterality Date  . APPENDECTOMY    . BACK SURGERY    . CARDIAC CATHETERIZATION  11/07/2000   EF 32%  . CARDIAC DEFIBRILLATOR PLACEMENT  09/30/08   MDT dual chamber ICD placed by Dr Rayann Heman  . CARDIOVASCULAR STRESS TEST  08/06/2008   EF 30%  . CHOLECYSTECTOMY    . COLON RESECTION     x2  . CORONARY ARTERY BYPASS GRAFT    . EP IMPLANTABLE DEVICE N/A 10/15/2015   Procedure:  PPM Generator Changeout;  Surgeon: [name], MD;  Location: [facility];  Service: Cardiovascular;  Laterality: N/A;  . HERNIA REPAIR    . US ECHOCARDIOGRAPHY  08/11/2008   EF 30-35%  . VASECTOMY       reports that he has quit smoking. His smoking use included cigars. He has never used smokeless tobacco. He reports that he does not drink alcohol or use drugs.  Allergies  Allergen Reactions  . Aspirin     Bleeding   . Iron     Stomach issues  . Lescol [Fluvastatin Sodium]     Dizziness  . Pentazocine Lactate     unknown  . Vytorin [Ezetimibe-Simvastatin]     Leg cramps     Family History  Problem Relation Age of Onset  . Colon cancer Father   . Diabetes Sister   . Colon polyps Brother   .  Pancreatic cancer Brother 56   Unacceptable: Noncontributory, unremarkable, or negative. Acceptable: Family history reviewed and not pertinent (If you reviewed it)  Prior to Admission medications   Medication Sig Start Date End Date Taking? Authorizing Provider  metoprolol (LOPRESSOR) 50 MG tablet Take 0.5 tablets (25 mg total) by mouth daily. Patient taking differently: Take 12.5 mg by mouth daily.  03/24/16  Yes [name], Peter M, MD  metoprolol succinate (TOPROL-XL) 100 MG 24 hr tablet TAKE 1 TABLET EVERY DAY 09/01/17  Yes [name], Peter M, MD  BESIVANCE 0.6 % SUSP Place 1 drop into the right eye 3 (three) times daily. 08/23/18   [provider]  cholecalciferol (VITAMIN D) 1000 UNITS tablet Take 1,000 Units by mouth daily.     [provider]  digoxin (LANOXIN) 0.125 MG tablet TAKE 1/2 TABLET EVERY DAY 07/25/18   [name], Peter M, MD  diphenoxylate-atropine (LOMOTIL) 2.5-0.025 MG per tablet Take 2 tablets by mouth 2 (two) times daily as needed for diarrhea or loose stools. For diarrhea     [provider]  DUREZOL 0.05 % EMUL Place 1 drop into the right eye 3 (three) times daily. 08/23/18   [provider]  furosemide (LASIX) 40 MG tablet Take 40 mg daily 5 days a week and 20 mg on Sat and Sun 08/27/18   [name], Peter M, MD  Multiple Vitamins-Minerals (MULTIVITAMINS THER. W/MINERALS) TABS Take 1 tablet by mouth daily.      [provider]  nitroGLYCERIN (NITROSTAT) 0.4 MG SL tablet Place 1 tablet (0.4 mg total) under the tongue every 5 (five) minutes as needed for chest pain. 05/07/14   [name], Peter M, MD  PROAIR HFA 108 (90 BASE) MCG/ACT inhaler Inhale 2 puffs into the lungs every 4 (four) hours as needed for shortness of breath.  10/22/13   [provider]  ramipril (ALTACE) 10 MG capsule TAKE 1 CAPSULE EVERY DAY 04/17/18   [name], Peter M, MD  spironolactone (ALDACTONE) 25 MG tablet Take 1 tablet (25 mg total) by mouth daily. 05/01/18   [name], Peter M, MD  temazepam (RESTORIL) 7.5 MG capsule Take 1 capsule (7.5 mg total) by mouth at bedtime as needed for sleep. 10/17/13   Barton Dubois, MD  vitamin B-12 (CYANOCOBALAMIN) 1000 MCG tablet Take 1,000 mcg by mouth daily.    [provider]    Physical Exam: Vitals:   09/21/18 0705 09/21/18 0753  BP: 106/78 116/73  Pulse: (!) 101 93  Resp:  15  Temp:  (!) 97.3 F (36.3 C)  TempSrc:  Oral  SpO2: 98% 100%    Constitutional: NAD, calm, comfortable Vitals:   09/21/18 0705 09/21/18 0753  BP: 106/78 116/73  Pulse: (!) 101 93  Resp:  15  Temp:  (!) 97.3 F (36.3 C)  TempSrc:  Oral  SpO2: 98% 100%   Eyes: PERRL, lids and conjunctivae normal ENMT: Mucous membranes are moist. Posterior pharynx clear of any exudate or lesions.Normal  dentition.  Neck: normal, supple, no masses, no thyromegaly Respiratory: clear to auscultation bilaterally, no wheezing, no crackles. Normal respiratory effort. No accessory muscle use.  Cardiovascular: Regular rate and rhythm, no murmurs / rubs / gallops. No extremity edema. 2+ pedal pulses. No carotid bruits.  Abdomen: no tenderness, no masses palpated. No hepatosplenomegaly. Bowel sounds positive.  Musculoskeletal: no clubbing / cyanosis. No joint deformity upper and lower extremities. Good ROM, no contractures. Normal muscle tone.  Skin: no rashes, lesions, ulcers. No induration Neurologic: CN 2-12 grossly intact.  Sensation intact, DTR normal. Strength 5/5 in all 4.  Psychiatric: Normal judgment and insight. Alert and oriented x 3. Normal mood.   (Anything < 9 systems with 2 bullets each down codes to level 1) (If patient refuses exam can't bill higher level) (Make sure to document decubitus ulcers present on admission -- if possible -- and whether patient has chronic indwelling catheter at time of admission)  Labs on Admission: I have personally reviewed following labs and imaging studies  CBC: Recent Labs  Lab 09/21/18 0731  WBC 9.9  HGB 11.0*  HCT 36.5*  MCV 97.1  PLT 562   Basic Metabolic Panel: Recent Labs  Lab 09/21/18 0731  NA 139  K 4.1  CL 107  CO2 24  GLUCOSE 101*  BUN 26*  CREATININE 1.13  CALCIUM 8.9   GFR: CrCl cannot be calculated (Unknown ideal weight.). Liver Function Tests: Recent Labs  Lab 09/21/18 0731  AST 28  ALT 18  ALKPHOS 38  BILITOT 0.9  PROT 6.6  ALBUMIN 3.6   No results for input(s): LIPASE, AMYLASE in the last 168 hours. No results for input(s): AMMONIA in the last 168 hours. Coagulation Profile: No results for input(s): INR, PROTIME in the last 168 hours. Cardiac Enzymes: No results for input(s): CKTOTAL, CKMB, CKMBINDEX, TROPONINI in the last 168 hours. BNP (last 3 results) No results for input(s): PROBNP in the last 8760  hours. HbA1C: No results for input(s): HGBA1C in the last 72 hours. CBG: No results for input(s): GLUCAP in the last 168 hours. Lipid Profile: No results for input(s): CHOL, HDL, LDLCALC, TRIG, CHOLHDL, LDLDIRECT in the last 72 hours. Thyroid Function Tests: No results for input(s): TSH, T4TOTAL, FREET4, T3FREE, THYROIDAB in the last 72 hours. Anemia Panel: No results for input(s): VITAMINB12, FOLATE, FERRITIN, TIBC, IRON, RETICCTPCT in the last 72 hours. Urine analysis:    Component Value Date/Time   COLORURINE YELLOW 10/14/2013 1510   APPEARANCEUR CLEAR 10/14/2013 1510   LABSPEC 1.017 10/14/2013 1510   PHURINE 6.0 10/14/2013 1510   GLUCOSEU NEGATIVE 10/14/2013 1510   HGBUR NEGATIVE 10/14/2013 1510   BILIRUBINUR NEGATIVE 10/14/2013 1510   KETONESUR NEGATIVE 10/14/2013 1510   PROTEINUR 30 (A) 10/14/2013 1510   UROBILINOGEN 1.0 10/14/2013 1510   NITRITE NEGATIVE 10/14/2013 1510   LEUKOCYTESUR NEGATIVE 10/14/2013 1510    Radiological Exams on Admission: No results found.  Assessment/Plan Active Problems:   GI bleed #1 lower GI bleed-patient with history of diverticulosis, hemoglobin 11 stable no further GI bleed since coming to the ER.  Repeat hemoglobin in the morning called consult to GI with labouer patient is a patient of Dr. Carlean Purl.  #2 chronic atrial fibrillation rate controlled not on anticoagulation due to GI bleed  #3 history of ischemic cardiomyopathy and CABG systolic CHF stable at this time-decrease the dose of beta-blocker since his blood pressure is soft he takes Lasix ACE inhibitor and beta-blocker at home I will hold Lasix and ACE inhibitor since his blood pressure is already soft.  I will continue beta-blocker in view of his atrial fibrillation for rate control   #4 history of hypertension blood pressure soft monitor DVT prophylaxis SCD due to GI bleed Code Status: Full code Family Communication: Discussed with wife son and daughter in the room Disposition  Plan: Pending clinical improvement Consults called: GI Admission status: Observation   Georgette Shell MD [facility]  If 7PM-7AM, please contact night-coverage [email or web address] Password [email or web address]  09/21/2018, 10:55 AM

## 2018-09-21 NOTE — Anesthesia Preprocedure Evaluation (Addendum)
Anesthesia Evaluation  Patient identified by MRN, date of birth, ID band Patient awake    Reviewed: Allergy & Precautions, NPO status , Patient's Chart, lab work & pertinent test results, reviewed documented beta blocker date and time   Airway Mallampati: II  TM Distance: >3 FB Neck ROM: Full    Dental  (+) Poor Dentition, Teeth Intact   Pulmonary pneumonia, resolved, former smoker,    Pulmonary exam normal breath sounds clear to auscultation       Cardiovascular hypertension, Pt. on medications and Pt. on home beta blockers + angina with exertion + CAD, + CABG and +CHF  Normal cardiovascular exam+ dysrhythmias Atrial Fibrillation + Cardiac Defibrillator  Rhythm:Irregular Rate:Tachycardia  EKG 06/2018- Atrial Fibrillation with non specific IVCD, non specific St-T wave abnormalities  Echo 01/04/2016- Left ventricle: Inferior wall hypokinesis The cavity size was   moderately dilated. Wall thickness was increased in a pattern of mild LVH. Systolic function was mildly to moderately reduced. The estimated ejection fraction was in the range of 40% to 45%. - Aortic valve: There was mild regurgitation. - Mitral valve: There was moderate regurgitation. - Left atrium: The atrium was moderately dilated. - Right ventricle: The cavity size was mildly dilated. - Right atrium: The atrium was mildly dilated. - Atrial septum: No defect or patent foramen ovale was identified. - Pulmonary arteries: PA peak pressure: 71 mm Hg (S).  MI 2002 - Inferolateral  CABG 4v 2002- LIMA to LAD, SVG to PDA, D1, L Cx Chronic Atrial Fibrillation    Neuro/Psych negative neurological ROS  negative psych ROS   GI/Hepatic Neg liver ROS, PUD, GERD  Medicated and Controlled,Hx/o Colon Ca with Colorectal anastamosis S/P ChemoRx and RT Rectal bleeding   Endo/Other  Hyperlipidemia  Renal/GU negative Renal ROS  negative genitourinary    Musculoskeletal negative musculoskeletal ROS (+)   Abdominal (+)  Abdomen: soft.    Peds  Hematology  (+) anemia ,   Anesthesia Other Findings   Reproductive/Obstetrics                          Anesthesia Physical Anesthesia Plan  ASA: III and emergent  Anesthesia Plan: MAC   Post-op Pain Management:    Induction: Intravenous  PONV Risk Score and Plan: 1 and Ondansetron, Treatment may vary due to age or medical condition and Propofol infusion  Airway Management Planned: Natural Airway and Simple Face Mask  Additional Equipment:   Intra-op Plan:   Post-operative Plan:   Informed Consent: I have reviewed the patients History and Physical, chart, labs and discussed the procedure including the risks, benefits and alternatives for the proposed anesthesia with the patient or authorized representative who has indicated his/her understanding and acceptance.     Plan Discussed with: CRNA and Surgeon  Anesthesia Plan Comments:       Anesthesia Quick Evaluation

## 2018-09-21 NOTE — ED Notes (Signed)
Pt had blood in brief and buttocks upon rectal exam.

## 2018-09-21 NOTE — ED Triage Notes (Signed)
Pt from home with c/o rectal bleeding with bowel movements x 1 week. Pt states he had this issue over the summer as well. Pt states yesterday symptoms got worse. Bleeding is still only with BM. Pt states he felt lightheaded as he was coming to the ED.

## 2018-09-21 NOTE — ED Notes (Signed)
ED TO INPATIENT HANDOFF REPORT  Name/Age/Gender [name] [name] 84 y.o. male  Code Status    Code Status Orders  (From admission, onward)         Start     Ordered   09/21/18 1058  Full code  Continuous     09/21/18 1057        Code Status History    Date Active Date Inactive Code Status Order ID Comments User Context   07/01/2018 1858 07/03/2018 1608 Full Code [id number]  Charlynne Cousins, MD ED   10/15/2015 1521 10/15/2015 2014 Full Code [id number]  [name], MD Inpatient   10/14/2013 1751 10/17/2013 1623 Full Code [id number]  Elmarie Shiley, MD Inpatient   10/02/2011 1826 10/06/2011 1716 Full Code [id number]  Valente David, RN Inpatient      Home/SNF/Other Home  Chief Complaint rectal bleeding  Level of Care/Admitting Diagnosis ED Disposition    ED Disposition Condition Comment   [facility] [[id number]]  Level of Care: Telemetry [5]  Admit to tele based on following criteria: Monitor for Ischemic changes  Diagnosis: GI bleed [131438]  Admitting Physician: Georgette Shell [[id number]]  Attending Physician: Georgette Shell [[id number]]  PT Class (Do Not Modify): Observation [104]  PT Acc Code (Do Not Modify): Observation [10022]       Medical History Past Medical History:  Diagnosis Date  . CAD (coronary artery disease)    Old inferolateral MI s/p CABG in 2002  . CAP (community acquired pneumonia) 10/03/2011  . Cardiomyopathy    s/p ICD 09/30/08; EF 35 to 40% per echo in October 2021  . Chronic systolic CHF (congestive heart failure) ([place]) 07/24/2013  . Colon cancer ([place])    s/p chemo, RT, sugery  . Colorectal anastomotic stricture   . Diverticulosis   . ED (erectile dysfunction)   . GERD (gastroesophageal reflux disease)    rarely  . HTN (hypertension)   . ICD (implantable cardiac defibrillator) discharge October 2012   ICD shock for afib with RVR  . Iron deficiency anemia   . Paroxysmal atrial fibrillation  (HCC)    controlled with amiodarone, not a coumadin candidate due to GI bleeding    Allergies Allergies  Allergen Reactions  . Aspirin     Bleeding   . Iron     Stomach issues  . Lescol [Fluvastatin Sodium]     Dizziness  . Pentazocine Lactate     unknown  . Vytorin [Ezetimibe-Simvastatin]     Leg cramps     IV Location/Drains/Wounds Patient Lines/Drains/Airways Status   Active Line/Drains/Airways    Name:   Placement date:   Placement time:   Site:   Days:   Peripheral IV 09/21/18 Left Antecubital   09/21/18    0738    Antecubital   less than 1          Labs/Imaging Results for orders placed or performed during the hospital encounter of 09/21/18 (from the past 48 hour(s))  Comprehensive metabolic panel     Status: Abnormal   Collection Time: 09/21/18  7:31 AM  Result Value Ref Range   Sodium 139 135 - 145 mmol/L   Potassium 4.1 3.5 - 5.1 mmol/L   Chloride 107 98 - 111 mmol/L   CO2 24 22 - 32 mmol/L   Glucose, Bld 101 (H) 70 - 99 mg/dL   BUN 26 (H) 8 - 23 mg/dL   Creatinine, Ser 1.13 0.61 - 1.24 mg/dL  Calcium 8.9 8.9 - 10.3 mg/dL   Total Protein 6.6 6.5 - 8.1 g/dL   Albumin 3.6 3.5 - 5.0 g/dL   AST 28 15 - 41 U/L   ALT 18 0 - 44 U/L   Alkaline Phosphatase 38 38 - 126 U/L   Total Bilirubin 0.9 0.3 - 1.2 mg/dL   GFR calc non Af Amer 58 (L) >60 mL/min   GFR calc Af Amer >60 >60 mL/min    Comment: (NOTE) The eGFR has been calculated using the CKD EPI equation. This calculation has not been validated in all clinical situations. eGFR's persistently <60 mL/min signify possible Chronic Kidney Disease.    Anion gap 8 5 - 15    Comment: Performed at [facility], [place] [address], [place], [place] [address]  CBC     Status: Abnormal   Collection Time: 09/21/18  7:31 AM  Result Value Ref Range   WBC 9.9 4.0 - 10.5 K/uL   RBC 3.76 (L) 4.22 - 5.81 MIL/uL   Hemoglobin 11.0 (L) 13.0 - 17.0 g/dL   HCT 36.5 (L) 39.0 - 52.0 %   MCV 97.1 80.0 - 100.0  fL   MCH 29.3 26.0 - 34.0 pg   MCHC 30.1 30.0 - 36.0 g/dL   RDW 14.3 11.5 - 15.5 %   Platelets 230 150 - 400 K/uL   nRBC 0.0 0.0 - 0.2 %    Comment: Performed at [facility], [place] [address], [place], [place] [address]  Type and screen [place]     Status: None   Collection Time: 09/21/18  7:31 AM  Result Value Ref Range   ABO/RH(D) O POS    Antibody Screen NEG    Sample Expiration      09/24/2018 Performed at [facility], [place] [address] [place], [place] [address]   Digoxin level     Status: Abnormal   Collection Time: 09/21/18  7:33 AM  Result Value Ref Range   Digoxin Level 0.4 (L) 0.8 - 2.0 ng/mL    Comment: Performed at [facility], [place] [address] [place], [place], [place] [address]   No results found. None  Pending Labs Unresulted Labs (From admission, onward)    Start     Ordered   09/22/18 0500  CBC  Tomorrow morning,   R     09/21/18 1120   09/22/18 5465  Basic metabolic panel  Tomorrow morning,   R     09/21/18 1120          Vitals/Pain Today's Vitals   09/21/18 0705 09/21/18 0706 09/21/18 0753  BP: 106/78  116/73  Pulse: (!) 101  93  Resp:   15  Temp:   (!) 97.3 F (36.3 C)  TempSrc:   Oral  SpO2: 98%  100%  PainSc:  0-No pain     Isolation Precautions No active isolations  Medications Medications  Influenza vac split quadrivalent PF (FLUZONE HIGH-DOSE) injection 0.5 mL (has no administration in time range)  digoxin (LANOXIN) tablet 0.0625 mg (has no administration in time range)  Difluprednate 0.05 % EMUL 1 drop (has no administration in time range)  albuterol (PROVENTIL HFA;VENTOLIN HFA) 108 (90 Base) MCG/ACT inhaler 2 puff (has no administration in time range)  metoprolol tartrate (LOPRESSOR) tablet 12.5 mg (has no administration in time range)    Mobility walks with device

## 2018-09-22 ENCOUNTER — Observation Stay (HOSPITAL_COMMUNITY): Payer: Medicare HMO | Admitting: Certified Registered Nurse Anesthetist

## 2018-09-22 ENCOUNTER — Encounter (HOSPITAL_COMMUNITY)
Admission: EM | Disposition: A | Discharge status: Home / Self Care | Payer: Self-pay | Source: Home / Self Care | Attending: Internal Medicine

## 2018-09-22 ENCOUNTER — Encounter (HOSPITAL_COMMUNITY): Payer: Self-pay | Admitting: *Deleted

## 2018-09-22 DIAGNOSIS — K5190 Ulcerative colitis, unspecified, without complications: Secondary | ICD-10-CM | POA: Diagnosis not present

## 2018-09-22 DIAGNOSIS — K633 Ulcer of intestine: Secondary | ICD-10-CM | POA: Diagnosis not present

## 2018-09-22 DIAGNOSIS — K922 Gastrointestinal hemorrhage, unspecified: Secondary | ICD-10-CM | POA: Diagnosis not present

## 2018-09-22 DIAGNOSIS — I4891 Unspecified atrial fibrillation: Secondary | ICD-10-CM

## 2018-09-22 HISTORY — PX: COLONOSCOPY: SHX5424

## 2018-09-22 HISTORY — PX: BIOPSY: SHX5522

## 2018-09-22 LAB — BASIC METABOLIC PANEL
ANION GAP: 7 (ref 5–15)
BUN: 17 mg/dL (ref 8–23)
CALCIUM: 8.7 mg/dL — AB (ref 8.9–10.3)
CO2: 23 mmol/L (ref 22–32)
CREATININE: 0.97 mg/dL (ref 0.61–1.24)
Chloride: 107 mmol/L (ref 98–111)
GLUCOSE: 76 mg/dL (ref 70–99)
Potassium: 4.1 mmol/L (ref 3.5–5.1)
Sodium: 137 mmol/L (ref 135–145)

## 2018-09-22 LAB — CBC
HEMATOCRIT: 32.6 % — AB (ref 39.0–52.0)
Hemoglobin: 10 g/dL — ABNORMAL LOW (ref 13.0–17.0)
MCH: 29.8 pg (ref 26.0–34.0)
MCHC: 30.7 g/dL (ref 30.0–36.0)
MCV: 97 fL (ref 80.0–100.0)
NRBC: 0 % (ref 0.0–0.2)
PLATELETS: 194 10*3/uL (ref 150–400)
RBC: 3.36 MIL/uL — ABNORMAL LOW (ref 4.22–5.81)
RDW: 14.4 % (ref 11.5–15.5)
WBC: 7.4 10*3/uL (ref 4.0–10.5)

## 2018-09-22 SURGERY — COLONOSCOPY
Anesthesia: Monitor Anesthesia Care

## 2018-09-22 MED ORDER — LACTATED RINGERS IV SOLN
INTRAVENOUS | Status: DC | PRN
  Administered 2018-09-22: 07:00:00 via INTRAVENOUS

## 2018-09-22 MED ORDER — PROPOFOL 10 MG/ML IV BOLUS
INTRAVENOUS | Status: DC | PRN
  Administered 2018-09-22: 20 mg via INTRAVENOUS

## 2018-09-22 MED ORDER — PROPOFOL 500 MG/50ML IV EMUL
INTRAVENOUS | Status: DC | PRN
  Administered 2018-09-22: 125 ug/kg/min via INTRAVENOUS

## 2018-09-22 MED ORDER — LIDOCAINE 2% (20 MG/ML) 5 ML SYRINGE
INTRAMUSCULAR | Status: DC | PRN
  Administered 2018-09-22: 40 mg via INTRAVENOUS

## 2018-09-22 MED ORDER — ONDANSETRON HCL 4 MG/2ML IJ SOLN: 4.0000 mg | Freq: Once | INTRAMUSCULAR | Status: DC | PRN

## 2018-09-22 MED ORDER — PROPOFOL 10 MG/ML IV BOLUS
INTRAVENOUS | Status: AC
  Filled 2018-09-22: qty 60

## 2018-09-22 MED ORDER — PHENYLEPHRINE 40 MCG/ML (10ML) SYRINGE FOR IV PUSH (FOR BLOOD PRESSURE SUPPORT)
PREFILLED_SYRINGE | INTRAVENOUS | Status: DC | PRN
  Administered 2018-09-22 (×2): 80 ug via INTRAVENOUS

## 2018-09-22 MED ORDER — MESALAMINE 1000 MG RE SUPP
1000.0000 mg | Freq: Two times a day (BID) | RECTAL | Status: DC
  Administered 2018-09-22 (×2): 1000 mg via RECTAL
  Filled 2018-09-22 (×3): qty 1

## 2018-09-22 NOTE — Anesthesia Postprocedure Evaluation (Signed)
Anesthesia Post Note  Patient: Bert L [place]  Procedure(s) Performed: COLONOSCOPY (N/A ) BIOPSY     Patient location during evaluation: PACU Anesthesia Type: MAC Level of consciousness: awake and alert and oriented Pain management: pain level controlled Vital Signs Assessment: post-procedure vital signs reviewed and stable Respiratory status: spontaneous breathing, nonlabored ventilation and respiratory function stable Cardiovascular status: stable and blood pressure returned to baseline Postop Assessment: no apparent nausea or vomiting Anesthetic complications: no    Last Vitals:  Vitals:   09/22/18 0752 09/22/18 0810  BP: (!) 82/49 95/72  Pulse: (!) 55   Resp: 20   Temp: 36.5 C   SpO2: 100%     Last Pain:  Vitals:   09/22/18 0810  TempSrc:   PainSc: 0-No pain                 Yogi Arther A.

## 2018-09-22 NOTE — Transfer of Care (Signed)
Immediate Anesthesia Transfer of Care Note  Patient: [name] [name]  Procedure(s) Performed: COLONOSCOPY (N/A ) BIOPSY  Patient Location: Endoscopy Unit  Anesthesia Type:MAC  Level of Consciousness: awake, alert , oriented and patient cooperative  Airway & Oxygen Therapy: Patient Spontanous Breathing and Patient connected to face mask  Post-op Assessment: Report given to RN and Post -op Vital signs reviewed and stable  Post vital signs: Reviewed and stable  Last Vitals:  Vitals Value Taken Time  BP    Temp    Pulse    Resp    SpO2      Last Pain:  Vitals:   09/22/18 0709  TempSrc: Oral  PainSc: 0-No pain         Complications: No apparent anesthesia complications

## 2018-09-22 NOTE — Op Note (Signed)
[facility] Patient Name: [name] [name] Procedure Date: 09/22/2018 MRN: [id number] Attending MD: Gatha Mayer , MD Date of Birth: 1932-10-03 CSN: [id number] Age: 84 Admit Type: Inpatient Procedure:                Colonoscopy Indications:              Hematochezia Providers:                Gatha Mayer, MD, Cleda Daub, RN, Laverda Sorenson, Technician, Dellie Catholic, CRNA Referring MD:              Medicines:                Monitored Anesthesia Care Complications:            No immediate complications. Estimated Blood Loss:     Estimated blood loss was minimal. Procedure:                Pre-Anesthesia Assessment:                           - Prior to the procedure, a History and Physical                            was performed, and patient medications and                            allergies were reviewed. The patient's tolerance of                            previous anesthesia was also reviewed. The risks                            and benefits of the procedure and the sedation                            options and risks were discussed with the patient.                            All questions were answered, and informed consent                            was obtained. Prior Anticoagulants: The patient has                            taken no previous anticoagulant or antiplatelet                            agents. ASA Grade Assessment: III - A patient with                            severe systemic disease. After reviewing the risks  and benefits, the patient was deemed in                            satisfactory condition to undergo the procedure.                           After obtaining informed consent, the colonoscope                            was passed under direct vision. Throughout the                            procedure, the patient's blood pressure, pulse, and                            oxygen  saturations were monitored continuously. The                            PCF-H190DL ([id number] Olympus peds colonoscope was                            introduced through the anus and advanced to the the                            ileocolonic anastomosis. The colonoscopy was                            performed without difficulty. The patient tolerated                            the procedure well. The quality of the bowel                            preparation was adequate. The rectum and                            Ileocolonic anastomsis areas were photographed. Scope In: 7:28:45 AM Scope Out: 7:42:50 AM Scope Withdrawal Time: 0 hours 10 minutes 53 seconds  Total Procedure Duration: 0 hours 14 minutes 5 seconds  Findings:      The perianal and digital rectal examinations were normal.      Discontinuous areas of nonbleeding ulcerated mucosa with stigmata of       recent bleeding were present in the rectum and in the sigmoid colon.       Biopsies were taken with a cold forceps for histology. Verification of       patient identification for the specimen was done. Estimated blood loss       was minimal.      A severe stenosis was found in the transverse colon and was       non-traversed.      There was evidence of a prior end-to-side ileo-colonic anastomosis in       the transverse colon. This was characterized by severe stenosis. The       anastomosis could not be traversed.      There was evidence of a prior end-to-side  colo-colonic anastomosis in       the distal sigmoid colon. This was patent and was characterized by       surrounding ulceration. The anastomosis was traversed.      Multiple diverticula were found in the entire colon.      The exam was otherwise without abnormality on direct and retroflexion       views. Impression:               - Mucosal ulceration. Biopsied.                           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                           - End-to-side  ileo-colonic anastomosis,                            characterized by severe stenosis. (Known and                            chronic)                           - Patent end-to-side colo-colonic anastomosis,                            characterized by ulceration. Procimal and distal                            (mostly)                           - Diverticulosis in the entire examined colon.                           - The examination was otherwise normal on direct                            and retroflexion views. Moderate Sedation:      N/A- Per Anesthesia Care Recommendation:           - Return patient to hospital ward for ongoing care.                           - Start Canasa suppository bid - empiric Tx                           ? ischemia ? prolapse - anastomotic dysfx? - IBD                            possible but doubt Procedure Code(s):        --- Professional ---                           [id number], Colonoscopy, flexible; with biopsy, single  or multiple Diagnosis Code(s):        --- Professional ---                           K63.3, Ulcer of intestine                           K56.699, Other intestinal obstruction unspecified                            as to partial versus complete obstruction                           Z98.0, Intestinal bypass and anastomosis status                           K92.1, Melena (includes Hematochezia)                           K57.30, Diverticulosis of large intestine without                            perforation or abscess without bleeding CPT copyright 2018 [employer]. All rights reserved. The codes documented in this report are preliminary and upon coder review may  be revised to meet current compliance requirements. Gatha Mayer, MD 09/22/2018 7:57:06 AM This report has been signed electronically. Number of Addenda: 0

## 2018-09-22 NOTE — Interval H&P Note (Signed)
History and Physical Interval Note:  09/22/2018 7:19 AM  [name] [name]  has presented today for surgery, with the diagnosis of rectal bleeding  The various methods of treatment have been discussed with the patient and family. After consideration of risks, benefits and other options for treatment, the patient has consented to  Procedure(s): COLONOSCOPY (N/A) as a surgical intervention .  The patient's history has been reviewed, patient examined, no change in status, stable for surgery.  I have reviewed the patient's chart and labs.  Questions were answered to the patient's satisfaction.     [name]

## 2018-09-22 NOTE — Progress Notes (Signed)
PROGRESS NOTE    [name] [name]  ZOX:[id number] DOB: August 06, 1932 DOA: 09/21/2018 PCP: [name] Infante, MD   Brief Narrative:  HPI per Dr. Landis Gandy on 09/21/18 [name] [name] is a 84 y.o. male with medical history significant of CABG in 8119 systolic heart failure, history of rectal cancer received radiation and chemotherapy and chronic atrial fibrillation diverticulosis, history of colon cancer recent hospital admission 06/29/2018 for the same now readmitted with bright red bleeding per rectum along with some dark blood but not melena.  He did have some abdominal cramping and nausea but no vomiting.  He is not on a blood thinner.  He has a history of atrial fibrillation.  Daughter reports he takes a lot of Imodium up to 4 tablets daily for a long time.  He denies any chest pain shortness of breath fever chills cough hematuria.  Patient is admitted 8 of 2019 with a similar complaints has was seen by GI recommended no intervention and was discharged home. ED Course: He remained hemodynamically stable with a hemoglobin of 11.0 white count 9.9 platelet count 230 he had no further bleed since coming to the ER.  **Under went EGD which showed mucosal ulceration that was biopsied and then there is a stricture in the transverse colon unclear why the patient had mucosal ulceration but he did also have a end to side ileocolonic anastomosis characterized by severe stenosis of his known chronic and the end of side: No colonic anastomosis was characterized by ulceration proximal and distal mostly.  There was diverticulosis in the entire examined colon and gastroenterology recommending observing the patient in the hospital for 1 more night and starting Canasa suppository twice daily as is unclear why patient had these ulcerations that could be ischemia, prolapse, or anastomotic dysfunction and doubtful of IBD.  Gastroenterology to make further recommendations tomorrow  Assessment & Plan:   Active Problems:  A-fib (HCC)   Lower GI bleed   Ulceration of intestine  Lower GI bleed of unclear etiology but likely from mucosal ulcerations and unclear why patient has mucosal ulcerations -Gastroenterology took the patient for a colonoscopy and it showed findings as below -Gastroenterology started mesalamine suppositories thousand milligrams rectally twice daily -GI to follow-up with the patient in the a.m. and further to discuss Lan of care further recommended observing another night -Patient has a history of diverticulosis -Hemoglobin/hematocrit went from 11.0/36.5 and is now 10.0/32.6 -Repeat CBC now and one in the AM  Chronic Atrial Fibrillation  -Rate controlled not on anticoagulation due to GI bleed -Continue home digoxin and beta-blocker reduced dose -Digoxin level was 0.4 -Continue telemetry Monitoring  History of ischemic cardiomyopathy s/p ICD, CAD and CABG  Chronic Systolic CHF with EF of 14-78% Stable at this time -Decreased the dose of beta-blocker since his blood pressure is soft  -He takes Lasix, ACE inhibitor, and beta-blocker at home -Continue tohold Lasix and ACE inhibitor since his blood pressure is already soft.   -C/w BB at at a reduced Dose  Hypertension -Pressures are soft so we will continue to monitor -Continue with metoprolol 12.5 mg p.o. daily  DVT prophylaxis: SCDs Code Status: FULL CODE Family Communication: Discussed with wife at bedside Disposition Plan: Anticipate D/C in the next 24-48 hours  Consultants:   Gastroenterology    Procedures:  EGD Findings:      The perianal and digital rectal examinations were normal.      Discontinuous areas of nonbleeding ulcerated mucosa with stigmata of  recent bleeding were present in the rectum and in the sigmoid colon.       Biopsies were taken with a cold forceps for histology. Verification of       patient identification for the specimen was done. Estimated blood loss       was minimal.      A severe  stenosis was found in the transverse colon and was       non-traversed.      There was evidence of a prior end-to-side ileo-colonic anastomosis in       the transverse colon. This was characterized by severe stenosis. The       anastomosis could not be traversed.      There was evidence of a prior end-to-side colo-colonic anastomosis in       the distal sigmoid colon. This was patent and was characterized by       surrounding ulceration. The anastomosis was traversed.      Multiple diverticula were found in the entire colon.      The exam was otherwise without abnormality on direct and retroflexion       views. Impression:               - Mucosal ulceration. Biopsied.                           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                           - End-to-side ileo-colonic anastomosis,                            characterized by severe stenosis. (Known and                            chronic)                           - Patent end-to-side colo-colonic anastomosis,                            characterized by ulceration. Procimal and distal                            (mostly)                           - Diverticulosis in the entire examined colon.                           - The examination was otherwise normal on direct                            and retroflexion views.   Antimicrobials:  Antibiotics Given (last 72 hours)    None     Subjective: Seen and examined after colonoscopy and states he had no further bleeding.  Denies any chest pain, lightheadedness or dizziness.  Wanting to go home.  No other concerns or plans at this time  Objective: Vitals:   09/22/18 0800 09/22/18 0810 09/22/18 0820 09/22/18 1248  BP: (!) 98/51 95/72 109/72  111/70  Pulse: (!) 112 (!) 48 (!) 137 73  Resp: (!) 29 19 18 14   Temp:    (!) 97.5 F (36.4 C)  TempSrc:    Oral  SpO2: 100% 97% 100% 100%  Weight:      Height:        Intake/Output Summary (Last 24 hours) at 09/22/2018 1757 Last  data filed at 09/22/2018 1230 Gross per 24 hour  Intake 1800 ml  Output -  Net 1800 ml   Filed Weights   09/21/18 1239  Weight: 60.8 kg   Examination: Physical Exam:  Constitutional: Thin frail, elderly cachectic Caucasian male who is currently in NAD and appears calm and comfortable Eyes: Lids and conjunctivae normal, sclerae anicteric  ENMT: External Ears, Nose appear normal. Slightly hard of hearing.  Neck: Appears normal, supple, no cervical masses, normal ROM, no appreciable thyromegaly; no JVD Respiratory: Diminished to auscultation bilaterally, no wheezing, rales, rhonchi or crackles. Normal respiratory effort and patient is not tachypenic. No accessory muscle use.  Cardiovascular: Irregularly Irregular, no murmurs / rubs / gallops. S1 and S2 auscultated. No extremity edema. 2+  Abdomen: Soft, non-tender, non-distended. No masses palpated. No appreciable hepatosplenomegaly. Bowel sounds positive x4.  GU: Deferred. Musculoskeletal: No clubbing / cyanosis of digits/nails. No joint deformity upper and lower extremities.  Skin: Has seborrheic keratosis. No rashes or lesions on a limited skin eval. No induration; Warm and dry.  Neurologic: CN 2-12 grossly intact with no focal deficits. Romberg sign and cerebellar reflexes not assessed.  Psychiatric: Normal judgment and insight. Alert and oriented x 3. Anxious mood and appropriate affect.   Data Reviewed: I have personally reviewed following labs and imaging studies  CBC: Recent Labs  Lab 09/21/18 0731 09/22/18 0454  WBC 9.9 7.4  HGB 11.0* 10.0*  HCT 36.5* 32.6*  MCV 97.1 97.0  PLT 230 742   Basic Metabolic Panel: Recent Labs  Lab 09/21/18 0731 09/22/18 0454  NA 139 137  K 4.1 4.1  CL 107 107  CO2 24 23  GLUCOSE 101* 76  BUN 26* 17  CREATININE 1.13 0.97  CALCIUM 8.9 8.7*   GFR: Estimated Creatinine Clearance: 48.8 mL/min (by C-G formula based on SCr of 0.97 mg/dL). Liver Function Tests: Recent Labs  Lab  09/21/18 0731  AST 28  ALT 18  ALKPHOS 38  BILITOT 0.9  PROT 6.6  ALBUMIN 3.6   No results for input(s): LIPASE, AMYLASE in the last 168 hours. No results for input(s): AMMONIA in the last 168 hours. Coagulation Profile: No results for input(s): INR, PROTIME in the last 168 hours. Cardiac Enzymes: No results for input(s): CKTOTAL, CKMB, CKMBINDEX, TROPONINI in the last 168 hours. BNP (last 3 results) No results for input(s): PROBNP in the last 8760 hours. HbA1C: No results for input(s): HGBA1C in the last 72 hours. CBG: No results for input(s): GLUCAP in the last 168 hours. Lipid Profile: No results for input(s): CHOL, HDL, LDLCALC, TRIG, CHOLHDL, LDLDIRECT in the last 72 hours. Thyroid Function Tests: No results for input(s): TSH, T4TOTAL, FREET4, T3FREE, THYROIDAB in the last 72 hours. Anemia Panel: No results for input(s): VITAMINB12, FOLATE, FERRITIN, TIBC, IRON, RETICCTPCT in the last 72 hours. Sepsis Labs: No results for input(s): PROCALCITON, LATICACIDVEN in the last 168 hours.  No results found for this or any previous visit (from the past 240 hour(s)).   Radiology Studies: No results found.  Scheduled Meds: . digoxin  0.0625 mg Oral Daily  . mesalamine  1,000 mg Rectal  BID  . metoprolol tartrate  12.5 mg Oral Daily   Continuous Infusions:   LOS: 0 days   Kerney Elbe, DO [facility] PAGER is on AMION  If 7PM-7AM, please contact night-coverage [email or web address] Password [facility] 09/22/2018, 5:57 PM

## 2018-09-23 DIAGNOSIS — Z9221 Personal history of antineoplastic chemotherapy: Secondary | ICD-10-CM | POA: Diagnosis not present

## 2018-09-23 DIAGNOSIS — Z886 Allergy status to analgesic agent status: Secondary | ICD-10-CM | POA: Diagnosis not present

## 2018-09-23 DIAGNOSIS — K633 Ulcer of intestine: Secondary | ICD-10-CM | POA: Diagnosis not present

## 2018-09-23 DIAGNOSIS — Z79899 Other long term (current) drug therapy: Secondary | ICD-10-CM | POA: Diagnosis not present

## 2018-09-23 DIAGNOSIS — K254 Chronic or unspecified gastric ulcer with hemorrhage: Secondary | ICD-10-CM | POA: Diagnosis present

## 2018-09-23 DIAGNOSIS — I4891 Unspecified atrial fibrillation: Secondary | ICD-10-CM | POA: Diagnosis not present

## 2018-09-23 DIAGNOSIS — K5731 Diverticulosis of large intestine without perforation or abscess with bleeding: Secondary | ICD-10-CM | POA: Diagnosis present

## 2018-09-23 DIAGNOSIS — Z888 Allergy status to other drugs, medicaments and biological substances status: Secondary | ICD-10-CM | POA: Diagnosis not present

## 2018-09-23 DIAGNOSIS — Z951 Presence of aortocoronary bypass graft: Secondary | ICD-10-CM | POA: Diagnosis not present

## 2018-09-23 DIAGNOSIS — Z85048 Personal history of other malignant neoplasm of rectum, rectosigmoid junction, and anus: Secondary | ICD-10-CM | POA: Diagnosis not present

## 2018-09-23 DIAGNOSIS — K922 Gastrointestinal hemorrhage, unspecified: Secondary | ICD-10-CM | POA: Diagnosis not present

## 2018-09-23 DIAGNOSIS — I480 Paroxysmal atrial fibrillation: Secondary | ICD-10-CM | POA: Diagnosis not present

## 2018-09-23 DIAGNOSIS — K56699 Other intestinal obstruction unspecified as to partial versus complete obstruction: Secondary | ICD-10-CM | POA: Diagnosis present

## 2018-09-23 DIAGNOSIS — Z23 Encounter for immunization: Secondary | ICD-10-CM | POA: Diagnosis present

## 2018-09-23 DIAGNOSIS — I2510 Atherosclerotic heart disease of native coronary artery without angina pectoris: Secondary | ICD-10-CM | POA: Diagnosis present

## 2018-09-23 DIAGNOSIS — I5022 Chronic systolic (congestive) heart failure: Secondary | ICD-10-CM | POA: Diagnosis present

## 2018-09-23 DIAGNOSIS — Z923 Personal history of irradiation: Secondary | ICD-10-CM | POA: Diagnosis not present

## 2018-09-23 DIAGNOSIS — K219 Gastro-esophageal reflux disease without esophagitis: Secondary | ICD-10-CM | POA: Diagnosis present

## 2018-09-23 DIAGNOSIS — I252 Old myocardial infarction: Secondary | ICD-10-CM | POA: Diagnosis not present

## 2018-09-23 DIAGNOSIS — I255 Ischemic cardiomyopathy: Secondary | ICD-10-CM | POA: Diagnosis present

## 2018-09-23 DIAGNOSIS — Z95810 Presence of automatic (implantable) cardiac defibrillator: Secondary | ICD-10-CM | POA: Diagnosis not present

## 2018-09-23 DIAGNOSIS — Z87891 Personal history of nicotine dependence: Secondary | ICD-10-CM | POA: Diagnosis not present

## 2018-09-23 DIAGNOSIS — I110 Hypertensive heart disease with heart failure: Secondary | ICD-10-CM | POA: Diagnosis present

## 2018-09-23 DIAGNOSIS — K921 Melena: Secondary | ICD-10-CM | POA: Diagnosis present

## 2018-09-23 DIAGNOSIS — I482 Chronic atrial fibrillation, unspecified: Secondary | ICD-10-CM | POA: Diagnosis present

## 2018-09-23 DIAGNOSIS — I10 Essential (primary) hypertension: Secondary | ICD-10-CM | POA: Diagnosis not present

## 2018-09-23 LAB — CBC WITH DIFFERENTIAL/PLATELET
Abs Immature Granulocytes: 0.05 10*3/uL (ref 0.00–0.07)
Abs Immature Granulocytes: 0.04 10*3/uL (ref 0.00–0.07)
BASOS ABS: 0 10*3/uL (ref 0.0–0.1)
Basophils Absolute: 0 10*3/uL (ref 0.0–0.1)
Basophils Relative: 0 %
Basophils Relative: 1 %
EOS ABS: 0.1 10*3/uL (ref 0.0–0.5)
EOS PCT: 2 %
EOS PCT: 2 %
Eosinophils Absolute: 0.1 10*3/uL (ref 0.0–0.5)
HEMATOCRIT: 31.2 % — AB (ref 39.0–52.0)
HEMATOCRIT: 32.5 % — AB (ref 39.0–52.0)
HEMOGLOBIN: 9.9 g/dL — AB (ref 13.0–17.0)
Hemoglobin: 10.1 g/dL — ABNORMAL LOW (ref 13.0–17.0)
IMMATURE GRANULOCYTES: 1 %
Immature Granulocytes: 1 %
LYMPHS ABS: 0.5 10*3/uL — AB (ref 0.7–4.0)
LYMPHS ABS: 0.8 10*3/uL (ref 0.7–4.0)
LYMPHS PCT: 10 %
Lymphocytes Relative: 6 %
MCH: 30.1 pg (ref 26.0–34.0)
MCH: 29.7 pg (ref 26.0–34.0)
MCHC: 31.7 g/dL (ref 30.0–36.0)
MCHC: 31.1 g/dL (ref 30.0–36.0)
MCV: 94.8 fL (ref 80.0–100.0)
MCV: 95.6 fL (ref 80.0–100.0)
MONOS PCT: 7 %
Monocytes Absolute: 0.6 10*3/uL (ref 0.1–1.0)
Monocytes Absolute: 0.6 10*3/uL (ref 0.1–1.0)
Monocytes Relative: 7 %
NEUTROS ABS: 7.6 10*3/uL (ref 1.7–7.7)
NEUTROS PCT: 84 %
NEUTROS PCT: 79 %
NRBC: 0 % (ref 0.0–0.2)
NRBC: 0 % (ref 0.0–0.2)
Neutro Abs: 6.8 10*3/uL (ref 1.7–7.7)
PLATELETS: 189 10*3/uL (ref 150–400)
Platelets: 191 10*3/uL (ref 150–400)
RBC: 3.29 MIL/uL — ABNORMAL LOW (ref 4.22–5.81)
RBC: 3.4 MIL/uL — AB (ref 4.22–5.81)
RDW: 14 % (ref 11.5–15.5)
RDW: 14.3 % (ref 11.5–15.5)
WBC: 8.9 10*3/uL (ref 4.0–10.5)
WBC: 8.5 10*3/uL (ref 4.0–10.5)

## 2018-09-23 LAB — COMPREHENSIVE METABOLIC PANEL
ALBUMIN: 3 g/dL — AB (ref 3.5–5.0)
ALT: 14 U/L (ref 0–44)
ANION GAP: 7 (ref 5–15)
AST: 24 U/L (ref 15–41)
Alkaline Phosphatase: 34 U/L — ABNORMAL LOW (ref 38–126)
BILIRUBIN TOTAL: 0.8 mg/dL (ref 0.3–1.2)
BUN: 19 mg/dL (ref 8–23)
CHLORIDE: 108 mmol/L (ref 98–111)
CO2: 25 mmol/L (ref 22–32)
Calcium: 8.9 mg/dL (ref 8.9–10.3)
Creatinine, Ser: 1.04 mg/dL (ref 0.61–1.24)
GFR calc Af Amer: >60 mL/min (ref >60)
GFR calc non Af Amer: >60 mL/min (ref >60)
GLUCOSE: 83 mg/dL (ref 70–99)
POTASSIUM: 4 mmol/L (ref 3.5–5.1)
SODIUM: 140 mmol/L (ref 135–145)
TOTAL PROTEIN: 5.8 g/dL — AB (ref 6.5–8.1)

## 2018-09-23 LAB — MAGNESIUM: Magnesium: 1.8 mg/dL (ref 1.7–2.4)

## 2018-09-23 LAB — PHOSPHORUS: Phosphorus: 2.7 mg/dL (ref 2.5–4.6)

## 2018-09-23 MED ORDER — MESALAMINE 1000 MG RE SUPP
1000.0000 mg | Freq: Two times a day (BID) | RECTAL | Status: DC
  Administered 2018-09-23 – 2018-09-24 (×3): 1000 mg via RECTAL
  Filled 2018-09-23 (×3): qty 1

## 2018-09-23 MED ORDER — FUROSEMIDE 20 MG PO TABS: 20.0000 mg | ORAL_TABLET | ORAL | Status: DC

## 2018-09-23 MED ORDER — FUROSEMIDE 40 MG PO TABS
40.0000 mg | ORAL_TABLET | ORAL | Status: DC
  Administered 2018-09-24: 40 mg via ORAL
  Filled 2018-09-23: qty 1

## 2018-09-23 MED ORDER — RAMIPRIL 2.5 MG PO CAPS
2.5000 mg | ORAL_CAPSULE | Freq: Every day | ORAL | Status: DC
  Administered 2018-09-24: 2.5 mg via ORAL
  Filled 2018-09-23: qty 1

## 2018-09-23 MED ORDER — SPIRONOLACTONE 12.5 MG HALF TABLET: 12.5000 mg | ORAL_TABLET | Freq: Every day | ORAL | Status: DC

## 2018-09-23 MED ORDER — METOPROLOL SUCCINATE ER 100 MG PO TB24
100.0000 mg | ORAL_TABLET | Freq: Every day | ORAL | Status: DC
  Administered 2018-09-23 – 2018-09-24 (×2): 100 mg via ORAL
  Filled 2018-09-23 (×3): qty 1

## 2018-09-23 MED ORDER — SPIRONOLACTONE 12.5 MG HALF TABLET
12.5000 mg | ORAL_TABLET | Freq: Every day | ORAL | Status: DC
  Administered 2018-09-24: 12.5 mg via ORAL
  Filled 2018-09-23: qty 1

## 2018-09-23 NOTE — Progress Notes (Signed)
Pt ambulated around 240 feet in hallway with minimal assist. Tolerated well- denied SOB or lightheadedness. Did not have to stop until halfway point. Was able to talk entire walk. HR touched 133 very briefly and immediately came back down- stayed around 90-110s majority of walk. BP 98/60 HR 65 before walk, 113/74 HR 81 after walk. PT assisted with walk. Family at bedside. Pt assisted back to bed to watch the race on TV.

## 2018-09-23 NOTE — Progress Notes (Signed)
PROGRESS NOTE    [name] [name]  WEX:[id number] DOB: 07-18-1932 DOA: 09/21/2018 PCP: Crist Infante, MD   Brief Narrative:  HPI per Dr. Landis Gandy on 09/21/18 [name] [name] is a 84 y.o. male with medical history significant of CABG in 9381 systolic heart failure, history of rectal cancer received radiation and chemotherapy and chronic atrial fibrillation diverticulosis, history of colon cancer recent hospital admission 06/29/2018 for the same now readmitted with bright red bleeding per rectum along with some dark blood but not melena.  He did have some abdominal cramping and nausea but no vomiting.  He is not on a blood thinner.  He has a history of atrial fibrillation.  Daughter reports he takes a lot of Imodium up to 4 tablets daily for a long time.  He denies any chest pain shortness of breath fever chills cough hematuria.  Patient is admitted 8 of 2019 with a similar complaints has was seen by GI recommended no intervention and was discharged home. ED Course: He remained hemodynamically stable with a hemoglobin of 11.0 white count 9.9 platelet count 230 he had no further bleed since coming to the ER.  **Under went EGD which showed mucosal ulceration that was biopsied and then there is a stricture in the transverse colon unclear why the patient had mucosal ulceration but he did also have a end to side ileocolonic anastomosis characterized by severe stenosis of his known chronic and the end of side: No colonic anastomosis was characterized by ulceration proximal and distal mostly.  There was diverticulosis in the entire examined colon and gastroenterology recommending observing the patient in the hospital for 1 more night and starting Canasa suppository twice daily as is unclear why patient had these ulcerations that could be ischemia, prolapse, or anastomotic dysfunction and doubtful of IBD.  Gastroenterology and continue Canasa while hospitalized however.  At discharge.  They will sort further  treatment plans pending biopsy results and states that Imodium as needed is okay but avoid Lomotil for diarrhea.  Overnight the patient went into A. fib with uncontrolled RVR.  Cardiology is consulted for further evaluation recommendations and patient was placed back on his home metoprolol  Assessment & Plan:   Active Problems:   A-fib (HCC)   Lower GI bleed   Ulceration of intestine   GI bleed  Lower GI bleed of unclear etiology but likely from mucosal ulcerations and unclear why patient has mucosal ulcerations -Gastroenterology took the patient for a colonoscopy and it showed findings as below -Gastroenterology started mesalamine suppositories thousand milligrams rectally twice daily -GI to follow-up with the patient in the a.m. and further to discuss plan of care further recommended observing last night observe for any more bleeding and ensure stability -Patient has a history of diverticulosis -Hemoglobin/hematocrit went from 11.0/36.5 and is now 10.1/32.5 -Repeat CBC in a.m. prior to discharge -PT evaluated and recommended to follow-up  Chronic Atrial Fibrillation now with RVR and uncontrolled rates -Was previously rate controlled despite rates into the 150s and 160s last night  -not on anticoagulation due to GI bleed -Continue home digoxin and beta-blocker reduced dose but resumed home dose today -Digoxin level was 0.4 -Continue telemetry Monitoring -Cardiology consulted for further evaluation recommendations given uncontrolled rates uncontrolled rates and Dr. Harrington Challenger is recommending following for now  History of ischemic cardiomyopathy s/p ICD, CAD and CABG  Chronic Systolic CHF with EF of 01-75% -Stable at this time and patient is Euvolemic  -Decreased the dose of beta-blocker since his blood pressure  was soft but resumed home dose today -He takes Lasix, ACE inhibitor, and beta-blocker at home -Continued hold Lasix and ACE inhibitor since his blood pressure yesterday but  cardiology consulted and recommended resuming slowly about half the dose for Sprionolactone and 1/4 the dose for Ramipril -Ramipril restarted 2.5 mg p.o. Daily (Takes 10 mg po Daily) and Spironolactone started 12.5 mg p.o. Daily (Takes 25 mg po Daily).  Furosemide restarted at 20 mg p.o. on Saturday and Sunday and then 40 mg M-F -C/w BB at at a reduced Dose but now increased to home dose  Hypertension -Pressures were soft with cardiology resuming medications and have resumed ramipril at a fourth dose at 2.5 mg p.o. daily and resume spironolactone at 12.5 mg p.o. daily and half the dose of his home dose. -They have also resumed furosemide at home dose -Continue with home metoprolol dose -Follow blood pressures  DVT prophylaxis: SCDs Code Status: FULL CODE Family Communication: Discussed with wife at bedside Disposition Plan: Anticipate D/C in the next 24-48 hours if medically stable  Consultants:   Gastroenterology   Cardiology    Procedures:  EGD Findings:      The perianal and digital rectal examinations were normal.      Discontinuous areas of nonbleeding ulcerated mucosa with stigmata of       recent bleeding were present in the rectum and in the sigmoid colon.       Biopsies were taken with a cold forceps for histology. Verification of       patient identification for the specimen was done. Estimated blood loss       was minimal.      A severe stenosis was found in the transverse colon and was       non-traversed.      There was evidence of a prior end-to-side ileo-colonic anastomosis in       the transverse colon. This was characterized by severe stenosis. The       anastomosis could not be traversed.      There was evidence of a prior end-to-side colo-colonic anastomosis in       the distal sigmoid colon. This was patent and was characterized by       surrounding ulceration. The anastomosis was traversed.      Multiple diverticula were found in the entire colon.       The exam was otherwise without abnormality on direct and retroflexion       views. Impression:               - Mucosal ulceration. Biopsied.                           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                           - End-to-side ileo-colonic anastomosis,                            characterized by severe stenosis. (Known and                            chronic)                           - Patent end-to-side colo-colonic anastomosis,  characterized by ulceration. Procimal and distal                            (mostly)                           - Diverticulosis in the entire examined colon.                           - The examination was otherwise normal on direct                            and retroflexion views.   Antimicrobials:  Antibiotics Given (last 72 hours)    None     Subjective: Seen and examined at bedside and family and patient are very frustrated and irate when I walked in.  They started yelling at me about why his home medications were not resumed and patient was wanting to go.  I I calmly explained to them that his blood pressures were low and that he was bleeding when he came in as why his blood pressure medications were held and that we need to slowly add them back.  They did not believe me I told him they were going to be added back today slowly but they started arguing with me again.  His heart rates were uncontrolled however patient was recent dramatic but heart rates jumped in the 160s so I consulted cardiology.  They do not believe anything I said and only wanted to discuss with cardiology.  Patient denied any chest pain, lightheadedness or dizziness.  Very frustrated and daughter at bedside is extremely irate.  No other concerns or complaints at this time and I tried to answer all the questions to the patient's satisfaction but they only wanted to speak with cardiology.  Objective: Vitals:   09/23/18 0645 09/23/18 0920 09/23/18  1400 09/23/18 1411  BP:  94/71 98/60 113/74  Pulse: 88 (!) 150 65 81  Resp:  20    Temp:      TempSrc:      SpO2:      Weight:      Height:        Intake/Output Summary (Last 24 hours) at 09/23/2018 1844 Last data filed at 09/23/2018 0932 Gross per 24 hour  Intake 190 ml  Output -  Net 190 ml   Filed Weights   09/21/18 1239  Weight: 60.8 kg   Examination: Physical Exam:  Constitutional: Thin, frail, elderly cachectic Caucasian male who is currently very agitated and and riled up by his family; daughter at bedside is very irate and is yelling Eyes: Lids and conjunctive are normal.  Sclera anicteric ENMT: External ears and nose appear normal.  Slightly hard of hearing Neck: Appears supple no JVD Respiratory: Diminished to auscultation bilaterally no appreciable wheezing, rales, rhonchi.  Patient not tachypneic using accessory muscle breathe Cardiovascular: Regular irregular and tachycardic.  No lower extremity edema Abdomen: Soft, nontender, nondistended.  Bowel sounds present GU: Deferred Musculoskeletal: No contractures cyanosis.  No joint deformities noted Skin: Is warm and dry.  Has seborrheic keratosis.  No appreciable rashes or lesions limited skin evaluation Neurologic: Cranial nerves II through XII grossly intact no point appreciable focal deficits Psychiatric: Patient is very agitated and irritated at this time.  He is awake and alert and oriented x3.  Data Reviewed: I have personally reviewed following labs and imaging studies  CBC: Recent Labs  Lab 09/21/18 0731 09/22/18 0454 09/22/18 1854 09/23/18 0511  WBC 9.9 7.4 8.9 8.5  NEUTROABS  --   --  7.6 6.8  HGB 11.0* 10.0* 9.9* 10.1*  HCT 36.5* 32.6* 31.2* 32.5*  MCV 97.1 97.0 94.8 95.6  PLT 230 194 191 758   Basic Metabolic Panel: Recent Labs  Lab 09/21/18 0731 09/22/18 0454 09/23/18 0511  NA 139 137 140  K 4.1 4.1 4.0  CL 107 107 108  CO2 24 23 25   GLUCOSE 101* 76 83  BUN 26* 17 19  CREATININE  1.13 0.97 1.04  CALCIUM 8.9 8.7* 8.9  MG  --   --  1.8  PHOS  --   --  2.7   GFR: Estimated Creatinine Clearance: 45.5 mL/min (by C-G formula based on SCr of 1.04 mg/dL). Liver Function Tests: Recent Labs  Lab 09/21/18 0731 09/23/18 0511  AST 28 24  ALT 18 14  ALKPHOS 38 34*  BILITOT 0.9 0.8  PROT 6.6 5.8*  ALBUMIN 3.6 3.0*   No results for input(s): LIPASE, AMYLASE in the last 168 hours. No results for input(s): AMMONIA in the last 168 hours. Coagulation Profile: No results for input(s): INR, PROTIME in the last 168 hours. Cardiac Enzymes: No results for input(s): CKTOTAL, CKMB, CKMBINDEX, TROPONINI in the last 168 hours. BNP (last 3 results) No results for input(s): PROBNP in the last 8760 hours. HbA1C: No results for input(s): HGBA1C in the last 72 hours. CBG: No results for input(s): GLUCAP in the last 168 hours. Lipid Profile: No results for input(s): CHOL, HDL, LDLCALC, TRIG, CHOLHDL, LDLDIRECT in the last 72 hours. Thyroid Function Tests: No results for input(s): TSH, T4TOTAL, FREET4, T3FREE, THYROIDAB in the last 72 hours. Anemia Panel: No results for input(s): VITAMINB12, FOLATE, FERRITIN, TIBC, IRON, RETICCTPCT in the last 72 hours. Sepsis Labs: No results for input(s): PROCALCITON, LATICACIDVEN in the last 168 hours.  No results found for this or any previous visit (from the past 240 hour(s)).   Radiology Studies: No results found.  Scheduled Meds: . digoxin  0.0625 mg Oral Daily  . [START ON 09/29/2018] furosemide  20 mg Oral Once per day on Sun Sat  . [START ON 09/24/2018] furosemide  40 mg Oral Once per day on Mon Tue Wed Thu Fri  . mesalamine  1,000 mg Rectal BID  . metoprolol succinate  100 mg Oral Daily  . [START ON 09/24/2018] ramipril  2.5 mg Oral Daily  . [START ON 09/24/2018] spironolactone  12.5 mg Oral Daily   Continuous Infusions:   LOS: 0 days   Kerney Elbe, DO [facility] PAGER is on AMION  If 7PM-7AM, please contact  night-coverage [email or web address] Password [facility] 09/23/2018, 6:44 PM

## 2018-09-23 NOTE — Progress Notes (Signed)
Pt reports he did NOT have a stool during this shift.  States his last stool was yesterday during day shift it was dark brown medium in size.    Pt HR increased to 150 - 170 this AM when getting up to BR but did not sustain.  Is currently 88.  Awaiting morning labs.

## 2018-09-23 NOTE — Progress Notes (Addendum)
    Chart review indicates patient ok for DC from GI perspective  Does look like has RVR w/ Afib though  Recommendations:  1) I will contact patient from office about f/u after I see path results 2) I would most likely stop Canasa at DC unless we know it is not empiric- it can be very expensive 3) Will sort out further treatment plans pending biopsy results - ? Cause oif this - did he have an impaction? 4) Imodium prn ok but avoid Lomotil for diarrhea  Let me know if ?  We will see tomorrow  Gatha Mayer, MD, [facility] Gastroenterology 09/23/2018 9:41 AM Pager [phone]

## 2018-09-23 NOTE — Consult Note (Signed)
Cardiology Consultation:   Patient ID: [name] [name] MRN: [id number]; DOB: 1932/03/25  Admit date: 09/21/2018 Date of Consult: 09/23/2018  Primary Care Provider: Crist Infante, MD Primary Cardiologist: [name]   Patient Profile:   [name] [name] is a 84 y.o. male with a hx of CAD and CHF who is being seen today for the evaluation of atrial fibrillatoin  at the request of  History of Present Illness:   [name] is an 84 yo with history of colon CA x 2 and GI bleeding   He also has a history of CAD (remote Inferolateral MI)   LVEF 35 to 40% (echo 2012; ICD revised to PPM only in No 2016)   Alos a history of atrial fibrillation  He is followed by P [name]   Last in clinic on 08/20/18    Lasix was increased at that time and pt  Notes signif response with wt loss  The pt has had intermitt GP bleeding since august   Came in this admit (10/26) with recurrent BRBPR  And abdominal cramping   BP has been labile 82 to 108/     Denies CP   Breathing is better  No dizziness   He has fallen  Pt and familly are frustrated that he has not gotten his heart meds since Admt until today  Says his BP is always low at home     Past Medical History:  Diagnosis Date  . CAD (coronary artery disease)    Old inferolateral MI s/p CABG in 2002  . CAP (community acquired pneumonia) 10/03/2011  . Cardiomyopathy    s/p ICD 09/30/08; EF 35 to 40% per echo in October 2021  . Chronic systolic CHF (congestive heart failure) ([place]) 07/24/2013  . Colon cancer ([place])    s/p chemo, RT, sugery  . Colorectal anastomotic stricture   . Diverticulosis   . ED (erectile dysfunction)   . GERD (gastroesophageal reflux disease)    rarely  . HTN (hypertension)   . ICD (implantable cardiac defibrillator) discharge October 2012   ICD shock for afib with RVR  . Iron deficiency anemia   . Paroxysmal atrial fibrillation (HCC)    controlled with amiodarone, not a coumadin candidate due to GI bleeding    Past Surgical History:    Procedure Laterality Date  . APPENDECTOMY    . BACK SURGERY    . CARDIAC CATHETERIZATION  11/07/2000   EF 32%  . CARDIAC DEFIBRILLATOR PLACEMENT  09/30/08   MDT dual chamber ICD placed by Dr Rayann Heman  . CARDIOVASCULAR STRESS TEST  08/06/2008   EF 30%  . CHOLECYSTECTOMY    . COLON RESECTION     x2  . COLONOSCOPY     multiple  . CORONARY ARTERY BYPASS GRAFT    . EP IMPLANTABLE DEVICE N/A 10/15/2015   Procedure:  PPM Generator Changeout;  Surgeon: [name], MD;  Location: [facility];  Service: Cardiovascular;  Laterality: N/A;  . HERNIA REPAIR    . US ECHOCARDIOGRAPHY  08/11/2008   EF 30-35%  . VASECTOMY         Inpatient Medications: Scheduled Meds: . digoxin  0.0625 mg Oral Daily  . mesalamine  1,000 mg Rectal BID  . metoprolol succinate  100 mg Oral Daily   Continuous Infusions:  PRN Meds: albuterol  Allergies:    Allergies  Allergen Reactions  . Iron     Stomach issues  . Lescol [Fluvastatin Sodium]     Dizziness  .  Pentazocine Lactate     unknown  . Vytorin [Ezetimibe-Simvastatin]     Leg cramps   . Aspirin     Bleeding     Social History:   Social History   Socioeconomic History  . Marital status: Married    Spouse name: Not on file  . Number of children: 2  . Years of education: Not on file  . Highest education level: Not on file  Occupational History  . Occupation: Retired     Fish farm manager: RETIRED  Social Needs  . Financial resource strain: Not on file  . Food insecurity:    Worry: Not on file    Inability: Not on file  . Transportation needs:    Medical: Not on file    Non-medical: Not on file  Tobacco Use  . Smoking status: Former Smoker    Types: Cigars  . Smokeless tobacco: Never Used  Substance and Sexual Activity  . Alcohol use: No  . Drug use: No  . Sexual activity: Not Currently  Lifestyle  . Physical activity:    Days per week: Not on file    Minutes per session: Not on file  . Stress: Not on file  Relationships   . Social connections:    Talks on phone: Not on file    Gets together: Not on file    Attends religious service: Not on file    Active member of club or organization: Not on file    Attends meetings of clubs or organizations: Not on file    Relationship status: Not on file  . Intimate partner violence:    Fear of current or ex partner: Not on file    Emotionally abused: Not on file    Physically abused: Not on file    Forced sexual activity: Not on file  Other Topics Concern  . Not on file  Social History Narrative   Lives in [place] with spouse.  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 Colon cancer Father   . Diabetes Sister   . Colon polyps Brother   . Pancreatic cancer Brother 68     ROS:  Please see the history of present illness.   All other ROS reviewed and negative.     Physical Exam/Data:   Vitals:   09/22/18 2046 09/23/18 0446 09/23/18 0645 09/23/18 0920  BP: (!) 102/56 105/82  94/71  Pulse: 87 (!) 120 88 (!) 150  Resp: 18 18  20   Temp: 98.2 F (36.8 C) 98.3 F (36.8 C)    TempSrc:      SpO2: 96% 97%    Weight:      Height:        Intake/Output Summary (Last 24 hours) at 09/23/2018 1047 Last data filed at 09/23/2018 0925 Gross per 24 hour  Intake 430 ml  Output -  Net 430 ml   Filed Weights   09/21/18 1239  Weight: 60.8 kg   Body mass index is 18.69 kg/m.  General: Thin 84 yo in no acute distress HEENT: normal Lymph: no adenopathy Neck: no JVD Endocrine:  No thryomegaly Cardiac:  Irreg rate and rhythm   Normal S1, S2; RRR; Gr II/VI systolic murmur  Lungs:  Mild rales and pop at R base  Abd: soft, nontender, no hepatomegaly  Ext: no edema Musculoskeletal:  No deformities, BUE and BLE strength normal and equal Skin: warm and dry  Neuro:  CNs 2-12 intact, no focal abnormalities  noted Psych:  Normal affect   EKG:  The EKG was personally reviewed and demonstrates:    Atrial fib 99 bpm   LVH with repol abnormalitiy   Telemetry:  Telemetry was personally reviewed and demonstrates:  Atrial fib   80s to 150  Average 100    Relevant CV Studies:   Laboratory Data:  Chemistry Recent Labs  Lab 09/21/18 0731 09/22/18 0454 09/23/18 0511  NA 139 137 140  K 4.1 4.1 4.0  CL 107 107 108  CO2 24 23 25   GLUCOSE 101* 76 83  BUN 26* 17 19  CREATININE 1.13 0.97 1.04  CALCIUM 8.9 8.7* 8.9  GFRNONAA 58* >60 >60  GFRAA >60 >60 >60  ANIONGAP 8 7 7     Recent Labs  Lab 09/21/18 0731 09/23/18 0511  PROT 6.6 5.8*  ALBUMIN 3.6 3.0*  AST 28 24  ALT 18 14  ALKPHOS 38 34*  BILITOT 0.9 0.8   Hematology Recent Labs  Lab 09/22/18 0454 09/22/18 1854 09/23/18 0511  WBC 7.4 8.9 8.5  RBC 3.36* 3.29* 3.40*  HGB 10.0* 9.9* 10.1*  HCT 32.6* 31.2* 32.5*  MCV 97.0 94.8 95.6  MCH 29.8 30.1 29.7  MCHC 30.7 31.7 31.1  RDW 14.4 14.0 14.3  PLT 194 191 189   Cardiac EnzymesNo results for input(s): TROPONINI in the last 168 hours. No results for input(s): TROPIPOC in the last 168 hours.  BNPNo results for input(s): BNP, PROBNP in the last 168 hours.  DDimer No results for input(s): DDIMER in the last 168 hours.  Radiology/Studies:  No results found.  Assessment and Plan:   Pt is an 84 yo with multiple medical problems   Admitted with BRBPR and abdominal cramping    Asked to see re afib and CHF  1.  Atrial fibrillation   Pt has been on rate control (digoxin and metoprolol)  No anticoag given GI history   Rates have been up and down   He just started back on these meds today    Would follow for now  2   CHF  Volume is OK   He was on lasix 40 5 days per week and 20 on weekend at home   Off now   I would resume in am    Follow BP, Wt and labs    ACE I and aldactone on hold with labile BP     I would resume tomorrow slowly  (1/2 dose) and follow BP  3  CAD   No symtpoms of  angina   Follow     Follow H/H  Ambulate today and follow HR and BP response.   Pt to probably go home tomorrow.      For questions or updates, please contact [place] Please consult [email or web address] for contact info under     Signed, Dorris Carnes, MD  09/23/2018 10:47 AM

## 2018-09-23 NOTE — Evaluation (Signed)
Physical Therapy Evaluation Patient Details Name: [name] [name] MRN: [id number] DOB: [birth year] Today's Date: 09/23/2018   History of Present Illness  84 yo male admitted with LGIB. Hx of CHF, A fib, CABG, rectal ca, HF  Clinical Impression  Pt admitted with GIB and presenting with functional mobility limitations 2* generalized weakness and mild ambulatory balance deficits.  Pt should progress to dc home with family assist.    Follow Up Recommendations No PT follow up    Equipment Recommendations  None recommended by PT    Recommendations for Other Services       Precautions / Restrictions Precautions Precautions: Fall Restrictions Weight Bearing Restrictions: No      Mobility  Bed Mobility Overal bed mobility: Modified Independent             General bed mobility comments: Pt unassisted supine<>sit  Transfers Overall transfer level: Needs assistance Equipment used: 1 person hand held assist Transfers: Sit to/from Stand Sit to Stand: Min guard         General transfer comment: steady assist  Ambulation/Gait Ambulation/Gait assistance: Min assist Gait Distance (Feet): 300 Feet Assistive device: None;1 person hand held assist Gait Pattern/deviations: Step-through pattern;Decreased step length - right;Decreased step length - left;Shuffle;Trunk flexed;Wide base of support Gait velocity: decr   General Gait Details: mild general instability but no loss of balance noted - pt states close to baseline for ambulating at home`  Stairs            Wheelchair Mobility    Modified Rankin (Stroke Patients Only)       Balance Overall balance assessment: Needs assistance Sitting-balance support: No upper extremity supported;Feet supported Sitting balance-Leahy Scale: Good     Standing balance support: No upper extremity supported Standing balance-Leahy Scale: Fair                               Pertinent Vitals/Pain Pain Assessment:  No/denies pain    Home Living Family/patient expects to be discharged to:: Private residence Living Arrangements: Spouse/significant other Available Help at Discharge: Family Type of Home: Apartment Home Access: Level entry     Home Layout: One level Home Equipment: Cane - single point      Prior Function Level of Independence: Independent;Independent with assistive device(s)         Comments: Pt uses cane "most of the time"     Hand Dominance   Dominant Hand: Right    Extremity/Trunk Assessment   Upper Extremity Assessment Upper Extremity Assessment: Generalized weakness    Lower Extremity Assessment Lower Extremity Assessment: Generalized weakness    Cervical / Trunk Assessment Cervical / Trunk Assessment: Kyphotic  Communication   Communication: No difficulties  Cognition Arousal/Alertness: Awake/alert Behavior During Therapy: WFL for tasks assessed/performed Overall Cognitive Status: Within Functional Limits for tasks assessed                                        General Comments      Exercises     Assessment/Plan    PT Assessment Patient needs continued PT services  PT Problem List Decreased strength;Decreased range of motion;Decreased activity tolerance;Decreased balance;Decreased mobility;Decreased knowledge of use of DME       PT Treatment Interventions DME instruction;Gait training;Functional mobility training;Therapeutic activities;Therapeutic exercise;Balance training;Patient/family education    PT Goals (Current goals can be found  in the Care Plan section)  Acute Rehab PT Goals Patient Stated Goal: Regain IND PT Goal Formulation: With patient Time For Goal Achievement: 10/06/18 Potential to Achieve Goals: Good    Frequency Min 3X/week   Barriers to discharge        Co-evaluation               AM-PAC PT "6 Clicks" Daily Activity  Outcome Measure Difficulty turning over in bed (including adjusting  bedclothes, sheets and blankets)?: None Difficulty moving from lying on back to sitting on the side of the bed? : A Little Difficulty sitting down on and standing up from a chair with arms (e.g., wheelchair, bedside commode, etc,.)?: Unable Help needed moving to and from a bed to chair (including a wheelchair)?: A Little Help needed walking in hospital room?: A Little Help needed climbing 3-5 steps with a railing? : A Little 6 Click Score: 17    End of Session Equipment Utilized During Treatment: Gait belt Activity Tolerance: Patient tolerated treatment well Patient left: in bed;with call bell/phone within reach;with family/visitor present Nurse Communication: Mobility status PT Visit Diagnosis: Difficulty in walking, not elsewhere classified (R26.2)    Time: 1601-0932 PT Time Calculation (min) (ACUTE ONLY): 22 min   Charges:   PT Evaluation $PT Eval Low Complexity: [place] Pager [phone] Office [phone]   Walt Geathers 09/23/2018, 4:17 PM

## 2018-09-24 ENCOUNTER — Encounter (HOSPITAL_COMMUNITY): Payer: Self-pay | Admitting: Internal Medicine

## 2018-09-24 DIAGNOSIS — Z23 Encounter for immunization: Secondary | ICD-10-CM | POA: Diagnosis not present

## 2018-09-24 DIAGNOSIS — I10 Essential (primary) hypertension: Secondary | ICD-10-CM

## 2018-09-24 DIAGNOSIS — I480 Paroxysmal atrial fibrillation: Secondary | ICD-10-CM

## 2018-09-24 LAB — CBC WITH DIFFERENTIAL/PLATELET
Abs Immature Granulocytes: 0.04 10*3/uL (ref 0.00–0.07)
BASOS PCT: 1 %
Basophils Absolute: 0 10*3/uL (ref 0.0–0.1)
EOS ABS: 0.3 10*3/uL (ref 0.0–0.5)
EOS PCT: 4 %
HCT: 31.3 % — ABNORMAL LOW (ref 39.0–52.0)
Hemoglobin: 9.7 g/dL — ABNORMAL LOW (ref 13.0–17.0)
Immature Granulocytes: 1 %
Lymphocytes Relative: 11 %
Lymphs Abs: 0.9 10*3/uL (ref 0.7–4.0)
MCH: 29.6 pg (ref 26.0–34.0)
MCHC: 31 g/dL (ref 30.0–36.0)
MCV: 95.4 fL (ref 80.0–100.0)
MONO ABS: 0.7 10*3/uL (ref 0.1–1.0)
MONOS PCT: 9 %
Neutro Abs: 5.9 10*3/uL (ref 1.7–7.7)
Neutrophils Relative %: 74 %
PLATELETS: 169 10*3/uL (ref 150–400)
RBC: 3.28 MIL/uL — AB (ref 4.22–5.81)
RDW: 14.2 % (ref 11.5–15.5)
WBC: 7.9 10*3/uL (ref 4.0–10.5)
nRBC: 0 % (ref 0.0–0.2)

## 2018-09-24 MED ORDER — RAMIPRIL 2.5 MG PO CAPS: 2.5000 mg | ORAL_CAPSULE | Freq: Every day | ORAL | 0 refills | Status: DC

## 2018-09-24 MED ORDER — SPIRONOLACTONE 25 MG PO TABS: 12.5000 mg | ORAL_TABLET | Freq: Every day | ORAL | 0 refills | Status: DC

## 2018-09-24 NOTE — Progress Notes (Signed)
Progress Note  Patient Name: [name] [name] Date of Encounter: 09/24/2018  Primary Cardiologist: Peter [name], MD   Subjective   Patient is feeling very well. He says he is going home today.   Inpatient Medications    Scheduled Meds: . digoxin  0.0625 mg Oral Daily  . [START ON 09/29/2018] furosemide  20 mg Oral Once per day on Sun Sat  . furosemide  40 mg Oral Once per day on Mon Tue Wed Thu Fri  . mesalamine  1,000 mg Rectal BID  . metoprolol succinate  100 mg Oral Daily  . ramipril  2.5 mg Oral Daily  . spironolactone  12.5 mg Oral Daily   Continuous Infusions:  PRN Meds: albuterol   Vital Signs    Vitals:   09/23/18 0920 09/23/18 1400 09/23/18 1411 09/23/18 2008  BP: 94/71 98/60 113/74 101/66  Pulse: (!) 150 65 81 67  Resp: 20   16  Temp:    98 F (36.7 C)  TempSrc:    Oral  SpO2:    96%  Weight:      Height:       No intake or output data in the 24 hours ending 09/24/18 1158 Filed Weights   09/21/18 1239  Weight: 60.8 kg    Telemetry    Atrial fibrillation in the 80's - Personally Reviewed  ECG    No new tracings - Personally Reviewed  Physical Exam   GEN: No acute distress.   Neck: No JVD Cardiac: irregularly irregular rhythm, no murmurs, rubs, or gallops.  Respiratory: Clear to auscultation bilaterally. GI: Soft, nontender, non-distended  MS: No edema; No deformity. Neuro:  Nonfocal  Psych: Normal affect   Labs    Chemistry Recent Labs  Lab 09/21/18 0731 09/22/18 0454 09/23/18 0511  NA 139 137 140  K 4.1 4.1 4.0  CL 107 107 108  CO2 24 23 25   GLUCOSE 101* 76 83  BUN 26* 17 19  CREATININE 1.13 0.97 1.04  CALCIUM 8.9 8.7* 8.9  PROT 6.6  --  5.8*  ALBUMIN 3.6  --  3.0*  AST 28  --  24  ALT 18  --  14  ALKPHOS 38  --  34*  BILITOT 0.9  --  0.8  GFRNONAA 58* >60 >60  GFRAA >60 >60 >60  ANIONGAP 8 7 7      Hematology Recent Labs  Lab 09/22/18 1854 09/23/18 0511 09/24/18 0554  WBC 8.9 8.5 7.9  RBC 3.29* 3.40* 3.28*    HGB 9.9* 10.1* 9.7*  HCT 31.2* 32.5* 31.3*  MCV 94.8 95.6 95.4  MCH 30.1 29.7 29.6  MCHC 31.7 31.1 31.0  RDW 14.0 14.3 14.2  PLT 191 189 169    Cardiac EnzymesNo results for input(s): TROPONINI in the last 168 hours. No results for input(s): TROPIPOC in the last 168 hours.   BNPNo results for input(s): BNP, PROBNP in the last 168 hours.   DDimer No results for input(s): DDIMER in the last 168 hours.   Radiology    No results found.  Cardiac Studies   none  Patient Profile     84 y.o. male with history of colon CA x 2 and GI bleeding. He also has a history of CAD (remote Inferolateral MI), CHF with LVEF 35 to 40% (echo 2012; ICD revised to PPM only in No 2016), paroxysmal atrial fibrillation, hypertension, GERD, iron deficiency anemia.   Admitted with bright red rectal bleeding and abdominal cramping.  Cardiology asked  to see for atrial fibrillation, rate control.  Assessment & Plan    Atrial fibrillation -Rate controlled at home with digoxin and metoprolol.  At time of consultation yesterday the patient had been off of his rate control medications due to low blood pressure.  He was started back on his medications yesterday. -Not anticoagulation given his GI bleeding history -He is continued on digoxin since admission.  Metoprolol succinate was resumed yesterday at his home dose of 100 mg daily -Blood pressure is soft but stable.  Family notes that his blood pressure is always low at home. -Rate is well controlled and the patient is feeling very well, hopes to go home  CHF -At home he is on Lasix 40 mg 5 days/week and 20 mg on the weekends. -His Lasix, ACE inhibitor and Aldactone were on hold due to labile blood pressures -Ramipril 2.5 mg and spinonolactone 12.5 mg (reduced doses from 10 mg of ramipril and 25 mg of Spironolactone at home) -Appears euvolemic  CAD -No anginal symptoms  GI bleeding -Hemoglobin is relatively stable, 9.7 today, down from 11.   -Gastroenterology is following and colonoscopy was done on 10/26 that showed mucosal ulceration (biopsied), stricture of the transverse colon, prior anastomoses with severe stenosis and ulceration.  Management per GI.  For questions or updates, please contact [place] Please consult [email or web address] for contact info under        Signed, Daune Perch, NP  09/24/2018, 11:58 AM

## 2018-09-24 NOTE — Discharge Summary (Signed)
Physician Discharge Summary  [name] [name] NOM:[id number] DOB: 1932-03-06 DOA: 09/21/2018  PCP: Crist Infante, MD  Admit date: 09/21/2018 Discharge date: 09/24/2018  Admitted From: Home Disposition: Home  Recommendations for Outpatient Follow-up:  1. Follow up with PCP in 1-2 weeks 2. Follow up with Cardiology Dr. [name] within 1 week 3. Follow up with Gastroenterology as an outpatient; Appointment scheduled for you on 10/09/18 at 10 AM 4. Please obtain CMP/CBC, Mag, Phos in one week 5. Please follow up on the following pending results: Colonoscopy biopsies  Home Health: No Equipment/Devices: None  Discharge Condition: Stable  CODE STATUS: FULL CODE Diet recommendation:   Brief/Interim Summary: HPI per Dr. Landis Gandy on 09/21/18 [name] [name] a 84 y.o.malewith medical history significant ofCABG in 9628 systolic heart failure, history of rectal cancer received radiation and chemotherapy and chronic atrial fibrillationdiverticulosis, history of colon cancer recent hospital admission 06/29/2018 for the same now readmitted with bright red bleeding per rectum along with some dark blood but not melena. He did have some abdominal cramping and nausea but no vomiting. He is not on a blood thinner. He has a history of atrial fibrillation. Daughter reports he takes a lot of Imodium up to 4 tablets daily for a long time. He denies any chest pain shortness of breath fever chills cough hematuria. Patient is admitted 8 of 2019 with a similar complaints has was seen by GI recommended no intervention and was discharged home. ED Course:He remained hemodynamically stable with a hemoglobin of 11.0 white count 9.9 platelet count 230 he had no further bleed since coming to the ER.  **Under went EGD which showed mucosal ulceration that was biopsied and then there is a stricture in the transverse colon unclear why the patient had mucosal ulceration but he did also have a end to side  ileocolonic anastomosis characterized by severe stenosis of his known chronic and the end of side: No colonic anastomosis was characterized by ulceration proximal and distal mostly.  There was diverticulosis in the entire examined colon and gastroenterology recommending observing the patient in the hospital for 1 more night and starting Canasa suppository twice daily as is unclear why patient had these ulcerations that could be ischemia, prolapse, or anastomotic dysfunction and doubtful of IBD.  Gastroenterology and continue Canasa while hospitalized however.  At discharge.  They will sort further treatment plans pending biopsy results and states that Imodium as needed is okay but avoid Lomotil for diarrhea.  Overnight on 10/26-10/27 the patient went into A. fib with uncontrolled RVR.  Cardiology was consulted for further evaluation recommendations and patient was placed back on his home metoprolol.  His heart rates improved and cardiology adjusted his blood pressure medications.  Patient was deemed medically stable to be discharged today and he will need to follow-up with primary care physician along with gastroenterology as well as cardiology outpatient setting  Discharge Diagnoses:  Active Problems:   A-fib (HCC)   Lower GI bleed   Ulceration of intestine   GI bleed  Lower GI bleed of unclear etiology but likely from mucosal ulcerations and unclear why patient has mucosal ulcerations -Gastroenterology took the patient for a colonoscopy and it showed findings as below -Gastroenterology started mesalamine suppositories thousand milligrams rectally twice daily -GI recommends stopping Canasa suppositories and stating that Imodium is okay but states not to give Lomotil -Biopsies are pending and further plan of care to be made as an outpatient -Patient has a history of diverticulosis -Hemoglobin/hematocrit went from 11.0/36.5 and is  now 10.1/32.5 -Repeat CBC  the same show that hemoglobin/hematocrit  dropped to 9.7/31.3 -PT evaluated and recommended no follow-up  Chronic Atrial Fibrillation now with RVR and uncontrolled rates, improved -Was previously rate controlled despite rates into the 150s and 160s last night  -not on anticoagulation due to GI bleed -Continue home digoxin and beta-blocker reduced dose but resumed home dose today -Digoxin level was 0.4 -Continue telemetry Monitoring -Cardiology consulted for further evaluation recommendations given uncontrolled rates uncontrolled rates and Dr. Harrington Challenger is recommending following for now  -His home metoprolol succinate was given and patient's rate is improved.  Per cardiology hold metoprolol tartrate and will not give that at discharge  History of ischemic cardiomyopathy s/p ICD, CAD and CABG  Chronic Systolic CHF with EF of 95-28% -Stable at this time and patient is Euvolemic  -Decreased the dose of beta-blocker since his blood pressure was soft but resumed home dose today -He takes Lasix, ACE inhibitor, and beta-blocker at home -Continued hold Lasix and ACE inhibitor since his blood pressure yesterday but cardiology consulted and recommended resuming slowly about half the dose for Sprionolactone and 1/4 the dose for Ramipril -Ramipril restarted 2.5 mg p.o. Daily (Takes 10 mg po Daily) and Spironolactone started 12.5 mg p.o. Daily (Takes 25 mg po Daily).  Furosemide restarted at 20 mg p.o. on Saturday and Sunday and then 40 mg M-F -Cardiology recommending continuing his current medications at this time and uptitrating as an outpatient  Hypertension -Pressures were soft with cardiology resuming medications and have resumed ramipril at a fourth dose at 2.5 mg p.o. daily and resume spironolactone at 12.5 mg p.o. daily and half the dose of his home dose. -They have also resumed furosemide at home dose -Continue with home metoprolol dose but recommending stopping metoprolol tartrate -Follow blood pressures and blood pressure is still  low -Cardiology to up titrate medications as an outpatient  Discharge Instructions Discharge Instructions    ([place]) Call MD:  Anytime you have any of the following symptoms: 1) 3 pound weight gain in 24 hours or 5 pounds in 1 week 2) shortness of breath, with or without a dry hacking cough 3) swelling in the hands, feet or stomach 4) if you have to sleep on extra pillows at night in order to breathe.   Complete by:  As directed    Call MD for:  difficulty breathing, headache or visual disturbances   Complete by:  As directed    Call MD for:  extreme fatigue   Complete by:  As directed    Call MD for:  hives   Complete by:  As directed    Call MD for:  persistant dizziness or light-headedness   Complete by:  As directed    Call MD for:  persistant nausea and vomiting   Complete by:  As directed    Call MD for:  redness, tenderness, or signs of infection (pain, swelling, redness, odor or green/yellow discharge around incision site)   Complete by:  As directed    Call MD for:  severe uncontrolled pain   Complete by:  As directed    Call MD for:  temperature >100.4   Complete by:  As directed    Diet - low sodium heart healthy   Complete by:  As directed    Discharge instructions   Complete by:  As directed    You were cared for by a hospitalist during your hospital stay. If you have any questions about your  discharge medications or the care you received while you were in the hospital after you are discharged, you can call the unit and ask to speak with the hospitalist on call if the hospitalist that took care of you is not available. Once you are discharged, your primary care physician will handle any further medical issues. Please note that NO REFILLS for any discharge medications will be authorized once you are discharged, as it is imperative that you return to your primary care physician (or establish a relationship with a primary care physician if you do not have one)  for your aftercare needs so that they can reassess your need for medications and monitor your lab values.  Follow up with PCP, Cardiology, and Gastroenterology. Take all medications as prescribed. If symptoms change or worsen please return to the ED for evaluation   Increase activity slowly   Complete by:  As directed      Allergies as of 09/24/2018      Reactions   Iron    Stomach issues   Lescol [fluvastatin Sodium]    Dizziness   Pentazocine Lactate    unknown   Vytorin [ezetimibe-simvastatin]    Leg cramps   Aspirin    Bleeding      Medication List    STOP taking these medications   diphenoxylate-atropine 2.5-0.025 MG tablet Commonly known as:  LOMOTIL   metoprolol tartrate 50 MG tablet Commonly known as:  LOPRESSOR     TAKE these medications   BESIVANCE 0.6 % Susp Generic drug:  Besifloxacin HCl Place 1 drop into the right eye 3 (three) times daily.   cholecalciferol 1000 units tablet Commonly known as:  VITAMIN D Take 1,000 Units by mouth daily.   digoxin 0.125 MG tablet Commonly known as:  LANOXIN TAKE 1/2 TABLET EVERY DAY   DUREZOL 0.05 % Emul Generic drug:  Difluprednate Place 1 drop into the right eye 3 (three) times daily.   furosemide 40 MG tablet Commonly known as:  LASIX Take 40 mg daily 5 days a week and 20 mg on Sat and Sun What changed:    how much to take  how to take this  when to take this  additional instructions   metoprolol succinate 100 MG 24 hr tablet Commonly known as:  TOPROL-XL TAKE 1 TABLET EVERY DAY   multivitamins ther. w/minerals Tabs tablet Take 1 tablet by mouth daily.   nitroGLYCERIN 0.4 MG SL tablet Commonly known as:  NITROSTAT Place 1 tablet (0.4 mg total) under the tongue every 5 (five) minutes as needed for chest pain.   PROAIR HFA 108 (90 Base) MCG/ACT inhaler Generic drug:  albuterol Inhale 2 puffs into the lungs every 4 (four) hours as needed for shortness of breath.   ramipril 2.5 MG  capsule Commonly known as:  ALTACE Take 1 capsule (2.5 mg total) by mouth daily. Start taking on:  09/25/2018 What changed:    medication strength  how much to take   spironolactone 25 MG tablet Commonly known as:  ALDACTONE Take 0.5 tablets (12.5 mg total) by mouth daily. Start taking on:  09/25/2018 What changed:  how much to take   temazepam 7.5 MG capsule Commonly known as:  RESTORIL Take 1 capsule (7.5 mg total) by mouth at bedtime as needed for sleep.   vitamin B-12 1000 MCG tablet Commonly known as:  CYANOCOBALAMIN Take 1,000 mcg by mouth daily.      Follow-up Information    Willia Craze, NP Follow up  on 10/09/2018.   Specialty:  Gastroenterology Why:  at 10 am Contact information: [place] [address] [phone]        [facility], Angela Nicole, [place] Follow up.   Specialties:  Physician Assistant, Cardiology, Radiology Why:  Cardiology hospital follow up with Dr. Doug Sou PA on 10/08/18 at 9:00. Contact information: [address] [place] [address] [phone]        Crist Infante, MD. Call.   Specialty:  Internal Medicine Why:  Follow up within 1 week Contact information: [place] [address] [phone]        [name], Peter M, MD .   Specialty:  Cardiology Contact information: [address] [place] [address] [phone]          Allergies  Allergen Reactions  . Iron     Stomach issues  . Lescol [Fluvastatin Sodium]     Dizziness  . Pentazocine Lactate     unknown  . Vytorin [Ezetimibe-Simvastatin]     Leg cramps   . Aspirin     Bleeding    Consultations:  Cardiology  Gastroenterology  Procedures/Studies: No results found.  Subjective: Seen and examined at bedside and was feeling well.  Denied any chest pain, lightheadedness or dizziness.  States he has had no more episodes of bleeding.  No nausea or vomiting.  Ready to go home.  Discharge  Exam: Vitals:   09/23/18 1411 09/23/18 2008  BP: 113/74 101/66  Pulse: 81 67  Resp:  16  Temp:  98 F (36.7 C)  SpO2:  96%   Vitals:   09/23/18 0920 09/23/18 1400 09/23/18 1411 09/23/18 2008  BP: 94/71 98/60 113/74 101/66  Pulse: (!) 150 65 81 67  Resp: 20   16  Temp:    98 F (36.7 C)  TempSrc:    Oral  SpO2:    96%  Weight:      Height:       General: Pt is alert, awake, not in acute distress Cardiovascular: Irregularly irregular, S1/S2 +, no rubs, no gallops Respiratory: Diminished bilaterally, no wheezing, no rhonchi Abdominal: Soft, NT, ND, bowel sounds + Extremities: no edema, no cyanosis  The results of significant diagnostics from this hospitalization (including imaging, microbiology, ancillary and laboratory) are listed below for reference.    Microbiology: No results found for this or any previous visit (from the past 240 hour(s)).   Labs: BNP (last 3 results) Recent Labs    08/20/18 0942  BNP 588.5*   Basic Metabolic Panel: Recent Labs  Lab 09/21/18 0731 09/22/18 0454 09/23/18 0511  NA 139 137 140  K 4.1 4.1 4.0  CL 107 107 108  CO2 24 23 25   GLUCOSE 101* 76 83  BUN 26* 17 19  CREATININE 1.13 0.97 1.04  CALCIUM 8.9 8.7* 8.9  MG  --   --  1.8  PHOS  --   --  2.7   Liver Function Tests: Recent Labs  Lab 09/21/18 0731 09/23/18 0511  AST 28 24  ALT 18 14  ALKPHOS 38 34*  BILITOT 0.9 0.8  PROT 6.6 5.8*  ALBUMIN 3.6 3.0*   No results for input(s): LIPASE, AMYLASE in the last 168 hours. No results for input(s): AMMONIA in the last 168 hours. CBC: Recent Labs  Lab 09/21/18 0731 09/22/18 0454 09/22/18 1854 09/23/18 0511 09/24/18 0554  WBC 9.9 7.4 8.9 8.5 7.9  NEUTROABS  --   --  7.6 6.8 5.9  HGB 11.0* 10.0* 9.9* 10.1* 9.7*  HCT 36.5* 32.6* 31.2* 32.5* 31.3*  MCV 97.1 97.0 94.8 95.6 95.4  PLT 230 194 191 189 169   Cardiac Enzymes: No results for input(s): CKTOTAL, CKMB, CKMBINDEX, TROPONINI in the last 168 hours. BNP: Invalid  input(s): POCBNP CBG: No results for input(s): GLUCAP in the last 168 hours. D-Dimer No results for input(s): DDIMER in the last 72 hours. Hgb A1c No results for input(s): HGBA1C in the last 72 hours. Lipid Profile No results for input(s): CHOL, HDL, LDLCALC, TRIG, CHOLHDL, LDLDIRECT in the last 72 hours. Thyroid function studies No results for input(s): TSH, T4TOTAL, T3FREE, THYROIDAB in the last 72 hours.  Invalid input(s): FREET3 Anemia work up No results for input(s): VITAMINB12, FOLATE, FERRITIN, TIBC, IRON, RETICCTPCT in the last 72 hours. Urinalysis    Component Value Date/Time   COLORURINE YELLOW 10/14/2013 1510   APPEARANCEUR CLEAR 10/14/2013 1510   LABSPEC 1.017 10/14/2013 1510   PHURINE 6.0 10/14/2013 1510   GLUCOSEU NEGATIVE 10/14/2013 1510   HGBUR NEGATIVE 10/14/2013 1510   BILIRUBINUR NEGATIVE 10/14/2013 1510   KETONESUR NEGATIVE 10/14/2013 1510   PROTEINUR 30 (A) 10/14/2013 1510   UROBILINOGEN 1.0 10/14/2013 1510   NITRITE NEGATIVE 10/14/2013 1510   LEUKOCYTESUR NEGATIVE 10/14/2013 1510   Sepsis Labs Invalid input(s): PROCALCITONIN,  WBC,  LACTICIDVEN Microbiology No results found for this or any previous visit (from the past 240 hour(s)).  Time coordinating discharge: 35 minutes  SIGNED:  Kerney Elbe, DO [facility] 09/24/2018, 12:52 PM Pager is on [place]  If 7PM-7AM, please contact night-coverage [email or web address] Password [email or web address]

## 2018-09-24 NOTE — Progress Notes (Signed)
     [facility] Progress Note   Chief Complaint:   Lower GI bleeding  SUBJECTIVE:   Bleeding resolved, stools brown and he wants to go home. Awaiting Cardiology.     ASSESSMENT AND PLAN:   1. Lower GI bleeding  / ulceration with stigmata of recent bleeding found in rectum and sigmoid. Etiology unclear, ? Impaction but he endorses chronic loose stools -bleeding has stopped. Stools brown -biopsies pending -started empirically on Canasa supp. If discharged before  colon biopsies ready then can hold off on Rx of Canasa since it can be $$$. We can restart if necessary once biopsies reviewed.  -Imodium okay as needed for loose stools. No lomotil -I made him a follow up with me on 11/12 at 10am  2. 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 Diverticulosis  4. Afib with RVR, uncontrolled rates. Not anticoagulated.  -awaiting Cardiology evaluation  5. [place] anemia. Hgb fell slightly over a gram with this bleed. Today it is 9.7.     OBJECTIVE:     Vital signs in last 24 hours: Temp:  [98 F (36.7 C)] 98 F (36.7 C) (10/27 2008) Pulse Rate:  [65-81] 67 (10/27 2008) Resp:  [16] 16 (10/27 2008) BP: (98-113)/(60-74) 101/66 (10/27 2008) SpO2:  [96 %] 96 % (10/27 2008) Last BM Date: 09/23/18 General:   Alert, well-developed male in NAD EENT:  Normal hearing, non icteric sclera, conjunctive pink.  Heart:  Regular rate, irreg rhythm  No lower extremity edema   Pulm: Normal respiratory effort Abdomen:  Soft, nondistended, nontender.  Normal bowel sounds, no masses felt.       Neurologic:  Alert and  oriented x4;  grossly normal neurologically. Psych:  Pleasant, cooperative.  Normal mood and affect.   Intake/Output from previous day: 10/27 0701 - 10/28 0700 In: 70 [P.O.:70] Out: -  Intake/Output this shift: No intake/output data recorded.  Lab Results: Recent Labs    09/22/18 1854 09/23/18 0511 09/24/18 0554  WBC 8.9  8.5 7.9  HGB 9.9* 10.1* 9.7*  HCT 31.2* 32.5* 31.3*  PLT 191 189 169   BMET Recent Labs    09/22/18 0454 09/23/18 0511  NA 137 140  K 4.1 4.0  CL 107 108  CO2 23 25  GLUCOSE 76 83  BUN 17 19  CREATININE 0.97 1.04  CALCIUM 8.7* 8.9   LFT Recent Labs    09/23/18 0511  PROT 5.8*  ALBUMIN 3.0*  AST 24  ALT 14  ALKPHOS 34*  BILITOT 0.8    Discharge Planning Diet: regular Anticoagulation and antiplatelets: NA Follow up: with me on 10/09/18 at 10am     Active Problems:   A-fib [facility])   Lower GI bleed   Ulceration of intestine   GI bleed     LOS: 1 day   [name] [name] ,NP 09/24/2018, 11:05 AM

## 2018-09-27 ENCOUNTER — Other Ambulatory Visit: Payer: Self-pay

## 2018-09-27 NOTE — Patient Outreach (Signed)
Patient triggered Red on [facility] Discharge Dashboard, notification sent to:  Davina Green, RN 

## 2018-09-27 NOTE — Patient Outreach (Signed)
[facility]) Care Management  09/27/2018  PETER KEYWORTH 04/16/1932 [id number]  EMMI: general discharge red alert Referral date: 09/27/18 Referral reason: unfilled prescriptions Insurance: [employer]  Day # 1  Telephone call to patient regarding EMMI general discharge red alert. HIPAA verified. Explained reason for call. Patient states his prescriptions have been filled and he is taking his medication as prescribed.  Patient states, " everybody and their brother has been calling her.   You get sick and they worry you to death." Patient states he has a follow up with his primary MD on tomorrow, 09/28/18.  Patient states he has a follow up appointment with his gastroenterologist on 11/12/ 19.  Patient reports he has not had any bleeding since Saturday 10/ 26/19. Patient states he has had 2 gastrointestinal bleeds in the past 3-4 months. He states, " I think they found out what was causing this time."  Patient unable to articulate what the findings of his colonoscopy were. Patient states he will discuss this at his gastroenterology appointment.   RNCM advised patient to contact his doctor as soon as possible if he notices any new symptoms. Advised to call 911 for more severe symptoms. Patient states, " I know I've been through a lot."  RNCM gave patient contact  phone number for the 24 hour nurse advise line.   PLAN: RNCM will close patient due to patient being assessed and having no further needs.  RNCM will send closure notification to patients doctor.   Quinn Plowman RN,BSN,CCM [facility] Telephonic  [phone]

## 2018-09-28 DIAGNOSIS — J3089 Other allergic rhinitis: Secondary | ICD-10-CM | POA: Diagnosis not present

## 2018-09-28 DIAGNOSIS — I480 Paroxysmal atrial fibrillation: Secondary | ICD-10-CM | POA: Diagnosis not present

## 2018-09-28 DIAGNOSIS — I509 Heart failure, unspecified: Secondary | ICD-10-CM | POA: Diagnosis not present

## 2018-09-28 DIAGNOSIS — I10 Essential (primary) hypertension: Secondary | ICD-10-CM | POA: Diagnosis not present

## 2018-09-28 DIAGNOSIS — R634 Abnormal weight loss: Secondary | ICD-10-CM | POA: Diagnosis not present

## 2018-09-28 DIAGNOSIS — K922 Gastrointestinal hemorrhage, unspecified: Secondary | ICD-10-CM | POA: Diagnosis not present

## 2018-09-28 DIAGNOSIS — Z6820 Body mass index (BMI) 20.0-20.9, adult: Secondary | ICD-10-CM | POA: Diagnosis not present

## 2018-10-03 NOTE — Progress Notes (Signed)
Biopsies showed acute and chronic inflammation No cancer  1) is he having constipation or diarrhea? 2) is he having bleeding? 3) Has he had a blood count checked since leaving hospital (may have seen Dr. Joylene Draft) - will need records if so and if not have him doing a CBC 4) He sees Nevin Bloodgood next week

## 2018-10-08 ENCOUNTER — Ambulatory Visit: Payer: Medicare HMO | Admitting: Physician Assistant

## 2018-10-09 ENCOUNTER — Ambulatory Visit: Payer: Medicare HMO | Admitting: Adult Health

## 2018-10-09 ENCOUNTER — Encounter: Payer: Self-pay | Admitting: Adult Health

## 2018-10-09 ENCOUNTER — Encounter: Payer: Self-pay | Admitting: Nurse Practitioner

## 2018-10-09 ENCOUNTER — Ambulatory Visit: Payer: Medicare HMO | Admitting: Nurse Practitioner

## 2018-10-09 VITALS — BP 102/62 | HR 67 | Ht 69.0 in | Wt 133.4 lb

## 2018-10-09 VITALS — BP 104/70 | HR 66 | Ht 69.0 in | Wt 132.2 lb

## 2018-10-09 DIAGNOSIS — I4891 Unspecified atrial fibrillation: Secondary | ICD-10-CM | POA: Diagnosis not present

## 2018-10-09 DIAGNOSIS — I10 Essential (primary) hypertension: Secondary | ICD-10-CM | POA: Diagnosis not present

## 2018-10-09 DIAGNOSIS — I5022 Chronic systolic (congestive) heart failure: Secondary | ICD-10-CM

## 2018-10-09 DIAGNOSIS — K529 Noninfective gastroenteritis and colitis, unspecified: Secondary | ICD-10-CM | POA: Diagnosis not present

## 2018-10-09 DIAGNOSIS — K922 Gastrointestinal hemorrhage, unspecified: Secondary | ICD-10-CM

## 2018-10-09 DIAGNOSIS — D649 Anemia, unspecified: Secondary | ICD-10-CM

## 2018-10-09 NOTE — Progress Notes (Signed)
Cardiology Office Note   Date:  10/09/2018   ID:  [name] [name], DOB 02-24-1932, MRN [id number]  PCP:  Crist Infante, MD  Cardiologist:  Dr.Jordan    Chief Complaint  Patient presents with  . Hospitalization Follow-up     History of Present Illness: [name] [name] is a 84 y.o. male who presents for ongoing assessment and management of CAD, ICM s/p ICD, who was seen on consultation for atrial fibrillation. Echo revealed EF of 35%-40%., ICD revised to PPM only in November 2016. He is not a candidate for anticoagulation due to GIB. He was continued on digoxin and metoprolol. He was continued on diuretics. ACE was held due to labile BP.  He comes today feeling much better. His weight is stable. He denies chest pain or dyspnea.    Past Medical History:  Diagnosis Date  . CAD (coronary artery disease)    Old inferolateral MI s/p CABG in 2002  . CAP (community acquired pneumonia) 10/03/2011  . Cardiomyopathy    s/p ICD 09/30/08; EF 35 to 40% per echo in October 2021  . Chronic systolic CHF (congestive heart failure) ([place]) 07/24/2013  . Colon cancer ([place])    s/p chemo, RT, sugery  . Colorectal anastomotic stricture   . Diverticulosis   . ED (erectile dysfunction)   . GERD (gastroesophageal reflux disease)    rarely  . HTN (hypertension)   . ICD (implantable cardiac defibrillator) discharge October 2012   ICD shock for afib with RVR  . Iron deficiency anemia   . Paroxysmal atrial fibrillation (HCC)    controlled with amiodarone, not a coumadin candidate due to GI bleeding    Past Surgical History:  Procedure Laterality Date  . APPENDECTOMY    . BACK SURGERY    . BIOPSY  09/22/2018   Procedure: BIOPSY;  Surgeon: Gatha Mayer, MD;  Location: [facility];  Service: Endoscopy;;  . CARDIAC CATHETERIZATION  11/07/2000   EF 32%  . CARDIAC DEFIBRILLATOR PLACEMENT  09/30/08   MDT dual chamber ICD placed by Dr Rayann Heman  . CARDIOVASCULAR STRESS TEST  08/06/2008   EF 30%  .  CHOLECYSTECTOMY    . COLON RESECTION     x2  . COLONOSCOPY     multiple  . COLONOSCOPY N/A 09/22/2018   Procedure: COLONOSCOPY;  Surgeon: Gatha Mayer, MD;  Location: [facility];  Service: Endoscopy;  Laterality: N/A;  . CORONARY ARTERY BYPASS GRAFT    . EP IMPLANTABLE DEVICE N/A 10/15/2015   Procedure:  PPM Generator Changeout;  Surgeon: [name], MD;  Location: [place] [facility];  Service: Cardiovascular;  Laterality: N/A;  . HERNIA REPAIR    . US ECHOCARDIOGRAPHY  08/11/2008   EF 30-35%  . VASECTOMY       Current Outpatient Medications  Medication Sig Dispense Refill  . BESIVANCE 0.6 % SUSP Place 1 drop into the right eye 3 (three) times daily.  1  . cholecalciferol (VITAMIN D) 1000 UNITS tablet Take 1,000 Units by mouth daily.     . digoxin (LANOXIN) 0.125 MG tablet TAKE 1/2 TABLET EVERY DAY (Patient taking differently: Take 0.0625 mg by mouth daily. ) 45 tablet 6  . DUREZOL 0.05 % EMUL Place 1 drop into the right eye 3 (three) times daily.  1  . furosemide (LASIX) 40 MG tablet Take 40 mg daily 5 days a week and 20 mg on Sat and Sun (Patient taking differently: Take 20-40 mg by mouth daily. Take 40 mg daily Monday-Friday  and 20 mg on Saturday & Sunday) 90 tablet 3  . metoprolol succinate (TOPROL-XL) 100 MG 24 hr tablet TAKE 1 TABLET EVERY DAY (Patient taking differently: Take 100 mg by mouth daily. ) 90 tablet 3  . Multiple Vitamins-Minerals (MULTIVITAMINS THER. W/MINERALS) TABS Take 1 tablet by mouth daily.      . nitroGLYCERIN (NITROSTAT) 0.4 MG SL tablet Place 1 tablet (0.4 mg total) under the tongue every 5 (five) minutes as needed for chest pain. 25 tablet 6  . PROAIR HFA 108 (90 BASE) MCG/ACT inhaler Inhale 2 puffs into the lungs every 4 (four) hours as needed for shortness of breath.     . ramipril (ALTACE) 2.5 MG capsule Take 1 capsule (2.5 mg total) by mouth daily. 30 capsule 0  . spironolactone (ALDACTONE) 25 MG tablet Take 0.5 tablets (12.5 mg total) by mouth  daily. 30 tablet 0  . temazepam (RESTORIL) 7.5 MG capsule Take 1 capsule (7.5 mg total) by mouth at bedtime as needed for sleep. 30 capsule 0  . vitamin B-12 (CYANOCOBALAMIN) 1000 MCG tablet Take 1,000 mcg by mouth daily.     No current facility-administered medications for this visit.     Allergies:   Iron; Lescol [fluvastatin sodium]; Pentazocine lactate; Vytorin [ezetimibe-simvastatin]; and Aspirin    Social History:  The patient  reports that he has quit smoking. His smoking use included cigars. He has never used smokeless tobacco. He reports that he does not drink alcohol or use drugs.   Family History:  The patient's family history includes Colon cancer in his father; Colon polyps in his brother; Diabetes in his sister; Pancreatic cancer (age of onset: 11) in his brother.    ROS: All other systems are reviewed and negative. Unless otherwise mentioned in H&P    PHYSICAL EXAM: VS:  BP 102/62   Pulse 67   Ht 5\' 9"  (1.753 m)   Wt 133 lb 6.4 oz (60.5 kg)   BMI 19.70 kg/m  , BMI Body mass index is 19.7 kg/m. GEN: Well nourished, well developed, in no acute distress, thin  HEENT: normal Neck: no JVD, carotid bruits, or masses Cardiac: IRRR; no murmurs, rubs, or gallops,no edema  Respiratory:  Clear to auscultation bilaterally, normal work of breathing GI: soft, nontender, nondistended, + BS MS: no deformity or atrophy Skin: warm and dry, no rash Neuro:  Strength and sensation are intact Psych: euthymic mood, full affect   EKG:  Atrial fib with paced rhythm. Rate of 67 bpm.  Recent Labs: 08/20/2018: BNP 479.6 09/23/2018: ALT 14; BUN 19; Creatinine, Ser 1.04; Magnesium 1.8; Potassium 4.0; Sodium 140 09/24/2018: Hemoglobin 9.7; Platelets 169    Lipid Panel No results found for: CHOL, TRIG, HDL, CHOLHDL, VLDL, LDLCALC, LDLDIRECT    Wt Readings from Last 3 Encounters:  10/09/18 133 lb 6.4 oz (60.5 kg)  10/09/18 132 lb 4 oz (60 kg)  09/21/18 134 lb 0.6 oz (60.8 kg)       Other studies Reviewed: Echocardiogram 2016-01-16 - Left ventricle: Inferior wall hypokinesis The cavity size was   moderately dilated. Wall thickness was increased in a pattern of   mild LVH. Systolic function was mildly to moderately reduced. The   estimated ejection fraction was in the range of 40% to 45%. - Aortic valve: There was mild regurgitation. - Mitral valve: There was moderate regurgitation. - Left atrium: The atrium was moderately dilated. - Right ventricle: The cavity size was mildly dilated. - Right atrium: The atrium was mildly dilated. -  Atrial septum: No defect or patent foramen ovale was identified. - Pulmonary arteries: PA peak pressure: 71 mm Hg (S).  ASSESSMENT AND PLAN:  1. Atrial fibrillation:  Rate is controlled on digoxin and metoprolol. Not an anticoagulation candidate due to GIB.   2. Chronic systolic CHF: He has no evidence of fluid overload. Continue spironolactone and lasix per his regimen.   3. Hypertension: Continue Ramipril 2.5 mg daily along with the BB and spironolactone.    Current medicines are reviewed at length with the patient today.    Labs/ tests ordered today include: None   Phill Myron. West Pugh, ANP, AACC   10/09/2018 3:03 PM    [address] Office [phone] Fax [phone]

## 2018-10-09 NOTE — Patient Instructions (Signed)
If you are age 82 or older, your body mass index should be between 23-30. Your Body mass index is 19.53 kg/m. If this is out of the aforementioned range listed, please consider follow up with your Primary Care Provider.  If you are age 71 or younger, your body mass index should be between 19-25. Your Body mass index is 19.53 kg/m. If this is out of the aformentioned range listed, please consider follow up with your Primary Care Provider.   Take Lomotil one twice daily.  Will call to check on you in a few days.  Thank you for choosing me and [facility].   Tye Savoy, NP

## 2018-10-09 NOTE — Patient Instructions (Signed)
Follow-Up: You will need a follow up appointment in [place].  You may see  DR [name], Kathryn Lawrence, DNP, ANP -or- one of the following Advanced Practice Providers on your designated Care Team:    . Almyra Deforest, PA-C . Fabian Sharp, PA-C  Medication Instructions:  NO CHANGES- Your physician recommends that you continue on your current medications as directed. Please refer to the Current Medication list given to you today.  If you need a refill on your cardiac medications before your next appointment, please call your pharmacy.  Labwork: If you have labs (blood work) drawn today and your tests are completely normal, you will receive your results ONLY by: . MyChart Message (if you have MyChart) -OR- . A paper copy in the mail  At [facility], you and your health needs are our priority.  As part of our continuing mission to provide you with exceptional heart care, we have created designated Provider Care Teams.  These Care Teams include your primary Cardiologist (physician) and Advanced Practice Providers (APPs -  Physician Assistants and Nurse Practitioners) who all work together to provide you with the care you need, when you need it.  Thank you for choosing [facility] at [facility]!!

## 2018-10-09 NOTE — Progress Notes (Signed)
ASSESSMENT / PLAN:    84 year old male recently hospitalized a second time with painless hematochezia.  Underwent inpatient colonoscopy during this last admission with findings of ulceration in the rectum and sigmoid as well as severe stenosis of his ileocolonic anastomosis (known and chronic).  Distal sigmoid biopsies suggested acute and chronic inflammation.  -Significance of biopsies not really clear.  This could be IBD but also secondary to impaction (though he reports chronic diarrhea).  Regardless, patient has had loose stools since bowel resection in the 1980s manageable with anti-diarrheal meds a couple of times a day.  Given his age and unclear clinical significance of the biopsies I would be disinclined to start him on any IBD meds, especially when diarrhea seems to be easily managed with twice daily Lomotil but will defer to his primary GI, Dr. Carlean Purl.   -Lomotil was stopped upon hospital discharge.  Patient is taking Imodium twice a day but it is not working nearly as well.  It is okay to resume Lomotil twice daily. -Advised patient that he should contact us if having increased loose stool despite Lomotil.   Normocytic anemia.  Improved Hemoglobin did decline some during recent admission for hematochezia.  On admission it was 11, down to 9.7 at time of discharge on 09/24/2018.  He had a repeat CBC done but PCP several days ago.  Hgb now 11.9.     HPI:     Chief Complaint:   Hospital follow-up.   Patient is an 84 year old male with chronic Afib  /CAD remote CABG  /systolic heart failure /ischemic cardiomyopathy / ICD placement / colon cancer x 2.  He is known to Dr. Carlean Purl and was recently hospitalized (a second time) with painless hematochezia (first episode was in early August).  Inpatient colonoscopy with biopsies was done with findings below.    Colonoscopy two the ileocolonic anastomosis on 09/22/18 Mucosal ulceration of rectum and sigmoid. Biopsied. -  Stricture in the transverse colon. - End-to-side ileo-colonic anastomosis, characterized by severe stenosis. (Known and chronic) - Patent end-to-side colo-colonic anastomosis, characterized by ulceration. Proximal and distal (mostly) - Diverticulosis in the entire examined colon. - The examination was otherwise normal on direct and retroflexion views.  Colon, biopsy, distal sigmoid - COLONIC MUCOSA WITH ULCERATION AND GRANULATION TISSUE WITH ACUTE AND CHRONIC INFLAMMATION - NO MALIGNANCY IDENTIFIED  Patient is here for hospital follow-up.  No further rectal bleeding.  He was started empirically on Canasa suppositories in the hospital but this was not continued at discharge . Patient has had loose stools since the 1980s for which he has taken antidiarrheals with good response for many years.    While in the hospital however his Lomotil was discontinued.  Since then patient has been taking Imodium which does not work nearly as well.  He had 4 bowel movements yesterday on twice daily Lomotil.    Data Reviewed:  Hgb  >>>> 9.7 on 10/28 , down from baseline of around 11.  Follow-up labs 09/28/2018- hemoglobin better at 11.9 BMET unremarkable except for mildly elevated BUN of 29.  Creatinine 1.4.  ROS: No chest pain, no shortness of breath, no urinary symptoms.  No fevers or unexplained weight loss.  Past Medical History:  Diagnosis Date  . CAD (coronary artery disease)    Old inferolateral MI s/p CABG in 2002  . CAP (community acquired pneumonia) 10/03/2011  . Cardiomyopathy    s/p ICD 09/30/08;  EF 35 to 40% per echo in October 2021  . Chronic systolic CHF (congestive heart failure) ([place]) 07/24/2013  . Colon cancer ([place])    s/p chemo, RT, sugery  . Colorectal anastomotic stricture   . Diverticulosis   . ED (erectile dysfunction)   . GERD (gastroesophageal reflux disease)    rarely  . HTN (hypertension)   . ICD (implantable cardiac defibrillator) discharge October 2012   ICD shock for  afib with RVR  . Iron deficiency anemia   . Paroxysmal atrial fibrillation (HCC)    controlled with amiodarone, not a coumadin candidate due to GI bleeding     Past Surgical History:  Procedure Laterality Date  . APPENDECTOMY    . BACK SURGERY    . BIOPSY  09/22/2018   Procedure: BIOPSY;  Surgeon: Gatha Mayer, MD;  [facility];  Service: Endoscopy;;  . CARDIAC CATHETERIZATION  11/07/2000   EF 32%  . CARDIAC DEFIBRILLATOR PLACEMENT  09/30/08   MDT dual chamber ICD placed by Dr Rayann Heman  . CARDIOVASCULAR STRESS TEST  08/06/2008   EF 30%  . CHOLECYSTECTOMY    . COLON RESECTION     x2  . COLONOSCOPY     multiple  . COLONOSCOPY N/A 09/22/2018   Procedure: COLONOSCOPY;  Surgeon: Gatha Mayer, MD;  [facility];  Service: Endoscopy;  Laterality: N/A;  . CORONARY ARTERY BYPASS GRAFT    . EP IMPLANTABLE DEVICE N/A 10/15/2015   Procedure:  PPM Generator Changeout;  Surgeon: [name], MD;  Location: [place] [facility];  Service: Cardiovascular;  Laterality: N/A;  . HERNIA REPAIR    . US ECHOCARDIOGRAPHY  08/11/2008   EF 30-35%  . VASECTOMY     Family History  Problem Relation Age of Onset  . Colon cancer Father   . Diabetes Sister   . Colon polyps Brother   . Pancreatic cancer Brother 30   Social History   Tobacco Use  . Smoking status: Former Smoker    Types: Cigars  . Smokeless tobacco: Never Used  Substance Use Topics  . Alcohol use: No  . Drug use: No   Current Outpatient Medications  Medication Sig Dispense Refill  . BESIVANCE 0.6 % SUSP Place 1 drop into the right eye 3 (three) times daily.  1  . cholecalciferol (VITAMIN D) 1000 UNITS tablet Take 1,000 Units by mouth daily.     . digoxin (LANOXIN) 0.125 MG tablet TAKE 1/2 TABLET EVERY DAY (Patient taking differently: Take 0.0625 mg by mouth daily. ) 45 tablet 6  . DUREZOL 0.05 % EMUL Place 1 drop into the right eye 3 (three) times daily.  1  . furosemide (LASIX) 40 MG tablet Take 40 mg  daily 5 days a week and 20 mg on Sat and Sun (Patient taking differently: Take 20-40 mg by mouth daily. Take 40 mg daily Monday-Friday and 20 mg on Saturday & Sunday) 90 tablet 3  . metoprolol succinate (TOPROL-XL) 100 MG 24 hr tablet TAKE 1 TABLET EVERY DAY (Patient taking differently: Take 100 mg by mouth daily. ) 90 tablet 3  . Multiple Vitamins-Minerals (MULTIVITAMINS THER. W/MINERALS) TABS Take 1 tablet by mouth daily.      . nitroGLYCERIN (NITROSTAT) 0.4 MG SL tablet Place 1 tablet (0.4 mg total) under the tongue every 5 (five) minutes as needed for chest pain. 25 tablet 6  . PROAIR HFA 108 (90 BASE) MCG/ACT inhaler Inhale 2 puffs into the lungs every 4 (four) hours as needed  for shortness of breath.     . ramipril (ALTACE) 2.5 MG capsule Take 1 capsule (2.5 mg total) by mouth daily. 30 capsule 0  . spironolactone (ALDACTONE) 25 MG tablet Take 0.5 tablets (12.5 mg total) by mouth daily. 30 tablet 0  . temazepam (RESTORIL) 7.5 MG capsule Take 1 capsule (7.5 mg total) by mouth at bedtime as needed for sleep. 30 capsule 0  . vitamin B-12 (CYANOCOBALAMIN) 1000 MCG tablet Take 1,000 mcg by mouth daily.     No current facility-administered medications for this visit.    Allergies  Allergen Reactions  . Iron     Stomach issues  . Lescol [Fluvastatin Sodium]     Dizziness  . Pentazocine Lactate     unknown  . Vytorin [Ezetimibe-Simvastatin]     Leg cramps   . Aspirin     Bleeding       Physical Exam:    Wt Readings from Last 3 Encounters:  10/09/18 132 lb 4 oz (60 kg)  09/21/18 134 lb 0.6 oz (60.8 kg)  08/20/18 153 lb (69.4 kg)    BP 104/70   Pulse 66   Ht 5\' 9"  (1.753 m)   Wt 132 lb 4 oz (60 kg)   BMI 19.53 kg/m  Constitutional:  Pleasant male in no acute distress. Psychiatric: Normal mood and affect. Behavior is normal. EENT: Pupils normal.  Conjunctivae are normal. No scleral icterus. Neck supple.  Cardiovascular: Normal rate, regular rhythm. No  edema Pulmonary/chest: Effort normal and breath sounds normal. No wheezing, rales or rhonchi. Abdominal: Soft, nondistended, nontender. Bowel sounds active throughout. There are no masses palpable.  Neurological: Alert and oriented to person place and time. Skin: Skin is warm and dry. No rashes noted.  Tye Savoy, NP  10/09/2018, 10:06 AM  Cc: Crist Infante, MD

## 2018-10-10 ENCOUNTER — Encounter: Payer: Self-pay | Admitting: Nurse Practitioner

## 2018-11-22 ENCOUNTER — Ambulatory Visit (INDEPENDENT_AMBULATORY_CARE_PROVIDER_SITE_OTHER): Payer: Medicare HMO

## 2018-11-22 DIAGNOSIS — R001 Bradycardia, unspecified: Secondary | ICD-10-CM

## 2018-11-22 DIAGNOSIS — I255 Ischemic cardiomyopathy: Secondary | ICD-10-CM | POA: Diagnosis not present

## 2018-11-22 NOTE — Progress Notes (Signed)
Remote pacemaker transmission.   

## 2018-11-25 LAB — CUP PACEART REMOTE DEVICE CHECK
Battery Remaining Longevity: 134 mo
Battery Remaining Percentage: 95.5 %
Battery Voltage: 3.02 V
Brady Statistic RV Percent Paced: 6.3 %
Date Time Interrogation Session: 20191225070014
Implantable Lead Implant Date: 20091103
Implantable Lead Location: 753859
Implantable Lead Location: 753860
Implantable Lead Model: 5076
Implantable Lead Model: 6947
Lead Channel Setting Pacing Amplitude: 2.5 V
Lead Channel Setting Pacing Pulse Width: 0.4 ms
Lead Channel Setting Sensing Sensitivity: 2 mV
MDC IDC LEAD IMPLANT DT: 20091103
MDC IDC MSMT LEADCHNL RV IMPEDANCE VALUE: 390 Ohm
MDC IDC MSMT LEADCHNL RV PACING THRESHOLD AMPLITUDE: 0.75 V
MDC IDC MSMT LEADCHNL RV PACING THRESHOLD PULSEWIDTH: 0.4 ms
MDC IDC MSMT LEADCHNL RV SENSING INTR AMPL: 12 mV
MDC IDC PG IMPLANT DT: 20161117
MDC IDC PG SERIAL: 7804513
Pulse Gen Model: 1240

## 2018-11-30 NOTE — Progress Notes (Signed)
[name] [name] Date of Birth: 09-Dec-1931 Medical Record #[id number]  History of Present Illness: [name] [name] is seen back today for follow up of CAD and CHF. He has a history of coronary disease with remote inferior lateral myocardial infarction. He is status post CABG in 2002. He has an ischemic cardiomyopathy with ejection fraction of 35-40% by Echo in November 2012. He is s/p prior ICD implant with last change out in [facility] 2016. He also has a history of atrial fibrillation. In November 2016 he underwent change out of the ICD to a PPM only. He had only had ICD therapies for Afib with RVR before.   He was admitted August 4-6 2019 with Acute GI bleed. Hbg dropped to 10.4 and stabilized. Bleeding stopped. CT showed no masses. Seen by GI and no further work up recommended.   ICD device check September 2019 showed device was functioning normally. Rate control was felt to be fairly good with few high rate episodes. When seen in September he appeared to mildly volume overloaded and lasix dose was increased.   In October 2019 he was admitted with BRBPR and abdominal pain. Hgb 11. BP was low and cardiac meds were held. Later developed rapid response to Afib and metoprolol was resumed. Altace and aldactone later resumed at lower dose. Colonoscopy was done showing  mucosal ulceration that was biopsied and then there is a stricture in the transverse colon unclear why the patient had mucosal ulceration but he did also have a end to side ileocolonic anastomosis characterized by severe stenosis of his known chronic and the end of side: No colonic anastomosis was characterized by ulceration proximal and distal mostly. There was diverticulosis in the entire examined colon and gastroenterology recommending observing the patient in the hospital for 1 more night and starting Canasa suppository twice daily as is unclear why patient had these ulcerations that could be ischemia, prolapse, or anastomotic dysfunction and  doubtful of IBD.  Device check Dec 26 showed rate well controlled by histogram.   On follow up today he is feeling well. Hgb improved to 11.1 in November. He resumed taking ramipril 5 mg daily. No dyspnea, chest pain, or increased edema. No orthopnea. No recurrent bleeding.   Current Outpatient Medications on File Prior to Visit  Medication Sig Dispense Refill  . BESIVANCE 0.6 % SUSP Place 1 drop into the right eye 3 (three) times daily.  1  . cholecalciferol (VITAMIN D) 1000 UNITS tablet Take 1,000 Units by mouth daily.     . digoxin (LANOXIN) 0.125 MG tablet TAKE 1/2 TABLET EVERY DAY (Patient taking differently: Take 0.0625 mg by mouth daily. ) 45 tablet 6  . DUREZOL 0.05 % EMUL Place 1 drop into the right eye 3 (three) times daily.  1  . furosemide (LASIX) 40 MG tablet Take 40 mg daily 5 days a week and 20 mg on Sat and Sun (Patient taking differently: Take 20-40 mg by mouth daily. Take 40 mg daily Monday-Friday and 20 mg on Saturday & Sunday) 90 tablet 3  . metoprolol succinate (TOPROL-XL) 100 MG 24 hr tablet TAKE 1 TABLET EVERY DAY (Patient taking differently: Take 100 mg by mouth daily. ) 90 tablet 3  . Multiple Vitamins-Minerals (MULTIVITAMINS THER. W/MINERALS) TABS Take 1 tablet by mouth daily.      Marland Kitchen PROAIR HFA 108 (90 BASE) MCG/ACT inhaler Inhale 2 puffs into the lungs every 4 (four) hours as needed for shortness of breath.     . spironolactone (ALDACTONE) 25 MG  tablet Take 0.5 tablets (12.5 mg total) by mouth daily. 30 tablet 0  . temazepam (RESTORIL) 7.5 MG capsule Take 1 capsule (7.5 mg total) by mouth at bedtime as needed for sleep. 30 capsule 0  . vitamin B-12 (CYANOCOBALAMIN) 1000 MCG tablet Take 1,000 mcg by mouth daily.    . nitroGLYCERIN (NITROSTAT) 0.4 MG SL tablet Place 1 tablet (0.4 mg total) under the tongue every 5 (five) minutes as needed for chest pain. (Patient not taking: Reported on 12/03/2018) 25 tablet 6   No current facility-administered medications on file prior to  visit.     Allergies  Allergen Reactions  . Iron     Stomach issues  . Lescol [Fluvastatin Sodium]     Dizziness  . Pentazocine Lactate     unknown  . Vytorin [Ezetimibe-Simvastatin]     Leg cramps   . Aspirin     Bleeding     Past Medical History:  Diagnosis Date  . CAD (coronary artery disease)    Old inferolateral MI s/p CABG in 2002  . CAP (community acquired pneumonia) 10/03/2011  . Cardiomyopathy    s/p ICD 09/30/08; EF 35 to 40% per echo in October 2021  . Chronic systolic CHF (congestive heart failure) ([place]) 07/24/2013  . Colon cancer ([place])    s/p chemo, RT, sugery  . Colorectal anastomotic stricture   . Diverticulosis   . ED (erectile dysfunction)   . GERD (gastroesophageal reflux disease)    rarely  . HTN (hypertension)   . ICD (implantable cardiac defibrillator) discharge October 2012   ICD shock for afib with RVR  . Iron deficiency anemia   . Paroxysmal atrial fibrillation (HCC)    controlled with amiodarone, not a coumadin candidate due to GI bleeding    Past Surgical History:  Procedure Laterality Date  . APPENDECTOMY    . BACK SURGERY    . BIOPSY  09/22/2018   Procedure: BIOPSY;  Surgeon: Gatha Mayer, MD;  Location: [facility];  Service: Endoscopy;;  . CARDIAC CATHETERIZATION  11/07/2000   EF 32%  . CARDIAC DEFIBRILLATOR PLACEMENT  09/30/08   MDT dual chamber ICD placed by Dr Rayann Heman  . CARDIOVASCULAR STRESS TEST  08/06/2008   EF 30%  . CHOLECYSTECTOMY    . COLON RESECTION     x2  . COLONOSCOPY     multiple  . COLONOSCOPY N/A 09/22/2018   Procedure: COLONOSCOPY;  Surgeon: Gatha Mayer, MD;  Location: [facility];  Service: Endoscopy;  Laterality: N/A;  . CORONARY ARTERY BYPASS GRAFT    . EP IMPLANTABLE DEVICE N/A 10/15/2015   Procedure:  PPM Generator Changeout;  Surgeon: [name], MD;  Location: [facility];  Service: Cardiovascular;  Laterality: N/A;  . HERNIA REPAIR    . US ECHOCARDIOGRAPHY  08/11/2008   EF 30-35%  .  VASECTOMY      Social History   Tobacco Use  Smoking Status Former Smoker  . Types: Cigars  Smokeless Tobacco Never Used    Social History   Substance and Sexual Activity  Alcohol Use No    Family History  Problem Relation Age of Onset  . Colon cancer Father   . Diabetes Sister   . Colon polyps Brother   . Pancreatic cancer Brother 58    Review of Systems: The review of systems is per the HPI. He does note some intermittent incontinence of stool.    All other systems were reviewed and are negative.  Physical Exam: BP  102/60   Pulse 77   Ht 5\' 11"  (1.803 m)   Wt 140 lb (63.5 kg)   BMI 19.53 kg/m  GENERAL:  Elderly WM in NAD. Walks with a cane. Kyphotic.  HEENT:  PERRL, EOMI, sclera are clear. Oropharynx is clear. NECK:  No jugular venous distention, carotid upstroke brisk and symmetric, no bruits, no thyromegaly or adenopathy LUNGS:  Clear to auscultation bilaterally CHEST:  Unremarkable HEART:  IRRR,  PMI not displaced or sustained,S1 and S2 within normal limits, no S3, no S4: no clicks, no rubs, gr 2/6 systolic murmur at apex. ABD:  Soft, nontender. BS +, no masses or bruits. No hepatomegaly, no splenomegaly EXT:  2 + pulses throughout, 1+ ankle edema, no cyanosis no clubbing SKIN:  Warm and dry.  No rashes NEURO:  Alert and oriented x 3. Cranial nerves II through XII intact. PSYCH:  Cognitively intact      LABORATORY DATA:  Lab Results  Component Value Date   WBC 7.9 09/24/2018   HGB 9.7 (L) 09/24/2018   HCT 31.3 (L) 09/24/2018   PLT 169 09/24/2018   GLUCOSE 83 09/23/2018   ALT 14 09/23/2018   AST 24 09/23/2018   NA 140 09/23/2018   K 4.0 09/23/2018   CL 108 09/23/2018   CREATININE 1.04 09/23/2018   BUN 19 09/23/2018   CO2 25 09/23/2018   TSH 1.87 07/26/2012   INR 1.07 07/01/2018   HGBA1C  06/11/2007    5.7 (NOTE)   The ADA recommends the following therapeutic goals for glycemic   control related to Hgb A1C measurement:   Goal of Therapy:   <  7.0% Hgb A1C   Action Suggested:  > 8.0% Hgb A1C   Ref:  Diabetes Care, 22, Suppl. 1, 1999   Dig level January 2017- 0.02 September 2018 0.4  Labs dated 03/22/16: cholesterol 152, triglycerides 43, HDL 48, LDL 95. BUN 23, creatinine 1.1.  Dated 04/11/17: cholesterol 156, triglycerides 49, HDL 58, LDL 88. Glucose 123 otherwise CMET normal. TFTs normal. Hgb 14.2. Dig level 0.5 Dated 06/18/18: cholesterol 126, triglycerides 44, HDL 50, LDL 67.  Dated 09/28/18: Hgb 11.9. creatinine 1.4.    Echo: 01/04/16:Study Conclusions  - Left ventricle: Inferior wall hypokinesis The cavity size was moderately dilated. Wall thickness was increased in a pattern of mild LVH. Systolic function was mildly to moderately reduced. The estimated ejection fraction was in the range of 40% to 45%. - Aortic valve: There was mild regurgitation. - Mitral valve: There was moderate regurgitation. - Left atrium: The atrium was moderately dilated. - Right ventricle: The cavity size was mildly dilated. - Right atrium: The atrium was mildly dilated. - Atrial septum: No defect or patent foramen ovale was identified. - Pulmonary arteries: PA peak pressure: 71 mm Hg (S).   Assessment / Plan: 1. Chronic systolic CHF. EF 40-45% by last Echo. Moderate MR. Pulmonary HTN. Edema is improved.  Will continue current lasix dose.   On ACEi, Dig, metoprolol, aldactone.  2. Atrial fibrillation. Rate is well controlled.  Not a candidate for anticoagulation due to major GI bleed history. Will continue metoprolol to 12.5 mg daily and digoxin. Device check indicates rate control is satisfactory.   3. Ischemic cardiomyopathy as per # 1.   4. Coronary disease with old inferiolateral myocardial infarction. Status post CABG in 2002. He is asymptomatic. Continue metoprolol. He is not a candidate for antiplatelet or anticoagulant therapy because of recurrent GI bleeding.  5. Hypertension, controlled.  6. Hypercholesterolemia.  On low dose  Crestor with excellent control.   7. Recurrent GI bleed. Last Hgb up to 11.1.

## 2018-12-03 ENCOUNTER — Ambulatory Visit: Payer: Medicare HMO | Admitting: Cardiology

## 2018-12-03 ENCOUNTER — Encounter: Payer: Self-pay | Admitting: Cardiology

## 2018-12-03 VITALS — BP 102/60 | HR 77 | Ht 71.0 in | Wt 140.0 lb

## 2018-12-03 DIAGNOSIS — I2510 Atherosclerotic heart disease of native coronary artery without angina pectoris: Secondary | ICD-10-CM

## 2018-12-03 DIAGNOSIS — Z95810 Presence of automatic (implantable) cardiac defibrillator: Secondary | ICD-10-CM

## 2018-12-03 DIAGNOSIS — I4821 Permanent atrial fibrillation: Secondary | ICD-10-CM

## 2018-12-03 DIAGNOSIS — I255 Ischemic cardiomyopathy: Secondary | ICD-10-CM

## 2018-12-03 DIAGNOSIS — I5022 Chronic systolic (congestive) heart failure: Secondary | ICD-10-CM

## 2018-12-03 MED ORDER — RAMIPRIL 5 MG PO CAPS: 5.0000 mg | ORAL_CAPSULE | Freq: Every day | ORAL | 3 refills | Status: DC

## 2018-12-24 ENCOUNTER — Encounter: Payer: Self-pay | Admitting: Cardiology

## 2018-12-24 DIAGNOSIS — Z950 Presence of cardiac pacemaker: Secondary | ICD-10-CM | POA: Diagnosis not present

## 2018-12-24 DIAGNOSIS — K922 Gastrointestinal hemorrhage, unspecified: Secondary | ICD-10-CM | POA: Diagnosis not present

## 2018-12-24 DIAGNOSIS — I480 Paroxysmal atrial fibrillation: Secondary | ICD-10-CM | POA: Diagnosis not present

## 2018-12-24 DIAGNOSIS — I10 Essential (primary) hypertension: Secondary | ICD-10-CM | POA: Diagnosis not present

## 2018-12-24 DIAGNOSIS — I509 Heart failure, unspecified: Secondary | ICD-10-CM | POA: Diagnosis not present

## 2018-12-24 DIAGNOSIS — J449 Chronic obstructive pulmonary disease, unspecified: Secondary | ICD-10-CM | POA: Diagnosis not present

## 2018-12-24 DIAGNOSIS — H1589 Other disorders of sclera: Secondary | ICD-10-CM | POA: Diagnosis not present

## 2018-12-24 DIAGNOSIS — I2510 Atherosclerotic heart disease of native coronary artery without angina pectoris: Secondary | ICD-10-CM | POA: Diagnosis not present

## 2018-12-24 DIAGNOSIS — D508 Other iron deficiency anemias: Secondary | ICD-10-CM | POA: Diagnosis not present

## 2019-02-01 DIAGNOSIS — D044 Carcinoma in situ of skin of scalp and neck: Secondary | ICD-10-CM | POA: Diagnosis not present

## 2019-02-01 DIAGNOSIS — Z85828 Personal history of other malignant neoplasm of skin: Secondary | ICD-10-CM | POA: Diagnosis not present

## 2019-02-01 DIAGNOSIS — D0439 Carcinoma in situ of skin of other parts of face: Secondary | ICD-10-CM | POA: Diagnosis not present

## 2019-02-01 DIAGNOSIS — L820 Inflamed seborrheic keratosis: Secondary | ICD-10-CM | POA: Diagnosis not present

## 2019-02-11 DIAGNOSIS — D0439 Carcinoma in situ of skin of other parts of face: Secondary | ICD-10-CM | POA: Diagnosis not present

## 2019-02-11 DIAGNOSIS — D044 Carcinoma in situ of skin of scalp and neck: Secondary | ICD-10-CM | POA: Diagnosis not present

## 2019-02-11 DIAGNOSIS — Z85828 Personal history of other malignant neoplasm of skin: Secondary | ICD-10-CM | POA: Diagnosis not present

## 2019-02-12 ENCOUNTER — Other Ambulatory Visit: Payer: Self-pay | Admitting: Cardiology

## 2019-02-21 ENCOUNTER — Ambulatory Visit (INDEPENDENT_AMBULATORY_CARE_PROVIDER_SITE_OTHER): Payer: Medicare HMO | Admitting: *Deleted

## 2019-02-21 ENCOUNTER — Other Ambulatory Visit: Payer: Self-pay

## 2019-02-21 DIAGNOSIS — I255 Ischemic cardiomyopathy: Secondary | ICD-10-CM | POA: Diagnosis not present

## 2019-02-21 LAB — CUP PACEART REMOTE DEVICE CHECK
Battery Remaining Longevity: 135 mo
Battery Remaining Percentage: 95.5 %
Brady Statistic RV Percent Paced: 6.4 %
Implantable Lead Implant Date: 20091103
Implantable Lead Location: 753859
Implantable Lead Model: 6947
Implantable Pulse Generator Implant Date: 20161117
Lead Channel Impedance Value: 390 Ohm
Lead Channel Pacing Threshold Amplitude: 0.75 V
Lead Channel Setting Pacing Amplitude: 2.5 V
Lead Channel Setting Sensing Sensitivity: 2 mV
MDC IDC LEAD IMPLANT DT: 20091103
MDC IDC LEAD LOCATION: 753860
MDC IDC MSMT BATTERY VOLTAGE: 3.02 V
MDC IDC MSMT LEADCHNL RV PACING THRESHOLD PULSEWIDTH: 0.4 ms
MDC IDC MSMT LEADCHNL RV SENSING INTR AMPL: 12 mV
MDC IDC SESS DTM: 20200325060012
MDC IDC SET LEADCHNL RV PACING PULSEWIDTH: 0.4 ms
Pulse Gen Serial Number: 7804513

## 2019-02-26 ENCOUNTER — Encounter: Payer: Self-pay | Admitting: Cardiology

## 2019-02-26 NOTE — Progress Notes (Signed)
Remote pacemaker transmission.   

## 2019-03-07 ENCOUNTER — Telehealth: Payer: Self-pay | Admitting: Cardiology

## 2019-03-07 MED ORDER — METOPROLOL SUCCINATE ER 100 MG PO TB24: 100.0000 mg | ORAL_TABLET | Freq: Every day | ORAL | 3 refills | Status: DC

## 2019-03-07 NOTE — Telephone Encounter (Signed)
New message   Pt c/o medication issue:  1. Name of Medication: metoprolol succinate (TOPROL-XL) 100 MG 24 hr tablet  2. How are you currently taking this medication (dosage and times per day)? 1 time daily  3. Are you having a reaction (difficulty breathing--STAT)? No   4. What is your medication issue? Patient needs a new prescription sent to [facility]

## 2019-03-07 NOTE — Telephone Encounter (Signed)
New Rx sent.

## 2019-03-27 DIAGNOSIS — R2689 Other abnormalities of gait and mobility: Secondary | ICD-10-CM | POA: Diagnosis not present

## 2019-03-27 DIAGNOSIS — J449 Chronic obstructive pulmonary disease, unspecified: Secondary | ICD-10-CM | POA: Diagnosis not present

## 2019-03-27 DIAGNOSIS — I509 Heart failure, unspecified: Secondary | ICD-10-CM | POA: Diagnosis not present

## 2019-03-27 DIAGNOSIS — Z950 Presence of cardiac pacemaker: Secondary | ICD-10-CM | POA: Diagnosis not present

## 2019-03-27 DIAGNOSIS — J309 Allergic rhinitis, unspecified: Secondary | ICD-10-CM | POA: Diagnosis not present

## 2019-03-27 DIAGNOSIS — I480 Paroxysmal atrial fibrillation: Secondary | ICD-10-CM | POA: Diagnosis not present

## 2019-03-27 DIAGNOSIS — I255 Ischemic cardiomyopathy: Secondary | ICD-10-CM | POA: Diagnosis not present

## 2019-03-27 DIAGNOSIS — D509 Iron deficiency anemia, unspecified: Secondary | ICD-10-CM | POA: Diagnosis not present

## 2019-03-27 DIAGNOSIS — I110 Hypertensive heart disease with heart failure: Secondary | ICD-10-CM | POA: Diagnosis not present

## 2019-04-23 ENCOUNTER — Telehealth: Payer: Self-pay | Admitting: Cardiology

## 2019-04-23 NOTE — Telephone Encounter (Signed)
Returned call to patient.He stated he is doing well no complaints.Stated he rather come to office for a appointment instead of a virtual appointment.Advised Dr.Jordan's schedule is full until Oct.I will put him in the Oct recalls and call him when the Oct schedule comes out.Advised to call sooner if he needs to be seen.

## 2019-04-23 NOTE — Telephone Encounter (Signed)
New message:   Patient calling concering getting a appt. I do not see anything.please call patient.

## 2019-05-23 ENCOUNTER — Ambulatory Visit (INDEPENDENT_AMBULATORY_CARE_PROVIDER_SITE_OTHER): Payer: Medicare HMO | Admitting: *Deleted

## 2019-05-23 DIAGNOSIS — R001 Bradycardia, unspecified: Secondary | ICD-10-CM

## 2019-05-23 LAB — CUP PACEART REMOTE DEVICE CHECK
Battery Remaining Longevity: 134 mo
Battery Remaining Percentage: 95.5 %
Battery Voltage: 3.02 V
Brady Statistic RV Percent Paced: 6.9 %
Date Time Interrogation Session: 20200624060014
Implantable Lead Implant Date: 20091103
Implantable Lead Implant Date: 20091103
Implantable Lead Location: 753859
Implantable Lead Location: 753860
Implantable Lead Model: 5076
Implantable Lead Model: 6947
Implantable Pulse Generator Implant Date: 20161117
Lead Channel Impedance Value: 390 Ohm
Lead Channel Sensing Intrinsic Amplitude: 12 mV
Lead Channel Setting Pacing Amplitude: 2.5 V
Lead Channel Setting Pacing Pulse Width: 0.4 ms
Lead Channel Setting Sensing Sensitivity: 2 mV
Pulse Gen Model: 1240
Pulse Gen Serial Number: 7804513

## 2019-05-30 ENCOUNTER — Other Ambulatory Visit: Payer: Self-pay | Admitting: Cardiology

## 2019-06-02 ENCOUNTER — Encounter: Payer: Self-pay | Admitting: Cardiology

## 2019-06-02 NOTE — Progress Notes (Signed)
Remote pacemaker transmission.   

## 2019-06-06 ENCOUNTER — Telehealth: Payer: Self-pay | Admitting: Cardiology

## 2019-06-06 MED ORDER — SPIRONOLACTONE 25 MG PO TABS: 12.5000 mg | ORAL_TABLET | Freq: Every day | ORAL | 0 refills | Status: DC

## 2019-06-06 NOTE — Telephone Encounter (Signed)
New message   Pt c/o medication issue:  1. Name of Medication: spironolactone (ALDACTONE) 25 MG tablet  2. How are you currently taking this medication (dosage and times per day)? 1 time daily  3. Are you having a reaction (difficulty breathing--STAT)? No   4. What is your medication issue? Patient needs a 90 day prescription sent to [facility], [place]

## 2019-06-06 NOTE — Telephone Encounter (Signed)
LVM for patient to call and schedule [facility]to[facility] appointment with Dr. [name].

## 2019-07-07 ENCOUNTER — Other Ambulatory Visit: Payer: Self-pay | Admitting: Cardiology

## 2019-07-22 DIAGNOSIS — Z125 Encounter for screening for malignant neoplasm of prostate: Secondary | ICD-10-CM | POA: Diagnosis not present

## 2019-07-22 DIAGNOSIS — E7849 Other hyperlipidemia: Secondary | ICD-10-CM | POA: Diagnosis not present

## 2019-07-22 DIAGNOSIS — M859 Disorder of bone density and structure, unspecified: Secondary | ICD-10-CM | POA: Diagnosis not present

## 2019-07-22 DIAGNOSIS — Z23 Encounter for immunization: Secondary | ICD-10-CM | POA: Diagnosis not present

## 2019-07-23 DIAGNOSIS — R82998 Other abnormal findings in urine: Secondary | ICD-10-CM | POA: Diagnosis not present

## 2019-07-29 DIAGNOSIS — R2689 Other abnormalities of gait and mobility: Secondary | ICD-10-CM | POA: Diagnosis not present

## 2019-07-29 DIAGNOSIS — R809 Proteinuria, unspecified: Secondary | ICD-10-CM | POA: Diagnosis not present

## 2019-07-29 DIAGNOSIS — I4891 Unspecified atrial fibrillation: Secondary | ICD-10-CM | POA: Diagnosis not present

## 2019-07-29 DIAGNOSIS — I480 Paroxysmal atrial fibrillation: Secondary | ICD-10-CM | POA: Diagnosis not present

## 2019-07-29 DIAGNOSIS — D509 Iron deficiency anemia, unspecified: Secondary | ICD-10-CM | POA: Diagnosis not present

## 2019-07-29 DIAGNOSIS — Z1331 Encounter for screening for depression: Secondary | ICD-10-CM | POA: Diagnosis not present

## 2019-07-29 DIAGNOSIS — R197 Diarrhea, unspecified: Secondary | ICD-10-CM | POA: Diagnosis not present

## 2019-07-29 DIAGNOSIS — J449 Chronic obstructive pulmonary disease, unspecified: Secondary | ICD-10-CM | POA: Diagnosis not present

## 2019-07-29 DIAGNOSIS — Z0000 Encounter for general adult medical examination without abnormal findings: Secondary | ICD-10-CM | POA: Diagnosis not present

## 2019-07-29 DIAGNOSIS — Z950 Presence of cardiac pacemaker: Secondary | ICD-10-CM | POA: Diagnosis not present

## 2019-07-29 DIAGNOSIS — I509 Heart failure, unspecified: Secondary | ICD-10-CM | POA: Diagnosis not present

## 2019-07-29 DIAGNOSIS — C189 Malignant neoplasm of colon, unspecified: Secondary | ICD-10-CM | POA: Diagnosis not present

## 2019-07-29 DIAGNOSIS — I110 Hypertensive heart disease with heart failure: Secondary | ICD-10-CM | POA: Diagnosis not present

## 2019-08-22 ENCOUNTER — Other Ambulatory Visit: Payer: Self-pay | Admitting: Cardiology

## 2019-08-22 ENCOUNTER — Ambulatory Visit (INDEPENDENT_AMBULATORY_CARE_PROVIDER_SITE_OTHER): Payer: Medicare HMO | Admitting: *Deleted

## 2019-08-22 DIAGNOSIS — I255 Ischemic cardiomyopathy: Secondary | ICD-10-CM | POA: Diagnosis not present

## 2019-08-23 LAB — CUP PACEART REMOTE DEVICE CHECK
Battery Remaining Longevity: 134 mo
Battery Remaining Percentage: 95.5 %
Battery Voltage: 3.02 V
Brady Statistic RV Percent Paced: 7 %
Date Time Interrogation Session: 20200923060014
Implantable Lead Implant Date: 20091103
Implantable Lead Implant Date: 20091103
Implantable Lead Location: 753859
Implantable Lead Location: 753860
Implantable Lead Model: 5076
Implantable Lead Model: 6947
Implantable Pulse Generator Implant Date: 20161117
Lead Channel Impedance Value: 400 Ohm
Lead Channel Pacing Threshold Amplitude: 0.75 V
Lead Channel Pacing Threshold Pulse Width: 0.4 ms
Lead Channel Sensing Intrinsic Amplitude: 12 mV
Lead Channel Setting Pacing Amplitude: 2.5 V
Lead Channel Setting Pacing Pulse Width: 0.4 ms
Lead Channel Setting Sensing Sensitivity: 2 mV
Pulse Gen Model: 1240
Pulse Gen Serial Number: 7804513

## 2019-08-29 ENCOUNTER — Other Ambulatory Visit: Payer: Self-pay | Admitting: Cardiology

## 2019-08-29 NOTE — Progress Notes (Deleted)
{Choose 1 Note Type (Telehealth Visit or Telephone Visit):[phone]}   Date:  08/29/2019   ID:  Elo, Marmolejos [birth year], MRN [id number]  Patient Location: Home Provider Location: Home  PCP:  Crist Infante, MD  Cardiologist:  Peter [name], MD  Electrophysiologist:  None   Evaluation Performed:  Follow-Up Visit  Chief Complaint:  CHF  History of Present Illness:    ZAIAH ECKERSON is a 85 y.o. male with history of coronary disease with remote inferior lateral myocardial infarction. He is status post CABG in 2002. He has an ischemic cardiomyopathy with ejection fraction of 35-40% by Echo in November 2012. He is s/p prior ICD implant with last change out in [facility] 2016. He also has a history of atrial fibrillation. In November 2016 he underwent change out of the ICD to a PPM only. He had only had ICD therapies for Afib with RVR before.   He was admitted August 4-6 2019 with Acute GI bleed. Hbg dropped to 10.4 and stabilized. Bleeding stopped. CT showed no masses. Seen by GI and no further work up recommended.   ICD device check September 2019 showed device was functioning normally. Rate control was felt to be fairly good with few high rate episodes. When seen in September he appeared to mildly volume overloaded and lasix dose was increased.   In October 2019 he was admitted with BRBPR and abdominal pain. Hgb 11. BP was low and cardiac meds were held. Later developed rapid response to Afib and metoprolol was resumed. Altace and aldactone later resumed at lower dose. Colonoscopy was done showing  mucosal ulceration that was biopsied and then there is a stricture in the transverse colon unclear why the patient had mucosal ulceration but he did also have a end to side ileocolonic anastomosis characterized by severe stenosis of his known chronic and the end of side: No colonic anastomosis was characterized by ulceration proximal and distal mostly. There was diverticulosis in the entire  examined colon and gastroenterology recommending observing the patient in the hospital for 1 more night and starting Canasa suppository twice daily as is unclear why patient had these ulcerations that could be ischemia, prolapse, or anastomotic dysfunction and doubtful of IBD.  Device check September 23 showed Afib with  rate well controlled by histogram. rare high rate episodes.   The patient {does/does not:200015} have symptoms concerning for COVID-19 infection (fever, chills, cough, or new shortness of breath).    Past Medical History:  Diagnosis Date  . CAD (coronary artery disease)    Old inferolateral MI s/p CABG in 2002  . CAP (community acquired pneumonia) 10/03/2011  . Cardiomyopathy    s/p ICD 09/30/08; EF 35 to 40% per echo in October 2021  . Chronic systolic CHF (congestive heart failure) ([place]) 07/24/2013  . Colon cancer ([place])    s/p chemo, RT, sugery  . Colorectal anastomotic stricture   . Diverticulosis   . ED (erectile dysfunction)   . GERD (gastroesophageal reflux disease)    rarely  . HTN (hypertension)   . ICD (implantable cardiac defibrillator) discharge October 2012   ICD shock for afib with RVR  . Iron deficiency anemia   . Paroxysmal atrial fibrillation (HCC)    controlled with amiodarone, not a coumadin candidate due to GI bleeding   Past Surgical History:  Procedure Laterality Date  . APPENDECTOMY    . BACK SURGERY    . BIOPSY  09/22/2018   Procedure: BIOPSY;  Surgeon: Gatha Mayer, MD;  Location:  [facility];  Service: Endoscopy;;  . CARDIAC CATHETERIZATION  11/07/2000   EF 32%  . CARDIAC DEFIBRILLATOR PLACEMENT  09/30/08   MDT dual chamber ICD placed by Dr Rayann Heman  . CARDIOVASCULAR STRESS TEST  08/06/2008   EF 30%  . CHOLECYSTECTOMY    . COLON RESECTION     x2  . COLONOSCOPY     multiple  . COLONOSCOPY N/A 09/22/2018   Procedure: COLONOSCOPY;  Surgeon: Gatha Mayer, MD;  Location: [facility];  Service: Endoscopy;  Laterality: N/A;  .  CORONARY ARTERY BYPASS GRAFT    . EP IMPLANTABLE DEVICE N/A 10/15/2015   Procedure:  PPM Generator Changeout;  Surgeon: [name], MD;  Location: [facility];  Service: Cardiovascular;  Laterality: N/A;  . HERNIA REPAIR    . US ECHOCARDIOGRAPHY  08/11/2008   EF 30-35%  . VASECTOMY       No outpatient medications have been marked as taking for the 08/30/19 encounter (Appointment) with [name], Peter M, MD.     Allergies:   Iron, Lescol [fluvastatin sodium], Pentazocine lactate, Vytorin [ezetimibe-simvastatin], and Aspirin   Social History   Tobacco Use  . Smoking status: Former Smoker    Types: Cigars  . Smokeless tobacco: Never Used  Substance Use Topics  . Alcohol use: No  . Drug use: No     Family Hx: The patient's family history includes Colon cancer in his father; Colon polyps in his brother; Diabetes in his sister; Pancreatic cancer (age of onset: 42) in his brother.  ROS:   Please see the history of present illness.    *** All other systems reviewed and are negative.   Prior CV studies:   The following studies were reviewed today:  Echo: 01/04/16:Study Conclusions  - Left ventricle: Inferior wall hypokinesis The cavity size was moderately dilated. Wall thickness was increased in a pattern of mild LVH. Systolic function was mildly to moderately reduced. The estimated ejection fraction was in the range of 40% to 45%. - Aortic valve: There was mild regurgitation. - Mitral valve: There was moderate regurgitation. - Left atrium: The atrium was moderately dilated. - Right ventricle: The cavity size was mildly dilated. - Right atrium: The atrium was mildly dilated. - Atrial septum: No defect or patent foramen ovale was identified. - Pulmonary arteries: PA peak pressure: 71 mm Hg (S).  Labs/Other Tests and Data Reviewed:    EKG:  {EKG/Telemetry Strips Reviewed:[phone]}  Recent Labs: 09/23/2018: ALT 14; BUN 19; Creatinine, Ser 1.04; Magnesium 1.8;  Potassium 4.0; Sodium 140 09/24/2018: Hemoglobin 9.7; Platelets 169   Recent Lipid Panel No results found for: CHOL, TRIG, HDL, CHOLHDL, LDLCALC, LDLDIRECT   Dated 07/22/19: cholesterol 128, triglycerides 44, HDL 42, LDL 77. A1c 5.3%. Hgb 12.2. CMET and TSH normal  Wt Readings from Last 3 Encounters:  12/03/18 140 lb (63.5 kg)  10/09/18 133 lb 6.4 oz (60.5 kg)  10/09/18 132 lb 4 oz (60 kg)     Objective:    Vital Signs:  There were no vitals taken for this visit.   {[facility] Virtual Exam (Optional):[phone]::"VITAL SIGNS:  reviewed"}  ASSESSMENT & PLAN:    1. Chronic systolic CHF. EF 40-45% by last Echo. Moderate MR. Pulmonary HTN. Edema is improved.  Will continue current lasix dose.   On ACEi, Dig, metoprolol, aldactone.  2. Atrial fibrillation. Rate is well controlled.  Not a candidate for anticoagulation due to major GI bleed history. Will continue metoprolol to 12.5 mg daily and digoxin. Device check  indicates rate control is satisfactory.   3. Ischemic cardiomyopathy as per # 1.   4. Coronary disease with old inferiolateral myocardial infarction. Status post CABG in 2002. He is asymptomatic. Continue metoprolol. He is not a candidate for antiplatelet or anticoagulant therapy because of recurrent GI bleeding.  5. Hypertension, controlled.  6. Hypercholesterolemia.  On low dose Crestor with excellent control.   7. Recurrent GI bleed. Last Hgb up to 11.1.   COVID-19 Education: The signs and symptoms of COVID-19 were discussed with the patient and how to seek care for testing (follow up with PCP or arrange E-visit).  ***The importance of social distancing was discussed today.  Time:   Today, I have spent *** minutes with the patient with telehealth technology discussing the above problems.     Medication Adjustments/Labs and Tests Ordered: Current medicines are reviewed at length with the patient today.  Concerns regarding medicines are outlined above.   Tests  Ordered: No orders of the defined types were placed in this encounter.   Medication Changes: No orders of the defined types were placed in this encounter.   Follow Up:  {F/U [phone]} {follow up:15908}  Signed, Peter [name], MD  08/29/2019 7:30 AM    [facility]

## 2019-08-29 NOTE — Telephone Encounter (Signed)
 *  STAT* If patient is at the pharmacy, call can be transferred to refill team.   1. Which medications need to be refilled? (please list name of each medication and dose if known) spironolactone (ALDACTONE) 25 MG tablet  2. Which pharmacy/location (including street and city if local pharmacy) is medication to be sent to? [place]  3. Do they need a 30 day or 90 day supply? [place]

## 2019-08-30 ENCOUNTER — Encounter: Payer: Self-pay | Admitting: Cardiology

## 2019-08-30 ENCOUNTER — Telehealth: Payer: Medicare HMO | Admitting: Cardiology

## 2019-08-30 NOTE — Progress Notes (Signed)
Remote pacemaker transmission.   

## 2019-09-02 ENCOUNTER — Other Ambulatory Visit: Payer: Self-pay

## 2019-09-02 MED ORDER — SPIRONOLACTONE 25 MG PO TABS: 12.5000 mg | ORAL_TABLET | Freq: Every day | ORAL | 0 refills | Status: DC

## 2019-09-02 NOTE — Telephone Encounter (Signed)
Rx(s) sent to pharmacy electronically.  

## 2019-09-04 ENCOUNTER — Other Ambulatory Visit: Payer: Self-pay | Admitting: Cardiology

## 2019-09-20 IMAGING — CT CT ABD-PELV W/ CM
2 of 5 series · 15 of 46 positions shown, 17 images · IV contrast (ISOVUE)
Comparison: 02/22/2008

CLINICAL DATA: Abdominal pain suspected diverticulitis, colon
cancer, started passing blood in loose stools last night, history
coronary artery disease post MI and CABG, cardiomyopathy, chronic
systolic CHF, hypertension, atrial fibrillation, former smoker

EXAM:
CT ABDOMEN AND PELVIS WITH CONTRAST
TECHNIQUE: Multidetector CT imaging of the abdomen and pelvis was performed
using the standard protocol following bolus administration of
intravenous contrast. Sagittal and coronal MPR images reconstructed
from axial data set.
CONTRAST:  100mL HJUILQ-H11 IOPAMIDOL (HJUILQ-H11) INJECTION 61% IV.
No oral contrast.

[Series 2: axial st · axial · 0.80mm/px · z∈[-478,-118]mm · 12 of 84 slices shown, 14 images]
[im 6/84  soft-tissue]
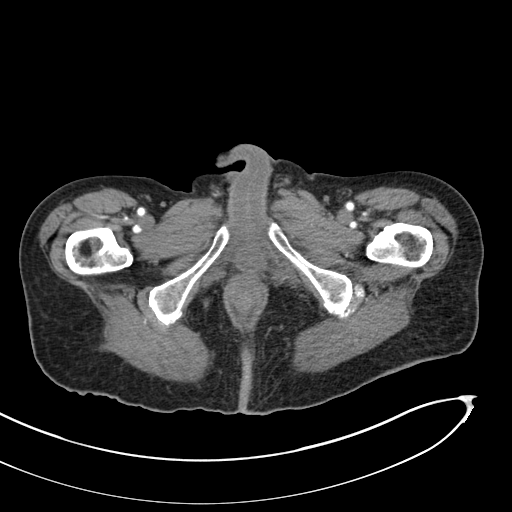
[im 6/84  bone]
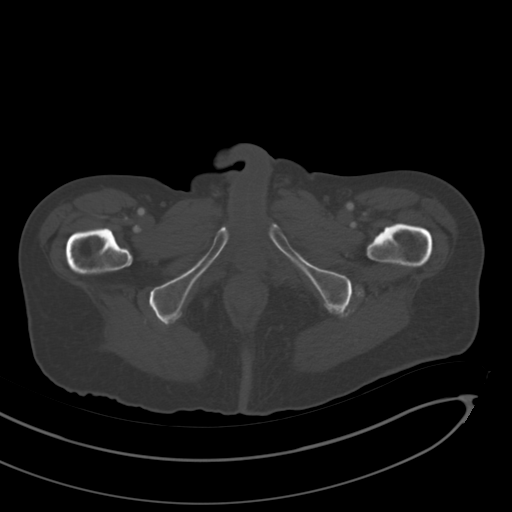
[im 12/84  soft-tissue]
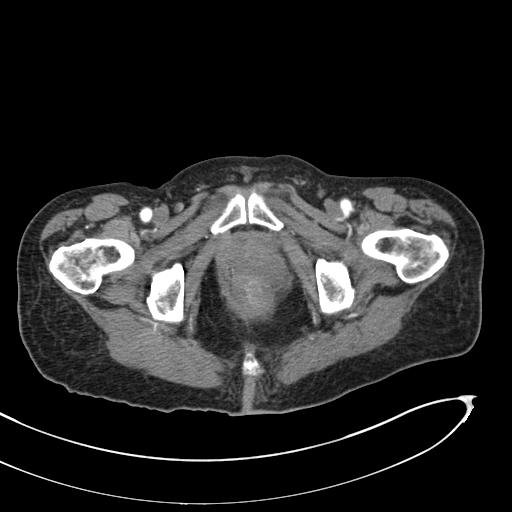
[im 17/84  soft-tissue]
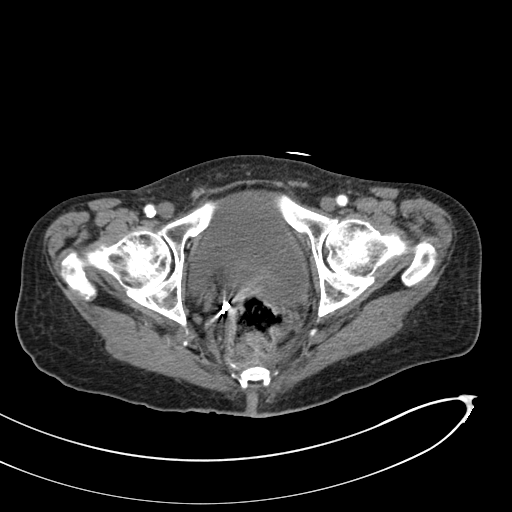
[im 28/84  soft-tissue]
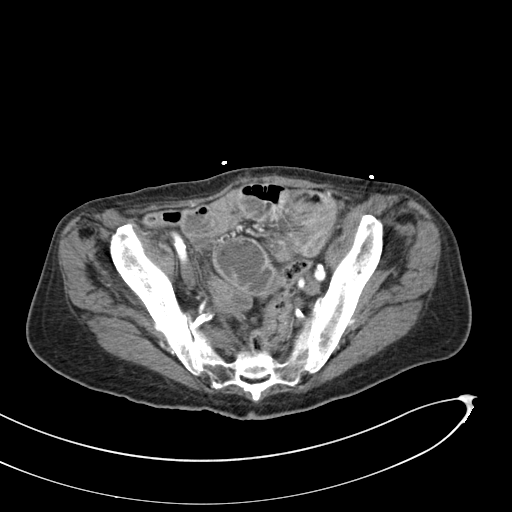
[im 34/84  soft-tissue]
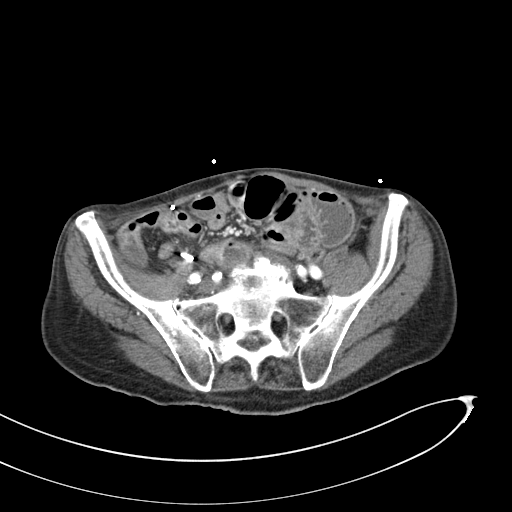
[im 39/84  soft-tissue]
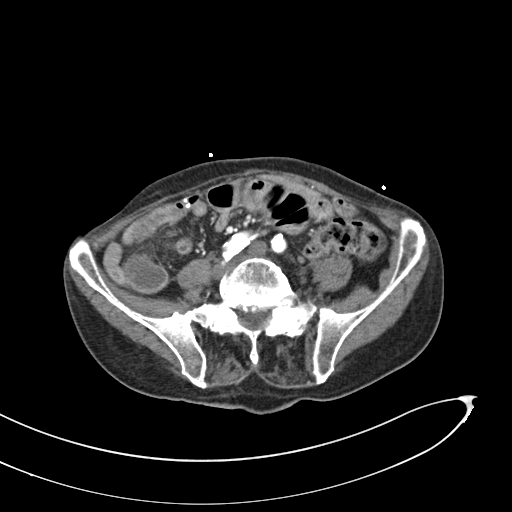
[im 45/84  soft-tissue]
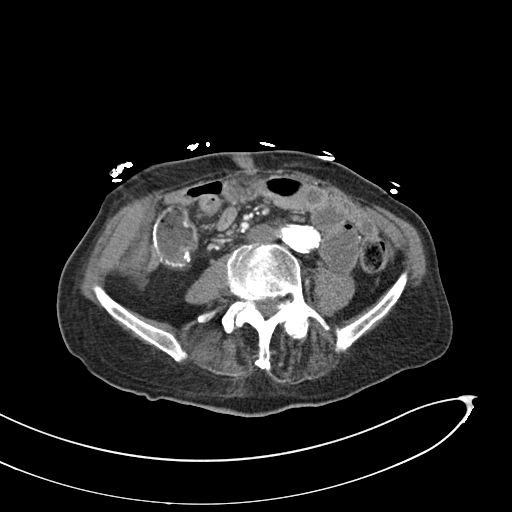
[im 50/84  soft-tissue]
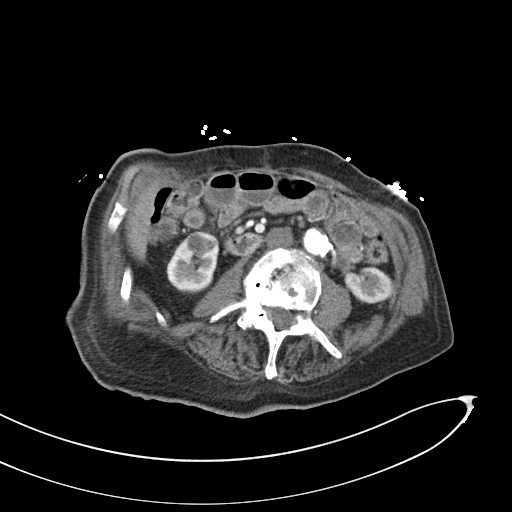
[im 56/84  soft-tissue]
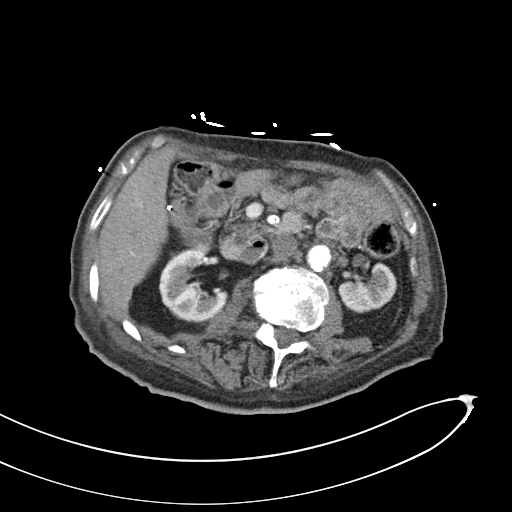
[im 56/84  bone]
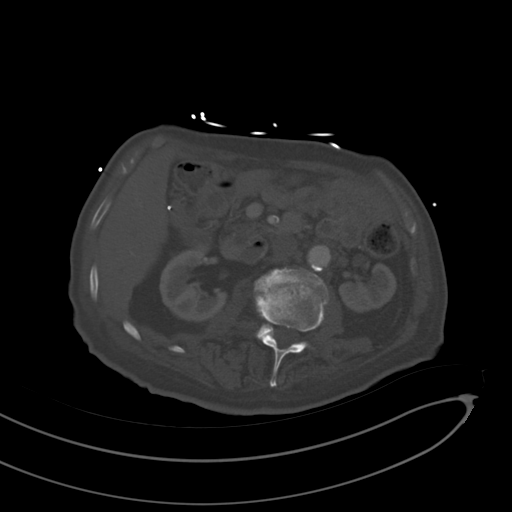
[im 67/84  soft-tissue]
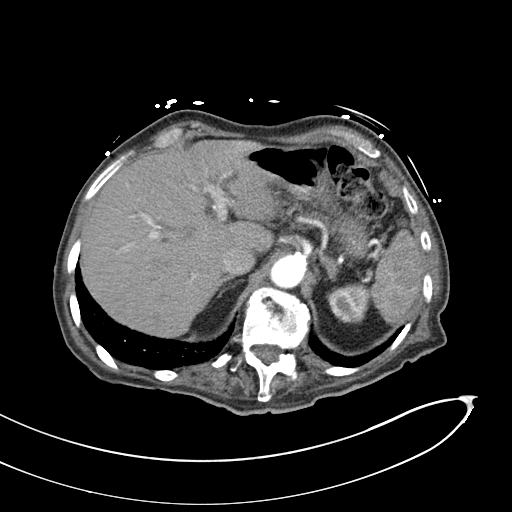
[im 72/84  soft-tissue]
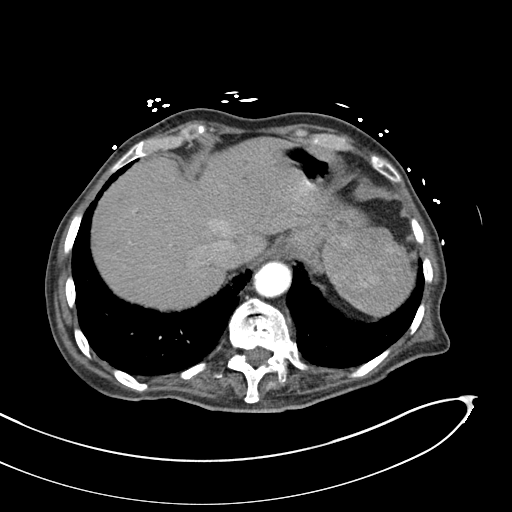
[im 78/84  soft-tissue]
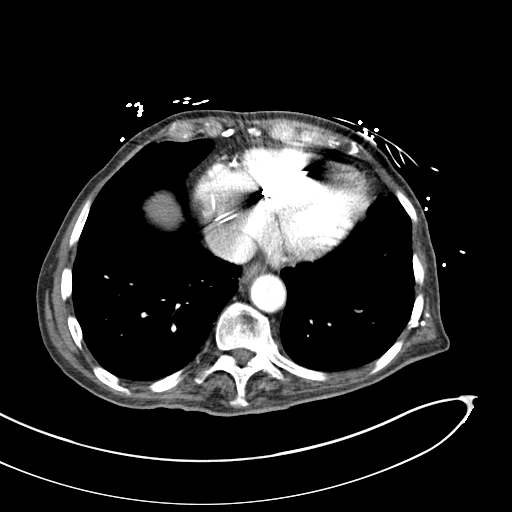

[Series 5: coronal st · coronal · 0.70mm/px · 3 of 81 slices shown]
[im 27/81  soft-tissue]
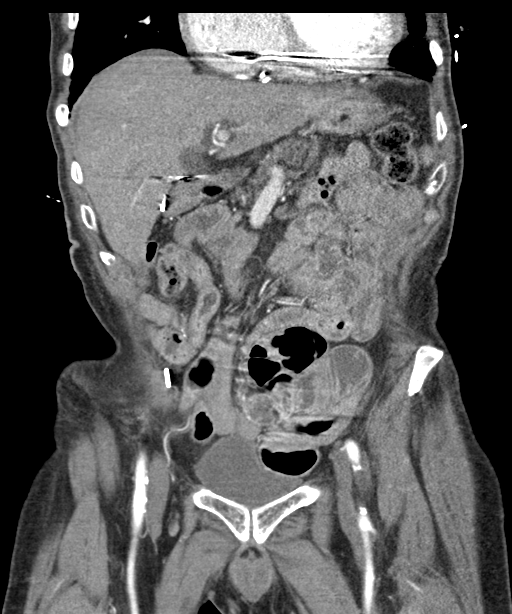
[im 36/81  soft-tissue]
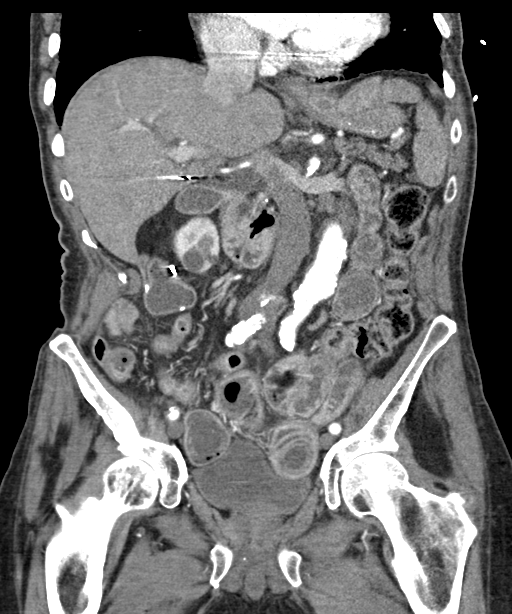
[im 45/81  soft-tissue]
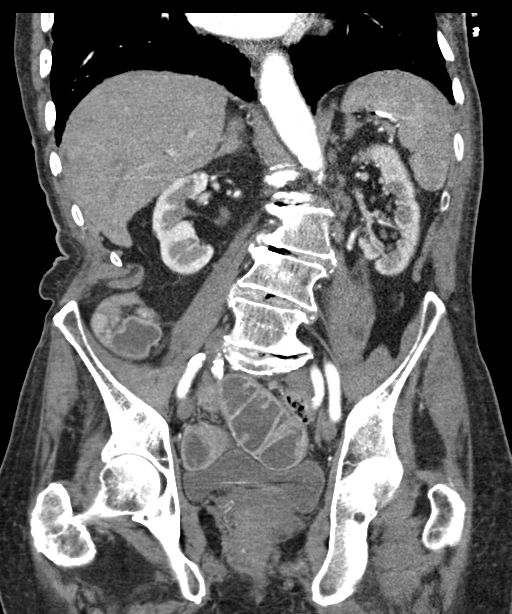

[15 of 46 positions shown; findings below may reference images not displayed]

FINDINGS: Lower chest: Bibasilar interstitial infiltrates which could
represent infection or chronic interstitial lung disease,
progressive since 1111.

Hepatobiliary: Gallbladder surgically absent. Significant biliary
dilatation with CBD 15 mm diameter tapering to 11 mm distally. Mild
intrahepatic biliary dilatation. No focal hepatic lesions.

Pancreas: Atrophic pancreas.

Spleen: Normal appearance.  Surgical clips at hilum.

Adrenals/Urinary Tract: Exophytic intermediate attenuation nodule
posterior LEFT kidney 13 x 11 mm image 27 previously 9 x 7 mm,
demonstrating washout on delayed images question small renal
neoplasm. Additional BILATERAL renal cysts. No hydronephrosis or
hydroureter. Unremarkable adrenal glands, ureters, and bladder

Stomach/Bowel: Prior ileocolic resection with anastomosis in the
RIGHT mid abdomen. Few dilated loops of small bowel are seen within
the pelvis with a questionable transition zone to normal caliber
small bowel in the RIGHT mid pelvis image 51. Rectum and stomach
incompletely distended with suboptimal assessment of wall thickness.
Minimal sigmoid diverticulosis. Remaining bowel loops unremarkable.

Vascular/Lymphatic: Extensive atherosclerotic calcifications aorta,
iliac arteries, visceral artery origins, and coronary arteries.
Enlargement of cardiac chambers with pacemaker leads RIGHT atrium
and RIGHT ventricle. No adenopathy.

Reproductive: Mild prostatic enlargement.

Other: No free air or free fluid. No definite hernia or acute
inflammatory process.

Musculoskeletal: Osseous demineralization with scoliosis and
degenerative changes of the lumbar spine.
IMPRESSION: Increased biliary dilatation since 1111, CBD 15 mm diameter,
recommend correlation with LFTs.

Minimal sigmoid diverticulosis.

Unable to exclude gastric and rectal wall thickening though these
could be areas of artifact from underdistention.

Extensive atherosclerotic calcifications aorta, branch vessels, and
coronary arteries.

13 x 11 mm enhancing nodule at the posterior margin of the LEFT
kidney concerning for a small renal neoplasm.

## 2019-10-20 NOTE — Progress Notes (Deleted)
[name] [name] Date of Birth: 1932-09-24 Medical Record #[id number]  History of Present Illness: [name] [name] is seen back today for follow up of CAD and CHF. He has a history of coronary disease with remote inferior lateral myocardial infarction. He is status post CABG in 2002. He has an ischemic cardiomyopathy with ejection fraction of 35-40% by Echo in November 2012. He is s/p prior ICD implant with last change out in [facility] 2016. He also has a history of atrial fibrillation. In November 2016 he underwent change out of the ICD to a PPM only. He had only had ICD therapies for Afib with RVR before.   He was admitted August 4-6 2019 with Acute GI bleed. Hbg dropped to 10.4 and stabilized. Bleeding stopped. CT showed no masses. Seen by GI and no further work up recommended.   ICD device check September 2019 showed device was functioning normally. Rate control was felt to be fairly good with few high rate episodes. When seen in September he appeared to mildly volume overloaded and lasix dose was increased.   In October 2019 he was admitted with BRBPR and abdominal pain. Hgb 11. BP was low and cardiac meds were held. Later developed rapid response to Afib and metoprolol was resumed. Altace and aldactone later resumed at lower dose. Colonoscopy was done showing  mucosal ulceration that was biopsied and then there is a stricture in the transverse colon unclear why the patient had mucosal ulceration but he did also have a end to side ileocolonic anastomosis characterized by severe stenosis of his known chronic and the end of side: No colonic anastomosis was characterized by ulceration proximal and distal mostly. There was diverticulosis in the entire examined colon and gastroenterology recommending observing the patient in the hospital for 1 more night and starting Canasa suppository twice daily as is unclear why patient had these ulcerations that could be ischemia, prolapse, or anastomotic dysfunction and  doubtful of IBD.  Device check 08/21/19 showed rate overall well controlled by histogram.   On follow up today he is feeling well. Hgb improved to 12.2. He resumed taking ramipril 5 mg daily. No dyspnea, chest pain, or increased edema. No orthopnea. No recurrent bleeding.   Current Outpatient Medications on File Prior to Visit  Medication Sig Dispense Refill  . BESIVANCE 0.6 % SUSP Place 1 drop into the right eye 3 (three) times daily.  1  . cholecalciferol (VITAMIN D) 1000 UNITS tablet Take 1,000 Units by mouth daily.     . digoxin (LANOXIN) 0.125 MG tablet TAKE 1/2 TABLET EVERY DAY 45 tablet 6  . DUREZOL 0.05 % EMUL Place 1 drop into the right eye 3 (three) times daily.  1  . furosemide (LASIX) 40 MG tablet TAKE 1 TABLET FIVE DAYS A WEEK AND 1/2 TABLET ON SATURDAY AND SUNDAY 78 tablet 2  . metoprolol succinate (TOPROL-XL) 100 MG 24 hr tablet Take 1 tablet (100 mg total) by mouth daily. Take with or immediately following a meal. 90 tablet 3  . Multiple Vitamins-Minerals (MULTIVITAMINS THER. W/MINERALS) TABS Take 1 tablet by mouth daily.      . nitroGLYCERIN (NITROSTAT) 0.4 MG SL tablet Place 1 tablet (0.4 mg total) under the tongue every 5 (five) minutes as needed for chest pain. (Patient not taking: Reported on 12/03/2018) 25 tablet 6  . PROAIR HFA 108 (90 BASE) MCG/ACT inhaler Inhale 2 puffs into the lungs every 4 (four) hours as needed for shortness of breath.     . ramipril (ALTACE)  10 MG capsule Take 1 capsule (10 mg total) by mouth daily. OV NEEDED 90 capsule 0  . spironolactone (ALDACTONE) 25 MG tablet Take 0.5 tablets (12.5 mg total) by mouth daily. 45 tablet 0  . temazepam (RESTORIL) 7.5 MG capsule Take 1 capsule (7.5 mg total) by mouth at bedtime as needed for sleep. 30 capsule 0  . vitamin B-12 (CYANOCOBALAMIN) 1000 MCG tablet Take 1,000 mcg by mouth daily.     No current facility-administered medications on file prior to visit.     Allergies  Allergen Reactions  . Iron      Stomach issues  . Lescol [Fluvastatin Sodium]     Dizziness  . Pentazocine Lactate     unknown  . Vytorin [Ezetimibe-Simvastatin]     Leg cramps   . Aspirin     Bleeding     Past Medical History:  Diagnosis Date  . CAD (coronary artery disease)    Old inferolateral MI s/p CABG in 2002  . CAP (community acquired pneumonia) 10/03/2011  . Cardiomyopathy    s/p ICD 09/30/08; EF 35 to 40% per echo in October 2021  . Chronic systolic CHF (congestive heart failure) ([place]) 07/24/2013  . Colon cancer ([place])    s/p chemo, RT, sugery  . Colorectal anastomotic stricture   . Diverticulosis   . ED (erectile dysfunction)   . GERD (gastroesophageal reflux disease)    rarely  . HTN (hypertension)   . ICD (implantable cardiac defibrillator) discharge October 2012   ICD shock for afib with RVR  . Iron deficiency anemia   . Paroxysmal atrial fibrillation (HCC)    controlled with amiodarone, not a coumadin candidate due to GI bleeding    Past Surgical History:  Procedure Laterality Date  . APPENDECTOMY    . BACK SURGERY    . BIOPSY  09/22/2018   Procedure: BIOPSY;  Surgeon: Gatha Mayer, MD;  Location: [facility];  Service: Endoscopy;;  . CARDIAC CATHETERIZATION  11/07/2000   EF 32%  . CARDIAC DEFIBRILLATOR PLACEMENT  09/30/08   MDT dual chamber ICD placed by Dr Rayann Heman  . CARDIOVASCULAR STRESS TEST  08/06/2008   EF 30%  . CHOLECYSTECTOMY    . COLON RESECTION     x2  . COLONOSCOPY     multiple  . COLONOSCOPY N/A 09/22/2018   Procedure: COLONOSCOPY;  Surgeon: Gatha Mayer, MD;  Location: [facility];  Service: Endoscopy;  Laterality: N/A;  . CORONARY ARTERY BYPASS GRAFT    . EP IMPLANTABLE DEVICE N/A 10/15/2015   Procedure:  PPM Generator Changeout;  Surgeon: [name], MD;  Location: [place] [facility];  Service: Cardiovascular;  Laterality: N/A;  . HERNIA REPAIR    . US ECHOCARDIOGRAPHY  08/11/2008   EF 30-35%  . VASECTOMY      Social History   Tobacco Use  Smoking  Status Former Smoker  . Types: Cigars  Smokeless Tobacco Never Used    Social History   Substance and Sexual Activity  Alcohol Use No    Family History  Problem Relation Age of Onset  . Colon cancer Father   . Diabetes Sister   . Colon polyps Brother   . Pancreatic cancer Brother 52    Review of Systems: The review of systems is per the HPI. He does note some intermittent incontinence of stool.    All other systems were reviewed and are negative.  Physical Exam: There were no vitals taken for this visit. GENERAL:  Elderly WM in  NAD. Walks with a cane. Kyphotic.  HEENT:  PERRL, EOMI, sclera are clear. Oropharynx is clear. NECK:  No jugular venous distention, carotid upstroke brisk and symmetric, no bruits, no thyromegaly or adenopathy LUNGS:  Clear to auscultation bilaterally CHEST:  Unremarkable HEART:  IRRR,  PMI not displaced or sustained,S1 and S2 within normal limits, no S3, no S4: no clicks, no rubs, gr 2/6 systolic murmur at apex. ABD:  Soft, nontender. BS +, no masses or bruits. No hepatomegaly, no splenomegaly EXT:  2 + pulses throughout, 1+ ankle edema, no cyanosis no clubbing SKIN:  Warm and dry.  No rashes NEURO:  Alert and oriented x 3. Cranial nerves II through XII intact. PSYCH:  Cognitively intact      LABORATORY DATA:  Lab Results  Component Value Date   WBC 7.9 09/24/2018   HGB 9.7 (L) 09/24/2018   HCT 31.3 (L) 09/24/2018   PLT 169 09/24/2018   GLUCOSE 83 09/23/2018   ALT 14 09/23/2018   AST 24 09/23/2018   NA 140 09/23/2018   K 4.0 09/23/2018   CL 108 09/23/2018   CREATININE 1.04 09/23/2018   BUN 19 09/23/2018   CO2 25 09/23/2018   TSH 1.87 07/26/2012   INR 1.07 07/01/2018   HGBA1C  06/11/2007    5.7 (NOTE)   The ADA recommends the following therapeutic goals for glycemic   control related to Hgb A1C measurement:   Goal of Therapy:   < 7.0% Hgb A1C   Action Suggested:  > 8.0% Hgb A1C   Ref:  Diabetes Care, 22, Suppl. 1, 1999   Dig level  January 2017- 0.02 September 2018 0.4  Labs dated 03/22/16: cholesterol 152, triglycerides 43, HDL 48, LDL 95. BUN 23, creatinine 1.1.  Dated 04/11/17: cholesterol 156, triglycerides 49, HDL 58, LDL 88. Glucose 123 otherwise CMET normal. TFTs normal. Hgb 14.2. Dig level 0.5 Dated 06/18/18: cholesterol 126, triglycerides 44, HDL 50, LDL 67.  Dated 09/28/18: Hgb 11.9. creatinine 1.4.  Dated 07/23/19: A1c 5.3%. cholesterol 228, triglycerides 44, HDL 42, LDL 77. Hgb 12.2. otherwise CBC, CMET, and TSH normal.  Dated 12/24/18: dig level 0.5   Echo: 01/04/16:Study Conclusions  - Left ventricle: Inferior wall hypokinesis The cavity size was moderately dilated. Wall thickness was increased in a pattern of mild LVH. Systolic function was mildly to moderately reduced. The estimated ejection fraction was in the range of 40% to 45%. - Aortic valve: There was mild regurgitation. - Mitral valve: There was moderate regurgitation. - Left atrium: The atrium was moderately dilated. - Right ventricle: The cavity size was mildly dilated. - Right atrium: The atrium was mildly dilated. - Atrial septum: No defect or patent foramen ovale was identified. - Pulmonary arteries: PA peak pressure: 71 mm Hg (S).   Assessment / Plan: 1. Chronic systolic CHF. EF 40-45% by last Echo. Moderate MR. Pulmonary HTN. Edema is improved.  Will continue current lasix dose.   On ACEi, Dig, metoprolol, aldactone.  2. Atrial fibrillation. Rate is well controlled.  Not a candidate for anticoagulation due to major GI bleed history. Will continue metoprolol to 12.5 mg daily and digoxin. Device check indicates rate control is satisfactory.   3. Ischemic cardiomyopathy as per # 1.   4. Coronary disease with old inferiolateral myocardial infarction. Status post CABG in 2002. He is asymptomatic. Continue metoprolol. He is not a candidate for antiplatelet or anticoagulant therapy because of recurrent GI bleeding.  5. Hypertension,  controlled.  6. Hypercholesterolemia.  On low dose  Crestor with excellent control.   7. Recurrent GI bleed. Last Hgb up to 12.2

## 2019-10-23 ENCOUNTER — Ambulatory Visit: Payer: Medicare HMO | Admitting: Cardiology

## 2019-11-21 ENCOUNTER — Ambulatory Visit (INDEPENDENT_AMBULATORY_CARE_PROVIDER_SITE_OTHER): Payer: Medicare HMO | Admitting: *Deleted

## 2019-11-21 DIAGNOSIS — I255 Ischemic cardiomyopathy: Secondary | ICD-10-CM

## 2019-11-21 DIAGNOSIS — Z45018 Encounter for adjustment and management of other part of cardiac pacemaker: Secondary | ICD-10-CM | POA: Diagnosis not present

## 2019-11-21 LAB — CUP PACEART REMOTE DEVICE CHECK
Battery Remaining Longevity: 136 mo
Battery Remaining Percentage: 95.5 %
Battery Voltage: 3.02 V
Brady Statistic RV Percent Paced: 7.4 %
Date Time Interrogation Session: 20201223020023
Implantable Lead Implant Date: 20091103
Implantable Lead Implant Date: 20091103
Implantable Lead Location: 753859
Implantable Lead Location: 753860
Implantable Lead Model: 5076
Implantable Lead Model: 6947
Implantable Pulse Generator Implant Date: 20161117
Lead Channel Impedance Value: 390 Ohm
Lead Channel Pacing Threshold Amplitude: 0.75 V
Lead Channel Pacing Threshold Pulse Width: 0.4 ms
Lead Channel Sensing Intrinsic Amplitude: 12 mV
Lead Channel Setting Pacing Amplitude: 2.5 V
Lead Channel Setting Pacing Pulse Width: 0.4 ms
Lead Channel Setting Sensing Sensitivity: 2 mV
Pulse Gen Model: 1240
Pulse Gen Serial Number: 7804513

## 2019-11-24 NOTE — Progress Notes (Signed)
PPM remote 

## 2019-12-03 ENCOUNTER — Other Ambulatory Visit: Payer: Self-pay | Admitting: Cardiology

## 2019-12-25 ENCOUNTER — Ambulatory Visit: Payer: Medicare HMO

## 2020-01-04 ENCOUNTER — Other Ambulatory Visit: Payer: Self-pay | Admitting: Cardiology

## 2020-01-06 ENCOUNTER — Other Ambulatory Visit: Payer: Self-pay | Admitting: Cardiology

## 2020-01-08 ENCOUNTER — Encounter: Payer: Self-pay | Admitting: Physician Assistant

## 2020-01-08 ENCOUNTER — Telehealth (INDEPENDENT_AMBULATORY_CARE_PROVIDER_SITE_OTHER): Payer: Medicare HMO | Admitting: Physician Assistant

## 2020-01-08 ENCOUNTER — Other Ambulatory Visit: Payer: Self-pay | Admitting: Physician Assistant

## 2020-01-08 VITALS — BP 118/66 | HR 84 | Ht 71.0 in | Wt 140.0 lb

## 2020-01-08 DIAGNOSIS — Z950 Presence of cardiac pacemaker: Secondary | ICD-10-CM

## 2020-01-08 DIAGNOSIS — E785 Hyperlipidemia, unspecified: Secondary | ICD-10-CM

## 2020-01-08 DIAGNOSIS — I4819 Other persistent atrial fibrillation: Secondary | ICD-10-CM

## 2020-01-08 DIAGNOSIS — I10 Essential (primary) hypertension: Secondary | ICD-10-CM

## 2020-01-08 DIAGNOSIS — I2510 Atherosclerotic heart disease of native coronary artery without angina pectoris: Secondary | ICD-10-CM | POA: Diagnosis not present

## 2020-01-08 MED ORDER — RAMIPRIL 10 MG PO CAPS: 10.0000 mg | ORAL_CAPSULE | Freq: Every day | ORAL | 3 refills | Status: DC

## 2020-01-08 MED ORDER — FUROSEMIDE 40 MG PO TABS: ORAL_TABLET | ORAL | 2 refills | Status: DC

## 2020-01-08 MED ORDER — SPIRONOLACTONE 25 MG PO TABS: 12.5000 mg | ORAL_TABLET | Freq: Every day | ORAL | 3 refills | Status: DC

## 2020-01-08 MED ORDER — METOPROLOL SUCCINATE ER 100 MG PO TB24: ORAL_TABLET | ORAL | 3 refills | Status: DC

## 2020-01-08 MED ORDER — DIGOXIN 125 MCG PO TABS: 62.5000 ug | ORAL_TABLET | Freq: Every day | ORAL | 3 refills | Status: DC

## 2020-01-08 NOTE — Progress Notes (Signed)
Virtual Visit via Telephone Note   This visit type was conducted due to national recommendations for restrictions regarding the COVID-19 Pandemic (e.g. social distancing) in an effort to limit this patient's exposure and mitigate transmission in our community.  Due to his co-morbid illnesses, this patient is at least at moderate risk for complications without adequate follow up.  This format is felt to be most appropriate for this patient at this time.  The patient did not have access to video technology/had technical difficulties with video requiring transitioning to audio format only (telephone).  All issues noted in this document were discussed and addressed.  No physical exam could be performed with this format.  Please refer to the patient's chart for his  consent to telehealth for [facility].   Date:  01/10/2020   ID:  [name], [name] [birth year], MRN [id number]  Patient Location: Home Provider Location: Office  PCP:  Crist Infante, MD  Cardiologist:  Peter [name], MD Electrophysiologist:  None   Evaluation Performed:  Follow-Up Visit  Chief Complaint:  followup  History of Present Illness:    [name] [name] is a 86 y.o. male with CAD, ICM s/p PPM, HTN, HLD, recurrent GI bleed and PAF.  He has a history of remote inferolateral MI.  He underwent CABG in 2002. EF was 35 to 40% by echocardiogram in November 2012.  He underwent prior ICD implant for ischemic cardiomyopathy with last device change out in November 2016 to PPM only.  The only ICD discharge that happened before was for atrial fibrillation with RVR.  In August 2019, he developed acute GI bleed.  CT showed no masses.  He was seen by GI service and in no further work-up was recommended.  He had recurrent GI bleed in October 2019.  He had rapid A. fib during the hospitalization.  Colonoscopy revealed a mucosal ulceration that was biopsied.  He was considered not a candidate for anticoagulation due to major GI bleed.  Patient  was contacted today via virtual visit.  He continued to have frequent diarrhea and has been placed on Lomotil recently by Dr. Haynes Kerns.  He says he got several inches of intestine taken off for colon cancer in 1980s and more taken off in 1999.  He denies any recent chest pain or shortness of breath.  Unfortunately his wife with whom he had married for 67 years passed away 2 weeks ago.  He lives very close by to both his son and a daughter.  His son who is currently retired from the city of [place] visit him on a daily basis and walk his dog and to take out the trash.  His daughter who works on the administrative side of [employer] also bring him food on a daily basis as well.  He does occasionally has some ankle edema, he is managing the diuretic dosing by himself.  He does not take the diuretic on a daily basis instead only take it on a as needed basis.  Otherwise his blood pressure seems to be very well controlled on the current therapy.  He denies any dizziness, fluttering sensation, orthopnea or PND.  Overall, he is doing well from the heart perspective.  He already has had the first COVID-19 vaccine shot, he is due to obtain his second COVID-19 vaccine shots this afternoon.  I will refill his cardiac medications.  He can follow-up with Dr. [name] in 6 months.  Last lipid panel obtained in August 2020 was  borderline.  Creatinine was 1.0 at the time.  The patient does not have symptoms concerning for COVID-19 infection (fever, chills, cough, or new shortness of breath).    Past Medical History:  Diagnosis Date  . CAD (coronary artery disease)    Old inferolateral MI s/p CABG in 2002  . CAP (community acquired pneumonia) 10/03/2011  . Cardiomyopathy    s/p ICD 09/30/08; EF 35 to 40% per echo in October 2021  . Chronic systolic CHF (congestive heart failure) ([place]) 07/24/2013  . Colon cancer ([name] [name])    s/p chemo, RT, sugery  . Colorectal anastomotic stricture   .  Diverticulosis   . ED (erectile dysfunction)   . GERD (gastroesophageal reflux disease)    rarely  . HTN (hypertension)   . ICD (implantable cardiac defibrillator) discharge October 2012   ICD shock for afib with RVR  . Iron deficiency anemia   . Paroxysmal atrial fibrillation (HCC)    controlled with amiodarone, not a coumadin candidate due to GI bleeding   Past Surgical History:  Procedure Laterality Date  . APPENDECTOMY    . BACK SURGERY    . BIOPSY  09/22/2018   Procedure: BIOPSY;  Surgeon: Gatha Mayer, MD;  [facility];  Service: Endoscopy;;  . CARDIAC CATHETERIZATION  11/07/2000   EF 32%  . CARDIAC DEFIBRILLATOR PLACEMENT  09/30/08   MDT dual chamber ICD placed by Dr Rayann Heman  . CARDIOVASCULAR STRESS TEST  08/06/2008   EF 30%  . CHOLECYSTECTOMY    . COLON RESECTION     x2  . COLONOSCOPY     multiple  . COLONOSCOPY N/A 09/22/2018   Procedure: COLONOSCOPY;  Surgeon: Gatha Mayer, MD;  Location: [facility];  Service: Endoscopy;  Laterality: N/A;  . CORONARY ARTERY BYPASS GRAFT    . EP IMPLANTABLE DEVICE N/A 10/15/2015   Procedure:  PPM Generator Changeout;  Surgeon: [name], MD;  Location: [place] CV LAB;  Service: Cardiovascular;  Laterality: N/A;  . HERNIA REPAIR    . US ECHOCARDIOGRAPHY  08/11/2008   EF 30-35%  . VASECTOMY       Current Meds  Medication Sig  . BESIVANCE 0.6 % SUSP Place 1 drop into the right eye 3 (three) times daily.  . cholecalciferol (VITAMIN D) 1000 UNITS tablet Take 1,000 Units by mouth daily.   . digoxin (LANOXIN) 0.125 MG tablet Take 0.5 tablets (62.5 mcg total) by mouth daily.  . diphenoxylate-atropine (LOMOTIL) 2.5-0.025 MG tablet   . DUREZOL 0.05 % EMUL Place 1 drop into the right eye 3 (three) times daily.  . furosemide (LASIX) 40 MG tablet TAKE 1 TABLET FIVE DAYS A WEEK AND 1/2 TABLET ON SATURDAY AND SUNDAY  . metoprolol succinate (TOPROL-XL) 100 MG 24 hr tablet TAKE 1 TABLET (100 MG TOTAL) BY MOUTH DAILY WITH OR  IMMEDIATELY FOLLOWING A MEAL  . Multiple Vitamins-Minerals (MULTIVITAMINS THER. W/MINERALS) TABS Take 1 tablet by mouth daily.    . nitroGLYCERIN (NITROSTAT) 0.4 MG SL tablet Place 1 tablet (0.4 mg total) under the tongue every 5 (five) minutes as needed for chest pain.  Marland Kitchen PROAIR HFA 108 (90 BASE) MCG/ACT inhaler Inhale 2 puffs into the lungs every 4 (four) hours as needed for shortness of breath.   . ramipril (ALTACE) 10 MG capsule Take 1 capsule (10 mg total) by mouth daily.  Marland Kitchen spironolactone (ALDACTONE) 25 MG tablet Take 0.5 tablets (12.5 mg total) by mouth daily. Please schedule annual appt with Dr. [name] for refills. [phone].  1st attempt.  . temazepam (RESTORIL) 7.5 MG capsule Take 1 capsule (7.5 mg total) by mouth at bedtime as needed for sleep.  . vitamin B-12 (CYANOCOBALAMIN) 1000 MCG tablet Take 1,000 mcg by mouth daily.  . [DISCONTINUED] digoxin (LANOXIN) 0.125 MG tablet TAKE 1/2 TABLET EVERY DAY  . [DISCONTINUED] furosemide (LASIX) 40 MG tablet TAKE 1 TABLET FIVE DAYS A WEEK AND 1/2 TABLET ON SATURDAY AND SUNDAY  . [DISCONTINUED] metoprolol succinate (TOPROL-XL) 100 MG 24 hr tablet TAKE 1 TABLET (100 MG TOTAL) BY MOUTH DAILY WITH OR IMMEDIATELY FOLLOWING A MEAL  . [DISCONTINUED] ramipril (ALTACE) 10 MG capsule Take 1 capsule (10 mg total) by mouth daily. OV NEEDED  . [DISCONTINUED] spironolactone (ALDACTONE) 25 MG tablet Take 0.5 tablets (12.5 mg total) by mouth daily. Please schedule annual appt with Dr. [name] for refills. [phone]. 1st attempt.     Allergies:   Iron, Lescol [fluvastatin sodium], Pentazocine lactate, Vytorin [ezetimibe-simvastatin], and Aspirin   Social History   Tobacco Use  . Smoking status: Former Smoker    Types: Cigars  . Smokeless tobacco: Never Used  Substance Use Topics  . Alcohol use: No  . Drug use: No     Family Hx: The patient's family history includes Colon cancer in his father; Colon polyps in his brother; Diabetes in his sister;  Pancreatic cancer (age of onset: 17) in his brother.  ROS:   Please see the history of present illness.     All other systems reviewed and are negative.   Prior CV studies:   The following studies were reviewed today:  Echo 01/04/2016 LV EF: 40% -  45%   -------------------------------------------------------------------  Indications:   CHF (I50.22).   -------------------------------------------------------------------  History:  PMH:  Atrial fibrillation. Coronary artery disease.  Ischemic cardiomyopathy. Congestive heart failure. Risk factors:  Dizziness.   -------------------------------------------------------------------  Study Conclusions   - Left ventricle: Inferior wall hypokinesis The cavity size was  moderately dilated. Wall thickness was increased in a pattern of  mild LVH. Systolic function was mildly to moderately reduced. The  estimated ejection fraction was in the range of 40% to 45%.  - Aortic valve: There was mild regurgitation.  - Mitral valve: There was moderate regurgitation.  - Left atrium: The atrium was moderately dilated.  - Right ventricle: The cavity size was mildly dilated.  - Right atrium: The atrium was mildly dilated.  - Atrial septum: No defect or patent foramen ovale was identified.  - Pulmonary arteries: PA peak pressure: 71 mm Hg (S).  Labs/Other Tests and Data Reviewed:    EKG:  An ECG dated 10/09/2018 was personally reviewed today and demonstrated:  Atrial fibrillation  Recent Labs: No results found for requested labs within last 8760 hours.   Recent Lipid Panel No results found for: CHOL, TRIG, HDL, CHOLHDL, LDLCALC, LDLDIRECT  Wt Readings from Last 3 Encounters:  01/08/20 140 lb (63.5 kg)  12/03/18 140 lb (63.5 kg)  10/09/18 133 lb 6.4 oz (60.5 kg)     Objective:    Vital Signs:  BP 118/66   Pulse 84   Ht 5\' 11"  (1.803 m)   Wt 140 lb (63.5 kg)   BMI 19.53 kg/m    VITAL SIGNS:  reviewed  ASSESSMENT &  PLAN:    1. CAD: Denies any chest pain.   2. Ischemic cardiomyopathy s/p pacemaker: Recent remote transmission showed stable device function.  No recent heart failure symptoms.  He manages his own diuretic dose and does not use on  a daily basis.  Last lab work showed stable renal function  3. Hypertension: Continue on current therapy.  Blood pressure well controlled  4. Hyperlipidemia: Continue Crestor  5. Persistent atrial fibrillation: Rate controlled.  Not a candidate for anticoagulation due to history of major GI bleed.  COVID-19 Education: The signs and symptoms of COVID-19 were discussed with the patient and how to seek care for testing (follow up with PCP or arrange E-visit).  The importance of social distancing was discussed today.  Time:   Today, I have spent 10 minutes with the patient with telehealth technology discussing the above problems.     Medication Adjustments/Labs and Tests Ordered: Current medicines are reviewed at length with the patient today.  Concerns regarding medicines are outlined above.   Tests Ordered: No orders of the defined types were placed in this encounter.   Medication Changes: Meds ordered this encounter  Medications  . digoxin (LANOXIN) 0.125 MG tablet    Sig: Take 0.5 tablets (62.5 mcg total) by mouth daily.    Dispense:  45 tablet    Refill:  3  . furosemide (LASIX) 40 MG tablet    Sig: TAKE 1 TABLET FIVE DAYS A WEEK AND 1/2 TABLET ON SATURDAY AND SUNDAY    Dispense:  78 tablet    Refill:  2  . metoprolol succinate (TOPROL-XL) 100 MG 24 hr tablet    Sig: TAKE 1 TABLET (100 MG TOTAL) BY MOUTH DAILY WITH OR IMMEDIATELY FOLLOWING A MEAL    Dispense:  90 tablet    Refill:  3  . ramipril (ALTACE) 10 MG capsule    Sig: Take 1 capsule (10 mg total) by mouth daily.    Dispense:  90 capsule    Refill:  3  . spironolactone (ALDACTONE) 25 MG tablet    Sig: Take 0.5 tablets (12.5 mg total) by mouth daily. Please schedule annual appt with Dr.  [name] for refills. [phone]. 1st attempt.    Dispense:  45 tablet    Refill:  3    Follow Up:  Either In Person or Virtual in 1 year(s)  Signed, Almyra Deforest, PA  01/10/2020 11:17 PM    [facility]

## 2020-01-08 NOTE — Patient Instructions (Signed)

## 2020-01-15 ENCOUNTER — Ambulatory Visit: Payer: Medicare HMO

## 2020-01-27 DIAGNOSIS — I480 Paroxysmal atrial fibrillation: Secondary | ICD-10-CM | POA: Diagnosis not present

## 2020-01-27 DIAGNOSIS — Z950 Presence of cardiac pacemaker: Secondary | ICD-10-CM | POA: Diagnosis not present

## 2020-01-27 DIAGNOSIS — J449 Chronic obstructive pulmonary disease, unspecified: Secondary | ICD-10-CM | POA: Diagnosis not present

## 2020-01-27 DIAGNOSIS — I255 Ischemic cardiomyopathy: Secondary | ICD-10-CM | POA: Diagnosis not present

## 2020-01-27 DIAGNOSIS — I110 Hypertensive heart disease with heart failure: Secondary | ICD-10-CM | POA: Diagnosis not present

## 2020-01-27 DIAGNOSIS — D6869 Other thrombophilia: Secondary | ICD-10-CM | POA: Diagnosis not present

## 2020-01-27 DIAGNOSIS — D509 Iron deficiency anemia, unspecified: Secondary | ICD-10-CM | POA: Diagnosis not present

## 2020-01-27 DIAGNOSIS — R2689 Other abnormalities of gait and mobility: Secondary | ICD-10-CM | POA: Diagnosis not present

## 2020-01-27 DIAGNOSIS — I509 Heart failure, unspecified: Secondary | ICD-10-CM | POA: Diagnosis not present

## 2020-01-29 DIAGNOSIS — D6869 Other thrombophilia: Secondary | ICD-10-CM | POA: Diagnosis not present

## 2020-01-29 DIAGNOSIS — R946 Abnormal results of thyroid function studies: Secondary | ICD-10-CM | POA: Diagnosis not present

## 2020-01-29 DIAGNOSIS — R7301 Impaired fasting glucose: Secondary | ICD-10-CM | POA: Diagnosis not present

## 2020-01-29 DIAGNOSIS — I509 Heart failure, unspecified: Secondary | ICD-10-CM | POA: Diagnosis not present

## 2020-01-29 DIAGNOSIS — R634 Abnormal weight loss: Secondary | ICD-10-CM | POA: Diagnosis not present

## 2020-01-29 DIAGNOSIS — E7849 Other hyperlipidemia: Secondary | ICD-10-CM | POA: Diagnosis not present

## 2020-02-20 ENCOUNTER — Ambulatory Visit (INDEPENDENT_AMBULATORY_CARE_PROVIDER_SITE_OTHER): Payer: Medicare HMO | Admitting: *Deleted

## 2020-02-20 DIAGNOSIS — I255 Ischemic cardiomyopathy: Secondary | ICD-10-CM

## 2020-02-20 LAB — CUP PACEART REMOTE DEVICE CHECK
Battery Remaining Longevity: 137 mo
Battery Remaining Percentage: 95.5 %
Battery Voltage: 3.02 V
Brady Statistic RV Percent Paced: 7.5 %
Date Time Interrogation Session: 20210325020017
Implantable Lead Implant Date: 20091103
Implantable Lead Implant Date: 20091103
Implantable Lead Location: 753859
Implantable Lead Location: 753860
Implantable Lead Model: 5076
Implantable Lead Model: 6947
Implantable Pulse Generator Implant Date: 20161117
Lead Channel Impedance Value: 430 Ohm
Lead Channel Pacing Threshold Amplitude: 0.75 V
Lead Channel Pacing Threshold Pulse Width: 0.4 ms
Lead Channel Sensing Intrinsic Amplitude: 12 mV
Lead Channel Setting Pacing Amplitude: 2.5 V
Lead Channel Setting Pacing Pulse Width: 0.4 ms
Lead Channel Setting Sensing Sensitivity: 2 mV
Pulse Gen Model: 1240
Pulse Gen Serial Number: 7804513

## 2020-02-21 NOTE — Progress Notes (Signed)
ICD Remote  

## 2020-04-22 DIAGNOSIS — E7800 Pure hypercholesterolemia, unspecified: Secondary | ICD-10-CM | POA: Diagnosis not present

## 2020-04-22 DIAGNOSIS — H524 Presbyopia: Secondary | ICD-10-CM | POA: Diagnosis not present

## 2020-04-22 DIAGNOSIS — Z0100 Encounter for examination of eyes and vision without abnormal findings: Secondary | ICD-10-CM | POA: Diagnosis not present

## 2020-05-21 ENCOUNTER — Ambulatory Visit (INDEPENDENT_AMBULATORY_CARE_PROVIDER_SITE_OTHER): Payer: Medicare HMO | Admitting: *Deleted

## 2020-05-21 DIAGNOSIS — I255 Ischemic cardiomyopathy: Secondary | ICD-10-CM | POA: Diagnosis not present

## 2020-05-21 DIAGNOSIS — Z45018 Encounter for adjustment and management of other part of cardiac pacemaker: Secondary | ICD-10-CM

## 2020-05-21 LAB — CUP PACEART REMOTE DEVICE CHECK
Battery Remaining Longevity: 138 mo
Battery Remaining Percentage: 95.5 %
Battery Voltage: 3.02 V
Brady Statistic RV Percent Paced: 7.9 %
Date Time Interrogation Session: 20210624020014
Implantable Lead Implant Date: 20091103
Implantable Lead Implant Date: 20091103
Implantable Lead Location: 753859
Implantable Lead Location: 753860
Implantable Lead Model: 5076
Implantable Lead Model: 6947
Implantable Pulse Generator Implant Date: 20161117
Lead Channel Impedance Value: 450 Ohm
Lead Channel Pacing Threshold Amplitude: 0.75 V
Lead Channel Pacing Threshold Pulse Width: 0.4 ms
Lead Channel Sensing Intrinsic Amplitude: 12 mV
Lead Channel Setting Pacing Amplitude: 2.5 V
Lead Channel Setting Pacing Pulse Width: 0.4 ms
Lead Channel Setting Sensing Sensitivity: 2 mV
Pulse Gen Model: 1240
Pulse Gen Serial Number: 7804513

## 2020-05-22 NOTE — Progress Notes (Signed)
Remote pacemaker transmission.   

## 2020-06-14 NOTE — Progress Notes (Signed)
Cardiology Office Note Date:  06/15/2020  Patient ID:  Jenner, Rosier [birth year], MRN [id number] PCP:  Crist Infante, MD  Cardiologist:  Dr. [name] EP: Dr. Rayann Heman   Chief Complaint: over due EP/device visit  History of Present Illness: EDU ON is a 86 y.o. male with history of CAD ( CABG 2002), Afib (not on a/c 2/2 GIB characterized as "major", by cardiology note and recurrent), ICM, Chronic CHF (systolic), ICD >> PPM, HTN, HLD.  He comes in today to be seen for Dr. Rayann Heman, last seen by EP service April 2019 by A. Lynnell Jude, NP.   His AFib described as permanent and rate controlled., not a candidate for watchman or a/c 2/2 GIBs  I note that in 2016 when his ICD was nearing ERI, was discussed given no V arrhythmias, an in appropriate therapy for AF only, LVEF improved to 40%,  and advanced age, though did have 11% V pacing, decided to replace his device with PPM.  He is accompanied by his daughter.  He continues to live independently, though his son visits every morning and his daughter every evening.  His wife died early this year, he downsized to a 1 bedroom apt and doing well with his small dog.  Walks him daily and helps keeps him company.   He has had a couple of falls, these have been trip/mechanical, no reports of fainting No CP, palpitations or SOB  He reports his PMD keeps a good eye on him,, mostly bothered by lower GI issues, diarrhea He has noted edema and they report this is not new for years and at his baseline   Device information SJM dual chamber PPM generator 09/2015 Leads are MDT RV lead is a 6947 (both RA and RV are from 2009)  AAD Amiodarone very remotely, has not been on this for years   Past Medical History:  Diagnosis Date  . CAD (coronary artery disease)    Old inferolateral MI s/p CABG in 2002  . CAP (community acquired pneumonia) 10/03/2011  . Cardiomyopathy    s/p ICD 09/30/08; EF 35 to 40% per echo in October 2021  . Chronic systolic CHF  (congestive heart failure) ([facility]) 07/24/2013  . Colon cancer ([facility])    s/p chemo, RT, sugery  . Colorectal anastomotic stricture   . Diverticulosis   . ED (erectile dysfunction)   . GERD (gastroesophageal reflux disease)    rarely  . HTN (hypertension)   . ICD (implantable cardiac defibrillator) discharge October 2012   ICD shock for afib with RVR  . Iron deficiency anemia   . Paroxysmal atrial fibrillation ([facility])    controlled with amiodarone, not a coumadin candidate due to GI bleeding    Past Surgical History:  Procedure Laterality Date  . APPENDECTOMY    . BACK SURGERY    . BIOPSY  09/22/2018   Procedure: BIOPSY;  Surgeon: Gatha Mayer, MD;  Location: [facility];  Service: Endoscopy;;  . CARDIAC CATHETERIZATION  11/07/2000   EF 32%  . CARDIAC DEFIBRILLATOR PLACEMENT  09/30/08   MDT dual chamber ICD placed by Dr Rayann Heman  . CARDIOVASCULAR STRESS TEST  08/06/2008   EF 30%  . CHOLECYSTECTOMY    . COLON RESECTION     x2  . COLONOSCOPY     multiple  . COLONOSCOPY N/A 09/22/2018   Procedure: COLONOSCOPY;  Surgeon: Gatha Mayer, MD;  Location: [facility];  Service: Endoscopy;  Laterality: N/A;  . CORONARY ARTERY BYPASS GRAFT    .  EP IMPLANTABLE DEVICE N/A 10/15/2015   Procedure:  PPM Generator Changeout;  Surgeon: [name], MD;  Location: [facility];  Service: Cardiovascular;  Laterality: N/A;  . HERNIA REPAIR    . US ECHOCARDIOGRAPHY  08/11/2008   EF 30-35%  . VASECTOMY      Current Outpatient Medications  Medication Sig Dispense Refill  . BESIVANCE 0.6 % SUSP Place 1 drop into the right eye 3 (three) times daily.  1  . cholecalciferol (VITAMIN D) 1000 UNITS tablet Take 1,000 Units by mouth daily.     . digoxin (LANOXIN) 0.125 MG tablet Take 0.5 tablets (62.5 mcg total) by mouth daily. 45 tablet 3  . diphenoxylate-atropine (LOMOTIL) 2.5-0.025 MG tablet     . DUREZOL 0.05 % EMUL Place 1 drop into the right eye 3 (three) times daily.  1  . furosemide (LASIX)  40 MG tablet TAKE 1 TABLET FIVE DAYS A WEEK AND 1/2 TABLET ON SATURDAY AND SUNDAY 78 tablet 2  . metoprolol succinate (TOPROL-XL) 100 MG 24 hr tablet TAKE 1 TABLET (100 MG TOTAL) BY MOUTH DAILY WITH OR IMMEDIATELY FOLLOWING A MEAL 90 tablet 3  . Multiple Vitamins-Minerals (MULTIVITAMINS THER. W/MINERALS) TABS Take 1 tablet by mouth daily.      . nitroGLYCERIN (NITROSTAT) 0.4 MG SL tablet Place 1 tablet (0.4 mg total) under the tongue every 5 (five) minutes as needed for chest pain. 25 tablet 6  . PROAIR HFA 108 (90 BASE) MCG/ACT inhaler Inhale 2 puffs into the lungs every 4 (four) hours as needed for shortness of breath.     . ramipril (ALTACE) 10 MG capsule Take 1 capsule (10 mg total) by mouth daily. 90 capsule 3  . spironolactone (ALDACTONE) 25 MG tablet Take 0.5 tablets (12.5 mg total) by mouth daily. Please schedule annual appt with Dr. [name] for refills. [phone]. 1st attempt. 45 tablet 3  . temazepam (RESTORIL) 7.5 MG capsule Take 1 capsule (7.5 mg total) by mouth at bedtime as needed for sleep. 30 capsule 0  . vitamin B-12 (CYANOCOBALAMIN) 1000 MCG tablet Take 1,000 mcg by mouth daily.     No current facility-administered medications for this visit.    Allergies:   Iron, Lescol [fluvastatin sodium], Pentazocine lactate, Vytorin [ezetimibe-simvastatin], and Aspirin   Social History:  The patient  reports that he has quit smoking. His smoking use included cigars. He has never used smokeless tobacco. He reports that he does not drink alcohol and does not use drugs.   Family History:  The patient's family history includes Colon cancer in his father; Colon polyps in his brother; Diabetes in his sister; Pancreatic cancer (age of onset: 18) in his brother.  ROS:  Please see the history of present illness.  All other systems are reviewed and otherwise negative.   PHYSICAL EXAM:  VS:  BP (!) 118/58   Pulse 79   Ht 5\' 11"  (1.803 m)   Wt 137 lb (62.1 kg)   BMI 19.11 kg/m  BMI: Body mass  index is 19.11 kg/m. Well nourished, well developed, in no acute distress  HEENT: normocephalic, atraumatic  Neck: no JVD, carotid bruits or masses Cardiac:  irreg-irreg; no significant murmurs, no rubs, or gallops Lungs: CTA b/l, no wheezing, rhonchi or rales  Abd: soft, nontender MS: no deformity, age appropriate atrophy Ext: 1+ edema to mid shin b/l, chronic looking skin changes Skin: warm and dry, no rash Neuro:  No gross deficits appreciated Psych: euthymic mood, full affect  PPM site is  stable, no tethering or discomfort   EKG:  Done today and reviewed by myself shows  Afib 79bpm  PPM interrogation done today and reviewed by myself: Battery and lead measurements are good VP 8.3% Programmed VVI 50 HR histograms look good 50-100, predominantly 60-90 Has HVR episodes these were grouped July 4-5, longest 46 seconds, cycle lengths are slightly irregular, morphology though not quite the same, some more like his baseline morphology then others and suspect these are all AF with biref RVR  His VP signals are tiny, on one strip has a single VP without a discernable EGM signal Thresholds today are good/stable confirmed with manual pulse, impedances and sensing stable He is not device dependent    01/04/2016: TTE Study Conclusions  - Left ventricle: Inferior wall hypokinesis The cavity size was  moderately dilated. Wall thickness was increased in a pattern of  mild LVH. Systolic function was mildly to moderately reduced. The  estimated ejection fraction was in the range of 40% to 45%.  - Aortic valve: There was mild regurgitation.  - Mitral valve: There was moderate regurgitation.  - Left atrium: The atrium was moderately dilated.  - Right ventricle: The cavity size was mildly dilated.  - Right atrium: The atrium was mildly dilated.  - Atrial septum: No defect or patent foramen ovale was identified.  - Pulmonary arteries: PA peak pressure: 71 mm Hg (S).    Recent  Labs: No results found for requested labs within last 8760 hours.  No results found for requested labs within last 8760 hours.   CrCl cannot be calculated (Patient's most recent lab result is older than the maximum 21 days allowed.).   Wt Readings from Last 3 Encounters:  06/15/20 137 lb (62.1 kg)  01/08/20 140 lb (63.5 kg)  12/03/18 140 lb (63.5 kg)     Other studies reviewed: Additional studies/records reviewed today include: summarized above  ASSESSMENT AND PLAN:  1. PPM     Stable measurements, no programming changes are made  2. ICM 3. Chronic CHF     He has edema, but reports at his baseline and unchanged for years     No SOB, lungs are clear     On BB/ACE, diurtetic tx  Needs a dig level He has an appt coming up with his PMD soon for labs, his daughter asked about getting it done there.  She will look to see when his appt is, and let us know, if it is in the next month or so, will request it be done then, we will request his labs from Dr. Silvestre Mesi office by his Rolley Sims looks like he has had labs done in [facility]  4. CAD     No anginal complaints     Following with Dr. Madaline Guthrie  5. HTN     Looks OK, no changes  6. Permanent AFib    CHA2DS2Vasc is 6, not on a/c 2/2 recurrent GIB history     rate controlled   Disposition: F/u with Q 3 mo remotes, in clinic one year, sooner if needed  Current medicines are reviewed at length with the patient today.  The patient did not have any concerns regarding medicines.  Venetia Night, PA-C 06/15/2020 5:37 PM     [address] [place] [address] [phone] (office)  [phone] (fax)

## 2020-06-15 ENCOUNTER — Other Ambulatory Visit: Payer: Self-pay

## 2020-06-15 ENCOUNTER — Ambulatory Visit: Payer: Medicare HMO | Admitting: Physician Assistant

## 2020-06-15 VITALS — BP 118/58 | HR 79 | Ht 71.0 in | Wt 137.0 lb

## 2020-06-15 DIAGNOSIS — I2510 Atherosclerotic heart disease of native coronary artery without angina pectoris: Secondary | ICD-10-CM

## 2020-06-15 DIAGNOSIS — I5022 Chronic systolic (congestive) heart failure: Secondary | ICD-10-CM

## 2020-06-15 DIAGNOSIS — Z950 Presence of cardiac pacemaker: Secondary | ICD-10-CM | POA: Diagnosis not present

## 2020-06-15 DIAGNOSIS — I10 Essential (primary) hypertension: Secondary | ICD-10-CM | POA: Diagnosis not present

## 2020-06-15 DIAGNOSIS — I4821 Permanent atrial fibrillation: Secondary | ICD-10-CM

## 2020-06-15 DIAGNOSIS — I255 Ischemic cardiomyopathy: Secondary | ICD-10-CM | POA: Diagnosis not present

## 2020-06-15 NOTE — Patient Instructions (Signed)
Medication Instructions:   Your physician recommends that you continue on your current medications as directed. Please refer to the Current Medication list given to you today.  *If you need a refill on your cardiac medications before your next appointment, please call your pharmacy*   Lab Work:  [place] DATE TO BE DRAWN FROM PRIMARY CARE  If you have labs (blood work) drawn today and your tests are completely normal, you will receive your results only by: Marland Kitchen MyChart Message (if you have MyChart) OR . A paper copy in the mail If you have any lab test that is abnormal or we need to change your treatment, we will call you to review the results.   Testing/Procedures: NONE ORDERED  TODAY     Follow-Up: At [facility], you and your health needs are our priority.  As part of our continuing mission to provide you with exceptional heart care, we have created designated Provider Care Teams.  These Care Teams include your primary Cardiologist (physician) and Advanced Practice Providers (APPs -  Physician Assistants and Nurse Practitioners) who all work together to provide you with the care you need, when you need it.  We recommend signing up for the patient portal called "MyChart".  Sign up information is provided on this After Visit Summary.  MyChart is used to connect with patients for Virtual Visits (Telemedicine).  Patients are able to view lab/test results, encounter notes, upcoming appointments, etc.  Non-urgent messages can be sent to your provider as well.   To learn more about what you can do with MyChart, go to [email or web address].    Your next appointment:   1 year(s)  The format for your next appointment:   In Person  Provider:   You may see Dr. Rayann Heman  or one of the following Advanced Practice Providers on your designated Care Team:    Chanetta Marshall, NP  Tommye Standard, PA-C  Legrand Como "Oda Kilts, [place]    Other Instructions

## 2020-06-17 ENCOUNTER — Telehealth: Payer: Self-pay | Admitting: Physician Assistant

## 2020-06-17 NOTE — Telephone Encounter (Signed)
[name] [name] is calling requesting he get his labs that Renee advised him she is wanting him to have performed done at his PCP's office instead of coming back into our office due to conivence. Please advise.

## 2020-06-17 NOTE — Telephone Encounter (Signed)
According to Renee's office visit note pt needs a Digoxin level for Dx I48.81, [phone] Spoke with patient who does not know when his appt is or when he is having lab drawn for his physical at Dr [employer] office.  Advised I will call their office to find out when his appt is scheduled.   Confirmed with [facility] pt' appt for lab is not until September 30th at 8:15 and appt with Dr Joylene Draft is 10/7 ar 10 AM.  Will forward this information to Renee to see if she is OK with pt waiting until them for labwork.

## 2020-06-23 NOTE — Telephone Encounter (Signed)
Pt is aware per [name] [name] - OK to wait for Digoxin level until he has blood work at Dr [employer] office 9/30.  He was grateful for the call back and information.

## 2020-08-16 ENCOUNTER — Other Ambulatory Visit: Payer: Self-pay | Admitting: Physician Assistant

## 2020-08-20 ENCOUNTER — Ambulatory Visit (INDEPENDENT_AMBULATORY_CARE_PROVIDER_SITE_OTHER): Payer: Medicare HMO | Admitting: Emergency Medicine

## 2020-08-20 DIAGNOSIS — D0422 Carcinoma in situ of skin of left ear and external auricular canal: Secondary | ICD-10-CM | POA: Diagnosis not present

## 2020-08-20 DIAGNOSIS — L0212 Furuncle of neck: Secondary | ICD-10-CM | POA: Diagnosis not present

## 2020-08-20 DIAGNOSIS — C44222 Squamous cell carcinoma of skin of right ear and external auricular canal: Secondary | ICD-10-CM | POA: Diagnosis not present

## 2020-08-20 DIAGNOSIS — I255 Ischemic cardiomyopathy: Secondary | ICD-10-CM

## 2020-08-20 DIAGNOSIS — Z85828 Personal history of other malignant neoplasm of skin: Secondary | ICD-10-CM | POA: Diagnosis not present

## 2020-08-20 DIAGNOSIS — Z45018 Encounter for adjustment and management of other part of cardiac pacemaker: Secondary | ICD-10-CM

## 2020-08-21 LAB — CUP PACEART REMOTE DEVICE CHECK
Battery Remaining Longevity: 139 mo
Battery Remaining Percentage: 95.5 %
Battery Voltage: 3.02 V
Brady Statistic RV Percent Paced: 22 %
Date Time Interrogation Session: 20210923020014
Implantable Lead Implant Date: 20091103
Implantable Lead Implant Date: 20091103
Implantable Lead Location: 753859
Implantable Lead Location: 753860
Implantable Lead Model: 5076
Implantable Lead Model: 6947
Implantable Pulse Generator Implant Date: 20161117
Lead Channel Impedance Value: 450 Ohm
Lead Channel Pacing Threshold Amplitude: 1 V
Lead Channel Pacing Threshold Pulse Width: 0.4 ms
Lead Channel Sensing Intrinsic Amplitude: 12 mV
Lead Channel Setting Pacing Amplitude: 2.5 V
Lead Channel Setting Pacing Pulse Width: 0.4 ms
Lead Channel Setting Sensing Sensitivity: 2 mV
Pulse Gen Model: 1240
Pulse Gen Serial Number: 7804513

## 2020-08-25 NOTE — Progress Notes (Signed)
Remote pacemaker transmission.   

## 2020-08-27 DIAGNOSIS — E559 Vitamin D deficiency, unspecified: Secondary | ICD-10-CM | POA: Diagnosis not present

## 2020-08-27 DIAGNOSIS — R7301 Impaired fasting glucose: Secondary | ICD-10-CM | POA: Diagnosis not present

## 2020-08-27 DIAGNOSIS — I110 Hypertensive heart disease with heart failure: Secondary | ICD-10-CM | POA: Diagnosis not present

## 2020-08-27 DIAGNOSIS — E785 Hyperlipidemia, unspecified: Secondary | ICD-10-CM | POA: Diagnosis not present

## 2020-08-27 DIAGNOSIS — Z125 Encounter for screening for malignant neoplasm of prostate: Secondary | ICD-10-CM | POA: Diagnosis not present

## 2020-09-03 DIAGNOSIS — Z0000 Encounter for general adult medical examination without abnormal findings: Secondary | ICD-10-CM | POA: Diagnosis not present

## 2020-09-03 DIAGNOSIS — I255 Ischemic cardiomyopathy: Secondary | ICD-10-CM | POA: Diagnosis not present

## 2020-09-03 DIAGNOSIS — Z950 Presence of cardiac pacemaker: Secondary | ICD-10-CM | POA: Diagnosis not present

## 2020-09-03 DIAGNOSIS — Z23 Encounter for immunization: Secondary | ICD-10-CM | POA: Diagnosis not present

## 2020-09-03 DIAGNOSIS — I110 Hypertensive heart disease with heart failure: Secondary | ICD-10-CM | POA: Diagnosis not present

## 2020-09-03 DIAGNOSIS — I480 Paroxysmal atrial fibrillation: Secondary | ICD-10-CM | POA: Diagnosis not present

## 2020-09-03 DIAGNOSIS — J449 Chronic obstructive pulmonary disease, unspecified: Secondary | ICD-10-CM | POA: Diagnosis not present

## 2020-09-03 DIAGNOSIS — D6869 Other thrombophilia: Secondary | ICD-10-CM | POA: Diagnosis not present

## 2020-09-03 DIAGNOSIS — I2510 Atherosclerotic heart disease of native coronary artery without angina pectoris: Secondary | ICD-10-CM | POA: Diagnosis not present

## 2020-09-03 DIAGNOSIS — D509 Iron deficiency anemia, unspecified: Secondary | ICD-10-CM | POA: Diagnosis not present

## 2020-09-21 DIAGNOSIS — Z85828 Personal history of other malignant neoplasm of skin: Secondary | ICD-10-CM | POA: Diagnosis not present

## 2020-09-21 DIAGNOSIS — C44229 Squamous cell carcinoma of skin of left ear and external auricular canal: Secondary | ICD-10-CM | POA: Diagnosis not present

## 2020-10-05 ENCOUNTER — Other Ambulatory Visit: Payer: Self-pay | Admitting: Physician Assistant

## 2020-10-05 DIAGNOSIS — Z85828 Personal history of other malignant neoplasm of skin: Secondary | ICD-10-CM | POA: Diagnosis not present

## 2020-10-05 DIAGNOSIS — C44222 Squamous cell carcinoma of skin of right ear and external auricular canal: Secondary | ICD-10-CM | POA: Diagnosis not present

## 2020-10-05 DIAGNOSIS — C4442 Squamous cell carcinoma of skin of scalp and neck: Secondary | ICD-10-CM | POA: Diagnosis not present

## 2020-10-12 ENCOUNTER — Other Ambulatory Visit: Payer: Self-pay | Admitting: Physician Assistant

## 2020-10-23 ENCOUNTER — Encounter (HOSPITAL_COMMUNITY): Payer: Self-pay | Admitting: Internal Medicine

## 2020-10-23 ENCOUNTER — Other Ambulatory Visit: Payer: Self-pay

## 2020-10-23 ENCOUNTER — Inpatient Hospital Stay (HOSPITAL_COMMUNITY)
Admission: EM | Admit: 2020-10-23 | Discharge: 2020-11-04 | DRG: 871 | Disposition: A | Discharge status: Skilled Nursing Facility | Payer: Medicare HMO | Attending: Internal Medicine | Admitting: Internal Medicine

## 2020-10-23 ENCOUNTER — Emergency Department (HOSPITAL_COMMUNITY): Payer: Medicare HMO

## 2020-10-23 DIAGNOSIS — R0602 Shortness of breath: Secondary | ICD-10-CM

## 2020-10-23 DIAGNOSIS — A4189 Other specified sepsis: Principal | ICD-10-CM | POA: Diagnosis present

## 2020-10-23 DIAGNOSIS — J9600 Acute respiratory failure, unspecified whether with hypoxia or hypercapnia: Secondary | ICD-10-CM | POA: Diagnosis not present

## 2020-10-23 DIAGNOSIS — J988 Other specified respiratory disorders: Secondary | ICD-10-CM | POA: Diagnosis not present

## 2020-10-23 DIAGNOSIS — Z888 Allergy status to other drugs, medicaments and biological substances status: Secondary | ICD-10-CM

## 2020-10-23 DIAGNOSIS — I4891 Unspecified atrial fibrillation: Secondary | ICD-10-CM

## 2020-10-23 DIAGNOSIS — I252 Old myocardial infarction: Secondary | ICD-10-CM

## 2020-10-23 DIAGNOSIS — L89152 Pressure ulcer of sacral region, stage 2: Secondary | ICD-10-CM | POA: Diagnosis present

## 2020-10-23 DIAGNOSIS — I4821 Permanent atrial fibrillation: Secondary | ICD-10-CM

## 2020-10-23 DIAGNOSIS — J9811 Atelectasis: Secondary | ICD-10-CM | POA: Diagnosis not present

## 2020-10-23 DIAGNOSIS — J1282 Pneumonia due to coronavirus disease 2019: Secondary | ICD-10-CM | POA: Diagnosis present

## 2020-10-23 DIAGNOSIS — Z951 Presence of aortocoronary bypass graft: Secondary | ICD-10-CM | POA: Diagnosis not present

## 2020-10-23 DIAGNOSIS — D539 Nutritional anemia, unspecified: Secondary | ICD-10-CM | POA: Diagnosis present

## 2020-10-23 DIAGNOSIS — I2510 Atherosclerotic heart disease of native coronary artery without angina pectoris: Secondary | ICD-10-CM | POA: Diagnosis present

## 2020-10-23 DIAGNOSIS — R41841 Cognitive communication deficit: Secondary | ICD-10-CM | POA: Diagnosis not present

## 2020-10-23 DIAGNOSIS — J9601 Acute respiratory failure with hypoxia: Secondary | ICD-10-CM | POA: Diagnosis present

## 2020-10-23 DIAGNOSIS — Z800 Family history of malignant neoplasm of digestive organs: Secondary | ICD-10-CM

## 2020-10-23 DIAGNOSIS — I517 Cardiomegaly: Secondary | ICD-10-CM | POA: Diagnosis not present

## 2020-10-23 DIAGNOSIS — Z9221 Personal history of antineoplastic chemotherapy: Secondary | ICD-10-CM | POA: Diagnosis not present

## 2020-10-23 DIAGNOSIS — R6520 Severe sepsis without septic shock: Secondary | ICD-10-CM | POA: Diagnosis not present

## 2020-10-23 DIAGNOSIS — I25709 Atherosclerosis of coronary artery bypass graft(s), unspecified, with unspecified angina pectoris: Secondary | ICD-10-CM | POA: Diagnosis not present

## 2020-10-23 DIAGNOSIS — R338 Other retention of urine: Secondary | ICD-10-CM | POA: Diagnosis present

## 2020-10-23 DIAGNOSIS — I5022 Chronic systolic (congestive) heart failure: Secondary | ICD-10-CM | POA: Diagnosis not present

## 2020-10-23 DIAGNOSIS — R2689 Other abnormalities of gait and mobility: Secondary | ICD-10-CM | POA: Diagnosis not present

## 2020-10-23 DIAGNOSIS — K219 Gastro-esophageal reflux disease without esophagitis: Secondary | ICD-10-CM | POA: Diagnosis present

## 2020-10-23 DIAGNOSIS — I255 Ischemic cardiomyopathy: Secondary | ICD-10-CM | POA: Diagnosis present

## 2020-10-23 DIAGNOSIS — R2681 Unsteadiness on feet: Secondary | ICD-10-CM | POA: Diagnosis not present

## 2020-10-23 DIAGNOSIS — R262 Difficulty in walking, not elsewhere classified: Secondary | ICD-10-CM | POA: Diagnosis not present

## 2020-10-23 DIAGNOSIS — K921 Melena: Secondary | ICD-10-CM | POA: Diagnosis present

## 2020-10-23 DIAGNOSIS — Z9049 Acquired absence of other specified parts of digestive tract: Secondary | ICD-10-CM

## 2020-10-23 DIAGNOSIS — J069 Acute upper respiratory infection, unspecified: Secondary | ICD-10-CM | POA: Diagnosis not present

## 2020-10-23 DIAGNOSIS — U071 COVID-19: Secondary | ICD-10-CM | POA: Diagnosis not present

## 2020-10-23 DIAGNOSIS — Z95810 Presence of automatic (implantable) cardiac defibrillator: Secondary | ICD-10-CM

## 2020-10-23 DIAGNOSIS — K922 Gastrointestinal hemorrhage, unspecified: Secondary | ICD-10-CM | POA: Diagnosis not present

## 2020-10-23 DIAGNOSIS — E785 Hyperlipidemia, unspecified: Secondary | ICD-10-CM | POA: Diagnosis present

## 2020-10-23 DIAGNOSIS — D7589 Other specified diseases of blood and blood-forming organs: Secondary | ICD-10-CM | POA: Diagnosis present

## 2020-10-23 DIAGNOSIS — L8990 Pressure ulcer of unspecified site, unspecified stage: Secondary | ICD-10-CM | POA: Insufficient documentation

## 2020-10-23 DIAGNOSIS — D696 Thrombocytopenia, unspecified: Secondary | ICD-10-CM | POA: Diagnosis present

## 2020-10-23 DIAGNOSIS — R1312 Dysphagia, oropharyngeal phase: Secondary | ICD-10-CM | POA: Diagnosis not present

## 2020-10-23 DIAGNOSIS — R000 Tachycardia, unspecified: Secondary | ICD-10-CM

## 2020-10-23 DIAGNOSIS — R5381 Other malaise: Secondary | ICD-10-CM | POA: Diagnosis present

## 2020-10-23 DIAGNOSIS — I110 Hypertensive heart disease with heart failure: Secondary | ICD-10-CM | POA: Diagnosis present

## 2020-10-23 DIAGNOSIS — F05 Delirium due to known physiological condition: Secondary | ICD-10-CM | POA: Diagnosis not present

## 2020-10-23 DIAGNOSIS — Z950 Presence of cardiac pacemaker: Secondary | ICD-10-CM

## 2020-10-23 DIAGNOSIS — J189 Pneumonia, unspecified organism: Secondary | ICD-10-CM | POA: Diagnosis not present

## 2020-10-23 DIAGNOSIS — N401 Enlarged prostate with lower urinary tract symptoms: Secondary | ICD-10-CM | POA: Diagnosis present

## 2020-10-23 DIAGNOSIS — J811 Chronic pulmonary edema: Secondary | ICD-10-CM | POA: Diagnosis not present

## 2020-10-23 DIAGNOSIS — M6281 Muscle weakness (generalized): Secondary | ICD-10-CM | POA: Diagnosis not present

## 2020-10-23 DIAGNOSIS — E8809 Other disorders of plasma-protein metabolism, not elsewhere classified: Secondary | ICD-10-CM | POA: Diagnosis present

## 2020-10-23 DIAGNOSIS — J90 Pleural effusion, not elsewhere classified: Secondary | ICD-10-CM | POA: Diagnosis not present

## 2020-10-23 DIAGNOSIS — I088 Other rheumatic multiple valve diseases: Secondary | ICD-10-CM | POA: Diagnosis present

## 2020-10-23 DIAGNOSIS — Z7401 Bed confinement status: Secondary | ICD-10-CM | POA: Diagnosis not present

## 2020-10-23 DIAGNOSIS — Z79899 Other long term (current) drug therapy: Secondary | ICD-10-CM

## 2020-10-23 DIAGNOSIS — Z833 Family history of diabetes mellitus: Secondary | ICD-10-CM

## 2020-10-23 DIAGNOSIS — Z8371 Family history of colonic polyps: Secondary | ICD-10-CM

## 2020-10-23 DIAGNOSIS — I361 Nonrheumatic tricuspid (valve) insufficiency: Secondary | ICD-10-CM | POA: Diagnosis not present

## 2020-10-23 DIAGNOSIS — M2550 Pain in unspecified joint: Secondary | ICD-10-CM | POA: Diagnosis not present

## 2020-10-23 DIAGNOSIS — I371 Nonrheumatic pulmonary valve insufficiency: Secondary | ICD-10-CM | POA: Diagnosis not present

## 2020-10-23 DIAGNOSIS — K5900 Constipation, unspecified: Secondary | ICD-10-CM | POA: Diagnosis not present

## 2020-10-23 DIAGNOSIS — Z85038 Personal history of other malignant neoplasm of large intestine: Secondary | ICD-10-CM | POA: Diagnosis not present

## 2020-10-23 DIAGNOSIS — A419 Sepsis, unspecified organism: Secondary | ICD-10-CM | POA: Diagnosis present

## 2020-10-23 DIAGNOSIS — I472 Ventricular tachycardia: Secondary | ICD-10-CM | POA: Diagnosis not present

## 2020-10-23 DIAGNOSIS — Z87891 Personal history of nicotine dependence: Secondary | ICD-10-CM

## 2020-10-23 DIAGNOSIS — Z7901 Long term (current) use of anticoagulants: Secondary | ICD-10-CM

## 2020-10-23 LAB — CBC WITH DIFFERENTIAL/PLATELET
Abs Immature Granulocytes: 0.04 10*3/uL (ref 0.00–0.07)
Basophils Absolute: 0 10*3/uL (ref 0.0–0.1)
Basophils Relative: 0 %
Eosinophils Absolute: 0 10*3/uL (ref 0.0–0.5)
Eosinophils Relative: 0 %
HCT: 29.3 % — ABNORMAL LOW (ref 39.0–52.0)
Hemoglobin: 9.8 g/dL — ABNORMAL LOW (ref 13.0–17.0)
Immature Granulocytes: 0 %
Lymphocytes Relative: 3 %
Lymphs Abs: 0.3 10*3/uL — ABNORMAL LOW (ref 0.7–4.0)
MCH: 33.6 pg (ref 26.0–34.0)
MCHC: 33.4 g/dL (ref 30.0–36.0)
MCV: 100.3 fL — ABNORMAL HIGH (ref 80.0–100.0)
Monocytes Absolute: 0.6 10*3/uL (ref 0.1–1.0)
Monocytes Relative: 6 %
Neutro Abs: 8.5 10*3/uL — ABNORMAL HIGH (ref 1.7–7.7)
Neutrophils Relative %: 91 %
Platelets: 136 10*3/uL — ABNORMAL LOW (ref 150–400)
RBC: 2.92 MIL/uL — ABNORMAL LOW (ref 4.22–5.81)
RDW: 14.3 % (ref 11.5–15.5)
WBC: 9.4 10*3/uL (ref 4.0–10.5)
nRBC: 0 % (ref 0.0–0.2)

## 2020-10-23 LAB — TROPONIN I (HIGH SENSITIVITY)
Troponin I (High Sensitivity): 63 ng/L — ABNORMAL HIGH (ref <18)
Troponin I (High Sensitivity): 99 ng/L — ABNORMAL HIGH (ref <18)

## 2020-10-23 LAB — LACTATE DEHYDROGENASE: LDH: 201 U/L — ABNORMAL HIGH (ref 98–192)

## 2020-10-23 LAB — TYPE AND SCREEN
ABO/RH(D): O POS
Antibody Screen: NEGATIVE

## 2020-10-23 LAB — COMPREHENSIVE METABOLIC PANEL
ALT: 22 U/L (ref 0–44)
AST: 37 U/L (ref 15–41)
Albumin: 3.3 g/dL — ABNORMAL LOW (ref 3.5–5.0)
Alkaline Phosphatase: 31 U/L — ABNORMAL LOW (ref 38–126)
Anion gap: 10 (ref 5–15)
BUN: 15 mg/dL (ref 8–23)
CO2: 26 mmol/L (ref 22–32)
Calcium: 9.1 mg/dL (ref 8.9–10.3)
Chloride: 105 mmol/L (ref 98–111)
Creatinine, Ser: 1.12 mg/dL (ref 0.61–1.24)
GFR, Estimated: >60 mL/min (ref >60)
Glucose, Bld: 118 mg/dL — ABNORMAL HIGH (ref 70–99)
Potassium: 3.7 mmol/L (ref 3.5–5.1)
Sodium: 141 mmol/L (ref 135–145)
Total Bilirubin: 1.2 mg/dL (ref 0.3–1.2)
Total Protein: 5.9 g/dL — ABNORMAL LOW (ref 6.5–8.1)

## 2020-10-23 LAB — RESP PANEL BY RT-PCR (FLU A&B, COVID) ARPGX2
Influenza A by PCR: NEGATIVE
Influenza B by PCR: NEGATIVE
SARS Coronavirus 2 by RT PCR: POSITIVE — AB

## 2020-10-23 LAB — URINALYSIS, ROUTINE W REFLEX MICROSCOPIC
Bilirubin Urine: NEGATIVE
Glucose, UA: NEGATIVE mg/dL
Ketones, ur: 5 mg/dL — AB
Leukocytes,Ua: NEGATIVE
Nitrite: NEGATIVE
Protein, ur: 30 mg/dL — AB
RBC / HPF: >50 RBC/hpf — ABNORMAL HIGH (ref 0–5)
Specific Gravity, Urine: 1.019 (ref 1.005–1.030)
pH: 5 (ref 5.0–8.0)

## 2020-10-23 LAB — PROCALCITONIN: Procalcitonin: 0.63 ng/mL

## 2020-10-23 LAB — BRAIN NATRIURETIC PEPTIDE: B Natriuretic Peptide: 541.6 pg/mL — ABNORMAL HIGH (ref 0.0–100.0)

## 2020-10-23 LAB — VITAMIN B12: Vitamin B-12: 429 pg/mL (ref 180–914)

## 2020-10-23 LAB — IRON AND TIBC
Iron: 10 ug/dL — ABNORMAL LOW (ref 45–182)
Saturation Ratios: 4 % — ABNORMAL LOW (ref 17.9–39.5)
TIBC: 273 ug/dL (ref 250–450)
UIBC: 263 ug/dL

## 2020-10-23 LAB — RETICULOCYTES
Immature Retic Fract: 9.3 % (ref 2.3–15.9)
RBC.: 2.88 MIL/uL — ABNORMAL LOW (ref 4.22–5.81)
Retic Count, Absolute: 20.2 10*3/uL (ref 19.0–186.0)
Retic Ct Pct: 0.7 % (ref 0.4–3.1)

## 2020-10-23 LAB — PROTIME-INR
INR: 1.2 (ref 0.8–1.2)
Prothrombin Time: 15 seconds (ref 11.4–15.2)

## 2020-10-23 LAB — APTT: aPTT: 29 seconds (ref 24–36)

## 2020-10-23 LAB — HEMOGLOBIN AND HEMATOCRIT, BLOOD
HCT: 28.9 % — ABNORMAL LOW (ref 39.0–52.0)
Hemoglobin: 9.2 g/dL — ABNORMAL LOW (ref 13.0–17.0)

## 2020-10-23 LAB — LACTIC ACID, PLASMA
Lactic Acid, Venous: 1.7 mmol/L (ref 0.5–1.9)
Lactic Acid, Venous: 2 mmol/L (ref 0.5–1.9)

## 2020-10-23 LAB — FIBRINOGEN: Fibrinogen: 390 mg/dL (ref 210–475)

## 2020-10-23 LAB — D-DIMER, QUANTITATIVE: D-Dimer, Quant: 2.06 ug/mL-FEU — ABNORMAL HIGH (ref 0.00–0.50)

## 2020-10-23 LAB — HEPATITIS B SURFACE ANTIGEN: Hepatitis B Surface Ag: NONREACTIVE

## 2020-10-23 LAB — FERRITIN: Ferritin: 71 ng/mL (ref 24–336)

## 2020-10-23 LAB — POC OCCULT BLOOD, ED: Fecal Occult Bld: POSITIVE — AB

## 2020-10-23 LAB — FOLATE: Folate: 32.5 ng/mL (ref >5.9)

## 2020-10-23 LAB — C-REACTIVE PROTEIN: CRP: 7.5 mg/dL — ABNORMAL HIGH (ref <1.0)

## 2020-10-23 LAB — MAGNESIUM: Magnesium: 1.6 mg/dL — ABNORMAL LOW (ref 1.7–2.4)

## 2020-10-23 MED ORDER — FENTANYL CITRATE (PF) 100 MCG/2ML IJ SOLN
INTRAMUSCULAR | Status: AC
  Filled 2020-10-23: qty 2

## 2020-10-23 MED ORDER — ADULT MULTIVITAMIN W/MINERALS CH
1.0000 | ORAL_TABLET | Freq: Every day | ORAL | Status: DC
  Administered 2020-10-24 – 2020-11-04 (×12): 1 via ORAL
  Filled 2020-10-23 (×13): qty 1

## 2020-10-23 MED ORDER — POTASSIUM CHLORIDE CRYS ER 20 MEQ PO TBCR
40.0000 meq | EXTENDED_RELEASE_TABLET | Freq: Once | ORAL | Status: AC
  Administered 2020-10-23: 40 meq via ORAL
  Filled 2020-10-23: qty 2

## 2020-10-23 MED ORDER — MIDAZOLAM HCL 2 MG/2ML IJ SOLN
INTRAMUSCULAR | Status: AC
  Filled 2020-10-23: qty 4

## 2020-10-23 MED ORDER — ALBUTEROL SULFATE (2.5 MG/3ML) 0.083% IN NEBU: 2.5000 mg | INHALATION_SOLUTION | RESPIRATORY_TRACT | Status: DC | PRN

## 2020-10-23 MED ORDER — ASCORBIC ACID 500 MG PO TABS
500.0000 mg | ORAL_TABLET | Freq: Every day | ORAL | Status: DC
  Administered 2020-10-23 – 2020-11-04 (×13): 500 mg via ORAL
  Filled 2020-10-23 (×13): qty 1

## 2020-10-23 MED ORDER — PANTOPRAZOLE SODIUM 40 MG IV SOLR
40.0000 mg | Freq: Two times a day (BID) | INTRAVENOUS | Status: DC
  Administered 2020-10-23 – 2020-10-27 (×9): 40 mg via INTRAVENOUS
  Filled 2020-10-23 (×9): qty 40

## 2020-10-23 MED ORDER — ROCURONIUM BROMIDE 10 MG/ML (PF) SYRINGE
PREFILLED_SYRINGE | INTRAVENOUS | Status: AC
  Filled 2020-10-23: qty 10

## 2020-10-23 MED ORDER — SODIUM CHLORIDE 0.9 % IV SOLN
1.0000 g | Freq: Once | INTRAVENOUS | Status: AC
  Administered 2020-10-23: 1 g via INTRAVENOUS
  Filled 2020-10-23: qty 10

## 2020-10-23 MED ORDER — MAGNESIUM SULFATE 2 GM/50ML IV SOLN
2.0000 g | Freq: Once | INTRAVENOUS | Status: AC
  Administered 2020-10-23: 2 g via INTRAVENOUS
  Filled 2020-10-23: qty 50

## 2020-10-23 MED ORDER — DIPHENOXYLATE-ATROPINE 2.5-0.025 MG PO TABS: 1.0000 | ORAL_TABLET | Freq: Four times a day (QID) | ORAL | Status: DC | PRN

## 2020-10-23 MED ORDER — SODIUM CHLORIDE 0.9 % IV BOLUS
1000.0000 mL | Freq: Once | INTRAVENOUS | Status: AC
  Administered 2020-10-23: 1000 mL via INTRAVENOUS

## 2020-10-23 MED ORDER — GUAIFENESIN-DM 100-10 MG/5ML PO SYRP: 10.0000 mL | ORAL_SOLUTION | ORAL | Status: DC | PRN

## 2020-10-23 MED ORDER — SODIUM CHLORIDE 0.9 % IV SOLN: INTRAVENOUS | Status: AC | PRN

## 2020-10-23 MED ORDER — ETOMIDATE 2 MG/ML IV SOLN
INTRAVENOUS | Status: AC
  Administered 2020-10-23: 8 mg
  Filled 2020-10-23: qty 20

## 2020-10-23 MED ORDER — PREDNISONE 5 MG PO TABS
50.0000 mg | ORAL_TABLET | Freq: Every day | ORAL | Status: DC
  Filled 2020-10-23: qty 2

## 2020-10-23 MED ORDER — ALBUTEROL SULFATE HFA 108 (90 BASE) MCG/ACT IN AERS
2.0000 | INHALATION_SPRAY | Freq: Four times a day (QID) | RESPIRATORY_TRACT | Status: DC
  Administered 2020-10-23 – 2020-10-31 (×23): 2 via RESPIRATORY_TRACT
  Filled 2020-10-23: qty 6.7

## 2020-10-23 MED ORDER — AMIODARONE HCL IN DEXTROSE 360-4.14 MG/200ML-% IV SOLN
60.0000 mg/h | INTRAVENOUS | Status: AC
  Administered 2020-10-23: 60 mg/h via INTRAVENOUS
  Filled 2020-10-23: qty 200

## 2020-10-23 MED ORDER — SODIUM CHLORIDE 0.9 % IV SOLN
500.0000 mg | Freq: Once | INTRAVENOUS | Status: AC
  Administered 2020-10-23: 500 mg via INTRAVENOUS
  Filled 2020-10-23: qty 500

## 2020-10-23 MED ORDER — METHYLPREDNISOLONE SODIUM SUCC 40 MG IJ SOLR
0.5000 mg/kg | Freq: Two times a day (BID) | INTRAMUSCULAR | Status: AC
  Administered 2020-10-23 – 2020-10-26 (×6): 31.2 mg via INTRAVENOUS
  Filled 2020-10-23 (×6): qty 1

## 2020-10-23 MED ORDER — VITAMIN B-12 1000 MCG PO TABS
1000.0000 ug | ORAL_TABLET | Freq: Every day | ORAL | Status: DC
  Administered 2020-10-24 – 2020-11-04 (×12): 1000 ug via ORAL
  Filled 2020-10-23 (×13): qty 1

## 2020-10-23 MED ORDER — SODIUM CHLORIDE 0.9 % IV SOLN
100.0000 mg | Freq: Every day | INTRAVENOUS | Status: DC
  Administered 2020-10-24 – 2020-10-26 (×3): 100 mg via INTRAVENOUS
  Filled 2020-10-23 (×4): qty 20

## 2020-10-23 MED ORDER — ACETAMINOPHEN 325 MG PO TABS
650.0000 mg | ORAL_TABLET | Freq: Once | ORAL | Status: AC
  Administered 2020-10-23: 650 mg via ORAL
  Filled 2020-10-23: qty 2

## 2020-10-23 MED ORDER — SODIUM CHLORIDE 0.9 % IV SOLN
200.0000 mg | Freq: Once | INTRAVENOUS | Status: AC
  Administered 2020-10-23: 200 mg via INTRAVENOUS
  Filled 2020-10-23: qty 40

## 2020-10-23 MED ORDER — AMIODARONE HCL IN DEXTROSE 360-4.14 MG/200ML-% IV SOLN
30.0000 mg/h | INTRAVENOUS | Status: DC
  Administered 2020-10-23 – 2020-10-27 (×7): 30 mg/h via INTRAVENOUS
  Filled 2020-10-23 (×10): qty 200

## 2020-10-23 MED ORDER — SODIUM CHLORIDE 0.9% FLUSH
3.0000 mL | Freq: Two times a day (BID) | INTRAVENOUS | Status: DC
  Administered 2020-10-23 – 2020-11-04 (×25): 3 mL via INTRAVENOUS

## 2020-10-23 MED ORDER — SODIUM CHLORIDE 0.9 % IV BOLUS
500.0000 mL | Freq: Once | INTRAVENOUS | Status: AC
  Administered 2020-10-23: 500 mL via INTRAVENOUS

## 2020-10-23 MED ORDER — ETOMIDATE 2 MG/ML IV SOLN
INTRAVENOUS | Status: AC | PRN
  Administered 2020-10-23: 8 mg via INTRAVENOUS

## 2020-10-23 MED ORDER — ALBUTEROL SULFATE HFA 108 (90 BASE) MCG/ACT IN AERS
2.0000 | INHALATION_SPRAY | RESPIRATORY_TRACT | Status: DC | PRN
  Filled 2020-10-23: qty 6.7

## 2020-10-23 MED ORDER — SODIUM CHLORIDE 0.9 % IV BOLUS
250.0000 mL | Freq: Once | INTRAVENOUS | Status: AC
  Administered 2020-10-23: 250 mL via INTRAVENOUS

## 2020-10-23 MED ORDER — VITAMIN D 25 MCG (1000 UNIT) PO TABS
1000.0000 [IU] | ORAL_TABLET | Freq: Every day | ORAL | Status: DC
  Administered 2020-10-23 – 2020-11-04 (×13): 1000 [IU] via ORAL
  Filled 2020-10-23 (×14): qty 1

## 2020-10-23 MED ORDER — HYDROCOD POLST-CPM POLST ER 10-8 MG/5ML PO SUER
5.0000 mL | Freq: Two times a day (BID) | ORAL | Status: DC | PRN
  Administered 2020-10-26: 5 mL via ORAL
  Filled 2020-10-23: qty 5

## 2020-10-23 MED ORDER — ACETAMINOPHEN 325 MG PO TABS
650.0000 mg | ORAL_TABLET | Freq: Four times a day (QID) | ORAL | Status: DC | PRN
  Administered 2020-10-28: 650 mg via ORAL
  Filled 2020-10-23: qty 2

## 2020-10-23 MED ORDER — ZINC SULFATE 220 (50 ZN) MG PO CAPS
220.0000 mg | ORAL_CAPSULE | Freq: Every day | ORAL | Status: DC
  Administered 2020-10-23 – 2020-11-04 (×13): 220 mg via ORAL
  Filled 2020-10-23 (×13): qty 1

## 2020-10-23 NOTE — ED Provider Notes (Signed)
.Sedation  Date/Time: 10/23/2020 2:46 PM Performed by: Deno Etienne, DO Authorized by: Deno Etienne, DO   Consent:    Consent obtained:  Verbal   Consent given by:  Patient   Risks discussed:  Allergic reaction and inadequate sedation   Alternatives discussed:  Analgesia without sedation and anxiolysis Universal protocol:    Immediately prior to procedure a time out was called: yes     Patient identity confirmation method:  Arm band Indications:    Procedure performed:  Cardioversion Pre-sedation assessment:    Time since last food or drink:  4 hours   NPO status caution: urgency dictates proceeding with non-ideal NPO status     ASA classification: class 3 - patient with severe systemic disease     Neck mobility: reduced     Mouth opening:  2 finger widths   Thyromental distance:  2 finger widths   Mallampati score:  III - soft palate, base of uvula visible   Pre-sedation assessments completed and reviewed: airway patency, cardiovascular function, hydration status, mental status, nausea/vomiting, pain level, respiratory function and temperature   Immediate pre-procedure details:    Reassessment: Patient reassessed immediately prior to procedure     Reviewed: vital signs     Verified: bag valve mask available, emergency equipment available, intubation equipment available, IV patency confirmed, oxygen available and suction available   Procedure details (see MAR for exact dosages):    Preoxygenation:  Nasal cannula   Sedation:  Etomidate   Intended level of sedation: moderate (conscious sedation)   Intra-procedure monitoring:  Blood pressure monitoring, cardiac monitor, continuous capnometry, continuous pulse oximetry, frequent LOC assessments and frequent vital sign checks   Intra-procedure events: none     Total Provider sedation time (minutes):  25 Post-procedure details:    Attendance: Constant attendance by certified staff until patient recovered     Recovery: Patient returned to  pre-procedure baseline     Post-sedation assessments completed and reviewed: airway patency, cardiovascular function, hydration status, mental status, nausea/vomiting, pain level, respiratory function and temperature     Patient is stable for discharge or admission: yes     Patient tolerance:  Tolerated well, no immediate complications .Cardioversion  Date/Time: 10/23/2020 2:47 PM Performed by: Deno Etienne, DO Authorized by: Deno Etienne, DO   Consent:    Consent obtained:  Emergent situation   Consent given by:  Patient   Risks discussed:  Cutaneous burn, death, induced arrhythmia and pain   Alternatives discussed:  No treatment Universal protocol:    Immediately prior to procedure a time out was called: yes     Patient identity confirmed:  Verbally with patient Pre-procedure details:    Cardioversion basis:  Emergent   Rhythm:  Ventricular tachycardia   Electrode placement:  Anterior-posterior Patient sedated: Yes. Refer to sedation procedure documentation for details of sedation.  Attempt one:    Cardioversion mode:  Synchronous   Waveform:  Biphasic   Shock (Joules):  200   Shock outcome:  No change in rhythm Attempt two:    Cardioversion mode:  Synchronous   Waveform:  Biphasic   Shock (Joules):  200   Shock outcome:  Conversion to other rhythm Post-procedure details:    Patient status:  Awake   Patient tolerance of procedure:  Tolerated well, no immediate complications   Medical screening examination/treatment/procedure(s) were conducted as a shared visit with non-physician practitioner(s) and myself.  I personally evaluated the patient during the encounter.  EKG Interpretation  Date/Time:  Friday October 23 2020 12:46:27 EST Ventricular Rate:  189 PR Interval:    QRS Duration: 152 QT Interval:  281 QTC Calculation: 499 R Axis:   -63 Text Interpretation: Extreme tachycardia with wide complex, no further rhythm analysis attempted vtach vs afib with aberrancy  Confirmed by Deno Etienne [phone]) on 10/23/2020 1:44:24 PM    See the written copy of this report in the patient's paper medical record.  These results did not interface directly into the electronic medical record and are summarized here.  86 year old male with a chief complaints of bloody stools and fatigue.  Found to be febrile to 102 here.  Patient initially in a rhythm was consistent with atrial fibrillation with RVR.  Converted into a rhythm that was at a rate as high as 200 bpm and was very wide and regular.  Blood pressure had dwindled, in the 60s.  Patient still alert.  Sedated and cardioverted.  Found to have Covid.  Admit.    Deno Etienne, DO 10/23/20 1449

## 2020-10-23 NOTE — Progress Notes (Signed)
Patient admitted from ED.  Orders acknowledged, care initiated per orders.  Pt is oriented to only self - pt is unable to answer admission questionnaires.

## 2020-10-23 NOTE — ED Notes (Signed)
Pt noted to have heart rhythm of Vtach w/ pulse on central monitor. Dr. Tyrone Nine and Merrily Pew PA notified. Crash cart brought to room, patient placed on defib pads.

## 2020-10-23 NOTE — Consult Note (Signed)
Urology Consult  Referring physician: Dr. Tamala Julian Reason for referral: difficult foley placement  Chief Complaint: abdominal pain  History of Present Illness: [name] [name] is a 86yo with a history of BPH who was admitted with a GI bleed and COVID19. SInce admission he has been having difficulty urinating and multiple attempts were made by the nursing staff to place a foley which was unsuccessful. The patient has moderate LUTS at baseline prior to admission. He has a fair stream, urinary hesitancy, nocturia 3-5 and a feeling of incomplete emptying. NO hx of UTI or hematuria  Past Medical History:  Diagnosis Date  . CAD (coronary artery disease)    Old inferolateral MI s/p CABG in 2002  . CAP (community acquired pneumonia) 10/03/2011  . Cardiomyopathy    s/p ICD 09/30/08; EF 35 to 40% per echo in October 2021  . Chronic systolic CHF (congestive heart failure) ([facility]) 07/24/2013  . Colon cancer ([facility])    s/p chemo, RT, sugery  . Colorectal anastomotic stricture   . Diverticulosis   . ED (erectile dysfunction)   . GERD (gastroesophageal reflux disease)    rarely  . HTN (hypertension)   . ICD (implantable cardiac defibrillator) discharge October 2012   removed 2016    now only PPM  . Iron deficiency anemia   . Paroxysmal atrial fibrillation (H[facility])    controlled with amiodarone, not a coumadin candidate due to GI bleeding   Past Surgical History:  Procedure Laterality Date  . APPENDECTOMY    . BACK SURGERY    . BIOPSY  09/22/2018   Procedure: BIOPSY;  Surgeon: Gatha Mayer, MD;  Location: [facility];  Service: Endoscopy;;  . CARDIAC CATHETERIZATION  11/07/2000   EF 32%  . CARDIAC DEFIBRILLATOR PLACEMENT  09/30/08   MDT dual chamber ICD placed by Dr Rayann Heman  . CARDIOVASCULAR STRESS TEST  08/06/2008   EF 30%  . CHOLECYSTECTOMY    . COLON RESECTION     x2  . COLONOSCOPY     multiple  . COLONOSCOPY N/A 09/22/2018   Procedure: COLONOSCOPY;  Surgeon: Gatha Mayer, MD;  Location: [facility];  Service: Endoscopy;  Laterality: N/A;  . CORONARY ARTERY BYPASS GRAFT    . EP IMPLANTABLE DEVICE N/A 10/15/2015   Procedure:  PPM Generator Changeout;  Surgeon: [name], MD;  Location: [facility];  Service: Cardiovascular;  Laterality: N/A;  . HERNIA REPAIR    . US ECHOCARDIOGRAPHY  08/11/2008   EF 30-35%  . VASECTOMY      Medications: I have reviewed the patient's current medications. Allergies:  Allergies  Allergen Reactions  . Iron     Stomach issues  . Lescol [Fluvastatin Sodium]     Dizziness  . Pentazocine Lactate     unknown  . Vytorin [Ezetimibe-Simvastatin]     Leg cramps   . Aspirin     Bleeding     Family History  Problem Relation Age of Onset  . Colon cancer Father   . Diabetes Sister   . Colon polyps Brother   . Pancreatic cancer Brother 36   Social History:  reports that he has quit smoking. His smoking use included cigars. He has never used smokeless tobacco. He reports that he does not drink alcohol and does not use drugs.  Review of Systems  Constitutional: Positive for fever.  Respiratory: Positive for cough and shortness of breath.   Genitourinary: Positive for difficulty urinating.  All other systems reviewed and are negative.  Physical Exam:  Vital signs in last 24 hours: Temp:  [98.5 F (36.9 C)-102.2 F (39 C)] 98.5 F (36.9 C) (11/26 1626) Pulse Rate:  [37-131] 100 (11/26 1626) Resp:  [12-30] 18 (11/26 1626) BP: (62-144)/(38-105) 144/77 (11/26 1626) SpO2:  [60 %-100 %] 97 % (11/26 1626) Weight:  [62 kg] 62 kg (11/26 1115) Physical Exam Vitals reviewed.  Constitutional:      Appearance: Normal appearance.  HENT:     Head: Normocephalic and atraumatic.     Nose: Nose normal.     Mouth/Throat:     Mouth: Mucous membranes are dry.  Eyes:     Extraocular Movements: Extraocular movements intact.     Pupils: Pupils are equal, round, and reactive to light.  Cardiovascular:     Rate and Rhythm: Tachycardia  present.  Pulmonary:     Effort: Pulmonary effort is normal. No respiratory distress.  Abdominal:     General: Abdomen is flat. There is distension.  Genitourinary:    Penis: Normal and uncircumcised.      Testes: Normal.        Right: Mass or tenderness not present.        Left: Mass or tenderness not present.     Epididymis:     Right: Normal.     Left: Normal.  Musculoskeletal:        General: Normal range of motion.     Cervical back: Normal range of motion and neck supple.  Skin:    General: Skin is warm and dry.  Neurological:     General: No focal deficit present.     Mental Status: He is alert and oriented to person, place, and time. Mental status is at baseline.  Psychiatric:        Mood and Affect: Mood normal.        Behavior: Behavior normal.        Thought Content: Thought content normal.        Judgment: Judgment normal.     Laboratory Data:  Results for orders placed or performed during the hospital encounter of 10/23/20 (from the past 72 hour(s))  Lactic acid, plasma     Status: None   Collection Time: 10/23/20 11:29 AM  Result Value Ref Range   Lactic Acid, Venous 1.7 0.5 - 1.9 mmol/L    Comment: Performed at [facility], [address], [place], [place] [address]  Comprehensive metabolic panel     Status: Abnormal   Collection Time: 10/23/20 11:29 AM  Result Value Ref Range   Sodium 141 135 - 145 mmol/L   Potassium 3.7 3.5 - 5.1 mmol/L   Chloride 105 98 - 111 mmol/L   CO2 26 22 - 32 mmol/L   Glucose, Bld 118 (H) 70 - 99 mg/dL    Comment: Glucose reference range applies only to samples taken after fasting for at least 8 hours.   BUN 15 8 - 23 mg/dL   Creatinine, Ser 1.12 0.61 - 1.24 mg/dL   Calcium 9.1 8.9 - 10.3 mg/dL   Total Protein 5.9 (L) 6.5 - 8.1 g/dL   Albumin 3.3 (L) 3.5 - 5.0 g/dL   AST 37 15 - 41 U/L   ALT 22 0 - 44 U/L   Alkaline Phosphatase 31 (L) 38 - 126 U/L   Total Bilirubin 1.2 0.3 - 1.2 mg/dL   GFR, Estimated >60 >60  mL/min    Comment: (NOTE) Calculated using the CKD-EPI Creatinine Equation (2021)  Anion gap 10 5 - 15    Comment: Performed at [facility] [address], [place], [place] [address]  CBC WITH DIFFERENTIAL     Status: Abnormal   Collection Time: 10/23/20 11:29 AM  Result Value Ref Range   WBC 9.4 4.0 - 10.5 K/uL   RBC 2.92 (L) 4.22 - 5.81 MIL/uL   Hemoglobin 9.8 (L) 13.0 - 17.0 g/dL   HCT 29.3 (L) 39 - 52 %   MCV 100.3 (H) 80.0 - 100.0 fL   MCH 33.6 26.0 - 34.0 pg   MCHC 33.4 30.0 - 36.0 g/dL   RDW 14.3 11.5 - 15.5 %   Platelets 136 (L) 150 - 400 K/uL   nRBC 0.0 0.0 - 0.2 %   Neutrophils Relative % 91 %   Neutro Abs 8.5 (H) 1.7 - 7.7 K/uL   Lymphocytes Relative 3 %   Lymphs Abs 0.3 (L) 0.7 - 4.0 K/uL   Monocytes Relative 6 %   Monocytes Absolute 0.6 0.1 - 1.0 K/uL   Eosinophils Relative 0 %   Eosinophils Absolute 0.0 0.0 - 0.5 K/uL   Basophils Relative 0 %   Basophils Absolute 0.0 0.0 - 0.1 K/uL   Immature Granulocytes 0 %   Abs Immature Granulocytes 0.04 0.00 - 0.07 K/uL    Comment: Performed at [facility], [address], [place], [place] [address]  Protime-INR     Status: None   Collection Time: 10/23/20 11:29 AM  Result Value Ref Range   Prothrombin Time 15.0 11.4 - 15.2 seconds   INR 1.2 0.8 - 1.2    Comment: (NOTE) INR goal varies based on device and disease states. Performed at [facility], [place] [address], [place], [place] [address]   APTT     Status: None   Collection Time: 10/23/20 11:29 AM  Result Value Ref Range   aPTT 29 24 - 36 seconds    Comment: Performed at [facility] [address], [place], [place] [address]  Blood culture (routine x 2)     Status: None (Preliminary result)   Collection Time: 10/23/20 11:29 AM   Specimen: BLOOD  Result Value Ref Range   Specimen Description BLOOD RIGHT ANTECUBITAL    Special Requests      BOTTLES DRAWN AEROBIC AND ANAEROBIC Blood Culture adequate volume   Culture      NO GROWTH  < 12 HOURS Performed at [facility], [place] [address] [place], [place] [address]    Report Status PENDING   Blood culture (routine x 2)     Status: None (Preliminary result)   Collection Time: 10/23/20 11:30 AM   Specimen: BLOOD  Result Value Ref Range   Specimen Description BLOOD LEFT ANTECUBITAL    Special Requests      BOTTLES DRAWN AEROBIC AND ANAEROBIC Blood Culture adequate volume   Culture      NO GROWTH < 12 HOURS Performed at [facility], [place] [address] [place], [place] [address]    Report Status PENDING   Type and screen [place]     Status: None   Collection Time: 10/23/20 11:30 AM  Result Value Ref Range   ABO/RH(D) O POS    Antibody Screen NEG    Sample Expiration      10/26/2020,2359 Performed at [facility], [place] [address] [place],  [address]   POC occult blood, ED  Status: Abnormal   Collection Time: 10/23/20 12:00 PM  Result Value Ref Range   Fecal Occult Bld POSITIVE (A) NEGATIVE  Resp Panel by RT-PCR (Flu A&B, Covid) Nasopharyngeal Swab     Status: Abnormal   Collection Time: 10/23/20 12:02 PM   Specimen: Nasopharyngeal Swab; Nasopharyngeal(NP) swabs in vial transport medium  Result Value Ref Range   SARS Coronavirus 2 by RT PCR POSITIVE (A) NEGATIVE    Comment: emailed L. Berdik RN 14:25 10/23/20 ([email or web address]) (NOTE) SARS-CoV-2 target nucleic acids are DETECTED.  The SARS-CoV-2 RNA is generally detectable in upper respiratory specimens during the acute phase of infection. Positive results are indicative of the presence of the identified virus, but do not rule out bacterial infection or co-infection with other pathogens not detected by the test. Clinical correlation with patient history and other diagnostic information is necessary to determine patient infection status. The expected result is Negative.  Fact Sheet for Patients: EntrepreneurPulse.com.au  Fact Sheet for Healthcare  Providers: IncredibleEmployment.be  This test is not yet approved or cleared by the [place] FDA and  has been authorized for detection and/or diagnosis of SARS-CoV-2 by FDA under an Emergency Use Authorization (EUA).  This EUA will remain in effect (meaning this test can be used) for the duration of  the COVID- 19 declaration under Section 564(b)(1) of the Act, 21 U.S.C. section 360bbb-3(b)(1), unless the authorization is terminated or revoked sooner.     Influenza A by PCR NEGATIVE NEGATIVE   Influenza B by PCR NEGATIVE NEGATIVE    Comment: (NOTE) The Xpert Xpress SARS-CoV-2/FLU/RSV plus assay is intended as an aid in the diagnosis of influenza from Nasopharyngeal swab specimens and should not be used as a sole basis for treatment. Nasal washings and aspirates are unacceptable for Xpert Xpress SARS-CoV-2/FLU/RSV testing.  Fact Sheet for Patients: EntrepreneurPulse.com.au  Fact Sheet for Healthcare Providers: IncredibleEmployment.be  This test is not yet approved or cleared by the [place] FDA and has been authorized for detection and/or diagnosis of SARS-CoV-2 by FDA under an Emergency Use Authorization (EUA). This EUA will remain in effect (meaning this test can be used) for the duration of the COVID-19 declaration under Section 564(b)(1) of the Act, 21 U.S.C. section 360bbb-3(b)(1), unless the authorization is terminated or revoked.  Performed at [facility], [place] [address] [place], [place] [address]   Magnesium     Status: Abnormal   Collection Time: 10/23/20  1:07 PM  Result Value Ref Range   Magnesium 1.6 (L) 1.7 - 2.4 mg/dL    Comment: Performed at [facility] [address], [place], [place] [address]  Troponin I (High Sensitivity)     Status: Abnormal   Collection Time: 10/23/20  1:07 PM  Result Value Ref Range   Troponin I (High Sensitivity) 63 (H) <18 ng/L    Comment:  (NOTE) Elevated high sensitivity troponin I (hsTnI) values and significant  changes across serial measurements may suggest ACS but many other  chronic and acute conditions are known to elevate hsTnI results.  Refer to the "Links" section for chest pain algorithms and additional  guidance. Performed at [facility], [place] [address], [place], [place] [address]   Lactic acid, plasma     Status: Abnormal   Collection Time: 10/23/20  1:19 PM  Result Value Ref Range   Lactic Acid, Venous 2.0 (HH) 0.5 - 1.9 mmol/L    Comment: CRITICAL RESULT CALLED TO, READ BACK BY AND VERIFIED WITH: EMILEE SHAFER,  RN AT 1409 10/23/2020 BY ZBEECH. Performed at [facility], [place] [address] [place], [place] [address]   Troponin I (High Sensitivity)     Status: Abnormal   Collection Time: 10/23/20  4:40 PM  Result Value Ref Range   Troponin I (High Sensitivity) 99 (H) <18 ng/L    Comment: RESULT CALLED TO, READ BACK BY AND VERIFIED WITH: R.JALLOW,RN @1804  10/23/2020 [email or web address] (NOTE) Elevated high sensitivity troponin I (hsTnI) values and significant  changes across serial measurements may suggest ACS but many other  chronic and acute conditions are known to elevate hsTnI results.  Refer to the Links section for chest pain algorithms and additional  guidance. Performed at [facility], [place] [address] [place], [place] [address]   Brain natriuretic peptide     Status: Abnormal   Collection Time: 10/23/20  4:40 PM  Result Value Ref Range   B Natriuretic Peptide 541.6 (H) 0.0 - 100.0 pg/mL    Comment: Performed at [facility] [address] [place], [place] [address]  C-reactive protein     Status: Abnormal   Collection Time: 10/23/20  4:40 PM  Result Value Ref Range   CRP 7.5 (H) <1.0 mg/dL    Comment: Performed at [facility] [address], [place], [place] [address]  D-dimer, quantitative (not at [facility])     Status: Abnormal   Collection Time: 10/23/20  4:40 PM  Result  Value Ref Range   D-Dimer, Quant 2.06 (H) 0.00 - 0.50 ug/mL-FEU    Comment: (NOTE) At the manufacturer cut-off value of 0.5 g/mL FEU, this assay has a negative predictive value of 95-100%.This assay is intended for use in conjunction with a clinical pretest probability (PTP) assessment model to exclude pulmonary embolism (PE) and deep venous thrombosis (DVT) in outpatients suspected of PE or DVT. Results should be correlated with clinical presentation. Performed at [facility], [place] [address], [place], [place] [address]   Ferritin     Status: None   Collection Time: 10/23/20  4:40 PM  Result Value Ref Range   Ferritin 71 24 - 336 ng/mL    Comment: Performed at [facility] [address] [place], [place] [address]  Fibrinogen     Status: None   Collection Time: 10/23/20  4:40 PM  Result Value Ref Range   Fibrinogen 390 210 - 475 mg/dL    Comment: Performed at [facility] [address] [place], [place] [address]  Procalcitonin     Status: None   Collection Time: 10/23/20  4:40 PM  Result Value Ref Range   Procalcitonin 0.63 ng/mL    Comment:        Interpretation: PCT > 0.5 ng/mL and <= 2 ng/mL: Systemic infection (sepsis) is possible, but other conditions are known to elevate PCT as well. (NOTE)       Sepsis PCT Algorithm           Lower Respiratory Tract                                      Infection PCT Algorithm    ----------------------------     ----------------------------         PCT < 0.25 ng/mL                PCT < 0.10 ng/mL  Strongly encourage             Strongly discourage   discontinuation of antibiotics    initiation of antibiotics    ----------------------------     -----------------------------       PCT 0.25 - 0.50 ng/mL            PCT 0.10 - 0.25 ng/mL               OR       >80% decrease in PCT            Discourage initiation of                                            antibiotics      Encourage discontinuation            of antibiotics    ----------------------------     -----------------------------         PCT >= 0.50 ng/mL              PCT 0.26 - 0.50 ng/mL                AND       <80% decrease in PCT             Encourage initiation of                                             antibiotics       Encourage continuation           of antibiotics    ----------------------------     -----------------------------        PCT >= 0.50 ng/mL                  PCT > 0.50 ng/mL               AND         increase in PCT                  Strongly encourage                                      initiation of antibiotics    Strongly encourage escalation           of antibiotics                                     -----------------------------                                           PCT <= 0.25 ng/mL                                                 OR                                        >  80% decrease in PCT                                      Discontinue / Do not initiate                                             antibiotics  Performed at [facility], [place] [address] [place], [place] [address]   Lactate dehydrogenase     Status: Abnormal   Collection Time: 10/23/20  4:40 PM  Result Value Ref Range   LDH 201 (H) 98 - 192 U/L    Comment: Performed at [facility] [address] [place], [place] [address]  Hepatitis B surface antigen     Status: None   Collection Time: 10/23/20  4:40 PM  Result Value Ref Range   Hepatitis B Surface Ag NON REACTIVE NON REACTIVE    Comment: Performed at [facility] [address] [place], [place] [address]  Hemoglobin and hematocrit, blood     Status: Abnormal   Collection Time: 10/23/20  4:40 PM  Result Value Ref Range   Hemoglobin 9.2 (L) 13.0 - 17.0 g/dL   HCT 28.9 (L) 39 - 52 %    Comment: Performed at [facility] [address], [place], [place] [address]  Vitamin B12     Status: None   Collection Time: 10/23/20  4:40 PM   Result Value Ref Range   Vitamin B-12 429 180 - 914 pg/mL    Comment: (NOTE) This assay is not validated for testing neonatal or myeloproliferative syndrome specimens for Vitamin B12 levels. Performed at [facility], [place] [address], [place], [place] [address]   Folate     Status: None   Collection Time: 10/23/20  4:40 PM  Result Value Ref Range   Folate 32.5 >5.9 ng/mL    Comment: Performed at [facility] [address], [place], [place] [address]  Iron and TIBC     Status: Abnormal   Collection Time: 10/23/20  4:40 PM  Result Value Ref Range   Iron 10 (L) 45 - 182 ug/dL   TIBC 273 250 - 450 ug/dL   Saturation Ratios 4 (L) 17.9 - 39.5 %   UIBC 263 ug/dL    Comment: Performed at [facility], [place] [address], [place], [place] [address]  Reticulocytes     Status: Abnormal   Collection Time: 10/23/20  4:40 PM  Result Value Ref Range   Retic Ct Pct 0.7 0.4 - 3.1 %   RBC. 2.88 (L) 4.22 - 5.81 MIL/uL   Retic Count, Absolute 20.2 19.0 - 186.0 K/uL   Immature Retic Fract 9.3 2.3 - 15.9 %    Comment: Performed at [facility] [address] [place],  [address]   Recent Results (from the past 240 hour(s))  Blood culture (routine x 2)     Status: None (Preliminary result)   Collection Time: 10/23/20 11:29 AM   Specimen: BLOOD  Result Value Ref Range Status   Specimen Description BLOOD RIGHT ANTECUBITAL  Final   Special Requests   Final    BOTTLES DRAWN AEROBIC AND ANAEROBIC Blood Culture adequate volume   Culture   Final  NO GROWTH < 12 HOURS Performed at [facility] [address], [place], [place] [address]    Report Status PENDING  Incomplete  Blood culture (routine x 2)     Status: None (Preliminary result)   Collection Time: 10/23/20 11:30 AM   Specimen: BLOOD  Result Value Ref Range Status   Specimen Description BLOOD LEFT ANTECUBITAL  Final   Special Requests   Final    BOTTLES DRAWN AEROBIC AND ANAEROBIC Blood Culture  adequate volume   Culture   Final    NO GROWTH < 12 HOURS Performed at [facility], [place] [address], [place], [place] [address]    Report Status PENDING  Incomplete  Resp Panel by RT-PCR (Flu A&B, Covid) Nasopharyngeal Swab     Status: Abnormal   Collection Time: 10/23/20 12:02 PM   Specimen: Nasopharyngeal Swab; Nasopharyngeal(NP) swabs in vial transport medium  Result Value Ref Range Status   SARS Coronavirus 2 by RT PCR POSITIVE (A) NEGATIVE Final    Comment: emailed L. Berdik RN 14:25 10/23/20 ([email or web address]) (NOTE) SARS-CoV-2 target nucleic acids are DETECTED.  The SARS-CoV-2 RNA is generally detectable in upper respiratory specimens during the acute phase of infection. Positive results are indicative of the presence of the identified virus, but do not rule out bacterial infection or co-infection with other pathogens not detected by the test. Clinical correlation with patient history and other diagnostic information is necessary to determine patient infection status. The expected result is Negative.  Fact Sheet for Patients: EntrepreneurPulse.com.au  Fact Sheet for Healthcare Providers: IncredibleEmployment.be  This test is not yet approved or cleared by the [place] FDA and  has been authorized for detection and/or diagnosis of SARS-CoV-2 by FDA under an Emergency Use Authorization (EUA).  This EUA will remain in effect (meaning this test can be used) for the duration of  the COVID- 19 declaration under Section 564(b)(1) of the Act, 21 U.S.C. section 360bbb-3(b)(1), unless the authorization is terminated or revoked sooner.     Influenza A by PCR NEGATIVE NEGATIVE Final   Influenza B by PCR NEGATIVE NEGATIVE Final    Comment: (NOTE) The Xpert Xpress SARS-CoV-2/FLU/RSV plus assay is intended as an aid in the diagnosis of influenza from Nasopharyngeal swab specimens and should not be used as a sole basis for treatment. Nasal  washings and aspirates are unacceptable for Xpert Xpress SARS-CoV-2/FLU/RSV testing.  Fact Sheet for Patients: EntrepreneurPulse.com.au  Fact Sheet for Healthcare Providers: IncredibleEmployment.be  This test is not yet approved or cleared by the [place] FDA and has been authorized for detection and/or diagnosis of SARS-CoV-2 by FDA under an Emergency Use Authorization (EUA). This EUA will remain in effect (meaning this test can be used) for the duration of the COVID-19 declaration under Section 564(b)(1) of the Act, 21 U.S.C. section 360bbb-3(b)(1), unless the authorization is terminated or revoked.  Performed at [facility], [place] [address], [place],  [address]    Creatinine: Recent Labs    10/23/20 1129  CREATININE 1.12   Baseline Creatinine: 1.1  Impression/Assessment:  86yo with BPH with Urinary retention  Plan:  1. A 18 french coude was placed without incident and 1100cc of dark urine drained. The patient should be started on flomax 0.4mg  daily and the foley should remain in place for 1 week prior to attempting a voiding trial.  Nicolette Bang 10/23/2020, 7:18 PM

## 2020-10-23 NOTE — ED Provider Notes (Signed)
[facility] EMERGENCY DEPARTMENT Provider Note   CSN: [id number] Arrival date & time: 10/23/20  1101     History Chief Complaint  Patient presents with  . Weakness    [name] [name] is a 86 y.o. male.  Patient with history of paroxysmal atrial fibrillation, chronic systolic heart failure, s/p pacemaker placement, colon cancer status post surgery and chemotherapy, history of GI bleed last colonoscopy 2019 ([place]) -- presents today for generalized weakness and GI bleeding.  Per EMS, family noted increased weakness over the past day and blood in the stool over the past 2 days.  Patient was found slumped over in bed at home.  He has been unable to ambulate.  No reported fevers, nausea, vomiting, diarrhea.  No history of anticoagulation due to GI bleed history.  Onset of symptoms acute.  Course is worsening.  Blood pressure reportedly 100/70 with EMS, given fluid bolus.        Past Medical History:  Diagnosis Date  . CAD (coronary artery disease)    Old inferolateral MI s/p CABG in 2002  . CAP (community acquired pneumonia) 10/03/2011  . Cardiomyopathy    s/p ICD 09/30/08; EF 35 to 40% per echo in October 2021  . Chronic systolic CHF (congestive heart failure) ([place]) 07/24/2013  . Colon cancer ([place])    s/p chemo, RT, sugery  . Colorectal anastomotic stricture   . Diverticulosis   . ED (erectile dysfunction)   . GERD (gastroesophageal reflux disease)    rarely  . HTN (hypertension)   . ICD (implantable cardiac defibrillator) discharge October 2012   ICD shock for afib with RVR  . Iron deficiency anemia   . Paroxysmal atrial fibrillation ([facility])    controlled with amiodarone, not a coumadin candidate due to GI bleeding    Patient Active Problem List   Diagnosis Date Noted  . GI bleed 09/23/2018  . Ulceration of intestine   . Lower GI bleed 07/01/2018  . ICD in place- MDT 2009 07/03/2014  . History of GI bleed-  07/03/2014  . Essential hypertension  07/03/2014  . Acute on chronic systolic (congestive) heart failure ([place]) 10/15/2013  . Dizziness 10/14/2013  . Chronic systolic CHF (congestive heart failure) ([place]) 07/24/2013  . Ischemic cardiomyopathy 10/14/2011  . Automatic implantable cardioverter/defibrillator (AICD) activation 10/02/2011  . A-fib ([place]) 03/21/2011  . CAD (coronary artery disease) 03/21/2011    Past Surgical History:  Procedure Laterality Date  . APPENDECTOMY    . BACK SURGERY    . BIOPSY  09/22/2018   Procedure: BIOPSY;  Surgeon: Gatha Mayer, MD;  Location: [facility];  Service: Endoscopy;;  . CARDIAC CATHETERIZATION  11/07/2000   EF 32%  . CARDIAC DEFIBRILLATOR PLACEMENT  09/30/08   MDT dual chamber ICD placed by Dr Rayann Heman  . CARDIOVASCULAR STRESS TEST  08/06/2008   EF 30%  . CHOLECYSTECTOMY    . COLON RESECTION     x2  . COLONOSCOPY     multiple  . COLONOSCOPY N/A 09/22/2018   Procedure: COLONOSCOPY;  Surgeon: Gatha Mayer, MD;  Location: [facility];  Service: Endoscopy;  Laterality: N/A;  . CORONARY ARTERY BYPASS GRAFT    . EP IMPLANTABLE DEVICE N/A 10/15/2015   Procedure:  PPM Generator Changeout;  Surgeon: [name], MD;  Location: [facility];  Service: Cardiovascular;  Laterality: N/A;  . HERNIA REPAIR    . US ECHOCARDIOGRAPHY  08/11/2008   EF 30-35%  . VASECTOMY  Family History  Problem Relation Age of Onset  . Colon cancer Father   . Diabetes Sister   . Colon polyps Brother   . Pancreatic cancer Brother 45    Social History   Tobacco Use  . Smoking status: Former Smoker    Types: Cigars  . Smokeless tobacco: Never Used  Vaping Use  . Vaping Use: Never used  Substance Use Topics  . Alcohol use: No  . Drug use: No    Home Medications Prior to Admission medications   Medication Sig Start Date End Date Taking? Authorizing Provider  BESIVANCE 0.6 % SUSP Place 1 drop into the right eye 3 (three) times daily. 08/23/18   [provider]    cholecalciferol (VITAMIN D) 1000 UNITS tablet Take 1,000 Units by mouth daily.     [provider]  digoxin (LANOXIN) 0.125 MG tablet Take 0.5 tablets (62.5 mcg total) by mouth daily. 01/08/20   Almyra Deforest, PA  diphenoxylate-atropine (LOMOTIL) 2.5-0.025 MG tablet  12/27/19   [provider]  DUREZOL 0.05 % EMUL Place 1 drop into the right eye 3 (three) times daily. 08/23/18   [provider]  furosemide (LASIX) 40 MG tablet TAKE 1 TABLET FIVE DAYS A WEEK AND 1/2 TABLET ON SATURDAY AND SUNDAY 08/17/20   Almyra Deforest, PA  metoprolol succinate (TOPROL-XL) 100 MG 24 hr tablet TAKE 1 TABLET (100 MG TOTAL) BY MOUTH DAILY WITH OR IMMEDIATELY FOLLOWING A MEAL 01/08/20   Almyra Deforest, PA  Multiple Vitamins-Minerals (MULTIVITAMINS THER. W/MINERALS) TABS Take 1 tablet by mouth daily.      [provider]  nitroGLYCERIN (NITROSTAT) 0.4 MG SL tablet Place 1 tablet (0.4 mg total) under the tongue every 5 (five) minutes as needed for chest pain. 05/07/14   [name], Peter M, MD  PROAIR HFA 108 (90 BASE) MCG/ACT inhaler Inhale 2 puffs into the lungs every 4 (four) hours as needed for shortness of breath.  10/22/13   [provider]  ramipril (ALTACE) 10 MG capsule TAKE 1 CAPSULE EVERY DAY 10/07/20   [name], Peter M, MD  spironolactone (ALDACTONE) 25 MG tablet TAKE 1/2 TABLET EVERY DAY (NEED MD APPOINTMENT) 10/12/20   Allred, Jeneen Rinks, MD  temazepam (RESTORIL) 7.5 MG capsule Take 1 capsule (7.5 mg total) by mouth at bedtime as needed for sleep. 10/17/13   Barton Dubois, MD  vitamin B-12 (CYANOCOBALAMIN) 1000 MCG tablet Take 1,000 mcg by mouth daily.    [provider]    Allergies    Iron, Lescol [fluvastatin sodium], Pentazocine lactate, Vytorin [ezetimibe-simvastatin], and Aspirin  Review of Systems   Review of Systems  Constitutional: Negative for fever.  HENT: Negative for rhinorrhea and sore throat.   Eyes: Negative for redness.  Respiratory: Negative for cough and  shortness of breath.   Cardiovascular: Negative for chest pain.  Gastrointestinal: Positive for blood in stool. Negative for abdominal pain, diarrhea, nausea and vomiting.  Genitourinary: Negative for dysuria and hematuria.  Musculoskeletal: Negative for myalgias.  Skin: Negative for rash.  Neurological: Positive for weakness. Negative for headaches.    Physical Exam Updated Vital Signs BP (!) 133/105   Pulse (!) 113   Temp (!) 102.2 F (39 C) (Rectal)   Resp 18   Ht 5\' 9"  (1.753 m)   Wt 62 kg   SpO2 100%   BMI 20.18 kg/m   Physical Exam Vitals and nursing note reviewed.  Constitutional:      Appearance: He is well-developed.     Comments:  Warm to touch  HENT:     Head: Normocephalic and atraumatic.     Ears:     Comments: Hard of hearing Eyes:     General:        Right eye: No discharge.        Left eye: No discharge.     Conjunctiva/sclera: Conjunctivae normal.  Cardiovascular:     Rate and Rhythm: Tachycardia present. Rhythm irregular.     Heart sounds: Normal heart sounds.     Comments: Pacemaker noted left upper chest wall Pulmonary:     Effort: Pulmonary effort is normal.     Breath sounds: Normal breath sounds. No wheezing or rales.  Abdominal:     Palpations: Abdomen is soft.     Tenderness: There is no abdominal tenderness. There is no guarding or rebound.  Genitourinary:    Comments: Normal sphincter tone.  Maroon blood noted on finger. Musculoskeletal:     Cervical back: Normal range of motion and neck supple.  Skin:    General: Skin is warm and dry.     Coloration: Skin is pale.     Comments: Developing superficial hematoma L forearm -- IV placed by EMS infiltrated on arrival and removed.   Neurological:     General: No focal deficit present.     Mental Status: He is alert.     ED Results / Procedures / Treatments   Labs (all labs ordered are listed, but only abnormal results are displayed) Labs Reviewed  RESP PANEL BY RT-PCR (FLU A&B,  COVID) ARPGX2 - Abnormal; Notable for the following components:      Result Value   SARS Coronavirus 2 by RT PCR POSITIVE (*)    All other components within normal limits  LACTIC ACID, PLASMA - Abnormal; Notable for the following components:   Lactic Acid, Venous 2.0 (*)    All other components within normal limits  COMPREHENSIVE METABOLIC PANEL - Abnormal; Notable for the following components:   Glucose, Bld 118 (*)    Total Protein 5.9 (*)    Albumin 3.3 (*)    Alkaline Phosphatase 31 (*)    All other components within normal limits  CBC WITH DIFFERENTIAL/PLATELET - Abnormal; Notable for the following components:   RBC 2.92 (*)    Hemoglobin 9.8 (*)    HCT 29.3 (*)    MCV 100.3 (*)    Platelets 136 (*)    Neutro Abs 8.5 (*)    Lymphs Abs 0.3 (*)    All other components within normal limits  MAGNESIUM - Abnormal; Notable for the following components:   Magnesium 1.6 (*)    All other components within normal limits  POC OCCULT BLOOD, ED - Abnormal; Notable for the following components:   Fecal Occult Bld POSITIVE (*)    All other components within normal limits  TROPONIN I (HIGH SENSITIVITY) - Abnormal; Notable for the following components:   Troponin I (High Sensitivity) 63 (*)    All other components within normal limits  URINE CULTURE  CULTURE, BLOOD (ROUTINE X 2)  CULTURE, BLOOD (ROUTINE X 2)  LACTIC ACID, PLASMA  PROTIME-INR  APTT  URINALYSIS, ROUTINE W REFLEX MICROSCOPIC  TYPE AND SCREEN    EKG EKG Interpretation  Date/Time:  Friday October 23 2020 11:19:16 EST Ventricular Rate:  107 PR Interval:    QRS Duration: 150 QT Interval:  345 QTC Calculation: 461 R Axis:   -63 Text Interpretation: Atrial fibrillation Left bundle branch block No significant change  since last tracing Confirmed by Deno Etienne [phone]) on 10/23/2020 11:23:32 AM   Radiology DG Chest Port 1 View  Result Date: 10/23/2020 CLINICAL DATA:  Questionable sepsis. EXAM: PORTABLE CHEST 1 VIEW  COMPARISON:  October 14, 2013. FINDINGS: Similar mildly enlarged cardiac silhouette with median sternotomy. Pulmonary vascular congestion. Left subclavian approach cardiac rhythm maintenance device. Aortic atherosclerosis. Streaky left basilar opacities. No confluent consolidation. No visible pleural effusions or pneumothorax. No acute osseous abnormality. IMPRESSION: 1. Low lung volumes with streaky left basilar opacities, which may represent atelectasis, aspiration, and/or pneumonia. No confluent consolidation. 2. Mild cardiomegaly and pulmonary vascular congestion. Electronically Signed   By: Margaretha Sheffield MD   On: 10/23/2020 11:54    Procedures Procedures (including critical care time)  Medications Ordered in ED Medications  magnesium sulfate IVPB 2 g 50 mL (has no administration in time range)  potassium chloride SA (KLOR-CON) CR tablet 40 mEq (has no administration in time range)  sodium chloride 0.9 % bolus 500 mL (0 mLs Intravenous Stopped 10/23/20 1215)  cefTRIAXone (ROCEPHIN) 1 g in sodium chloride 0.9 % 100 mL IVPB (0 g Intravenous Stopped 10/23/20 1307)  azithromycin (ZITHROMAX) 500 mg in sodium chloride 0.9 % 250 mL IVPB (0 mg Intravenous Stopped 10/23/20 1341)  acetaminophen (TYLENOL) tablet 650 mg (650 mg Oral Given 10/23/20 1238)  etomidate (AMIDATE) 2 MG/ML injection (8 mg  Given 10/23/20 1257)  0.9 %  sodium chloride infusion (1,000 mLs Intravenous Not Given 10/23/20 1257)  etomidate (AMIDATE) injection (8 mg Intravenous Given 10/23/20 1257)  sodium chloride 0.9 % bolus 1,000 mL (0 mLs Intravenous Stopped 10/23/20 1343)    ED Course  I have reviewed the triage vital signs and the nursing notes.  Pertinent labs & imaging results that were available during my care of the patient were reviewed by me and considered in my medical decision making (see chart for details).  Patient seen and examined. Work-up initiated. Medications ordered.   Vital signs reviewed and are as  follows: BP (!) 133/105   Pulse (!) 113   Temp (!) 102.2 F (39 C) (Rectal)   Resp 18   Ht 5\' 9"  (1.753 m)   Wt 62 kg   SpO2 100%   BMI 20.18 kg/m   Pt discussed with Dr. Tyrone Nine. BP 133/105.   Notified by RN that patient was in [place]. Reported immediately to bedside. Wide complex tachycardia noted 170-190+. Pt awake and responsive. Dr. Tyrone Nine called to bedside. BP low 09'N systolic.   Pt sedated and synchronized cardioversion performed x 2. Return to original afib, LBBB. Pt recovering.   1:12 PM Spoke with cardiology, Mickel Baas, to request consult. They will see patient.   1:17 PM Pt remains stable post-cardioversion. Systolic BP 235, HR afib 573.   1:37 PM Pt rechecked. HR 105-115. BP 90s/70s. WIll discuss with medicine.  1:51 PM Spoke with Dr. Tamala Julian who will see.   2:08 PM Pt rechecked and is unchanged.   CRITICAL CARE Performed by: Carlisle Cater PA-C Total critical care time: 60 minutes Critical care time was exclusive of separately billable procedures and treating other patients. Critical care was necessary to treat or prevent imminent or life-threatening deterioration. Critical care was time spent personally by me on the following activities: development of treatment plan with patient and/or surrogate as well as nursing, discussions with consultants, evaluation of patient's response to treatment, examination of patient, obtaining history from patient or surrogate, ordering and performing treatments and interventions, ordering and review of laboratory studies, ordering  and review of radiographic studies, pulse oximetry and re-evaluation of patient's condition.  2:30 PM COVID+ noted.      MDM Rules/Calculators/A&P                          Admit.    Final Clinical Impression(s) / ED Diagnoses Final diagnoses:  Sepsis with acute respiratory failure without septic shock, due to unspecified organism, unspecified whether hypoxia or hypercapnia present ([place])  Lower GI bleed    Wide-complex tachycardia ([place])  COVID-19    Rx / DC Orders ED Discharge Orders    None       Carlisle Cater, PA-C 10/23/20 [place], DO 10/23/20 1458

## 2020-10-23 NOTE — Consult Note (Signed)
Cardiology Consultation:   Patient ID: [name] MRN: [id number]; DOB: May 30, 1932  Admit date: 10/23/2020 Date of Consult: 10/23/2020  Primary Care Provider: Crist Infante, MD [facility] Cardiologist: Peter [name], MD  [facility] Electrophysiologist:  [name], MD   Patient Profile:   [name] [name] is a 86 y.o. male with a hx of CAD, ICM-EF 35-40% 2012 and ICD last device change 09/2015 to PPM only, s/p PPM, HTN, HLD, recurrent GI bleed so not a candidate for anitcoagualiton, PAF, remote inferolateral MI, CABG 2002,  who is being seen today for the evaluation of tachycardia with GI bleed at the request of Dr. Tyrone Nine.  History of Present Illness:   [name] [name] with above hx and SJM dual chamber PPM 2016, ICD removed at that time with improved EF to 40% and device was replaced with just PPM. He has hx of PAF and significant GI bleed on anticoagulation with coumadin.  Not a candidate for anticoagulation or a watchman candidate. Hx CABG 2002. No cath since. Last nuc was 2009 but unable to pull up results.   Last echo 2017 EF 40-45% Mild AR, Moderate MR, LA moderately dilated. RV mildly dilated. RA mildly dilated.  Pk PA pressure 71 mmHg. Now in permanent Afib  Presented to the ED on 11/26 with weakness. Daughter reportedly found him, slumped over and slow responding, no his usual. + dark stools for 2 days.  BP 100/70 HR 90s to 110.    EKG:  The EKG demonstrated:  Atrial fib rate of 107 with aberent beats vs PVCs.   Then WCT at 189 (concerning for VT) then next EKG WCT to 199 given amidate and cardioverted with follow up EKG showing Afib RVR with aberrancy Telemetry:  Telemetry was personally reviewed and demonstrates:  Afib  =--> On my review of the EKGs, the wide-complex tachycardia episodes do appear to potentially be V. tach because there is of native pulmonary of the precordial leads, however we cannot be sure with baseline aberrancy.  Currently is now in an much more  rate controlled A. fib in the 70s to 90s.  Blood pressures are in the low 100s to 90s  Na 141, K+ 3.7 BUN 15 Cr 1.12 lactic acid 1.7 and now 2.0 Mg+ 1.6  Hgb 9.8 WBC 9.4  plts 136  Stool heme +   CXR  IMPRESSION: 1. Low lung volumes with streaky left basilar opacities, which may represent atelectasis, aspiration, and/or pneumonia. No confluent consolidation. 2. Mild cardiomegaly and pulmonary vascular congestion.  BP 93/54 P 116 temp 102.2 rect. COVID positive   Past Medical History:  Diagnosis Date  . CAD (coronary artery disease)    Old inferolateral MI s/p CABG in 2002  . CAP (community acquired pneumonia) 10/03/2011  . Cardiomyopathy    s/p ICD 09/30/08; EF 35 to 40% per echo in October 2021  . Chronic systolic CHF (congestive heart failure) ([place]) 07/24/2013  . Colon cancer ([place])    s/p chemo, RT, sugery  . Colorectal anastomotic stricture   . Diverticulosis   . ED (erectile dysfunction)   . GERD (gastroesophageal reflux disease)    rarely  . HTN (hypertension)   . ICD (implantable cardiac defibrillator) discharge October 2012   ICD shock for afib with RVR  . Iron deficiency anemia   . Paroxysmal atrial fibrillation (HCC)    controlled with amiodarone, not a coumadin candidate due to GI bleeding    Past Surgical History:  Procedure Laterality Date  . APPENDECTOMY    .  BACK SURGERY    . BIOPSY  09/22/2018   Procedure: BIOPSY;  Surgeon: Gatha Mayer, MD;  Location: [facility];  Service: Endoscopy;;  . CARDIAC CATHETERIZATION  11/07/2000   EF 32%  . CARDIAC DEFIBRILLATOR PLACEMENT  09/30/08   MDT dual chamber ICD placed by Dr Rayann Heman  . CARDIOVASCULAR STRESS TEST  08/06/2008   EF 30%  . CHOLECYSTECTOMY    . COLON RESECTION     x2  . COLONOSCOPY     multiple  . COLONOSCOPY N/A 09/22/2018   Procedure: COLONOSCOPY;  Surgeon: Gatha Mayer, MD;  Location: [facility];  Service: Endoscopy;  Laterality: N/A;  . CORONARY ARTERY BYPASS GRAFT    . EP IMPLANTABLE  DEVICE N/A 10/15/2015   Procedure:  PPM Generator Changeout;  Surgeon: [name], MD;  Location: [facility];  Service: Cardiovascular;  Laterality: N/A;  . HERNIA REPAIR    . US ECHOCARDIOGRAPHY  08/11/2008   EF 30-35%  . VASECTOMY       Home Medications:  Prior to Admission medications   Medication Sig Start Date End Date Taking? Authorizing Provider  BESIVANCE 0.6 % SUSP Place 1 drop into the right eye 3 (three) times daily. 08/23/18  Yes [provider]  cholecalciferol (VITAMIN D) 1000 UNITS tablet Take 1,000 Units by mouth daily.    Yes [provider]  digoxin (LANOXIN) 0.125 MG tablet Take 0.5 tablets (62.5 mcg total) by mouth daily. 01/08/20  Yes Almyra Deforest, PA  diphenoxylate-atropine (LOMOTIL) 2.5-0.025 MG tablet Take 1 tablet by mouth 4 (four) times daily as needed for diarrhea or loose stools.  12/27/19  Yes [provider]  DUREZOL 0.05 % EMUL Place 1 drop into the right eye 3 (three) times daily. 08/23/18  Yes [provider]  furosemide (LASIX) 40 MG tablet TAKE 1 TABLET FIVE DAYS A WEEK AND 1/2 TABLET ON SATURDAY AND SUNDAY Patient taking differently: Take 10-20 mg by mouth See admin instructions. TAKE 1 TABLET FIVE DAYS A WEEK AND 1/2 TABLET ON SATURDAY AND SUNDAY 08/17/20  Yes Almyra Deforest, PA  metoprolol succinate (TOPROL-XL) 100 MG 24 hr tablet TAKE 1 TABLET (100 MG TOTAL) BY MOUTH DAILY WITH OR IMMEDIATELY FOLLOWING A MEAL Patient taking differently: Take 100 mg by mouth daily.  01/08/20  Yes Almyra Deforest, PA  Multiple Vitamins-Minerals (MULTIVITAMINS THER. W/MINERALS) TABS Take 1 tablet by mouth daily.     Yes [provider]  PROAIR HFA 108 (90 BASE) MCG/ACT inhaler Inhale 2 puffs into the lungs every 4 (four) hours as needed for shortness of breath.  10/22/13  Yes [provider]  ramipril (ALTACE) 10 MG capsule TAKE 1 CAPSULE EVERY DAY Patient taking differently: Take 10 mg by mouth daily.  10/07/20  Yes [name], Peter  M, MD  spironolactone (ALDACTONE) 25 MG tablet TAKE 1/2 TABLET EVERY DAY (NEED MD APPOINTMENT) Patient taking differently: Take 12.5 mg by mouth daily.  10/12/20  Yes Allred, Jeneen Rinks, MD  vitamin B-12 (CYANOCOBALAMIN) 1000 MCG tablet Take 1,000 mcg by mouth daily.   Yes [provider]  nitroGLYCERIN (NITROSTAT) 0.4 MG SL tablet Place 1 tablet (0.4 mg total) under the tongue every 5 (five) minutes as needed for chest pain. Patient not taking: Reported on 10/23/2020 05/07/14   [name], Peter M, MD  temazepam (RESTORIL) 7.5 MG capsule Take 1 capsule (7.5 mg total) by mouth at bedtime as needed for sleep. Patient not taking: Reported on 10/23/2020 10/17/13   Barton Dubois, MD  Inpatient Medications: Scheduled Meds:  Continuous Infusions: . sodium chloride 1,000 mL (10/23/20 1257)  . azithromycin 500 mg (10/23/20 1237)   PRN Meds: sodium chloride, etomidate  Allergies:    Allergies  Allergen Reactions  . Iron     Stomach issues  . Lescol [Fluvastatin Sodium]     Dizziness  . Pentazocine Lactate     unknown  . Vytorin [Ezetimibe-Simvastatin]     Leg cramps   . Aspirin     Bleeding     Social History:   Social History   Socioeconomic History  . Marital status: Married    Spouse name: Not on file  . Number of children: 2  . Years of education: Not on file  . Highest education level: Not on file  Occupational History  . Occupation: Retired     Fish farm manager: RETIRED  Tobacco Use  . Smoking status: Former Smoker    Types: Cigars  . Smokeless tobacco: Never Used  Vaping Use  . Vaping Use: Never used  Substance and Sexual Activity  . Alcohol use: No  . Drug use: No  . Sexual activity: Not Currently  Other Topics Concern  . Not on file  Social History Narrative   Lives in [place] with spouse.  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 Difficulty of Paying Living Expenses: Not on file  Food Insecurity:   . Worried About Charity fundraiser in the Last Year: Not on file  . Ran Out of Food in the Last Year: Not on file  Transportation Needs:   . Lack of Transportation (Medical): Not on file  . Lack of Transportation (Non-Medical): Not on file  Physical Activity:   . Days of Exercise per Week: Not on file  . Minutes of Exercise per Session: Not on file  Stress:   . Feeling of Stress : Not on file  Social Connections:   . Frequency of Communication with Friends and Family: Not on file  . Frequency of Social Gatherings with Friends and Family: Not on file  . Attends Religious Services: Not on file  . Active Member of Clubs or Organizations: Not on file  . Attends Archivist Meetings: Not on file  . Marital Status: Not on file  Intimate Partner Violence:   . Fear of Current or Ex-Partner: Not on file  . Emotionally Abused: Not on file  . Physically Abused: Not on file  . Sexually Abused: Not on file    Family History:    Family History  Problem Relation Age of Onset  . Colon cancer Father   . Diabetes Sister   . Colon polyps Brother   . Pancreatic cancer Brother 40     ROS:  Please see the history of present illness.  General:no colds or fevers, no weight changes Skin:no rashes or ulcers HEENT:no blurred vision, no congestion CV:see HPI PUL:see HPI GI:no diarrhea constipation ++ melena with dark stools, no indigestion GU:no hematuria, no dysuria MS:no joint pain, no claudication Neuro:no syncope, no lightheadedness Endo:no diabetes, no thyroid disease  All other ROS reviewed and negative.     Physical Exam/Data:   Vitals:   10/23/20 1247 10/23/20 1305 10/23/20 1310 10/23/20 1311  BP:  109/77 (!) 80/55 94/66  Pulse:  Resp:  16 (!) 22 (!) 29  Temp:      TempSrc:      SpO2: 95%     Weight:      Height:        Intake/Output Summary (Last 24 hours) at  10/23/2020 1328 Last data filed at 10/23/2020 1307 Gross per 24 hour  Intake 700 ml  Output --  Net 700 ml   Last 3 Weights 10/23/2020 06/15/2020 01/08/2020  Weight (lbs) 136 lb 11 oz 137 lb 140 lb  Weight (kg) 62 kg 62.143 kg 63.504 kg     Body mass index is 20.18 kg/m.  General: Very thin, frail elderly gentleman, in bed, minimally responsive to verbal stimuli. ->  Barely intelligible speech HEENT: Mouth breather; borderline temporal wasting --> unable to assess cranial nerves etc. because of lack of responsiveness. Neck: no JVD Vascular: Diminished pedal pulses, palpable.  Feet are warm to touch. Cardiac: Distant heart sounds with irregularly irregular rhythm.  No obvious M/R/G Lungs: Diminished breath sounds, with bibasal crackles no obvious wheezes or rhonchi.  No coughing Abd: Soft, NT/ND says NABS.  No HSM. Ext: no edema Musculoskeletal:  No deformities, no edema Skin: warm and dry  Neuro/Psych: Does not really respond enough to follow commands for cranial nerves.  He does arouse to touch and loud voice  Relevant CV Studies: Echo 01/04/16 Study Conclusions   - Left ventricle: Inferior wall hypokinesis The cavity size was  moderately dilated. Wall thickness was increased in a pattern of  mild LVH. Systolic function was mildly to moderately reduced. The  estimated ejection fraction was in the range of 40% to 45%.  - Aortic valve: There was mild regurgitation.  - Mitral valve: There was moderate regurgitation.  - Left atrium: The atrium was moderately dilated.  - Right ventricle: The cavity size was mildly dilated.  - Right atrium: The atrium was mildly dilated.  - Atrial septum: No defect or patent foramen ovale was identified.  - Pulmonary arteries: PA peak pressure: 71 mm Hg (S).   Laboratory Data:  High Sensitivity Troponin:  No results for input(s): TROPONINIHS in the last 720 hours.   Chemistry Recent Labs  Lab 10/23/20 1129  NA 141  K 3.7  CL 105  CO2  26  GLUCOSE 118*  BUN 15  CREATININE 1.12  CALCIUM 9.1  GFRNONAA >60  ANIONGAP 10    Recent Labs  Lab 10/23/20 1129  PROT 5.9*  ALBUMIN 3.3*  AST 37  ALT 22  ALKPHOS 31*  BILITOT 1.2   Hematology Recent Labs  Lab 10/23/20 1129  WBC 9.4  RBC 2.92*  HGB 9.8*  HCT 29.3*  MCV 100.3*  MCH 33.6  MCHC 33.4  RDW 14.3  PLT 136*   BNPNo results for input(s): BNP, PROBNP in the last 168 hours.  DDimer No results for input(s): DDIMER in the last 168 hours.   Radiology/Studies:  DG Chest Port 1 View  Result Date: 10/23/2020 CLINICAL DATA:  Questionable sepsis. EXAM: PORTABLE CHEST 1 VIEW COMPARISON:  October 14, 2013. FINDINGS: Similar mildly enlarged cardiac silhouette with median sternotomy. Pulmonary vascular congestion. Left subclavian approach cardiac rhythm maintenance device. Aortic atherosclerosis. Streaky left basilar opacities. No confluent consolidation. No visible pleural effusions or pneumothorax. No acute osseous abnormality. IMPRESSION: 1. Low lung volumes with streaky left basilar opacities, which may represent atelectasis, aspiration, and/or pneumonia. No confluent consolidation. 2. Mild cardiomegaly and pulmonary vascular congestion. Electronically Signed   By: Margaretha Sheffield MD  On: 10/23/2020 11:54     Assessment and Plan:   [name] [name] is a 86 y.o. male with a hx of CAD, ICM-EF 35-40% 2012 and ICD last device change 09/2015 to PPM only, s/p PPM, HTN, HLD, recurrent GI bleed so not a candidate for anitcoagualiton, PAF, remote inferolateral MI, CABG 2002,  who is being seen today for the evaluation of tachycardia with GI bleed at the request of Dr. Tyrone Nine.  1. WCT with sedation and DCCV: conversion to Afib RVR afterwards. Mg + at 1.6, replacement ordered. Would plan to start on IV amiodarone given concern for VTach. Replace mag+ keep to 2.0 and K+ keep to 4.0  Cannot be certain but it does appear to be VT on the 2 precardioversion EKGs-and now back in A.  fib with controlled rate.  His blood pressures are not adequate to put him back on his 100 mg Toprol, therefore we should use an IV rate related/antiarrhythmic given his potential VT. -->  Agree with IV amiodarone but can treat without loading dose.  We can convert to p.o. once completed IV 24-hour run. 2. Permanent atrial fib: has elevated CHA2DS2VASc  No anticoagulation due to significant GI bleed in the past.   With chronic permanent A. fib, unlikely to convert even with amiodarone.  Would not use amiodarone long-term. 3. GI bleed: with heme + stools GI to see. 4. Hx oF NICM: had an ICD at one point but with improved EF at [facility] only PPM was replaced.  2017 EF 40-45% 5. COVID: +swab, further management per primary  For questions or updates, please contact [place] Please consult [email or web address] for contact info under    Signed, Cecilie Kicks, NP  10/23/2020 1:28 PM    ATTENDING ATTESTATION  I have seen, examined and evaluated the patient this PM in the ER (I personally saw and examined while APP served as scribe with chart interrogation).  After reviewing all the available data and chart, we discussed the patients laboratory, study & physical findings as well as symptoms in detail. I agree with her findings, examination as well as impression recommendations as per our discussion.    Attending adjustments noted in italics.   Chronically ill elderly gentleman with CAD/ICM, permanent A. fib now with PPM in place (when initial ICD was changed after generator exchange, only PPM placed as EF was improved) who presents with fever, decreased level of responsiveness and malaise with GI bleed.  During initial assessment, he ended up going into what appears to be rapid wide-complex tachycardia with flipped polarity of precordial leads concerning for V. tach.  Was shocked back to permanent A. fib, rate improved.  Blood pressures still relatively low consistent with ongoing illness/Systemic  Inflammatory Response Syndrome.  At this point, rate control with IV medication is the best option with borderline blood pressures.  Recommend IV amiodarone loading without bolus due to low blood pressures.  Once able to take p.o., and blood pressures are more tolerant, can reinitiate beta-blocker.  Despite elevated CHA2DS2-VASc score, he is not an anticoagulation candidate.  Nor do I think there is any chance of [place] cardioverting him with amiodarone.  (Especially since he did not cardiovert with DCCV).  Recommendation at this point will be to treat underlying illness including -> Covid infection and melanotic stools. -> Likely would not tolerate antihypertensive medications until more stable.   Other than checking a 2D echocardiogram just to get new baseline EF, no additional cardiac recommendations at this time. Replete electrolytes, and  monitor.   Glenetta Hew, M.D., M.S. Interventional Cardiologist   Pager # [phone] Phone # [phone] [address] [place], [place] [address]

## 2020-10-23 NOTE — ED Triage Notes (Signed)
BIB EMS W/ complaints of feeling weak. EMS reported that daughter found patient at home slumped over with head down, not responding as he usually would so this prompted the daughter to call EMS. Pt has a hx of GI bleeding and family has noted black tarry, stools the past two days, pt has felt weak for 4-5 months, usually ambulates with a walker but has been unable to do so in the recent days. Pt hypotensive w/ EMS 100/70-BP, HR 90s-110 Resp- 18 SpO2 97%.

## 2020-10-23 NOTE — Progress Notes (Signed)
Patient's daughter [name]) called and she was updated and pt's admission questions completed with daughter.

## 2020-10-23 NOTE — Plan of Care (Signed)
  Problem: Education: Goal: Knowledge of risk factors and measures for prevention of condition will improve Outcome: Progressing   Problem: Coping: Goal: Psychosocial and spiritual needs will be supported Outcome: Progressing   Problem: Respiratory: Goal: Will maintain a patent airway Outcome: Progressing Goal: Complications related to the disease process, condition or treatment will be avoided or minimized Outcome: Progressing   Problem: Education: Goal: Knowledge of General Education information will improve Description: Including pain rating scale, medication(s)/side effects and non-pharmacologic comfort measures Outcome: Progressing   Problem: Health Behavior/Discharge Planning: Goal: Ability to manage health-related needs will improve Outcome: Progressing   Problem: Clinical Measurements: Goal: Diagnostic test results will improve Outcome: Progressing   Problem: Activity: Goal: Risk for activity intolerance will decrease Outcome: Progressing   Problem: Nutrition: Goal: Adequate nutrition will be maintained Outcome: Progressing   Problem: Elimination: Goal: Will not experience complications related to urinary retention Outcome: Progressing

## 2020-10-23 NOTE — ED Notes (Signed)
Interrogated pacemaker 

## 2020-10-23 NOTE — Plan of Care (Signed)
  Problem: Education: Goal: Knowledge of risk factors and measures for prevention of condition will improve Outcome: Progressing   Problem: Coping: Goal: Psychosocial and spiritual needs will be supported Outcome: Progressing   Problem: Respiratory: Goal: Will maintain a patent airway Outcome: Progressing Goal: Complications related to the disease process, condition or treatment will be avoided or minimized Outcome: Progressing   

## 2020-10-23 NOTE — H&P (Signed)
History and Physical    [name] [name] EHU:[id number] DOB: 1932-10-22 DOA: 10/23/2020  Referring MD/NP/PA: Alecia Lemming, PA-C PCP: Crist Infante, MD  Patient coming from: home   Chief Complaint: Weakness  I have personally briefly reviewed patient's old medical records in [place]   HPI: [name] [name] is a 86 y.o. male with medical history significant of CAD s/p CABG in 2002, s/p PPM, ischemic cardiomyopathy, systolic CHF, HTN, HLD, PAF not on anticoagulation secondary to recurrent GI bleed, and history of colon cancer presents with complaints of weakness. Normally patient ambulates with the use of a walker with his son and daughter checking on throughout the day.  Patient was found at home slumped over in a chair with his head down not able to respond for which his daughter called EMS.  Last weekend family had taken him out to eat with friends.  The following day he was noted to complain of not feeling well.  3 days ago they have noted that he has been having black tarry stools.  During Thanksgiving yesterday he kept falling asleep throughout the meal seem to be tired.  He had denied any complaints of fever, cough, abdominal pain, nausea, or vomiting.  Family notes he had Covid-19 vaccines back in January and February of this year,  but had not received his booster yet.  In route with EMS patient was noted to have blood pressure 100/70, respirations 18, heart rates 90-110, and O2 saturation maintained.  ED Course: Upon admission patient was seen to be febrile up to 102.2 F with blood pressures  80/55-133/105, respirations 15-30, and placed on 3 L of nasal cannula oxygen.  Patient was noted to go from atrial fibrillation into a wide-complex tachycardia with heart rates into the 200s.  Patient was given etomidate and cardioverted. labs were significant for WBC 9.4, hemoglobin 9.8, albumin 3.3, lactic acid 1.7->2.  Stools were noted to be dark with appearance of fluid.  Chest x-ray revealed  atelectasis with low lung volumes. GI and cardiology had already been consulted.  COVID-19 screening did come back positive.   TRH called to admit.  Review of Systems  Unable to perform ROS: Acuity of condition  Constitutional: Positive for malaise/fatigue.  Eyes: Negative for photophobia and pain.  Respiratory: Positive for shortness of breath. Negative for cough.   Cardiovascular: Negative for chest pain and leg swelling.  Gastrointestinal: Positive for blood in stool. Negative for abdominal pain, nausea and vomiting.  Neurological: Positive for weakness. Negative for focal weakness and loss of consciousness.  Psychiatric/Behavioral: Negative for substance abuse.    Past Medical History:  Diagnosis Date  . CAD (coronary artery disease)    Old inferolateral MI s/p CABG in 2002  . CAP (community acquired pneumonia) 10/03/2011  . Cardiomyopathy    s/p ICD 09/30/08; EF 35 to 40% per echo in October 2021  . Chronic systolic CHF (congestive heart failure) ([place]) 07/24/2013  . Colon cancer ([place])    s/p chemo, RT, sugery  . Colorectal anastomotic stricture   . Diverticulosis   . ED (erectile dysfunction)   . GERD (gastroesophageal reflux disease)    rarely  . HTN (hypertension)   . ICD (implantable cardiac defibrillator) discharge October 2012   ICD shock for afib with RVR  . Iron deficiency anemia   . Paroxysmal atrial fibrillation ([facility])    controlled with amiodarone, not a coumadin candidate due to GI bleeding    Past Surgical History:  Procedure Laterality Date  .  APPENDECTOMY    . BACK SURGERY    . BIOPSY  09/22/2018   Procedure: BIOPSY;  Surgeon: Gatha Mayer, MD;  Location: [facility];  Service: Endoscopy;;  . CARDIAC CATHETERIZATION  11/07/2000   EF 32%  . CARDIAC DEFIBRILLATOR PLACEMENT  09/30/08   MDT dual chamber ICD placed by Dr Rayann Heman  . CARDIOVASCULAR STRESS TEST  08/06/2008   EF 30%  . CHOLECYSTECTOMY    . COLON RESECTION     x2  . COLONOSCOPY     multiple    . COLONOSCOPY N/A 09/22/2018   Procedure: COLONOSCOPY;  Surgeon: Gatha Mayer, MD;  Location: [facility];  Service: Endoscopy;  Laterality: N/A;  . CORONARY ARTERY BYPASS GRAFT    . EP IMPLANTABLE DEVICE N/A 10/15/2015   Procedure:  PPM Generator Changeout;  Surgeon: [name], MD;  Location: [facility];  Service: Cardiovascular;  Laterality: N/A;  . HERNIA REPAIR    . US ECHOCARDIOGRAPHY  08/11/2008   EF 30-35%  . VASECTOMY       reports that he has quit smoking. His smoking use included cigars. He has never used smokeless tobacco. He reports that he does not drink alcohol and does not use drugs.  Allergies  Allergen Reactions  . Iron     Stomach issues  . Lescol [Fluvastatin Sodium]     Dizziness  . Pentazocine Lactate     unknown  . Vytorin [Ezetimibe-Simvastatin]     Leg cramps   . Aspirin     Bleeding     Family History  Problem Relation Age of Onset  . Colon cancer Father   . Diabetes Sister   . Colon polyps Brother   . Pancreatic cancer Brother 67    Prior to Admission medications   Medication Sig Start Date End Date Taking? Authorizing Provider  BESIVANCE 0.6 % SUSP Place 1 drop into the right eye 3 (three) times daily. 08/23/18  Yes [provider]  cholecalciferol (VITAMIN D) 1000 UNITS tablet Take 1,000 Units by mouth daily.    Yes [provider]  digoxin (LANOXIN) 0.125 MG tablet Take 0.5 tablets (62.5 mcg total) by mouth daily. 01/08/20  Yes Almyra Deforest, PA  diphenoxylate-atropine (LOMOTIL) 2.5-0.025 MG tablet Take 1 tablet by mouth 4 (four) times daily as needed for diarrhea or loose stools.  12/27/19  Yes [provider]  DUREZOL 0.05 % EMUL Place 1 drop into the right eye 3 (three) times daily. 08/23/18  Yes [provider]  furosemide (LASIX) 40 MG tablet TAKE 1 TABLET FIVE DAYS A WEEK AND 1/2 TABLET ON SATURDAY AND SUNDAY Patient taking differently: Take 10-20 mg by mouth See admin instructions. TAKE 1  TABLET FIVE DAYS A WEEK AND 1/2 TABLET ON SATURDAY AND SUNDAY 08/17/20  Yes Almyra Deforest, PA  metoprolol succinate (TOPROL-XL) 100 MG 24 hr tablet TAKE 1 TABLET (100 MG TOTAL) BY MOUTH DAILY WITH OR IMMEDIATELY FOLLOWING A MEAL Patient taking differently: Take 100 mg by mouth daily.  01/08/20  Yes Almyra Deforest, PA  Multiple Vitamins-Minerals (MULTIVITAMINS THER. W/MINERALS) TABS Take 1 tablet by mouth daily.     Yes [provider]  PROAIR HFA 108 (90 BASE) MCG/ACT inhaler Inhale 2 puffs into the lungs every 4 (four) hours as needed for shortness of breath.  10/22/13  Yes [provider]  ramipril (ALTACE) 10 MG capsule TAKE 1 CAPSULE EVERY DAY Patient taking differently: Take 10 mg by mouth daily.  10/07/20  Yes [name],  Ander Slade, MD  spironolactone (ALDACTONE) 25 MG tablet TAKE 1/2 TABLET EVERY DAY (NEED MD APPOINTMENT) Patient taking differently: Take 12.5 mg by mouth daily.  10/12/20  Yes Allred, Jeneen Rinks, MD  vitamin B-12 (CYANOCOBALAMIN) 1000 MCG tablet Take 1,000 mcg by mouth daily.   Yes [provider]  nitroGLYCERIN (NITROSTAT) 0.4 MG SL tablet Place 1 tablet (0.4 mg total) under the tongue every 5 (five) minutes as needed for chest pain. Patient not taking: Reported on 10/23/2020 05/07/14   [name], Peter M, MD  temazepam (RESTORIL) 7.5 MG capsule Take 1 capsule (7.5 mg total) by mouth at bedtime as needed for sleep. Patient not taking: Reported on 10/23/2020 10/17/13   Barton Dubois, MD    Physical Exam:  Constitutional: NAD, calm, comfortable Vitals:   10/23/20 1310 10/23/20 1311 10/23/20 1320 10/23/20 1330  BP: (!) 80/55 94/66 (!) 100/58 (!) 96/57  Pulse:   (!) 131 (!) 45  Resp: (!) 22 (!) 29 15 19   Temp:      TempSrc:      SpO2:   90% 94%  Weight:      Height:       Eyes: PERRL, lids and conjunctivae normal ENMT: Mucous membranes are moist. Posterior pharynx clear of any exudate or lesions.Normal dentition.  Neck: normal, supple, no masses, no  thyromegaly Respiratory: clear to auscultation bilaterally, no wheezing, no crackles. Normal respiratory effort. No accessory muscle use.  Cardiovascular: Regular rate and rhythm, no murmurs / rubs / gallops. No extremity edema. 2+ pedal pulses. No carotid bruits.  Abdomen: no tenderness, no masses palpated. No hepatosplenomegaly. Bowel sounds positive.  Musculoskeletal: no clubbing / cyanosis. No joint deformity upper and lower extremities. Good ROM, no contractures. Normal muscle tone.  Skin: no rashes, lesions, ulcers. No induration Neurologic: CN 2-12 grossly intact. Sensation intact, DTR normal. Strength 5/5 in all 4.  Psychiatric: Normal judgment and insight. Alert and oriented x 3. Normal mood.     Labs on Admission: I have personally reviewed following labs and imaging studies  CBC: Recent Labs  Lab 10/23/20 1129  WBC 9.4  NEUTROABS 8.5*  HGB 9.8*  HCT 29.3*  MCV 100.3*  PLT 846*   Basic Metabolic Panel: Recent Labs  Lab 10/23/20 1129  NA 141  K 3.7  CL 105  CO2 26  GLUCOSE 118*  BUN 15  CREATININE 1.12  CALCIUM 9.1   GFR: Estimated Creatinine Clearance: 41.5 mL/min (by C-G formula based on SCr of 1.12 mg/dL). Liver Function Tests: Recent Labs  Lab 10/23/20 1129  AST 37  ALT 22  ALKPHOS 31*  BILITOT 1.2  PROT 5.9*  ALBUMIN 3.3*   No results for input(s): LIPASE, AMYLASE in the last 168 hours. No results for input(s): AMMONIA in the last 168 hours. Coagulation Profile: Recent Labs  Lab 10/23/20 1129  INR 1.2   Cardiac Enzymes: No results for input(s): CKTOTAL, CKMB, CKMBINDEX, TROPONINI in the last 168 hours. BNP (last 3 results) No results for input(s): PROBNP in the last 8760 hours. HbA1C: No results for input(s): HGBA1C in the last 72 hours. CBG: No results for input(s): GLUCAP in the last 168 hours. Lipid Profile: No results for input(s): CHOL, HDL, LDLCALC, TRIG, CHOLHDL, LDLDIRECT in the last 72 hours. Thyroid Function Tests: No  results for input(s): TSH, T4TOTAL, FREET4, T3FREE, THYROIDAB in the last 72 hours. Anemia Panel: No results for input(s): VITAMINB12, FOLATE, FERRITIN, TIBC, IRON, RETICCTPCT in the last 72 hours. Urine analysis:    Component Value  Date/Time   COLORURINE YELLOW 10/14/2013 1510   APPEARANCEUR CLEAR 10/14/2013 1510   LABSPEC 1.017 10/14/2013 1510   PHURINE 6.0 10/14/2013 1510   GLUCOSEU NEGATIVE 10/14/2013 1510   HGBUR NEGATIVE 10/14/2013 1510   BILIRUBINUR NEGATIVE 10/14/2013 1510   KETONESUR NEGATIVE 10/14/2013 1510   PROTEINUR 30 (A) 10/14/2013 1510   UROBILINOGEN 1.0 10/14/2013 1510   NITRITE NEGATIVE 10/14/2013 1510   LEUKOCYTESUR NEGATIVE 10/14/2013 1510   Sepsis Labs: No results found for this or any previous visit (from the past 240 hour(s)).   Radiological Exams on Admission: DG Chest Port 1 View  Result Date: 10/23/2020 CLINICAL DATA:  Questionable sepsis. EXAM: PORTABLE CHEST 1 VIEW COMPARISON:  October 14, 2013. FINDINGS: Similar mildly enlarged cardiac silhouette with median sternotomy. Pulmonary vascular congestion. Left subclavian approach cardiac rhythm maintenance device. Aortic atherosclerosis. Streaky left basilar opacities. No confluent consolidation. No visible pleural effusions or pneumothorax. No acute osseous abnormality. IMPRESSION: 1. Low lung volumes with streaky left basilar opacities, which may represent atelectasis, aspiration, and/or pneumonia. No confluent consolidation. 2. Mild cardiomegaly and pulmonary vascular congestion. Electronically Signed   By: Margaretha Sheffield MD   On: 10/23/2020 11:54    EKG: Independently reviewed.  Atrial fibrillation with left bundle branch block with suspected PVCs and 107 bpm, but repeat EKG with the wide-complex tachycardia at 189-200 bpm  Assessment/Plan Weakness  Sepsis secondary to pneumonia due to Covid-19: Patient presents with complaints of weakness after being found slumped in a chair.  Found to be febrile up  to 102.2 F with tachycardia and tachypnea.  Lactic acid trended up from 1.7->2.  Work-up revealed concern for possible atelectasis versus pneumonia and incidentally being found to be COVID-19 positive. -Admit to a progressive bed -Continuous pulse oximetry with nasal cannula oxygen to maintain O2 saturation greater than 90% -Albuterol inhaler -Remdesivir IV -Solu-Medrol IV -Vitamin C and zinc -Check inflammatory markers, and continue to monitor daily -PT/OT to evaluation and treat  -Continue Rocephin and azithromycin due to elevated procalcitonin  GI bleed with Macrocytic anemia: Patient reports blood in stools starting 3 days ago.  Hemoglobin 9.8 g/dL on admission which appears similar to prior baseline. -Serial monitoring H&H, and transfuse blood products as needed for hemoglobins less than 8 g/dL -Appreciate GI consultative services, we will follow-up for any further recommendations  Atrial fibrillation wide-complex tachycardia: Patient not a candidate coagulation due to history of recurrent GI bleed.  While in the ED noted to be in atrial fibrillation, but went into a wide-complex tachycardia with heart rates in the 180s.  Required cardioversion x2. -Cardiology consulted, will follow-up for further recommendation -Goal magnesium 2 and potassium 4   -Amiodarone bolus with drip per cardiology -Appreciate cardiology consultative services  Hypomagnesemia: Acute.  Magnesium noted to be 1.6 on admission.  Patient had been ordered 2 g of magnesium sulfate. -Continue to monitor and replace as needed  Hypoalbuminemia: Albumin 3.3. -Check prealbumin in a.m.  Thrombocytopenia: Chronic.  Platelet count 136.   -Continue to monitor  DVT prophylaxis: SCDs   Code Status: Full Family Communication: Discussed plan of care with the patient's daughter  Disposition Plan: To be determined Consults called: Cardiology, GI Admission status: Inpatient requiring more than 2 midnight stay due to  severity of symptoms and diagnosis of Covid  Norval Morton MD [facility] Pager [phone]   If 7PM-7AM, please contact night-coverage [email or web address] Password [email or web address]  10/23/2020, 1:52 PM

## 2020-10-23 NOTE — Consult Note (Signed)
[facility] Consult: 1:29 PM 10/23/2020  LOS: 0 days    Referring Provider: Burt Ek in ED.    Primary Care Physician:  Crist Infante, MD Primary Gastroenterologist:  Dr. Carlean Purl  Daughter is [name] [name]: No phone [phone]   Reason for Consultation: Dark stools.      HPI: [name] [name] is a 86 y.o. male.  PMH remote colon cancer, occurred twice, the last occurrence in 2000.  Treated with surgery, radiation, chemo.  CAD.  CABG 2002 remote MI.  Cardiomyopathy.  Systolic CHF.  S/p pacemaker 2016 with removal of ICD at that time as EF had improved to 40%.  PAF.  Anemia, on po B12.   No anticoagulation or antiplatelet medication on his med list.  Per current cardiology note he is not a candidate for anticoagulation or watchman procedure.  Most recent colonoscopy 08/2018.  For ealuation of hematochezia.  Findings included nonbleeding ulcerations with stigmata of recent bleeding in the rectum and sigmoid, biopsies obtained.  Changes of previous end-to-end ileocolonic anastomosis at the transverse colon characterized by severe stenosis, unable to pass scope beyond this area.  Patent end-to-end colo-colonic anastomosis associated with ulceration but no stricture in the distal sigmoid.  Dr. Carlean Purl wondered if this was ischemia vs prolapse vs anastomotic dysfunction vs IBD (MD doubted IBD).  Biopsies showed colonic mucosa with ulceration and granulation tissue, acute and chronic inflammation without malignancy.  No specific etiology offered.  Patient periodically will have brief periods of seeing bright red blood than dark stools over the course of a day or 2.  This happens maybe every few months.  Not associated with pain, nausea, vomiting, change of appetite.  On Tuesday evening he had an occurrence of BRB  transitioning to dark stools.  By Wednesday these had resolved.  More somnolence than usual on Wednesday, seemed "a little bit out of it "per his daughter.  But when she went to visit that evening he had changed his close and fixed himself a sandwich.  Yesterday he seemed more tired and did not eat much.  This morning when family visited he was disoriented and quite weak.  Unable to get himself up from a seated position to standing which he is normally capable of doing.  No shortness of breath, no cough, no fevers, no chills.  No swelling, Patient's daughter denies that patient has had weakness for several weeks as outlined in some of the notes.  She said he been doing well, eating well and had no worrisome issues. 100/70, HR 90s to 110 per EMS.    Hgb 9.8.  Last comp was 08/2018 1 Hgb 9.7.  MCV is 100.  Platelets 136, previously normal. INR 1.2. No renal insufficiency or elevated BUN.  LFTs not elevated. Portable chest x-ray shows low lung volumes, streaky left basilar opacity, could reflect atelectasis/aspiration/pneumonia.  Mild cardiomegaly and pulmonary vascular congestion.  While in the ED around 1245 noted to have arrhythmia with V. Tach.  Tested positive for COVID-19, he was vaccinated earlier this year twice.  The first time  he developed weakness, near syncope.  The second time he was weak.  Booster delayed because he had planned resection of skin cancer from his ears.  At home not taking PPI, H2 blocker, antacids, NSAIDs, ASA No alcohol.  Lives alone but family comes in stays with him for several hours at least twice a day.  Widower since January 2021    Past Medical History:  Diagnosis Date   CAD (coronary artery disease)    Old inferolateral MI s/p CABG in 2002   CAP (community acquired pneumonia) 10/03/2011   Cardiomyopathy    s/p ICD 09/30/08; EF 35 to 40% per echo in October 0174   Chronic systolic CHF (congestive heart failure) ([place]) 07/24/2013   Colon cancer (HCC)    s/p  chemo, RT, sugery   Colorectal anastomotic stricture    Diverticulosis    ED (erectile dysfunction)    GERD (gastroesophageal reflux disease)    rarely   HTN (hypertension)    ICD (implantable cardiac defibrillator) discharge October 2012   ICD shock for afib with RVR   Iron deficiency anemia    Paroxysmal atrial fibrillation ([place])    controlled with amiodarone, not a coumadin candidate due to GI bleeding    Past Surgical History:  Procedure Laterality Date   APPENDECTOMY     BACK SURGERY     BIOPSY  09/22/2018   Procedure: BIOPSY;  Surgeon: Gatha Mayer, MD;  Location: [facility];  Service: Endoscopy;;   CARDIAC CATHETERIZATION  11/07/2000   EF 32%   CARDIAC DEFIBRILLATOR PLACEMENT  09/30/08   MDT dual chamber ICD placed by Dr Rayann Heman   CARDIOVASCULAR STRESS TEST  08/06/2008   EF 30%   CHOLECYSTECTOMY     COLON RESECTION     x2   COLONOSCOPY     multiple   COLONOSCOPY N/A 09/22/2018   Procedure: COLONOSCOPY;  Surgeon: Gatha Mayer, MD;  Location: [facility];  Service: Endoscopy;  Laterality: N/A;   CORONARY ARTERY BYPASS GRAFT     EP IMPLANTABLE DEVICE N/A 10/15/2015   Procedure:  PPM Generator Changeout;  Surgeon: [name], MD;  Location: [facility];  Service: Cardiovascular;  Laterality: N/A;   HERNIA REPAIR     US ECHOCARDIOGRAPHY  08/11/2008   EF 30-35%   VASECTOMY      Prior to Admission medications   Medication Sig Start Date End Date Taking? Authorizing Provider  BESIVANCE 0.6 % SUSP Place 1 drop into the right eye 3 (three) times daily. 08/23/18  Yes [provider]  cholecalciferol (VITAMIN D) 1000 UNITS tablet Take 1,000 Units by mouth daily.    Yes [provider]  digoxin (LANOXIN) 0.125 MG tablet Take 0.5 tablets (62.5 mcg total) by mouth daily. 01/08/20  Yes Almyra Deforest, PA  diphenoxylate-atropine (LOMOTIL) 2.5-0.025 MG tablet Take 1 tablet by mouth 4 (four) times daily as needed for diarrhea or loose  stools.  12/27/19  Yes [provider]  DUREZOL 0.05 % EMUL Place 1 drop into the right eye 3 (three) times daily. 08/23/18  Yes [provider]  furosemide (LASIX) 40 MG tablet TAKE 1 TABLET FIVE DAYS A WEEK AND 1/2 TABLET ON SATURDAY AND SUNDAY Patient taking differently: Take 10-20 mg by mouth See admin instructions. TAKE 1 TABLET FIVE DAYS A WEEK AND 1/2 TABLET ON SATURDAY AND SUNDAY 08/17/20  Yes Almyra Deforest, PA  metoprolol succinate (TOPROL-XL) 100 MG 24 hr tablet TAKE 1 TABLET (100 MG TOTAL) BY MOUTH DAILY WITH  OR IMMEDIATELY FOLLOWING A MEAL Patient taking differently: Take 100 mg by mouth daily.  01/08/20  Yes Almyra Deforest, PA  Multiple Vitamins-Minerals (MULTIVITAMINS THER. W/MINERALS) TABS Take 1 tablet by mouth daily.     Yes [provider]  PROAIR HFA 108 (90 BASE) MCG/ACT inhaler Inhale 2 puffs into the lungs every 4 (four) hours as needed for shortness of breath.  10/22/13  Yes [provider]  ramipril (ALTACE) 10 MG capsule TAKE 1 CAPSULE EVERY DAY Patient taking differently: Take 10 mg by mouth daily.  10/07/20  Yes [name], Peter M, MD  spironolactone (ALDACTONE) 25 MG tablet TAKE 1/2 TABLET EVERY DAY (NEED MD APPOINTMENT) Patient taking differently: Take 12.5 mg by mouth daily.  10/12/20  Yes Allred, Jeneen Rinks, MD  vitamin B-12 (CYANOCOBALAMIN) 1000 MCG tablet Take 1,000 mcg by mouth daily.   Yes [provider]  nitroGLYCERIN (NITROSTAT) 0.4 MG SL tablet Place 1 tablet (0.4 mg total) under the tongue every 5 (five) minutes as needed for chest pain. Patient not taking: Reported on 10/23/2020 05/07/14   [name], Peter M, MD  temazepam (RESTORIL) 7.5 MG capsule Take 1 capsule (7.5 mg total) by mouth at bedtime as needed for sleep. Patient not taking: Reported on 10/23/2020 10/17/13   Barton Dubois, MD    Scheduled Meds:  Infusions:  sodium chloride 1,000 mL (10/23/20 1257)   azithromycin 500 mg (10/23/20 1237)   PRN Meds: sodium chloride,  etomidate   Allergies as of 10/23/2020 - Review Complete 10/23/2020  Allergen Reaction Noted   Iron  03/04/2013   Lescol [fluvastatin sodium]  03/04/2013   Pentazocine lactate  06/30/2011   Vytorin [ezetimibe-simvastatin]  03/04/2013   Aspirin  10/14/2011    Family History  Problem Relation Age of Onset   Colon cancer Father    Diabetes Sister    Colon polyps Brother    Pancreatic cancer Brother 36    Social History   Socioeconomic History   Marital status: Married    Spouse name: Not on file   Number of children: 2   Years of education: Not on file   Highest education level: Not on file  Occupational History   Occupation: Retired     Fish farm manager: RETIRED  Tobacco Use   Smoking status: Former Smoker    Types: Cigars   Smokeless tobacco: Never Used  Scientific laboratory technician Use: Never used  Substance and Sexual Activity   Alcohol use: No   Drug use: No   Sexual activity: Not Currently  Other Topics Concern   Not on file  Social History Narrative   Lives in [place] with spouse.  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)   1 son, 1 daughter   Daily caffeine    As of 03/05/2013         Social Determinants of Health   Financial Resource Strain:    Difficulty of Paying Living Expenses: Not on file  Food Insecurity:    Worried About Charity fundraiser in the Last Year: Not on file   [employer] of Food in the Last Year: Not on file  Transportation Needs:    Lack of Transportation (Medical): Not on file   Lack of Transportation (Non-Medical): Not on file  Physical Activity:    Days of Exercise per Week: Not on file   Minutes of Exercise per Session: Not on file  Stress:    Feeling of Stress : Not on file  Social Connections:  Frequency of Communication with Friends and Family: Not on file   Frequency of Social Gatherings with Friends and Family: Not on file   Attends Religious Services: Not on file   Active Member of  Clubs or Organizations: Not on file   Attends Archivist Meetings: Not on file   Marital Status: Not on file  Intimate Partner Violence:    Fear of Current or Ex-Partner: Not on file   Emotionally Abused: Not on file   Physically Abused: Not on file   Sexually Abused: Not on file    REVIEW OF SYSTEMS: Constitutional: Weakness as per HPI ENT:  No nose bleeds Pulm: Denies dyspnea, denies cough. CV:  No signs of palpitations, no LE edema.  No angina. GU:  No hematuria, no frequency GI: See HPI. Heme: No iron usual or excessive bleeding. Transfusions: 1 PRBC in 09/2011 Neuro:  No headaches, no peripheral tingling or numbness Derm:  No itching, no rash or sores.  Endocrine:  No sweats or chills.  No polyuria or dysuria Immunization: Reviewed.  COVID-19 vaccinations as per HPI. Travel:  None beyond local counties in last few months.    PHYSICAL EXAM: Vital signs in last 24 hours: Vitals:   10/23/20 1310 10/23/20 1311  BP: (!) 80/55 94/66  Pulse:    Resp: (!) 22 (!) 29  Temp:    SpO2:     Wt Readings from Last 3 Encounters:  10/23/20 62 kg  06/15/20 62.1 kg  01/08/20 63.5 kg    General: Weak, pale, ill-appearing elderly male resting comfortably on the bed. Head: Facial asymmetry or swelling.  No signs of head trauma. Eyes: No scleral icterus or conjunctival pallor.  EOMI Ears: Not hard of hearing Nose: No congestion or discharge Mouth: Poor dentition.  Mucosa slightly dry but pink and clear.  Tongue midline. Neck: No JVD, no masses, no thyromegaly Lungs: Clear to auscultation in front.  No cough.  No labored breathing at rest. Heart: Regularly irregular, rate on telemetry monitor in the 120s.  No MRG.  S1, S2 present.  Pacemaker in position subcutaneously in upper left chest Abdomen: Thin, soft, nontender.  No HSM, masses, bruits, hernias.  Bowel sounds normal quality but hypoactive. Rectal: Per ED PA-C there was maroon blood on exam finger, tone normal.   No masses. Musc/Skeltl: No joint redness, swelling or gross deformity. Extremities: No CCE. Neurologic: Alert.  Appropriate.  Oriented x3.  Laconic but clear and fluent speech.  Moves all 4 limbs without tremor, strength not tested. Skin: Pale.  No rash, no sores, no signs of trauma.  No large bruises. Nodes: No cervical adenopathy Psych: Flat affect, calm.  Intake/Output from previous day: No intake/output data recorded. Intake/Output this shift: Total I/O In: 700 [IV Piggyback:700] Out: -   LAB RESULTS: Recent Labs    10/23/20 1129  WBC 9.4  HGB 9.8*  HCT 29.3*  PLT 136*   BMET Lab Results  Component Value Date   NA 141 10/23/2020   NA 140 09/23/2018   NA 137 09/22/2018   K 3.7 10/23/2020   K 4.0 09/23/2018   K 4.1 09/22/2018   CL 105 10/23/2020   CL 108 09/23/2018   CL 107 09/22/2018   CO2 26 10/23/2020   CO2 25 09/23/2018   CO2 23 09/22/2018   GLUCOSE 118 (H) 10/23/2020   GLUCOSE 83 09/23/2018   GLUCOSE 76 09/22/2018   BUN 15 10/23/2020   BUN 19 09/23/2018   BUN 17 09/22/2018  CREATININE 1.12 10/23/2020   CREATININE 1.04 09/23/2018   CREATININE 0.97 09/22/2018   CALCIUM 9.1 10/23/2020   CALCIUM 8.9 09/23/2018   CALCIUM 8.7 (L) 09/22/2018   LFT Recent Labs    10/23/20 1129  PROT 5.9*  ALBUMIN 3.3*  AST 37  ALT 22  ALKPHOS 31*  BILITOT 1.2   PT/INR Lab Results  Component Value Date   INR 1.2 10/23/2020   INR 1.07 07/01/2018   INR 1.18 10/02/2011   Hepatitis Panel No results for input(s): HEPBSAG, HCVAB, HEPAIGM, HEPBIGM in the last 72 hours. C-Diff No components found for: CDIFF Lipase     Component Value Date/Time   LIPASE 19 07/01/2018 1510    Drugs of Abuse  No results found for: LABOPIA, COCAINSCRNUR, LABBENZ, AMPHETMU, THCU, LABBARB   RADIOLOGY STUDIES: DG Chest Port 1 View  Result Date: 10/23/2020 CLINICAL DATA:  Questionable sepsis. EXAM: PORTABLE CHEST 1 VIEW COMPARISON:  October 14, 2013. FINDINGS: Similar mildly  enlarged cardiac silhouette with median sternotomy. Pulmonary vascular congestion. Left subclavian approach cardiac rhythm maintenance device. Aortic atherosclerosis. Streaky left basilar opacities. No confluent consolidation. No visible pleural effusions or pneumothorax. No acute osseous abnormality. IMPRESSION: 1. Low lung volumes with streaky left basilar opacities, which may represent atelectasis, aspiration, and/or pneumonia. No confluent consolidation. 2. Mild cardiomegaly and pulmonary vascular congestion. Electronically Signed   By: Margaretha Sheffield MD   On: 10/23/2020 11:54     IMPRESSION:   *  GI bleed, maroon stools on DRE.  Red blood than dark stools reported by family. ? Lower vs upper GI source.  BUN normal.    *    Stable anemia, macrocytosis.  Daily po B 12 at home.    *    COVID-19 positive.  CXR with left basilar opacity, question pneumonia versus aspiration versus atelectasis.  WBCs WNL.  No fever. Remdesivir, methylprednisolone ordered.  Has received a dose of Rocephin and azithromycin.  *    Ventricular tachycardia, one rate as high as 194, now dropped down to 1 teens.  *   Colon cancer x 2 occurring 19 years apart.  Latest in 2000.   Latest colonoscopy 08/2018.  Ulceration and stricture at prox anastomosis, ulceration w/o stricture at distal anastomosis w benign inflammatory path on biopsy.       PLAN:     *   Likely will pursue EGD once his cardiac status stabilizes.  Until then patient can have at least clear liquids, perhaps even full liquids or solid food.  *   Start Protonix 40 mg IV bid.    *   Anemia studies.      Azucena Freed  10/23/2020, 1:29 PM Phone [phone]

## 2020-10-24 ENCOUNTER — Inpatient Hospital Stay (HOSPITAL_COMMUNITY): Payer: Medicare HMO

## 2020-10-24 LAB — CBC WITH DIFFERENTIAL/PLATELET
Abs Immature Granulocytes: 0.06 10*3/uL (ref 0.00–0.07)
Basophils Absolute: 0 10*3/uL (ref 0.0–0.1)
Basophils Relative: 0 %
Eosinophils Absolute: 0 10*3/uL (ref 0.0–0.5)
Eosinophils Relative: 0 %
HCT: 28.7 % — ABNORMAL LOW (ref 39.0–52.0)
Hemoglobin: 9.3 g/dL — ABNORMAL LOW (ref 13.0–17.0)
Immature Granulocytes: 1 %
Lymphocytes Relative: 5 %
Lymphs Abs: 0.5 10*3/uL — ABNORMAL LOW (ref 0.7–4.0)
MCH: 31.5 pg (ref 26.0–34.0)
MCHC: 32.4 g/dL (ref 30.0–36.0)
MCV: 97.3 fL (ref 80.0–100.0)
Monocytes Absolute: 0.4 10*3/uL (ref 0.1–1.0)
Monocytes Relative: 4 %
Neutro Abs: 10.5 10*3/uL — ABNORMAL HIGH (ref 1.7–7.7)
Neutrophils Relative %: 90 %
Platelets: 125 10*3/uL — ABNORMAL LOW (ref 150–400)
RBC: 2.95 MIL/uL — ABNORMAL LOW (ref 4.22–5.81)
RDW: 14.5 % (ref 11.5–15.5)
WBC: 11.6 10*3/uL — ABNORMAL HIGH (ref 4.0–10.5)
nRBC: 0 % (ref 0.0–0.2)

## 2020-10-24 LAB — COMPREHENSIVE METABOLIC PANEL
ALT: 25 U/L (ref 0–44)
AST: 40 U/L (ref 15–41)
Albumin: 2.8 g/dL — ABNORMAL LOW (ref 3.5–5.0)
Alkaline Phosphatase: 31 U/L — ABNORMAL LOW (ref 38–126)
Anion gap: 10 (ref 5–15)
BUN: 15 mg/dL (ref 8–23)
CO2: 23 mmol/L (ref 22–32)
Calcium: 8.3 mg/dL — ABNORMAL LOW (ref 8.9–10.3)
Chloride: 107 mmol/L (ref 98–111)
Creatinine, Ser: 0.93 mg/dL (ref 0.61–1.24)
GFR, Estimated: >60 mL/min (ref >60)
Glucose, Bld: 120 mg/dL — ABNORMAL HIGH (ref 70–99)
Potassium: 3.9 mmol/L (ref 3.5–5.1)
Sodium: 140 mmol/L (ref 135–145)
Total Bilirubin: 1 mg/dL (ref 0.3–1.2)
Total Protein: 5.3 g/dL — ABNORMAL LOW (ref 6.5–8.1)

## 2020-10-24 LAB — CBC
HCT: 29.9 % — ABNORMAL LOW (ref 39.0–52.0)
HCT: 32.9 % — ABNORMAL LOW (ref 39.0–52.0)
Hemoglobin: 9.6 g/dL — ABNORMAL LOW (ref 13.0–17.0)
Hemoglobin: 10.4 g/dL — ABNORMAL LOW (ref 13.0–17.0)
MCH: 31.4 pg (ref 26.0–34.0)
MCH: 31.6 pg (ref 26.0–34.0)
MCHC: 32.1 g/dL (ref 30.0–36.0)
MCHC: 31.6 g/dL (ref 30.0–36.0)
MCV: 97.7 fL (ref 80.0–100.0)
MCV: 100 fL (ref 80.0–100.0)
Platelets: 123 10*3/uL — ABNORMAL LOW (ref 150–400)
Platelets: 158 10*3/uL (ref 150–400)
RBC: 3.06 MIL/uL — ABNORMAL LOW (ref 4.22–5.81)
RBC: 3.29 MIL/uL — ABNORMAL LOW (ref 4.22–5.81)
RDW: 14.5 % (ref 11.5–15.5)
RDW: 14.5 % (ref 11.5–15.5)
WBC: 13.2 10*3/uL — ABNORMAL HIGH (ref 4.0–10.5)
WBC: 17.6 10*3/uL — ABNORMAL HIGH (ref 4.0–10.5)
nRBC: 0 % (ref 0.0–0.2)
nRBC: 0 % (ref 0.0–0.2)

## 2020-10-24 LAB — C-REACTIVE PROTEIN: CRP: 13.5 mg/dL — ABNORMAL HIGH (ref <1.0)

## 2020-10-24 LAB — PREALBUMIN: Prealbumin: 9.5 mg/dL — ABNORMAL LOW (ref 18–38)

## 2020-10-24 LAB — PHOSPHORUS: Phosphorus: 3.5 mg/dL (ref 2.5–4.6)

## 2020-10-24 LAB — D-DIMER, QUANTITATIVE: D-Dimer, Quant: 1.91 ug/mL-FEU — ABNORMAL HIGH (ref 0.00–0.50)

## 2020-10-24 LAB — FERRITIN: Ferritin: 85 ng/mL (ref 24–336)

## 2020-10-24 LAB — MAGNESIUM: Magnesium: 2.4 mg/dL (ref 1.7–2.4)

## 2020-10-24 MED ORDER — POTASSIUM CHLORIDE CRYS ER 20 MEQ PO TBCR
40.0000 meq | EXTENDED_RELEASE_TABLET | Freq: Once | ORAL | Status: AC
  Administered 2020-10-24: 40 meq via ORAL
  Filled 2020-10-24: qty 2

## 2020-10-24 MED ORDER — CHLORHEXIDINE GLUCONATE CLOTH 2 % EX PADS
6.0000 | MEDICATED_PAD | Freq: Every day | CUTANEOUS | Status: DC
  Administered 2020-10-24 – 2020-11-04 (×12): 6 via TOPICAL

## 2020-10-24 NOTE — Evaluation (Signed)
Physical Therapy Evaluation Patient Details Name: [name] [name] MRN: [id number] DOB: 09-Nov-1932 Today's Date: 10/24/2020   History of Present Illness  86 y.o. male with medical history significant of CAD s/p CABG in 2002, s/p PPM, ischemic cardiomyopathy, systolic CHF, HTN, HLD, PAF not on anticoagulation secondary to recurrent GI bleed, back surgery, and history of colon cancer presented 10/23/20 with complaints of weakness, fever, tarry stools x 3 days. +COVID pneumonia with sepsis; while in ED episode of wide-complex tachycardia with hypotension requiring emergent cardioversion.  Clinical Impression   Pt admitted with above diagnosis. Patient provided his recent functional status prior to admission with ?accuracy (lives alone, walks with straight cane, walks his dog outside twice per day). Patient currently required min assist for slight unsteadiness during OOB to chair. Primarily limited by his continued elevated HR (afib with RVR) with HR generally 118-140, however with pivot to chair HR max 162 bpm. Attempted to discuss with pt if family can provide 24/7 supervision/assist and he was unable to clearly state. Pt currently with functional limitations due to the deficits listed below (see PT Problem List). Pt will benefit from skilled PT to increase their independence and safety with mobility to allow discharge to the venue listed below.       Follow Up Recommendations Home health PT;Supervision/Assistance - 24 hour (if family cannot provide 24/7, CIR vs SNF)    Equipment Recommendations  None recommended by PT    Recommendations for Other Services       Precautions / Restrictions Precautions Precautions: Fall Precaution Comments: reports fell recently      Mobility  Bed Mobility Overal bed mobility: Needs Assistance Bed Mobility: Supine to Sit     Supine to sit: Min assist     General bed mobility comments: incr time and effort to try to scoot out once in sitting; assisted  with bed pad due to elevating HR    Transfers Overall transfer level: Needs assistance Equipment used: Straight cane Transfers: Sit to/from Stand Sit to Stand: Min assist         General transfer comment: slight steadying assist upon standing  Ambulation/Gait Ambulation/Gait assistance: Min assist Gait Distance (Feet): 5 Feet Assistive device: Straight cane Gait Pattern/deviations: Step-to pattern;Decreased stride length;Trunk flexed     General Gait Details: pt initially walking the wrong direction from instructions; guided back towards his chair  Stairs            Wheelchair Mobility    Modified Rankin (Stroke Patients Only)       Balance Overall balance assessment: Needs assistance Sitting-balance support: No upper extremity supported;Feet supported Sitting balance-Leahy Scale: Good       Standing balance-Leahy Scale: Poor                               Pertinent Vitals/Pain Pain Assessment: No/denies pain    Home Living Family/patient expects to be discharged to:: Private residence Living Arrangements: Alone Available Help at Discharge: Family;Available PRN/intermittently Type of Home: Apartment Home Access: Level entry     Home Layout: One level Home Equipment: Cane - single point;Walker - 2 wheels      Prior Function Level of Independence: Needs assistance   Gait / Transfers Assistance Needed: walks with straight cane; doesn't like using a walker "too much trouble"; was walking from car into restaurants; states he takes his dog out twice per day  ADL's / Homemaking Assistance Needed: family provides groceries;  stands at sink to bathe;   Comments: all history per pt with ?accuracy     Hand Dominance   Dominant Hand: Right    Extremity/Trunk Assessment   Upper Extremity Assessment Upper Extremity Assessment: Defer to OT evaluation    Lower Extremity Assessment Lower Extremity Assessment: Generalized weakness     Cervical / Trunk Assessment Cervical / Trunk Assessment: Kyphotic  Communication   Communication: HOH  Cognition Arousal/Alertness: Awake/alert Behavior During Therapy: WFL for tasks assessed/performed Overall Cognitive Status: No family/caregiver present to determine baseline cognitive functioning Area of Impairment: Orientation;Memory;Safety/judgement;Awareness                 Orientation Level: Place;Time;Situation (easily oriented to hospital)   Memory: Decreased short-term memory (repeats stories)   Safety/Judgement: Decreased awareness of safety;Decreased awareness of deficits Awareness: Intellectual   General Comments: despite showing the pt the recliner to his left, when he stood he began turning to walk to the right; manual faciliation to redirect to the chair      General Comments      Exercises     Assessment/Plan    PT Assessment Patient needs continued PT services  PT Problem List Decreased strength;Decreased activity tolerance;Decreased balance;Decreased mobility;Decreased cognition;Decreased knowledge of use of DME;Decreased safety awareness;Cardiopulmonary status limiting activity       PT Treatment Interventions DME instruction;Gait training;Functional mobility training;Therapeutic activities;Therapeutic exercise;Balance training;Cognitive remediation;Patient/family education    PT Goals (Current goals can be found in the Care Plan section)  Acute Rehab PT Goals Patient Stated Goal: go home ASAP PT Goal Formulation: With patient Time For Goal Achievement: 11/07/20 Potential to Achieve Goals: Good    Frequency Min 3X/week   Barriers to discharge Decreased caregiver support ?family can provide 24/7 supervision/assist    Co-evaluation               AM-PAC PT "6 Clicks" Mobility  Outcome Measure Help needed turning from your back to your side while in a flat bed without using bedrails?: A Little Help needed moving from lying on your  back to sitting on the side of a flat bed without using bedrails?: A Little Help needed moving to and from a bed to a chair (including a wheelchair)?: A Little Help needed standing up from a chair using your arms (e.g., wheelchair or bedside chair)?: A Little Help needed to walk in hospital room?: A Little Help needed climbing 3-5 steps with a railing? : A Lot 6 Click Score: 17    End of Session Equipment Utilized During Treatment: Gait belt Activity Tolerance: Treatment limited secondary to medical complications (Comment) (elevated HR) Patient left: in chair;with call bell/phone within reach;with chair alarm set Nurse Communication: Mobility status;Other (comment) (elevated HR) PT Visit Diagnosis: Unsteadiness on feet (R26.81);Muscle weakness (generalized) (M62.81);History of falling (Z91.81)    Time: 2563-8937 PT Time Calculation (min) (ACUTE ONLY): 55 min   Charges:   PT Evaluation $PT Eval Low Complexity: 1 Low PT Treatments $Therapeutic Activity: 23-37 mins         Arby Barrette, PT Pager [phone]   Rexanne Mano 10/24/2020, 3:26 PM

## 2020-10-24 NOTE — Progress Notes (Addendum)
PROGRESS NOTE                                                                                                                                                                                                             Patient Demographics:    [name] [name], is a 86 y.o. male, DOB - 09-Mar-1932, ZYS:[id number]  Outpatient Primary MD for the patient is Crist Infante, MD   Admit date - 10/23/2020   LOS - 1  Chief Complaint  Patient presents with  . Weakness       Brief Narrative: Patient is a 86 y.o. male with PMHx of CAD s/p CABG in 2002, s/p PPM implantation, chronic systolic heart failure, chronic atrial fibrillation-not on anticoagulation (history of recurrent GI bleeding), history of colon cancer, history of recurrent GI bleeding-who presented with a 3-4-day history of weakness, dark appearing stools.  In the emergency room-he was found to be febrile.  While in the ED-he had an episode of wide-complex tachycardia associated with hypotension-required emergent cardioversion.  He was also found to be COVID-19 positive and acute urinary retention.  He was subsequently admitted to the hospitalist service.  See below for further details.  COVID-19 vaccinated status: Vaccinated (but not a booster)  Significant Events: 11/26>> Admit to [facility] for generalized weakness, fever, and dark-colored stools-COVID-19 positive.  Had episode of wide-complex tachycardia with hypotension requiring emergent cardioversion. 11/26>> acute urinary retention-difficult Foley catheter -18 French coud catheter placed by urology.  Significant studies: 11/26>>Chest x-ray: Streaky left basilar opacities, mild cardiomegaly and pulmonary vascular congestion. 11/27>> chest x-ray: Bilateral patchy opacities.  COVID-19 medications: Steroids: 11/26>> Remdesivir: 11/26>>  Antibiotics: None  Microbiology data: 11/26 >>blood culture: No  growth  Procedures: 11/26>> cardioversion by ED MD for wide-complex tachycardia (presumed V. tach) associated with hypotension.  Consults: Cardiology, GI, urology  DVT prophylaxis: SCDs Start: 10/23/20 1506   Subjective:    [name] [name] today feels better-and is inquiring when he can go home.  Remains on amiodarone infusion with heart rate in the 130s-140s range.  Claims that he continues to have dark-colored stools.   Assessment  & Plan :   Acute Hypoxic Resp Failure due to Covid 19 Viral pneumonia: Appears to have mild disease-continue steroid/Remdesivir.  Follow closely-if hypoxia worsens-may need Actemra/baricitinib.  Fever: afebrile O2 requirements:  SpO2: 96 % O2 Flow Rate (L/min):  3 L/min   COVID-19 Labs: Recent Labs    10/23/20 1640 10/24/20 0341  DDIMER 2.06* 1.91*  FERRITIN 71 85  LDH 201*  --   CRP 7.5* 13.5*       Component Value Date/Time   BNP 541.6 (H) 10/23/2020 1640   BNP 379.4 (H) 02/25/2016 0951    Recent Labs  Lab 10/23/20 1640  PROCALCITON 0.63    Lab Results  Component Value Date   SARSCOV2NAA POSITIVE (A) 10/23/2020     Prone/Incentive Spirometry: encouraged  incentive spirometry use 3-4/hour.  Sepsis: Likely secondary to COVID-19 infection-sepsis physiology resolving-blood cultures negative so far.  Doubt bacterial infection-do not feel patient requires antimicrobial therapy as of yet.  Continue to monitor closely.  Wide-complex tachycardia: Reviewed ED/cardiology notes-concern for V. tach-cardioverted by EDMD on admission on 11/26.  On amiodarone infusion currently.  Chronic atrial fibrillation with RVR: Continue telemetry monitoring-given history of recurrent GI bleeding-not anticoagulation candidate.  Remains on amiodarone infusion-await further input from cardiology.  Chronic systolic heart failure: Euvolemic-await updated echocardiogram.  GI bleeding: Not clear what this is upper or lower-claims she continues to have bile  colored stools however hemoglobin is relatively stable since admission-GI following-we will keep on PPI. Continue to monitor hemoglobin closely.  Acute urinary retention: S/p coud catheter insertion by urology on 11/26-recommendations-continue Flomax-and keep catheter for 1 week prior to initiating a voiding trial.  Deconditioning: Secondary to acute illness-superimposed on some amount of chronic debility.  Await PT/OT eval.  RN pressure injury documentation: Pressure Injury 10/23/20 Coccyx (Active)  10/23/20 1600  Location: Coccyx  Location Orientation:   Staging:   Wound Description (Comments):   Present on Admission:     GI prophylaxis: PPI  ABG:    Component Value Date/Time   HCO3 22.8 06/11/2007 1615   TCO2 24 06/11/2007 1615   ACIDBASEDEF 1.0 06/11/2007 1615    Vent Settings: N/A    Condition - Extremely Guarded  Family Communication  : Left voicemail for daughter [name] space [phone])  Code Status :  Full Code  Diet :  Diet Order            Diet clear liquid Room service appropriate? Yes; Fluid consistency: Thin  Diet effective now                  Disposition Plan  :   Status is: Inpatient  Remains inpatient appropriate because:Inpatient level of care appropriate due to severity of illness   Dispo: The patient is from: Home              Anticipated d/c is to: TBD              Anticipated d/c date is: > 3 days              Patient currently is not medically stable to d/c.  Barriers to discharge: Hypoxia requiring O2 supplementation/complete 5 days of IV Remdesivir/on IV amiodarone for A. fib with RVR, ongoing GI bleeding necessitating further inpatient monitoring and possible work-up.  Antimicorbials  :    Anti-infectives (From admission, onward)   Start     Dose/Rate Route Frequency Ordered Stop   10/24/20 1000  remdesivir 100 mg in sodium chloride 0.9 % 100 mL IVPB       "Followed by" Linked Group Details   100 mg 200 mL/hr over 30  Minutes Intravenous Daily 10/23/20 1509 10/28/20 0959   10/23/20 1600  remdesivir 200 mg in sodium chloride 0.9% 250 mL  IVPB       "Followed by" Linked Group Details   200 mg 580 mL/hr over 30 Minutes Intravenous Once 10/23/20 1509 10/23/20 1922   10/23/20 1215  cefTRIAXone (ROCEPHIN) 1 g in sodium chloride 0.9 % 100 mL IVPB        1 g 200 mL/hr over 30 Minutes Intravenous  Once 10/23/20 1206 10/23/20 1307   10/23/20 1215  azithromycin (ZITHROMAX) 500 mg in sodium chloride 0.9 % 250 mL IVPB        500 mg 250 mL/hr over 60 Minutes Intravenous  Once 10/23/20 1206 10/23/20 1341      Inpatient Medications  Scheduled Meds: . albuterol  2 puff Inhalation Q6H  . vitamin C  500 mg Oral Daily  . Chlorhexidine Gluconate Cloth  6 each Topical Daily  . cholecalciferol  1,000 Units Oral Daily  . methylPREDNISolone (SOLU-MEDROL) injection  0.5 mg/kg Intravenous Q12H   Followed by  . [START ON 10/26/2020] predniSONE  50 mg Oral Daily  . multivitamin with minerals  1 tablet Oral Daily  . pantoprazole (PROTONIX) IV  40 mg Intravenous Q12H  . sodium chloride flush  3 mL Intravenous Q12H  . vitamin B-12  1,000 mcg Oral Daily  . zinc sulfate  220 mg Oral Daily   Continuous Infusions: . amiodarone 30 mg/hr (10/24/20 0305)  . remdesivir 100 mg in NS 100 mL     PRN Meds:.acetaminophen, albuterol, chlorpheniramine-HYDROcodone, diphenoxylate-atropine, guaiFENesin-dextromethorphan   Time Spent in minutes  35   See all Orders from today for further details   Oren Binet M.D on 10/24/2020 at 6:40 AM  To page go to [email or web address] - use universal password  [facility] -  Office  [phone]    Objective:   Vitals:   10/23/20 1626 10/23/20 2005 10/24/20 0002 10/24/20 0400  BP: (!) 144/77 (!) 124/97 114/72 119/80  Pulse: 100 (!) 114 91 (!) 104  Resp: 18 18 (!) 22 (!) 21  Temp: 98.5 F (36.9 C) 97.7 F (36.5 C) 98.5 F (36.9 C) 97.7 F (36.5 C)  TempSrc: Axillary Oral Axillary  Axillary  SpO2: 97% 97% 96% 96%  Weight:      Height:        Wt Readings from Last 3 Encounters:  10/23/20 62 kg  06/15/20 62.1 kg  01/08/20 63.5 kg     Intake/Output Summary (Last 24 hours) at 10/24/2020 0640 Last data filed at 10/24/2020 0559 Gross per 24 hour  Intake 3589.26 ml  Output 1200 ml  Net 2389.26 ml     Physical Exam Gen Exam:Alert awake-not in any distress HEENT:atraumatic, normocephalic Chest: B/L clear to auscultation anteriorly CVS:S1S2 irregular Abdomen:soft non tender, non distended Extremities:no edema Neurology: Non focal Skin: no rash   Data Review:    CBC Recent Labs  Lab 10/23/20 1129 10/23/20 1640 10/24/20 0341  WBC 9.4  --  11.6*  HGB 9.8* 9.2* 9.3*  HCT 29.3* 28.9* 28.7*  PLT 136*  --  125*  MCV 100.3*  --  97.3  MCH 33.6  --  31.5  MCHC 33.4  --  32.4  RDW 14.3  --  14.5  LYMPHSABS 0.3*  --  0.5*  MONOABS 0.6  --  0.4  EOSABS 0.0  --  0.0  BASOSABS 0.0  --  0.0    Chemistries  Recent Labs  Lab 10/23/20 1129 10/23/20 1307 10/24/20 0341  NA 141  --  140  K 3.7  --  3.9  CL 105  --  107  CO2 26  --  23  GLUCOSE 118*  --  120*  BUN 15  --  15  CREATININE 1.12  --  0.93  CALCIUM 9.1  --  8.3*  MG  --  1.6* 2.4  AST 37  --  40  ALT 22  --  25  ALKPHOS 31*  --  31*  BILITOT 1.2  --  1.0   ------------------------------------------------------------------------------------------------------------------ No results for input(s): CHOL, HDL, LDLCALC, TRIG, CHOLHDL, LDLDIRECT in the last 72 hours.  Lab Results  Component Value Date   HGBA1C  06/11/2007    5.7 (NOTE)   The ADA recommends the following therapeutic goals for glycemic   control related to Hgb A1C measurement:   Goal of Therapy:   < 7.0% Hgb A1C   Action Suggested:  > 8.0% Hgb A1C   Ref:  Diabetes Care, 22, Suppl. 1, 1999   ------------------------------------------------------------------------------------------------------------------ No results for  input(s): TSH, T4TOTAL, T3FREE, THYROIDAB in the last 72 hours.  Invalid input(s): FREET3 ------------------------------------------------------------------------------------------------------------------ Recent Labs    10/23/20 1640 10/24/20 0341  VITAMINB12 429  --   FOLATE 32.5  --   FERRITIN 71 85  TIBC 273  --   IRON 10*  --   RETICCTPCT 0.7  --     Coagulation profile Recent Labs  Lab 10/23/20 1129  INR 1.2    Recent Labs    10/23/20 1640 10/24/20 0341  DDIMER 2.06* 1.91*    Cardiac Enzymes No results for input(s): CKMB, TROPONINI, MYOGLOBIN in the last 168 hours.  Invalid input(s): CK ------------------------------------------------------------------------------------------------------------------    Component Value Date/Time   BNP 541.6 (H) 10/23/2020 1640   BNP 379.4 (H) 02/25/2016 0951    Micro Results Recent Results (from the past 240 hour(s))  Blood culture (routine x 2)     Status: None (Preliminary result)   Collection Time: 10/23/20 11:29 AM   Specimen: BLOOD  Result Value Ref Range Status   Specimen Description BLOOD RIGHT ANTECUBITAL  Final   Special Requests   Final    BOTTLES DRAWN AEROBIC AND ANAEROBIC Blood Culture adequate volume   Culture   Final    NO GROWTH < 12 HOURS Performed at [facility], [address] [place], [place] [address]    Report Status PENDING  Incomplete  Blood culture (routine x 2)     Status: None (Preliminary result)   Collection Time: 10/23/20 11:30 AM   Specimen: BLOOD  Result Value Ref Range Status   Specimen Description BLOOD LEFT ANTECUBITAL  Final   Special Requests   Final    BOTTLES DRAWN AEROBIC AND ANAEROBIC Blood Culture adequate volume   Culture   Final    NO GROWTH < 12 HOURS Performed at [facility], [place] [address] [place], [place] [address]    Report Status PENDING  Incomplete  Resp Panel by RT-PCR (Flu A&B, Covid) Nasopharyngeal Swab     Status: Abnormal   Collection Time:  10/23/20 12:02 PM   Specimen: Nasopharyngeal Swab; Nasopharyngeal(NP) swabs in vial transport medium  Result Value Ref Range Status   SARS Coronavirus 2 by RT PCR POSITIVE (A) NEGATIVE Final    Comment: emailed L. Berdik RN 14:25 10/23/20 ([email or web address]) (NOTE) SARS-CoV-2 target nucleic acids are DETECTED.  The SARS-CoV-2 RNA is generally detectable in upper respiratory specimens during the acute phase of infection. Positive results are indicative of the presence of the identified virus, but do not rule out bacterial infection or co-infection  with other pathogens not detected by the test. Clinical correlation with patient history and other diagnostic information is necessary to determine patient infection status. The expected result is Negative.  Fact Sheet for Patients: EntrepreneurPulse.com.au  Fact Sheet for Healthcare Providers: IncredibleEmployment.be  This test is not yet approved or cleared by the [place] FDA and  has been authorized for detection and/or diagnosis of SARS-CoV-2 by FDA under an Emergency Use Authorization (EUA).  This EUA will remain in effect (meaning this test can be used) for the duration of  the COVID- 19 declaration under Section 564(b)(1) of the Act, 21 U.S.C. section 360bbb-3(b)(1), unless the authorization is terminated or revoked sooner.     Influenza A by PCR NEGATIVE NEGATIVE Final   Influenza B by PCR NEGATIVE NEGATIVE Final    Comment: (NOTE) The Xpert Xpress SARS-CoV-2/FLU/RSV plus assay is intended as an aid in the diagnosis of influenza from Nasopharyngeal swab specimens and should not be used as a sole basis for treatment. Nasal washings and aspirates are unacceptable for Xpert Xpress SARS-CoV-2/FLU/RSV testing.  Fact Sheet for Patients: EntrepreneurPulse.com.au  Fact Sheet for Healthcare Providers: IncredibleEmployment.be  This test is not yet approved or  cleared by the [place] FDA and has been authorized for detection and/or diagnosis of SARS-CoV-2 by FDA under an Emergency Use Authorization (EUA). This EUA will remain in effect (meaning this test can be used) for the duration of the COVID-19 declaration under Section 564(b)(1) of the Act, 21 U.S.C. section 360bbb-3(b)(1), unless the authorization is terminated or revoked.  Performed at [facility], [place] [address], [place], [place] [address]     Radiology Reports DG Chest [place] 1 View  Result Date: 10/23/2020 CLINICAL DATA:  Questionable sepsis. EXAM: PORTABLE CHEST 1 VIEW COMPARISON:  October 14, 2013. FINDINGS: Similar mildly enlarged cardiac silhouette with median sternotomy. Pulmonary vascular congestion. Left subclavian approach cardiac rhythm maintenance device. Aortic atherosclerosis. Streaky left basilar opacities. No confluent consolidation. No visible pleural effusions or pneumothorax. No acute osseous abnormality. IMPRESSION: 1. Low lung volumes with streaky left basilar opacities, which may represent atelectasis, aspiration, and/or pneumonia. No confluent consolidation. 2. Mild cardiomegaly and pulmonary vascular congestion. Electronically Signed   By: Margaretha Sheffield MD   On: 10/23/2020 11:54

## 2020-10-25 ENCOUNTER — Inpatient Hospital Stay (HOSPITAL_COMMUNITY): Payer: Medicare HMO

## 2020-10-25 DIAGNOSIS — I361 Nonrheumatic tricuspid (valve) insufficiency: Secondary | ICD-10-CM

## 2020-10-25 DIAGNOSIS — I4891 Unspecified atrial fibrillation: Secondary | ICD-10-CM

## 2020-10-25 DIAGNOSIS — I371 Nonrheumatic pulmonary valve insufficiency: Secondary | ICD-10-CM

## 2020-10-25 DIAGNOSIS — D539 Nutritional anemia, unspecified: Secondary | ICD-10-CM

## 2020-10-25 LAB — CBC
HCT: 29.6 % — ABNORMAL LOW (ref 39.0–52.0)
HCT: 30.9 % — ABNORMAL LOW (ref 39.0–52.0)
Hemoglobin: 9.7 g/dL — ABNORMAL LOW (ref 13.0–17.0)
Hemoglobin: 10.1 g/dL — ABNORMAL LOW (ref 13.0–17.0)
MCH: 31.6 pg (ref 26.0–34.0)
MCH: 31.7 pg (ref 26.0–34.0)
MCHC: 32.8 g/dL (ref 30.0–36.0)
MCHC: 32.7 g/dL (ref 30.0–36.0)
MCV: 96.4 fL (ref 80.0–100.0)
MCV: 96.9 fL (ref 80.0–100.0)
Platelets: 148 10*3/uL — ABNORMAL LOW (ref 150–400)
Platelets: 151 10*3/uL (ref 150–400)
RBC: 3.07 MIL/uL — ABNORMAL LOW (ref 4.22–5.81)
RBC: 3.19 MIL/uL — ABNORMAL LOW (ref 4.22–5.81)
RDW: 14.5 % (ref 11.5–15.5)
RDW: 14.3 % (ref 11.5–15.5)
WBC: 14.8 10*3/uL — ABNORMAL HIGH (ref 4.0–10.5)
WBC: 12.7 10*3/uL — ABNORMAL HIGH (ref 4.0–10.5)
nRBC: 0 % (ref 0.0–0.2)
nRBC: 0 % (ref 0.0–0.2)

## 2020-10-25 LAB — CBC WITH DIFFERENTIAL/PLATELET
Abs Immature Granulocytes: 0.06 10*3/uL (ref 0.00–0.07)
Basophils Absolute: 0 10*3/uL (ref 0.0–0.1)
Basophils Relative: 0 %
Eosinophils Absolute: 0 10*3/uL (ref 0.0–0.5)
Eosinophils Relative: 0 %
HCT: 29.2 % — ABNORMAL LOW (ref 39.0–52.0)
Hemoglobin: 9.6 g/dL — ABNORMAL LOW (ref 13.0–17.0)
Immature Granulocytes: 1 %
Lymphocytes Relative: 4 %
Lymphs Abs: 0.5 10*3/uL — ABNORMAL LOW (ref 0.7–4.0)
MCH: 31.8 pg (ref 26.0–34.0)
MCHC: 32.9 g/dL (ref 30.0–36.0)
MCV: 96.7 fL (ref 80.0–100.0)
Monocytes Absolute: 0.5 10*3/uL (ref 0.1–1.0)
Monocytes Relative: 4 %
Neutro Abs: 11.1 10*3/uL — ABNORMAL HIGH (ref 1.7–7.7)
Neutrophils Relative %: 91 %
Platelets: 137 10*3/uL — ABNORMAL LOW (ref 150–400)
RBC: 3.02 MIL/uL — ABNORMAL LOW (ref 4.22–5.81)
RDW: 14.5 % (ref 11.5–15.5)
WBC: 12.2 10*3/uL — ABNORMAL HIGH (ref 4.0–10.5)
nRBC: 0 % (ref 0.0–0.2)

## 2020-10-25 LAB — ECHOCARDIOGRAM COMPLETE
Height: 69 in
MV M vel: 5.85 m/s
MV Peak grad: 136.9 mmHg
Radius: 0.7 cm
S' Lateral: 4.8 cm
Weight: 2186.96 oz

## 2020-10-25 LAB — URINE CULTURE: Culture: NO GROWTH

## 2020-10-25 LAB — FOLATE RBC
Folate, Hemolysate: 435 ng/mL
Folate, RBC: 1548 ng/mL (ref >498)
Hematocrit: 28.1 % — ABNORMAL LOW (ref 37.5–51.0)

## 2020-10-25 LAB — COMPREHENSIVE METABOLIC PANEL
ALT: 24 U/L (ref 0–44)
AST: 36 U/L (ref 15–41)
Albumin: 2.8 g/dL — ABNORMAL LOW (ref 3.5–5.0)
Alkaline Phosphatase: 29 U/L — ABNORMAL LOW (ref 38–126)
Anion gap: 10 (ref 5–15)
BUN: 26 mg/dL — ABNORMAL HIGH (ref 8–23)
CO2: 22 mmol/L (ref 22–32)
Calcium: 8.6 mg/dL — ABNORMAL LOW (ref 8.9–10.3)
Chloride: 104 mmol/L (ref 98–111)
Creatinine, Ser: 1.15 mg/dL (ref 0.61–1.24)
GFR, Estimated: >60 mL/min (ref >60)
Glucose, Bld: 156 mg/dL — ABNORMAL HIGH (ref 70–99)
Potassium: 4.8 mmol/L (ref 3.5–5.1)
Sodium: 136 mmol/L (ref 135–145)
Total Bilirubin: 0.8 mg/dL (ref 0.3–1.2)
Total Protein: 5.3 g/dL — ABNORMAL LOW (ref 6.5–8.1)

## 2020-10-25 LAB — D-DIMER, QUANTITATIVE: D-Dimer, Quant: 1.51 ug/mL-FEU — ABNORMAL HIGH (ref 0.00–0.50)

## 2020-10-25 LAB — FERRITIN: Ferritin: 94 ng/mL (ref 24–336)

## 2020-10-25 LAB — C-REACTIVE PROTEIN: CRP: 13.6 mg/dL — ABNORMAL HIGH (ref <1.0)

## 2020-10-25 LAB — MAGNESIUM: Magnesium: 2.4 mg/dL (ref 1.7–2.4)

## 2020-10-25 MED ORDER — TAMSULOSIN HCL 0.4 MG PO CAPS
0.4000 mg | ORAL_CAPSULE | Freq: Every day | ORAL | Status: DC
  Administered 2020-10-25 – 2020-11-04 (×11): 0.4 mg via ORAL
  Filled 2020-10-25 (×11): qty 1

## 2020-10-25 NOTE — Progress Notes (Signed)
[facility] ROUNDING NOTE   Subjective: No BM for 24+ hours.  H/H stable.  Denies any complaints at this time.   Objective: Vital signs in last 24 hours: Temp:  [97.8 F (36.6 C)-98.3 F (36.8 C)] 98 F (36.7 C) (11/28 0731) Pulse Rate:  [95-109] 100 (11/28 0731) Resp:  [17-23] 19 (11/28 0731) BP: (114-127)/(82-93) 121/93 (11/28 0731) SpO2:  [92 %-100 %] 96 % (11/28 0731) Last BM Date:  (UTA) General: Well conversive, speaks in full sentences, unlabored breathing    Intake/Output from previous day: 11/27 0701 - 11/28 0700 In: 854.1 [P.O.:360; I.V.:394.1; IV Piggyback:100] Out: 375 [Urine:375] Intake/Output this shift: No intake/output data recorded.   Lab Results: Recent Labs    10/24/20 1824 10/25/20 0223 10/25/20 0941  WBC 17.6* 12.2* 14.8*  HGB 10.4* 9.6* 9.7*  PLT 158 137* 148*  MCV 100.0 96.7 96.4   BMET Recent Labs    10/23/20 1129 10/24/20 0341 10/25/20 0223  NA 141 140 136  K 3.7 3.9 4.8  CL 105 107 104  CO2 26 23 22   GLUCOSE 118* 120* 156*  BUN 15 15 26*  CREATININE 1.12 0.93 1.15  CALCIUM 9.1 8.3* 8.6*   LFT Recent Labs    10/23/20 1129 10/24/20 0341 10/25/20 0223  PROT 5.9* 5.3* 5.3*  ALBUMIN 3.3* 2.8* 2.8*  AST 37 40 36  ALT 22 25 24   ALKPHOS 31* 31* 29*  BILITOT 1.2 1.0 0.8   PT/INR Recent Labs    10/23/20 1129  INR 1.2      Imaging/Other results: DG Chest Port 1 View  Result Date: 10/23/2020 CLINICAL DATA:  Questionable sepsis. EXAM: PORTABLE CHEST 1 VIEW COMPARISON:  October 14, 2013. FINDINGS: Similar mildly enlarged cardiac silhouette with median sternotomy. Pulmonary vascular congestion. Left subclavian approach cardiac rhythm maintenance device. Aortic atherosclerosis. Streaky left basilar opacities. No confluent consolidation. No visible pleural effusions or pneumothorax. No acute osseous abnormality. IMPRESSION: 1. Low lung volumes with streaky left basilar opacities, which may represent atelectasis,  aspiration, and/or pneumonia. No confluent consolidation. 2. Mild cardiomegaly and pulmonary vascular congestion. Electronically Signed   By: Margaretha Sheffield MD   On: 10/23/2020 11:54   DG Chest Port 1V same Day  Result Date: 10/24/2020 CLINICAL DATA:  Reported COVID-19 positive EXAM: PORTABLE CHEST 1 VIEW COMPARISON:  October 23, 2020 FINDINGS: There is ill-defined opacity in the right base and left mid lung regions. Left midlung ill-defined opacity is partly obscured by pacemaker. There is mild left base atelectasis. No consolidation. Heart is upper normal in size with pulmonary vascularity normal. Pacemaker leads are attached to the right atrium and right ventricle. Patient is status post coronary artery bypass grafting. There is aortic atherosclerosis. No adenopathy no evident bone lesions. IMPRESSION: Areas of airspace opacity in the right base and left mid lung regions which potentially may represent atypical organism pneumonia, particularly given the clinical history. Mild left base atelectasis. No consolidation. Heart upper normal in size, stable. Pacemaker leads attached to right atrium and right ventricle. Status post coronary artery bypass grafting. Aortic Atherosclerosis (ICD10-I70.0). Electronically Signed   By: Lowella Grip III M.D.   On: 10/24/2020 09:53      Assessment and Plan:  Discussed his care with the patient along with his nursing staff.  H/H has remained stable and without any overt bleeding.  Can defer any GI work-up at this time in favor of treatment of more acute issues.  GI service will sign off.  Please do not hesitate to  contact on-call GI with future questions or concerns.    Lavena Bullion, DO  10/25/2020, 10:39 AM [facility] Pager [phone]

## 2020-10-25 NOTE — Progress Notes (Signed)
  Echocardiogram 2D Echocardiogram has been performed.  Merrie Roof F 10/25/2020, 3:21 PM

## 2020-10-25 NOTE — Progress Notes (Signed)
PROGRESS NOTE                                                                                                                                                                                                             Patient Demographics:    [name] [name], is a 86 y.o. male, DOB - 1932-03-18, HRC:[id number]  Outpatient Primary MD for the patient is Crist Infante, MD   Admit date - 10/23/2020   LOS - 2  Chief Complaint  Patient presents with  . Weakness       Brief Narrative: Patient is a 86 y.o. male with PMHx of CAD s/p CABG in 2002, s/p PPM implantation, chronic systolic heart failure, chronic atrial fibrillation-not on anticoagulation (history of recurrent GI bleeding), history of colon cancer, history of recurrent GI bleeding-who presented with a 3-4-day history of weakness, dark appearing stools.  In the emergency room-he was found to be febrile.  While in the ED-he had an episode of wide-complex tachycardia associated with hypotension-required emergent cardioversion.  He was also found to be COVID-19 positive and acute urinary retention.  He was subsequently admitted to the hospitalist service.  See below for further details.  COVID-19 vaccinated status: Vaccinated (but not a booster)  Significant Events: 11/26>> Admit to [facility] for generalized weakness, fever, and dark-colored stools-COVID-19 positive.  Had episode of wide-complex tachycardia with hypotension requiring emergent cardioversion. 11/26>> acute urinary retention-difficult Foley catheter -18 French coud catheter placed by urology.  Significant studies: 11/26>>Chest x-ray: Streaky left basilar opacities, mild cardiomegaly and pulmonary vascular congestion. 11/27>> chest x-ray: Bilateral patchy opacities.  COVID-19 medications: Steroids: 11/26>> Remdesivir: 11/26>>  Antibiotics: None  Microbiology data: 11/26 >>blood culture: No  growth  Procedures: 11/26>> cardioversion by ED MD for wide-complex tachycardia (presumed V. tach) associated with hypotension.  Consults: Cardiology, GI, urology  DVT prophylaxis: SCDs Start: 10/23/20 1506   Subjective:   Lying comfortably in bed-heart rate in the 90s this morning-awake/alert. Not on any oxygen.   Assessment  & Plan :   Acute Hypoxic Resp Failure due to Covid 19 Viral pneumonia: Improved-on room air this morning-on steroid/Remdesivir. Follow closely.   Fever: afebrile O2 requirements:  SpO2: 96 % O2 Flow Rate (L/min): 3 L/min   COVID-19 Labs: Recent Labs    10/23/20 1640 10/24/20 0341 10/25/20 0223  DDIMER 2.06* 1.91* 1.51*  FERRITIN  71 85 94  LDH 201*  --   --   CRP 7.5* 13.5* 13.6*       Component Value Date/Time   BNP 541.6 (H) 10/23/2020 1640   BNP 379.4 (H) 02/25/2016 0951    Recent Labs  Lab 10/23/20 1640  PROCALCITON 0.63    Lab Results  Component Value Date   SARSCOV2NAA POSITIVE (A) 10/23/2020     Prone/Incentive Spirometry: encouraged  incentive spirometry use 3-4/hour.  Sepsis: Likely secondary to COVID-19 infection-sepsis physiology has resolved. Cultures negative so far.  Wide-complex tachycardia: Reviewed ED/cardiology notes-concern for V. tach-cardioverted by EDMD on admission on 11/26.  On amiodarone infusion currently-cardiology following-defer further to cardiology.  Chronic atrial fibrillation with RVR: Continue telemetry monitoring-given history of recurrent GI bleeding-not anticoagulation candidate.  Remains on amiodarone infusion-await further input from cardiology.  Chronic systolic heart failure: Euvolemic-await updated echocardiogram.  GI bleeding: No BM in the past 24 hours per patient-hemoglobin stable-continue PPI. GI following. Will defer further to GI.  Acute urinary retention: S/p coud catheter insertion by urology on 11/26-recommendations-continue Flomax-and keep catheter for 1 week prior to initiating  a voiding trial.  Deconditioning: Secondary to acute illness-superimposed on some amount of chronic debility.  Await PT/OT eval.  RN pressure injury documentation: Pressure Injury 10/23/20 Coccyx (Active)  10/23/20 1600  Location: Coccyx  Location Orientation:   Staging:   Wound Description (Comments):   Present on Admission:     GI prophylaxis: PPI  ABG:    Component Value Date/Time   HCO3 22.8 06/11/2007 1615   TCO2 24 06/11/2007 1615   ACIDBASEDEF 1.0 06/11/2007 1615    Vent Settings: N/A    Condition - Extremely Guarded  Family Communication  : Daughter [name]  [phone]) on 11/28  Code Status :  Full Code  Diet :  Diet Order            Diet clear liquid Room service appropriate? Yes; Fluid consistency: Thin  Diet effective now                  Disposition Plan  :   Status is: Inpatient  Remains inpatient appropriate because:Inpatient level of care appropriate due to severity of illness   Dispo: The patient is from: Home              Anticipated d/c is to: TBD              Anticipated d/c date is: > 3 days              Patient currently is not medically stable to d/c.  Barriers to discharge: Hypoxia requiring O2 supplementation/complete 5 days of IV Remdesivir/on IV amiodarone for A. fib with RVR, ongoing GI bleeding necessitating further inpatient monitoring and possible work-up.  Antimicorbials  :    Anti-infectives (From admission, onward)   Start     Dose/Rate Route Frequency Ordered Stop   10/24/20 1000  remdesivir 100 mg in sodium chloride 0.9 % 100 mL IVPB       "Followed by" Linked Group Details   100 mg 200 mL/hr over 30 Minutes Intravenous Daily 10/23/20 1509 10/28/20 0959   10/23/20 1600  remdesivir 200 mg in sodium chloride 0.9% 250 mL IVPB       "Followed by" Linked Group Details   200 mg 580 mL/hr over 30 Minutes Intravenous Once 10/23/20 1509 10/23/20 1922   10/23/20 1215  cefTRIAXone (ROCEPHIN) 1 g in sodium chloride 0.9 % 100  mL IVPB  1 g 200 mL/hr over 30 Minutes Intravenous  Once 10/23/20 1206 10/23/20 1307   10/23/20 1215  azithromycin (ZITHROMAX) 500 mg in sodium chloride 0.9 % 250 mL IVPB        500 mg 250 mL/hr over 60 Minutes Intravenous  Once 10/23/20 1206 10/23/20 1341      Inpatient Medications  Scheduled Meds: . albuterol  2 puff Inhalation Q6H  . vitamin C  500 mg Oral Daily  . Chlorhexidine Gluconate Cloth  6 each Topical Daily  . cholecalciferol  1,000 Units Oral Daily  . methylPREDNISolone (SOLU-MEDROL) injection  0.5 mg/kg Intravenous Q12H   Followed by  . [START ON 10/26/2020] predniSONE  50 mg Oral Daily  . multivitamin with minerals  1 tablet Oral Daily  . pantoprazole (PROTONIX) IV  40 mg Intravenous Q12H  . sodium chloride flush  3 mL Intravenous Q12H  . vitamin B-12  1,000 mcg Oral Daily  . zinc sulfate  220 mg Oral Daily   Continuous Infusions: . amiodarone 30 mg/hr (10/25/20 0740)  . remdesivir 100 mg in NS 100 mL 100 mg (10/25/20 0907)   PRN Meds:.acetaminophen, albuterol, chlorpheniramine-HYDROcodone, diphenoxylate-atropine, guaiFENesin-dextromethorphan   Time Spent in minutes  35   See all Orders from today for further details   Oren Binet M.D on 10/25/2020 at 9:45 AM  To page go to [email or web address] - use universal password  [facility] -  Office  [phone]    Objective:   Vitals:   10/24/20 2342 10/25/20 0200 10/25/20 0341 10/25/20 0731  BP: (!) 127/93  120/82 (!) 121/93  Pulse: 100  100 100  Resp: 19 20 17 19   Temp: 98.3 F (36.8 C)  98.2 F (36.8 C) 98 F (36.7 C)  TempSrc: Axillary  Axillary Oral  SpO2: 92% 98% 97% 96%  Weight:      Height:        Wt Readings from Last 3 Encounters:  10/23/20 62 kg  06/15/20 62.1 kg  01/08/20 63.5 kg     Intake/Output Summary (Last 24 hours) at 10/25/2020 0945 Last data filed at 10/25/2020 0539 Gross per 24 hour  Intake 614.12 ml  Output 375 ml  Net 239.12 ml     Physical Exam Gen  Exam:Alert awake-not in any distress HEENT:atraumatic, normocephalic Chest: B/L clear to auscultation anteriorly CVS:S1S2 irregular Abdomen:soft non tender, non distended Extremities:no edema Neurology: Non focal Skin: no rash   Data Review:    CBC Recent Labs  Lab 10/23/20 1129 10/23/20 1129 10/23/20 1640 10/24/20 0341 10/24/20 1046 10/24/20 1824 10/25/20 0223  WBC 9.4  --   --  11.6* 13.2* 17.6* 12.2*  HGB 9.8*   < > 9.2* 9.3* 9.6* 10.4* 9.6*  HCT 29.3*   < > 28.9* 28.7* 29.9* 32.9* 29.2*  PLT 136*  --   --  125* 123* 158 137*  MCV 100.3*  --   --  97.3 97.7 100.0 96.7  MCH 33.6  --   --  31.5 31.4 31.6 31.8  MCHC 33.4  --   --  32.4 32.1 31.6 32.9  RDW 14.3  --   --  14.5 14.5 14.5 14.5  LYMPHSABS 0.3*  --   --  0.5*  --   --  0.5*  MONOABS 0.6  --   --  0.4  --   --  0.5  EOSABS 0.0  --   --  0.0  --   --  0.0  BASOSABS 0.0  --   --  0.0  --   --  0.0   < > = values in this interval not displayed.    Chemistries  Recent Labs  Lab 10/23/20 1129 10/23/20 1307 10/24/20 0341 10/25/20 0223  NA 141  --  140 136  K 3.7  --  3.9 4.8  CL 105  --  107 104  CO2 26  --  23 22  GLUCOSE 118*  --  120* 156*  BUN 15  --  15 26*  CREATININE 1.12  --  0.93 1.15  CALCIUM 9.1  --  8.3* 8.6*  MG  --  1.6* 2.4 2.4  AST 37  --  40 36  ALT 22  --  25 24  ALKPHOS 31*  --  31* 29*  BILITOT 1.2  --  1.0 0.8   ------------------------------------------------------------------------------------------------------------------ No results for input(s): CHOL, HDL, LDLCALC, TRIG, CHOLHDL, LDLDIRECT in the last 72 hours.  Lab Results  Component Value Date   HGBA1C  06/11/2007    5.7 (NOTE)   The ADA recommends the following therapeutic goals for glycemic   control related to Hgb A1C measurement:   Goal of Therapy:   < 7.0% Hgb A1C   Action Suggested:  > 8.0% Hgb A1C   Ref:  Diabetes Care, 22, Suppl. 1, 1999    ------------------------------------------------------------------------------------------------------------------ No results for input(s): TSH, T4TOTAL, T3FREE, THYROIDAB in the last 72 hours.  Invalid input(s): FREET3 ------------------------------------------------------------------------------------------------------------------ Recent Labs    10/23/20 1640 10/23/20 1640 10/24/20 0341 10/25/20 0223  VITAMINB12 429  --   --   --   FOLATE 32.5  --   --   --   FERRITIN 71   < > 85 94  TIBC 273  --   --   --   IRON 10*  --   --   --   RETICCTPCT 0.7  --   --   --    < > = values in this interval not displayed.    Coagulation profile Recent Labs  Lab 10/23/20 1129  INR 1.2    Recent Labs    10/24/20 0341 10/25/20 0223  DDIMER 1.91* 1.51*    Cardiac Enzymes No results for input(s): CKMB, TROPONINI, MYOGLOBIN in the last 168 hours.  Invalid input(s): CK ------------------------------------------------------------------------------------------------------------------    Component Value Date/Time   BNP 541.6 (H) 10/23/2020 1640   BNP 379.4 (H) 02/25/2016 0951    Micro Results Recent Results (from the past 240 hour(s))  Urine culture     Status: None   Collection Time: 10/23/20 11:19 AM   Specimen: In/Out Cath Urine  Result Value Ref Range Status   Specimen Description IN/OUT CATH URINE  Final   Special Requests NONE  Final   Culture   Final    NO GROWTH Performed at [facility], [place] [address] [place], [place] [address]    Report Status 10/25/2020 FINAL  Final  Blood culture (routine x 2)     Status: None (Preliminary result)   Collection Time: 10/23/20 11:29 AM   Specimen: BLOOD  Result Value Ref Range Status   Specimen Description BLOOD RIGHT ANTECUBITAL  Final   Special Requests   Final    BOTTLES DRAWN AEROBIC AND ANAEROBIC Blood Culture adequate volume   Culture   Final    NO GROWTH 2 DAYS Performed at [facility], [place] [address], [place], [place] [address]    Report Status PENDING  Incomplete  Blood culture (routine x 2)  Status: None (Preliminary result)   Collection Time: 10/23/20 11:30 AM   Specimen: BLOOD  Result Value Ref Range Status   Specimen Description BLOOD LEFT ANTECUBITAL  Final   Special Requests   Final    BOTTLES DRAWN AEROBIC AND ANAEROBIC Blood Culture adequate volume   Culture   Final    NO GROWTH 2 DAYS Performed at [facility], [address] [place], [place] [address]    Report Status PENDING  Incomplete  Resp Panel by RT-PCR (Flu A&B, Covid) Nasopharyngeal Swab     Status: Abnormal   Collection Time: 10/23/20 12:02 PM   Specimen: Nasopharyngeal Swab; Nasopharyngeal(NP) swabs in vial transport medium  Result Value Ref Range Status   SARS Coronavirus 2 by RT PCR POSITIVE (A) NEGATIVE Final    Comment: emailed L. Berdik RN 14:25 10/23/20 ([email or web address]) (NOTE) SARS-CoV-2 target nucleic acids are DETECTED.  The SARS-CoV-2 RNA is generally detectable in upper respiratory specimens during the acute phase of infection. Positive results are indicative of the presence of the identified virus, but do not rule out bacterial infection or co-infection with other pathogens not detected by the test. Clinical correlation with patient history and other diagnostic information is necessary to determine patient infection status. The expected result is Negative.  Fact Sheet for Patients: EntrepreneurPulse.com.au  Fact Sheet for Healthcare Providers: IncredibleEmployment.be  This test is not yet approved or cleared by the [place] FDA and  has been authorized for detection and/or diagnosis of SARS-CoV-2 by FDA under an Emergency Use Authorization (EUA).  This EUA will remain in effect (meaning this test can be used) for the duration of  the COVID- 19 declaration under Section 564(b)(1) of the Act, 21 U.S.C. section 360bbb-3(b)(1), unless the authorization  is terminated or revoked sooner.     Influenza A by PCR NEGATIVE NEGATIVE Final   Influenza B by PCR NEGATIVE NEGATIVE Final    Comment: (NOTE) The Xpert Xpress SARS-CoV-2/FLU/RSV plus assay is intended as an aid in the diagnosis of influenza from Nasopharyngeal swab specimens and should not be used as a sole basis for treatment. Nasal washings and aspirates are unacceptable for Xpert Xpress SARS-CoV-2/FLU/RSV testing.  Fact Sheet for Patients: EntrepreneurPulse.com.au  Fact Sheet for Healthcare Providers: IncredibleEmployment.be  This test is not yet approved or cleared by the [place] FDA and has been authorized for detection and/or diagnosis of SARS-CoV-2 by FDA under an Emergency Use Authorization (EUA). This EUA will remain in effect (meaning this test can be used) for the duration of the COVID-19 declaration under Section 564(b)(1) of the Act, 21 U.S.C. section 360bbb-3(b)(1), unless the authorization is terminated or revoked.  Performed at [facility], [place] [address], [place],  [address]     Radiology Reports DG Chest [place] 1 View  Result Date: 10/23/2020 CLINICAL DATA:  Questionable sepsis. EXAM: PORTABLE CHEST 1 VIEW COMPARISON:  October 14, 2013. FINDINGS: Similar mildly enlarged cardiac silhouette with median sternotomy. Pulmonary vascular congestion. Left subclavian approach cardiac rhythm maintenance device. Aortic atherosclerosis. Streaky left basilar opacities. No confluent consolidation. No visible pleural effusions or pneumothorax. No acute osseous abnormality. IMPRESSION: 1. Low lung volumes with streaky left basilar opacities, which may represent atelectasis, aspiration, and/or pneumonia. No confluent consolidation. 2. Mild cardiomegaly and pulmonary vascular congestion. Electronically Signed   By: Margaretha Sheffield MD   On: 10/23/2020 11:54   DG Chest [place] 1V same Day  Result Date: 10/24/2020 CLINICAL  DATA:  Reported COVID-19 positive EXAM: PORTABLE CHEST 1  VIEW COMPARISON:  October 23, 2020 FINDINGS: There is ill-defined opacity in the right base and left mid lung regions. Left midlung ill-defined opacity is partly obscured by pacemaker. There is mild left base atelectasis. No consolidation. Heart is upper normal in size with pulmonary vascularity normal. Pacemaker leads are attached to the right atrium and right ventricle. Patient is status post coronary artery bypass grafting. There is aortic atherosclerosis. No adenopathy no evident bone lesions. IMPRESSION: Areas of airspace opacity in the right base and left mid lung regions which potentially may represent atypical organism pneumonia, particularly given the clinical history. Mild left base atelectasis. No consolidation. Heart upper normal in size, stable. Pacemaker leads attached to right atrium and right ventricle. Status post coronary artery bypass grafting. Aortic Atherosclerosis (ICD10-I70.0). Electronically Signed   By: Lowella Grip III M.D.   On: 10/24/2020 09:53

## 2020-10-26 LAB — COMPREHENSIVE METABOLIC PANEL
ALT: 23 U/L (ref 0–44)
AST: 33 U/L (ref 15–41)
Albumin: 2.9 g/dL — ABNORMAL LOW (ref 3.5–5.0)
Alkaline Phosphatase: 30 U/L — ABNORMAL LOW (ref 38–126)
Anion gap: 10 (ref 5–15)
BUN: 24 mg/dL — ABNORMAL HIGH (ref 8–23)
CO2: 22 mmol/L (ref 22–32)
Calcium: 8.5 mg/dL — ABNORMAL LOW (ref 8.9–10.3)
Chloride: 102 mmol/L (ref 98–111)
Creatinine, Ser: 0.99 mg/dL (ref 0.61–1.24)
GFR, Estimated: >60 mL/min (ref >60)
Glucose, Bld: 157 mg/dL — ABNORMAL HIGH (ref 70–99)
Potassium: 4.3 mmol/L (ref 3.5–5.1)
Sodium: 134 mmol/L — ABNORMAL LOW (ref 135–145)
Total Bilirubin: 1 mg/dL (ref 0.3–1.2)
Total Protein: 5.7 g/dL — ABNORMAL LOW (ref 6.5–8.1)

## 2020-10-26 LAB — CBC
HCT: 32.2 % — ABNORMAL LOW (ref 39.0–52.0)
HCT: 33 % — ABNORMAL LOW (ref 39.0–52.0)
Hemoglobin: 10.6 g/dL — ABNORMAL LOW (ref 13.0–17.0)
Hemoglobin: 10.8 g/dL — ABNORMAL LOW (ref 13.0–17.0)
MCH: 31.5 pg (ref 26.0–34.0)
MCH: 31.2 pg (ref 26.0–34.0)
MCHC: 32.9 g/dL (ref 30.0–36.0)
MCHC: 32.7 g/dL (ref 30.0–36.0)
MCV: 95.5 fL (ref 80.0–100.0)
MCV: 95.4 fL (ref 80.0–100.0)
Platelets: 168 10*3/uL (ref 150–400)
Platelets: 175 10*3/uL (ref 150–400)
RBC: 3.37 MIL/uL — ABNORMAL LOW (ref 4.22–5.81)
RBC: 3.46 MIL/uL — ABNORMAL LOW (ref 4.22–5.81)
RDW: 14 % (ref 11.5–15.5)
RDW: 14 % (ref 11.5–15.5)
WBC: 10.8 10*3/uL — ABNORMAL HIGH (ref 4.0–10.5)
WBC: 10.4 10*3/uL (ref 4.0–10.5)
nRBC: 0 % (ref 0.0–0.2)
nRBC: 0 % (ref 0.0–0.2)

## 2020-10-26 LAB — CBC WITH DIFFERENTIAL/PLATELET
Abs Immature Granulocytes: 0.08 10*3/uL — ABNORMAL HIGH (ref 0.00–0.07)
Basophils Absolute: 0 10*3/uL (ref 0.0–0.1)
Basophils Relative: 0 %
Eosinophils Absolute: 0 10*3/uL (ref 0.0–0.5)
Eosinophils Relative: 0 %
HCT: 29.3 % — ABNORMAL LOW (ref 39.0–52.0)
Hemoglobin: 9.5 g/dL — ABNORMAL LOW (ref 13.0–17.0)
Immature Granulocytes: 1 %
Lymphocytes Relative: 3 %
Lymphs Abs: 0.4 10*3/uL — ABNORMAL LOW (ref 0.7–4.0)
MCH: 30.9 pg (ref 26.0–34.0)
MCHC: 32.4 g/dL (ref 30.0–36.0)
MCV: 95.4 fL (ref 80.0–100.0)
Monocytes Absolute: 0.4 10*3/uL (ref 0.1–1.0)
Monocytes Relative: 4 %
Neutro Abs: 11 10*3/uL — ABNORMAL HIGH (ref 1.7–7.7)
Neutrophils Relative %: 92 %
Platelets: 151 10*3/uL (ref 150–400)
RBC: 3.07 MIL/uL — ABNORMAL LOW (ref 4.22–5.81)
RDW: 14.3 % (ref 11.5–15.5)
WBC: 11.9 10*3/uL — ABNORMAL HIGH (ref 4.0–10.5)
nRBC: 0 % (ref 0.0–0.2)

## 2020-10-26 LAB — MAGNESIUM: Magnesium: 2.2 mg/dL (ref 1.7–2.4)

## 2020-10-26 LAB — C-REACTIVE PROTEIN: CRP: 9.9 mg/dL — ABNORMAL HIGH (ref <1.0)

## 2020-10-26 LAB — DIGOXIN LEVEL: Digoxin Level: 0.3 ng/mL — ABNORMAL LOW (ref 1.0–2.0)

## 2020-10-26 LAB — FERRITIN: Ferritin: 105 ng/mL (ref 24–336)

## 2020-10-26 LAB — D-DIMER, QUANTITATIVE: D-Dimer, Quant: 1.25 ug/mL-FEU — ABNORMAL HIGH (ref 0.00–0.50)

## 2020-10-26 MED ORDER — DIGOXIN 125 MCG PO TABS
0.0625 mg | ORAL_TABLET | Freq: Every day | ORAL | Status: DC
  Administered 2020-10-27 – 2020-10-31 (×5): 0.0625 mg via ORAL
  Filled 2020-10-26 (×5): qty 1

## 2020-10-26 MED ORDER — PREDNISONE 20 MG PO TABS
40.0000 mg | ORAL_TABLET | Freq: Every day | ORAL | Status: DC
  Administered 2020-10-26 – 2020-10-27 (×2): 40 mg via ORAL
  Filled 2020-10-26: qty 2

## 2020-10-26 MED ORDER — DIGOXIN 0.25 MG/ML IJ SOLN
0.5000 mg | Freq: Once | INTRAMUSCULAR | Status: AC
  Administered 2020-10-26: 0.5 mg via INTRAVENOUS
  Filled 2020-10-26: qty 2

## 2020-10-26 NOTE — Progress Notes (Signed)
OT Cancellation Note  Patient Details Name: [name] [name] MRN: [id number] DOB: July 24, 1932   Cancelled Treatment:    Reason Eval/Treat Not Completed: Medical issues which prohibited therapy  RN asked to hold at this time as pt undergoing EKG, HR elevated despite intervention, and she is contacting cardiology.  Zenovia Jarred, MSOT, OTR/L Acute Rehabilitation Services [facility] Office Number: [phone] Pager: [phone]  Zenovia Jarred 10/26/2020, 11:30 AM

## 2020-10-26 NOTE — Progress Notes (Signed)
PROGRESS NOTE                                                                                                                                                                                                             Patient Demographics:    [name] [name], is a 86 y.o. male, DOB - Apr 23, 1932, BMW:[id number]  Outpatient Primary MD for the patient is Crist Infante, MD   Admit date - 10/23/2020   LOS - 3  Chief Complaint  Patient presents with  . Weakness       Brief Narrative: Patient is a 86 y.o. male with PMHx of CAD s/p CABG in 2002, s/p PPM implantation, chronic systolic heart failure, chronic atrial fibrillation-not on anticoagulation (history of recurrent GI bleeding), history of colon cancer, history of recurrent GI bleeding-who presented with a 3-4-day history of weakness, dark appearing stools.  In the emergency room-he was found to be febrile.  While in the ED-he had an episode of wide-complex tachycardia associated with hypotension-required emergent cardioversion.  He was also found to be COVID-19 positive and acute urinary retention.  He was subsequently admitted to the hospitalist service.  See below for further details.  COVID-19 vaccinated status: Vaccinated (but not a booster)  Significant Events: 11/26>> Admit to [facility] for generalized weakness, fever, and dark-colored stools-COVID-19 positive.  Had episode of wide-complex tachycardia with hypotension requiring emergent cardioversion. 11/26>> acute urinary retention-difficult Foley catheter -18 French coud catheter placed by urology.  Significant studies: 11/26>>Chest x-ray: Streaky left basilar opacities, mild cardiomegaly and pulmonary vascular congestion. 11/27>> chest x-ray: Bilateral patchy opacities. 11/28>> Echo: EF 25-30%.  RV systolic function moderately reduced.  COVID-19 medications: Steroids: 11/26>> Remdesivir:  11/26>>  Antibiotics: None  Microbiology data: 11/26 >>blood culture: No growth  Procedures: 11/26>> cardioversion by ED MD for wide-complex tachycardia (presumed V. tach) associated with hypotension.  Consults: Cardiology, GI, urology  DVT prophylaxis: SCDs Start: 10/23/20 1506   Subjective:   Somewhat confused-but able to answer some questions appropriately-able to be redirected.   Assessment  & Plan :   Acute Hypoxic Resp Failure due to Covid 19 Viral pneumonia: Improved-on room air this morning-on steroid/Remdesivir. Follow closely.   Fever: afebrile O2 requirements:  SpO2: 99 % O2 Flow Rate (L/min): 3 L/min   COVID-19 Labs: Recent Labs    10/23/20 1640 10/23/20 1640 10/24/20 0341  10/25/20 0223 10/26/20 0247  DDIMER 2.06*   < > 1.91* 1.51* 1.25*  FERRITIN 71   < > 85 94 105  LDH 201*  --   --   --   --   CRP 7.5*   < > 13.5* 13.6* 9.9*   < > = values in this interval not displayed.       Component Value Date/Time   BNP 541.6 (H) 10/23/2020 1640   BNP 379.4 (H) 02/25/2016 0951    Recent Labs  Lab 10/23/20 1640  PROCALCITON 0.63    Lab Results  Component Value Date   SARSCOV2NAA POSITIVE (A) 10/23/2020     Prone/Incentive Spirometry: encouraged  incentive spirometry use 3-4/hour.  Sepsis: Likely secondary to COVID-19 infection-sepsis physiology has resolved. Cultures negative so far.  Wide-complex tachycardia: Reviewed ED/cardiology notes-concern for V. tach-cardioverted by EDMD on admission on 11/26.  On amiodarone infusion currently-cardiology following-defer further to cardiology.  Chronic atrial fibrillation with RVR: Remains in RVR.  Continue telemetry monitoring-given history of recurrent GI bleeding-not anticoagulation candidate.  Remains on amiodarone infusion-cardiology plans to load with digoxin for better rate control.  Chronic systolic heart failure: Euvolemic-  GI bleeding: No overt GI bleeding for the past several days.   Hemoglobin stable-GI signed off.  Continue PPI.  Acute urinary retention: S/p coud catheter insertion by urology on 11/26-recommendations-continue Flomax-and keep catheter for 1 week prior to initiating a voiding trial.  Deconditioning: Secondary to acute illness-superimposed on some amount of chronic debility.  Plans are for either SNF versus home health on discharge.  Delirium: Per patient's daughter-patient does get confused in the hospital.  This morning he is somewhat confused but redirectable.  Steroid dosage been deescalated-maintain delirium precautions.  Goals of care: Discussed with patient yesterday-and then again today-subsequently spoke with patient's daughter-we all agree that patient will not benefit from mechanical intubation/ventilation-given his advanced age/frailty.  Daughter wants to ensure that he continues to get CPR/defibrillation if indicated-but agrees that we should not proceed with intubation/mechanical ventilation.  DNI ordered.  RN pressure injury documentation: Pressure Injury 10/23/20 Coccyx (Active)  10/23/20 1600  Location: Coccyx  Location Orientation:   Staging:   Wound Description (Comments):   Present on Admission:     GI prophylaxis: PPI  ABG:    Component Value Date/Time   HCO3 22.8 06/11/2007 1615   TCO2 24 06/11/2007 1615   ACIDBASEDEF 1.0 06/11/2007 1615    Vent Settings: N/A    Condition - Extremely Guarded  Family Communication  : Daughter [name]  [phone]) on 11/29  Code Status :  Full Code  Diet :  Diet Order            Diet clear liquid Room service appropriate? Yes; Fluid consistency: Thin  Diet effective now                  Disposition Plan  :   Status is: Inpatient  Remains inpatient appropriate because:Inpatient level of care appropriate due to severity of illness   Dispo: The patient is from: Home              Anticipated d/c is to: TBD              Anticipated d/c date is: > 3 days              Patient  currently is not medically stable to d/c.  Barriers to discharge: Hypoxia requiring O2 supplementation/complete 5 days of IV Remdesivir/on IV amiodarone for A.  fib with RVR, ongoing GI bleeding necessitating further inpatient monitoring and possible work-up.  Antimicorbials  :    Anti-infectives (From admission, onward)   Start     Dose/Rate Route Frequency Ordered Stop   10/24/20 1000  remdesivir 100 mg in sodium chloride 0.9 % 100 mL IVPB  Status:  Discontinued       "Followed by" Linked Group Details   100 mg 200 mL/hr over 30 Minutes Intravenous Daily 10/23/20 1509 10/26/20 1111   10/23/20 1600  remdesivir 200 mg in sodium chloride 0.9% 250 mL IVPB       "Followed by" Linked Group Details   200 mg 580 mL/hr over 30 Minutes Intravenous Once 10/23/20 1509 10/23/20 1922   10/23/20 1215  cefTRIAXone (ROCEPHIN) 1 g in sodium chloride 0.9 % 100 mL IVPB        1 g 200 mL/hr over 30 Minutes Intravenous  Once 10/23/20 1206 10/23/20 1307   10/23/20 1215  azithromycin (ZITHROMAX) 500 mg in sodium chloride 0.9 % 250 mL IVPB        500 mg 250 mL/hr over 60 Minutes Intravenous  Once 10/23/20 1206 10/23/20 1341      Inpatient Medications  Scheduled Meds: . albuterol  2 puff Inhalation Q6H  . vitamin C  500 mg Oral Daily  . Chlorhexidine Gluconate Cloth  6 each Topical Daily  . cholecalciferol  1,000 Units Oral Daily  . digoxin  0.5 mg Intravenous Once  . multivitamin with minerals  1 tablet Oral Daily  . pantoprazole (PROTONIX) IV  40 mg Intravenous Q12H  . predniSONE  50 mg Oral Daily  . sodium chloride flush  3 mL Intravenous Q12H  . tamsulosin  0.4 mg Oral Daily  . vitamin B-12  1,000 mcg Oral Daily  . zinc sulfate  220 mg Oral Daily   Continuous Infusions: . amiodarone 30 mg/hr (10/26/20 0051)   PRN Meds:.acetaminophen, albuterol, chlorpheniramine-HYDROcodone, diphenoxylate-atropine, guaiFENesin-dextromethorphan   Time Spent in minutes  35   See all Orders from today for  further details   Oren Binet M.D on 10/26/2020 at 11:36 AM  To page go to [email or web address] - use universal password  [facility] -  Office  [phone]    Objective:   Vitals:   10/26/20 0009 10/26/20 0345 10/26/20 0705 10/26/20 0732  BP: (!) 141/92 125/75  133/78  Pulse: 92 98  98  Resp: 19 20 17 17   Temp: 97.9 F (36.6 C) 98.1 F (36.7 C)  (!) 97.5 F (36.4 C)  TempSrc: Axillary Axillary  Oral  SpO2: 100% 100% 100% 99%  Weight:      Height:        Wt Readings from Last 3 Encounters:  10/23/20 62 kg  06/15/20 62.1 kg  01/08/20 63.5 kg     Intake/Output Summary (Last 24 hours) at 10/26/2020 1136 Last data filed at 10/26/2020 0700 Gross per 24 hour  Intake 240 ml  Output 575 ml  Net -335 ml     Physical Exam Gen Exam: Mildly confused-but not in any distress. HEENT:atraumatic, normocephalic Chest: B/L clear to auscultation anteriorly CVS:S1S2 irregular Abdomen:soft non tender, non distended Extremities:no edema Neurology: Non focal Skin: no rash   Data Review:    CBC Recent Labs  Lab 10/23/20 1129 10/23/20 1640 10/24/20 0341 10/24/20 1046 10/24/20 1824 10/25/20 0223 10/25/20 0941 10/25/20 1836 10/26/20 0247  WBC 9.4  --  11.6*   < > 17.6* 12.2* 14.8* 12.7* 11.9*  HGB 9.8*   < >  9.3*   < > 10.4* 9.6* 9.7* 10.1* 9.5*  HCT 29.3*   < > 28.7*   < > 32.9* 29.2* 29.6* 30.9* 29.3*  PLT 136*  --  125*   < > 158 137* 148* 151 151  MCV 100.3*  --  97.3   < > 100.0 96.7 96.4 96.9 95.4  MCH 33.6  --  31.5   < > 31.6 31.8 31.6 31.7 30.9  MCHC 33.4  --  32.4   < > 31.6 32.9 32.8 32.7 32.4  RDW 14.3  --  14.5   < > 14.5 14.5 14.5 14.3 14.3  LYMPHSABS 0.3*  --  0.5*  --   --  0.5*  --   --  0.4*  MONOABS 0.6  --  0.4  --   --  0.5  --   --  0.4  EOSABS 0.0  --  0.0  --   --  0.0  --   --  0.0  BASOSABS 0.0  --  0.0  --   --  0.0  --   --  0.0   < > = values in this interval not displayed.    Chemistries  Recent Labs  Lab 10/23/20 1129  10/23/20 1307 10/24/20 0341 10/25/20 0223 10/26/20 0247  NA 141  --  140 136 134*  K 3.7  --  3.9 4.8 4.3  CL 105  --  107 104 102  CO2 26  --  23 22 22   GLUCOSE 118*  --  120* 156* 157*  BUN 15  --  15 26* 24*  CREATININE 1.12  --  0.93 1.15 0.99  CALCIUM 9.1  --  8.3* 8.6* 8.5*  MG  --  1.6* 2.4 2.4 2.2  AST 37  --  40 36 33  ALT 22  --  25 24 23   ALKPHOS 31*  --  31* 29* 30*  BILITOT 1.2  --  1.0 0.8 1.0   ------------------------------------------------------------------------------------------------------------------ No results for input(s): CHOL, HDL, LDLCALC, TRIG, CHOLHDL, LDLDIRECT in the last 72 hours.  Lab Results  Component Value Date   HGBA1C  06/11/2007    5.7 (NOTE)   The ADA recommends the following therapeutic goals for glycemic   control related to Hgb A1C measurement:   Goal of Therapy:   < 7.0% Hgb A1C   Action Suggested:  > 8.0% Hgb A1C   Ref:  Diabetes Care, 22, Suppl. 1, 1999   ------------------------------------------------------------------------------------------------------------------ No results for input(s): TSH, T4TOTAL, T3FREE, THYROIDAB in the last 72 hours.  Invalid input(s): FREET3 ------------------------------------------------------------------------------------------------------------------ Recent Labs    10/23/20 1640 10/24/20 0341 10/25/20 0223 10/26/20 0247  VITAMINB12 429  --   --   --   FOLATE 32.5  --   --   --   FERRITIN 71   < > 94 105  TIBC 273  --   --   --   IRON 10*  --   --   --   RETICCTPCT 0.7  --   --   --    < > = values in this interval not displayed.    Coagulation profile Recent Labs  Lab 10/23/20 1129  INR 1.2    Recent Labs    10/25/20 0223 10/26/20 0247  DDIMER 1.51* 1.25*    Cardiac Enzymes No results for input(s): CKMB, TROPONINI, MYOGLOBIN in the last 168 hours.  Invalid input(s):  CK ------------------------------------------------------------------------------------------------------------------    Component Value Date/Time   BNP 541.6 (  H) 10/23/2020 1640   BNP 379.4 (H) 02/25/2016 0951    Micro Results Recent Results (from the past 240 hour(s))  Urine culture     Status: None   Collection Time: 10/23/20 11:19 AM   Specimen: In/Out Cath Urine  Result Value Ref Range Status   Specimen Description IN/OUT CATH URINE  Final   Special Requests NONE  Final   Culture   Final    NO GROWTH Performed at [facility], [place] [address], [place], [place] [address]    Report Status 10/25/2020 FINAL  Final  Blood culture (routine x 2)     Status: None (Preliminary result)   Collection Time: 10/23/20 11:29 AM   Specimen: BLOOD  Result Value Ref Range Status   Specimen Description BLOOD RIGHT ANTECUBITAL  Final   Special Requests   Final    BOTTLES DRAWN AEROBIC AND ANAEROBIC Blood Culture adequate volume   Culture   Final    NO GROWTH 2 DAYS Performed at [facility], [place] [address], [place], [place] [address]    Report Status PENDING  Incomplete  Blood culture (routine x 2)     Status: None (Preliminary result)   Collection Time: 10/23/20 11:30 AM   Specimen: BLOOD  Result Value Ref Range Status   Specimen Description BLOOD LEFT ANTECUBITAL  Final   Special Requests   Final    BOTTLES DRAWN AEROBIC AND ANAEROBIC Blood Culture adequate volume   Culture   Final    NO GROWTH 2 DAYS Performed at [facility], [place] [address] [place], [place] [address]    Report Status PENDING  Incomplete  Resp Panel by RT-PCR (Flu A&B, Covid) Nasopharyngeal Swab     Status: Abnormal   Collection Time: 10/23/20 12:02 PM   Specimen: Nasopharyngeal Swab; Nasopharyngeal(NP) swabs in vial transport medium  Result Value Ref Range Status   SARS Coronavirus 2 by RT PCR POSITIVE (A) NEGATIVE Final    Comment: emailed L. Berdik RN 14:25 10/23/20  ([email or web address]) (NOTE) SARS-CoV-2 target nucleic acids are DETECTED.  The SARS-CoV-2 RNA is generally detectable in upper respiratory specimens during the acute phase of infection. Positive results are indicative of the presence of the identified virus, but do not rule out bacterial infection or co-infection with other pathogens not detected by the test. Clinical correlation with patient history and other diagnostic information is necessary to determine patient infection status. The expected result is Negative.  Fact Sheet for Patients: EntrepreneurPulse.com.au  Fact Sheet for Healthcare Providers: IncredibleEmployment.be  This test is not yet approved or cleared by the [place] FDA and  has been authorized for detection and/or diagnosis of SARS-CoV-2 by FDA under an Emergency Use Authorization (EUA).  This EUA will remain in effect (meaning this test can be used) for the duration of  the COVID- 19 declaration under Section 564(b)(1) of the Act, 21 U.S.C. section 360bbb-3(b)(1), unless the authorization is terminated or revoked sooner.     Influenza A by PCR NEGATIVE NEGATIVE Final   Influenza B by PCR NEGATIVE NEGATIVE Final    Comment: (NOTE) The Xpert Xpress SARS-CoV-2/FLU/RSV plus assay is intended as an aid in the diagnosis of influenza from Nasopharyngeal swab specimens and should not be used as a sole basis for treatment. Nasal washings and aspirates are unacceptable for Xpert Xpress SARS-CoV-2/FLU/RSV testing.  Fact Sheet for Patients: EntrepreneurPulse.com.au  Fact Sheet for Healthcare Providers: IncredibleEmployment.be  This test is not yet approved or cleared by the [place]  States FDA and has been authorized for detection and/or diagnosis of SARS-CoV-2 by FDA under an Emergency Use Authorization (EUA). This EUA will remain in effect (meaning this test can be used) for the duration of  the COVID-19 declaration under Section 564(b)(1) of the Act, 21 U.S.C. section 360bbb-3(b)(1), unless the authorization is terminated or revoked.  Performed at [facility], [place] [address], [place], [place] [address]     Radiology Reports DG Chest [place] 1 View  Result Date: 10/23/2020 CLINICAL DATA:  Questionable sepsis. EXAM: PORTABLE CHEST 1 VIEW COMPARISON:  October 14, 2013. FINDINGS: Similar mildly enlarged cardiac silhouette with median sternotomy. Pulmonary vascular congestion. Left subclavian approach cardiac rhythm maintenance device. Aortic atherosclerosis. Streaky left basilar opacities. No confluent consolidation. No visible pleural effusions or pneumothorax. No acute osseous abnormality. IMPRESSION: 1. Low lung volumes with streaky left basilar opacities, which may represent atelectasis, aspiration, and/or pneumonia. No confluent consolidation. 2. Mild cardiomegaly and pulmonary vascular congestion. Electronically Signed   By: Margaretha Sheffield MD   On: 10/23/2020 11:54   DG Chest Port 1V same Day  Result Date: 10/24/2020 CLINICAL DATA:  Reported COVID-19 positive EXAM: PORTABLE CHEST 1 VIEW COMPARISON:  October 23, 2020 FINDINGS: There is ill-defined opacity in the right base and left mid lung regions. Left midlung ill-defined opacity is partly obscured by pacemaker. There is mild left base atelectasis. No consolidation. Heart is upper normal in size with pulmonary vascularity normal. Pacemaker leads are attached to the right atrium and right ventricle. Patient is status post coronary artery bypass grafting. There is aortic atherosclerosis. No adenopathy no evident bone lesions. IMPRESSION: Areas of airspace opacity in the right base and left mid lung regions which potentially may represent atypical organism pneumonia, particularly given the clinical history. Mild left base atelectasis. No consolidation. Heart upper normal in size, stable. Pacemaker leads attached to right  atrium and right ventricle. Status post coronary artery bypass grafting. Aortic Atherosclerosis (ICD10-I70.0). Electronically Signed   By: Lowella Grip III M.D.   On: 10/24/2020 09:53   ECHOCARDIOGRAM COMPLETE  Result Date: 10/25/2020    ECHOCARDIOGRAM REPORT   Patient Name:   [name] [name] Date of Exam: 10/25/2020 Medical Rec #:  [id number]      Height:       69.0 in Accession #:    [id number]     Weight:       136.7 lb Date of Birth:  Sep 30, 1932      BSA:          1.757 m Patient Age:    50 years       BP:           101/83 mmHg Patient Gender: M              HR:           100 bpm. Exam Location:  Inpatient Procedure: 2D Echo, Cardiac Doppler and Color Doppler Indications:    I48.91* Unspeicified atrial fibrillation  History:        Patient has prior history of Echocardiogram examinations, most                 recent 01/04/2016. Prior CABG. Covid19 positive.  Sonographer:    Merrie Roof RDCS Referring Phys: [place]  1. Left ventricular ejection fraction, by estimation, is 25 to 30%. The left ventricle has severely decreased function. The left ventricle demonstrates global hypokinesis. Left ventricular diastolic function could not be evaluated. There is  severe akinesis of the left ventricular, basal-mid inferior and inferolateral walls. Incoordinate (paced) septal motion.  2. Right ventricular systolic function is moderately reduced. The right ventricular size is mildly enlarged. There is severely elevated pulmonary artery systolic pressure. The estimated right ventricular systolic pressure is 30.0 mmHg.  3. Left atrial size was massively dilated.  4. Right atrial size was moderately dilated.  5. Mild mitral subvalvular calcification.  6. Mild mitral subvalvular thickening/fibrosis.  7. Tethering of the mitral valve posterior leaflet.  8. The mitral valve is abnormal. Severe mitral valve regurgitation.  9. The tricuspid valve is abnormal. Tricuspid valve regurgitation is moderate.  10. The aortic valve is tricuspid. Aortic valve regurgitation is mild. Mild aortic valve sclerosis is present, with no evidence of aortic valve stenosis. 11. Moderately dilated pulmonary artery. 12. The inferior vena cava is dilated in size with <50% respiratory variability, suggesting right atrial pressure of 15 mmHg. Comparison(s): Changes from prior study are noted. 01/04/2016: LVEF 40-45%, pacer wire, mild AI, moderate MR, moderate LAE, mild RAE. FINDINGS  Left Ventricle: Left ventricular ejection fraction, by estimation, is 25 to 30%. The left ventricle has severely decreased function. The left ventricle demonstrates global hypokinesis. Severe akinesis of the left ventricular, basal-mid inferior wall and  inferolateral wall. The left ventricular internal cavity size was normal in size. There is no left ventricular hypertrophy. Abnormal (paradoxical) septal motion, consistent with RV pacemaker. Left ventricular diastolic function could not be evaluated due to atrial fibrillation. Left ventricular diastolic function could not be evaluated. Right Ventricle: The right ventricular size is mildly enlarged. No increase in right ventricular wall thickness. Right ventricular systolic function is moderately reduced. There is severely elevated pulmonary artery systolic pressure. The tricuspid regurgitant velocity is 3.87 m/s, and with an assumed right atrial pressure of 15 mmHg, the estimated right ventricular systolic pressure is 76.2 mmHg. Left Atrium: Left atrial size was massively dilated. Right Atrium: Right atrial size was moderately dilated. Pericardium: There is no evidence of pericardial effusion. Mitral Valve: The mitral valve is abnormal. There is mild thickening of the mitral valve leaflet(s). Mild mitral subvalvular calcification. Mild mitral subvalvular thickening/fibrosis. Tethering involving the posterior leaflet is noted. Severe mitral valve regurgitation, with eccentric posteriorly directed jet. Tricuspid  Valve: The tricuspid valve is abnormal. Tricuspid valve regurgitation is moderate. Aortic Valve: The aortic valve is tricuspid. Aortic valve regurgitation is mild. Mild aortic valve sclerosis is present, with no evidence of aortic valve stenosis. Pulmonic Valve: The pulmonic valve was grossly normal. Pulmonic valve regurgitation is mild. Aorta: The aortic root and ascending aorta are structurally normal, with no evidence of dilitation. Pulmonary Artery: The pulmonary artery is moderately dilated. Venous: The inferior vena cava is dilated in size with less than 50% respiratory variability, suggesting right atrial pressure of 15 mmHg. IAS/Shunts: No atrial level shunt detected by color flow Doppler. Additional Comments: A pacer wire is visualized.  LEFT VENTRICLE PLAX 2D LVIDd:         5.40 cm LVIDs:         4.80 cm LV PW:         0.90 cm LV IVS:        1.00 cm  RIGHT VENTRICLE         IVC TAPSE (M-mode): 1.8 cm  IVC diam: 2.00 cm LEFT ATRIUM              Index        RIGHT ATRIUM  Index LA diam:        4.60 cm  2.62 cm/m   RA Area:     23.50 cm LA Vol (A2C):   184.0 ml 104.70 ml/m RA Volume:   71.40 ml  40.63 ml/m LA Vol (A4C):   161.0 ml 91.61 ml/m LA Biplane Vol: 170.0 ml 96.73 ml/m  AORTIC VALVE LVOT Vmax:   73.10 cm/s LVOT Vmean:  44.100 cm/s LVOT VTI:    0.122 m  AORTA Ao Root diam: 3.60 cm Ao Asc diam:  3.40 cm MR Peak grad:    136.9 mmHg  TRICUSPID VALVE MR Mean grad:    60.0 mmHg   TR Peak grad:   59.9 mmHg MR Vmax:         585.00 cm/s TR Vmax:        387.00 cm/s MR Vmean:        349.0 cm/s MR PISA:         3.08 cm    SHUNTS MR PISA Eff ROA: 17 mm      Systemic VTI: 0.12 m MR PISA Radius:  0.70 cm Lyman Bishop MD Electronically signed by Lyman Bishop MD Signature Date/Time: 10/25/2020/4:41:18 PM    Final

## 2020-10-26 NOTE — Progress Notes (Signed)
PT Cancellation Note  Patient Details Name: IVANN TRIMARCO MRN: [id number] DOB: 10/21/1932   Cancelled Treatment:    Reason Eval/Treat Not Completed: Medical issues which prohibited therapy  RN asked to hold PT at this time as pt undergoing EKG, HR elevated despite amiodarone, and she is contacting cardiology.   Arby Barrette, PT Pager [phone]  Rexanne Mano 10/26/2020, 11:12 AM

## 2020-10-26 NOTE — TOC Initial Note (Signed)
Transition of Care [facility]) - Initial/Assessment Note    Patient Details  Name: [name] [name] MRN: [id number] Date of Birth: [birth year]  Transition of Care [facility]) CM/SW Contact:    Benard Halsted, LCSW Phone Number: 10/26/2020, 5:58 PM  Clinical Narrative:                 CSW received consult for possible SNF placement at time of discharge. CSW spoke with patient's daughter regarding PT recommendation of SNF placement at time of discharge. Patient's daughter reported that she is currently unable to care for patient at her home given patient's current physical needs and fall risk. She expressed understanding of PT recommendation and is agreeable to SNF placement at time of discharge but does not want it to be like when her mom went to [facility] and ended up passing away. CSW discussed insurance authorization process and provided Medicare SNF ratings list. Patient has received the COVID vaccines. CSW explained that the only SNF accepting COVID patients is [place], but that they are currently on an admissions hold. She stated that [facility] would be acceptable but did not want him going outside of [facility]. CSW will continue to follow.   Expected Discharge Plan: Skilled Nursing Facility Barriers to Discharge: Ship broker, Continued Medical Work up, SNF Pending bed offer   Patient Goals and CMS Choice Patient states their goals for this hospitalization and ongoing recovery are:: Rehab CMS Medicare.gov Compare Post Acute Care list provided to:: Patient Represenative (must comment) Choice offered to / list presented to : Adult Children  Expected Discharge Plan and Services Expected Discharge Plan: [place] In-house Referral: Clinical Social Work   Post Acute Care Choice: [place] Living arrangements for the past 2 months: Apartment                                      Prior Living Arrangements/Services Living arrangements for the  past 2 months: Apartment Lives with:: Self Patient language and need for interpreter reviewed:: Yes Do you feel safe going back to the place where you live?: Yes      Need for Family Participation in Patient Care: Yes (Comment) Care giver support system in place?: Yes (comment)   Criminal Activity/Legal Involvement Pertinent to Current Situation/Hospitalization: No - Comment as needed  Activities of Daily Living Home Assistive Devices/Equipment: Cane (specify quad or straight) ADL Screening (condition at time of admission) Patient's cognitive ability adequate to safely complete daily activities?: No Is the patient deaf or have difficulty hearing?: Yes Does the patient have difficulty seeing, even when wearing glasses/contacts?: No Does the patient have difficulty concentrating, remembering, or making decisions?: Yes Patient able to express need for assistance with ADLs?: Yes Does the patient have difficulty dressing or bathing?: Yes Independently performs ADLs?: No Does the patient have difficulty walking or climbing stairs?: Yes Weakness of Legs: None Weakness of Arms/Hands: None  Permission Sought/Granted Permission sought to share information with : Facility Sport and exercise psychologist, Family Supports Permission granted to share information with : No  Share Information with NAME: [name]  Permission granted to share info w AGENCY: SNFs  Permission granted to share info w Relationship: Daughter  Permission granted to share info w Contact Information: [phone]  Emotional Assessment   Attitude/Demeanor/Rapport: Unable to Assess Affect (typically observed): Unable to Assess Orientation: : Oriented to Self Alcohol / Substance Use: Not Applicable Psych Involvement: No (comment)  Admission diagnosis:  GI bleed [K92.2] Lower GI bleed [K92.2] Wide-complex tachycardia ([place]) [I47.2] Sepsis with acute respiratory failure without septic shock, due to unspecified organism, unspecified  whether hypoxia or hypercapnia present ([place]) [A41.9, R65.20, J96.00] COVID-19 [U07.1] Patient Active Problem List   Diagnosis Date Noted  . Macrocytic anemia 10/23/2020  . Hypoalbuminemia 10/23/2020  . COVID-19   . Sepsis ([place])   . Wide-complex tachycardia ([place])   . GI bleed 09/23/2018  . Ulceration of intestine   . Lower GI bleed 07/01/2018  . ICD in place- MDT 2009 07/03/2014  . History of GI bleed-  07/03/2014  . Essential hypertension 07/03/2014  . Acute on chronic systolic (congestive) heart failure ([place]) 10/15/2013  . Dizziness 10/14/2013  . Chronic systolic CHF (congestive heart failure) ([place]) 07/24/2013  . Ischemic cardiomyopathy 10/14/2011  . Automatic implantable cardioverter/defibrillator (AICD) activation 10/02/2011  . A-fib ([place]) 03/21/2011  . CAD (coronary artery disease) 03/21/2011   PCP:  Crist Infante, MD Pharmacy:   [place], [place] [address] Phone: [phone] Fax: [phone]  [facility] [phone] [place], [place] - [place] [address] [place] [address] Phone: [phone] Fax: [phone]     Social Determinants of Health (SDOH) Interventions    Readmission Risk Interventions No flowsheet data found.

## 2020-10-26 NOTE — Progress Notes (Signed)
Progress Note  Patient Name: [name] [name] Date of Encounter: 10/26/2020  [facility] Cardiologist: Peter [name], MD     Inpatient Medications    Scheduled Meds: . albuterol  2 puff Inhalation Q6H  . vitamin C  500 mg Oral Daily  . Chlorhexidine Gluconate Cloth  6 each Topical Daily  . cholecalciferol  1,000 Units Oral Daily  . multivitamin with minerals  1 tablet Oral Daily  . pantoprazole (PROTONIX) IV  40 mg Intravenous Q12H  . predniSONE  40 mg Oral Daily  . sodium chloride flush  3 mL Intravenous Q12H  . tamsulosin  0.4 mg Oral Daily  . vitamin B-12  1,000 mcg Oral Daily  . zinc sulfate  220 mg Oral Daily   Continuous Infusions: . amiodarone 30 mg/hr (10/26/20 0051)   PRN Meds: acetaminophen, albuterol, chlorpheniramine-HYDROcodone, diphenoxylate-atropine, guaiFENesin-dextromethorphan   Vital Signs    Vitals:   10/26/20 0345 10/26/20 0705 10/26/20 0732 10/26/20 1150  BP: 125/75  133/78 (!) 123/98  Pulse: 98  98 97  Resp: 20 17 17 19   Temp: 98.1 F (36.7 C)  (!) 97.5 F (36.4 C) 97.8 F (36.6 C)  TempSrc: Axillary  Oral Oral  SpO2: 100% 100% 99% 100%  Weight:      Height:        Intake/Output Summary (Last 24 hours) at 10/26/2020 1337 Last data filed at 10/26/2020 0700 Gross per 24 hour  Intake --  Output 575 ml  Net -575 ml   Last 3 Weights 10/23/2020 06/15/2020 01/08/2020  Weight (lbs) 136 lb 11 oz 137 lb 140 lb  Weight (kg) 62 kg 62.143 kg 63.504 kg      Telemetry    AFib with intermittent RVR - Personally Reviewed  ECG        Labs    High Sensitivity Troponin:   Recent Labs  Lab 10/23/20 1307 10/23/20 1640  TROPONINIHS 63* 99*      Chemistry Recent Labs  Lab 10/24/20 0341 10/25/20 0223 10/26/20 0247  NA 140 136 134*  K 3.9 4.8 4.3  CL 107 104 102  CO2 23 22 22   GLUCOSE 120* 156* 157*  BUN 15 26* 24*  CREATININE 0.93 1.15 0.99  CALCIUM 8.3* 8.6* 8.5*  PROT 5.3* 5.3* 5.7*  ALBUMIN 2.8* 2.8* 2.9*  AST 40  36 33  ALT 25 24 23   ALKPHOS 31* 29* 30*  BILITOT 1.0 0.8 1.0  GFRNONAA >60 >60 >60  ANIONGAP 10 10 10      Hematology Recent Labs  Lab 10/25/20 0941 10/25/20 1836 10/26/20 0247  WBC 14.8* 12.7* 11.9*  RBC 3.07* 3.19* 3.07*  HGB 9.7* 10.1* 9.5*  HCT 29.6* 30.9* 29.3*  MCV 96.4 96.9 95.4  MCH 31.6 31.7 30.9  MCHC 32.8 32.7 32.4  RDW 14.5 14.3 14.3  PLT 148* 151 151    BNP Recent Labs  Lab 10/23/20 1640  BNP 541.6*     DDimer  Recent Labs  Lab 10/24/20 0341 10/25/20 0223 10/26/20 0247  DDIMER 1.91* 1.51* 1.25*     Radiology    ECHOCARDIOGRAM COMPLETE  Result Date: 10/25/2020    ECHOCARDIOGRAM REPORT   Patient Name:   [name] Date of Exam: 10/25/2020 Medical Rec #:  [id number]      Height:       69.0 in Accession #:    [id number]     Weight:       136.7 lb Date of Birth:  05/04/1932  BSA:          1.757 m Patient Age:    86 years       BP:           101/83 mmHg Patient Gender: M              HR:           100 bpm. Exam Location:  Inpatient Procedure: 2D Echo, Cardiac Doppler and Color Doppler Indications:    I48.91* Unspeicified atrial fibrillation  History:        Patient has prior history of Echocardiogram examinations, most                 recent 01/04/2016. Prior CABG. Covid19 positive.  Sonographer:    Merrie Roof RDCS Referring Phys: [place]  1. Left ventricular ejection fraction, by estimation, is 25 to 30%. The left ventricle has severely decreased function. The left ventricle demonstrates global hypokinesis. Left ventricular diastolic function could not be evaluated. There is severe akinesis of the left ventricular, basal-mid inferior and inferolateral walls. Incoordinate (paced) septal motion.  2. Right ventricular systolic function is moderately reduced. The right ventricular size is mildly enlarged. There is severely elevated pulmonary artery systolic pressure. The estimated right ventricular systolic pressure is 16.1 mmHg.  3.  Left atrial size was massively dilated.  4. Right atrial size was moderately dilated.  5. Mild mitral subvalvular calcification.  6. Mild mitral subvalvular thickening/fibrosis.  7. Tethering of the mitral valve posterior leaflet.  8. The mitral valve is abnormal. Severe mitral valve regurgitation.  9. The tricuspid valve is abnormal. Tricuspid valve regurgitation is moderate. 10. The aortic valve is tricuspid. Aortic valve regurgitation is mild. Mild aortic valve sclerosis is present, with no evidence of aortic valve stenosis. 11. Moderately dilated pulmonary artery. 12. The inferior vena cava is dilated in size with <50% respiratory variability, suggesting right atrial pressure of 15 mmHg. Comparison(s): Changes from prior study are noted. 01/04/2016: LVEF 40-45%, pacer wire, mild AI, moderate MR, moderate LAE, mild RAE. FINDINGS  Left Ventricle: Left ventricular ejection fraction, by estimation, is 25 to 30%. The left ventricle has severely decreased function. The left ventricle demonstrates global hypokinesis. Severe akinesis of the left ventricular, basal-mid inferior wall and  inferolateral wall. The left ventricular internal cavity size was normal in size. There is no left ventricular hypertrophy. Abnormal (paradoxical) septal motion, consistent with RV pacemaker. Left ventricular diastolic function could not be evaluated due to atrial fibrillation. Left ventricular diastolic function could not be evaluated. Right Ventricle: The right ventricular size is mildly enlarged. No increase in right ventricular wall thickness. Right ventricular systolic function is moderately reduced. There is severely elevated pulmonary artery systolic pressure. The tricuspid regurgitant velocity is 3.87 m/s, and with an assumed right atrial pressure of 15 mmHg, the estimated right ventricular systolic pressure is 09.6 mmHg. Left Atrium: Left atrial size was massively dilated. Right Atrium: Right atrial size was moderately dilated.  Pericardium: There is no evidence of pericardial effusion. Mitral Valve: The mitral valve is abnormal. There is mild thickening of the mitral valve leaflet(s). Mild mitral subvalvular calcification. Mild mitral subvalvular thickening/fibrosis. Tethering involving the posterior leaflet is noted. Severe mitral valve regurgitation, with eccentric posteriorly directed jet. Tricuspid Valve: The tricuspid valve is abnormal. Tricuspid valve regurgitation is moderate. Aortic Valve: The aortic valve is tricuspid. Aortic valve regurgitation is mild. Mild aortic valve sclerosis is present, with no evidence of aortic valve stenosis. Pulmonic Valve: The  pulmonic valve was grossly normal. Pulmonic valve regurgitation is mild. Aorta: The aortic root and ascending aorta are structurally normal, with no evidence of dilitation. Pulmonary Artery: The pulmonary artery is moderately dilated. Venous: The inferior vena cava is dilated in size with less than 50% respiratory variability, suggesting right atrial pressure of 15 mmHg. IAS/Shunts: No atrial level shunt detected by color flow Doppler. Additional Comments: A pacer wire is visualized.  LEFT VENTRICLE PLAX 2D LVIDd:         5.40 cm LVIDs:         4.80 cm LV PW:         0.90 cm LV IVS:        1.00 cm  RIGHT VENTRICLE         IVC TAPSE (M-mode): 1.8 cm  IVC diam: 2.00 cm LEFT ATRIUM              Index        RIGHT ATRIUM           Index LA diam:        4.60 cm  2.62 cm/m   RA Area:     23.50 cm LA Vol (A2C):   184.0 ml 104.70 ml/m RA Volume:   71.40 ml  40.63 ml/m LA Vol (A4C):   161.0 ml 91.61 ml/m LA Biplane Vol: 170.0 ml 96.73 ml/m  AORTIC VALVE LVOT Vmax:   73.10 cm/s LVOT Vmean:  44.100 cm/s LVOT VTI:    0.122 m  AORTA Ao Root diam: 3.60 cm Ao Asc diam:  3.40 cm MR Peak grad:    136.9 mmHg  TRICUSPID VALVE MR Mean grad:    60.0 mmHg   TR Peak grad:   59.9 mmHg MR Vmax:         585.00 cm/s TR Vmax:        387.00 cm/s MR Vmean:        349.0 cm/s MR PISA:         3.08 cm     SHUNTS MR PISA Eff ROA: 17 mm      Systemic VTI: 0.12 m MR PISA Radius:  0.70 cm Lyman Bishop MD Electronically signed by Lyman Bishop MD Signature Date/Time: 10/25/2020/4:41:18 PM    Final     Cardiac Studies   Low EF by echo  Patient Profile     86 y.o. male with COVID, GI bleed.  Assessment & Plan    AFib: On IV Amio for rate control.  Add back digoxin today given low EF.  Pharmacy consult to help with loading dose. Rate will also increase with worsening anemia.  If HR continues to be high, would add dilt 30 mg q6 hours.       For questions or updates, please contact [place] Please consult [email or web address] for contact info under        Signed, Larae Grooms, MD  10/26/2020, 1:37 PM

## 2020-10-26 NOTE — Progress Notes (Addendum)
Pt is having minimal COVID symptoms. Ok to [employer] per Dr. Sloan Leiter. He has gotten 4 doses. Cards consult rx for digoxin load. Pt was on this prior to admission. We will get a level then give load. Will give dig 0.5mg  IV x1.  Addendum:  Dig level came back at 0.3. Ok to resume home digoxin 0.0625mg  qday in AM  [place], PharmD, [place], AAHIVP, CPP Infectious Disease Pharmacist 10/26/2020 11:14 AM

## 2020-10-26 NOTE — Progress Notes (Signed)
Pt in no distress. Will recheck vitals in the next hour to see if there is an improvement. Charge Nurse Vicente Males, RN notified.  Will continue to monitor.

## 2020-10-27 LAB — COMPREHENSIVE METABOLIC PANEL
ALT: 27 U/L (ref 0–44)
AST: 40 U/L (ref 15–41)
Albumin: 3.1 g/dL — ABNORMAL LOW (ref 3.5–5.0)
Alkaline Phosphatase: 35 U/L — ABNORMAL LOW (ref 38–126)
Anion gap: 16 — ABNORMAL HIGH (ref 5–15)
BUN: 27 mg/dL — ABNORMAL HIGH (ref 8–23)
CO2: 20 mmol/L — ABNORMAL LOW (ref 22–32)
Calcium: 8.8 mg/dL — ABNORMAL LOW (ref 8.9–10.3)
Chloride: 98 mmol/L (ref 98–111)
Creatinine, Ser: 1.35 mg/dL — ABNORMAL HIGH (ref 0.61–1.24)
GFR, Estimated: 51 mL/min — ABNORMAL LOW (ref >60)
Glucose, Bld: 115 mg/dL — ABNORMAL HIGH (ref 70–99)
Potassium: 4 mmol/L (ref 3.5–5.1)
Sodium: 134 mmol/L — ABNORMAL LOW (ref 135–145)
Total Bilirubin: 1.4 mg/dL — ABNORMAL HIGH (ref 0.3–1.2)
Total Protein: 5.7 g/dL — ABNORMAL LOW (ref 6.5–8.1)

## 2020-10-27 LAB — D-DIMER, QUANTITATIVE: D-Dimer, Quant: 1.42 ug/mL-FEU — ABNORMAL HIGH (ref 0.00–0.50)

## 2020-10-27 LAB — CBC WITH DIFFERENTIAL/PLATELET
Abs Immature Granulocytes: 0.07 10*3/uL (ref 0.00–0.07)
Basophils Absolute: 0 10*3/uL (ref 0.0–0.1)
Basophils Relative: 0 %
Eosinophils Absolute: 0 10*3/uL (ref 0.0–0.5)
Eosinophils Relative: 0 %
HCT: 29.4 % — ABNORMAL LOW (ref 39.0–52.0)
Hemoglobin: 9.8 g/dL — ABNORMAL LOW (ref 13.0–17.0)
Immature Granulocytes: 1 %
Lymphocytes Relative: 2 %
Lymphs Abs: 0.2 10*3/uL — ABNORMAL LOW (ref 0.7–4.0)
MCH: 31.5 pg (ref 26.0–34.0)
MCHC: 33.3 g/dL (ref 30.0–36.0)
MCV: 94.5 fL (ref 80.0–100.0)
Monocytes Absolute: 0.6 10*3/uL (ref 0.1–1.0)
Monocytes Relative: 7 %
Neutro Abs: 9 10*3/uL — ABNORMAL HIGH (ref 1.7–7.7)
Neutrophils Relative %: 90 %
Platelets: 177 10*3/uL (ref 150–400)
RBC: 3.11 MIL/uL — ABNORMAL LOW (ref 4.22–5.81)
RDW: 14.1 % (ref 11.5–15.5)
WBC: 9.8 10*3/uL (ref 4.0–10.5)
nRBC: 0 % (ref 0.0–0.2)

## 2020-10-27 LAB — CBC
HCT: 29 % — ABNORMAL LOW (ref 39.0–52.0)
HCT: 27.8 % — ABNORMAL LOW (ref 39.0–52.0)
Hemoglobin: 9.5 g/dL — ABNORMAL LOW (ref 13.0–17.0)
Hemoglobin: 9.2 g/dL — ABNORMAL LOW (ref 13.0–17.0)
MCH: 30.7 pg (ref 26.0–34.0)
MCH: 31.2 pg (ref 26.0–34.0)
MCHC: 32.8 g/dL (ref 30.0–36.0)
MCHC: 33.1 g/dL (ref 30.0–36.0)
MCV: 93.9 fL (ref 80.0–100.0)
MCV: 94.2 fL (ref 80.0–100.0)
Platelets: 171 10*3/uL (ref 150–400)
Platelets: 155 10*3/uL (ref 150–400)
RBC: 3.09 MIL/uL — ABNORMAL LOW (ref 4.22–5.81)
RBC: 2.95 MIL/uL — ABNORMAL LOW (ref 4.22–5.81)
RDW: 13.9 % (ref 11.5–15.5)
RDW: 14 % (ref 11.5–15.5)
WBC: 9.9 10*3/uL (ref 4.0–10.5)
WBC: 9.5 10*3/uL (ref 4.0–10.5)
nRBC: 0 % (ref 0.0–0.2)
nRBC: 0 % (ref 0.0–0.2)

## 2020-10-27 LAB — MAGNESIUM: Magnesium: 2.3 mg/dL (ref 1.7–2.4)

## 2020-10-27 LAB — FERRITIN: Ferritin: 104 ng/mL (ref 24–336)

## 2020-10-27 LAB — C-REACTIVE PROTEIN: CRP: 6.6 mg/dL — ABNORMAL HIGH (ref <1.0)

## 2020-10-27 MED ORDER — LORAZEPAM 2 MG/ML IJ SOLN
0.5000 mg | Freq: Four times a day (QID) | INTRAMUSCULAR | Status: DC | PRN
  Administered 2020-10-27 – 2020-10-29 (×3): 0.5 mg via INTRAVENOUS
  Filled 2020-10-27 (×3): qty 1

## 2020-10-27 MED ORDER — PREDNISONE 5 MG PO TABS
30.0000 mg | ORAL_TABLET | Freq: Every day | ORAL | Status: DC
  Administered 2020-10-28: 30 mg via ORAL
  Filled 2020-10-27: qty 1

## 2020-10-27 MED ORDER — METOPROLOL TARTRATE 25 MG PO TABS
25.0000 mg | ORAL_TABLET | Freq: Three times a day (TID) | ORAL | Status: DC
  Administered 2020-10-27 – 2020-10-28 (×3): 25 mg via ORAL
  Filled 2020-10-27 (×3): qty 1

## 2020-10-27 NOTE — Progress Notes (Signed)
Physical Therapy Treatment Patient Details Name: [name] [name] MRN: [id number] DOB: [birth year] Today's Date: 10/27/2020    History of Present Illness 86 y.o. male with medical history significant of CAD s/p CABG in 2002, s/p PPM, ischemic cardiomyopathy, systolic CHF, HTN, HLD, PAF not on anticoagulation secondary to recurrent GI bleed, back surgery, and history of colon cancer presented 10/23/20 with complaints of weakness, fever, tarry stools x 3 days. +COVID pneumonia with sepsis; while in ED episode of wide-complex tachycardia with hypotension requiring emergent cardioversion.    PT Comments    Patient up in chair and much more confused. Patient anxious and jumping from topic to topic and easily frustrated when his line of thinking is not understood. Wants to go home and go get a haircut (what he discussed most of the time). Also having hallucinations seeing spiders and spider webs with water on them. Balance more impaired during gait requiring up to mod assist to avoid falling x 2. HR up to 133 bpm during ambulation (down to 89 bpm after several minutes resting in sitting). RN made aware.     Follow Up Recommendations  Supervision/Assistance - 24 hour;SNF (per SW, daughter cannot provide level of care pt needs)     Equipment Recommendations  Wheelchair (measurements PT);Wheelchair cushion (measurements PT);3in1 (PT)    Recommendations for Other Services       Precautions / Restrictions Precautions Precautions: Fall Precaution Comments: reports fell recently    Mobility  Bed Mobility                  Transfers Overall transfer level: Needs assistance Equipment used: Straight cane Transfers: Sit to/from Stand Sit to Stand: Min assist         General transfer comment: slight steadying assist upon standing  Ambulation/Gait Ambulation/Gait assistance: Mod assist Gait Distance (Feet): 10 Feet (standing rest, 15) Assistive device: Straight cane Gait  Pattern/deviations: Step-to pattern;Decreased stride length;Trunk flexed;Scissoring;Step-through pattern;Staggering right     General Gait Details: using cane in rt hand; near fall x 2 due to scissoring and poor cane placement (mod assist to recover); moving quickly/impulsively   Stairs             Wheelchair Mobility    Modified Rankin (Stroke Patients Only)       Balance Overall balance assessment: Needs assistance Sitting-balance support: No upper extremity supported;Feet supported Sitting balance-Leahy Scale: Good     Standing balance support: Single extremity supported;During functional activity Standing balance-Leahy Scale: Zero Standing balance comment: rt leaning in static standing; increasing with fatigue                            Cognition Arousal/Alertness: Awake/alert Behavior During Therapy: Restless Overall Cognitive Status: No family/caregiver present to determine baseline cognitive functioning Area of Impairment: Orientation;Memory;Safety/judgement;Awareness;Attention;Following commands;Problem solving                 Orientation Level: Place;Time;Situation (easily oriented to hospital) Current Attention Level: Focused;Sustained Memory: Decreased short-term memory (does not recall orientation information minutes after discus) Following Commands: Follows one step commands inconsistently (internally distracted) Safety/Judgement: Decreased awareness of safety;Decreased awareness of deficits Awareness: Intellectual Problem Solving: Difficulty sequencing;Requires verbal cues;Requires tactile cues General Comments: restless/manipulating telemetry wires/cords throughout; seeing spiders and cobwebs; flies from topic to topic and easily frustrated when PT was unable to following his line of thinking; near fall with cane yet thinks we should let him "just run home and get a few things"  and then come back      Exercises      General Comments         Pertinent Vitals/Pain Pain Assessment: No/denies pain    Home Living                      Prior Function            PT Goals (current goals can now be found in the care plan section) Acute Rehab PT Goals Patient Stated Goal: go home ASAP Time For Goal Achievement: 11/07/20 Potential to Achieve Goals: Good Progress towards PT goals: Not progressing toward goals - comment (incr confusion)    Frequency    Min 2X/week      PT Plan Discharge plan needs to be updated;Frequency needs to be updated    Co-evaluation              AM-PAC PT "6 Clicks" Mobility   Outcome Measure  Help needed turning from your back to your side while in a flat bed without using bedrails?: A Little Help needed moving from lying on your back to sitting on the side of a flat bed without using bedrails?: A Little Help needed moving to and from a bed to a chair (including a wheelchair)?: A Little Help needed standing up from a chair using your arms (e.g., wheelchair or bedside chair)?: A Little Help needed to walk in hospital room?: A Lot Help needed climbing 3-5 steps with a railing? : Total 6 Click Score: 15    End of Session Equipment Utilized During Treatment: Gait belt Activity Tolerance: Treatment limited secondary to medical complications (Comment) (elevated HR) Patient left: in chair;with call bell/phone within reach;with chair alarm set Nurse Communication: Mobility status;Other (comment) (elevated HR, incr confusion, near fall) PT Visit Diagnosis: Unsteadiness on feet (R26.81);Muscle weakness (generalized) (M62.81);History of falling (Z91.81)     Time: 3578-9784 PT Time Calculation (min) (ACUTE ONLY): 30 min  Charges:  $Gait Training: 8-22 mins $Therapeutic Activity: 8-22 mins                      [name] [name], PT Pager [phone]    [name] [name] 10/27/2020, 12:37 PM

## 2020-10-27 NOTE — Progress Notes (Signed)
Patient agitated, trying to get out of bed, taking off his IV; became combative when tried to redirect him. HR elevated, with nonsustained V-tach. MD notified and patient received Ativan 0.5 mg IV per order. At this time patient in bed, more calm, HR in high 80s.

## 2020-10-27 NOTE — Progress Notes (Signed)
Pt in no distress. Will continue to check vital signs Q2. Charge Nurse Vicente Males, RN notified.

## 2020-10-27 NOTE — [place] FL2 (Signed)
[place] MEDICAID FL2 LEVEL OF CARE SCREENING TOOL     IDENTIFICATION  Patient Name: [name] [name] Birthdate: 1932-09-05 Sex: male Admission Date (Current Location): 10/23/2020  [facility] and [place] Number:  Herbalist and Address:  [facility] [place], [place] [address] [place], [place] [address]      Provider Number: [id number]  Attending Physician Name and Address:  Jonetta Osgood, MD  Relative Name and Phone Number:  [name], daughter, [phone]    Current Level of Care: Hospital Recommended Level of Care: [place] Prior Approval Number:    Date Approved/Denied:   PASRR Number: [id number]  Discharge Plan: SNF    Current Diagnoses: Patient Active Problem List   Diagnosis Date Noted  . Macrocytic anemia 10/23/2020  . Hypoalbuminemia 10/23/2020  . COVID-19   . Sepsis ([place])   . Wide-complex tachycardia ([place])   . GI bleed 09/23/2018  . Ulceration of intestine   . Lower GI bleed 07/01/2018  . ICD in place- MDT 2009 07/03/2014  . History of GI bleed-  07/03/2014  . Essential hypertension 07/03/2014  . Acute on chronic systolic (congestive) heart failure ([place]) 10/15/2013  . Dizziness 10/14/2013  . Chronic systolic CHF (congestive heart failure) ([place]) 07/24/2013  . Ischemic cardiomyopathy 10/14/2011  . Automatic implantable cardioverter/defibrillator (AICD) activation 10/02/2011  . A-fib ([place]) 03/21/2011  . CAD (coronary artery disease) 03/21/2011    Orientation RESPIRATION BLADDER Height & Weight     Self  Normal Incontinent, Indwelling catheter Weight: 136 lb 11 oz (62 kg) Height:  5\' 9"  (175.3 cm)  BEHAVIORAL SYMPTOMS/MOOD NEUROLOGICAL BOWEL NUTRITION STATUS      Continent Diet (Please see DC Summary)  AMBULATORY STATUS COMMUNICATION OF NEEDS Skin   Extensive Assist Verbally PU Stage and Appropriate Care (on coccyx)                       Personal Care Assistance Level of Assistance  Bathing, Feeding,  Dressing Bathing Assistance: Maximum assistance Feeding assistance: Limited assistance Dressing Assistance: Limited assistance     Functional Limitations Info  Sight, Hearing Sight Info: Impaired Hearing Info: Impaired      SPECIAL CARE FACTORS FREQUENCY  PT (By licensed PT), OT (By licensed OT)     PT Frequency: 5x/week OT Frequency: 5x/week            Contractures Contractures Info: Not present    Additional Factors Info  Code Status, Allergies, Isolation Precautions Code Status Info: Partial Allergies Info: Iron, Lescol (Fluvastatin Sodium), Pentazocine Lactate, Vytorin (Ezetimibe-simvastatin), Aspirin     Isolation Precautions Info: COVID+ 10/23/20     Current Medications (10/27/2020):  This is the current hospital active medication list Current Facility-Administered Medications  Medication Dose Route Frequency Provider Last Rate Last Admin  . acetaminophen (TYLENOL) tablet 650 mg  650 mg Oral Q6H PRN Smith, Rondell A, MD      . albuterol (VENTOLIN HFA) 108 (90 Base) MCG/ACT inhaler 2 puff  2 puff Inhalation Q4H PRN Smith, Rondell A, MD      . albuterol (VENTOLIN HFA) 108 (90 Base) MCG/ACT inhaler 2 puff  2 puff Inhalation Q6H Fuller Plan A, MD   2 puff at 10/27/20 0744  . amiodarone (NEXTERONE PREMIX) 360-4.14 MG/200ML-% (1.8 mg/mL) IV infusion  30 mg/hr Intravenous Continuous Reino Bellis B, NP 16.67 mL/hr at 10/27/20 0826 30 mg/hr at 10/27/20 0826  . ascorbic acid (VITAMIN C) tablet 500 mg  500 mg Oral Daily  Fuller Plan A, MD   500 mg at 10/27/20 1011  . Chlorhexidine Gluconate Cloth 2 % PADS 6 each  6 each Topical Daily Norval Morton, MD   6 each at 10/27/20 1014  . chlorpheniramine-HYDROcodone (TUSSIONEX) 10-8 MG/5ML suspension 5 mL  5 mL Oral Q12H PRN Fuller Plan A, MD   5 mL at 10/26/20 0902  . cholecalciferol (VITAMIN D3) tablet 1,000 Units  1,000 Units Oral Daily Fuller Plan A, MD   1,000 Units at 10/27/20 1011  . digoxin (LANOXIN) tablet  0.0625 mg  0.0625 mg Oral Daily Pham, Minh Q, RPH-CPP   0.0625 mg at 10/27/20 1010  . diphenoxylate-atropine (LOMOTIL) 2.5-0.025 MG per tablet 1 tablet  1 tablet Oral QID PRN Smith, Rondell A, MD      . guaiFENesin-dextromethorphan (ROBITUSSIN DM) 100-10 MG/5ML syrup 10 mL  10 mL Oral Q4H PRN Fuller Plan A, MD      . multivitamin with minerals tablet 1 tablet  1 tablet Oral Daily Fuller Plan A, MD   1 tablet at 10/27/20 1010  . pantoprazole (PROTONIX) injection 40 mg  40 mg Intravenous Q12H Vena Rua, PA-C   40 mg at 10/27/20 1014  . [START ON 10/28/2020] predniSONE (DELTASONE) tablet 30 mg  30 mg Oral Daily Ghimire, Shanker M, MD      . sodium chloride flush (NS) 0.9 % injection 3 mL  3 mL Intravenous Q12H Smith, Rondell A, MD   3 mL at 10/27/20 1015  . tamsulosin (FLOMAX) capsule 0.4 mg  0.4 mg Oral Daily Jonetta Osgood, MD   0.4 mg at 10/27/20 1011  . vitamin B-12 (CYANOCOBALAMIN) tablet 1,000 mcg  1,000 mcg Oral Daily Tamala Julian, Rondell A, MD   1,000 mcg at 10/27/20 1010  . zinc sulfate capsule 220 mg  220 mg Oral Daily Fuller Plan A, MD   220 mg at 10/27/20 1010     Discharge Medications: Please see discharge summary for a list of discharge medications.  Relevant Imaging Results:  Relevant Lab Results:   Additional Information SSN: [facility]  Benard Halsted, LCSW

## 2020-10-27 NOTE — Evaluation (Signed)
Occupational Therapy Evaluation Patient Details Name: [name] [name] MRN: [id number] DOB: [birth year] Today's Date: 10/27/2020    History of Present Illness 86 y.o. male with medical history significant of CAD s/p CABG in 2002, s/p PPM, ischemic cardiomyopathy, systolic CHF, HTN, HLD, PAF not on anticoagulation secondary to recurrent GI bleed, back surgery, and history of colon cancer presented 10/23/20 with complaints of weakness, fever, tarry stools x 3 days. +COVID pneumonia with sepsis; while in ED episode of wide-complex tachycardia with hypotension requiring emergent cardioversion.   Clinical Impression   Pt from home, living alone - daughter provides meals and Pt stands at sink to bathe, ambulates with SPC and no longer drives - enjoys walking his dog "Smokey". Today Pt is disoriented, but easily re-directed, friendly and willing to participate in evaluation, able to perform transfers min A (A for balance in standing with pivotal steps - extra time but no assist to power into standing), max A for LB ADL (donning underwear, socks, shoes - Pt unable at this time). Cues for attention during grooming tasks (brushing hair and washing face) in sitting. At this time Pt will require continued skilled OT in the acute setting as well as afterwards at the SNF level to maximize safety and independence in ADL and functional transfers to return to PLOF.     Follow Up Recommendations  SNF;Supervision/Assistance - 24 hour    Equipment Recommendations  3 in 1 bedside commode    Recommendations for Other Services       Precautions / Restrictions Precautions Precautions: Fall Precaution Comments: reports fell recently      Mobility Bed Mobility Overal bed mobility: Needs Assistance Bed Mobility: Supine to Sit     Supine to sit: Min assist     General bed mobility comments: cues for sequencing, assist for trunk elevation    Transfers Overall transfer level: Needs assistance Equipment  used: 1 person hand held assist Transfers: Sit to/from Stand Sit to Stand: Min assist         General transfer comment: slight steadying assist upon standing    Balance Overall balance assessment: Needs assistance Sitting-balance support: No upper extremity supported;Feet supported Sitting balance-Leahy Scale: Good     Standing balance support: Single extremity supported;During functional activity Standing balance-Leahy Scale: Poor Standing balance comment: rt leaning in static standing; increasing with fatigue                           ADL either performed or assessed with clinical judgement   ADL Overall ADL's : Needs assistance/impaired Eating/Feeding: Minimal assistance   Grooming: Wash/dry face;Brushing hair;Sitting;Minimal assistance Grooming Details (indicate cue type and reason): cues for attention to task Upper Body Bathing: Moderate assistance   Lower Body Bathing: Maximal assistance   Upper Body Dressing : Maximal assistance Upper Body Dressing Details (indicate cue type and reason): to re-don gown Lower Body Dressing: Maximal assistance;Sit to/from stand Lower Body Dressing Details (indicate cue type and reason): underwear and socks/slippers Toilet Transfer: Stand-pivot;Minimal assistance Toilet Transfer Details (indicate cue type and reason): increased time, min A for balance but Pt able to complete transfer larrgely on his own Toileting- Water quality scientist and Hygiene: Moderate assistance       Functional mobility during ADLs: Minimal assistance (stand pivot transfer only) General ADL Comments: Pt required multimodal cues for task completion throughout session     Vision Baseline Vision/History: Wears glasses Wears Glasses: At all times Patient Visual Report: No change from  baseline       Perception     Praxis      Pertinent Vitals/Pain Pain Assessment: No/denies pain     Hand Dominance Right   Extremity/Trunk Assessment Upper  Extremity Assessment Upper Extremity Assessment: Generalized weakness   Lower Extremity Assessment Lower Extremity Assessment: Defer to PT evaluation   Cervical / Trunk Assessment Cervical / Trunk Assessment: Kyphotic   Communication Communication Communication: HOH   Cognition Arousal/Alertness: Awake/alert Behavior During Therapy: WFL for tasks assessed/performed Overall Cognitive Status: No family/caregiver present to determine baseline cognitive functioning Area of Impairment: Orientation;Memory;Safety/judgement;Awareness;Attention;Following commands;Problem solving                 Orientation Level: Place;Time;Situation (easily oriented to hospital; initially was at "2100") Current Attention Level: Focused;Sustained Memory: Decreased short-term memory (does not recall orientation information minutes after discus) Following Commands: Follows one step commands inconsistently (internally distracted) Safety/Judgement: Decreased awareness of safety;Decreased awareness of deficits Awareness: Intellectual Problem Solving: Difficulty sequencing;Requires verbal cues;Requires tactile cues General Comments: Pt required constant re-driection (easy to do) and while he got frustrated was easy to calm   General Comments  HR WFL during today's session    Exercises     Shoulder Instructions      Home Living Family/patient expects to be discharged to:: Private residence Living Arrangements: Alone Available Help at Discharge: Family;Available PRN/intermittently Type of Home: Apartment Home Access: Level entry     Home Layout: One level     Bathroom Shower/Tub:  (stands at sink to wash)   Biochemist, clinical: Standard     Home Equipment: Cane - single point;Walker - 2 wheels (only uses SPC)          Prior Functioning/Environment Level of Independence: Needs assistance  Gait / Transfers Assistance Needed: walks with straight cane; doesn't like using a walker "too much  trouble"; was walking from car into restaurants; states he takes his dog out twice per day ADL's / Homemaking Assistance Needed: family provides groceries; stands at sink to bathe;    Comments: all history per pt with ?accuracy        OT Problem List: Decreased activity tolerance;Impaired balance (sitting and/or standing);Decreased cognition;Decreased safety awareness      OT Treatment/Interventions: Self-care/ADL training;Therapeutic exercise;DME and/or AE instruction;Therapeutic activities;Patient/family education;Balance training    OT Goals(Current goals can be found in the care plan section) Acute Rehab OT Goals Patient Stated Goal: get home to [facility] (dog) OT Goal Formulation: With patient Time For Goal Achievement: 11/10/20 Potential to Achieve Goals: Good ADL Goals Pt Will Perform Grooming: with supervision;sitting Pt Will Perform Upper Body Dressing: with supervision;sitting Pt Will Perform Lower Body Dressing: with min assist;sit to/from stand Pt Will Transfer to Toilet: with min assist;ambulating Pt Will Perform Toileting - Clothing Manipulation and hygiene: with min assist;sit to/from stand  OT Frequency: Min 2X/week   Barriers to D/C: Decreased caregiver support  supportive family - but unable to provide 24 hour assist       Co-evaluation              AM-PAC OT "6 Clicks" Daily Activity     Outcome Measure Help from another person eating meals?: A Little Help from another person taking care of personal grooming?: A Little Help from another person toileting, which includes using toliet, bedpan, or urinal?: A Lot Help from another person bathing (including washing, rinsing, drying)?: A Lot Help from another person to put on and taking off regular upper body clothing?: A Lot Help from  another person to put on and taking off regular lower body clothing?: A Lot 6 Click Score: 14   End of Session Equipment Utilized During Treatment: Gait belt Nurse  Communication: Mobility status  Activity Tolerance: Patient tolerated treatment well Patient left: in chair;with call bell/phone within reach;with chair alarm set;with nursing/sitter in room  OT Visit Diagnosis: Unsteadiness on feet (R26.81);Muscle weakness (generalized) (M62.81);Other symptoms and signs involving cognitive function                Time: 0164-2903 OT Time Calculation (min): 41 min Charges:  OT General Charges $OT Visit: 1 Visit OT Evaluation $OT Eval Moderate Complexity: 1 Mod OT Treatments $Self Care/Home Management : 8-22 mins $Therapeutic Activity: 8-22 mins  Jesse Sans OTR/L Acute Rehabilitation Services Pager: [phone] Office: [phone]  Merri Ray Andreus Cure 10/27/2020, 2:41 PM

## 2020-10-27 NOTE — Progress Notes (Signed)
Called patient's daughter [name] [name] to give her updates about her dad. She called at shift change wanting updates. Unit secretary took a message and informed me to give her a call. [name] didn't have any further concerns after receiving an update. Assured her staff will continue to monitor for patient safety and notify her of any changes in status.

## 2020-10-27 NOTE — Progress Notes (Signed)
PROGRESS NOTE                                                                                                                                                                                                             Patient Demographics:    [name] [name], is a 86 y.o. male, DOB - 1932/06/27, QAS:[id number]  Outpatient Primary MD for the patient is Crist Infante, MD   Admit date - 10/23/2020   LOS - 4  Chief Complaint  Patient presents with  . Weakness       Brief Narrative: Patient is a 86 y.o. male with PMHx of CAD s/p CABG in 2002, s/p PPM implantation, chronic systolic heart failure, chronic atrial fibrillation-not on anticoagulation (history of recurrent GI bleeding), history of colon cancer, history of recurrent GI bleeding-who presented with a 3-4-day history of weakness, dark appearing stools.  In the emergency room-he was found to be febrile.  While in the ED-he had an episode of wide-complex tachycardia associated with hypotension-required emergent cardioversion.  He was also found to be COVID-19 positive and acute urinary retention.  He was subsequently admitted to the hospitalist service.  See below for further details.  COVID-19 vaccinated status: Vaccinated (but not a booster)  Significant Events: 11/26>> Admit to [facility] for generalized weakness, fever, and dark-colored stools-COVID-19 positive.  Had episode of wide-complex tachycardia with hypotension requiring emergent cardioversion. 11/26>> acute urinary retention-difficult Foley catheter -18 French coud catheter placed by urology.  Significant studies: 11/26>>Chest x-ray: Streaky left basilar opacities, mild cardiomegaly and pulmonary vascular congestion. 11/27>> chest x-ray: Bilateral patchy opacities. 11/28>> Echo: EF 25-30%.  RV systolic function moderately reduced.  COVID-19 medications: Steroids: 11/26>> Remdesivir:  11/26>>11/30  Antibiotics: None  Microbiology data: 11/26 >>blood culture: No growth  Procedures: 11/26>> cardioversion by ED MD for wide-complex tachycardia (presumed V. tach) associated with hypotension.  Consults: Cardiology, GI, urology  DVT prophylaxis: SCDs Start: 10/23/20 1506   Subjective:   Confused-wants his mittens out today.  Also confused-somewhat redirectable   Assessment  & Plan :   Acute Hypoxic Resp Failure due to Covid 19 Viral pneumonia: Improved-remains on room air-we will complete Remdesivir today-continue tapering steroids.  Fever: afebrile O2 requirements:  SpO2: 98 % O2 Flow Rate (L/min): 3 L/min   COVID-19 Labs: Recent Labs    10/25/20 0223 10/26/20 0247 10/27/20 0051  DDIMER 1.51* 1.25* 1.42*  FERRITIN 94 105 104  CRP 13.6* 9.9* 6.6*       Component Value Date/Time   BNP 541.6 (H) 10/23/2020 1640   BNP 379.4 (H) 02/25/2016 0951    Recent Labs  Lab 10/23/20 1640  PROCALCITON 0.63    Lab Results  Component Value Date   SARSCOV2NAA POSITIVE (A) 10/23/2020     Prone/Incentive Spirometry: encouraged  incentive spirometry use 3-4/hour.  Sepsis: Likely secondary to COVID-19 infection-sepsis physiology has resolved. Cultures negative so far.  Wide-complex tachycardia: Reviewed ED/cardiology notes-concern for V. tach-cardioverted by EDMD on admission on 11/26.  On amiodarone infusion currently-cardiology following-defer further to cardiology.  Chronic atrial fibrillation with RVR: Rate better controlled today-on amiodarone infusion-digoxin-not a candidate for anticoagulation due to recurrent GI bleeding.  Await further recommendations from cardiology today.   Chronic systolic heart failure: Euvolemic-does not require diuretics.  GI bleeding: No overt GI bleeding for the past several days.  Hemoglobin stable-GI signed off.  Continue PPI.  Acute urinary retention: S/p coud catheter insertion by urology on  11/26-recommendations-continue Flomax-and keep catheter for 1 week prior to initiating a voiding trial.  Deconditioning: Secondary to acute illness-superimposed on some amount of chronic debility.  Plans are for either SNF versus home health on discharge.  Social worker following.  Delirium: Per patient's daughter-patient does get confused in the hospital.  This morning he is somewhat confused but redirectable.  Steroids continue to be tapered down-continue to maintain delirium precautions.  Goals of care: Discussed with patient earlier during the hospital stay when he was a little bit more coherent-and subsequently with patient's daughter multiple times-continue medical treatment-DNI in place-okay for CPR/defibrillation/ACLS medications.    RN pressure injury documentation: Pressure Injury 10/23/20 Coccyx (Active)  10/23/20 1600  Location: Coccyx  Location Orientation:   Staging:   Wound Description (Comments):   Present on Admission:     GI prophylaxis: PPI  ABG:    Component Value Date/Time   HCO3 22.8 06/11/2007 1615   TCO2 24 06/11/2007 1615   ACIDBASEDEF 1.0 06/11/2007 1615    Vent Settings: N/A    Condition - Extremely Guarded  Family Communication  : Daughter [name]  [phone]) on 11/29-we will update family later today.  Code Status :  Full Code  Diet :  Diet Order            DIET SOFT Room service appropriate? No; Fluid consistency: Thin  Diet effective now                  Disposition Plan  :   Status is: Inpatient  Remains inpatient appropriate because:Inpatient level of care appropriate due to severity of illness   Dispo: The patient is from: Home              Anticipated d/c is to: TBD              Anticipated d/c date is: > 3 days              Patient currently is not medically stable to d/c.  Barriers to discharge: Hypoxia requiring O2 supplementation/complete 5 days of IV Remdesivir/on IV amiodarone for A. fib with RVR, ongoing GI bleeding  necessitating further inpatient monitoring and possible work-up.  Antimicorbials  :    Anti-infectives (From admission, onward)   Start     Dose/Rate Route Frequency Ordered Stop   10/24/20 1000  remdesivir 100 mg in sodium chloride 0.9 % 100 mL IVPB  Status:  Discontinued       "Followed by" Linked Group Details   100 mg 200 mL/hr over 30 Minutes Intravenous Daily 10/23/20 1509 10/26/20 1111   10/23/20 1600  remdesivir 200 mg in sodium chloride 0.9% 250 mL IVPB       "Followed by" Linked Group Details   200 mg 580 mL/hr over 30 Minutes Intravenous Once 10/23/20 1509 10/23/20 1922   10/23/20 1215  cefTRIAXone (ROCEPHIN) 1 g in sodium chloride 0.9 % 100 mL IVPB        1 g 200 mL/hr over 30 Minutes Intravenous  Once 10/23/20 1206 10/23/20 1307   10/23/20 1215  azithromycin (ZITHROMAX) 500 mg in sodium chloride 0.9 % 250 mL IVPB        500 mg 250 mL/hr over 60 Minutes Intravenous  Once 10/23/20 1206 10/23/20 1341      Inpatient Medications  Scheduled Meds: . albuterol  2 puff Inhalation Q6H  . vitamin C  500 mg Oral Daily  . Chlorhexidine Gluconate Cloth  6 each Topical Daily  . cholecalciferol  1,000 Units Oral Daily  . digoxin  0.0625 mg Oral Daily  . multivitamin with minerals  1 tablet Oral Daily  . pantoprazole (PROTONIX) IV  40 mg Intravenous Q12H  . predniSONE  40 mg Oral Daily  . sodium chloride flush  3 mL Intravenous Q12H  . tamsulosin  0.4 mg Oral Daily  . vitamin B-12  1,000 mcg Oral Daily  . zinc sulfate  220 mg Oral Daily   Continuous Infusions: . amiodarone 30 mg/hr (10/27/20 0826)   PRN Meds:.acetaminophen, albuterol, chlorpheniramine-HYDROcodone, diphenoxylate-atropine, guaiFENesin-dextromethorphan   Time Spent in minutes  35   See all Orders from today for further details   Oren Binet M.D on 10/27/2020 at 10:30 AM  To page go to [email or web address] - use universal password  [facility] -  Office  [phone]    Objective:   Vitals:    10/27/20 0200 10/27/20 0400 10/27/20 0600 10/27/20 0751  BP: (!) 134/95 118/84 115/78 130/72  Pulse: 86 (!) 59 60 63  Resp: (!) 23 18 16 17   Temp: 97.6 F (36.4 C) 97.9 F (36.6 C) 98 F (36.7 C) 97.7 F (36.5 C)  TempSrc: Oral Oral Oral Axillary  SpO2: (!) 86% 97% 98% 98%  Weight:      Height:        Wt Readings from Last 3 Encounters:  10/23/20 62 kg  06/15/20 62.1 kg  01/08/20 63.5 kg     Intake/Output Summary (Last 24 hours) at 10/27/2020 1030 Last data filed at 10/27/2020 1018 Gross per 24 hour  Intake 180 ml  Output 400 ml  Net -220 ml     Physical Exam Gen Exam: Pleasantly confused-but not in any major distress. HEENT:atraumatic, normocephalic Chest: B/L clear to auscultation anteriorly CVS:S1S2 irregular Abdomen:soft non tender, non distended Extremities:no edema Neurology: Non focal Skin: no rash   Data Review:    CBC Recent Labs  Lab 10/23/20 1129 10/23/20 1640 10/24/20 0341 10/24/20 1046 10/25/20 0223 10/25/20 0941 10/25/20 1836 10/26/20 0247 10/26/20 1510 10/26/20 1841 10/27/20 0051  WBC 9.4  --  11.6*   < > 12.2*   < > 12.7* 11.9* 10.8* 10.4 9.8  HGB 9.8*   < > 9.3*   < > 9.6*   < > 10.1* 9.5* 10.6* 10.8* 9.8*  HCT 29.3*   < > 28.7*   < > 29.2*   < > 30.9* 29.3* 32.2* 33.0* 29.4*  PLT  136*  --  125*   < > 137*   < > 151 151 168 175 177  MCV 100.3*  --  97.3   < > 96.7   < > 96.9 95.4 95.5 95.4 94.5  MCH 33.6  --  31.5   < > 31.8   < > 31.7 30.9 31.5 31.2 31.5  MCHC 33.4  --  32.4   < > 32.9   < > 32.7 32.4 32.9 32.7 33.3  RDW 14.3  --  14.5   < > 14.5   < > 14.3 14.3 14.0 14.0 14.1  LYMPHSABS 0.3*  --  0.5*  --  0.5*  --   --  0.4*  --   --  0.2*  MONOABS 0.6  --  0.4  --  0.5  --   --  0.4  --   --  0.6  EOSABS 0.0  --  0.0  --  0.0  --   --  0.0  --   --  0.0  BASOSABS 0.0  --  0.0  --  0.0  --   --  0.0  --   --  0.0   < > = values in this interval not displayed.    Chemistries  Recent Labs  Lab 10/23/20 1129 10/23/20 1307  10/24/20 0341 10/25/20 0223 10/26/20 0247 10/27/20 0051  NA 141  --  140 136 134* 134*  K 3.7  --  3.9 4.8 4.3 4.0  CL 105  --  107 104 102 98  CO2 26  --  23 22 22  20*  GLUCOSE 118*  --  120* 156* 157* 115*  BUN 15  --  15 26* 24* 27*  CREATININE 1.12  --  0.93 1.15 0.99 1.35*  CALCIUM 9.1  --  8.3* 8.6* 8.5* 8.8*  MG  --  1.6* 2.4 2.4 2.2 2.3  AST 37  --  40 36 33 40  ALT 22  --  25 24 23 27   ALKPHOS 31*  --  31* 29* 30* 35*  BILITOT 1.2  --  1.0 0.8 1.0 1.4*   ------------------------------------------------------------------------------------------------------------------ No results for input(s): CHOL, HDL, LDLCALC, TRIG, CHOLHDL, LDLDIRECT in the last 72 hours.  Lab Results  Component Value Date   HGBA1C  06/11/2007    5.7 (NOTE)   The ADA recommends the following therapeutic goals for glycemic   control related to Hgb A1C measurement:   Goal of Therapy:   < 7.0% Hgb A1C   Action Suggested:  > 8.0% Hgb A1C   Ref:  Diabetes Care, 22, Suppl. 1, 1999   ------------------------------------------------------------------------------------------------------------------ No results for input(s): TSH, T4TOTAL, T3FREE, THYROIDAB in the last 72 hours.  Invalid input(s): FREET3 ------------------------------------------------------------------------------------------------------------------ Recent Labs    10/26/20 0247 10/27/20 0051  FERRITIN 105 104    Coagulation profile Recent Labs  Lab 10/23/20 1129  INR 1.2    Recent Labs    10/26/20 0247 10/27/20 0051  DDIMER 1.25* 1.42*    Cardiac Enzymes No results for input(s): CKMB, TROPONINI, MYOGLOBIN in the last 168 hours.  Invalid input(s): CK ------------------------------------------------------------------------------------------------------------------    Component Value Date/Time   BNP 541.6 (H) 10/23/2020 1640   BNP 379.4 (H) 02/25/2016 0951    Micro Results Recent Results (from the past 240 hour(s))   Urine culture     Status: None   Collection Time: 10/23/20 11:19 AM   Specimen: In/Out Cath Urine  Result Value Ref Range Status   Specimen  Description IN/OUT CATH URINE  Final   Special Requests NONE  Final   Culture   Final    NO GROWTH Performed at [facility], [place] [address] [place], [place] [address]    Report Status 10/25/2020 FINAL  Final  Blood culture (routine x 2)     Status: None (Preliminary result)   Collection Time: 10/23/20 11:29 AM   Specimen: BLOOD  Result Value Ref Range Status   Specimen Description BLOOD RIGHT ANTECUBITAL  Final   Special Requests   Final    BOTTLES DRAWN AEROBIC AND ANAEROBIC Blood Culture adequate volume   Culture   Final    NO GROWTH 4 DAYS Performed at [facility], [place] [address], [place], [place] [address]    Report Status PENDING  Incomplete  Blood culture (routine x 2)     Status: None (Preliminary result)   Collection Time: 10/23/20 11:30 AM   Specimen: BLOOD  Result Value Ref Range Status   Specimen Description BLOOD LEFT ANTECUBITAL  Final   Special Requests   Final    BOTTLES DRAWN AEROBIC AND ANAEROBIC Blood Culture adequate volume   Culture   Final    NO GROWTH 4 DAYS Performed at [facility], [place] [address] [place], [place] [address]    Report Status PENDING  Incomplete  Resp Panel by RT-PCR (Flu A&B, Covid) Nasopharyngeal Swab     Status: Abnormal   Collection Time: 10/23/20 12:02 PM   Specimen: Nasopharyngeal Swab; Nasopharyngeal(NP) swabs in vial transport medium  Result Value Ref Range Status   SARS Coronavirus 2 by RT PCR POSITIVE (A) NEGATIVE Final    Comment: emailed L. Berdik RN 14:25 10/23/20 ([email or web address]) (NOTE) SARS-CoV-2 target nucleic acids are DETECTED.  The SARS-CoV-2 RNA is generally detectable in upper respiratory specimens during the acute phase of infection. Positive results are indicative of the presence of the identified virus, but do not rule out bacterial infection or  co-infection with other pathogens not detected by the test. Clinical correlation with patient history and other diagnostic information is necessary to determine patient infection status. The expected result is Negative.  Fact Sheet for Patients: EntrepreneurPulse.com.au  Fact Sheet for Healthcare Providers: IncredibleEmployment.be  This test is not yet approved or cleared by the [place] FDA and  has been authorized for detection and/or diagnosis of SARS-CoV-2 by FDA under an Emergency Use Authorization (EUA).  This EUA will remain in effect (meaning this test can be used) for the duration of  the COVID- 19 declaration under Section 564(b)(1) of the Act, 21 U.S.C. section 360bbb-3(b)(1), unless the authorization is terminated or revoked sooner.     Influenza A by PCR NEGATIVE NEGATIVE Final   Influenza B by PCR NEGATIVE NEGATIVE Final    Comment: (NOTE) The Xpert Xpress SARS-CoV-2/FLU/RSV plus assay is intended as an aid in the diagnosis of influenza from Nasopharyngeal swab specimens and should not be used as a sole basis for treatment. Nasal washings and aspirates are unacceptable for Xpert Xpress SARS-CoV-2/FLU/RSV testing.  Fact Sheet for Patients: EntrepreneurPulse.com.au  Fact Sheet for Healthcare Providers: IncredibleEmployment.be  This test is not yet approved or cleared by the [place] FDA and has been authorized for detection and/or diagnosis of SARS-CoV-2 by FDA under an Emergency Use Authorization (EUA). This EUA will remain in effect (meaning this test can be used) for the duration of the COVID-19 declaration under Section 564(b)(1) of the Act, 21 U.S.C. section 360bbb-3(b)(1), unless the authorization is  terminated or revoked.  Performed at [facility], [place] [address] [place], [place] [address]     Radiology Reports DG Chest [place] 1 View  Result Date:  10/23/2020 CLINICAL DATA:  Questionable sepsis. EXAM: PORTABLE CHEST 1 VIEW COMPARISON:  October 14, 2013. FINDINGS: Similar mildly enlarged cardiac silhouette with median sternotomy. Pulmonary vascular congestion. Left subclavian approach cardiac rhythm maintenance device. Aortic atherosclerosis. Streaky left basilar opacities. No confluent consolidation. No visible pleural effusions or pneumothorax. No acute osseous abnormality. IMPRESSION: 1. Low lung volumes with streaky left basilar opacities, which may represent atelectasis, aspiration, and/or pneumonia. No confluent consolidation. 2. Mild cardiomegaly and pulmonary vascular congestion. Electronically Signed   By: Margaretha Sheffield MD   On: 10/23/2020 11:54   DG Chest Port 1V same Day  Result Date: 10/24/2020 CLINICAL DATA:  Reported COVID-19 positive EXAM: PORTABLE CHEST 1 VIEW COMPARISON:  October 23, 2020 FINDINGS: There is ill-defined opacity in the right base and left mid lung regions. Left midlung ill-defined opacity is partly obscured by pacemaker. There is mild left base atelectasis. No consolidation. Heart is upper normal in size with pulmonary vascularity normal. Pacemaker leads are attached to the right atrium and right ventricle. Patient is status post coronary artery bypass grafting. There is aortic atherosclerosis. No adenopathy no evident bone lesions. IMPRESSION: Areas of airspace opacity in the right base and left mid lung regions which potentially may represent atypical organism pneumonia, particularly given the clinical history. Mild left base atelectasis. No consolidation. Heart upper normal in size, stable. Pacemaker leads attached to right atrium and right ventricle. Status post coronary artery bypass grafting. Aortic Atherosclerosis (ICD10-I70.0). Electronically Signed   By: Lowella Grip III M.D.   On: 10/24/2020 09:53   ECHOCARDIOGRAM COMPLETE  Result Date: 10/25/2020    ECHOCARDIOGRAM REPORT   Patient Name:   MARCHELLO ROTHGEB Date of Exam: 10/25/2020 Medical Rec #:  [id number]      Height:       69.0 in Accession #:    [id number]     Weight:       136.7 lb Date of Birth:  December 31, 1931      BSA:          1.757 m Patient Age:    31 years       BP:           101/83 mmHg Patient Gender: M              HR:           100 bpm. Exam Location:  Inpatient Procedure: 2D Echo, Cardiac Doppler and Color Doppler Indications:    I48.91* Unspeicified atrial fibrillation  History:        Patient has prior history of Echocardiogram examinations, most                 recent 01/04/2016. Prior CABG. Covid19 positive.  Sonographer:    Merrie Roof RDCS Referring Phys: [place]  1. Left ventricular ejection fraction, by estimation, is 25 to 30%. The left ventricle has severely decreased function. The left ventricle demonstrates global hypokinesis. Left ventricular diastolic function could not be evaluated. There is severe akinesis of the left ventricular, basal-mid inferior and inferolateral walls. Incoordinate (paced) septal motion.  2. Right ventricular systolic function is moderately reduced. The right ventricular size is mildly enlarged. There is severely elevated pulmonary artery systolic pressure. The estimated right ventricular systolic pressure is 38.9 mmHg.  3. Left atrial size  was massively dilated.  4. Right atrial size was moderately dilated.  5. Mild mitral subvalvular calcification.  6. Mild mitral subvalvular thickening/fibrosis.  7. Tethering of the mitral valve posterior leaflet.  8. The mitral valve is abnormal. Severe mitral valve regurgitation.  9. The tricuspid valve is abnormal. Tricuspid valve regurgitation is moderate. 10. The aortic valve is tricuspid. Aortic valve regurgitation is mild. Mild aortic valve sclerosis is present, with no evidence of aortic valve stenosis. 11. Moderately dilated pulmonary artery. 12. The inferior vena cava is dilated in size with <50% respiratory variability, suggesting right  atrial pressure of 15 mmHg. Comparison(s): Changes from prior study are noted. 01/04/2016: LVEF 40-45%, pacer wire, mild AI, moderate MR, moderate LAE, mild RAE. FINDINGS  Left Ventricle: Left ventricular ejection fraction, by estimation, is 25 to 30%. The left ventricle has severely decreased function. The left ventricle demonstrates global hypokinesis. Severe akinesis of the left ventricular, basal-mid inferior wall and  inferolateral wall. The left ventricular internal cavity size was normal in size. There is no left ventricular hypertrophy. Abnormal (paradoxical) septal motion, consistent with RV pacemaker. Left ventricular diastolic function could not be evaluated due to atrial fibrillation. Left ventricular diastolic function could not be evaluated. Right Ventricle: The right ventricular size is mildly enlarged. No increase in right ventricular wall thickness. Right ventricular systolic function is moderately reduced. There is severely elevated pulmonary artery systolic pressure. The tricuspid regurgitant velocity is 3.87 m/s, and with an assumed right atrial pressure of 15 mmHg, the estimated right ventricular systolic pressure is 70.6 mmHg. Left Atrium: Left atrial size was massively dilated. Right Atrium: Right atrial size was moderately dilated. Pericardium: There is no evidence of pericardial effusion. Mitral Valve: The mitral valve is abnormal. There is mild thickening of the mitral valve leaflet(s). Mild mitral subvalvular calcification. Mild mitral subvalvular thickening/fibrosis. Tethering involving the posterior leaflet is noted. Severe mitral valve regurgitation, with eccentric posteriorly directed jet. Tricuspid Valve: The tricuspid valve is abnormal. Tricuspid valve regurgitation is moderate. Aortic Valve: The aortic valve is tricuspid. Aortic valve regurgitation is mild. Mild aortic valve sclerosis is present, with no evidence of aortic valve stenosis. Pulmonic Valve: The pulmonic valve was grossly  normal. Pulmonic valve regurgitation is mild. Aorta: The aortic root and ascending aorta are structurally normal, with no evidence of dilitation. Pulmonary Artery: The pulmonary artery is moderately dilated. Venous: The inferior vena cava is dilated in size with less than 50% respiratory variability, suggesting right atrial pressure of 15 mmHg. IAS/Shunts: No atrial level shunt detected by color flow Doppler. Additional Comments: A pacer wire is visualized.  LEFT VENTRICLE PLAX 2D LVIDd:         5.40 cm LVIDs:         4.80 cm LV PW:         0.90 cm LV IVS:        1.00 cm  RIGHT VENTRICLE         IVC TAPSE (M-mode): 1.8 cm  IVC diam: 2.00 cm LEFT ATRIUM              Index        RIGHT ATRIUM           Index LA diam:        4.60 cm  2.62 cm/m   RA Area:     23.50 cm LA Vol (A2C):   184.0 ml 104.70 ml/m RA Volume:   71.40 ml  40.63 ml/m LA Vol (A4C):   161.0 ml 91.61  ml/m LA Biplane Vol: 170.0 ml 96.73 ml/m  AORTIC VALVE LVOT Vmax:   73.10 cm/s LVOT Vmean:  44.100 cm/s LVOT VTI:    0.122 m  AORTA Ao Root diam: 3.60 cm Ao Asc diam:  3.40 cm MR Peak grad:    136.9 mmHg  TRICUSPID VALVE MR Mean grad:    60.0 mmHg   TR Peak grad:   59.9 mmHg MR Vmax:         585.00 cm/s TR Vmax:        387.00 cm/s MR Vmean:        349.0 cm/s MR PISA:         3.08 cm    SHUNTS MR PISA Eff ROA: 17 mm      Systemic VTI: 0.12 m MR PISA Radius:  0.70 cm Lyman Bishop MD Electronically signed by Lyman Bishop MD Signature Date/Time: 10/25/2020/4:41:18 PM    Final

## 2020-10-28 LAB — CBC WITH DIFFERENTIAL/PLATELET
Abs Immature Granulocytes: 0.05 10*3/uL (ref 0.00–0.07)
Basophils Absolute: 0 10*3/uL (ref 0.0–0.1)
Basophils Relative: 0 %
Eosinophils Absolute: 0 10*3/uL (ref 0.0–0.5)
Eosinophils Relative: 0 %
HCT: 26 % — ABNORMAL LOW (ref 39.0–52.0)
Hemoglobin: 8.7 g/dL — ABNORMAL LOW (ref 13.0–17.0)
Immature Granulocytes: 1 %
Lymphocytes Relative: 3 %
Lymphs Abs: 0.3 10*3/uL — ABNORMAL LOW (ref 0.7–4.0)
MCH: 31.3 pg (ref 26.0–34.0)
MCHC: 33.5 g/dL (ref 30.0–36.0)
MCV: 93.5 fL (ref 80.0–100.0)
Monocytes Absolute: 0.5 10*3/uL (ref 0.1–1.0)
Monocytes Relative: 7 %
Neutro Abs: 6.9 10*3/uL (ref 1.7–7.7)
Neutrophils Relative %: 89 %
Platelets: 134 10*3/uL — ABNORMAL LOW (ref 150–400)
RBC: 2.78 MIL/uL — ABNORMAL LOW (ref 4.22–5.81)
RDW: 13.9 % (ref 11.5–15.5)
WBC: 7.7 10*3/uL (ref 4.0–10.5)
nRBC: 0 % (ref 0.0–0.2)

## 2020-10-28 LAB — CULTURE, BLOOD (ROUTINE X 2)
Culture: NO GROWTH
Culture: NO GROWTH
Special Requests: ADEQUATE
Special Requests: ADEQUATE

## 2020-10-28 LAB — D-DIMER, QUANTITATIVE
D-Dimer, Quant: 0.85 ug/mL-FEU — ABNORMAL HIGH (ref 0.00–0.50)
D-Dimer, Quant: 1.01 ug/mL-FEU — ABNORMAL HIGH (ref 0.00–0.50)

## 2020-10-28 LAB — CBC
HCT: 27.6 % — ABNORMAL LOW (ref 39.0–52.0)
Hemoglobin: 9 g/dL — ABNORMAL LOW (ref 13.0–17.0)
MCH: 31.1 pg (ref 26.0–34.0)
MCHC: 32.6 g/dL (ref 30.0–36.0)
MCV: 95.5 fL (ref 80.0–100.0)
Platelets: 133 10*3/uL — ABNORMAL LOW (ref 150–400)
RBC: 2.89 MIL/uL — ABNORMAL LOW (ref 4.22–5.81)
RDW: 13.7 % (ref 11.5–15.5)
WBC: 7.5 10*3/uL (ref 4.0–10.5)
nRBC: 0 % (ref 0.0–0.2)

## 2020-10-28 LAB — COMPREHENSIVE METABOLIC PANEL
ALT: 30 U/L (ref 0–44)
AST: 44 U/L — ABNORMAL HIGH (ref 15–41)
Albumin: 2.8 g/dL — ABNORMAL LOW (ref 3.5–5.0)
Alkaline Phosphatase: 28 U/L — ABNORMAL LOW (ref 38–126)
Anion gap: 11 (ref 5–15)
BUN: 27 mg/dL — ABNORMAL HIGH (ref 8–23)
CO2: 22 mmol/L (ref 22–32)
Calcium: 8.6 mg/dL — ABNORMAL LOW (ref 8.9–10.3)
Chloride: 102 mmol/L (ref 98–111)
Creatinine, Ser: 1.2 mg/dL (ref 0.61–1.24)
GFR, Estimated: 59 mL/min — ABNORMAL LOW (ref >60)
Glucose, Bld: 83 mg/dL (ref 70–99)
Potassium: 4.4 mmol/L (ref 3.5–5.1)
Sodium: 135 mmol/L (ref 135–145)
Total Bilirubin: 1.3 mg/dL — ABNORMAL HIGH (ref 0.3–1.2)
Total Protein: 4.9 g/dL — ABNORMAL LOW (ref 6.5–8.1)

## 2020-10-28 LAB — MAGNESIUM: Magnesium: 2.2 mg/dL (ref 1.7–2.4)

## 2020-10-28 LAB — FERRITIN: Ferritin: 84 ng/mL (ref 24–336)

## 2020-10-28 LAB — C-REACTIVE PROTEIN: CRP: 3.7 mg/dL — ABNORMAL HIGH (ref <1.0)

## 2020-10-28 MED ORDER — PANTOPRAZOLE SODIUM 40 MG PO TBEC
40.0000 mg | DELAYED_RELEASE_TABLET | Freq: Two times a day (BID) | ORAL | Status: DC
  Administered 2020-10-28 – 2020-11-04 (×16): 40 mg via ORAL
  Filled 2020-10-28 (×10): qty 1
  Filled 2020-10-28 (×2): qty 2
  Filled 2020-10-28 (×5): qty 1

## 2020-10-28 MED ORDER — METOPROLOL TARTRATE 50 MG PO TABS
50.0000 mg | ORAL_TABLET | Freq: Two times a day (BID) | ORAL | Status: DC
  Administered 2020-10-28 – 2020-11-03 (×9): 50 mg via ORAL
  Filled 2020-10-28 (×13): qty 1

## 2020-10-28 MED ORDER — PREDNISONE 20 MG PO TABS
20.0000 mg | ORAL_TABLET | Freq: Every day | ORAL | Status: DC
  Administered 2020-10-29: 20 mg via ORAL
  Filled 2020-10-28: qty 1

## 2020-10-28 NOTE — Progress Notes (Signed)
Called patient's daughter [name] to give her an update about patient status throughout the night. Uneventful night. Was given ativan due to restlessness and pulling at tubes and lines. Placed on 3 liters of oxygen throughout the night due to low oxygenation. Daughter didn't have any additional questions after receiving brief update.

## 2020-10-28 NOTE — Progress Notes (Signed)
PROGRESS NOTE                                                                                                                                                                                                             Patient Demographics:    [name] [name], is a 86 y.o. male, DOB - 06-05-1932, YQI:[id number]  Outpatient Primary MD for the patient is Crist Infante, MD   Admit date - 10/23/2020   LOS - 5  Chief Complaint  Patient presents with  . Weakness       Brief Narrative: Patient is a 86 y.o. male with PMHx of CAD s/p CABG in 2002, s/p PPM implantation, chronic systolic heart failure, chronic atrial fibrillation-not on anticoagulation (history of recurrent GI bleeding), history of colon cancer, history of recurrent GI bleeding-who presented with a 3-4-day history of weakness, dark appearing stools.  In the emergency room-he was found to be febrile.  While in the ED-he had an episode of wide-complex tachycardia associated with hypotension-required emergent cardioversion.  He was also found to be COVID-19 positive and acute urinary retention.  He was subsequently admitted to the hospitalist service.  See below for further details.  COVID-19 vaccinated status: Vaccinated (but not a booster)  Significant Events: 11/26>> Admit to [facility] for generalized weakness, fever, and dark-colored stools-COVID-19 positive.  Had episode of wide-complex tachycardia with hypotension requiring emergent cardioversion. 11/26>> acute urinary retention-difficult Foley catheter -18 French coud catheter placed by urology.  Significant studies: 11/26>>Chest x-ray: Streaky left basilar opacities, mild cardiomegaly and pulmonary vascular congestion. 11/27>> chest x-ray: Bilateral patchy opacities. 11/28>> Echo: EF 25-30%.  RV systolic function moderately reduced.  COVID-19 medications: Steroids: 11/26>> Remdesivir:  11/26>>11/30  Antibiotics: None  Microbiology data: 11/26 >>blood culture: No growth  Procedures: 11/26>> cardioversion by ED MD for wide-complex tachycardia (presumed V. tach) associated with hypotension.  Consults: Cardiology, GI, urology  DVT prophylaxis: SCDs Start: 10/23/20 1506   Subjective:   Less confused compared to yesterday-heart rate stable.  No overt GI bleeding.  Following commands.   Assessment  & Plan :   Acute Hypoxic Resp Failure due to Covid 19 Viral pneumonia: Improved-remains on room air-we will complete Remdesivir today-continue tapering steroids.  Fever: afebrile O2 requirements:  SpO2: 97 % O2 Flow Rate (L/min): 3 L/min   COVID-19 Labs: Recent Labs    10/26/20  6283 10/26/20 0247 10/27/20 0051 10/28/20 0201 10/28/20 0425  DDIMER 1.25*   < > 1.42* 0.85* 1.01*  FERRITIN 105  --  104  --  84  CRP 9.9*  --  6.6*  --  3.7*   < > = values in this interval not displayed.       Component Value Date/Time   BNP 541.6 (H) 10/23/2020 1640   BNP 379.4 (H) 02/25/2016 0951    Recent Labs  Lab 10/23/20 1640  PROCALCITON 0.63    Lab Results  Component Value Date   SARSCOV2NAA POSITIVE (A) 10/23/2020     Prone/Incentive Spirometry: encouraged  incentive spirometry use 3-4/hour.  Sepsis: Likely secondary to COVID-19 infection-sepsis physiology has resolved. Cultures negative so far.  Wide-complex tachycardia: Reviewed ED/cardiology notes-concern for V. tach-cardioverted by EDMD on admission on 11/26.  Subsequently started on amiodarone infusion that has been since discontinued.  Currently stable on beta-blocker and digoxin.  Chronic atrial fibrillation with RVR: Rate better controlled today-amiodarone infusion discontinued on 11/30-rate controlled with beta-blocker/digoxin.  Not a candidate for anticoagulation.  Cardiology following.    Chronic systolic heart failure: Euvolemic-does not require diuretics.  GI bleeding: No overt GI bleeding  for the past several days.  Hemoglobin stable-GI signed off.  Continue PPI.  Acute urinary retention: S/p coud catheter insertion by urology on 11/26-recommendations-continue Flomax-and keep catheter for 1 week prior to initiating a voiding trial.  Deconditioning: Secondary to acute illness-superimposed on some amount of chronic debility.  Plans are for either SNF versus home health on discharge.  Social worker following.  Delirium: Per patient's daughter-patient does get confused in the hospital.  This morning he is less confused-mental status continues to wax and wane.  Steroids being tapered down further.  Continue to maintain delirium precautions.    Goals of care: Discussed with patient earlier during the hospital stay when he was a little bit more coherent-and subsequently with patient's daughter multiple times-continue medical treatment-DNI in place-okay for CPR/defibrillation/ACLS medications.    RN pressure injury documentation: Pressure Injury 10/23/20 Coccyx (Active)  10/23/20 1600  Location: Coccyx  Location Orientation:   Staging:   Wound Description (Comments):   Present on Admission:     GI prophylaxis: PPI  ABG:    Component Value Date/Time   HCO3 22.8 06/11/2007 1615   TCO2 24 06/11/2007 1615   ACIDBASEDEF 1.0 06/11/2007 1615    Vent Settings: N/A    Condition - Extremely Guarded  Family Communication  : Daughter [name]  [phone]) on 12/1  Diet :  Diet Order            DIET SOFT Room service appropriate? No; Fluid consistency: Thin  Diet effective now                  Disposition Plan  :   Status is: Inpatient  Remains inpatient appropriate because:Inpatient level of care appropriate due to severity of illness   Dispo: The patient is from: Home              Anticipated d/c is to: TBD              Anticipated d/c date is: > 1 days              Patient currently is medically stable to d/c.  Barriers to discharge: Awaiting SNF  bed  Antimicorbials  :    Anti-infectives (From admission, onward)   Start     Dose/Rate Route Frequency Ordered Stop  10/24/20 1000  remdesivir 100 mg in sodium chloride 0.9 % 100 mL IVPB  Status:  Discontinued       "Followed by" Linked Group Details   100 mg 200 mL/hr over 30 Minutes Intravenous Daily 10/23/20 1509 10/26/20 1111   10/23/20 1600  remdesivir 200 mg in sodium chloride 0.9% 250 mL IVPB       "Followed by" Linked Group Details   200 mg 580 mL/hr over 30 Minutes Intravenous Once 10/23/20 1509 10/23/20 1922   10/23/20 1215  cefTRIAXone (ROCEPHIN) 1 g in sodium chloride 0.9 % 100 mL IVPB        1 g 200 mL/hr over 30 Minutes Intravenous  Once 10/23/20 1206 10/23/20 1307   10/23/20 1215  azithromycin (ZITHROMAX) 500 mg in sodium chloride 0.9 % 250 mL IVPB        500 mg 250 mL/hr over 60 Minutes Intravenous  Once 10/23/20 1206 10/23/20 1341      Inpatient Medications  Scheduled Meds: . albuterol  2 puff Inhalation Q6H  . vitamin C  500 mg Oral Daily  . Chlorhexidine Gluconate Cloth  6 each Topical Daily  . cholecalciferol  1,000 Units Oral Daily  . digoxin  0.0625 mg Oral Daily  . metoprolol tartrate  25 mg Oral TID  . multivitamin with minerals  1 tablet Oral Daily  . pantoprazole  40 mg Oral BID  . predniSONE  30 mg Oral Daily  . sodium chloride flush  3 mL Intravenous Q12H  . tamsulosin  0.4 mg Oral Daily  . vitamin B-12  1,000 mcg Oral Daily  . zinc sulfate  220 mg Oral Daily   Continuous Infusions:  PRN Meds:.acetaminophen, albuterol, chlorpheniramine-HYDROcodone, diphenoxylate-atropine, guaiFENesin-dextromethorphan, LORazepam   Time Spent in minutes  25   See all Orders from today for further details   Oren Binet M.D on 10/28/2020 at 11:54 AM  To page go to [email or web address] - use universal password  [facility] -  Office  [phone]    Objective:   Vitals:   10/28/20 0002 10/28/20 0400 10/28/20 0705 10/28/20 0952  BP: 133/73 (!)  120/96 (!) 132/95   Pulse: 65 64 64 75  Resp: 20 17 18    Temp: 98.1 F (36.7 C) 98.6 F (37 C) 98.4 F (36.9 C)   TempSrc: Axillary Axillary Axillary   SpO2: 94% 93% 97%   Weight:      Height:        Wt Readings from Last 3 Encounters:  10/23/20 62 kg  06/15/20 62.1 kg  01/08/20 63.5 kg     Intake/Output Summary (Last 24 hours) at 10/28/2020 1154 Last data filed at 10/28/2020 0521 Gross per 24 hour  Intake 113.46 ml  Output 700 ml  Net -586.54 ml     Physical Exam Gen Exam: Awake/alert-minimally confused-better than yesterday. HEENT:atraumatic, normocephalic Chest: B/L clear to auscultation anteriorly CVS:S1S2 irregular Abdomen:soft non tender, non distended Extremities:no edema Neurology: Non focal Skin: no rash   Data Review:    CBC Recent Labs  Lab 10/24/20 0341 10/24/20 1046 10/25/20 0223 10/25/20 0941 10/26/20 0247 10/26/20 1510 10/27/20 0051 10/27/20 1032 10/27/20 1818 10/28/20 0201 10/28/20 0425  WBC 11.6*   < > 12.2*   < > 11.9*   < > 9.8 9.9 9.5 7.5 7.7  HGB 9.3*   < > 9.6*   < > 9.5*   < > 9.8* 9.5* 9.2* 9.0* 8.7*  HCT 28.7*   < > 29.2*   < > 29.3*   < >  29.4* 29.0* 27.8* 27.6* 26.0*  PLT 125*   < > 137*   < > 151   < > 177 171 155 133* 134*  MCV 97.3   < > 96.7   < > 95.4   < > 94.5 93.9 94.2 95.5 93.5  MCH 31.5   < > 31.8   < > 30.9   < > 31.5 30.7 31.2 31.1 31.3  MCHC 32.4   < > 32.9   < > 32.4   < > 33.3 32.8 33.1 32.6 33.5  RDW 14.5   < > 14.5   < > 14.3   < > 14.1 13.9 14.0 13.7 13.9  LYMPHSABS 0.5*  --  0.5*  --  0.4*  --  0.2*  --   --   --  0.3*  MONOABS 0.4  --  0.5  --  0.4  --  0.6  --   --   --  0.5  EOSABS 0.0  --  0.0  --  0.0  --  0.0  --   --   --  0.0  BASOSABS 0.0  --  0.0  --  0.0  --  0.0  --   --   --  0.0   < > = values in this interval not displayed.    Chemistries  Recent Labs  Lab 10/24/20 0341 10/25/20 0223 10/26/20 0247 10/27/20 0051 10/28/20 0425  NA 140 136 134* 134* 135  K 3.9 4.8 4.3 4.0 4.4  CL  107 104 102 98 102  CO2 23 22 22  20* 22  GLUCOSE 120* 156* 157* 115* 83  BUN 15 26* 24* 27* 27*  CREATININE 0.93 1.15 0.99 1.35* 1.20  CALCIUM 8.3* 8.6* 8.5* 8.8* 8.6*  MG 2.4 2.4 2.2 2.3 2.2  AST 40 36 33 40 44*  ALT 25 24 23 27 30   ALKPHOS 31* 29* 30* 35* 28*  BILITOT 1.0 0.8 1.0 1.4* 1.3*   ------------------------------------------------------------------------------------------------------------------ No results for input(s): CHOL, HDL, LDLCALC, TRIG, CHOLHDL, LDLDIRECT in the last 72 hours.  Lab Results  Component Value Date   HGBA1C  06/11/2007    5.7 (NOTE)   The ADA recommends the following therapeutic goals for glycemic   control related to Hgb A1C measurement:   Goal of Therapy:   < 7.0% Hgb A1C   Action Suggested:  > 8.0% Hgb A1C   Ref:  Diabetes Care, 22, Suppl. 1, 1999   ------------------------------------------------------------------------------------------------------------------ No results for input(s): TSH, T4TOTAL, T3FREE, THYROIDAB in the last 72 hours.  Invalid input(s): FREET3 ------------------------------------------------------------------------------------------------------------------ Recent Labs    10/27/20 0051 10/28/20 0425  FERRITIN 104 84    Coagulation profile Recent Labs  Lab 10/23/20 1129  INR 1.2    Recent Labs    10/28/20 0201 10/28/20 0425  DDIMER 0.85* 1.01*    Cardiac Enzymes No results for input(s): CKMB, TROPONINI, MYOGLOBIN in the last 168 hours.  Invalid input(s): CK ------------------------------------------------------------------------------------------------------------------    Component Value Date/Time   BNP 541.6 (H) 10/23/2020 1640   BNP 379.4 (H) 02/25/2016 0951    Micro Results Recent Results (from the past 240 hour(s))  Urine culture     Status: None   Collection Time: 10/23/20 11:19 AM   Specimen: In/Out Cath Urine  Result Value Ref Range Status   Specimen Description IN/OUT CATH URINE  Final    Special Requests NONE  Final   Culture   Final    NO GROWTH Performed at [facility]  Lab, [address], [place], [place] [address]    Report Status 10/25/2020 FINAL  Final  Blood culture (routine x 2)     Status: None   Collection Time: 10/23/20 11:29 AM   Specimen: BLOOD  Result Value Ref Range Status   Specimen Description BLOOD RIGHT ANTECUBITAL  Final   Special Requests   Final    BOTTLES DRAWN AEROBIC AND ANAEROBIC Blood Culture adequate volume   Culture   Final    NO GROWTH 5 DAYS Performed at [facility], [place] [address], [place], [place] [address]    Report Status 10/28/2020 FINAL  Final  Blood culture (routine x 2)     Status: None   Collection Time: 10/23/20 11:30 AM   Specimen: BLOOD  Result Value Ref Range Status   Specimen Description BLOOD LEFT ANTECUBITAL  Final   Special Requests   Final    BOTTLES DRAWN AEROBIC AND ANAEROBIC Blood Culture adequate volume   Culture   Final    NO GROWTH 5 DAYS Performed at [facility], [place] [address], [place], [place] [address]    Report Status 10/28/2020 FINAL  Final  Resp Panel by RT-PCR (Flu A&B, Covid) Nasopharyngeal Swab     Status: Abnormal   Collection Time: 10/23/20 12:02 PM   Specimen: Nasopharyngeal Swab; Nasopharyngeal(NP) swabs in vial transport medium  Result Value Ref Range Status   SARS Coronavirus 2 by RT PCR POSITIVE (A) NEGATIVE Final    Comment: emailed L. Berdik RN 14:25 10/23/20 ([email or web address]) (NOTE) SARS-CoV-2 target nucleic acids are DETECTED.  The SARS-CoV-2 RNA is generally detectable in upper respiratory specimens during the acute phase of infection. Positive results are indicative of the presence of the identified virus, but do not rule out bacterial infection or co-infection with other pathogens not detected by the test. Clinical correlation with patient history and other diagnostic information is necessary to determine patient infection status. The expected result is  Negative.  Fact Sheet for Patients: EntrepreneurPulse.com.au  Fact Sheet for Healthcare Providers: IncredibleEmployment.be  This test is not yet approved or cleared by the [place] FDA and  has been authorized for detection and/or diagnosis of SARS-CoV-2 by FDA under an Emergency Use Authorization (EUA).  This EUA will remain in effect (meaning this test can be used) for the duration of  the COVID- 19 declaration under Section 564(b)(1) of the Act, 21 U.S.C. section 360bbb-3(b)(1), unless the authorization is terminated or revoked sooner.     Influenza A by PCR NEGATIVE NEGATIVE Final   Influenza B by PCR NEGATIVE NEGATIVE Final    Comment: (NOTE) The Xpert Xpress SARS-CoV-2/FLU/RSV plus assay is intended as an aid in the diagnosis of influenza from Nasopharyngeal swab specimens and should not be used as a sole basis for treatment. Nasal washings and aspirates are unacceptable for Xpert Xpress SARS-CoV-2/FLU/RSV testing.  Fact Sheet for Patients: EntrepreneurPulse.com.au  Fact Sheet for Healthcare Providers: IncredibleEmployment.be  This test is not yet approved or cleared by the [place] FDA and has been authorized for detection and/or diagnosis of SARS-CoV-2 by FDA under an Emergency Use Authorization (EUA). This EUA will remain in effect (meaning this test can be used) for the duration of the COVID-19 declaration under Section 564(b)(1) of the Act, 21 U.S.C. section 360bbb-3(b)(1), unless the authorization is terminated or revoked.  Performed at [facility], [place] [address], [place], [place] [address]     Radiology Reports DG Chest [place] 1 View  Result Date:  10/23/2020 CLINICAL DATA:  Questionable sepsis. EXAM: PORTABLE CHEST 1 VIEW COMPARISON:  October 14, 2013. FINDINGS: Similar mildly enlarged cardiac silhouette with median sternotomy. Pulmonary vascular congestion. Left  subclavian approach cardiac rhythm maintenance device. Aortic atherosclerosis. Streaky left basilar opacities. No confluent consolidation. No visible pleural effusions or pneumothorax. No acute osseous abnormality. IMPRESSION: 1. Low lung volumes with streaky left basilar opacities, which may represent atelectasis, aspiration, and/or pneumonia. No confluent consolidation. 2. Mild cardiomegaly and pulmonary vascular congestion. Electronically Signed   By: Margaretha Sheffield MD   On: 10/23/2020 11:54   DG Chest Port 1V same Day  Result Date: 10/24/2020 CLINICAL DATA:  Reported COVID-19 positive EXAM: PORTABLE CHEST 1 VIEW COMPARISON:  October 23, 2020 FINDINGS: There is ill-defined opacity in the right base and left mid lung regions. Left midlung ill-defined opacity is partly obscured by pacemaker. There is mild left base atelectasis. No consolidation. Heart is upper normal in size with pulmonary vascularity normal. Pacemaker leads are attached to the right atrium and right ventricle. Patient is status post coronary artery bypass grafting. There is aortic atherosclerosis. No adenopathy no evident bone lesions. IMPRESSION: Areas of airspace opacity in the right base and left mid lung regions which potentially may represent atypical organism pneumonia, particularly given the clinical history. Mild left base atelectasis. No consolidation. Heart upper normal in size, stable. Pacemaker leads attached to right atrium and right ventricle. Status post coronary artery bypass grafting. Aortic Atherosclerosis (ICD10-I70.0). Electronically Signed   By: Lowella Grip III M.D.   On: 10/24/2020 09:53   ECHOCARDIOGRAM COMPLETE  Result Date: 10/25/2020    ECHOCARDIOGRAM REPORT   Patient Name:   STYLES FAMBRO Date of Exam: 10/25/2020 Medical Rec #:  [id number]      Height:       69.0 in Accession #:    [id number]     Weight:       136.7 lb Date of Birth:  November 21, 1932      BSA:          1.757 m Patient Age:    82 years        BP:           101/83 mmHg Patient Gender: M              HR:           100 bpm. Exam Location:  Inpatient Procedure: 2D Echo, Cardiac Doppler and Color Doppler Indications:    I48.91* Unspeicified atrial fibrillation  History:        Patient has prior history of Echocardiogram examinations, most                 recent 01/04/2016. Prior CABG. Covid19 positive.  Sonographer:    Merrie Roof RDCS Referring Phys: [place]  1. Left ventricular ejection fraction, by estimation, is 25 to 30%. The left ventricle has severely decreased function. The left ventricle demonstrates global hypokinesis. Left ventricular diastolic function could not be evaluated. There is severe akinesis of the left ventricular, basal-mid inferior and inferolateral walls. Incoordinate (paced) septal motion.  2. Right ventricular systolic function is moderately reduced. The right ventricular size is mildly enlarged. There is severely elevated pulmonary artery systolic pressure. The estimated right ventricular systolic pressure is 06.3 mmHg.  3. Left atrial size was massively dilated.  4. Right atrial size was moderately dilated.  5. Mild mitral subvalvular calcification.  6. Mild mitral subvalvular thickening/fibrosis.  7. Tethering of the mitral valve posterior  leaflet.  8. The mitral valve is abnormal. Severe mitral valve regurgitation.  9. The tricuspid valve is abnormal. Tricuspid valve regurgitation is moderate. 10. The aortic valve is tricuspid. Aortic valve regurgitation is mild. Mild aortic valve sclerosis is present, with no evidence of aortic valve stenosis. 11. Moderately dilated pulmonary artery. 12. The inferior vena cava is dilated in size with <50% respiratory variability, suggesting right atrial pressure of 15 mmHg. Comparison(s): Changes from prior study are noted. 01/04/2016: LVEF 40-45%, pacer wire, mild AI, moderate MR, moderate LAE, mild RAE. FINDINGS  Left Ventricle: Left ventricular ejection fraction, by  estimation, is 25 to 30%. The left ventricle has severely decreased function. The left ventricle demonstrates global hypokinesis. Severe akinesis of the left ventricular, basal-mid inferior wall and  inferolateral wall. The left ventricular internal cavity size was normal in size. There is no left ventricular hypertrophy. Abnormal (paradoxical) septal motion, consistent with RV pacemaker. Left ventricular diastolic function could not be evaluated due to atrial fibrillation. Left ventricular diastolic function could not be evaluated. Right Ventricle: The right ventricular size is mildly enlarged. No increase in right ventricular wall thickness. Right ventricular systolic function is moderately reduced. There is severely elevated pulmonary artery systolic pressure. The tricuspid regurgitant velocity is 3.87 m/s, and with an assumed right atrial pressure of 15 mmHg, the estimated right ventricular systolic pressure is 25.8 mmHg. Left Atrium: Left atrial size was massively dilated. Right Atrium: Right atrial size was moderately dilated. Pericardium: There is no evidence of pericardial effusion. Mitral Valve: The mitral valve is abnormal. There is mild thickening of the mitral valve leaflet(s). Mild mitral subvalvular calcification. Mild mitral subvalvular thickening/fibrosis. Tethering involving the posterior leaflet is noted. Severe mitral valve regurgitation, with eccentric posteriorly directed jet. Tricuspid Valve: The tricuspid valve is abnormal. Tricuspid valve regurgitation is moderate. Aortic Valve: The aortic valve is tricuspid. Aortic valve regurgitation is mild. Mild aortic valve sclerosis is present, with no evidence of aortic valve stenosis. Pulmonic Valve: The pulmonic valve was grossly normal. Pulmonic valve regurgitation is mild. Aorta: The aortic root and ascending aorta are structurally normal, with no evidence of dilitation. Pulmonary Artery: The pulmonary artery is moderately dilated. Venous: The  inferior vena cava is dilated in size with less than 50% respiratory variability, suggesting right atrial pressure of 15 mmHg. IAS/Shunts: No atrial level shunt detected by color flow Doppler. Additional Comments: A pacer wire is visualized.  LEFT VENTRICLE PLAX 2D LVIDd:         5.40 cm LVIDs:         4.80 cm LV PW:         0.90 cm LV IVS:        1.00 cm  RIGHT VENTRICLE         IVC TAPSE (M-mode): 1.8 cm  IVC diam: 2.00 cm LEFT ATRIUM              Index        RIGHT ATRIUM           Index LA diam:        4.60 cm  2.62 cm/m   RA Area:     23.50 cm LA Vol (A2C):   184.0 ml 104.70 ml/m RA Volume:   71.40 ml  40.63 ml/m LA Vol (A4C):   161.0 ml 91.61 ml/m LA Biplane Vol: 170.0 ml 96.73 ml/m  AORTIC VALVE LVOT Vmax:   73.10 cm/s LVOT Vmean:  44.100 cm/s LVOT VTI:    0.122 m  AORTA  Ao Root diam: 3.60 cm Ao Asc diam:  3.40 cm MR Peak grad:    136.9 mmHg  TRICUSPID VALVE MR Mean grad:    60.0 mmHg   TR Peak grad:   59.9 mmHg MR Vmax:         585.00 cm/s TR Vmax:        387.00 cm/s MR Vmean:        349.0 cm/s MR PISA:         3.08 cm    SHUNTS MR PISA Eff ROA: 17 mm      Systemic VTI: 0.12 m MR PISA Radius:  0.70 cm Lyman Bishop MD Electronically signed by Lyman Bishop MD Signature Date/Time: 10/25/2020/4:41:18 PM    Final

## 2020-10-28 NOTE — Plan of Care (Signed)
  Problem: Respiratory: Goal: Will maintain a patent airway Outcome: Progressing Goal: Complications related to the disease process, condition or treatment will be avoided or minimized Outcome: Progressing   

## 2020-10-29 LAB — CBC
HCT: 30 % — ABNORMAL LOW (ref 39.0–52.0)
Hemoglobin: 9.8 g/dL — ABNORMAL LOW (ref 13.0–17.0)
MCH: 31.3 pg (ref 26.0–34.0)
MCHC: 32.7 g/dL (ref 30.0–36.0)
MCV: 95.8 fL (ref 80.0–100.0)
Platelets: 181 K/uL (ref 150–400)
RBC: 3.13 MIL/uL — ABNORMAL LOW (ref 4.22–5.81)
RDW: 13.9 % (ref 11.5–15.5)
WBC: 10.3 K/uL (ref 4.0–10.5)
nRBC: 0 % (ref 0.0–0.2)

## 2020-10-29 LAB — BASIC METABOLIC PANEL WITH GFR
Anion gap: 9 (ref 5–15)
BUN: 27 mg/dL — ABNORMAL HIGH (ref 8–23)
CO2: 25 mmol/L (ref 22–32)
Calcium: 8.6 mg/dL — ABNORMAL LOW (ref 8.9–10.3)
Chloride: 101 mmol/L (ref 98–111)
Creatinine, Ser: 1.2 mg/dL (ref 0.61–1.24)
GFR, Estimated: 59 mL/min — ABNORMAL LOW
Glucose, Bld: 114 mg/dL — ABNORMAL HIGH (ref 70–99)
Potassium: 4.1 mmol/L (ref 3.5–5.1)
Sodium: 135 mmol/L (ref 135–145)

## 2020-10-29 LAB — MAGNESIUM: Magnesium: 2.3 mg/dL (ref 1.7–2.4)

## 2020-10-29 NOTE — Progress Notes (Signed)
Removed patient's hand mits this morning without difficulty.

## 2020-10-29 NOTE — Progress Notes (Addendum)
Pt confused, attempting to climb out of bed. Pt becoming combative with staff. Pt pulling at foley catheter. Mittens applied at this time. Staff in multiple times to reorient pt without success. Bed alarm remains on. Pt states he is leaving and wants his shoes, will continue to monitor.

## 2020-10-29 NOTE — Progress Notes (Signed)
Occupational Therapy Treatment Patient Details Name: [name] [name] MRN: [id number] DOB: 1932-05-06 Today's Date: 10/29/2020    History of present illness 86 y.o. male with medical history significant of CAD s/p CABG in 2002, s/p PPM, ischemic cardiomyopathy, systolic CHF, HTN, HLD, PAF not on anticoagulation secondary to recurrent GI bleed, back surgery, and history of colon cancer presented 10/23/20 with complaints of weakness, fever, tarry stools x 3 days. +COVID pneumonia with sepsis; while in ED episode of wide-complex tachycardia with hypotension requiring emergent cardioversion.   OT comments  Pt seen for OT follow up session with focus on ADL mobility progression. Pt presents disoriented with little ability to follow purposeful commands this date. He was able to complete bed mobility at max A level. Once EOB he was able to maintain sitting balance with min A. Pt then completed 4 different trials of sit <> stands with max A. He was unable to safely advance via transfer due to difficulty following commands. Pt was attempting to resist OT support and reach for unsafe surfaces out of BOS. Pt was returned to bed with max A and required total A for repositioning. He then immediately fell asleep. RN informed of status. D/c recs remain appropriate, will continue to follow.    Follow Up Recommendations  SNF;Supervision/Assistance - 24 hour    Equipment Recommendations  3 in 1 bedside commode    Recommendations for Other Services      Precautions / Restrictions Precautions Precautions: Fall       Mobility Bed Mobility Overal bed mobility: Needs Assistance Bed Mobility: Supine to Sit     Supine to sit: Max assist     General bed mobility comments: assist to sequence and pull up trunk to sitting  Transfers Overall transfer level: Needs assistance Equipment used: 1 person hand held assist Transfers: Sit to/from Stand Sit to Stand: Max assist         General transfer comment:  attempted sit <> stand with therapist on L side to stand and pivot to recliner. Pt required max A, not following commands and reaching unsafely to surfaces out of BOS. Attempted face to face transfer 3 additional times, and pt continuing to require max A and having difficulty following motor commands. Further transfer deferred    Balance Overall balance assessment: Needs assistance Sitting-balance support: No upper extremity supported;Feet supported Sitting balance-Leahy Scale: Fair     Standing balance support: Single extremity supported;During functional activity Standing balance-Leahy Scale: Zero Standing balance comment: max A this date; poor ability to follow commands due to cognition                           ADL either performed or assessed with clinical judgement   ADL Overall ADL's : Needs assistance/impaired                 Upper Body Dressing : Maximal assistance;Sitting Upper Body Dressing Details (indicate cue type and reason): attempting to take gown on and off and disoriented     Toilet Transfer: Maximal assistance;Total assistance Toilet Transfer Details (indicate cue type and reason): difficulty time following commands, unsteady and at increased fall risk           General ADL Comments: Pt presents disoriented and difficulty following commands. Ultimately max-total A for all ADL this date due to cognitive status     Vision Baseline Vision/History: Wears glasses Wears Glasses: At all times Patient Visual Report: No change from baseline  Perception     Praxis      Cognition Arousal/Alertness: Awake/alert Behavior During Therapy: Restless Overall Cognitive Status: No family/caregiver present to determine baseline cognitive functioning Area of Impairment: Orientation;Memory;Safety/judgement;Awareness;Attention;Following commands;Problem solving                 Orientation Level: Place;Time;Situation Current Attention Level:  Focused;Sustained Memory: Decreased recall of precautions Following Commands: Follows one step commands inconsistently Safety/Judgement: Decreased awareness of safety;Decreased awareness of deficits Awareness: Intellectual Problem Solving: Difficulty sequencing;Requires verbal cues;Requires tactile cues General Comments: decreased orientation, often making nonsensical statements and difficulty following commands. This decreased ability to safely engage in ADL/transfer        Exercises     Shoulder Instructions       General Comments      Pertinent Vitals/ Pain       Pain Assessment: No/denies pain  Home Living                                          Prior Functioning/Environment              Frequency  Min 2X/week        Progress Toward Goals  OT Goals(current goals can now be found in the care plan section)  Progress towards OT goals: Not progressing toward goals - comment (decreased cognition)  Acute Rehab OT Goals OT Goal Formulation: Patient unable to participate in goal setting Time For Goal Achievement: 11/10/20 Potential to Achieve Goals: Good  Plan Discharge plan remains appropriate    Co-evaluation                 AM-PAC OT "6 Clicks" Daily Activity     Outcome Measure   Help from another person eating meals?: A Lot Help from another person taking care of personal grooming?: A Lot Help from another person toileting, which includes using toliet, bedpan, or urinal?: Total Help from another person bathing (including washing, rinsing, drying)?: Total Help from another person to put on and taking off regular upper body clothing?: A Lot Help from another person to put on and taking off regular lower body clothing?: Total 6 Click Score: 9    End of Session    OT Visit Diagnosis: Unsteadiness on feet (R26.81);Muscle weakness (generalized) (M62.81);Other symptoms and signs involving cognitive function   Activity Tolerance  Patient tolerated treatment well   Patient Left in bed;with bed alarm set   Nurse Communication Mobility status        Time: 2458-0998 OT Time Calculation (min): 21 min  Charges: OT General Charges $OT Visit: 1 Visit OT Treatments $Self Care/Home Management : 8-22 mins  Zenovia Jarred, MSOT, OTR/L [place] [facility] Office Number: [phone] Pager: [phone]  Zenovia Jarred 10/29/2020, 4:30 PM

## 2020-10-29 NOTE — Progress Notes (Signed)
   Reviewed tele.  Rate well controlled.  Can increase metoprolol to 75 BID if HR becomes an issue. He has a pacer so bradycardia is not a concern.    Jettie Booze, MD

## 2020-10-29 NOTE — Care Management Important Message (Signed)
Important Message  Patient Details  Name: [name] [name] MRN: [id number] Date of Birth: September 12, 1932   Medicare Important Message Given:  Yes - Important Message mailed due to current National Emergency   Verbal consent obtained due to current National Emergency  Relationship to patient: Self Contact Name: [name] Call Date: 10/29/20  Time: 1400 Phone: [phone] Outcome: No Answer/Busy Important Message mailed to: Patient address on file     [name] 10/29/2020, 2:01 PM

## 2020-10-29 NOTE — Progress Notes (Signed)
PROGRESS NOTE                                                                                                                                                                                                             Patient Demographics:    [name] [name], is a 86 y.o. male, DOB - [birth year], LSL:[id number]  Outpatient Primary MD for the patient is Crist Infante, MD   Admit date - 10/23/2020   LOS - 6  Chief Complaint  Patient presents with  . Weakness       Brief Narrative: Patient is a 86 y.o. male with PMHx of CAD s/p CABG in 2002, s/p PPM implantation, chronic systolic heart failure, chronic atrial fibrillation-not on anticoagulation (history of recurrent GI bleeding), history of colon cancer, history of recurrent GI bleeding-who presented with a 3-4-day history of weakness, dark appearing stools.  In the emergency room-he was found to be febrile.  While in the ED-he had an episode of wide-complex tachycardia associated with hypotension-required emergent cardioversion.  He was also found to be COVID-19 positive and acute urinary retention.  He was subsequently admitted to the hospitalist service.  See below for further details.  COVID-19 vaccinated status: Vaccinated (but not a booster)  Significant Events: 11/26>> Admit to [facility] for generalized weakness, fever, and dark-colored stools-COVID-19 positive.  Had episode of wide-complex tachycardia with hypotension requiring emergent cardioversion. 11/26>> acute urinary retention-difficult Foley catheter -18 French coud catheter placed by urology.  Significant studies: 11/26>>Chest x-ray: Streaky left basilar opacities, mild cardiomegaly and pulmonary vascular congestion. 11/27>> chest x-ray: Bilateral patchy opacities. 11/28>> Echo: EF 25-30%.  RV systolic function moderately reduced.  COVID-19 medications: Steroids: 11/26>>12/2 Remdesivir:  11/26>>11/30  Antibiotics: None  Microbiology data: 11/26 >>blood culture: No growth  Procedures: 11/26>> cardioversion by ED MD for wide-complex tachycardia (presumed V. tach) associated with hypotension.  Consults: Cardiology, GI, urology  DVT prophylaxis: SCDs Start: 10/23/20 1506   Subjective:   Awake/alert this morning-following commands. Had delirium/sundowning last night.   Assessment  & Plan :   Acute Hypoxic Resp Failure due to Covid 19 Viral pneumonia: Improved-remains on room air-has completed a course of steroids and Remdesivir.  Fever: afebrile COVID-19 Labs: Recent Labs    10/27/20 0051 10/28/20 0201 10/28/20 0425  DDIMER 1.42* 0.85* 1.01*  FERRITIN 104  --  84  CRP 6.6*  --  3.7*       Component Value Date/Time   BNP 541.6 (H) 10/23/2020 1640   BNP 379.4 (H) 02/25/2016 0951    Recent Labs  Lab 10/23/20 1640  PROCALCITON 0.63    Lab Results  Component Value Date   SARSCOV2NAA POSITIVE (A) 10/23/2020     Prone/Incentive Spirometry: encouraged  incentive spirometry use 3-4/hour.  Sepsis: Likely secondary to COVID-19 infection-sepsis physiology has resolved. Cultures negative so far.  Wide-complex tachycardia: Reviewed ED/cardiology notes-concern for V. tach-cardioverted by EDMD on admission on 11/26.  Subsequently started on amiodarone infusion that has been since discontinued.  Currently stable on beta-blocker and digoxin.  Chronic atrial fibrillation with RVR: Rate better controlled today-amiodarone infusion discontinued on 11/30-rate controlled with beta-blocker/digoxin.  Not a candidate for anticoagulation.  Cardiology following.    Chronic systolic heart failure: Euvolemic-does not require diuretics.  GI bleeding: No overt GI bleeding for the past several days.  Hemoglobin stable-GI signed off.  Continue PPI.  Acute urinary retention: S/p coud catheter insertion by urology on 11/26-recommendations-continue Flomax-and keep catheter  for 1 week prior to initiating a voiding trial. We will attempt a voiding trial on 12/3   Deconditioning: Secondary to acute illness-superimposed on some amount of chronic debility.  Plans are for either SNF versus home health on discharge.  Social worker following.  Delirium: Per patient's daughter-patient does get confused in the hospital. He appears to have some amount of sundowning/delirium mostly in the evening hours/nighttime-we will go and stop steroids today. This morning he is awake and alert and actually following commands. Continue to maintain delirium precautions.   Goals of care: Discussed with patient earlier during the hospital stay when he was a little bit more coherent-and subsequently with patient's daughter multiple times-continue medical treatment-DNI in place-okay for CPR/defibrillation/ACLS medications.    RN pressure injury documentation: Pressure Injury 10/23/20 Coccyx (Active)  10/23/20 1600  Location: Coccyx  Location Orientation:   Staging:   Wound Description (Comments):   Present on Admission:     GI prophylaxis: PPI  ABG:    Component Value Date/Time   HCO3 22.8 06/11/2007 1615   TCO2 24 06/11/2007 1615   ACIDBASEDEF 1.0 06/11/2007 1615    Vent Settings: N/A    Condition - Extremely Guarded  Family Communication  : Daughter [name]  [phone]) on 12/2  Diet :  Diet Order            DIET SOFT Room service appropriate? No; Fluid consistency: Thin  Diet effective now                  Disposition Plan  :   Status is: Inpatient  Remains inpatient appropriate because:Inpatient level of care appropriate due to severity of illness   Dispo: The patient is from: Home              Anticipated d/c is to: TBD              Anticipated d/c date is: > 1 days              Patient currently is medically stable to d/c.  Barriers to discharge: Awaiting SNF bed  Antimicorbials  :    Anti-infectives (From admission, onward)   Start     Dose/Rate  Route Frequency Ordered Stop   10/24/20 1000  remdesivir 100 mg in sodium chloride 0.9 % 100 mL IVPB  Status:  Discontinued       "Followed by" Linked Group Details   100  mg 200 mL/hr over 30 Minutes Intravenous Daily 10/23/20 1509 10/26/20 1111   10/23/20 1600  remdesivir 200 mg in sodium chloride 0.9% 250 mL IVPB       "Followed by" Linked Group Details   200 mg 580 mL/hr over 30 Minutes Intravenous Once 10/23/20 1509 10/23/20 1922   10/23/20 1215  cefTRIAXone (ROCEPHIN) 1 g in sodium chloride 0.9 % 100 mL IVPB        1 g 200 mL/hr over 30 Minutes Intravenous  Once 10/23/20 1206 10/23/20 1307   10/23/20 1215  azithromycin (ZITHROMAX) 500 mg in sodium chloride 0.9 % 250 mL IVPB        500 mg 250 mL/hr over 60 Minutes Intravenous  Once 10/23/20 1206 10/23/20 1341      Inpatient Medications  Scheduled Meds: . albuterol  2 puff Inhalation Q6H  . vitamin C  500 mg Oral Daily  . Chlorhexidine Gluconate Cloth  6 each Topical Daily  . cholecalciferol  1,000 Units Oral Daily  . digoxin  0.0625 mg Oral Daily  . metoprolol tartrate  50 mg Oral BID  . multivitamin with minerals  1 tablet Oral Daily  . pantoprazole  40 mg Oral BID  . predniSONE  20 mg Oral Daily  . sodium chloride flush  3 mL Intravenous Q12H  . tamsulosin  0.4 mg Oral Daily  . vitamin B-12  1,000 mcg Oral Daily  . zinc sulfate  220 mg Oral Daily   Continuous Infusions:  PRN Meds:.acetaminophen, albuterol, chlorpheniramine-HYDROcodone, diphenoxylate-atropine, guaiFENesin-dextromethorphan, LORazepam   Time Spent in minutes  25   See all Orders from today for further details   Oren Binet M.D on 10/29/2020 at 1:40 PM  To page go to [email or web address] - use universal password  [facility] -  Office  [phone]    Objective:   Vitals:   10/28/20 2332 10/29/20 0412 10/29/20 0722 10/29/20 1158  BP: 118/79 126/88 (!) 143/94 129/83  Pulse: 69 78 85 82  Resp: 16 (!) 24    Temp: 98 F (36.7 C) 98.1 F  (36.7 C) 97.6 F (36.4 C) 97.8 F (36.6 C)  TempSrc: Axillary Axillary Axillary Axillary  SpO2: 95% 95% 96% 99%  Weight:      Height:        Wt Readings from Last 3 Encounters:  10/23/20 62 kg  06/15/20 62.1 kg  01/08/20 63.5 kg     Intake/Output Summary (Last 24 hours) at 10/29/2020 1340 Last data filed at 10/29/2020 0656 Gross per 24 hour  Intake --  Output 400 ml  Net -400 ml     Physical Exam Gen Exam: HEENT:atraumatic, normocephalic Chest: B/L clear to auscultation anteriorly CVS:S1S2 regular Abdomen:soft non tender, non distended Extremities:no edema Neurology: Non focal Skin: no rash   Data Review:    CBC Recent Labs  Lab 10/24/20 0341 10/24/20 1046 10/25/20 0223 10/25/20 0941 10/26/20 0247 10/26/20 1510 10/27/20 0051 10/27/20 0051 10/27/20 1032 10/27/20 1818 10/28/20 0201 10/28/20 0425 10/29/20 0033  WBC 11.6*   < > 12.2*   < > 11.9*   < > 9.8   < > 9.9 9.5 7.5 7.7 10.3  HGB 9.3*   < > 9.6*   < > 9.5*   < > 9.8*   < > 9.5* 9.2* 9.0* 8.7* 9.8*  HCT 28.7*   < > 29.2*   < > 29.3*   < > 29.4*   < > 29.0* 27.8* 27.6* 26.0* 30.0*  PLT 125*   < >  137*   < > 151   < > 177   < > 171 155 133* 134* 181  MCV 97.3   < > 96.7   < > 95.4   < > 94.5   < > 93.9 94.2 95.5 93.5 95.8  MCH 31.5   < > 31.8   < > 30.9   < > 31.5   < > 30.7 31.2 31.1 31.3 31.3  MCHC 32.4   < > 32.9   < > 32.4   < > 33.3   < > 32.8 33.1 32.6 33.5 32.7  RDW 14.5   < > 14.5   < > 14.3   < > 14.1   < > 13.9 14.0 13.7 13.9 13.9  LYMPHSABS 0.5*  --  0.5*  --  0.4*  --  0.2*  --   --   --   --  0.3*  --   MONOABS 0.4  --  0.5  --  0.4  --  0.6  --   --   --   --  0.5  --   EOSABS 0.0  --  0.0  --  0.0  --  0.0  --   --   --   --  0.0  --   BASOSABS 0.0  --  0.0  --  0.0  --  0.0  --   --   --   --  0.0  --    < > = values in this interval not displayed.    Chemistries  Recent Labs  Lab 10/24/20 0341 10/24/20 0341 10/25/20 0223 10/26/20 0247 10/27/20 0051 10/28/20 0425  10/29/20 0033  NA 140   < > 136 134* 134* 135 135  K 3.9   < > 4.8 4.3 4.0 4.4 4.1  CL 107   < > 104 102 98 102 101  CO2 23   < > 22 22 20* 22 25  GLUCOSE 120*   < > 156* 157* 115* 83 114*  BUN 15   < > 26* 24* 27* 27* 27*  CREATININE 0.93   < > 1.15 0.99 1.35* 1.20 1.20  CALCIUM 8.3*   < > 8.6* 8.5* 8.8* 8.6* 8.6*  MG 2.4   < > 2.4 2.2 2.3 2.2 2.3  AST 40  --  36 33 40 44*  --   ALT 25  --  24 23 27 30   --   ALKPHOS 31*  --  29* 30* 35* 28*  --   BILITOT 1.0  --  0.8 1.0 1.4* 1.3*  --    < > = values in this interval not displayed.   ------------------------------------------------------------------------------------------------------------------ No results for input(s): CHOL, HDL, LDLCALC, TRIG, CHOLHDL, LDLDIRECT in the last 72 hours.  Lab Results  Component Value Date   HGBA1C  06/11/2007    5.7 (NOTE)   The ADA recommends the following therapeutic goals for glycemic   control related to Hgb A1C measurement:   Goal of Therapy:   < 7.0% Hgb A1C   Action Suggested:  > 8.0% Hgb A1C   Ref:  Diabetes Care, 22, Suppl. 1, 1999   ------------------------------------------------------------------------------------------------------------------ No results for input(s): TSH, T4TOTAL, T3FREE, THYROIDAB in the last 72 hours.  Invalid input(s): FREET3 ------------------------------------------------------------------------------------------------------------------ Recent Labs    10/27/20 0051 10/28/20 0425  FERRITIN 104 84    Coagulation profile Recent Labs  Lab 10/23/20 1129  INR 1.2    Recent Labs    10/28/20 0201  10/28/20 0425  DDIMER 0.85* 1.01*    Cardiac Enzymes No results for input(s): CKMB, TROPONINI, MYOGLOBIN in the last 168 hours.  Invalid input(s): CK ------------------------------------------------------------------------------------------------------------------    Component Value Date/Time   BNP 541.6 (H) 10/23/2020 1640   BNP 379.4 (H) 02/25/2016 0951     Micro Results Recent Results (from the past 240 hour(s))  Urine culture     Status: None   Collection Time: 10/23/20 11:19 AM   Specimen: In/Out Cath Urine  Result Value Ref Range Status   Specimen Description IN/OUT CATH URINE  Final   Special Requests NONE  Final   Culture   Final    NO GROWTH Performed at [facility], [place] [address], [place], [place] [address]    Report Status 10/25/2020 FINAL  Final  Blood culture (routine x 2)     Status: None   Collection Time: 10/23/20 11:29 AM   Specimen: BLOOD  Result Value Ref Range Status   Specimen Description BLOOD RIGHT ANTECUBITAL  Final   Special Requests   Final    BOTTLES DRAWN AEROBIC AND ANAEROBIC Blood Culture adequate volume   Culture   Final    NO GROWTH 5 DAYS Performed at [facility], [place] [address] [place], [place] [address]    Report Status 10/28/2020 FINAL  Final  Blood culture (routine x 2)     Status: None   Collection Time: 10/23/20 11:30 AM   Specimen: BLOOD  Result Value Ref Range Status   Specimen Description BLOOD LEFT ANTECUBITAL  Final   Special Requests   Final    BOTTLES DRAWN AEROBIC AND ANAEROBIC Blood Culture adequate volume   Culture   Final    NO GROWTH 5 DAYS Performed at [facility], [place] [address], [place], [place] [address]    Report Status 10/28/2020 FINAL  Final  Resp Panel by RT-PCR (Flu A&B, Covid) Nasopharyngeal Swab     Status: Abnormal   Collection Time: 10/23/20 12:02 PM   Specimen: Nasopharyngeal Swab; Nasopharyngeal(NP) swabs in vial transport medium  Result Value Ref Range Status   SARS Coronavirus 2 by RT PCR POSITIVE (A) NEGATIVE Final    Comment: emailed L. Berdik RN 14:25 10/23/20 ([email or web address]) (NOTE) SARS-CoV-2 target nucleic acids are DETECTED.  The SARS-CoV-2 RNA is generally detectable in upper respiratory specimens during the acute phase of infection. Positive results are indicative of the presence of the identified virus, but do not rule out  bacterial infection or co-infection with other pathogens not detected by the test. Clinical correlation with patient history and other diagnostic information is necessary to determine patient infection status. The expected result is Negative.  Fact Sheet for Patients: EntrepreneurPulse.com.au  Fact Sheet for Healthcare Providers: IncredibleEmployment.be  This test is not yet approved or cleared by the [place] FDA and  has been authorized for detection and/or diagnosis of SARS-CoV-2 by FDA under an Emergency Use Authorization (EUA).  This EUA will remain in effect (meaning this test can be used) for the duration of  the COVID- 19 declaration under Section 564(b)(1) of the Act, 21 U.S.C. section 360bbb-3(b)(1), unless the authorization is terminated or revoked sooner.     Influenza A by PCR NEGATIVE NEGATIVE Final   Influenza B by PCR NEGATIVE NEGATIVE Final    Comment: (NOTE) The Xpert Xpress SARS-CoV-2/FLU/RSV plus assay is intended as an aid in the diagnosis of influenza from Nasopharyngeal swab specimens and should not be used as a sole basis for  treatment. Nasal washings and aspirates are unacceptable for Xpert Xpress SARS-CoV-2/FLU/RSV testing.  Fact Sheet for Patients: EntrepreneurPulse.com.au  Fact Sheet for Healthcare Providers: IncredibleEmployment.be  This test is not yet approved or cleared by the [place] FDA and has been authorized for detection and/or diagnosis of SARS-CoV-2 by FDA under an Emergency Use Authorization (EUA). This EUA will remain in effect (meaning this test can be used) for the duration of the COVID-19 declaration under Section 564(b)(1) of the Act, 21 U.S.C. section 360bbb-3(b)(1), unless the authorization is terminated or revoked.  Performed at [facility], [place] [address], [place], [place] [address]     Radiology Reports DG Chest [place] 1  View  Result Date: 10/23/2020 CLINICAL DATA:  Questionable sepsis. EXAM: PORTABLE CHEST 1 VIEW COMPARISON:  October 14, 2013. FINDINGS: Similar mildly enlarged cardiac silhouette with median sternotomy. Pulmonary vascular congestion. Left subclavian approach cardiac rhythm maintenance device. Aortic atherosclerosis. Streaky left basilar opacities. No confluent consolidation. No visible pleural effusions or pneumothorax. No acute osseous abnormality. IMPRESSION: 1. Low lung volumes with streaky left basilar opacities, which may represent atelectasis, aspiration, and/or pneumonia. No confluent consolidation. 2. Mild cardiomegaly and pulmonary vascular congestion. Electronically Signed   By: Margaretha Sheffield MD   On: 10/23/2020 11:54   DG Chest Port 1V same Day  Result Date: 10/24/2020 CLINICAL DATA:  Reported COVID-19 positive EXAM: PORTABLE CHEST 1 VIEW COMPARISON:  October 23, 2020 FINDINGS: There is ill-defined opacity in the right base and left mid lung regions. Left midlung ill-defined opacity is partly obscured by pacemaker. There is mild left base atelectasis. No consolidation. Heart is upper normal in size with pulmonary vascularity normal. Pacemaker leads are attached to the right atrium and right ventricle. Patient is status post coronary artery bypass grafting. There is aortic atherosclerosis. No adenopathy no evident bone lesions. IMPRESSION: Areas of airspace opacity in the right base and left mid lung regions which potentially may represent atypical organism pneumonia, particularly given the clinical history. Mild left base atelectasis. No consolidation. Heart upper normal in size, stable. Pacemaker leads attached to right atrium and right ventricle. Status post coronary artery bypass grafting. Aortic Atherosclerosis (ICD10-I70.0). Electronically Signed   By: Lowella Grip III M.D.   On: 10/24/2020 09:53   ECHOCARDIOGRAM COMPLETE  Result Date: 10/25/2020    ECHOCARDIOGRAM REPORT    Patient Name:   KIAN OTTAVIANO Date of Exam: 10/25/2020 Medical Rec #:  [id number]      Height:       69.0 in Accession #:    [id number]     Weight:       136.7 lb Date of Birth:  [birth year]      BSA:          1.757 m Patient Age:    80 years       BP:           101/83 mmHg Patient Gender: M              HR:           100 bpm. Exam Location:  Inpatient Procedure: 2D Echo, Cardiac Doppler and Color Doppler Indications:    I48.91* Unspeicified atrial fibrillation  History:        Patient has prior history of Echocardiogram examinations, most                 recent 01/04/2016. Prior CABG. Covid19 positive.  Sonographer:    [place] Referring Phys: [address]  Maydell Knoebel IMPRESSIONS  1. Left ventricular ejection fraction, by estimation, is 25 to 30%. The left ventricle has severely decreased function. The left ventricle demonstrates global hypokinesis. Left ventricular diastolic function could not be evaluated. There is severe akinesis of the left ventricular, basal-mid inferior and inferolateral walls. Incoordinate (paced) septal motion.  2. Right ventricular systolic function is moderately reduced. The right ventricular size is mildly enlarged. There is severely elevated pulmonary artery systolic pressure. The estimated right ventricular systolic pressure is 26.9 mmHg.  3. Left atrial size was massively dilated.  4. Right atrial size was moderately dilated.  5. Mild mitral subvalvular calcification.  6. Mild mitral subvalvular thickening/fibrosis.  7. Tethering of the mitral valve posterior leaflet.  8. The mitral valve is abnormal. Severe mitral valve regurgitation.  9. The tricuspid valve is abnormal. Tricuspid valve regurgitation is moderate. 10. The aortic valve is tricuspid. Aortic valve regurgitation is mild. Mild aortic valve sclerosis is present, with no evidence of aortic valve stenosis. 11. Moderately dilated pulmonary artery. 12. The inferior vena cava is dilated in size with <50% respiratory  variability, suggesting right atrial pressure of 15 mmHg. Comparison(s): Changes from prior study are noted. 01/04/2016: LVEF 40-45%, pacer wire, mild AI, moderate MR, moderate LAE, mild RAE. FINDINGS  Left Ventricle: Left ventricular ejection fraction, by estimation, is 25 to 30%. The left ventricle has severely decreased function. The left ventricle demonstrates global hypokinesis. Severe akinesis of the left ventricular, basal-mid inferior wall and  inferolateral wall. The left ventricular internal cavity size was normal in size. There is no left ventricular hypertrophy. Abnormal (paradoxical) septal motion, consistent with RV pacemaker. Left ventricular diastolic function could not be evaluated due to atrial fibrillation. Left ventricular diastolic function could not be evaluated. Right Ventricle: The right ventricular size is mildly enlarged. No increase in right ventricular wall thickness. Right ventricular systolic function is moderately reduced. There is severely elevated pulmonary artery systolic pressure. The tricuspid regurgitant velocity is 3.87 m/s, and with an assumed right atrial pressure of 15 mmHg, the estimated right ventricular systolic pressure is 48.5 mmHg. Left Atrium: Left atrial size was massively dilated. Right Atrium: Right atrial size was moderately dilated. Pericardium: There is no evidence of pericardial effusion. Mitral Valve: The mitral valve is abnormal. There is mild thickening of the mitral valve leaflet(s). Mild mitral subvalvular calcification. Mild mitral subvalvular thickening/fibrosis. Tethering involving the posterior leaflet is noted. Severe mitral valve regurgitation, with eccentric posteriorly directed jet. Tricuspid Valve: The tricuspid valve is abnormal. Tricuspid valve regurgitation is moderate. Aortic Valve: The aortic valve is tricuspid. Aortic valve regurgitation is mild. Mild aortic valve sclerosis is present, with no evidence of aortic valve stenosis. Pulmonic Valve:  The pulmonic valve was grossly normal. Pulmonic valve regurgitation is mild. Aorta: The aortic root and ascending aorta are structurally normal, with no evidence of dilitation. Pulmonary Artery: The pulmonary artery is moderately dilated. Venous: The inferior vena cava is dilated in size with less than 50% respiratory variability, suggesting right atrial pressure of 15 mmHg. IAS/Shunts: No atrial level shunt detected by color flow Doppler. Additional Comments: A pacer wire is visualized.  LEFT VENTRICLE PLAX 2D LVIDd:         5.40 cm LVIDs:         4.80 cm LV PW:         0.90 cm LV IVS:        1.00 cm  RIGHT VENTRICLE         IVC TAPSE (M-mode): 1.8 cm  IVC diam: 2.00 cm LEFT ATRIUM              Index        RIGHT ATRIUM           Index LA diam:        4.60 cm  2.62 cm/m   RA Area:     23.50 cm LA Vol (A2C):   184.0 ml 104.70 ml/m RA Volume:   71.40 ml  40.63 ml/m LA Vol (A4C):   161.0 ml 91.61 ml/m LA Biplane Vol: 170.0 ml 96.73 ml/m  AORTIC VALVE LVOT Vmax:   73.10 cm/s LVOT Vmean:  44.100 cm/s LVOT VTI:    0.122 m  AORTA Ao Root diam: 3.60 cm Ao Asc diam:  3.40 cm MR Peak grad:    136.9 mmHg  TRICUSPID VALVE MR Mean grad:    60.0 mmHg   TR Peak grad:   59.9 mmHg MR Vmax:         585.00 cm/s TR Vmax:        387.00 cm/s MR Vmean:        349.0 cm/s MR PISA:         3.08 cm    SHUNTS MR PISA Eff ROA: 17 mm      Systemic VTI: 0.12 m MR PISA Radius:  0.70 cm Lyman Bishop MD Electronically signed by Lyman Bishop MD Signature Date/Time: 10/25/2020/4:41:18 PM    Final

## 2020-10-29 NOTE — Progress Notes (Signed)
Patient lying in bed resting and relaxed. Patient took this morning medications without difficulty. Will continue to monitor.

## 2020-10-29 NOTE — TOC Progression Note (Signed)
Transition of Care [facility]) - Progression Note    Patient Details  Name: [name] [name] MRN: [id number] Date of Birth: November 19, 1932  Transition of Care [facility]) CM/SW [place], LCSW Phone Number: 10/29/2020, 11:09 AM  Clinical Narrative:    Per Ronney Lion, they are unable to accommodate patient at this time due to their low staffing numbers and him requiring more care.    Expected Discharge Plan: Skilled Nursing Facility Barriers to Discharge: Ship broker, Continued Medical Work up, SNF Pending bed offer  Expected Discharge Plan and Services Expected Discharge Plan: [place] In-house Referral: Clinical Social Work   Post Acute Care Choice: [place] Living arrangements for the past 2 months: Apartment                                       Social Determinants of Health (SDOH) Interventions    Readmission Risk Interventions No flowsheet data found.

## 2020-10-30 NOTE — Progress Notes (Signed)
Patient had foley catheter removed at 1200. Patient has not urinated. Bladder scan shows 59ml in bladder. MD is aware.

## 2020-10-30 NOTE — Progress Notes (Signed)
Physical Therapy Treatment Patient Details Name: [name] [name] MRN: [id number] DOB: [birth year] Today's Date: 10/30/2020    History of Present Illness 86 y.o. male with medical history significant of CAD s/p CABG in 2002, s/p PPM, ischemic cardiomyopathy, systolic CHF, HTN, HLD, PAF not on anticoagulation secondary to recurrent GI bleed, back surgery, and history of colon cancer presented 10/23/20 with complaints of weakness, fever, tarry stools x 3 days. +COVID pneumonia with sepsis; while in ED episode of wide-complex tachycardia with hypotension requiring emergent cardioversion.    PT Comments    Pt asleep on entry, easily roused and agreeable to therapy. Pt reports " I got a good night sleep and feel much better." Pt continues to be limited in safe mobility by generalized weakness, and associated decreased balance and endurance, in presence of decreased cognition and safety awareness. Pt is mod A for bed mobility, maxA for transfers and modA for face to face stepping from bed to chair. Once in chair pt participated in seated therex. D/c plans remain appropriate. PT will continue to follow acutely.    Follow Up Recommendations  Supervision/Assistance - 24 hour;SNF (per SW, daughter cannot provide level of care pt needs)     Equipment Recommendations  Wheelchair (measurements PT);Wheelchair cushion (measurements PT);3in1 (PT)       Precautions / Restrictions Precautions Precautions: Fall Precaution Comments: reports fell recently Restrictions Weight Bearing Restrictions: No    Mobility  Bed Mobility Overal bed mobility: Needs Assistance Bed Mobility: Supine to Sit     Supine to sit: Mod assist     General bed mobility comments: LE already off bed on entry, modA for pulling up against therapist,and for pad scoot of hips to EoB  Transfers Overall transfer level: Needs assistance Equipment used: Straight cane Transfers: Sit to/from Stand Sit to Stand: Max assist          General transfer comment: increased time and cuing to bring trunk to upright and straighten knees  Ambulation/Gait Ambulation/Gait assistance: Mod assist Gait Distance (Feet): 2 Feet Assistive device: 1 person hand held assist Gait Pattern/deviations: Step-to pattern;Decreased step length - right;Decreased step length - left;Trunk flexed Gait velocity: slowed   General Gait Details: modA for stepping from bed to recliner, maximal multimodal cuing for sequencing of steps to recliner         Balance Overall balance assessment: Needs assistance Sitting-balance support: No upper extremity supported;Feet supported Sitting balance-Leahy Scale: Fair     Standing balance support: During functional activity;Bilateral upper extremity supported Standing balance-Leahy Scale: Zero Standing balance comment: requires max A once in standing                             Cognition Arousal/Alertness: Awake/alert Behavior During Therapy: WFL for tasks assessed/performed Overall Cognitive Status: No family/caregiver present to determine baseline cognitive functioning Area of Impairment: Orientation;Memory;Safety/judgement;Awareness;Attention;Following commands;Problem solving                 Orientation Level: Situation;Time (easily oriented to hospital) Current Attention Level: Focused;Sustained   Following Commands: Follows multi-step commands with increased time;Follows one step commands with increased time Safety/Judgement: Decreased awareness of safety;Decreased awareness of deficits Awareness: Emergent Problem Solving: Difficulty sequencing;Requires verbal cues;Requires tactile cues General Comments: much better cognition today       Exercises General Exercises - Lower Extremity Long Arc Quad: AROM;Both;10 reps;Seated Hip Flexion/Marching: AROM;Both;10 reps;Seated    General Comments General comments (skin integrity, edema, etc.): VSS on RA  Pertinent  Vitals/Pain Pain Assessment: 0-10           PT Goals (current goals can now be found in the care plan section) Acute Rehab PT Goals Patient Stated Goal: go home ASAP Time For Goal Achievement: 11/07/20 Potential to Achieve Goals: Good Progress towards PT goals: Progressing toward goals    Frequency    Min 2X/week      PT Plan Discharge plan needs to be updated;Frequency needs to be updated       AM-PAC PT "6 Clicks" Mobility   Outcome Measure  Help needed turning from your back to your side while in a flat bed without using bedrails?: A Little Help needed moving from lying on your back to sitting on the side of a flat bed without using bedrails?: A Little Help needed moving to and from a bed to a chair (including a wheelchair)?: A Little Help needed standing up from a chair using your arms (e.g., wheelchair or bedside chair)?: A Little Help needed to walk in hospital room?: A Lot Help needed climbing 3-5 steps with a railing? : Total 6 Click Score: 15    End of Session Equipment Utilized During Treatment: Gait belt Activity Tolerance: Patient tolerated treatment well Patient left: in chair;with call bell/phone within reach;with chair alarm set Nurse Communication: Mobility status PT Visit Diagnosis: Unsteadiness on feet (R26.81);Muscle weakness (generalized) (M62.81);History of falling (Z91.81)     Time: 7510-2585 PT Time Calculation (min) (ACUTE ONLY): 27 min  Charges:  $Therapeutic Exercise: 8-22 mins $Therapeutic Activity: 8-22 mins                     Steele Ledonne B. Migdalia Dk PT, DPT Acute Rehabilitation Services Pager [phone] Office [phone]    [place] 10/30/2020, 4:00 PM

## 2020-10-30 NOTE — Progress Notes (Signed)
PROGRESS NOTE                                                                                                                                                                                                             Patient Demographics:    [name] [name], is a 86 y.o. male, DOB - [birth year], XHB:[id number]  Outpatient Primary MD for the patient is Crist Infante, MD   Admit date - 10/23/2020   LOS - 7  Chief Complaint  Patient presents with  . Weakness       Brief Narrative: Patient is a 86 y.o. male with PMHx of CAD s/p CABG in 2002, s/p PPM implantation, chronic systolic heart failure, chronic atrial fibrillation-not on anticoagulation (history of recurrent GI bleeding), history of colon cancer, history of recurrent GI bleeding-who presented with a 3-4-day history of weakness, dark appearing stools.  In the emergency room-he was found to be febrile.  While in the ED-he had an episode of wide-complex tachycardia associated with hypotension-required emergent cardioversion.  He was also found to be COVID-19 positive and acute urinary retention.  He was subsequently admitted to the hospitalist service.  See below for further details.  COVID-19 vaccinated status: Vaccinated (but not a booster)  Significant Events: 11/26>> Admit to [facility] for generalized weakness, fever, and dark-colored stools-COVID-19 positive.  Had episode of wide-complex tachycardia with hypotension requiring emergent cardioversion. 11/26>> acute urinary retention-difficult Foley catheter -18 French coud catheter placed by urology. 12/3>> Foley removed-voiding trial in progress.  Significant studies: 11/26>>Chest x-ray: Streaky left basilar opacities, mild cardiomegaly and pulmonary vascular congestion. 11/27>> chest x-ray: Bilateral patchy opacities. 11/28>> Echo: EF 25-30%.  RV systolic function moderately reduced.  COVID-19 medications: Steroids:  11/26>>12/2 Remdesivir: 11/26>>11/30  Antibiotics: None  Microbiology data: 11/26 >>blood culture: No growth  Procedures: 11/26>> cardioversion by ED MD for wide-complex tachycardia (presumed V. tach) associated with hypotension.  Consults: Cardiology, GI, urology  DVT prophylaxis: SCDs Start: 10/23/20 1506   Subjective:   Uneventful night-no major issues per RN-less confused compared to yesterday-following commands.  Understands that he was in the hospital-and correctly able to tell me his daughter's name.   Assessment  & Plan :   Acute Hypoxic Resp Failure due to Covid 19 Viral pneumonia: Improved-remains on room air-has completed a course of steroids and Remdesivir.  Fever: afebrile COVID-19 Labs: Recent  Labs    10/28/20 0201 10/28/20 0425  DDIMER 0.85* 1.01*  FERRITIN  --  84  CRP  --  3.7*       Component Value Date/Time   BNP 541.6 (H) 10/23/2020 1640   BNP 379.4 (H) 02/25/2016 0951    Recent Labs  Lab 10/23/20 1640  PROCALCITON 0.63    Lab Results  Component Value Date   SARSCOV2NAA POSITIVE (A) 10/23/2020     Prone/Incentive Spirometry: encouraged  incentive spirometry use 3-4/hour.  Sepsis: Likely secondary to COVID-19 infection-sepsis physiology has resolved. Cultures negative so far.  Wide-complex tachycardia: Reviewed ED/cardiology notes-concern for V. tach-cardioverted by EDMD on admission on 11/26.  Subsequently started on amiodarone infusion that has been since discontinued.  Currently stable on beta-blocker and digoxin.  Chronic atrial fibrillation with RVR: Rate better controlled today-amiodarone infusion discontinued on 11/30-rate controlled with beta-blocker/digoxin.  Not a candidate for anticoagulation.  Cardiology following.    Chronic systolic heart failure: Euvolemic-does not require diuretics.  GI bleeding: No overt GI bleeding for the past several days.  Hemoglobin stable-GI signed off.  Continue PPI.  Acute urinary retention:  S/p coud catheter insertion by urology on 11/26-recommendations were to keep catheter in for 1 week prior to initiating a voiding trial.  Have asked nurse to discontinue Foley catheter on 12/3-and see if he is able to void.  Remains on Flomax.   Deconditioning: Secondary to acute illness-superimposed on some amount of chronic debility.  Plans are for either SNF versus home health on discharge.  Social worker following.  Delirium: Per patient's daughter-patient does get confused in the hospital.  He continues to have waxing and waning sundowning/delirium-this morning he is relatively awake and alert-and significantly less confused than yesterday.  No longer on steroids-they were discontinued on 12/2.  Continue to maintain delirium precautions-continue to expect some amount of delirium-as long as he remains hospitalized.    Goals of care: Discussed with patient earlier during the hospital stay when he was a little bit more coherent-and subsequently with patient's daughter multiple times-continue medical treatment-DNI in place-okay for CPR/defibrillation/ACLS medications.    RN pressure injury documentation: Pressure Injury 10/23/20 Coccyx (Active)  10/23/20 1600  Location: Coccyx  Location Orientation:   Staging:   Wound Description (Comments):   Present on Admission:     GI prophylaxis: PPI  ABG:    Component Value Date/Time   HCO3 22.8 06/11/2007 1615   TCO2 24 06/11/2007 1615   ACIDBASEDEF 1.0 06/11/2007 1615    Vent Settings: N/A    Condition - Extremely Guarded  Family Communication  : Daughter [name]  [phone]) on 12/3  Diet :  Diet Order            DIET SOFT Room service appropriate? No; Fluid consistency: Thin  Diet effective now                  Disposition Plan  :   Status is: Inpatient  Remains inpatient appropriate because:Inpatient level of care appropriate due to severity of illness   Dispo: The patient is from: Home              Anticipated d/c is  to: TBD              Anticipated d/c date is: > 1 days              Patient currently is medically stable to d/c.  Barriers to discharge: Awaiting SNF bed  Antimicorbials  :  Anti-infectives (From admission, onward)   Start     Dose/Rate Route Frequency Ordered Stop   10/24/20 1000  remdesivir 100 mg in sodium chloride 0.9 % 100 mL IVPB  Status:  Discontinued       "Followed by" Linked Group Details   100 mg 200 mL/hr over 30 Minutes Intravenous Daily 10/23/20 1509 10/26/20 1111   10/23/20 1600  remdesivir 200 mg in sodium chloride 0.9% 250 mL IVPB       "Followed by" Linked Group Details   200 mg 580 mL/hr over 30 Minutes Intravenous Once 10/23/20 1509 10/23/20 1922   10/23/20 1215  cefTRIAXone (ROCEPHIN) 1 g in sodium chloride 0.9 % 100 mL IVPB        1 g 200 mL/hr over 30 Minutes Intravenous  Once 10/23/20 1206 10/23/20 1307   10/23/20 1215  azithromycin (ZITHROMAX) 500 mg in sodium chloride 0.9 % 250 mL IVPB        500 mg 250 mL/hr over 60 Minutes Intravenous  Once 10/23/20 1206 10/23/20 1341      Inpatient Medications  Scheduled Meds: . albuterol  2 puff Inhalation Q6H  . vitamin C  500 mg Oral Daily  . Chlorhexidine Gluconate Cloth  6 each Topical Daily  . cholecalciferol  1,000 Units Oral Daily  . digoxin  0.0625 mg Oral Daily  . metoprolol tartrate  50 mg Oral BID  . multivitamin with minerals  1 tablet Oral Daily  . pantoprazole  40 mg Oral BID  . sodium chloride flush  3 mL Intravenous Q12H  . tamsulosin  0.4 mg Oral Daily  . vitamin B-12  1,000 mcg Oral Daily  . zinc sulfate  220 mg Oral Daily   Continuous Infusions:  PRN Meds:.acetaminophen, albuterol, chlorpheniramine-HYDROcodone, diphenoxylate-atropine, guaiFENesin-dextromethorphan, LORazepam   Time Spent in minutes  25   See all Orders from today for further details   Oren Binet M.D on 10/30/2020 at 3:06 PM  To page go to [email or web address] - use universal password  [facility] -  Office   [phone]    Objective:   Vitals:   10/29/20 2345 10/30/20 0408 10/30/20 0727 10/30/20 1203  BP: (!) 142/73 140/84 (!) 148/76 122/71  Pulse: 73 77 82 74  Resp: 20 (!) 22    Temp: 98.1 F (36.7 C) 98 F (36.7 C) 97.7 F (36.5 C) 98.9 F (37.2 C)  TempSrc: Axillary Axillary Axillary Axillary  SpO2: 95% 98% 94% 99%  Weight:      Height:        Wt Readings from Last 3 Encounters:  10/23/20 62 kg  06/15/20 62.1 kg  01/08/20 63.5 kg     Intake/Output Summary (Last 24 hours) at 10/30/2020 1506 Last data filed at 10/30/2020 1205 Gross per 24 hour  Intake 110 ml  Output 925 ml  Net -815 ml     Physical Exam Gen Exam: Pleasantly confused-easily redirectable. HEENT:atraumatic, normocephalic Chest: B/L clear to auscultation anteriorly CVS:S1S2 regular Abdomen:soft non tender, non distended Extremities:no edema Neurology: Non focal Skin: no rash   Data Review:    CBC Recent Labs  Lab 10/24/20 0341 10/24/20 1046 10/25/20 0223 10/25/20 0941 10/26/20 0247 10/26/20 1510 10/27/20 0051 10/27/20 0051 10/27/20 1032 10/27/20 1818 10/28/20 0201 10/28/20 0425 10/29/20 0033  WBC 11.6*   < > 12.2*   < > 11.9*   < > 9.8   < > 9.9 9.5 7.5 7.7 10.3  HGB 9.3*   < > 9.6*   < > 9.5*   < >  9.8*   < > 9.5* 9.2* 9.0* 8.7* 9.8*  HCT 28.7*   < > 29.2*   < > 29.3*   < > 29.4*   < > 29.0* 27.8* 27.6* 26.0* 30.0*  PLT 125*   < > 137*   < > 151   < > 177   < > 171 155 133* 134* 181  MCV 97.3   < > 96.7   < > 95.4   < > 94.5   < > 93.9 94.2 95.5 93.5 95.8  MCH 31.5   < > 31.8   < > 30.9   < > 31.5   < > 30.7 31.2 31.1 31.3 31.3  MCHC 32.4   < > 32.9   < > 32.4   < > 33.3   < > 32.8 33.1 32.6 33.5 32.7  RDW 14.5   < > 14.5   < > 14.3   < > 14.1   < > 13.9 14.0 13.7 13.9 13.9  LYMPHSABS 0.5*  --  0.5*  --  0.4*  --  0.2*  --   --   --   --  0.3*  --   MONOABS 0.4  --  0.5  --  0.4  --  0.6  --   --   --   --  0.5  --   EOSABS 0.0  --  0.0  --  0.0  --  0.0  --   --   --   --  0.0  --    BASOSABS 0.0  --  0.0  --  0.0  --  0.0  --   --   --   --  0.0  --    < > = values in this interval not displayed.    Chemistries  Recent Labs  Lab 10/24/20 0341 10/24/20 0341 10/25/20 0223 10/26/20 0247 10/27/20 0051 10/28/20 0425 10/29/20 0033  NA 140   < > 136 134* 134* 135 135  K 3.9   < > 4.8 4.3 4.0 4.4 4.1  CL 107   < > 104 102 98 102 101  CO2 23   < > 22 22 20* 22 25  GLUCOSE 120*   < > 156* 157* 115* 83 114*  BUN 15   < > 26* 24* 27* 27* 27*  CREATININE 0.93   < > 1.15 0.99 1.35* 1.20 1.20  CALCIUM 8.3*   < > 8.6* 8.5* 8.8* 8.6* 8.6*  MG 2.4   < > 2.4 2.2 2.3 2.2 2.3  AST 40  --  36 33 40 44*  --   ALT 25  --  24 23 27 30   --   ALKPHOS 31*  --  29* 30* 35* 28*  --   BILITOT 1.0  --  0.8 1.0 1.4* 1.3*  --    < > = values in this interval not displayed.   ------------------------------------------------------------------------------------------------------------------ No results for input(s): CHOL, HDL, LDLCALC, TRIG, CHOLHDL, LDLDIRECT in the last 72 hours.  Lab Results  Component Value Date   HGBA1C  06/11/2007    5.7 (NOTE)   The ADA recommends the following therapeutic goals for glycemic   control related to Hgb A1C measurement:   Goal of Therapy:   < 7.0% Hgb A1C   Action Suggested:  > 8.0% Hgb A1C   Ref:  Diabetes Care, 22, Suppl. 1, 1999   ------------------------------------------------------------------------------------------------------------------ No results for input(s): TSH, T4TOTAL, T3FREE, THYROIDAB in the last  72 hours.  Invalid input(s): FREET3 ------------------------------------------------------------------------------------------------------------------ Recent Labs    10/28/20 0425  FERRITIN 84    Coagulation profile No results for input(s): INR, PROTIME in the last 168 hours.  Recent Labs    10/28/20 0201 10/28/20 0425  DDIMER 0.85* 1.01*    Cardiac Enzymes No results for input(s): CKMB, TROPONINI, MYOGLOBIN in the last 168  hours.  Invalid input(s): CK ------------------------------------------------------------------------------------------------------------------    Component Value Date/Time   BNP 541.6 (H) 10/23/2020 1640   BNP 379.4 (H) 02/25/2016 0951    Micro Results Recent Results (from the past 240 hour(s))  Urine culture     Status: None   Collection Time: 10/23/20 11:19 AM   Specimen: In/Out Cath Urine  Result Value Ref Range Status   Specimen Description IN/OUT CATH URINE  Final   Special Requests NONE  Final   Culture   Final    NO GROWTH Performed at [facility], [place] [address] [place], [place] [address]    Report Status 10/25/2020 FINAL  Final  Blood culture (routine x 2)     Status: None   Collection Time: 10/23/20 11:29 AM   Specimen: BLOOD  Result Value Ref Range Status   Specimen Description BLOOD RIGHT ANTECUBITAL  Final   Special Requests   Final    BOTTLES DRAWN AEROBIC AND ANAEROBIC Blood Culture adequate volume   Culture   Final    NO GROWTH 5 DAYS Performed at [facility], [place] [address], [place], [place] [address]    Report Status 10/28/2020 FINAL  Final  Blood culture (routine x 2)     Status: None   Collection Time: 10/23/20 11:30 AM   Specimen: BLOOD  Result Value Ref Range Status   Specimen Description BLOOD LEFT ANTECUBITAL  Final   Special Requests   Final    BOTTLES DRAWN AEROBIC AND ANAEROBIC Blood Culture adequate volume   Culture   Final    NO GROWTH 5 DAYS Performed at [facility], [place] [address], [place], [place] [address]    Report Status 10/28/2020 FINAL  Final  Resp Panel by RT-PCR (Flu A&B, Covid) Nasopharyngeal Swab     Status: Abnormal   Collection Time: 10/23/20 12:02 PM   Specimen: Nasopharyngeal Swab; Nasopharyngeal(NP) swabs in vial transport medium  Result Value Ref Range Status   SARS Coronavirus 2 by RT PCR POSITIVE (A) NEGATIVE Final    Comment: emailed L. Berdik RN 14:25 10/23/20 ([email or web address]) (NOTE) SARS-CoV-2  target nucleic acids are DETECTED.  The SARS-CoV-2 RNA is generally detectable in upper respiratory specimens during the acute phase of infection. Positive results are indicative of the presence of the identified virus, but do not rule out bacterial infection or co-infection with other pathogens not detected by the test. Clinical correlation with patient history and other diagnostic information is necessary to determine patient infection status. The expected result is Negative.  Fact Sheet for Patients: EntrepreneurPulse.com.au  Fact Sheet for Healthcare Providers: IncredibleEmployment.be  This test is not yet approved or cleared by the [place] FDA and  has been authorized for detection and/or diagnosis of SARS-CoV-2 by FDA under an Emergency Use Authorization (EUA).  This EUA will remain in effect (meaning this test can be used) for the duration of  the COVID- 19 declaration under Section 564(b)(1) of the Act, 21 U.S.C. section 360bbb-3(b)(1), unless the authorization is terminated or revoked sooner.     Influenza A by PCR NEGATIVE NEGATIVE Final   Influenza  B by PCR NEGATIVE NEGATIVE Final    Comment: (NOTE) The Xpert Xpress SARS-CoV-2/FLU/RSV plus assay is intended as an aid in the diagnosis of influenza from Nasopharyngeal swab specimens and should not be used as a sole basis for treatment. Nasal washings and aspirates are unacceptable for Xpert Xpress SARS-CoV-2/FLU/RSV testing.  Fact Sheet for Patients: EntrepreneurPulse.com.au  Fact Sheet for Healthcare Providers: IncredibleEmployment.be  This test is not yet approved or cleared by the [place] FDA and has been authorized for detection and/or diagnosis of SARS-CoV-2 by FDA under an Emergency Use Authorization (EUA). This EUA will remain in effect (meaning this test can be used) for the duration of the COVID-19 declaration under Section  564(b)(1) of the Act, 21 U.S.C. section 360bbb-3(b)(1), unless the authorization is terminated or revoked.  Performed at [facility], [place] [address] [place], [place] [address]     Radiology Reports DG Chest [place] 1 View  Result Date: 10/23/2020 CLINICAL DATA:  Questionable sepsis. EXAM: PORTABLE CHEST 1 VIEW COMPARISON:  October 14, 2013. FINDINGS: Similar mildly enlarged cardiac silhouette with median sternotomy. Pulmonary vascular congestion. Left subclavian approach cardiac rhythm maintenance device. Aortic atherosclerosis. Streaky left basilar opacities. No confluent consolidation. No visible pleural effusions or pneumothorax. No acute osseous abnormality. IMPRESSION: 1. Low lung volumes with streaky left basilar opacities, which may represent atelectasis, aspiration, and/or pneumonia. No confluent consolidation. 2. Mild cardiomegaly and pulmonary vascular congestion. Electronically Signed   By: Margaretha Sheffield MD   On: 10/23/2020 11:54   DG Chest Port 1V same Day  Result Date: 10/24/2020 CLINICAL DATA:  Reported COVID-19 positive EXAM: PORTABLE CHEST 1 VIEW COMPARISON:  October 23, 2020 FINDINGS: There is ill-defined opacity in the right base and left mid lung regions. Left midlung ill-defined opacity is partly obscured by pacemaker. There is mild left base atelectasis. No consolidation. Heart is upper normal in size with pulmonary vascularity normal. Pacemaker leads are attached to the right atrium and right ventricle. Patient is status post coronary artery bypass grafting. There is aortic atherosclerosis. No adenopathy no evident bone lesions. IMPRESSION: Areas of airspace opacity in the right base and left mid lung regions which potentially may represent atypical organism pneumonia, particularly given the clinical history. Mild left base atelectasis. No consolidation. Heart upper normal in size, stable. Pacemaker leads attached to right atrium and right ventricle. Status post  coronary artery bypass grafting. Aortic Atherosclerosis (ICD10-I70.0). Electronically Signed   By: Lowella Grip III M.D.   On: 10/24/2020 09:53   ECHOCARDIOGRAM COMPLETE  Result Date: 10/25/2020    ECHOCARDIOGRAM REPORT   Patient Name:   [name] [name] Date of Exam: 10/25/2020 Medical Rec #:  [id number]      Height:       69.0 in Accession #:    [id number]     Weight:       136.7 lb Date of Birth:  [birth year]      BSA:          1.757 m Patient Age:    60 years       BP:           101/83 mmHg Patient Gender: M              HR:           100 bpm. Exam Location:  Inpatient Procedure: 2D Echo, Cardiac Doppler and Color Doppler Indications:    I48.91* Unspeicified atrial fibrillation  History:        Patient has  prior history of Echocardiogram examinations, most                 recent 01/04/2016. Prior CABG. Covid19 positive.  Sonographer:    Merrie Roof RDCS Referring Phys: [place]  1. Left ventricular ejection fraction, by estimation, is 25 to 30%. The left ventricle has severely decreased function. The left ventricle demonstrates global hypokinesis. Left ventricular diastolic function could not be evaluated. There is severe akinesis of the left ventricular, basal-mid inferior and inferolateral walls. Incoordinate (paced) septal motion.  2. Right ventricular systolic function is moderately reduced. The right ventricular size is mildly enlarged. There is severely elevated pulmonary artery systolic pressure. The estimated right ventricular systolic pressure is 44.9 mmHg.  3. Left atrial size was massively dilated.  4. Right atrial size was moderately dilated.  5. Mild mitral subvalvular calcification.  6. Mild mitral subvalvular thickening/fibrosis.  7. Tethering of the mitral valve posterior leaflet.  8. The mitral valve is abnormal. Severe mitral valve regurgitation.  9. The tricuspid valve is abnormal. Tricuspid valve regurgitation is moderate. 10. The aortic valve is tricuspid.  Aortic valve regurgitation is mild. Mild aortic valve sclerosis is present, with no evidence of aortic valve stenosis. 11. Moderately dilated pulmonary artery. 12. The inferior vena cava is dilated in size with <50% respiratory variability, suggesting right atrial pressure of 15 mmHg. Comparison(s): Changes from prior study are noted. 01/04/2016: LVEF 40-45%, pacer wire, mild AI, moderate MR, moderate LAE, mild RAE. FINDINGS  Left Ventricle: Left ventricular ejection fraction, by estimation, is 25 to 30%. The left ventricle has severely decreased function. The left ventricle demonstrates global hypokinesis. Severe akinesis of the left ventricular, basal-mid inferior wall and  inferolateral wall. The left ventricular internal cavity size was normal in size. There is no left ventricular hypertrophy. Abnormal (paradoxical) septal motion, consistent with RV pacemaker. Left ventricular diastolic function could not be evaluated due to atrial fibrillation. Left ventricular diastolic function could not be evaluated. Right Ventricle: The right ventricular size is mildly enlarged. No increase in right ventricular wall thickness. Right ventricular systolic function is moderately reduced. There is severely elevated pulmonary artery systolic pressure. The tricuspid regurgitant velocity is 3.87 m/s, and with an assumed right atrial pressure of 15 mmHg, the estimated right ventricular systolic pressure is 67.5 mmHg. Left Atrium: Left atrial size was massively dilated. Right Atrium: Right atrial size was moderately dilated. Pericardium: There is no evidence of pericardial effusion. Mitral Valve: The mitral valve is abnormal. There is mild thickening of the mitral valve leaflet(s). Mild mitral subvalvular calcification. Mild mitral subvalvular thickening/fibrosis. Tethering involving the posterior leaflet is noted. Severe mitral valve regurgitation, with eccentric posteriorly directed jet. Tricuspid Valve: The tricuspid valve is  abnormal. Tricuspid valve regurgitation is moderate. Aortic Valve: The aortic valve is tricuspid. Aortic valve regurgitation is mild. Mild aortic valve sclerosis is present, with no evidence of aortic valve stenosis. Pulmonic Valve: The pulmonic valve was grossly normal. Pulmonic valve regurgitation is mild. Aorta: The aortic root and ascending aorta are structurally normal, with no evidence of dilitation. Pulmonary Artery: The pulmonary artery is moderately dilated. Venous: The inferior vena cava is dilated in size with less than 50% respiratory variability, suggesting right atrial pressure of 15 mmHg. IAS/Shunts: No atrial level shunt detected by color flow Doppler. Additional Comments: A pacer wire is visualized.  LEFT VENTRICLE PLAX 2D LVIDd:         5.40 cm LVIDs:         4.80  cm LV PW:         0.90 cm LV IVS:        1.00 cm  RIGHT VENTRICLE         IVC TAPSE (M-mode): 1.8 cm  IVC diam: 2.00 cm LEFT ATRIUM              Index        RIGHT ATRIUM           Index LA diam:        4.60 cm  2.62 cm/m   RA Area:     23.50 cm LA Vol (A2C):   184.0 ml 104.70 ml/m RA Volume:   71.40 ml  40.63 ml/m LA Vol (A4C):   161.0 ml 91.61 ml/m LA Biplane Vol: 170.0 ml 96.73 ml/m  AORTIC VALVE LVOT Vmax:   73.10 cm/s LVOT Vmean:  44.100 cm/s LVOT VTI:    0.122 m  AORTA Ao Root diam: 3.60 cm Ao Asc diam:  3.40 cm MR Peak grad:    136.9 mmHg  TRICUSPID VALVE MR Mean grad:    60.0 mmHg   TR Peak grad:   59.9 mmHg MR Vmax:         585.00 cm/s TR Vmax:        387.00 cm/s MR Vmean:        349.0 cm/s MR PISA:         3.08 cm    SHUNTS MR PISA Eff ROA: 17 mm      Systemic VTI: 0.12 m MR PISA Radius:  0.70 cm Lyman Bishop MD Electronically signed by Lyman Bishop MD Signature Date/Time: 10/25/2020/4:41:18 PM    Final

## 2020-10-30 NOTE — TOC Progression Note (Signed)
Transition of Care [facility]) - Progression Note    Patient Details  Name: [name] [name] MRN: [id number] Date of Birth: Oct 25, 1932  [facility] CM/SW [place], [place] Phone Number: 10/30/2020, 12:56 PM  Clinical Narrative:     CSW spoke with [facility] who noted that they would like pt to be more than 24 hours without restraints of any kind. They will re evaluate on Monday.SW will continue to follow.  Expected Discharge Plan: Skilled Nursing Facility Barriers to Discharge: Ship broker, Continued Medical Work up, SNF Pending bed offer  Expected Discharge Plan and Services Expected Discharge Plan: [place] In-house Referral: Clinical Social Work   Post Acute Care Choice: [place] Living arrangements for the past 2 months: Apartment                                       Social Determinants of Health (SDOH) Interventions    Readmission Risk Interventions No flowsheet data found.

## 2020-10-31 DIAGNOSIS — I472 Ventricular tachycardia: Secondary | ICD-10-CM | POA: Diagnosis not present

## 2020-10-31 DIAGNOSIS — A419 Sepsis, unspecified organism: Secondary | ICD-10-CM | POA: Diagnosis not present

## 2020-10-31 DIAGNOSIS — K922 Gastrointestinal hemorrhage, unspecified: Secondary | ICD-10-CM | POA: Diagnosis not present

## 2020-10-31 DIAGNOSIS — U071 COVID-19: Secondary | ICD-10-CM | POA: Diagnosis not present

## 2020-10-31 LAB — CBC
HCT: 27 % — ABNORMAL LOW (ref 39.0–52.0)
Hemoglobin: 9 g/dL — ABNORMAL LOW (ref 13.0–17.0)
MCH: 31 pg (ref 26.0–34.0)
MCHC: 33.3 g/dL (ref 30.0–36.0)
MCV: 93.1 fL (ref 80.0–100.0)
Platelets: 181 10*3/uL (ref 150–400)
RBC: 2.9 MIL/uL — ABNORMAL LOW (ref 4.22–5.81)
RDW: 13.7 % (ref 11.5–15.5)
WBC: 7.7 10*3/uL (ref 4.0–10.5)
nRBC: 0 % (ref 0.0–0.2)

## 2020-10-31 LAB — BASIC METABOLIC PANEL
Anion gap: 11 (ref 5–15)
BUN: 20 mg/dL (ref 8–23)
CO2: 25 mmol/L (ref 22–32)
Calcium: 8.5 mg/dL — ABNORMAL LOW (ref 8.9–10.3)
Chloride: 101 mmol/L (ref 98–111)
Creatinine, Ser: 1.04 mg/dL (ref 0.61–1.24)
GFR, Estimated: >60 mL/min (ref >60)
Glucose, Bld: 72 mg/dL (ref 70–99)
Potassium: 3.9 mmol/L (ref 3.5–5.1)
Sodium: 137 mmol/L (ref 135–145)

## 2020-10-31 LAB — DIGOXIN LEVEL: Digoxin Level: 1.2 ng/mL (ref 1.0–2.0)

## 2020-10-31 MED ORDER — DIGOXIN 125 MCG PO TABS
0.0625 mg | ORAL_TABLET | ORAL | Status: DC
  Administered 2020-11-02 – 2020-11-04 (×2): 0.0625 mg via ORAL
  Filled 2020-10-31 (×2): qty 1

## 2020-10-31 NOTE — Consult Note (Signed)
Urology Consult  Referring physician: Dr. Deno Etienne Reason for referral: difficult foley placement  Chief Complaint: urinary retention  History of Present Illness: [name] [name] is a 86yo with a history of BPH who was admitted with a GI bleed and COVID19. The patient has moderate LUTS at baseline prior to admission. He has a fair stream, urinary hesitancy, nocturia 3-5 and a feeling of incomplete emptying. NO hx of UTI or hematuria. The patient was unable to urinate approximately 1 week ago and a 18Fr coude foley catheter was placed by urology without issue after several nurses were unable to do so. He was started on flomax. He underwent TOV after several days however was unable to urinate. Nursing was again unable to place the catheter and urology was consulted.  Past Medical History:  Diagnosis Date  . CAD (coronary artery disease)    Old inferolateral MI s/p CABG in 2002  . CAP (community acquired pneumonia) 10/03/2011  . Cardiomyopathy    s/p ICD 09/30/08; EF 35 to 40% per echo in October 2021  . Chronic systolic CHF (congestive heart failure) ([facility]) 07/24/2013  . Colon cancer ([facility])    s/p chemo, RT, sugery  . Colorectal anastomotic stricture   . Diverticulosis   . ED (erectile dysfunction)   . GERD (gastroesophageal reflux disease)    rarely  . HTN (hypertension)   . ICD (implantable cardiac defibrillator) discharge October 2012   removed 2016    now only PPM  . Iron deficiency anemia   . Paroxysmal atrial fibrillation ([facility])    controlled with amiodarone, not a coumadin candidate due to GI bleeding   Past Surgical History:  Procedure Laterality Date  . APPENDECTOMY    . BACK SURGERY    . BIOPSY  09/22/2018   Procedure: BIOPSY;  Surgeon: Gatha Mayer, MD;  Location: [facility];  Service: Endoscopy;;  . CARDIAC CATHETERIZATION  11/07/2000   EF 32%  . CARDIAC DEFIBRILLATOR PLACEMENT  09/30/08   MDT dual chamber ICD placed by Dr Rayann Heman  . CARDIOVASCULAR STRESS TEST  08/06/2008   EF  30%  . CHOLECYSTECTOMY    . COLON RESECTION     x2  . COLONOSCOPY     multiple  . COLONOSCOPY N/A 09/22/2018   Procedure: COLONOSCOPY;  Surgeon: Gatha Mayer, MD;  Location: [facility];  Service: Endoscopy;  Laterality: N/A;  . CORONARY ARTERY BYPASS GRAFT    . EP IMPLANTABLE DEVICE N/A 10/15/2015   Procedure:  PPM Generator Changeout;  Surgeon: [name], MD;  Location: [facility];  Service: Cardiovascular;  Laterality: N/A;  . HERNIA REPAIR    . US ECHOCARDIOGRAPHY  08/11/2008   EF 30-35%  . VASECTOMY      Medications: I have reviewed the patient's current medications. Allergies:  Allergies  Allergen Reactions  . Iron     Stomach issues  . Lescol [Fluvastatin Sodium]     Dizziness  . Pentazocine Lactate     unknown  . Vytorin [Ezetimibe-Simvastatin]     Leg cramps   . Aspirin     Bleeding     Family History  Problem Relation Age of Onset  . Colon cancer Father   . Diabetes Sister   . Colon polyps Brother   . Pancreatic cancer Brother 103   Social History:  reports that he has quit smoking. His smoking use included cigars. He has never used smokeless tobacco. He reports that he does not drink alcohol and does not use drugs.  Review of Systems  Constitutional: Positive for fever.  Respiratory: Positive for cough and shortness of breath.   Genitourinary: Positive for difficulty urinating.  All other systems reviewed and are negative.   Physical Exam:  Vital signs in last 24 hours: Temp:  [97.6 F (36.4 C)-98.9 F (37.2 C)] 98 F (36.7 C) (12/04 1154) Pulse Rate:  [60-81] 81 (12/04 1154) Resp:  [17-22] 17 (12/04 1154) BP: (115-137)/(61-111) 123/70 (12/04 1154) SpO2:  [97 %-100 %] 97 % (12/04 1154) Physical Exam Vitals reviewed.  Constitutional:      Appearance: Normal appearance.  HENT:     Head: Normocephalic and atraumatic.     Nose: Nose normal.     Mouth/Throat:     Mouth: Mucous membranes are dry.  Eyes:     Extraocular  Movements: Extraocular movements intact.     Pupils: Pupils are equal, round, and reactive to light.  Cardiovascular:     Rate and Rhythm: Tachycardia present.  Pulmonary:     Effort: Pulmonary effort is normal. No respiratory distress.  Abdominal:     General: Abdomen is flat. There is distension.  Genitourinary:    Penis: Normal and uncircumcised.      Testes: Normal.        Right: Mass or tenderness not present.        Left: Mass or tenderness not present.     Epididymis:     Right: Normal.     Left: Normal.  Musculoskeletal:        General: Normal range of motion.     Cervical back: Normal range of motion and neck supple.  Skin:    General: Skin is warm and dry.  Neurological:     General: No focal deficit present.     Mental Status: He is alert and oriented to person, place, and time. Mental status is at baseline.  Psychiatric:        Mood and Affect: Mood normal.        Behavior: Behavior normal.        Thought Content: Thought content normal.        Judgment: Judgment normal.     Laboratory Data:  Results for orders placed or performed during the hospital encounter of 10/23/20 (from the past 72 hour(s))  CBC     Status: Abnormal   Collection Time: 10/29/20 12:33 AM  Result Value Ref Range   WBC 10.3 4.0 - 10.5 K/uL   RBC 3.13 (L) 4.22 - 5.81 MIL/uL   Hemoglobin 9.8 (L) 13.0 - 17.0 g/dL   HCT 30.0 (L) 39 - 52 %   MCV 95.8 80.0 - 100.0 fL   MCH 31.3 26.0 - 34.0 pg   MCHC 32.7 30.0 - 36.0 g/dL   RDW 13.9 11.5 - 15.5 %   Platelets 181 150 - 400 K/uL   nRBC 0.0 0.0 - 0.2 %    Comment: Performed at [facility], [place] [address], [place], [place] [address]  Basic metabolic panel     Status: Abnormal   Collection Time: 10/29/20 12:33 AM  Result Value Ref Range   Sodium 135 135 - 145 mmol/L   Potassium 4.1 3.5 - 5.1 mmol/L   Chloride 101 98 - 111 mmol/L   CO2 25 22 - 32 mmol/L   Glucose, Bld 114 (H) 70 - 99 mg/dL    Comment: Glucose reference range applies  only to samples taken after fasting for at least 8 hours.   BUN 27 (  H) 8 - 23 mg/dL   Creatinine, Ser 1.20 0.61 - 1.24 mg/dL   Calcium 8.6 (L) 8.9 - 10.3 mg/dL   GFR, Estimated 59 (L) >60 mL/min    Comment: (NOTE) Calculated using the CKD-EPI Creatinine Equation (2021)    Anion gap 9 5 - 15    Comment: Performed at [facility] [address] [place], [place] [address]  Magnesium     Status: None   Collection Time: 10/29/20 12:33 AM  Result Value Ref Range   Magnesium 2.3 1.7 - 2.4 mg/dL    Comment: Performed at [facility] [address] [place], [place] [address]  Digoxin level     Status: None   Collection Time: 10/31/20  3:29 AM  Result Value Ref Range   Digoxin Level 1.2 1.0 - 2.0 ng/mL    Comment: Performed at [facility] [address] [place], [place] [address]  CBC     Status: Abnormal   Collection Time: 10/31/20  3:29 AM  Result Value Ref Range   WBC 7.7 4.0 - 10.5 K/uL   RBC 2.90 (L) 4.22 - 5.81 MIL/uL   Hemoglobin 9.0 (L) 13.0 - 17.0 g/dL   HCT 27.0 (L) 39 - 52 %   MCV 93.1 80.0 - 100.0 fL   MCH 31.0 26.0 - 34.0 pg   MCHC 33.3 30.0 - 36.0 g/dL   RDW 13.7 11.5 - 15.5 %   Platelets 181 150 - 400 K/uL   nRBC 0.0 0.0 - 0.2 %    Comment: Performed at [facility], [place] [address] [place], [place] [address]  Basic metabolic panel     Status: Abnormal   Collection Time: 10/31/20  3:29 AM  Result Value Ref Range   Sodium 137 135 - 145 mmol/L   Potassium 3.9 3.5 - 5.1 mmol/L   Chloride 101 98 - 111 mmol/L   CO2 25 22 - 32 mmol/L   Glucose, Bld 72 70 - 99 mg/dL    Comment: Glucose reference range applies only to samples taken after fasting for at least 8 hours.   BUN 20 8 - 23 mg/dL   Creatinine, Ser 1.04 0.61 - 1.24 mg/dL   Calcium 8.5 (L) 8.9 - 10.3 mg/dL   GFR, Estimated >60 >60 mL/min    Comment: (NOTE) Calculated using the CKD-EPI Creatinine Equation (2021)    Anion gap 11 5 - 15    Comment: Performed at [facility] [address], [place], [place] [address]   Recent Results (from the past 240 hour(s))  Urine culture     Status: None   Collection Time: 10/23/20 11:19 AM   Specimen: In/Out Cath Urine  Result Value Ref Range Status   Specimen Description IN/OUT CATH URINE  Final   Special Requests NONE  Final   Culture   Final    NO GROWTH Performed at [facility], [place] [address], [place], [place] [address]    Report Status 10/25/2020 FINAL  Final  Blood culture (routine x 2)     Status: None   Collection Time: 10/23/20 11:29 AM   Specimen: BLOOD  Result Value Ref Range Status   Specimen Description BLOOD RIGHT ANTECUBITAL  Final   Special Requests   Final    BOTTLES DRAWN AEROBIC AND ANAEROBIC Blood Culture adequate volume   Culture   Final    NO GROWTH 5 DAYS Performed at [facility],  [address] [place], [place] [address]    Report Status 10/28/2020 FINAL  Final  Blood culture (routine x 2)     Status: None   Collection Time: 10/23/20 11:30 AM   Specimen: BLOOD  Result Value Ref Range Status   Specimen Description BLOOD LEFT ANTECUBITAL  Final   Special Requests   Final    BOTTLES DRAWN AEROBIC AND ANAEROBIC Blood Culture adequate volume   Culture   Final    NO GROWTH 5 DAYS Performed at [facility], [place] [address] [place], [place] [address]    Report Status 10/28/2020 FINAL  Final  Resp Panel by RT-PCR (Flu A&B, Covid) Nasopharyngeal Swab     Status: Abnormal   Collection Time: 10/23/20 12:02 PM   Specimen: Nasopharyngeal Swab; Nasopharyngeal(NP) swabs in vial transport medium  Result Value Ref Range Status   SARS Coronavirus 2 by RT PCR POSITIVE (A) NEGATIVE Final    Comment: emailed L. Berdik RN 14:25 10/23/20 ([email or web address]) (NOTE) SARS-CoV-2 target nucleic acids are DETECTED.  The SARS-CoV-2 RNA is generally detectable in upper respiratory specimens during the acute phase of infection. Positive results are indicative of the presence of the identified  virus, but do not rule out bacterial infection or co-infection with other pathogens not detected by the test. Clinical correlation with patient history and other diagnostic information is necessary to determine patient infection status. The expected result is Negative.  Fact Sheet for Patients: EntrepreneurPulse.com.au  Fact Sheet for Healthcare Providers: IncredibleEmployment.be  This test is not yet approved or cleared by the [place] FDA and  has been authorized for detection and/or diagnosis of SARS-CoV-2 by FDA under an Emergency Use Authorization (EUA).  This EUA will remain in effect (meaning this test can be used) for the duration of  the COVID- 19 declaration under Section 564(b)(1) of the Act, 21 U.S.C. section 360bbb-3(b)(1), unless the authorization is terminated or revoked sooner.     Influenza A by PCR NEGATIVE NEGATIVE Final   Influenza B by PCR NEGATIVE NEGATIVE Final    Comment: (NOTE) The Xpert Xpress SARS-CoV-2/FLU/RSV plus assay is intended as an aid in the diagnosis of influenza from Nasopharyngeal swab specimens and should not be used as a sole basis for treatment. Nasal washings and aspirates are unacceptable for Xpert Xpress SARS-CoV-2/FLU/RSV testing.  Fact Sheet for Patients: EntrepreneurPulse.com.au  Fact Sheet for Healthcare Providers: IncredibleEmployment.be  This test is not yet approved or cleared by the [place] FDA and has been authorized for detection and/or diagnosis of SARS-CoV-2 by FDA under an Emergency Use Authorization (EUA). This EUA will remain in effect (meaning this test can be used) for the duration of the COVID-19 declaration under Section 564(b)(1) of the Act, 21 U.S.C. section 360bbb-3(b)(1), unless the authorization is terminated or revoked.  Performed at [facility], [place] [address], [place], [place] [address]    Creatinine: Recent  Labs    10/25/20 0223 10/26/20 0247 10/27/20 0051 10/28/20 0425 10/29/20 0033 10/31/20 0329  CREATININE 1.15 0.99 1.35* 1.20 1.20 1.04   Baseline Creatinine: 1.1  Impression/Assessment:  86yo with BPH with Urinary retention, failed TOV x1 while still hospitalized, minimally mobile.  Plan:  1. A 18 french coude was placed without incident with return of clear yellow urine. The patient should be continued on flomax 0.4mg  daily and the foley should remain in place for about another week before any attempts at [place], however would recommend referral to Urology for outpatient TOV once patient is  more mobile.   [name] [name] 10/31/2020, 11:56 AM

## 2020-10-31 NOTE — Progress Notes (Addendum)
PROGRESS NOTE                                                                                                                                                                                                             Patient Demographics:    [name] [name], is a 86 y.o. male, DOB - Aug 30, 1932, SWF:[id number]  Outpatient Primary MD for the patient is [name] Infante, MD   Admit date - 10/23/2020   LOS - 8  Chief Complaint  Patient presents with  . Weakness       Brief Narrative: Patient is a 86 y.o. male with PMHx of CAD s/p CABG in 2002, s/p PPM implantation, chronic systolic heart failure, chronic atrial fibrillation-not on anticoagulation (history of recurrent GI bleeding), history of colon cancer, history of recurrent GI bleeding-who presented with a 3-4-day history of weakness, dark appearing stools.  In the emergency room-he was found to be febrile.  While in the ED-he had an episode of wide-complex tachycardia associated with hypotension-required emergent cardioversion.  He was also found to be COVID-19 positive and acute urinary retention.  He was subsequently admitted to the hospitalist service.  See below for further details.  COVID-19 vaccinated status: Vaccinated (but not a booster)  Significant Events: 11/26>> Admit to [facility] for generalized weakness, fever, and dark-colored stools-COVID-19 positive.  Had episode of wide-complex tachycardia with hypotension requiring emergent cardioversion. 11/26>> acute urinary retention-difficult Foley catheter -18 French coud catheter placed by urology. 12/3>> Foley removed-voiding trial in progress.  Significant studies: 11/26>>Chest x-ray: Streaky left basilar opacities, mild cardiomegaly and pulmonary vascular congestion. 11/27>> chest x-ray: Bilateral patchy opacities. 11/28>> Echo: EF 25-30%.  RV systolic function moderately reduced.  COVID-19 medications: Steroids:  11/26>>12/2 Remdesivir: 11/26>>11/30  Antibiotics: None  Microbiology data: 11/26 >>blood culture: No growth  Procedures: 11/26>> cardioversion by ED MD for wide-complex tachycardia (presumed V. tach) associated with hypotension.  Consults: Cardiology, GI, Urology  DVT prophylaxis:  SCDs Start: 10/23/20 1506   Subjective:   Patient in bed, pleasantly confused, denies any headache chest or abdominal pain.   Assessment  & Plan :   Acute Hypoxic Resp Failure due to Covid 19 Viral pneumonia: Improved-remains on room air-has completed a course of steroids and Remdesivir.  Fever: afebrile  COVID-19 Labs: No results for input(s): DDIMER, FERRITIN, LDH, CRP in the last 72 hours.  Component Value Date/Time   BNP 541.6 (H) 10/23/2020 1640   BNP 379.4 (H) 02/25/2016 0951    No results for input(s): PROCALCITON in the last 168 hours.  Lab Results  Component Value Date   SARSCOV2NAA POSITIVE (A) 10/23/2020     Prone/Incentive Spirometry: encouraged  incentive spirometry use 3-4/hour.  Sepsis: Likely secondary to COVID-19 infection-sepsis physiology has resolved. Cultures negative so far.  Wide-complex tachycardia: Reviewed ED/cardiology notes-concern for V. tach-cardioverted by EDMD on admission on 11/26.  Subsequently started on amiodarone infusion that has been since discontinued.  Currently stable on beta-blocker and digoxin.  Chronic atrial fibrillation with RVR [name] of greater than 3 has a Pacemaker : Rate better controlled today-amiodarone infusion discontinued on 11/30-rate controlled with beta-blocker/digoxin.  Not a candidate for anticoagulation due to issues with GI bleeding.  Cardiology following.    Chronic systolic heart failure EF 35%, severe MR : Euvolemic-does not require diuretics. Cards following, follow post DC - Dr [name].  GI bleeding: No overt GI bleeding for the past several days. Hemoglobin stable-GI signed off.  Continue PPI.  Acute  urinary retention: S/p coud catheter insertion by urology on 11/26-Flomax on board, as per recommendation Foley was removed after the week on 10/30/2020 unfortunately has developed retention again.  Urology to replace Foley on 10/31/2020.  Deconditioning: Secondary to acute illness-superimposed on some amount of chronic debility.  Plans are for either SNF versus home health on discharge.  Social worker following.  Delirium: Per patient's daughter-patient does get confused in the hospital.  He continues to have waxing and waning sundowning/delirium-this morning he is relatively awake and alert-and significantly less confused than yesterday.  No longer on steroids-they were discontinued on 12/2.  Continue to maintain delirium precautions-continue to expect some amount of delirium-as long as he remains hospitalized.    Goals of care: Discussed with patient earlier during the hospital stay when he was a little bit more coherent-and subsequently with patient's daughter multiple times-continue medical treatment-DNI in place-okay for CPR/defibrillation/ACLS medications.    RN pressure injury documentation: Pressure Injury 10/23/20 Coccyx (Active)  10/23/20 1600  Location: Coccyx  Location Orientation:   Staging:   Wound Description (Comments):   Present on Admission:      GI prophylaxis: PPI  Condition - Extremely Guarded  Family Communication  : Daughter [name] [name]  [phone]) on 10/31/20 - daughter upset about foley being replaced.  Diet :  Diet Order            DIET SOFT Room service appropriate? No; Fluid consistency: Thin  Diet effective now                  Disposition Plan  :  Status is: Inpatient  Remains inpatient appropriate because:Inpatient level of care appropriate due to severity of illness   Dispo: The patient is from: Home              Anticipated d/c is to: TBD              Anticipated d/c date is: > 1 days              Patient currently is medically stable to  d/c.  Barriers to discharge: Awaiting SNF bed  Antimicorbials  :    Anti-infectives (From admission, onward)   Start     Dose/Rate Route Frequency Ordered Stop   10/24/20 1000  remdesivir 100 mg in sodium chloride 0.9 % 100 mL IVPB  Status:  Discontinued       "  Followed by" Linked Group Details   100 mg 200 mL/hr over 30 Minutes Intravenous Daily 10/23/20 1509 10/26/20 1111   10/23/20 1600  remdesivir 200 mg in sodium chloride 0.9% 250 mL IVPB       "Followed by" Linked Group Details   200 mg 580 mL/hr over 30 Minutes Intravenous Once 10/23/20 1509 10/23/20 1922   10/23/20 1215  cefTRIAXone (ROCEPHIN) 1 g in sodium chloride 0.9 % 100 mL IVPB        1 g 200 mL/hr over 30 Minutes Intravenous  Once 10/23/20 1206 10/23/20 1307   10/23/20 1215  azithromycin (ZITHROMAX) 500 mg in sodium chloride 0.9 % 250 mL IVPB        500 mg 250 mL/hr over 60 Minutes Intravenous  Once 10/23/20 1206 10/23/20 1341      Inpatient Medications  Scheduled Meds: . albuterol  2 puff Inhalation Q6H  . vitamin C  500 mg Oral Daily  . Chlorhexidine Gluconate Cloth  6 each Topical Daily  . cholecalciferol  1,000 Units Oral Daily  . digoxin  0.0625 mg Oral Daily  . metoprolol tartrate  50 mg Oral BID  . multivitamin with minerals  1 tablet Oral Daily  . pantoprazole  40 mg Oral BID  . sodium chloride flush  3 mL Intravenous Q12H  . tamsulosin  0.4 mg Oral Daily  . vitamin B-12  1,000 mcg Oral Daily  . zinc sulfate  220 mg Oral Daily   Continuous Infusions:  PRN Meds:.acetaminophen, albuterol, chlorpheniramine-HYDROcodone, diphenoxylate-atropine, guaiFENesin-dextromethorphan, LORazepam   Time Spent in minutes  25   See all Orders from today for further details  Lala Lund M.D on 10/31/2020 at 9:37 AM  To page go to [email or web address] - use universal password  [facility] -  Office  [phone]    Objective:   Vitals:   10/31/20 0004 10/31/20 0042 10/31/20 0400 10/31/20 0720  BP: (!)  137/111 120/61 115/69 135/70  Pulse: 73 72 72 80  Resp: (!) 22 20 20 18   Temp: 97.9 F (36.6 C) 97.9 F (36.6 C) 97.6 F (36.4 C) 97.6 F (36.4 C)  TempSrc: Axillary  Axillary Axillary  SpO2: 100% 98% 98% 100%  Weight:      Height:        Wt Readings from Last 3 Encounters:  10/23/20 62 kg  06/15/20 62.1 kg  01/08/20 63.5 kg     Intake/Output Summary (Last 24 hours) at 10/31/2020 0937 Last data filed at 10/31/2020 0430 Gross per 24 hour  Intake --  Output 525 ml  Net -525 ml     Physical Exam  Awake mildly confused, moving all 4 extremities, [place].AT,PERRAL Supple Neck,No JVD, No cervical lymphadenopathy appriciated.  Symmetrical Chest wall movement, Good air movement bilaterally, CTAB RRR,No Gallops, Rubs or new Murmurs, No Parasternal Heave +ve B.Sounds, Abd Soft with some suprapubic fullness, No Cyanosis, Clubbing or edema, No new Rash or bruise    Data Review:    CBC  Recent Labs  Lab 10/25/20 0223 10/25/20 0941 10/26/20 0247 10/26/20 1510 10/27/20 0051 10/27/20 1032 10/27/20 1818 10/28/20 0201 10/28/20 0425 10/29/20 0033 10/31/20 0329  WBC 12.2*   < > 11.9*   < > 9.8   < > 9.5 7.5 7.7 10.3 7.7  HGB 9.6*   < > 9.5*   < > 9.8*   < > 9.2* 9.0* 8.7* 9.8* 9.0*  HCT 29.2*   < > 29.3*   < > 29.4*   < > 27.8*  27.6* 26.0* 30.0* 27.0*  PLT 137*   < > 151   < > 177   < > 155 133* 134* 181 181  MCV 96.7   < > 95.4   < > 94.5   < > 94.2 95.5 93.5 95.8 93.1  MCH 31.8   < > 30.9   < > 31.5   < > 31.2 31.1 31.3 31.3 31.0  MCHC 32.9   < > 32.4   < > 33.3   < > 33.1 32.6 33.5 32.7 33.3  RDW 14.5   < > 14.3   < > 14.1   < > 14.0 13.7 13.9 13.9 13.7  LYMPHSABS 0.5*  --  0.4*  --  0.2*  --   --   --  0.3*  --   --   MONOABS 0.5  --  0.4  --  0.6  --   --   --  0.5  --   --   EOSABS 0.0  --  0.0  --  0.0  --   --   --  0.0  --   --   BASOSABS 0.0  --  0.0  --  0.0  --   --   --  0.0  --   --    < > = values in this interval not displayed.    Chemistries   Recent  Labs  Lab 10/25/20 0223 10/25/20 0223 10/26/20 0247 10/27/20 0051 10/28/20 0425 10/29/20 0033 10/31/20 0329  NA 136   < > 134* 134* 135 135 137  K 4.8   < > 4.3 4.0 4.4 4.1 3.9  CL 104   < > 102 98 102 101 101  CO2 22   < > 22 20* 22 25 25   GLUCOSE 156*   < > 157* 115* 83 114* 72  BUN 26*   < > 24* 27* 27* 27* 20  CREATININE 1.15   < > 0.99 1.35* 1.20 1.20 1.04  CALCIUM 8.6*   < > 8.5* 8.8* 8.6* 8.6* 8.5*  MG 2.4  --  2.2 2.3 2.2 2.3  --   AST 36  --  33 40 44*  --   --   ALT 24  --  23 27 30   --   --   ALKPHOS 29*  --  30* 35* 28*  --   --   BILITOT 0.8  --  1.0 1.4* 1.3*  --   --    < > = values in this interval not displayed.   ------------------------------------------------------------------------------------------------------------------ No results for input(s): CHOL, HDL, LDLCALC, TRIG, CHOLHDL, LDLDIRECT in the last 72 hours.  Lab Results  Component Value Date   HGBA1C  06/11/2007    5.7 (NOTE)   The ADA recommends the following therapeutic goals for glycemic   control related to Hgb A1C measurement:   Goal of Therapy:   < 7.0% Hgb A1C   Action Suggested:  > 8.0% Hgb A1C   Ref:  Diabetes Care, 22, Suppl. 1, 1999   ------------------------------------------------------------------------------------------------------------------ No results for input(s): TSH, T4TOTAL, T3FREE, THYROIDAB in the last 72 hours.  Invalid input(s): FREET3 ------------------------------------------------------------------------------------------------------------------ No results for input(s): VITAMINB12, FOLATE, FERRITIN, TIBC, IRON, RETICCTPCT in the last 72 hours.  Coagulation profile No results for input(s): INR, PROTIME in the last 168 hours.  No results for input(s): DDIMER in the last 72 hours.  Cardiac Enzymes No results for input(s): CKMB, TROPONINI, MYOGLOBIN in the last 168 hours.  Invalid  input(s):  CK ------------------------------------------------------------------------------------------------------------------    Component Value Date/Time   BNP 541.6 (H) 10/23/2020 1640   BNP 379.4 (H) 02/25/2016 0951    Micro Results Recent Results (from the past 240 hour(s))  Urine culture     Status: None   Collection Time: 10/23/20 11:19 AM   Specimen: In/Out Cath Urine  Result Value Ref Range Status   Specimen Description IN/OUT CATH URINE  Final   Special Requests NONE  Final   Culture   Final    NO GROWTH Performed at [facility], [place] [address], [place], [place] [address]    Report Status 10/25/2020 FINAL  Final  Blood culture (routine x 2)     Status: None   Collection Time: 10/23/20 11:29 AM   Specimen: BLOOD  Result Value Ref Range Status   Specimen Description BLOOD RIGHT ANTECUBITAL  Final   Special Requests   Final    BOTTLES DRAWN AEROBIC AND ANAEROBIC Blood Culture adequate volume   Culture   Final    NO GROWTH 5 DAYS Performed at [facility], [place] [address], [place], [place] [address]    Report Status 10/28/2020 FINAL  Final  Blood culture (routine x 2)     Status: None   Collection Time: 10/23/20 11:30 AM   Specimen: BLOOD  Result Value Ref Range Status   Specimen Description BLOOD LEFT ANTECUBITAL  Final   Special Requests   Final    BOTTLES DRAWN AEROBIC AND ANAEROBIC Blood Culture adequate volume   Culture   Final    NO GROWTH 5 DAYS Performed at [facility], [place] [address] [place], [place] [address]    Report Status 10/28/2020 FINAL  Final  Resp Panel by RT-PCR (Flu A&B, Covid) Nasopharyngeal Swab     Status: Abnormal   Collection Time: 10/23/20 12:02 PM   Specimen: Nasopharyngeal Swab; Nasopharyngeal(NP) swabs in vial transport medium  Result Value Ref Range Status   SARS Coronavirus 2 by RT PCR POSITIVE (A) NEGATIVE Final    Comment: emailed L. Berdik RN 14:25 10/23/20 ([email or web address]) (NOTE) SARS-CoV-2 target nucleic acids are  DETECTED.  The SARS-CoV-2 RNA is generally detectable in upper respiratory specimens during the acute phase of infection. Positive results are indicative of the presence of the identified virus, but do not rule out bacterial infection or co-infection with other pathogens not detected by the test. Clinical correlation with patient history and other diagnostic information is necessary to determine patient infection status. The expected result is Negative.  Fact Sheet for Patients: EntrepreneurPulse.com.au  Fact Sheet for Healthcare Providers: IncredibleEmployment.be  This test is not yet approved or cleared by the [place] FDA and  has been authorized for detection and/or diagnosis of SARS-CoV-2 by FDA under an Emergency Use Authorization (EUA).  This EUA will remain in effect (meaning this test can be used) for the duration of  the COVID- 19 declaration under Section 564(b)(1) of the Act, 21 U.S.C. section 360bbb-3(b)(1), unless the authorization is terminated or revoked sooner.     Influenza A by PCR NEGATIVE NEGATIVE Final   Influenza B by PCR NEGATIVE NEGATIVE Final    Comment: (NOTE) The Xpert Xpress SARS-CoV-2/FLU/RSV plus assay is intended as an aid in the diagnosis of influenza from Nasopharyngeal swab specimens and should not be used as a sole basis for treatment. Nasal washings and aspirates are unacceptable for Xpert Xpress SARS-CoV-2/FLU/RSV testing.  Fact Sheet for Patients: EntrepreneurPulse.com.au  Fact Sheet for Healthcare Providers: IncredibleEmployment.be  This test is not yet approved or cleared by the [place] and has been authorized for detection and/or diagnosis of SARS-CoV-2 by FDA under an Emergency Use Authorization (EUA). This EUA will remain in effect (meaning this test can be used) for the duration of the COVID-19 declaration under Section 564(b)(1) of the Act, 21  U.S.C. section 360bbb-3(b)(1), unless the authorization is terminated or revoked.  Performed at [facility], [place] [address] [place], [place] [address]     Radiology Reports DG Chest [place] 1 View  Result Date: 10/23/2020 CLINICAL DATA:  Questionable sepsis. EXAM: PORTABLE CHEST 1 VIEW COMPARISON:  October 14, 2013. FINDINGS: Similar mildly enlarged cardiac silhouette with median sternotomy. Pulmonary vascular congestion. Left subclavian approach cardiac rhythm maintenance device. Aortic atherosclerosis. Streaky left basilar opacities. No confluent consolidation. No visible pleural effusions or pneumothorax. No acute osseous abnormality. IMPRESSION: 1. Low lung volumes with streaky left basilar opacities, which may represent atelectasis, aspiration, and/or pneumonia. No confluent consolidation. 2. Mild cardiomegaly and pulmonary vascular congestion. Electronically Signed   By: Margaretha Sheffield MD   On: 10/23/2020 11:54   DG Chest [place] 1V same Day  Result Date: 10/24/2020 CLINICAL DATA:  Reported COVID-19 positive EXAM: PORTABLE CHEST 1 VIEW COMPARISON:  October 23, 2020 FINDINGS: There is ill-defined opacity in the right base and left mid lung regions. Left midlung ill-defined opacity is partly obscured by pacemaker. There is mild left base atelectasis. No consolidation. Heart is upper normal in size with pulmonary vascularity normal. Pacemaker leads are attached to the right atrium and right ventricle. Patient is status post coronary artery bypass grafting. There is aortic atherosclerosis. No adenopathy no evident bone lesions. IMPRESSION: Areas of airspace opacity in the right base and left mid lung regions which potentially may represent atypical organism pneumonia, particularly given the clinical history. Mild left base atelectasis. No consolidation. Heart upper normal in size, stable. Pacemaker leads attached to right atrium and right ventricle. Status post coronary artery bypass  grafting. Aortic Atherosclerosis (ICD10-I70.0). Electronically Signed   By: Lowella Grip III M.D.   On: 10/24/2020 09:53   ECHOCARDIOGRAM COMPLETE  Result Date: 10/25/2020    ECHOCARDIOGRAM REPORT   Patient Name:   [name] [name] Date of Exam: 10/25/2020 Medical Rec #:  [id number]      Height:       69.0 in Accession #:    [id number]     Weight:       136.7 lb Date of Birth:  11-Feb-1932      BSA:          1.757 m Patient Age:    14 years       BP:           101/83 mmHg Patient Gender: M              HR:           100 bpm. Exam Location:  Inpatient Procedure: 2D Echo, Cardiac Doppler and Color Doppler Indications:    I48.91* Unspeicified atrial fibrillation  History:        Patient has prior history of Echocardiogram examinations, most                 recent 01/04/2016. Prior CABG. Covid19 positive.  Sonographer:    Merrie Roof RDCS Referring Phys: [place]  1. Left ventricular ejection fraction, by estimation, is 25 to 30%. The left ventricle has severely decreased function. The left ventricle demonstrates global  hypokinesis. Left ventricular diastolic function could not be evaluated. There is severe akinesis of the left ventricular, basal-mid inferior and inferolateral walls. Incoordinate (paced) septal motion.  2. Right ventricular systolic function is moderately reduced. The right ventricular size is mildly enlarged. There is severely elevated pulmonary artery systolic pressure. The estimated right ventricular systolic pressure is 81.2 mmHg.  3. Left atrial size was massively dilated.  4. Right atrial size was moderately dilated.  5. Mild mitral subvalvular calcification.  6. Mild mitral subvalvular thickening/fibrosis.  7. Tethering of the mitral valve posterior leaflet.  8. The mitral valve is abnormal. Severe mitral valve regurgitation.  9. The tricuspid valve is abnormal. Tricuspid valve regurgitation is moderate. 10. The aortic valve is tricuspid. Aortic valve regurgitation  is mild. Mild aortic valve sclerosis is present, with no evidence of aortic valve stenosis. 11. Moderately dilated pulmonary artery. 12. The inferior vena cava is dilated in size with <50% respiratory variability, suggesting right atrial pressure of 15 mmHg. Comparison(s): Changes from prior study are noted. 01/04/2016: LVEF 40-45%, pacer wire, mild AI, moderate MR, moderate LAE, mild RAE. FINDINGS  Left Ventricle: Left ventricular ejection fraction, by estimation, is 25 to 30%. The left ventricle has severely decreased function. The left ventricle demonstrates global hypokinesis. Severe akinesis of the left ventricular, basal-mid inferior wall and  inferolateral wall. The left ventricular internal cavity size was normal in size. There is no left ventricular hypertrophy. Abnormal (paradoxical) septal motion, consistent with RV pacemaker. Left ventricular diastolic function could not be evaluated due to atrial fibrillation. Left ventricular diastolic function could not be evaluated. Right Ventricle: The right ventricular size is mildly enlarged. No increase in right ventricular wall thickness. Right ventricular systolic function is moderately reduced. There is severely elevated pulmonary artery systolic pressure. The tricuspid regurgitant velocity is 3.87 m/s, and with an assumed right atrial pressure of 15 mmHg, the estimated right ventricular systolic pressure is 75.1 mmHg. Left Atrium: Left atrial size was massively dilated. Right Atrium: Right atrial size was moderately dilated. Pericardium: There is no evidence of pericardial effusion. Mitral Valve: The mitral valve is abnormal. There is mild thickening of the mitral valve leaflet(s). Mild mitral subvalvular calcification. Mild mitral subvalvular thickening/fibrosis. Tethering involving the posterior leaflet is noted. Severe mitral valve regurgitation, with eccentric posteriorly directed jet. Tricuspid Valve: The tricuspid valve is abnormal. Tricuspid valve  regurgitation is moderate. Aortic Valve: The aortic valve is tricuspid. Aortic valve regurgitation is mild. Mild aortic valve sclerosis is present, with no evidence of aortic valve stenosis. Pulmonic Valve: The pulmonic valve was grossly normal. Pulmonic valve regurgitation is mild. Aorta: The aortic root and ascending aorta are structurally normal, with no evidence of dilitation. Pulmonary Artery: The pulmonary artery is moderately dilated. Venous: The inferior vena cava is dilated in size with less than 50% respiratory variability, suggesting right atrial pressure of 15 mmHg. IAS/Shunts: No atrial level shunt detected by color flow Doppler. Additional Comments: A pacer wire is visualized.  LEFT VENTRICLE PLAX 2D LVIDd:         5.40 cm LVIDs:         4.80 cm LV PW:         0.90 cm LV IVS:        1.00 cm  RIGHT VENTRICLE         IVC TAPSE (M-mode): 1.8 cm  IVC diam: 2.00 cm LEFT ATRIUM              Index  RIGHT ATRIUM           Index LA diam:        4.60 cm  2.62 cm/m   RA Area:     23.50 cm LA Vol (A2C):   184.0 ml 104.70 ml/m RA Volume:   71.40 ml  40.63 ml/m LA Vol (A4C):   161.0 ml 91.61 ml/m LA Biplane Vol: 170.0 ml 96.73 ml/m  AORTIC VALVE LVOT Vmax:   73.10 cm/s LVOT Vmean:  44.100 cm/s LVOT VTI:    0.122 m  AORTA Ao Root diam: 3.60 cm Ao Asc diam:  3.40 cm MR Peak grad:    136.9 mmHg  TRICUSPID VALVE MR Mean grad:    60.0 mmHg   TR Peak grad:   59.9 mmHg MR Vmax:         585.00 cm/s TR Vmax:        387.00 cm/s MR Vmean:        349.0 cm/s MR PISA:         3.08 cm    SHUNTS MR PISA Eff ROA: 17 mm      Systemic VTI: 0.12 m MR PISA Radius:  0.70 cm Lyman Bishop MD Electronically signed by Lyman Bishop MD Signature Date/Time: 10/25/2020/4:41:18 PM    Final

## 2020-10-31 NOTE — Progress Notes (Signed)
Pharmacy: digoxin   Digoxin resumed 11/29 with 0.5 mg IV x 1.  PTA regimen of 0.0625 daily resumed 11/30.  Digoxin level 1.2 today.     Plan:   - Decrease Digoxin to 0.0625 mg every other day per d/w R. Barrett, PA-C.   - Repeat digoxin level in 1 week.  Arty Baumgartner, [place] Phone: [phone] 10/31/2020 12:44 PM

## 2020-10-31 NOTE — Progress Notes (Addendum)
Pt has not voided to this point. Bladder scan performed, showing 150m. Will continue to monitor. Condom cath is in place.   @0430  Bladder scan performed at this time showing 495 ml. MD paged and in and out catheretization attempted. Pt in extreme amount of discomfort. In and out catheter unsuccessful. Sterile technique maintained. Resistance met and blood noted in tip of catheter. Awaiting orders from MD.    0600 No new orders received. Will pass to oncoming RN.

## 2020-11-01 DIAGNOSIS — I472 Ventricular tachycardia: Secondary | ICD-10-CM | POA: Diagnosis not present

## 2020-11-01 DIAGNOSIS — A419 Sepsis, unspecified organism: Secondary | ICD-10-CM | POA: Diagnosis not present

## 2020-11-01 DIAGNOSIS — K922 Gastrointestinal hemorrhage, unspecified: Secondary | ICD-10-CM | POA: Diagnosis not present

## 2020-11-01 DIAGNOSIS — U071 COVID-19: Secondary | ICD-10-CM | POA: Diagnosis not present

## 2020-11-01 MED ORDER — ALBUTEROL SULFATE HFA 108 (90 BASE) MCG/ACT IN AERS: 2.0000 | INHALATION_SPRAY | Freq: Four times a day (QID) | RESPIRATORY_TRACT | Status: DC | PRN

## 2020-11-01 NOTE — Progress Notes (Signed)
PROGRESS NOTE                                                                                                                                                                                                             Patient Demographics:    [name] [name], is a 86 y.o. male, DOB - 03-01-1932, XHB:[id number]  Outpatient Primary MD for the patient is Crist Infante, MD   Admit date - 10/23/2020   LOS - 9  Chief Complaint  Patient presents with  . Weakness       Brief Narrative: Patient is a 86 y.o. male with PMHx of CAD s/p CABG in 2002, s/p PPM implantation, chronic systolic heart failure, chronic atrial fibrillation-not on anticoagulation (history of recurrent GI bleeding), history of colon cancer, history of recurrent GI bleeding-who presented with a 3-4-day history of weakness, dark appearing stools.  In the emergency room-he was found to be febrile.  While in the ED-he had an episode of wide-complex tachycardia associated with hypotension-required emergent cardioversion.  He was also found to be COVID-19 positive and acute urinary retention.  He was subsequently admitted to the hospitalist service.  See below for further details.  COVID-19 vaccinated status: Vaccinated (but not a booster)  Significant Events: 11/26>> Admit to [facility] for generalized weakness, fever, and dark-colored stools-COVID-19 positive.  Had episode of wide-complex tachycardia with hypotension requiring emergent cardioversion. 11/26>> acute urinary retention-difficult Foley catheter -18 French coud catheter placed by urology. 12/3>> Foley removed-voiding trial in progress.  Significant studies: 11/26>>Chest x-ray: Streaky left basilar opacities, mild cardiomegaly and pulmonary vascular congestion. 11/27>> chest x-ray: Bilateral patchy opacities. 11/28>> Echo: EF 25-30%.  RV systolic function moderately reduced.  COVID-19 medications: Steroids:  11/26>>12/2 Remdesivir: 11/26>>11/30  Antibiotics: None  Microbiology data: 11/26 >>blood culture: No growth  Procedures: 11/26>> cardioversion by ED MD for wide-complex tachycardia (presumed V. tach) associated with hypotension.  Consults: Cardiology, GI, Urology  DVT prophylaxis:  SCDs Start: 10/23/20 1506   Subjective:   Patient in bed, appears comfortable, denies any headache, no fever, no chest pain or pressure, no shortness of breath , no abdominal pain. No focal weakness.    Assessment  & Plan :   Acute Hypoxic Resp Failure due to Covid 19 Viral pneumonia: Improved-remains on room air-has completed a course of steroids and Remdesivir.  Prone/Incentive Spirometry: encouraged  incentive spirometry use 3-4/hour.  Sepsis: Likely secondary to COVID-19 infection-sepsis physiology has resolved. Cultures negative so far.  Wide-complex tachycardia: Reviewed ED/cardiology notes-concern for V. tach-cardioverted by EDMD on admission on 11/26.  Subsequently started on amiodarone infusion that has been since discontinued.  Currently stable on beta-blocker and digoxin.  Chronic atrial fibrillation with RVR [name] of greater than 3 has a Pacemaker : Rate better controlled today-amiodarone infusion discontinued on 11/30-rate controlled with beta-blocker/digoxin.  Not a candidate for anticoagulation due to issues with GI bleeding.  Cardiology following.    Chronic systolic heart failure EF 35%, severe MR : Euvolemic-does not require diuretics. Cards following, follow post DC - Dr [name].  GI bleeding: No overt GI bleeding for the past several days. Hemoglobin stable-GI signed off.  Continue PPI.  Acute urinary retention: S/p coud catheter insertion by urology on 11/26-Flomax on board, as per recommendation Foley was removed after the week on 10/30/2020 unfortunately has developed retention again.  Urology replaced Foley on 10/31/2020.  Deconditioning: Secondary to acute  illness-superimposed on some amount of chronic debility.  Plans are for either SNF versus home health on discharge.  Social worker following.  Delirium: Per patient's daughter-patient does get confused in the hospital.  He continues to have waxing and waning sundowning/delirium-this morning he is relatively awake and alert-and significantly less confused than yesterday.  No longer on steroids-they were discontinued on 12/2.  Continue to maintain delirium precautions-continue to expect some amount of delirium-as long as he remains hospitalized.    Goals of care: Discussed with patient earlier during the hospital stay when he was a little bit more coherent-and subsequently with patient's daughter multiple times-continue medical treatment-DNI in place-okay for CPR/defibrillation/ACLS medications.    RN pressure injury documentation: Pressure Injury 10/23/20 Coccyx (Active)  10/23/20 1600  Location: Coccyx  Location Orientation:   Staging:   Wound Description (Comments):   Present on Admission:      GI prophylaxis: PPI  Condition - Fair  Family Communication  : Daughter [name]  [phone]) on 10/31/20, 11/01/20  Diet :  Diet Order            DIET SOFT Room service appropriate? No; Fluid consistency: Thin  Diet effective now                  Disposition Plan  :  Status is: Inpatient  Remains inpatient appropriate because:Inpatient level of care appropriate due to severity of illness   Dispo: The patient is from: Home              Anticipated d/c is to: TBD              Anticipated d/c date is: > 1 days              Patient currently is medically stable to d/c.  Barriers to discharge: Awaiting SNF bed  Antimicorbials  :    Anti-infectives (From admission, onward)   Start     Dose/Rate Route Frequency Ordered Stop   10/24/20 1000  remdesivir 100 mg in sodium chloride 0.9 % 100 mL IVPB  Status:  Discontinued       "Followed by" Linked Group Details   100 mg 200 mL/hr over  30 Minutes Intravenous Daily 10/23/20 1509 10/26/20 1111   10/23/20 1600  remdesivir 200 mg in sodium chloride 0.9% 250 mL IVPB       "Followed by" Linked Group Details   200 mg 580 mL/hr over 30 Minutes  Intravenous Once 10/23/20 1509 10/23/20 1922   10/23/20 1215  cefTRIAXone (ROCEPHIN) 1 g in sodium chloride 0.9 % 100 mL IVPB        1 g 200 mL/hr over 30 Minutes Intravenous  Once 10/23/20 1206 10/23/20 1307   10/23/20 1215  azithromycin (ZITHROMAX) 500 mg in sodium chloride 0.9 % 250 mL IVPB        500 mg 250 mL/hr over 60 Minutes Intravenous  Once 10/23/20 1206 10/23/20 1341      Inpatient Medications  Scheduled Meds: . vitamin C  500 mg Oral Daily  . Chlorhexidine Gluconate Cloth  6 each Topical Daily  . cholecalciferol  1,000 Units Oral Daily  . [START ON 11/02/2020] digoxin  0.0625 mg Oral QODAY  . metoprolol tartrate  50 mg Oral BID  . multivitamin with minerals  1 tablet Oral Daily  . pantoprazole  40 mg Oral BID  . sodium chloride flush  3 mL Intravenous Q12H  . tamsulosin  0.4 mg Oral Daily  . vitamin B-12  1,000 mcg Oral Daily  . zinc sulfate  220 mg Oral Daily   Continuous Infusions:  PRN Meds:.acetaminophen, albuterol, chlorpheniramine-HYDROcodone, diphenoxylate-atropine, guaiFENesin-dextromethorphan, LORazepam   Time Spent in minutes  25   See all Orders from today for further details  Lala Lund M.D on 11/01/2020 at 10:50 AM  To page go to [email or web address] - use universal password  [facility] -  Office  [phone]    Objective:   Vitals:   10/31/20 2106 11/01/20 0005 11/01/20 0339 11/01/20 0733  BP: 105/61 (!) 103/57 121/73 124/61  Pulse: 79 76 77 78  Resp:  16 16 18   Temp:  98 F (36.7 C) 98.4 F (36.9 C) 98.5 F (36.9 C)  TempSrc:  Axillary Axillary Axillary  SpO2: 99% 100% 98% 100%  Weight:      Height:        Wt Readings from Last 3 Encounters:  10/23/20 62 kg  06/15/20 62.1 kg  01/08/20 63.5 kg     Intake/Output Summary  (Last 24 hours) at 11/01/2020 1050 Last data filed at 10/31/2020 2300 Gross per 24 hour  Intake 100 ml  Output 1050 ml  Net -950 ml     Physical Exam  Awake less confused, No new F.N deficits,  Foley in place [place].AT,PERRAL Supple Neck,No JVD, No cervical lymphadenopathy appriciated.  Symmetrical Chest wall movement, Good air movement bilaterally, CTAB RRR,No Gallops, Rubs or new Murmurs, No Parasternal Heave +ve B.Sounds, Abd Soft, No tenderness, No organomegaly appriciated, No rebound - guarding or rigidity. No Cyanosis, Clubbing or edema, No new Rash or bruise     Data Review:    CBC  Recent Labs  Lab 10/26/20 0247 10/26/20 1510 10/27/20 0051 10/27/20 1032 10/27/20 1818 10/28/20 0201 10/28/20 0425 10/29/20 0033 10/31/20 0329  WBC 11.9*   < > 9.8   < > 9.5 7.5 7.7 10.3 7.7  HGB 9.5*   < > 9.8*   < > 9.2* 9.0* 8.7* 9.8* 9.0*  HCT 29.3*   < > 29.4*   < > 27.8* 27.6* 26.0* 30.0* 27.0*  PLT 151   < > 177   < > 155 133* 134* 181 181  MCV 95.4   < > 94.5   < > 94.2 95.5 93.5 95.8 93.1  MCH 30.9   < > 31.5   < > 31.2 31.1 31.3 31.3 31.0  MCHC 32.4   < > 33.3   < > 33.1 32.6 33.5  32.7 33.3  RDW 14.3   < > 14.1   < > 14.0 13.7 13.9 13.9 13.7  LYMPHSABS 0.4*  --  0.2*  --   --   --  0.3*  --   --   MONOABS 0.4  --  0.6  --   --   --  0.5  --   --   EOSABS 0.0  --  0.0  --   --   --  0.0  --   --   BASOSABS 0.0  --  0.0  --   --   --  0.0  --   --    < > = values in this interval not displayed.    Chemistries   Recent Labs  Lab 10/26/20 0247 10/27/20 0051 10/28/20 0425 10/29/20 0033 10/31/20 0329  NA 134* 134* 135 135 137  K 4.3 4.0 4.4 4.1 3.9  CL 102 98 102 101 101  CO2 22 20* 22 25 25   GLUCOSE 157* 115* 83 114* 72  BUN 24* 27* 27* 27* 20  CREATININE 0.99 1.35* 1.20 1.20 1.04  CALCIUM 8.5* 8.8* 8.6* 8.6* 8.5*  MG 2.2 2.3 2.2 2.3  --   AST 33 40 44*  --   --   ALT 23 27 30   --   --   ALKPHOS 30* 35* 28*  --   --   BILITOT 1.0 1.4* 1.3*  --   --     ------------------------------------------------------------------------------------------------------------------ No results for input(s): CHOL, HDL, LDLCALC, TRIG, CHOLHDL, LDLDIRECT in the last 72 hours.  Lab Results  Component Value Date   HGBA1C  06/11/2007    5.7 (NOTE)   The ADA recommends the following therapeutic goals for glycemic   control related to Hgb A1C measurement:   Goal of Therapy:   < 7.0% Hgb A1C   Action Suggested:  > 8.0% Hgb A1C   Ref:  Diabetes Care, 22, Suppl. 1, 1999   ------------------------------------------------------------------------------------------------------------------ No results for input(s): TSH, T4TOTAL, T3FREE, THYROIDAB in the last 72 hours.  Invalid input(s): FREET3 ------------------------------------------------------------------------------------------------------------------ No results for input(s): VITAMINB12, FOLATE, FERRITIN, TIBC, IRON, RETICCTPCT in the last 72 hours.  Coagulation profile No results for input(s): INR, PROTIME in the last 168 hours.  No results for input(s): DDIMER in the last 72 hours.  Cardiac Enzymes No results for input(s): CKMB, TROPONINI, MYOGLOBIN in the last 168 hours.  Invalid input(s): CK ------------------------------------------------------------------------------------------------------------------    Component Value Date/Time   BNP 541.6 (H) 10/23/2020 1640   BNP 379.4 (H) 02/25/2016 0951    Micro Results Recent Results (from the past 240 hour(s))  Urine culture     Status: None   Collection Time: 10/23/20 11:19 AM   Specimen: In/Out Cath Urine  Result Value Ref Range Status   Specimen Description IN/OUT CATH URINE  Final   Special Requests NONE  Final   Culture   Final    NO GROWTH Performed at [facility], [place] [address], [place], [place] [address]    Report Status 10/25/2020 FINAL  Final  Blood culture (routine x 2)     Status: None   Collection Time: 10/23/20 11:29 AM    Specimen: BLOOD  Result Value Ref Range Status   Specimen Description BLOOD RIGHT ANTECUBITAL  Final   Special Requests   Final    BOTTLES DRAWN AEROBIC AND ANAEROBIC Blood Culture adequate volume   Culture   Final    NO GROWTH 5 DAYS Performed at [facility], [place] [address] [place], [place] [address]    Report Status 10/28/2020 FINAL  Final  Blood culture (routine x 2)     Status: None   Collection Time: 10/23/20 11:30 AM   Specimen: BLOOD  Result Value Ref Range Status   Specimen Description BLOOD LEFT ANTECUBITAL  Final   Special Requests   Final    BOTTLES DRAWN AEROBIC AND ANAEROBIC Blood Culture adequate volume   Culture   Final    NO GROWTH 5 DAYS Performed at [facility], [place] [address] [place], [place] [address]    Report Status 10/28/2020 FINAL  Final  Resp Panel by RT-PCR (Flu A&B, Covid) Nasopharyngeal Swab     Status: Abnormal   Collection Time: 10/23/20 12:02 PM   Specimen: Nasopharyngeal Swab; Nasopharyngeal(NP) swabs in vial transport medium  Result Value Ref Range Status   SARS Coronavirus 2 by RT PCR POSITIVE (A) NEGATIVE Final    Comment: emailed L. Berdik RN 14:25 10/23/20 ([email or web address]) (NOTE) SARS-CoV-2 target nucleic acids are DETECTED.  The SARS-CoV-2 RNA is generally detectable in upper respiratory specimens during the acute phase of infection. Positive results are indicative of the presence of the identified virus, but do not rule out bacterial infection or co-infection with other pathogens not detected by the test. Clinical correlation with patient history and other diagnostic information is necessary to determine patient infection status. The expected result is Negative.  Fact Sheet for Patients: EntrepreneurPulse.com.au  Fact Sheet for Healthcare Providers: IncredibleEmployment.be  This test is not yet approved or cleared by the [place] FDA and  has been authorized for detection and/or  diagnosis of SARS-CoV-2 by FDA under an Emergency Use Authorization (EUA).  This EUA will remain in effect (meaning this test can be used) for the duration of  the COVID- 19 declaration under Section 564(b)(1) of the Act, 21 U.S.C. section 360bbb-3(b)(1), unless the authorization is terminated or revoked sooner.     Influenza A by PCR NEGATIVE NEGATIVE Final   Influenza B by PCR NEGATIVE NEGATIVE Final    Comment: (NOTE) The Xpert Xpress SARS-CoV-2/FLU/RSV plus assay is intended as an aid in the diagnosis of influenza from Nasopharyngeal swab specimens and should not be used as a sole basis for treatment. Nasal washings and aspirates are unacceptable for Xpert Xpress SARS-CoV-2/FLU/RSV testing.  Fact Sheet for Patients: EntrepreneurPulse.com.au  Fact Sheet for Healthcare Providers: IncredibleEmployment.be  This test is not yet approved or cleared by the [place] FDA and has been authorized for detection and/or diagnosis of SARS-CoV-2 by FDA under an Emergency Use Authorization (EUA). This EUA will remain in effect (meaning this test can be used) for the duration of the COVID-19 declaration under Section 564(b)(1) of the Act, 21 U.S.C. section 360bbb-3(b)(1), unless the authorization is terminated or revoked.  Performed at [facility], [place] [address] [place], [place] [address]     Radiology Reports DG Chest [place] 1 View  Result Date: 10/23/2020 CLINICAL DATA:  Questionable sepsis. EXAM: PORTABLE CHEST 1 VIEW COMPARISON:  October 14, 2013. FINDINGS: Similar mildly enlarged cardiac silhouette with median sternotomy. Pulmonary vascular congestion. Left subclavian approach cardiac rhythm maintenance device. Aortic atherosclerosis. Streaky left basilar opacities. No confluent consolidation. No visible pleural effusions or pneumothorax. No acute osseous abnormality. IMPRESSION: 1. Low lung volumes with streaky left basilar opacities,  which may represent atelectasis, aspiration, and/or pneumonia. No confluent consolidation. 2. Mild cardiomegaly and pulmonary vascular congestion. Electronically Signed   By: Margaretha Sheffield  MD   On: 10/23/2020 11:54   DG Chest Port 1V same Day  Result Date: 10/24/2020 CLINICAL DATA:  Reported COVID-19 positive EXAM: PORTABLE CHEST 1 VIEW COMPARISON:  October 23, 2020 FINDINGS: There is ill-defined opacity in the right base and left mid lung regions. Left midlung ill-defined opacity is partly obscured by pacemaker. There is mild left base atelectasis. No consolidation. Heart is upper normal in size with pulmonary vascularity normal. Pacemaker leads are attached to the right atrium and right ventricle. Patient is status post coronary artery bypass grafting. There is aortic atherosclerosis. No adenopathy no evident bone lesions. IMPRESSION: Areas of airspace opacity in the right base and left mid lung regions which potentially may represent atypical organism pneumonia, particularly given the clinical history. Mild left base atelectasis. No consolidation. Heart upper normal in size, stable. Pacemaker leads attached to right atrium and right ventricle. Status post coronary artery bypass grafting. Aortic Atherosclerosis (ICD10-I70.0). Electronically Signed   By: Lowella Grip III M.D.   On: 10/24/2020 09:53   ECHOCARDIOGRAM COMPLETE  Result Date: 10/25/2020    ECHOCARDIOGRAM REPORT   Patient Name:   RANDOLF SANSOUCIE Date of Exam: 10/25/2020 Medical Rec #:  [id number]      Height:       69.0 in Accession #:    [id number]     Weight:       136.7 lb Date of Birth:  Jun 19, 1932      BSA:          1.757 m Patient Age:    58 years       BP:           101/83 mmHg Patient Gender: M              HR:           100 bpm. Exam Location:  Inpatient Procedure: 2D Echo, Cardiac Doppler and Color Doppler Indications:    I48.91* Unspeicified atrial fibrillation  History:        Patient has prior history of Echocardiogram  examinations, most                 recent 01/04/2016. Prior CABG. Covid19 positive.  Sonographer:    Merrie Roof RDCS Referring Phys: [place]  1. Left ventricular ejection fraction, by estimation, is 25 to 30%. The left ventricle has severely decreased function. The left ventricle demonstrates global hypokinesis. Left ventricular diastolic function could not be evaluated. There is severe akinesis of the left ventricular, basal-mid inferior and inferolateral walls. Incoordinate (paced) septal motion.  2. Right ventricular systolic function is moderately reduced. The right ventricular size is mildly enlarged. There is severely elevated pulmonary artery systolic pressure. The estimated right ventricular systolic pressure is 89.3 mmHg.  3. Left atrial size was massively dilated.  4. Right atrial size was moderately dilated.  5. Mild mitral subvalvular calcification.  6. Mild mitral subvalvular thickening/fibrosis.  7. Tethering of the mitral valve posterior leaflet.  8. The mitral valve is abnormal. Severe mitral valve regurgitation.  9. The tricuspid valve is abnormal. Tricuspid valve regurgitation is moderate. 10. The aortic valve is tricuspid. Aortic valve regurgitation is mild. Mild aortic valve sclerosis is present, with no evidence of aortic valve stenosis. 11. Moderately dilated pulmonary artery. 12. The inferior vena cava is dilated in size with <50% respiratory variability, suggesting right atrial pressure of 15 mmHg. Comparison(s): Changes from prior study are noted. 01/04/2016: LVEF 40-45%, pacer wire, mild AI, moderate MR, moderate  LAE, mild RAE. FINDINGS  Left Ventricle: Left ventricular ejection fraction, by estimation, is 25 to 30%. The left ventricle has severely decreased function. The left ventricle demonstrates global hypokinesis. Severe akinesis of the left ventricular, basal-mid inferior wall and  inferolateral wall. The left ventricular internal cavity size was normal in  size. There is no left ventricular hypertrophy. Abnormal (paradoxical) septal motion, consistent with RV pacemaker. Left ventricular diastolic function could not be evaluated due to atrial fibrillation. Left ventricular diastolic function could not be evaluated. Right Ventricle: The right ventricular size is mildly enlarged. No increase in right ventricular wall thickness. Right ventricular systolic function is moderately reduced. There is severely elevated pulmonary artery systolic pressure. The tricuspid regurgitant velocity is 3.87 m/s, and with an assumed right atrial pressure of 15 mmHg, the estimated right ventricular systolic pressure is 12.8 mmHg. Left Atrium: Left atrial size was massively dilated. Right Atrium: Right atrial size was moderately dilated. Pericardium: There is no evidence of pericardial effusion. Mitral Valve: The mitral valve is abnormal. There is mild thickening of the mitral valve leaflet(s). Mild mitral subvalvular calcification. Mild mitral subvalvular thickening/fibrosis. Tethering involving the posterior leaflet is noted. Severe mitral valve regurgitation, with eccentric posteriorly directed jet. Tricuspid Valve: The tricuspid valve is abnormal. Tricuspid valve regurgitation is moderate. Aortic Valve: The aortic valve is tricuspid. Aortic valve regurgitation is mild. Mild aortic valve sclerosis is present, with no evidence of aortic valve stenosis. Pulmonic Valve: The pulmonic valve was grossly normal. Pulmonic valve regurgitation is mild. Aorta: The aortic root and ascending aorta are structurally normal, with no evidence of dilitation. Pulmonary Artery: The pulmonary artery is moderately dilated. Venous: The inferior vena cava is dilated in size with less than 50% respiratory variability, suggesting right atrial pressure of 15 mmHg. IAS/Shunts: No atrial level shunt detected by color flow Doppler. Additional Comments: A pacer wire is visualized.  LEFT VENTRICLE PLAX 2D LVIDd:          5.40 cm LVIDs:         4.80 cm LV PW:         0.90 cm LV IVS:        1.00 cm  RIGHT VENTRICLE         IVC TAPSE (M-mode): 1.8 cm  IVC diam: 2.00 cm LEFT ATRIUM              Index        RIGHT ATRIUM           Index LA diam:        4.60 cm  2.62 cm/m   RA Area:     23.50 cm LA Vol (A2C):   184.0 ml 104.70 ml/m RA Volume:   71.40 ml  40.63 ml/m LA Vol (A4C):   161.0 ml 91.61 ml/m LA Biplane Vol: 170.0 ml 96.73 ml/m  AORTIC VALVE LVOT Vmax:   73.10 cm/s LVOT Vmean:  44.100 cm/s LVOT VTI:    0.122 m  AORTA Ao Root diam: 3.60 cm Ao Asc diam:  3.40 cm MR Peak grad:    136.9 mmHg  TRICUSPID VALVE MR Mean grad:    60.0 mmHg   TR Peak grad:   59.9 mmHg MR Vmax:         585.00 cm/s TR Vmax:        387.00 cm/s MR Vmean:        349.0 cm/s MR PISA:         3.08 cm    SHUNTS MR  PISA Eff ROA: 17 mm      Systemic VTI: 0.12 m MR PISA Radius:  0.70 cm Lyman Bishop MD Electronically signed by Lyman Bishop MD Signature Date/Time: 10/25/2020/4:41:18 PM    Final

## 2020-11-02 DIAGNOSIS — K922 Gastrointestinal hemorrhage, unspecified: Secondary | ICD-10-CM | POA: Diagnosis not present

## 2020-11-02 DIAGNOSIS — U071 COVID-19: Secondary | ICD-10-CM | POA: Diagnosis not present

## 2020-11-02 MED ORDER — PANTOPRAZOLE SODIUM 40 MG PO TBEC
40.0000 mg | DELAYED_RELEASE_TABLET | Freq: Two times a day (BID) | ORAL | Status: DC
Start: 2020-11-02 — End: 2021-01-15

## 2020-11-02 MED ORDER — TAMSULOSIN HCL 0.4 MG PO CAPS
0.4000 mg | ORAL_CAPSULE | Freq: Every day | ORAL | 0 refills | Status: DC
Start: 2020-11-02 — End: 2021-01-15

## 2020-11-02 MED ORDER — RAMIPRIL 2.5 MG PO CAPS
2.5000 mg | ORAL_CAPSULE | Freq: Every day | ORAL | Status: DC
Start: 2020-11-02 — End: 2021-01-15

## 2020-11-02 MED ORDER — DIGOXIN 62.5 MCG PO TABS: 0.0625 mg | ORAL_TABLET | ORAL | Status: DC

## 2020-11-02 NOTE — Discharge Summary (Addendum)
[name] [name] JSE:[id number] DOB: [birth year] DOA: 10/23/2020  PCP: Crist Infante, MD  Admit date: 10/23/2020  Discharge date: 11/04/2020  Admitted From: Home  Disposition:  SNF   Recommendations for Outpatient Follow-up:   Follow up with PCP in 1-2 weeks  PCP Please obtain BMP/CBC, 2 view CXR in 1week,  (see Discharge instructions)   PCP Please follow up on the following pending results: Please arrange for outpatient cardiology, urology and GI follow-up within 1 to 2 weeks of discharge.   Home Health: None  Equipment/Devices: None  Consultations: Cards, Urology, GI Discharge Condition: Stable    CODE STATUS: No Intubation   Diet Recommendation: Soft  Diet Order            DIET SOFT Room service appropriate? No; Fluid consistency: Thin  Diet effective now                  Chief Complaint  Patient presents with  . Weakness     Brief history of present illness from the day of admission and additional interim summary     Patient is a 86 y.o. male with PMHx of CAD s/p CABG in 2002, s/p PPM implantation, chronic systolic heart failure, chronic atrial fibrillation-not on anticoagulation (history of recurrent GI bleeding), history of colon cancer, history of recurrent GI bleeding-who presented with a 3-4-day history of weakness, dark appearing stools.  In the emergency room-he was found to be febrile.  While in the ED-he had an episode of wide-complex tachycardia associated with hypotension-required emergent cardioversion.  He was also found to be COVID-19 positive and acute urinary retention.  He was subsequently admitted to the hospitalist service.  See below for further details.  COVID-19 vaccinated status: Vaccinated (but not a booster)  Significant Events: 11/26>> Admit to [facility] for generalized  weakness, fever, and dark-colored stools-COVID-19 positive.  Had episode of wide-complex tachycardia with hypotension requiring emergent cardioversion. 11/26>> acute urinary retention-difficult Foley catheter -18 French coud catheter placed by urology. 12/3>> Foley removed-voiding trial failed, New Foley 10/31/20  Significant studies: 11/26>>Chest x-ray: Streaky left basilar opacities, mild cardiomegaly and pulmonary vascular congestion. 11/27>> chest x-ray: Bilateral patchy opacities. 11/28>> Echo: EF 25-30%.  RV systolic function moderately reduced.  COVID-19 medications: Steroids: 11/26>>12/2 Remdesivir: 11/26>>11/30  Antibiotics: None  Microbiology data: 11/26 >>blood culture: No growth  Procedures: 11/26>> cardioversion by ED MD for wide-complex tachycardia (presumed V. tach) associated with hypotension.  Consults: Cardiology, GI, Urology                                                                  Hospital Course   cute Hypoxic Resp Failure due to Covid 19 Viral pneumonia: Improved-remains on room air-has completed a course of steroids and Remdesivir.  Symptom-free this problem has clinically resolved.   Recent  Labs  Lab 10/29/20 0033 10/31/20 0329  WBC 10.3 7.7      Sepsis: Likely secondary to COVID-19 infection-sepsis physiology has resolved. Cultures negative so far.  Wide-complex tachycardia: Reviewed ED/cardiology notes-concern for V. tach-cardioverted by EDMD on admission on 11/26.  Subsequently started on amiodarone infusion that has been since discontinued.  Currently stable on beta-blocker and digoxin.  Chronic atrial fibrillation with RVR [name] of greater than 3 has a Pacemaker : Rate better controlled today-amiodarone infusion discontinued on 11/30-rate controlled with beta-blocker/digoxin.  Not a candidate for anticoagulation due to issues with GI bleeding.    In by cardiology here and will follow with cardiology upon discharge.   Please check digoxin dose once every few weeks.   Chronic systolic heart failure EF 35%, severe MR : Euvolemic-does not require diuretics. Cards following, follow post DC - Dr [name].  GI bleeding: No overt GI bleeding for the past several days. Hemoglobin stable-GI signed off.  Continue PPI twice daily with outpatient GI follow-up.  Acute urinary retention: S/p coud catheter insertion by urology on 11/26-Flomax on board, as per recommendation Foley was removed after the week on 10/30/2020 unfortunately has developed retention again.  Urology placed Foley catheter on October 31, 2020, he will be discharged to SNF on Flomax and Foley catheter with outpatient urology follow-up in 1-2 weeks.  Deconditioning: Secondary to acute illness-superimposed on some amount of chronic debility.    Will need SNF.  Delirium: Per patient's daughter-patient does get confused in the hospital.  He has mild intermittent delirium now.  Minimize narcotics benzodiazepines, if needed use as needed or Haldol.   Discharge diagnosis     Active Problems:   GI bleed   COVID-19   Sepsis ([place])   Wide-complex tachycardia (HCC)   Macrocytic anemia   Hypoalbuminemia   Pressure injury of skin    Discharge instructions    Discharge Instructions    Increase activity slowly   Complete by: As directed    No dressing needed   Complete by: As directed       Discharge Medications   Allergies as of 11/04/2020      Reactions   Iron    Stomach issues   Lescol [fluvastatin Sodium]    Dizziness   Pentazocine Lactate    unknown   Vytorin [ezetimibe-simvastatin]    Leg cramps   Aspirin    Bleeding      Medication List    STOP taking these medications   temazepam 7.5 MG capsule Commonly known as: Restoril     TAKE these medications   Besivance 0.6 % Susp Generic drug: Besifloxacin HCl Place 1 drop into the right eye 3 (three) times daily.   cholecalciferol 1000 units tablet Commonly known as:  VITAMIN D Take 1,000 Units by mouth daily.   Digoxin 62.5 MCG Tabs Take 0.0625 mg by mouth every other day. What changed:   medication strength  when to take this   diphenoxylate-atropine 2.5-0.025 MG tablet Commonly known as: LOMOTIL Take 1 tablet by mouth 4 (four) times daily as needed for diarrhea or loose stools.   Durezol 0.05 % Emul Generic drug: Difluprednate Place 1 drop into the right eye 3 (three) times daily.   furosemide 40 MG tablet Commonly known as: LASIX TAKE 1 TABLET FIVE DAYS A WEEK AND 1/2 TABLET ON SATURDAY AND SUNDAY What changed: See the new instructions.   metoprolol succinate 100 MG 24 hr tablet Commonly known as: TOPROL-XL TAKE 1 TABLET (  100 MG TOTAL) BY MOUTH DAILY WITH OR IMMEDIATELY FOLLOWING A MEAL What changed:   how much to take  how to take this  when to take this  additional instructions   multivitamins ther. w/minerals Tabs tablet Take 1 tablet by mouth daily.   nitroGLYCERIN 0.4 MG SL tablet Commonly known as: NITROSTAT Place 1 tablet (0.4 mg total) under the tongue every 5 (five) minutes as needed for chest pain.   pantoprazole 40 MG tablet Commonly known as: PROTONIX Take 1 tablet (40 mg total) by mouth 2 (two) times daily.   ProAir HFA 108 (90 Base) MCG/ACT inhaler Generic drug: albuterol Inhale 2 puffs into the lungs every 4 (four) hours as needed for shortness of breath.   ramipril 2.5 MG capsule Commonly known as: ALTACE Take 1 capsule (2.5 mg total) by mouth daily. What changed:   medication strength  how much to take   spironolactone 25 MG tablet Commonly known as: ALDACTONE TAKE 1/2 TABLET EVERY DAY (NEED MD APPOINTMENT) What changed: See the new instructions.   tamsulosin 0.4 MG Caps capsule Commonly known as: FLOMAX Take 1 capsule (0.4 mg total) by mouth daily.   vitamin B-12 1000 MCG tablet Commonly known as: CYANOCOBALAMIN Take 1,000 mcg by mouth daily.            Discharge Care Instructions   (From admission, onward)         Start     Ordered   11/02/20 0000  No dressing needed        11/02/20 7408           Contact information for follow-up providers    Crist Infante, MD. Schedule an appointment as soon as possible for a visit in 1 week(s).   Specialty: Internal Medicine Contact information: [place] [address] [phone]        [name], Peter M, MD. Schedule an appointment as soon as possible for a visit in 1 week(s).   Specialty: Cardiology Contact information: [address] [place] [address] [phone]        [name], MD .   Specialty: Cardiology Contact information: [address] [place] [address] [phone]        Lavena Bullion, DO. Schedule an appointment as soon as possible for a visit in 1 week(s).   Specialty: Gastroenterology Contact information: [place] [address] [phone]        Cleon Gustin, MD. Schedule an appointment as soon as possible for a visit in 1 week(s).   Specialty: Urology Contact information: [place] [address] [phone]            Contact information for after-discharge care    Destination    [facility] .   Service: Skilled Nursing Contact information: [address] [phone]                  Major procedures and Radiology Reports - PLEASE review detailed and final reports thoroughly  -       DG Chest [facility] A 1 View  Result Date: 10/23/2020 CLINICAL DATA:  Questionable sepsis. EXAM: PORTABLE CHEST 1 VIEW COMPARISON:  October 14, 2013. FINDINGS: Similar mildly enlarged cardiac silhouette with median sternotomy. Pulmonary vascular congestion. Left subclavian approach cardiac rhythm maintenance device. Aortic atherosclerosis. Streaky left basilar opacities. No confluent consolidation. No visible pleural  effusions or pneumothorax. No  acute osseous abnormality. IMPRESSION: 1. Low lung volumes with streaky left basilar opacities, which may represent atelectasis, aspiration, and/or pneumonia. No confluent consolidation. 2. Mild cardiomegaly and pulmonary vascular congestion. Electronically Signed   By: Margaretha Sheffield MD   On: 10/23/2020 11:54   DG Chest Port 1V same Day  Result Date: 10/24/2020 CLINICAL DATA:  Reported COVID-19 positive EXAM: PORTABLE CHEST 1 VIEW COMPARISON:  October 23, 2020 FINDINGS: There is ill-defined opacity in the right base and left mid lung regions. Left midlung ill-defined opacity is partly obscured by pacemaker. There is mild left base atelectasis. No consolidation. Heart is upper normal in size with pulmonary vascularity normal. Pacemaker leads are attached to the right atrium and right ventricle. Patient is status post coronary artery bypass grafting. There is aortic atherosclerosis. No adenopathy no evident bone lesions. IMPRESSION: Areas of airspace opacity in the right base and left mid lung regions which potentially may represent atypical organism pneumonia, particularly given the clinical history. Mild left base atelectasis. No consolidation. Heart upper normal in size, stable. Pacemaker leads attached to right atrium and right ventricle. Status post coronary artery bypass grafting. Aortic Atherosclerosis (ICD10-I70.0). Electronically Signed   By: Lowella Grip III M.D.   On: 10/24/2020 09:53   ECHOCARDIOGRAM COMPLETE  Result Date: 10/25/2020    ECHOCARDIOGRAM REPORT   Patient Name:   [name] [name] Date of Exam: 10/25/2020 Medical Rec #:  [id number]      Height:       69.0 in Accession #:    [id number]     Weight:       136.7 lb Date of Birth:  04-15-1932      BSA:          1.757 m Patient Age:    70 years       BP:           101/83 mmHg Patient Gender: M              HR:           100 bpm. Exam Location:  Inpatient Procedure: 2D Echo, Cardiac Doppler and Color  Doppler Indications:    I48.91* Unspeicified atrial fibrillation  History:        Patient has prior history of Echocardiogram examinations, most                 recent 01/04/2016. Prior CABG. Covid19 positive.  Sonographer:    Merrie Roof RDCS Referring Phys: [place]  1. Left ventricular ejection fraction, by estimation, is 25 to 30%. The left ventricle has severely decreased function. The left ventricle demonstrates global hypokinesis. Left ventricular diastolic function could not be evaluated. There is severe akinesis of the left ventricular, basal-mid inferior and inferolateral walls. Incoordinate (paced) septal motion.  2. Right ventricular systolic function is moderately reduced. The right ventricular size is mildly enlarged. There is severely elevated pulmonary artery systolic pressure. The estimated right ventricular systolic pressure is 78.6 mmHg.  3. Left atrial size was massively dilated.  4. Right atrial size was moderately dilated.  5. Mild mitral subvalvular calcification.  6. Mild mitral subvalvular thickening/fibrosis.  7. Tethering of the mitral valve posterior leaflet.  8. The mitral valve is abnormal. Severe mitral valve regurgitation.  9. The tricuspid valve is abnormal. Tricuspid valve regurgitation is moderate. 10. The aortic valve is tricuspid. Aortic valve regurgitation is mild. Mild aortic valve sclerosis is present, with no evidence of aortic valve stenosis. 11. Moderately dilated pulmonary  artery. 12. The inferior vena cava is dilated in size with <50% respiratory variability, suggesting right atrial pressure of 15 mmHg. Comparison(s): Changes from prior study are noted. 01/04/2016: LVEF 40-45%, pacer wire, mild AI, moderate MR, moderate LAE, mild RAE. FINDINGS  Left Ventricle: Left ventricular ejection fraction, by estimation, is 25 to 30%. The left ventricle has severely decreased function. The left ventricle demonstrates global hypokinesis. Severe akinesis of the  left ventricular, basal-mid inferior wall and  inferolateral wall. The left ventricular internal cavity size was normal in size. There is no left ventricular hypertrophy. Abnormal (paradoxical) septal motion, consistent with RV pacemaker. Left ventricular diastolic function could not be evaluated due to atrial fibrillation. Left ventricular diastolic function could not be evaluated. Right Ventricle: The right ventricular size is mildly enlarged. No increase in right ventricular wall thickness. Right ventricular systolic function is moderately reduced. There is severely elevated pulmonary artery systolic pressure. The tricuspid regurgitant velocity is 3.87 m/s, and with an assumed right atrial pressure of 15 mmHg, the estimated right ventricular systolic pressure is 16.1 mmHg. Left Atrium: Left atrial size was massively dilated. Right Atrium: Right atrial size was moderately dilated. Pericardium: There is no evidence of pericardial effusion. Mitral Valve: The mitral valve is abnormal. There is mild thickening of the mitral valve leaflet(s). Mild mitral subvalvular calcification. Mild mitral subvalvular thickening/fibrosis. Tethering involving the posterior leaflet is noted. Severe mitral valve regurgitation, with eccentric posteriorly directed jet. Tricuspid Valve: The tricuspid valve is abnormal. Tricuspid valve regurgitation is moderate. Aortic Valve: The aortic valve is tricuspid. Aortic valve regurgitation is mild. Mild aortic valve sclerosis is present, with no evidence of aortic valve stenosis. Pulmonic Valve: The pulmonic valve was grossly normal. Pulmonic valve regurgitation is mild. Aorta: The aortic root and ascending aorta are structurally normal, with no evidence of dilitation. Pulmonary Artery: The pulmonary artery is moderately dilated. Venous: The inferior vena cava is dilated in size with less than 50% respiratory variability, suggesting right atrial pressure of 15 mmHg. IAS/Shunts: No atrial level  shunt detected by color flow Doppler. Additional Comments: A pacer wire is visualized.  LEFT VENTRICLE PLAX 2D LVIDd:         5.40 cm LVIDs:         4.80 cm LV PW:         0.90 cm LV IVS:        1.00 cm  RIGHT VENTRICLE         IVC TAPSE (M-mode): 1.8 cm  IVC diam: 2.00 cm LEFT ATRIUM              Index        RIGHT ATRIUM           Index LA diam:        4.60 cm  2.62 cm/m   RA Area:     23.50 cm LA Vol (A2C):   184.0 ml 104.70 ml/m RA Volume:   71.40 ml  40.63 ml/m LA Vol (A4C):   161.0 ml 91.61 ml/m LA Biplane Vol: 170.0 ml 96.73 ml/m  AORTIC VALVE LVOT Vmax:   73.10 cm/s LVOT Vmean:  44.100 cm/s LVOT VTI:    0.122 m  AORTA Ao Root diam: 3.60 cm Ao Asc diam:  3.40 cm MR Peak grad:    136.9 mmHg  TRICUSPID VALVE MR Mean grad:    60.0 mmHg   TR Peak grad:   59.9 mmHg MR Vmax:         585.00 cm/s TR  Vmax:        387.00 cm/s MR Vmean:        349.0 cm/s MR PISA:         3.08 cm    SHUNTS MR PISA Eff ROA: 17 mm      Systemic VTI: 0.12 m MR PISA Radius:  0.70 cm Lyman Bishop MD Electronically signed by Lyman Bishop MD Signature Date/Time: 10/25/2020/4:41:18 PM    Final     Micro Results    No results found for this or any previous visit (from the past 240 hour(s)).  Today   Subjective    [name] [name] today has no headache,no chest abdominal pain,no new weakness tingling or numbness, feels much better      Objective   Blood pressure (!) 108/51, pulse 66, temperature 98.2 F (36.8 C), temperature source Oral, resp. rate 15, height 5\' 9"  (1.753 m), weight 62 kg, SpO2 100 %.   Intake/Output Summary (Last 24 hours) at 11/04/2020 1453 Last data filed at 11/04/2020 0823 Gross per 24 hour  Intake 120 ml  Output 400 ml  Net -280 ml    Exam  Awake , mildly confused, No new F.N deficits,   [place].AT,PERRAL Supple Neck,No JVD, No cervical lymphadenopathy appriciated.  Symmetrical Chest wall movement, Good air movement bilaterally, CTAB RRR,No Gallops,Rubs or new Murmurs, No Parasternal  Heave +ve B.Sounds, Abd Soft, Non tender, No organomegaly appriciated, No rebound -guarding or rigidity. No Cyanosis, Clubbing or edema, foley in place.   Data Review   CBC w Diff:  Lab Results  Component Value Date   WBC 7.7 10/31/2020   HGB 9.0 (L) 10/31/2020   HGB 10.7 (L) 08/20/2018   HCT 27.0 (L) 10/31/2020   HCT 28.1 (L) 10/23/2020   PLT 181 10/31/2020   PLT 275 08/20/2018   LYMPHOPCT 3 10/28/2020   MONOPCT 7 10/28/2020   EOSPCT 0 10/28/2020   BASOPCT 0 10/28/2020    CMP:  Lab Results  Component Value Date   NA 137 10/31/2020   NA 139 08/20/2018   K 3.9 10/31/2020   CL 101 10/31/2020   CO2 25 10/31/2020   BUN 20 10/31/2020   BUN 21 08/20/2018   CREATININE 1.04 10/31/2020   CREATININE 1.12 (H) 02/25/2016   PROT 4.9 (L) 10/28/2020   ALBUMIN 2.8 (L) 10/28/2020   BILITOT 1.3 (H) 10/28/2020   ALKPHOS 28 (L) 10/28/2020   AST 44 (H) 10/28/2020   ALT 30 10/28/2020  .   Total Time in preparing paper work, data evaluation and todays exam - 81 minutes  Lala Lund M.D on 11/04/2020 at 2:53 PM  [facility]   Office  [phone]

## 2020-11-02 NOTE — Discharge Instructions (Signed)
Follow with Primary MD Crist Infante, MD in 7 days   Get CBC, CMP, 2 view Chest X ray -  checked next visit within 1 week by Primary MD or SNF MD   Activity: As tolerated with Full fall precautions use walker/cane & assistance as needed  Disposition SNF  Diet: Soft with feeding assistance and aspiration precautions.  Special Instructions: If you have smoked or chewed Tobacco  in the last 2 yrs please stop smoking, stop any regular Alcohol  and or any Recreational drug use.  On your next visit with your primary care physician please Get Medicines reviewed and adjusted.  Please request your Prim.MD to go over all Hospital Tests and Procedure/Radiological results at the follow up, please get all Hospital records sent to your Prim MD by signing hospital release before you go home.  If you experience worsening of your admission symptoms, develop shortness of breath, life threatening emergency, suicidal or homicidal thoughts you must seek medical attention immediately by calling 911 or calling your MD immediately  if symptoms less severe.  You Must read complete instructions/literature along with all the possible adverse reactions/side effects for all the Medicines you take and that have been prescribed to you. Take any new Medicines after you have completely understood and accpet all the possible adverse reactions/side effects.

## 2020-11-02 NOTE — Care Management Important Message (Signed)
Important Message  Patient Details  Name: KWAN SHELLHAMMER MRN: [id number] Date of Birth: [birth year]   Medicare Important Message Given:  Yes   Verbal consent obtained due to current National Emergency  Relationship to patient: Self Contact Name: Emma Schupp Call Date: 11/02/20  Time: 1458 Phone: [phone] Outcome: No Answer/Busy Important Message mailed to: Patient address on file   [name] 11/02/2020, 2:59 PM

## 2020-11-03 DIAGNOSIS — A419 Sepsis, unspecified organism: Secondary | ICD-10-CM | POA: Diagnosis not present

## 2020-11-03 DIAGNOSIS — K922 Gastrointestinal hemorrhage, unspecified: Secondary | ICD-10-CM | POA: Diagnosis not present

## 2020-11-03 DIAGNOSIS — U071 COVID-19: Secondary | ICD-10-CM | POA: Diagnosis not present

## 2020-11-03 DIAGNOSIS — I472 Ventricular tachycardia: Secondary | ICD-10-CM | POA: Diagnosis not present

## 2020-11-03 MED ORDER — POLYETHYLENE GLYCOL 3350 17 G PO PACK
17.0000 g | PACK | Freq: Two times a day (BID) | ORAL | Status: DC
  Administered 2020-11-03: 17 g via ORAL
  Filled 2020-11-03: qty 1

## 2020-11-03 MED ORDER — BISACODYL 10 MG RE SUPP
10.0000 mg | Freq: Every day | RECTAL | Status: DC
  Administered 2020-11-03: 10 mg via RECTAL
  Filled 2020-11-03: qty 1

## 2020-11-03 MED ORDER — MAGNESIUM HYDROXIDE 400 MG/5ML PO SUSP: 30.0000 mL | Freq: Two times a day (BID) | ORAL | Status: AC

## 2020-11-03 NOTE — Progress Notes (Signed)
PROGRESS NOTE                                                                                                                                                                                                             Patient Demographics:    [name] [name], is a 86 y.o. male, DOB - [birth year], VQX:[id number]  Outpatient Primary MD for the patient is Crist Infante, MD   Admit date - 10/23/2020   LOS - 29  Chief Complaint  Patient presents with  . Weakness       Brief Narrative: Patient is a 86 y.o. male with PMHx of CAD s/p CABG in 2002, s/p PPM implantation, chronic systolic heart failure, chronic atrial fibrillation-not on anticoagulation (history of recurrent GI bleeding), history of colon cancer, history of recurrent GI bleeding-who presented with a 3-4-day history of weakness, dark appearing stools.  In the emergency room-he was found to be febrile.  While in the ED-he had an episode of wide-complex tachycardia associated with hypotension-required emergent cardioversion.  He was also found to be COVID-19 positive and acute urinary retention.  He was subsequently admitted to the hospitalist service.  See below for further details.  COVID-19 vaccinated status: Vaccinated (but not a booster)  Significant Events: 11/26>> Admit to [facility] for generalized weakness, fever, and dark-colored stools-COVID-19 positive.  Had episode of wide-complex tachycardia with hypotension requiring emergent cardioversion. 11/26>> acute urinary retention-difficult Foley catheter -18 French coud catheter placed by urology. 12/3>> Foley removed-voiding trial in progress.  Significant studies: 11/26>>Chest x-ray: Streaky left basilar opacities, mild cardiomegaly and pulmonary vascular congestion. 11/27>> chest x-ray: Bilateral patchy opacities. 11/28>> Echo: EF 25-30%.  RV systolic function moderately reduced.  COVID-19 medications: Steroids:  11/26>>12/2 Remdesivir: 11/26>>11/30  Antibiotics: None  Microbiology data: 11/26 >>blood culture: No growth  Procedures: 11/26>> cardioversion by ED MD for wide-complex tachycardia (presumed V. tach) associated with hypotension.  Consults: Cardiology, GI, Urology  DVT prophylaxis:  SCDs Start: 10/23/20 1506    Note patient has been discharged we are awaiting SNF bed.     Subjective:   Patient in chair, appears comfortable, denies any headache, no fever, no chest pain or pressure, no shortness of breath , no abdominal pain. No focal weakness.  Complains of mild constipation.   Assessment  & Plan :   Acute Hypoxic Resp Failure due to Covid  19 Viral pneumonia: Improved-remains on room air-has completed a course of steroids and Remdesivir.  Prone/Incentive Spirometry: encouraged  incentive spirometry use 3-4/hour.  Sepsis: Likely secondary to COVID-19 infection-sepsis physiology has resolved. Cultures negative so far.  Wide-complex tachycardia: Reviewed ED/cardiology notes-concern for V. tach-cardioverted by EDMD on admission on 11/26.  Subsequently started on amiodarone infusion that has been since discontinued.  Currently stable on beta-blocker and digoxin.  Chronic atrial fibrillation with RVR [name] of greater than 3 has a Pacemaker : Rate better controlled today-amiodarone infusion discontinued on 11/30-rate controlled with beta-blocker/digoxin.  Not a candidate for anticoagulation due to issues with GI bleeding.  Cardiology following.    Chronic systolic heart failure EF 35%, severe MR : Euvolemic-does not require diuretics. Cards following, follow post DC - Dr [name].  GI bleeding: No overt GI bleeding for the past several days. Hemoglobin stable-GI signed off.  Continue PPI.  Acute urinary retention: S/p coud catheter insertion by urology on 11/26-Flomax on board, as per recommendation Foley was removed after the week on 10/30/2020 unfortunately has developed  retention again.  Urology replaced Foley on 10/31/2020.  Deconditioning: Secondary to acute illness-superimposed on some amount of chronic debility.  Plans are for either SNF versus home health on discharge.  Social worker following.  Constipation.  Placed on bowel regimen.    Delirium: Per patient's daughter-patient does get confused in the hospital.  He continues to have waxing and waning sundowning/delirium-this morning he is relatively awake and alert-and significantly less confused than yesterday.  No longer on steroids-they were discontinued on 12/2.  Continue to maintain delirium precautions-continue to expect some amount of delirium-as long as he remains hospitalized.    Goals of care: Discussed with patient earlier during the hospital stay when he was a little bit more coherent-and subsequently with patient's daughter multiple times-continue medical treatment-DNI in place-okay for CPR/defibrillation/ACLS medications.    RN pressure injury documentation: Pressure Injury 10/23/20 Coccyx (Active)  10/23/20 1600  Location: Coccyx  Location Orientation:   Staging:   Wound Description (Comments):   Present on Admission:      GI prophylaxis: PPI  Condition - Fair  Family Communication  : Daughter [name]  [phone]) on 10/31/20, 11/01/20, 11/03/20  Diet :  Diet Order            DIET SOFT Room service appropriate? No; Fluid consistency: Thin  Diet effective now                  Disposition Plan  :  Status is: Inpatient  Remains inpatient appropriate because:Inpatient level of care appropriate due to severity of illness   Dispo: The patient is from: Home              Anticipated d/c is to: TBD              Anticipated d/c date is: > 1 days              Patient currently is medically stable to d/c.  Barriers to discharge: Awaiting SNF bed  Antimicorbials  :    Anti-infectives (From admission, onward)   Start     Dose/Rate Route Frequency Ordered Stop   10/24/20 1000   remdesivir 100 mg in sodium chloride 0.9 % 100 mL IVPB  Status:  Discontinued       "Followed by" Linked Group Details   100 mg 200 mL/hr over 30 Minutes Intravenous Daily 10/23/20 1509 10/26/20 1111   10/23/20 1600  remdesivir  200 mg in sodium chloride 0.9% 250 mL IVPB       "Followed by" Linked Group Details   200 mg 580 mL/hr over 30 Minutes Intravenous Once 10/23/20 1509 10/23/20 1922   10/23/20 1215  cefTRIAXone (ROCEPHIN) 1 g in sodium chloride 0.9 % 100 mL IVPB        1 g 200 mL/hr over 30 Minutes Intravenous  Once 10/23/20 1206 10/23/20 1307   10/23/20 1215  azithromycin (ZITHROMAX) 500 mg in sodium chloride 0.9 % 250 mL IVPB        500 mg 250 mL/hr over 60 Minutes Intravenous  Once 10/23/20 1206 10/23/20 1341      Inpatient Medications  Scheduled Meds: . vitamin C  500 mg Oral Daily  . bisacodyl  10 mg Rectal Daily  . Chlorhexidine Gluconate Cloth  6 each Topical Daily  . cholecalciferol  1,000 Units Oral Daily  . digoxin  0.0625 mg Oral QODAY  . metoprolol tartrate  50 mg Oral BID  . multivitamin with minerals  1 tablet Oral Daily  . pantoprazole  40 mg Oral BID  . polyethylene glycol  17 g Oral BID  . sodium chloride flush  3 mL Intravenous Q12H  . tamsulosin  0.4 mg Oral Daily  . vitamin B-12  1,000 mcg Oral Daily  . zinc sulfate  220 mg Oral Daily   Continuous Infusions:  PRN Meds:.acetaminophen, albuterol, chlorpheniramine-HYDROcodone, diphenoxylate-atropine, guaiFENesin-dextromethorphan   Time Spent in minutes  25   See all Orders from today for further details  Lala Lund M.D on 11/03/2020 at 10:48 AM  To page go to [email or web address] - use universal password  [facility] -  Office  [phone]    Objective:   Vitals:   11/02/20 1940 11/02/20 2334 11/03/20 0405 11/03/20 0728  BP: (!) 104/52 (!) 111/56 118/63 121/61  Pulse: 90 84 91 75  Resp: 20 18 20 17   Temp: 98.3 F (36.8 C) 98.3 F (36.8 C) 98.5 F (36.9 C) 98.4 F (36.9 C)   TempSrc: Axillary Axillary Axillary Axillary  SpO2: 100% 97% 100% 100%  Weight:      Height:        Wt Readings from Last 3 Encounters:  10/23/20 62 kg  06/15/20 62.1 kg  01/08/20 63.5 kg     Intake/Output Summary (Last 24 hours) at 11/03/2020 1048 Last data filed at 11/03/2020 0935 Gross per 24 hour  Intake 220 ml  Output 700 ml  Net -480 ml     Physical Exam  Awake less confused, No new F.N deficits,  Foley in place [place].AT,PERRAL Supple Neck,No JVD, No cervical lymphadenopathy appriciated.  Symmetrical Chest wall movement, Good air movement bilaterally, CTAB RRR,No Gallops, Rubs or new Murmurs, No Parasternal Heave +ve B.Sounds, Abd Soft, No tenderness, No organomegaly appriciated, No rebound - guarding or rigidity. No Cyanosis, Clubbing or edema, No new Rash or bruise    Data Review:    CBC  Recent Labs  Lab 10/27/20 1818 10/28/20 0201 10/28/20 0425 10/29/20 0033 10/31/20 0329  WBC 9.5 7.5 7.7 10.3 7.7  HGB 9.2* 9.0* 8.7* 9.8* 9.0*  HCT 27.8* 27.6* 26.0* 30.0* 27.0*  PLT 155 133* 134* 181 181  MCV 94.2 95.5 93.5 95.8 93.1  MCH 31.2 31.1 31.3 31.3 31.0  MCHC 33.1 32.6 33.5 32.7 33.3  RDW 14.0 13.7 13.9 13.9 13.7  LYMPHSABS  --   --  0.3*  --   --   MONOABS  --   --  0.5  --   --  EOSABS  --   --  0.0  --   --   BASOSABS  --   --  0.0  --   --     Chemistries   Recent Labs  Lab 10/28/20 0425 10/29/20 0033 10/31/20 0329  NA 135 135 137  K 4.4 4.1 3.9  CL 102 101 101  CO2 22 25 25   GLUCOSE 83 114* 72  BUN 27* 27* 20  CREATININE 1.20 1.20 1.04  CALCIUM 8.6* 8.6* 8.5*  MG 2.2 2.3  --   AST 44*  --   --   ALT 30  --   --   ALKPHOS 28*  --   --   BILITOT 1.3*  --   --    ------------------------------------------------------------------------------------------------------------------ No results for input(s): CHOL, HDL, LDLCALC, TRIG, CHOLHDL, LDLDIRECT in the last 72 hours.  Lab Results  Component Value Date   HGBA1C  06/11/2007     5.7 (NOTE)   The ADA recommends the following therapeutic goals for glycemic   control related to Hgb A1C measurement:   Goal of Therapy:   < 7.0% Hgb A1C   Action Suggested:  > 8.0% Hgb A1C   Ref:  Diabetes Care, 22, Suppl. 1, 1999   ------------------------------------------------------------------------------------------------------------------ No results for input(s): TSH, T4TOTAL, T3FREE, THYROIDAB in the last 72 hours.  Invalid input(s): FREET3 ------------------------------------------------------------------------------------------------------------------ No results for input(s): VITAMINB12, FOLATE, FERRITIN, TIBC, IRON, RETICCTPCT in the last 72 hours.  Coagulation profile No results for input(s): INR, PROTIME in the last 168 hours.  No results for input(s): DDIMER in the last 72 hours.  Cardiac Enzymes No results for input(s): CKMB, TROPONINI, MYOGLOBIN in the last 168 hours.  Invalid input(s): CK ------------------------------------------------------------------------------------------------------------------    Component Value Date/Time   BNP 541.6 (H) 10/23/2020 1640   BNP 379.4 (H) 02/25/2016 1308    Micro Results No results found for this or any previous visit (from the past 240 hour(s)).  Radiology Reports DG Chest Port 1 View  Result Date: 10/23/2020 CLINICAL DATA:  Questionable sepsis. EXAM: PORTABLE CHEST 1 VIEW COMPARISON:  October 14, 2013. FINDINGS: Similar mildly enlarged cardiac silhouette with median sternotomy. Pulmonary vascular congestion. Left subclavian approach cardiac rhythm maintenance device. Aortic atherosclerosis. Streaky left basilar opacities. No confluent consolidation. No visible pleural effusions or pneumothorax. No acute osseous abnormality. IMPRESSION: 1. Low lung volumes with streaky left basilar opacities, which may represent atelectasis, aspiration, and/or pneumonia. No confluent consolidation. 2. Mild cardiomegaly and pulmonary vascular  congestion. Electronically Signed   By: Margaretha Sheffield MD   On: 10/23/2020 11:54   DG Chest Port 1V same Day  Result Date: 10/24/2020 CLINICAL DATA:  Reported COVID-19 positive EXAM: PORTABLE CHEST 1 VIEW COMPARISON:  October 23, 2020 FINDINGS: There is ill-defined opacity in the right base and left mid lung regions. Left midlung ill-defined opacity is partly obscured by pacemaker. There is mild left base atelectasis. No consolidation. Heart is upper normal in size with pulmonary vascularity normal. Pacemaker leads are attached to the right atrium and right ventricle. Patient is status post coronary artery bypass grafting. There is aortic atherosclerosis. No adenopathy no evident bone lesions. IMPRESSION: Areas of airspace opacity in the right base and left mid lung regions which potentially may represent atypical organism pneumonia, particularly given the clinical history. Mild left base atelectasis. No consolidation. Heart upper normal in size, stable. Pacemaker leads attached to right atrium and right ventricle. Status post coronary artery bypass grafting. Aortic Atherosclerosis (ICD10-I70.0). Electronically Signed   By: Gwyndolyn Saxon  Jasmine December III M.D.   On: 10/24/2020 09:53   ECHOCARDIOGRAM COMPLETE  Result Date: 10/25/2020    ECHOCARDIOGRAM REPORT   Patient Name:   TAEJON IRANI Date of Exam: 10/25/2020 Medical Rec #:  [id number]      Height:       69.0 in Accession #:    [id number]     Weight:       136.7 lb Date of Birth:  [birth year]      BSA:          1.757 m Patient Age:    24 years       BP:           101/83 mmHg Patient Gender: M              HR:           100 bpm. Exam Location:  Inpatient Procedure: 2D Echo, Cardiac Doppler and Color Doppler Indications:    I48.91* Unspeicified atrial fibrillation  History:        Patient has prior history of Echocardiogram examinations, most                 recent 01/04/2016. Prior CABG. Covid19 positive.  Sonographer:    Merrie Roof RDCS Referring Phys: [place]  1. Left ventricular ejection fraction, by estimation, is 25 to 30%. The left ventricle has severely decreased function. The left ventricle demonstrates global hypokinesis. Left ventricular diastolic function could not be evaluated. There is severe akinesis of the left ventricular, basal-mid inferior and inferolateral walls. Incoordinate (paced) septal motion.  2. Right ventricular systolic function is moderately reduced. The right ventricular size is mildly enlarged. There is severely elevated pulmonary artery systolic pressure. The estimated right ventricular systolic pressure is 24.0 mmHg.  3. Left atrial size was massively dilated.  4. Right atrial size was moderately dilated.  5. Mild mitral subvalvular calcification.  6. Mild mitral subvalvular thickening/fibrosis.  7. Tethering of the mitral valve posterior leaflet.  8. The mitral valve is abnormal. Severe mitral valve regurgitation.  9. The tricuspid valve is abnormal. Tricuspid valve regurgitation is moderate. 10. The aortic valve is tricuspid. Aortic valve regurgitation is mild. Mild aortic valve sclerosis is present, with no evidence of aortic valve stenosis. 11. Moderately dilated pulmonary artery. 12. The inferior vena cava is dilated in size with <50% respiratory variability, suggesting right atrial pressure of 15 mmHg. Comparison(s): Changes from prior study are noted. 01/04/2016: LVEF 40-45%, pacer wire, mild AI, moderate MR, moderate LAE, mild RAE. FINDINGS  Left Ventricle: Left ventricular ejection fraction, by estimation, is 25 to 30%. The left ventricle has severely decreased function. The left ventricle demonstrates global hypokinesis. Severe akinesis of the left ventricular, basal-mid inferior wall and  inferolateral wall. The left ventricular internal cavity size was normal in size. There is no left ventricular hypertrophy. Abnormal (paradoxical) septal motion, consistent with RV pacemaker. Left ventricular  diastolic function could not be evaluated due to atrial fibrillation. Left ventricular diastolic function could not be evaluated. Right Ventricle: The right ventricular size is mildly enlarged. No increase in right ventricular wall thickness. Right ventricular systolic function is moderately reduced. There is severely elevated pulmonary artery systolic pressure. The tricuspid regurgitant velocity is 3.87 m/s, and with an assumed right atrial pressure of 15 mmHg, the estimated right ventricular systolic pressure is 97.3 mmHg. Left Atrium: Left atrial size was massively dilated. Right Atrium: Right atrial size was moderately dilated. Pericardium: There is no evidence of  pericardial effusion. Mitral Valve: The mitral valve is abnormal. There is mild thickening of the mitral valve leaflet(s). Mild mitral subvalvular calcification. Mild mitral subvalvular thickening/fibrosis. Tethering involving the posterior leaflet is noted. Severe mitral valve regurgitation, with eccentric posteriorly directed jet. Tricuspid Valve: The tricuspid valve is abnormal. Tricuspid valve regurgitation is moderate. Aortic Valve: The aortic valve is tricuspid. Aortic valve regurgitation is mild. Mild aortic valve sclerosis is present, with no evidence of aortic valve stenosis. Pulmonic Valve: The pulmonic valve was grossly normal. Pulmonic valve regurgitation is mild. Aorta: The aortic root and ascending aorta are structurally normal, with no evidence of dilitation. Pulmonary Artery: The pulmonary artery is moderately dilated. Venous: The inferior vena cava is dilated in size with less than 50% respiratory variability, suggesting right atrial pressure of 15 mmHg. IAS/Shunts: No atrial level shunt detected by color flow Doppler. Additional Comments: A pacer wire is visualized.  LEFT VENTRICLE PLAX 2D LVIDd:         5.40 cm LVIDs:         4.80 cm LV PW:         0.90 cm LV IVS:        1.00 cm  RIGHT VENTRICLE         IVC TAPSE (M-mode): 1.8 cm   IVC diam: 2.00 cm LEFT ATRIUM              Index        RIGHT ATRIUM           Index LA diam:        4.60 cm  2.62 cm/m   RA Area:     23.50 cm LA Vol (A2C):   184.0 ml 104.70 ml/m RA Volume:   71.40 ml  40.63 ml/m LA Vol (A4C):   161.0 ml 91.61 ml/m LA Biplane Vol: 170.0 ml 96.73 ml/m  AORTIC VALVE LVOT Vmax:   73.10 cm/s LVOT Vmean:  44.100 cm/s LVOT VTI:    0.122 m  AORTA Ao Root diam: 3.60 cm Ao Asc diam:  3.40 cm MR Peak grad:    136.9 mmHg  TRICUSPID VALVE MR Mean grad:    60.0 mmHg   TR Peak grad:   59.9 mmHg MR Vmax:         585.00 cm/s TR Vmax:        387.00 cm/s MR Vmean:        349.0 cm/s MR PISA:         3.08 cm    SHUNTS MR PISA Eff ROA: 17 mm      Systemic VTI: 0.12 m MR PISA Radius:  0.70 cm Lyman Bishop MD Electronically signed by Lyman Bishop MD Signature Date/Time: 10/25/2020/4:41:18 PM    Final

## 2020-11-03 NOTE — Progress Notes (Signed)
Physical Therapy Treatment Patient Details Name: [name] [name] MRN: [id number] DOB: May 17, 1932 Today's Date: 11/03/2020    History of Present Illness 86 y.o. male with medical history significant of CAD s/p CABG in 2002, s/p PPM, ischemic cardiomyopathy, systolic CHF, HTN, HLD, PAF not on anticoagulation secondary to recurrent GI bleed, back surgery, and history of colon cancer presented 10/23/20 with complaints of weakness, fever, tarry stools x 3 days. +COVID pneumonia with sepsis; while in ED episode of wide-complex tachycardia with hypotension requiring emergent cardioversion.    PT Comments    Patient up in chair all morning per RN. Just finished lunch and agreeable to participate. Patient's cognition much improved (ox3), however continues with safety issues with use of RW (and not wanting to use RW for stand-pivot). Incr time for all mobility due to slow processing and requires repetition of cues due to [facility]. Walked twice with RW and min assist (longest distance 30 ft).     Follow Up Recommendations  Supervision/Assistance - 24 hour;SNF (per SW, daughter cannot provide level of care pt needs)     Equipment Recommendations  Wheelchair (measurements PT);Wheelchair cushion (measurements PT);3in1 (PT)    Recommendations for Other Services       Precautions / Restrictions Precautions Precautions: Fall Precaution Comments: reports fell recently    Mobility  Bed Mobility Overal bed mobility: Needs Assistance Bed Mobility: Sit to Supine       Sit to supine: Supervision   General bed mobility comments: HOB flat and no rail  Transfers Overall transfer level: Needs assistance Equipment used: Rolling walker (2 wheeled);None Transfers: Sit to/from [facility] Sit to Stand: Mod assist Stand pivot transfers: Min assist       General transfer comment: x 5 transfers (3 off low BSC) with RW mod assist due to posterior lean; from bed to [facility] without RW min assist    Ambulation/Gait Ambulation/Gait assistance: Min assist Gait Distance (Feet): 30 Feet (20) Assistive device: Rolling walker (2 wheeled) Gait Pattern/deviations: Step-to pattern;Trunk flexed;Decreased stride length;Shuffle Gait velocity: decr   General Gait Details: agreed to use of RW; max cues for upright posture and safe use of RW; incr time for all mobility   Stairs             Wheelchair Mobility    Modified Rankin (Stroke Patients Only)       Balance Overall balance assessment: Needs assistance Sitting-balance support: No upper extremity supported;Feet supported Sitting balance-Leahy Scale: Fair     Standing balance support: During functional activity;Bilateral upper extremity supported Standing balance-Leahy Scale: Poor                              Cognition Arousal/Alertness: Awake/alert Behavior During Therapy: WFL for tasks assessed/performed Overall Cognitive Status: No family/caregiver present to determine baseline cognitive functioning Area of Impairment: Orientation;Memory;Safety/judgement;Awareness;Attention;Following commands;Problem solving                 Orientation Level: Situation Current Attention Level: Focused;Sustained Memory: Decreased recall of precautions;Decreased short-term memory Following Commands: Follows multi-step commands with increased time;Follows one step commands with increased time Safety/Judgement: Decreased awareness of safety;Decreased awareness of deficits Awareness: Emergent Problem Solving: Difficulty sequencing;Requires verbal cues;Requires tactile cues General Comments: cognition improving however remains with decr awareness of safety      Exercises      General Comments        Pertinent Vitals/Pain Pain Assessment: No/denies pain    Home Living  Prior Function            PT Goals (current goals can now be found in the care plan section) Acute Rehab PT  Goals Patient Stated Goal: go home ASAP PT Goal Formulation: With patient Time For Goal Achievement: 11/07/20 Potential to Achieve Goals: Good Progress towards PT goals: Progressing toward goals    Frequency    Min 2X/week      PT Plan Discharge plan needs to be updated;Frequency needs to be updated    Co-evaluation              AM-PAC PT "6 Clicks" Mobility   Outcome Measure  Help needed turning from your back to your side while in a flat bed without using bedrails?: A Little Help needed moving from lying on your back to sitting on the side of a flat bed without using bedrails?: A Little Help needed moving to and from a bed to a chair (including a wheelchair)?: A Little Help needed standing up from a chair using your arms (e.g., wheelchair or bedside chair)?: A Little Help needed to walk in hospital room?: A Little Help needed climbing 3-5 steps with a railing? : Total 6 Click Score: 16    End of Session Equipment Utilized During Treatment: Gait belt Activity Tolerance: Patient tolerated treatment well Patient left: with call bell/phone within reach;in bed;with bed alarm set Nurse Communication: Mobility status PT Visit Diagnosis: Unsteadiness on feet (R26.81);Muscle weakness (generalized) (M62.81);History of falling (Z91.81)     Time: 0459-9774 PT Time Calculation (min) (ACUTE ONLY): 49 min  Charges:  $Gait Training: 8-22 mins $Therapeutic Activity: 23-37 mins                      Arby Barrette, PT Pager [phone]    Rexanne Mano 11/03/2020, 2:25 PM

## 2020-11-03 NOTE — TOC Progression Note (Addendum)
Transition of Care [facility]) - Progression Note    Patient Details  Name: [name] [name] MRN: [id number] Date of Birth: 1932/05/08  Transition of Care [facility]) CM/SW [place], LCSW Phone Number: 11/03/2020, 9:28 AM  Clinical Narrative:    [facility] reports they are willing to accept patient tomorrow. CSW requested they begin insurance approval (not managed by [facility]).  CSW spoke with patient's daughter. She requested a nurse call her while in his room so she can speak to him. CSW sent message to RN.  Expected Discharge Plan: Skilled Nursing Facility Barriers to Discharge: Ship broker, Continued Medical Work up, SNF Pending bed offer  Expected Discharge Plan and Services Expected Discharge Plan: [place] In-house Referral: Clinical Social Work   Post Acute Care Choice: [place] Living arrangements for the past 2 months: Apartment Expected Discharge Date: 11/02/20                                     Social Determinants of Health (SDOH) Interventions    Readmission Risk Interventions No flowsheet data found.

## 2020-11-04 DIAGNOSIS — E861 Hypovolemia: Secondary | ICD-10-CM | POA: Diagnosis not present

## 2020-11-04 DIAGNOSIS — D6489 Other specified anemias: Secondary | ICD-10-CM | POA: Diagnosis not present

## 2020-11-04 DIAGNOSIS — D649 Anemia, unspecified: Secondary | ICD-10-CM | POA: Diagnosis not present

## 2020-11-04 DIAGNOSIS — M6281 Muscle weakness (generalized): Secondary | ICD-10-CM | POA: Diagnosis not present

## 2020-11-04 DIAGNOSIS — I428 Other cardiomyopathies: Secondary | ICD-10-CM | POA: Diagnosis not present

## 2020-11-04 DIAGNOSIS — I480 Paroxysmal atrial fibrillation: Secondary | ICD-10-CM | POA: Diagnosis not present

## 2020-11-04 DIAGNOSIS — K922 Gastrointestinal hemorrhage, unspecified: Secondary | ICD-10-CM | POA: Diagnosis not present

## 2020-11-04 DIAGNOSIS — R41841 Cognitive communication deficit: Secondary | ICD-10-CM | POA: Diagnosis not present

## 2020-11-04 DIAGNOSIS — Z85038 Personal history of other malignant neoplasm of large intestine: Secondary | ICD-10-CM | POA: Diagnosis not present

## 2020-11-04 DIAGNOSIS — R6520 Severe sepsis without septic shock: Secondary | ICD-10-CM | POA: Diagnosis not present

## 2020-11-04 DIAGNOSIS — R262 Difficulty in walking, not elsewhere classified: Secondary | ICD-10-CM | POA: Diagnosis not present

## 2020-11-04 DIAGNOSIS — I2510 Atherosclerotic heart disease of native coronary artery without angina pectoris: Secondary | ICD-10-CM | POA: Diagnosis not present

## 2020-11-04 DIAGNOSIS — R0989 Other specified symptoms and signs involving the circulatory and respiratory systems: Secondary | ICD-10-CM | POA: Diagnosis not present

## 2020-11-04 DIAGNOSIS — M2550 Pain in unspecified joint: Secondary | ICD-10-CM | POA: Diagnosis not present

## 2020-11-04 DIAGNOSIS — A419 Sepsis, unspecified organism: Secondary | ICD-10-CM | POA: Diagnosis not present

## 2020-11-04 DIAGNOSIS — Z7401 Bed confinement status: Secondary | ICD-10-CM | POA: Diagnosis not present

## 2020-11-04 DIAGNOSIS — R2681 Unsteadiness on feet: Secondary | ICD-10-CM | POA: Diagnosis not present

## 2020-11-04 DIAGNOSIS — R2689 Other abnormalities of gait and mobility: Secondary | ICD-10-CM | POA: Diagnosis not present

## 2020-11-04 DIAGNOSIS — U071 COVID-19: Secondary | ICD-10-CM | POA: Diagnosis not present

## 2020-11-04 DIAGNOSIS — R338 Other retention of urine: Secondary | ICD-10-CM | POA: Diagnosis not present

## 2020-11-04 DIAGNOSIS — I4891 Unspecified atrial fibrillation: Secondary | ICD-10-CM | POA: Diagnosis not present

## 2020-11-04 DIAGNOSIS — I509 Heart failure, unspecified: Secondary | ICD-10-CM | POA: Diagnosis not present

## 2020-11-04 DIAGNOSIS — I5189 Other ill-defined heart diseases: Secondary | ICD-10-CM | POA: Diagnosis not present

## 2020-11-04 DIAGNOSIS — L8990 Pressure ulcer of unspecified site, unspecified stage: Secondary | ICD-10-CM | POA: Insufficient documentation

## 2020-11-04 DIAGNOSIS — I472 Ventricular tachycardia: Secondary | ICD-10-CM | POA: Diagnosis not present

## 2020-11-04 DIAGNOSIS — I9589 Other hypotension: Secondary | ICD-10-CM | POA: Diagnosis not present

## 2020-11-04 DIAGNOSIS — I10 Essential (primary) hypertension: Secondary | ICD-10-CM | POA: Diagnosis not present

## 2020-11-04 DIAGNOSIS — J189 Pneumonia, unspecified organism: Secondary | ICD-10-CM | POA: Diagnosis not present

## 2020-11-04 DIAGNOSIS — R1312 Dysphagia, oropharyngeal phase: Secondary | ICD-10-CM | POA: Diagnosis not present

## 2020-11-04 DIAGNOSIS — J1282 Pneumonia due to coronavirus disease 2019: Secondary | ICD-10-CM | POA: Diagnosis not present

## 2020-11-04 DIAGNOSIS — J9600 Acute respiratory failure, unspecified whether with hypoxia or hypercapnia: Secondary | ICD-10-CM | POA: Diagnosis not present

## 2020-11-04 DIAGNOSIS — R7989 Other specified abnormal findings of blood chemistry: Secondary | ICD-10-CM | POA: Diagnosis not present

## 2020-11-04 DIAGNOSIS — R5381 Other malaise: Secondary | ICD-10-CM | POA: Diagnosis not present

## 2020-11-04 DIAGNOSIS — N401 Enlarged prostate with lower urinary tract symptoms: Secondary | ICD-10-CM | POA: Diagnosis not present

## 2020-11-04 DIAGNOSIS — E8809 Other disorders of plasma-protein metabolism, not elsewhere classified: Secondary | ICD-10-CM | POA: Diagnosis not present

## 2020-11-04 NOTE — TOC Transition Note (Signed)
Transition of Care [facility]) - CM/SW Discharge Note   Patient Details  Name: [name] [name] MRN: [id number] Date of Birth: 1932-11-13  Transition of Care [facility]) CM/SW Contact:  Benard Halsted, LCSW Phone Number: 11/04/2020, 2:57 PM   Clinical Narrative:    Patient will DC to: [facility] Anticipated DC date: 11/04/20 Family notified: Daughter, Art therapist by: Corey Harold   Per MD patient ready for DC to [place] . RN to call report prior to discharge [phone] Room 805A). RN, patient, patient's family, and facility notified of DC. Discharge Summary and FL2 sent to facility. DC packet on chart. Ambulance transport requested for patient.   CSW will sign off for now as social work intervention is no longer needed. Please consult [place] again if new needs arise.      Final next level of care: Skilled Nursing Facility Barriers to Discharge: Barriers Resolved   Patient Goals and CMS Choice Patient states their goals for this hospitalization and ongoing recovery are:: Rehab CMS Medicare.gov Compare Post Acute Care list provided to:: Patient Represenative (must comment) Choice offered to / list presented to : Adult Children  Discharge Placement   Existing PASRR number confirmed : 11/04/20          Patient chooses bed at: [facility] Patient to be transferred to facility by: [place] Name of family member notified: [name], daughter Patient and family notified of of transfer: 11/04/20  Discharge Plan and Services In-house Referral: Clinical Social Work   Post Acute Care Choice: [place]                               Social Determinants of Health (SDOH) Interventions     Readmission Risk Interventions No flowsheet data found.

## 2020-11-04 NOTE — Progress Notes (Signed)
PROGRESS NOTE                                                                                                                                                                                                             Patient Demographics:    [name] [name], is a 86 y.o. male, DOB - 11-14-1932, ZYS:[id number]  Outpatient Primary MD for the patient is Crist Infante, MD   Admit date - 10/23/2020   LOS - 12  Chief Complaint  Patient presents with  . Weakness       Brief Narrative: Patient is a 86 y.o. male with PMHx of CAD s/p CABG in 2002, s/p PPM implantation, chronic systolic heart failure, chronic atrial fibrillation-not on anticoagulation (history of recurrent GI bleeding), history of colon cancer, history of recurrent GI bleeding-who presented with a 3-4-day history of weakness, dark appearing stools.  In the emergency room-he was found to be febrile.  While in the ED-he had an episode of wide-complex tachycardia associated with hypotension-required emergent cardioversion.  He was also found to be COVID-19 positive and acute urinary retention.  He was subsequently admitted to the hospitalist service.  See below for further details.  COVID-19 vaccinated status: Vaccinated (but not a booster)  Significant Events: 11/26>> Admit to [facility] for generalized weakness, fever, and dark-colored stools-COVID-19 positive.  Had episode of wide-complex tachycardia with hypotension requiring emergent cardioversion. 11/26>> acute urinary retention-difficult Foley catheter -18 French coud catheter placed by urology. 12/3>> Foley removed-voiding trial in progress.  Significant studies: 11/26>>Chest x-ray: Streaky left basilar opacities, mild cardiomegaly and pulmonary vascular congestion. 11/27>> chest x-ray: Bilateral patchy opacities. 11/28>> Echo: EF 25-30%.  RV systolic function moderately reduced.  COVID-19 medications: Steroids:  11/26>>12/2 Remdesivir: 11/26>>11/30  Antibiotics: None  Microbiology data: 11/26 >>blood culture: No growth  Procedures: 11/26>> cardioversion by ED MD for wide-complex tachycardia (presumed V. tach) associated with hypotension.  Consults: Cardiology, GI, Urology  DVT prophylaxis:  SCDs Start: 10/23/20 1506    Note patient has been discharged we are awaiting SNF bed.     Subjective:   Seen in chair denies any headache, no chest or abdominal pain, breathing much improved.  Had couple of bowel movements since yesterday.   Assessment  & Plan :   Acute Hypoxic Resp Failure due to Covid 19 Viral pneumonia: Improved-remains on room air-has completed a  course of steroids and Remdesivir.  Prone/Incentive Spirometry: encouraged  incentive spirometry use 3-4/hour.  Sepsis: Likely secondary to COVID-19 infection-sepsis physiology has resolved. Cultures negative so far.  Wide-complex tachycardia: Reviewed ED/cardiology notes-concern for V. tach-cardioverted by EDMD on admission on 11/26.  Subsequently started on amiodarone infusion that has been since discontinued.  Currently stable on beta-blocker and digoxin.  Chronic atrial fibrillation with RVR [name] of greater than 3 has a Pacemaker : Rate better controlled today-amiodarone infusion discontinued on 11/30-rate controlled with beta-blocker/digoxin.  Not a candidate for anticoagulation due to issues with GI bleeding.  Cardiology following.    Chronic systolic heart failure EF 35%, severe MR : Euvolemic-does not require diuretics. Cards following, follow post DC - Dr [name].  GI bleeding: No overt GI bleeding for the past several days. Hemoglobin stable-GI signed off.  Continue PPI.  Acute urinary retention: S/p coud catheter insertion by urology on 11/26-[place] on board, as per recommendation Foley was removed after the week on 10/30/2020 unfortunately has developed retention again.  Urology replaced Foley on  10/31/2020.  Deconditioning: Secondary to acute illness-superimposed on some amount of chronic debility.  Plans are for either SNF versus home health on discharge.  Social worker following.  Constipation.  Placed on bowel regimen, much improved now having bowel movements.    Delirium: Per patient's daughter-patient does get confused in the hospital.  He continues to have waxing and waning sundowning/delirium-this morning he is relatively awake and alert-and significantly less confused than yesterday.  No longer on steroids-they were discontinued on 12/2.  Continue to maintain delirium precautions-continue to expect some amount of delirium-as long as he remains hospitalized.    Goals of care: Discussed with patient earlier during the hospital stay when he was a little bit more coherent-and subsequently with patient's daughter multiple times-continue medical treatment-DNI in place-okay for CPR/defibrillation/ACLS medications.    RN pressure injury documentation: Pressure Injury 10/23/20 Coccyx Stage 2 -  Partial thickness loss of dermis presenting as a shallow open injury with a red, pink wound bed without slough. (Active)  10/23/20 1600  Location: Coccyx  Location Orientation:   Staging: Stage 2 -  Partial thickness loss of dermis presenting as a shallow open injury with a red, pink wound bed without slough.  Wound Description (Comments):   Present on Admission:      GI prophylaxis: PPI  Condition - Fair  Family Communication  : Daughter [name]  [phone]) on 10/31/20, 11/01/20, 11/03/20  Diet :  Diet Order            DIET SOFT Room service appropriate? No; Fluid consistency: Thin  Diet effective now                  Disposition Plan  :  Status is: Inpatient  Remains inpatient appropriate because:Inpatient level of care appropriate due to severity of illness   Dispo: The patient is from: Home              Anticipated d/c is to: TBD              Anticipated d/c date is: > 1  days              Patient currently is medically stable to d/c.  Barriers to discharge: Awaiting SNF bed  Antimicorbials  :    Anti-infectives (From admission, onward)   Start     Dose/Rate Route Frequency Ordered Stop   10/24/20 1000  remdesivir 100 mg in sodium  chloride 0.9 % 100 mL IVPB  Status:  Discontinued       "Followed by" Linked Group Details   100 mg 200 mL/hr over 30 Minutes Intravenous Daily 10/23/20 1509 10/26/20 1111   10/23/20 1600  remdesivir 200 mg in sodium chloride 0.9% 250 mL IVPB       "Followed by" Linked Group Details   200 mg 580 mL/hr over 30 Minutes Intravenous Once 10/23/20 1509 10/23/20 1922   10/23/20 1215  cefTRIAXone (ROCEPHIN) 1 g in sodium chloride 0.9 % 100 mL IVPB        1 g 200 mL/hr over 30 Minutes Intravenous  Once 10/23/20 1206 10/23/20 1307   10/23/20 1215  azithromycin (ZITHROMAX) 500 mg in sodium chloride 0.9 % 250 mL IVPB        500 mg 250 mL/hr over 60 Minutes Intravenous  Once 10/23/20 1206 10/23/20 1341      Inpatient Medications  Scheduled Meds: . vitamin C  500 mg Oral Daily  . bisacodyl  10 mg Rectal Daily  . Chlorhexidine Gluconate Cloth  6 each Topical Daily  . cholecalciferol  1,000 Units Oral Daily  . digoxin  0.0625 mg Oral QODAY  . magnesium hydroxide  30 mL Oral BID  . metoprolol tartrate  50 mg Oral BID  . multivitamin with minerals  1 tablet Oral Daily  . pantoprazole  40 mg Oral BID  . polyethylene glycol  17 g Oral BID  . sodium chloride flush  3 mL Intravenous Q12H  . tamsulosin  0.4 mg Oral Daily  . vitamin B-12  1,000 mcg Oral Daily  . zinc sulfate  220 mg Oral Daily   Continuous Infusions:  PRN Meds:.acetaminophen, albuterol, chlorpheniramine-HYDROcodone, diphenoxylate-atropine, guaiFENesin-dextromethorphan   Time Spent in minutes  25   See all Orders from today for further details  Lala Lund M.D on 11/04/2020 at 8:34 AM  To page go to [email or web address] - use universal password  [facility]  -  Office  [phone]    Objective:   Vitals:   11/03/20 1701 11/03/20 2054 11/04/20 0512 11/04/20 0754  BP: (!) 102/54 (!) 107/58 (!) 109/55 (!) 102/52  Pulse:  84 85 81  Resp:  18 16   Temp:  98.2 F (36.8 C) 98 F (36.7 C)   TempSrc:  Oral Oral   SpO2:  99% 99%   Weight:      Height:        Wt Readings from Last 3 Encounters:  10/23/20 62 kg  06/15/20 62.1 kg  01/08/20 63.5 kg     Intake/Output Summary (Last 24 hours) at 11/04/2020 0834 Last data filed at 11/04/2020 0514 Gross per 24 hour  Intake 180 ml  Output 400 ml  Net -220 ml     Physical Exam  Awake less confused, No new F.N deficits,  Foley in place [place].AT,PERRAL Supple Neck,No JVD, No cervical lymphadenopathy appriciated.  Symmetrical Chest wall movement, Good air movement bilaterally, CTAB RRR,No Gallops, Rubs or new Murmurs, No Parasternal Heave +ve B.Sounds, Abd Soft, No tenderness, No organomegaly appriciated, No rebound - guarding or rigidity. No Cyanosis, Clubbing or edema, No new Rash or bruise     Data Review:    CBC  Recent Labs  Lab 10/29/20 0033 10/31/20 0329  WBC 10.3 7.7  HGB 9.8* 9.0*  HCT 30.0* 27.0*  PLT 181 181  MCV 95.8 93.1  MCH 31.3 31.0  MCHC 32.7 33.3  RDW 13.9 13.7    Chemistries  Recent Labs  Lab 10/29/20 0033 10/31/20 0329  NA 135 137  K 4.1 3.9  CL 101 101  CO2 25 25  GLUCOSE 114* 72  BUN 27* 20  CREATININE 1.20 1.04  CALCIUM 8.6* 8.5*  MG 2.3  --    ------------------------------------------------------------------------------------------------------------------ No results for input(s): CHOL, HDL, LDLCALC, TRIG, CHOLHDL, LDLDIRECT in the last 72 hours.  Lab Results  Component Value Date   HGBA1C  06/11/2007    5.7 (NOTE)   The ADA recommends the following therapeutic goals for glycemic   control related to Hgb A1C measurement:   Goal of Therapy:   < 7.0% Hgb A1C   Action Suggested:  > 8.0% Hgb A1C   Ref:  Diabetes Care, 22, Suppl. 1, 1999    ------------------------------------------------------------------------------------------------------------------ No results for input(s): TSH, T4TOTAL, T3FREE, THYROIDAB in the last 72 hours.  Invalid input(s): FREET3 ------------------------------------------------------------------------------------------------------------------ No results for input(s): VITAMINB12, FOLATE, FERRITIN, TIBC, IRON, RETICCTPCT in the last 72 hours.  Coagulation profile No results for input(s): INR, PROTIME in the last 168 hours.  No results for input(s): DDIMER in the last 72 hours.  Cardiac Enzymes No results for input(s): CKMB, TROPONINI, MYOGLOBIN in the last 168 hours.  Invalid input(s): CK ------------------------------------------------------------------------------------------------------------------    Component Value Date/Time   BNP 541.6 (H) 10/23/2020 1640   BNP 379.4 (H) 02/25/2016 5956    Micro Results No results found for this or any previous visit (from the past 240 hour(s)).  Radiology Reports DG Chest Port 1 View  Result Date: 10/23/2020 CLINICAL DATA:  Questionable sepsis. EXAM: PORTABLE CHEST 1 VIEW COMPARISON:  October 14, 2013. FINDINGS: Similar mildly enlarged cardiac silhouette with median sternotomy. Pulmonary vascular congestion. Left subclavian approach cardiac rhythm maintenance device. Aortic atherosclerosis. Streaky left basilar opacities. No confluent consolidation. No visible pleural effusions or pneumothorax. No acute osseous abnormality. IMPRESSION: 1. Low lung volumes with streaky left basilar opacities, which may represent atelectasis, aspiration, and/or pneumonia. No confluent consolidation. 2. Mild cardiomegaly and pulmonary vascular congestion. Electronically Signed   By: Margaretha Sheffield MD   On: 10/23/2020 11:54   DG Chest Port 1V same Day  Result Date: 10/24/2020 CLINICAL DATA:  Reported COVID-19 positive EXAM: PORTABLE CHEST 1 VIEW COMPARISON:   October 23, 2020 FINDINGS: There is ill-defined opacity in the right base and left mid lung regions. Left midlung ill-defined opacity is partly obscured by pacemaker. There is mild left base atelectasis. No consolidation. Heart is upper normal in size with pulmonary vascularity normal. Pacemaker leads are attached to the right atrium and right ventricle. Patient is status post coronary artery bypass grafting. There is aortic atherosclerosis. No adenopathy no evident bone lesions. IMPRESSION: Areas of airspace opacity in the right base and left mid lung regions which potentially may represent atypical organism pneumonia, particularly given the clinical history. Mild left base atelectasis. No consolidation. Heart upper normal in size, stable. Pacemaker leads attached to right atrium and right ventricle. Status post coronary artery bypass grafting. Aortic Atherosclerosis (ICD10-I70.0). Electronically Signed   By: Lowella Grip III M.D.   On: 10/24/2020 09:53   ECHOCARDIOGRAM COMPLETE  Result Date: 10/25/2020    ECHOCARDIOGRAM REPORT   Patient Name:   MOO GRAVLEY Date of Exam: 10/25/2020 Medical Rec #:  [id number]      Height:       69.0 in Accession #:    [id number]     Weight:       136.7 lb Date of Birth:  11/13/1932  BSA:          1.757 m Patient Age:    38 years       BP:           101/83 mmHg Patient Gender: M              HR:           100 bpm. Exam Location:  Inpatient Procedure: 2D Echo, Cardiac Doppler and Color Doppler Indications:    I48.91* Unspeicified atrial fibrillation  History:        Patient has prior history of Echocardiogram examinations, most                 recent 01/04/2016. Prior CABG. Covid19 positive.  Sonographer:    Merrie Roof RDCS Referring Phys: [place]  1. Left ventricular ejection fraction, by estimation, is 25 to 30%. The left ventricle has severely decreased function. The left ventricle demonstrates global hypokinesis. Left ventricular  diastolic function could not be evaluated. There is severe akinesis of the left ventricular, basal-mid inferior and inferolateral walls. Incoordinate (paced) septal motion.  2. Right ventricular systolic function is moderately reduced. The right ventricular size is mildly enlarged. There is severely elevated pulmonary artery systolic pressure. The estimated right ventricular systolic pressure is 94.7 mmHg.  3. Left atrial size was massively dilated.  4. Right atrial size was moderately dilated.  5. Mild mitral subvalvular calcification.  6. Mild mitral subvalvular thickening/fibrosis.  7. Tethering of the mitral valve posterior leaflet.  8. The mitral valve is abnormal. Severe mitral valve regurgitation.  9. The tricuspid valve is abnormal. Tricuspid valve regurgitation is moderate. 10. The aortic valve is tricuspid. Aortic valve regurgitation is mild. Mild aortic valve sclerosis is present, with no evidence of aortic valve stenosis. 11. Moderately dilated pulmonary artery. 12. The inferior vena cava is dilated in size with <50% respiratory variability, suggesting right atrial pressure of 15 mmHg. Comparison(s): Changes from prior study are noted. 01/04/2016: LVEF 40-45%, pacer wire, mild AI, moderate MR, moderate LAE, mild RAE. FINDINGS  Left Ventricle: Left ventricular ejection fraction, by estimation, is 25 to 30%. The left ventricle has severely decreased function. The left ventricle demonstrates global hypokinesis. Severe akinesis of the left ventricular, basal-mid inferior wall and  inferolateral wall. The left ventricular internal cavity size was normal in size. There is no left ventricular hypertrophy. Abnormal (paradoxical) septal motion, consistent with RV pacemaker. Left ventricular diastolic function could not be evaluated due to atrial fibrillation. Left ventricular diastolic function could not be evaluated. Right Ventricle: The right ventricular size is mildly enlarged. No increase in right ventricular  wall thickness. Right ventricular systolic function is moderately reduced. There is severely elevated pulmonary artery systolic pressure. The tricuspid regurgitant velocity is 3.87 m/s, and with an assumed right atrial pressure of 15 mmHg, the estimated right ventricular systolic pressure is 09.6 mmHg. Left Atrium: Left atrial size was massively dilated. Right Atrium: Right atrial size was moderately dilated. Pericardium: There is no evidence of pericardial effusion. Mitral Valve: The mitral valve is abnormal. There is mild thickening of the mitral valve leaflet(s). Mild mitral subvalvular calcification. Mild mitral subvalvular thickening/fibrosis. Tethering involving the posterior leaflet is noted. Severe mitral valve regurgitation, with eccentric posteriorly directed jet. Tricuspid Valve: The tricuspid valve is abnormal. Tricuspid valve regurgitation is moderate. Aortic Valve: The aortic valve is tricuspid. Aortic valve regurgitation is mild. Mild aortic valve sclerosis is present, with no evidence of aortic valve stenosis. Pulmonic Valve: The  pulmonic valve was grossly normal. Pulmonic valve regurgitation is mild. Aorta: The aortic root and ascending aorta are structurally normal, with no evidence of dilitation. Pulmonary Artery: The pulmonary artery is moderately dilated. Venous: The inferior vena cava is dilated in size with less than 50% respiratory variability, suggesting right atrial pressure of 15 mmHg. IAS/Shunts: No atrial level shunt detected by color flow Doppler. Additional Comments: A pacer wire is visualized.  LEFT VENTRICLE PLAX 2D LVIDd:         5.40 cm LVIDs:         4.80 cm LV PW:         0.90 cm LV IVS:        1.00 cm  RIGHT VENTRICLE         IVC TAPSE (M-mode): 1.8 cm  IVC diam: 2.00 cm LEFT ATRIUM              Index        RIGHT ATRIUM           Index LA diam:        4.60 cm  2.62 cm/m   RA Area:     23.50 cm LA Vol (A2C):   184.0 ml 104.70 ml/m RA Volume:   71.40 ml  40.63 ml/m LA Vol  (A4C):   161.0 ml 91.61 ml/m LA Biplane Vol: 170.0 ml 96.73 ml/m  AORTIC VALVE LVOT Vmax:   73.10 cm/s LVOT Vmean:  44.100 cm/s LVOT VTI:    0.122 m  AORTA Ao Root diam: 3.60 cm Ao Asc diam:  3.40 cm MR Peak grad:    136.9 mmHg  TRICUSPID VALVE MR Mean grad:    60.0 mmHg   TR Peak grad:   59.9 mmHg MR Vmax:         585.00 cm/s TR Vmax:        387.00 cm/s MR Vmean:        349.0 cm/s MR PISA:         3.08 cm    SHUNTS MR PISA Eff ROA: 17 mm      Systemic VTI: 0.12 m MR PISA Radius:  0.70 cm Lyman Bishop MD Electronically signed by Lyman Bishop MD Signature Date/Time: 10/25/2020/4:41:18 PM    Final

## 2020-11-04 NOTE — Progress Notes (Signed)
Report called to [place] at [facility]. All questions answered.

## 2020-11-04 NOTE — Progress Notes (Signed)
Upon further assessment, patient's charted "pressure injury" was moisture associated breakdown. Will update chart to reflect.

## 2020-11-04 NOTE — Progress Notes (Signed)
Attempted to call report to [facility]. Nurse unable to take report at this time. Will attempt later.

## 2020-11-05 DIAGNOSIS — I480 Paroxysmal atrial fibrillation: Secondary | ICD-10-CM | POA: Diagnosis not present

## 2020-11-05 DIAGNOSIS — M6281 Muscle weakness (generalized): Secondary | ICD-10-CM | POA: Diagnosis not present

## 2020-11-05 DIAGNOSIS — U071 COVID-19: Secondary | ICD-10-CM | POA: Diagnosis not present

## 2020-11-05 DIAGNOSIS — D649 Anemia, unspecified: Secondary | ICD-10-CM | POA: Diagnosis not present

## 2020-11-05 DIAGNOSIS — Z85038 Personal history of other malignant neoplasm of large intestine: Secondary | ICD-10-CM | POA: Diagnosis not present

## 2020-11-05 DIAGNOSIS — I472 Ventricular tachycardia: Secondary | ICD-10-CM | POA: Diagnosis not present

## 2020-11-05 DIAGNOSIS — J1282 Pneumonia due to coronavirus disease 2019: Secondary | ICD-10-CM | POA: Diagnosis not present

## 2020-11-05 DIAGNOSIS — I2510 Atherosclerotic heart disease of native coronary artery without angina pectoris: Secondary | ICD-10-CM | POA: Diagnosis not present

## 2020-11-05 DIAGNOSIS — I509 Heart failure, unspecified: Secondary | ICD-10-CM | POA: Diagnosis not present

## 2020-11-05 DIAGNOSIS — I4891 Unspecified atrial fibrillation: Secondary | ICD-10-CM | POA: Diagnosis not present

## 2020-11-05 DIAGNOSIS — R2681 Unsteadiness on feet: Secondary | ICD-10-CM | POA: Diagnosis not present

## 2020-11-05 DIAGNOSIS — N401 Enlarged prostate with lower urinary tract symptoms: Secondary | ICD-10-CM | POA: Diagnosis not present

## 2020-11-06 DIAGNOSIS — U071 COVID-19: Secondary | ICD-10-CM | POA: Diagnosis not present

## 2020-11-06 DIAGNOSIS — I509 Heart failure, unspecified: Secondary | ICD-10-CM | POA: Diagnosis not present

## 2020-11-06 DIAGNOSIS — R2681 Unsteadiness on feet: Secondary | ICD-10-CM | POA: Diagnosis not present

## 2020-11-06 DIAGNOSIS — I4891 Unspecified atrial fibrillation: Secondary | ICD-10-CM | POA: Diagnosis not present

## 2020-11-06 DIAGNOSIS — N401 Enlarged prostate with lower urinary tract symptoms: Secondary | ICD-10-CM | POA: Diagnosis not present

## 2020-11-06 DIAGNOSIS — Z85038 Personal history of other malignant neoplasm of large intestine: Secondary | ICD-10-CM | POA: Diagnosis not present

## 2020-11-06 DIAGNOSIS — M6281 Muscle weakness (generalized): Secondary | ICD-10-CM | POA: Diagnosis not present

## 2020-11-06 DIAGNOSIS — I2510 Atherosclerotic heart disease of native coronary artery without angina pectoris: Secondary | ICD-10-CM | POA: Diagnosis not present

## 2020-11-06 DIAGNOSIS — D649 Anemia, unspecified: Secondary | ICD-10-CM | POA: Diagnosis not present

## 2020-11-09 DIAGNOSIS — R0989 Other specified symptoms and signs involving the circulatory and respiratory systems: Secondary | ICD-10-CM | POA: Diagnosis not present

## 2020-11-09 DIAGNOSIS — U071 COVID-19: Secondary | ICD-10-CM | POA: Diagnosis not present

## 2020-11-09 DIAGNOSIS — R2681 Unsteadiness on feet: Secondary | ICD-10-CM | POA: Diagnosis not present

## 2020-11-09 DIAGNOSIS — Z85038 Personal history of other malignant neoplasm of large intestine: Secondary | ICD-10-CM | POA: Diagnosis not present

## 2020-11-09 DIAGNOSIS — M6281 Muscle weakness (generalized): Secondary | ICD-10-CM | POA: Diagnosis not present

## 2020-11-09 DIAGNOSIS — I10 Essential (primary) hypertension: Secondary | ICD-10-CM | POA: Diagnosis not present

## 2020-11-09 DIAGNOSIS — I4891 Unspecified atrial fibrillation: Secondary | ICD-10-CM | POA: Diagnosis not present

## 2020-11-09 DIAGNOSIS — I509 Heart failure, unspecified: Secondary | ICD-10-CM | POA: Diagnosis not present

## 2020-11-09 DIAGNOSIS — J1282 Pneumonia due to coronavirus disease 2019: Secondary | ICD-10-CM | POA: Diagnosis not present

## 2020-11-09 DIAGNOSIS — D649 Anemia, unspecified: Secondary | ICD-10-CM | POA: Diagnosis not present

## 2020-11-09 DIAGNOSIS — I2510 Atherosclerotic heart disease of native coronary artery without angina pectoris: Secondary | ICD-10-CM | POA: Diagnosis not present

## 2020-11-09 DIAGNOSIS — N401 Enlarged prostate with lower urinary tract symptoms: Secondary | ICD-10-CM | POA: Diagnosis not present

## 2020-11-11 DIAGNOSIS — I480 Paroxysmal atrial fibrillation: Secondary | ICD-10-CM | POA: Diagnosis not present

## 2020-11-11 DIAGNOSIS — R7989 Other specified abnormal findings of blood chemistry: Secondary | ICD-10-CM | POA: Diagnosis not present

## 2020-11-11 DIAGNOSIS — I9589 Other hypotension: Secondary | ICD-10-CM | POA: Diagnosis not present

## 2020-11-11 DIAGNOSIS — U071 COVID-19: Secondary | ICD-10-CM | POA: Diagnosis not present

## 2020-11-11 DIAGNOSIS — I2510 Atherosclerotic heart disease of native coronary artery without angina pectoris: Secondary | ICD-10-CM | POA: Diagnosis not present

## 2020-11-11 DIAGNOSIS — Z85038 Personal history of other malignant neoplasm of large intestine: Secondary | ICD-10-CM | POA: Diagnosis not present

## 2020-11-11 DIAGNOSIS — I509 Heart failure, unspecified: Secondary | ICD-10-CM | POA: Diagnosis not present

## 2020-11-11 DIAGNOSIS — M6281 Muscle weakness (generalized): Secondary | ICD-10-CM | POA: Diagnosis not present

## 2020-11-11 DIAGNOSIS — I472 Ventricular tachycardia: Secondary | ICD-10-CM | POA: Diagnosis not present

## 2020-11-11 DIAGNOSIS — N401 Enlarged prostate with lower urinary tract symptoms: Secondary | ICD-10-CM | POA: Diagnosis not present

## 2020-11-11 DIAGNOSIS — R2681 Unsteadiness on feet: Secondary | ICD-10-CM | POA: Diagnosis not present

## 2020-11-11 DIAGNOSIS — I5189 Other ill-defined heart diseases: Secondary | ICD-10-CM | POA: Diagnosis not present

## 2020-11-11 DIAGNOSIS — I4891 Unspecified atrial fibrillation: Secondary | ICD-10-CM | POA: Diagnosis not present

## 2020-11-11 DIAGNOSIS — D649 Anemia, unspecified: Secondary | ICD-10-CM | POA: Diagnosis not present

## 2020-11-13 DIAGNOSIS — I2510 Atherosclerotic heart disease of native coronary artery without angina pectoris: Secondary | ICD-10-CM | POA: Diagnosis not present

## 2020-11-13 DIAGNOSIS — D649 Anemia, unspecified: Secondary | ICD-10-CM | POA: Diagnosis not present

## 2020-11-13 DIAGNOSIS — R2681 Unsteadiness on feet: Secondary | ICD-10-CM | POA: Diagnosis not present

## 2020-11-13 DIAGNOSIS — N401 Enlarged prostate with lower urinary tract symptoms: Secondary | ICD-10-CM | POA: Diagnosis not present

## 2020-11-13 DIAGNOSIS — U071 COVID-19: Secondary | ICD-10-CM | POA: Diagnosis not present

## 2020-11-13 DIAGNOSIS — M6281 Muscle weakness (generalized): Secondary | ICD-10-CM | POA: Diagnosis not present

## 2020-11-13 DIAGNOSIS — Z85038 Personal history of other malignant neoplasm of large intestine: Secondary | ICD-10-CM | POA: Diagnosis not present

## 2020-11-13 DIAGNOSIS — I509 Heart failure, unspecified: Secondary | ICD-10-CM | POA: Diagnosis not present

## 2020-11-13 DIAGNOSIS — I4891 Unspecified atrial fibrillation: Secondary | ICD-10-CM | POA: Diagnosis not present

## 2020-11-15 DIAGNOSIS — I4891 Unspecified atrial fibrillation: Secondary | ICD-10-CM | POA: Diagnosis not present

## 2020-11-15 DIAGNOSIS — E8809 Other disorders of plasma-protein metabolism, not elsewhere classified: Secondary | ICD-10-CM | POA: Diagnosis not present

## 2020-11-15 DIAGNOSIS — R41841 Cognitive communication deficit: Secondary | ICD-10-CM | POA: Diagnosis not present

## 2020-11-15 DIAGNOSIS — I428 Other cardiomyopathies: Secondary | ICD-10-CM | POA: Diagnosis not present

## 2020-11-15 DIAGNOSIS — R262 Difficulty in walking, not elsewhere classified: Secondary | ICD-10-CM | POA: Diagnosis not present

## 2020-11-15 DIAGNOSIS — R1312 Dysphagia, oropharyngeal phase: Secondary | ICD-10-CM | POA: Diagnosis not present

## 2020-11-15 DIAGNOSIS — U071 COVID-19: Secondary | ICD-10-CM | POA: Diagnosis not present

## 2020-11-15 DIAGNOSIS — I5189 Other ill-defined heart diseases: Secondary | ICD-10-CM | POA: Diagnosis not present

## 2020-11-15 DIAGNOSIS — D649 Anemia, unspecified: Secondary | ICD-10-CM | POA: Diagnosis not present

## 2020-11-15 DIAGNOSIS — I9589 Other hypotension: Secondary | ICD-10-CM | POA: Diagnosis not present

## 2020-11-15 DIAGNOSIS — D6489 Other specified anemias: Secondary | ICD-10-CM | POA: Diagnosis not present

## 2020-11-15 DIAGNOSIS — N401 Enlarged prostate with lower urinary tract symptoms: Secondary | ICD-10-CM | POA: Diagnosis not present

## 2020-11-15 DIAGNOSIS — E861 Hypovolemia: Secondary | ICD-10-CM | POA: Diagnosis not present

## 2020-11-15 DIAGNOSIS — R2689 Other abnormalities of gait and mobility: Secondary | ICD-10-CM | POA: Diagnosis not present

## 2020-11-15 DIAGNOSIS — Z85038 Personal history of other malignant neoplasm of large intestine: Secondary | ICD-10-CM | POA: Diagnosis not present

## 2020-11-15 DIAGNOSIS — R338 Other retention of urine: Secondary | ICD-10-CM | POA: Diagnosis not present

## 2020-11-15 DIAGNOSIS — R5381 Other malaise: Secondary | ICD-10-CM | POA: Diagnosis not present

## 2020-11-15 DIAGNOSIS — I480 Paroxysmal atrial fibrillation: Secondary | ICD-10-CM | POA: Diagnosis not present

## 2020-11-15 DIAGNOSIS — I2510 Atherosclerotic heart disease of native coronary artery without angina pectoris: Secondary | ICD-10-CM | POA: Diagnosis not present

## 2020-11-15 DIAGNOSIS — I472 Ventricular tachycardia: Secondary | ICD-10-CM | POA: Diagnosis not present

## 2020-11-15 DIAGNOSIS — I509 Heart failure, unspecified: Secondary | ICD-10-CM | POA: Diagnosis not present

## 2020-11-15 DIAGNOSIS — R0989 Other specified symptoms and signs involving the circulatory and respiratory systems: Secondary | ICD-10-CM | POA: Diagnosis not present

## 2020-11-15 DIAGNOSIS — M6281 Muscle weakness (generalized): Secondary | ICD-10-CM | POA: Diagnosis not present

## 2020-11-15 DIAGNOSIS — J1282 Pneumonia due to coronavirus disease 2019: Secondary | ICD-10-CM | POA: Diagnosis not present

## 2020-11-15 DIAGNOSIS — R2681 Unsteadiness on feet: Secondary | ICD-10-CM | POA: Diagnosis not present

## 2020-11-16 DIAGNOSIS — I509 Heart failure, unspecified: Secondary | ICD-10-CM | POA: Diagnosis not present

## 2020-11-16 DIAGNOSIS — I9589 Other hypotension: Secondary | ICD-10-CM | POA: Diagnosis not present

## 2020-11-16 DIAGNOSIS — Z85038 Personal history of other malignant neoplasm of large intestine: Secondary | ICD-10-CM | POA: Diagnosis not present

## 2020-11-16 DIAGNOSIS — D6489 Other specified anemias: Secondary | ICD-10-CM | POA: Diagnosis not present

## 2020-11-16 DIAGNOSIS — E861 Hypovolemia: Secondary | ICD-10-CM | POA: Diagnosis not present

## 2020-11-16 DIAGNOSIS — I4891 Unspecified atrial fibrillation: Secondary | ICD-10-CM | POA: Diagnosis not present

## 2020-11-16 DIAGNOSIS — I5189 Other ill-defined heart diseases: Secondary | ICD-10-CM | POA: Diagnosis not present

## 2020-11-16 DIAGNOSIS — M6281 Muscle weakness (generalized): Secondary | ICD-10-CM | POA: Diagnosis not present

## 2020-11-16 DIAGNOSIS — N401 Enlarged prostate with lower urinary tract symptoms: Secondary | ICD-10-CM | POA: Diagnosis not present

## 2020-11-16 DIAGNOSIS — D649 Anemia, unspecified: Secondary | ICD-10-CM | POA: Diagnosis not present

## 2020-11-16 DIAGNOSIS — R2681 Unsteadiness on feet: Secondary | ICD-10-CM | POA: Diagnosis not present

## 2020-11-16 DIAGNOSIS — I2510 Atherosclerotic heart disease of native coronary artery without angina pectoris: Secondary | ICD-10-CM | POA: Diagnosis not present

## 2020-11-16 DIAGNOSIS — U071 COVID-19: Secondary | ICD-10-CM | POA: Diagnosis not present

## 2020-11-16 DIAGNOSIS — I428 Other cardiomyopathies: Secondary | ICD-10-CM | POA: Diagnosis not present

## 2020-11-17 DIAGNOSIS — I5189 Other ill-defined heart diseases: Secondary | ICD-10-CM | POA: Diagnosis not present

## 2020-11-17 DIAGNOSIS — R338 Other retention of urine: Secondary | ICD-10-CM | POA: Diagnosis not present

## 2020-11-17 DIAGNOSIS — I9589 Other hypotension: Secondary | ICD-10-CM | POA: Diagnosis not present

## 2020-11-17 DIAGNOSIS — J1282 Pneumonia due to coronavirus disease 2019: Secondary | ICD-10-CM | POA: Diagnosis not present

## 2020-11-18 DIAGNOSIS — Z85038 Personal history of other malignant neoplasm of large intestine: Secondary | ICD-10-CM | POA: Diagnosis not present

## 2020-11-18 DIAGNOSIS — I2510 Atherosclerotic heart disease of native coronary artery without angina pectoris: Secondary | ICD-10-CM | POA: Diagnosis not present

## 2020-11-18 DIAGNOSIS — I5189 Other ill-defined heart diseases: Secondary | ICD-10-CM | POA: Diagnosis not present

## 2020-11-18 DIAGNOSIS — U071 COVID-19: Secondary | ICD-10-CM | POA: Diagnosis not present

## 2020-11-18 DIAGNOSIS — N401 Enlarged prostate with lower urinary tract symptoms: Secondary | ICD-10-CM | POA: Diagnosis not present

## 2020-11-18 DIAGNOSIS — E861 Hypovolemia: Secondary | ICD-10-CM | POA: Diagnosis not present

## 2020-11-18 DIAGNOSIS — I509 Heart failure, unspecified: Secondary | ICD-10-CM | POA: Diagnosis not present

## 2020-11-18 DIAGNOSIS — I4891 Unspecified atrial fibrillation: Secondary | ICD-10-CM | POA: Diagnosis not present

## 2020-11-18 DIAGNOSIS — M6281 Muscle weakness (generalized): Secondary | ICD-10-CM | POA: Diagnosis not present

## 2020-11-18 DIAGNOSIS — I480 Paroxysmal atrial fibrillation: Secondary | ICD-10-CM | POA: Diagnosis not present

## 2020-11-18 DIAGNOSIS — I9589 Other hypotension: Secondary | ICD-10-CM | POA: Diagnosis not present

## 2020-11-18 DIAGNOSIS — D649 Anemia, unspecified: Secondary | ICD-10-CM | POA: Diagnosis not present

## 2020-11-18 DIAGNOSIS — R2681 Unsteadiness on feet: Secondary | ICD-10-CM | POA: Diagnosis not present

## 2020-11-20 DIAGNOSIS — E8809 Other disorders of plasma-protein metabolism, not elsewhere classified: Secondary | ICD-10-CM | POA: Diagnosis not present

## 2020-11-23 ENCOUNTER — Encounter: Payer: Medicare HMO | Admitting: Physician Assistant

## 2020-11-23 DIAGNOSIS — I9589 Other hypotension: Secondary | ICD-10-CM | POA: Diagnosis not present

## 2020-11-23 DIAGNOSIS — D649 Anemia, unspecified: Secondary | ICD-10-CM | POA: Diagnosis not present

## 2020-11-23 DIAGNOSIS — R2681 Unsteadiness on feet: Secondary | ICD-10-CM | POA: Diagnosis not present

## 2020-11-23 DIAGNOSIS — I509 Heart failure, unspecified: Secondary | ICD-10-CM | POA: Diagnosis not present

## 2020-11-23 DIAGNOSIS — I4891 Unspecified atrial fibrillation: Secondary | ICD-10-CM | POA: Diagnosis not present

## 2020-11-23 DIAGNOSIS — M6281 Muscle weakness (generalized): Secondary | ICD-10-CM | POA: Diagnosis not present

## 2020-11-23 DIAGNOSIS — R338 Other retention of urine: Secondary | ICD-10-CM | POA: Diagnosis not present

## 2020-11-23 DIAGNOSIS — Z85038 Personal history of other malignant neoplasm of large intestine: Secondary | ICD-10-CM | POA: Diagnosis not present

## 2020-11-23 DIAGNOSIS — N401 Enlarged prostate with lower urinary tract symptoms: Secondary | ICD-10-CM | POA: Diagnosis not present

## 2020-11-23 DIAGNOSIS — R5381 Other malaise: Secondary | ICD-10-CM | POA: Diagnosis not present

## 2020-11-23 DIAGNOSIS — U071 COVID-19: Secondary | ICD-10-CM | POA: Diagnosis not present

## 2020-11-23 DIAGNOSIS — I2510 Atherosclerotic heart disease of native coronary artery without angina pectoris: Secondary | ICD-10-CM | POA: Diagnosis not present

## 2020-11-25 DIAGNOSIS — M6281 Muscle weakness (generalized): Secondary | ICD-10-CM | POA: Diagnosis not present

## 2020-11-25 DIAGNOSIS — I4891 Unspecified atrial fibrillation: Secondary | ICD-10-CM | POA: Diagnosis not present

## 2020-11-25 DIAGNOSIS — R2681 Unsteadiness on feet: Secondary | ICD-10-CM | POA: Diagnosis not present

## 2020-11-25 DIAGNOSIS — D649 Anemia, unspecified: Secondary | ICD-10-CM | POA: Diagnosis not present

## 2020-11-25 DIAGNOSIS — I2510 Atherosclerotic heart disease of native coronary artery without angina pectoris: Secondary | ICD-10-CM | POA: Diagnosis not present

## 2020-11-25 DIAGNOSIS — N401 Enlarged prostate with lower urinary tract symptoms: Secondary | ICD-10-CM | POA: Diagnosis not present

## 2020-11-25 DIAGNOSIS — Z85038 Personal history of other malignant neoplasm of large intestine: Secondary | ICD-10-CM | POA: Diagnosis not present

## 2020-11-25 DIAGNOSIS — I509 Heart failure, unspecified: Secondary | ICD-10-CM | POA: Diagnosis not present

## 2020-11-25 DIAGNOSIS — U071 COVID-19: Secondary | ICD-10-CM | POA: Diagnosis not present

## 2020-11-27 DIAGNOSIS — I509 Heart failure, unspecified: Secondary | ICD-10-CM | POA: Diagnosis not present

## 2020-11-27 DIAGNOSIS — R5381 Other malaise: Secondary | ICD-10-CM | POA: Diagnosis not present

## 2020-11-27 DIAGNOSIS — R338 Other retention of urine: Secondary | ICD-10-CM | POA: Diagnosis not present

## 2020-11-27 DIAGNOSIS — R0989 Other specified symptoms and signs involving the circulatory and respiratory systems: Secondary | ICD-10-CM | POA: Diagnosis not present

## 2020-11-28 DIAGNOSIS — I472 Ventricular tachycardia, unspecified: Secondary | ICD-10-CM

## 2020-11-28 HISTORY — DX: Ventricular tachycardia, unspecified: I47.20

## 2020-11-28 HISTORY — DX: Ventricular tachycardia: I47.2

## 2020-11-30 DIAGNOSIS — I4891 Unspecified atrial fibrillation: Secondary | ICD-10-CM | POA: Diagnosis not present

## 2020-11-30 DIAGNOSIS — N401 Enlarged prostate with lower urinary tract symptoms: Secondary | ICD-10-CM | POA: Diagnosis not present

## 2020-11-30 DIAGNOSIS — I2510 Atherosclerotic heart disease of native coronary artery without angina pectoris: Secondary | ICD-10-CM | POA: Diagnosis not present

## 2020-11-30 DIAGNOSIS — U071 COVID-19: Secondary | ICD-10-CM | POA: Diagnosis not present

## 2020-11-30 DIAGNOSIS — D649 Anemia, unspecified: Secondary | ICD-10-CM | POA: Diagnosis not present

## 2020-11-30 DIAGNOSIS — M6281 Muscle weakness (generalized): Secondary | ICD-10-CM | POA: Diagnosis not present

## 2020-11-30 DIAGNOSIS — I509 Heart failure, unspecified: Secondary | ICD-10-CM | POA: Diagnosis not present

## 2020-11-30 DIAGNOSIS — I472 Ventricular tachycardia: Secondary | ICD-10-CM | POA: Diagnosis not present

## 2020-11-30 DIAGNOSIS — I480 Paroxysmal atrial fibrillation: Secondary | ICD-10-CM | POA: Diagnosis not present

## 2020-11-30 DIAGNOSIS — R338 Other retention of urine: Secondary | ICD-10-CM | POA: Diagnosis not present

## 2020-11-30 DIAGNOSIS — Z85038 Personal history of other malignant neoplasm of large intestine: Secondary | ICD-10-CM | POA: Diagnosis not present

## 2020-11-30 DIAGNOSIS — R2681 Unsteadiness on feet: Secondary | ICD-10-CM | POA: Diagnosis not present

## 2020-12-02 ENCOUNTER — Ambulatory Visit (INDEPENDENT_AMBULATORY_CARE_PROVIDER_SITE_OTHER): Payer: Medicare HMO

## 2020-12-02 DIAGNOSIS — I255 Ischemic cardiomyopathy: Secondary | ICD-10-CM | POA: Diagnosis not present

## 2020-12-02 DIAGNOSIS — I5022 Chronic systolic (congestive) heart failure: Secondary | ICD-10-CM | POA: Diagnosis not present

## 2020-12-02 DIAGNOSIS — I9589 Other hypotension: Secondary | ICD-10-CM | POA: Diagnosis not present

## 2020-12-02 DIAGNOSIS — D649 Anemia, unspecified: Secondary | ICD-10-CM | POA: Diagnosis not present

## 2020-12-02 DIAGNOSIS — R1312 Dysphagia, oropharyngeal phase: Secondary | ICD-10-CM | POA: Diagnosis not present

## 2020-12-02 DIAGNOSIS — R5381 Other malaise: Secondary | ICD-10-CM | POA: Diagnosis not present

## 2020-12-02 DIAGNOSIS — I480 Paroxysmal atrial fibrillation: Secondary | ICD-10-CM | POA: Diagnosis not present

## 2020-12-02 DIAGNOSIS — I110 Hypertensive heart disease with heart failure: Secondary | ICD-10-CM | POA: Diagnosis not present

## 2020-12-02 DIAGNOSIS — I2510 Atherosclerotic heart disease of native coronary artery without angina pectoris: Secondary | ICD-10-CM | POA: Diagnosis not present

## 2020-12-02 LAB — CUP PACEART REMOTE DEVICE CHECK
Battery Remaining Longevity: 134 mo
Battery Remaining Percentage: 95.5 %
Battery Voltage: 3.02 V
Brady Statistic RV Percent Paced: 19 %
Date Time Interrogation Session: 20220104155800
Implantable Lead Implant Date: 20091103
Implantable Lead Implant Date: 20091103
Implantable Lead Location: 753859
Implantable Lead Location: 753860
Implantable Lead Model: 5076
Implantable Lead Model: 6947
Implantable Pulse Generator Implant Date: 20161117
Lead Channel Impedance Value: 350 Ohm
Lead Channel Pacing Threshold Amplitude: 1 V
Lead Channel Pacing Threshold Pulse Width: 0.4 ms
Lead Channel Sensing Intrinsic Amplitude: 12 mV
Lead Channel Setting Pacing Amplitude: 2.5 V
Lead Channel Setting Pacing Pulse Width: 0.4 ms
Lead Channel Setting Sensing Sensitivity: 2 mV
Pulse Gen Model: 1240
Pulse Gen Serial Number: 7804513

## 2020-12-03 ENCOUNTER — Other Ambulatory Visit: Payer: Self-pay | Admitting: Physician Assistant

## 2020-12-03 ENCOUNTER — Other Ambulatory Visit: Payer: Self-pay

## 2020-12-03 NOTE — Patient Outreach (Signed)
[place] [facility]) Care Management  12/03/2020  [name] [name] [birth year] [id number]     Transition of Care Referral  Referral Date: 12/03/2020 Referral Source: [facility] Discharge Report Date of Discharge: 12/01/2020 Facility: [place]: [facility]    Referral received. Transition of care calls being completed via EMMI-automated calls. RN CM will outreach patient for any red flags received.     Plan: RN CM will close case at this time.    Enzo Montgomery, RN,BSN,CCM [place] Management Telephonic Care Management Coordinator Direct Phone: [phone] Toll Free: [phone] Fax: [phone]

## 2020-12-04 DIAGNOSIS — R2689 Other abnormalities of gait and mobility: Secondary | ICD-10-CM | POA: Diagnosis not present

## 2020-12-04 DIAGNOSIS — U071 COVID-19: Secondary | ICD-10-CM | POA: Diagnosis not present

## 2020-12-04 DIAGNOSIS — R262 Difficulty in walking, not elsewhere classified: Secondary | ICD-10-CM | POA: Diagnosis not present

## 2020-12-07 DIAGNOSIS — I255 Ischemic cardiomyopathy: Secondary | ICD-10-CM | POA: Diagnosis not present

## 2020-12-07 DIAGNOSIS — D649 Anemia, unspecified: Secondary | ICD-10-CM | POA: Diagnosis not present

## 2020-12-07 DIAGNOSIS — I5022 Chronic systolic (congestive) heart failure: Secondary | ICD-10-CM | POA: Diagnosis not present

## 2020-12-07 DIAGNOSIS — R1312 Dysphagia, oropharyngeal phase: Secondary | ICD-10-CM | POA: Diagnosis not present

## 2020-12-07 DIAGNOSIS — I480 Paroxysmal atrial fibrillation: Secondary | ICD-10-CM | POA: Diagnosis not present

## 2020-12-07 DIAGNOSIS — R5381 Other malaise: Secondary | ICD-10-CM | POA: Diagnosis not present

## 2020-12-07 DIAGNOSIS — I2510 Atherosclerotic heart disease of native coronary artery without angina pectoris: Secondary | ICD-10-CM | POA: Diagnosis not present

## 2020-12-07 DIAGNOSIS — I9589 Other hypotension: Secondary | ICD-10-CM | POA: Diagnosis not present

## 2020-12-07 DIAGNOSIS — I110 Hypertensive heart disease with heart failure: Secondary | ICD-10-CM | POA: Diagnosis not present

## 2020-12-09 ENCOUNTER — Emergency Department (HOSPITAL_COMMUNITY)
Admission: EM | Admit: 2020-12-09 | Discharge: 2020-12-10 | Disposition: A | Discharge status: Home / Self Care | Payer: Medicare HMO | Source: Home / Self Care | Attending: Emergency Medicine | Admitting: Emergency Medicine

## 2020-12-09 DIAGNOSIS — R000 Tachycardia, unspecified: Secondary | ICD-10-CM | POA: Diagnosis not present

## 2020-12-09 DIAGNOSIS — Z20822 Contact with and (suspected) exposure to covid-19: Secondary | ICD-10-CM | POA: Diagnosis not present

## 2020-12-09 DIAGNOSIS — R42 Dizziness and giddiness: Secondary | ICD-10-CM | POA: Insufficient documentation

## 2020-12-09 DIAGNOSIS — K219 Gastro-esophageal reflux disease without esophagitis: Secondary | ICD-10-CM | POA: Diagnosis not present

## 2020-12-09 DIAGNOSIS — R001 Bradycardia, unspecified: Secondary | ICD-10-CM | POA: Diagnosis not present

## 2020-12-09 DIAGNOSIS — I509 Heart failure, unspecified: Secondary | ICD-10-CM | POA: Diagnosis not present

## 2020-12-09 DIAGNOSIS — R569 Unspecified convulsions: Secondary | ICD-10-CM | POA: Diagnosis not present

## 2020-12-09 DIAGNOSIS — I5023 Acute on chronic systolic (congestive) heart failure: Secondary | ICD-10-CM | POA: Insufficient documentation

## 2020-12-09 DIAGNOSIS — J449 Chronic obstructive pulmonary disease, unspecified: Secondary | ICD-10-CM | POA: Diagnosis not present

## 2020-12-09 DIAGNOSIS — I2510 Atherosclerotic heart disease of native coronary artery without angina pectoris: Secondary | ICD-10-CM | POA: Insufficient documentation

## 2020-12-09 DIAGNOSIS — Z9221 Personal history of antineoplastic chemotherapy: Secondary | ICD-10-CM | POA: Diagnosis not present

## 2020-12-09 DIAGNOSIS — I110 Hypertensive heart disease with heart failure: Secondary | ICD-10-CM | POA: Insufficient documentation

## 2020-12-09 DIAGNOSIS — Z87891 Personal history of nicotine dependence: Secondary | ICD-10-CM | POA: Insufficient documentation

## 2020-12-09 DIAGNOSIS — Z85038 Personal history of other malignant neoplasm of large intestine: Secondary | ICD-10-CM | POA: Insufficient documentation

## 2020-12-09 DIAGNOSIS — Z9109 Other allergy status, other than to drugs and biological substances: Secondary | ICD-10-CM | POA: Diagnosis not present

## 2020-12-09 DIAGNOSIS — R55 Syncope and collapse: Secondary | ICD-10-CM | POA: Diagnosis not present

## 2020-12-09 DIAGNOSIS — I4821 Permanent atrial fibrillation: Secondary | ICD-10-CM | POA: Diagnosis not present

## 2020-12-09 DIAGNOSIS — Z95810 Presence of automatic (implantable) cardiac defibrillator: Secondary | ICD-10-CM | POA: Diagnosis not present

## 2020-12-09 DIAGNOSIS — Z8371 Family history of colonic polyps: Secondary | ICD-10-CM | POA: Diagnosis not present

## 2020-12-09 DIAGNOSIS — M2550 Pain in unspecified joint: Secondary | ICD-10-CM | POA: Diagnosis not present

## 2020-12-09 DIAGNOSIS — Z950 Presence of cardiac pacemaker: Secondary | ICD-10-CM | POA: Insufficient documentation

## 2020-12-09 DIAGNOSIS — I255 Ischemic cardiomyopathy: Secondary | ICD-10-CM | POA: Diagnosis not present

## 2020-12-09 DIAGNOSIS — Z7401 Bed confinement status: Secondary | ICD-10-CM | POA: Diagnosis not present

## 2020-12-09 DIAGNOSIS — I472 Ventricular tachycardia: Secondary | ICD-10-CM | POA: Diagnosis not present

## 2020-12-09 DIAGNOSIS — Z800 Family history of malignant neoplasm of digestive organs: Secondary | ICD-10-CM | POA: Diagnosis not present

## 2020-12-09 DIAGNOSIS — R531 Weakness: Secondary | ICD-10-CM | POA: Diagnosis not present

## 2020-12-09 DIAGNOSIS — I5043 Acute on chronic combined systolic (congestive) and diastolic (congestive) heart failure: Secondary | ICD-10-CM | POA: Diagnosis not present

## 2020-12-09 DIAGNOSIS — I252 Old myocardial infarction: Secondary | ICD-10-CM | POA: Diagnosis not present

## 2020-12-09 DIAGNOSIS — U099 Post covid-19 condition, unspecified: Secondary | ICD-10-CM | POA: Diagnosis not present

## 2020-12-09 DIAGNOSIS — Z888 Allergy status to other drugs, medicaments and biological substances status: Secondary | ICD-10-CM | POA: Diagnosis not present

## 2020-12-09 DIAGNOSIS — Z79899 Other long term (current) drug therapy: Secondary | ICD-10-CM | POA: Insufficient documentation

## 2020-12-09 DIAGNOSIS — Z951 Presence of aortocoronary bypass graft: Secondary | ICD-10-CM | POA: Diagnosis not present

## 2020-12-09 DIAGNOSIS — R0902 Hypoxemia: Secondary | ICD-10-CM | POA: Diagnosis not present

## 2020-12-09 DIAGNOSIS — R54 Age-related physical debility: Secondary | ICD-10-CM | POA: Diagnosis present

## 2020-12-09 DIAGNOSIS — Z8616 Personal history of COVID-19: Secondary | ICD-10-CM | POA: Insufficient documentation

## 2020-12-09 DIAGNOSIS — R0602 Shortness of breath: Secondary | ICD-10-CM | POA: Diagnosis not present

## 2020-12-09 DIAGNOSIS — Z923 Personal history of irradiation: Secondary | ICD-10-CM | POA: Diagnosis not present

## 2020-12-09 DIAGNOSIS — Z886 Allergy status to analgesic agent status: Secondary | ICD-10-CM | POA: Diagnosis not present

## 2020-12-09 DIAGNOSIS — R609 Edema, unspecified: Secondary | ICD-10-CM | POA: Diagnosis not present

## 2020-12-09 DIAGNOSIS — I4891 Unspecified atrial fibrillation: Secondary | ICD-10-CM | POA: Diagnosis not present

## 2020-12-09 DIAGNOSIS — Z833 Family history of diabetes mellitus: Secondary | ICD-10-CM | POA: Diagnosis not present

## 2020-12-09 LAB — CBC
HCT: 28.9 % — ABNORMAL LOW (ref 39.0–52.0)
Hemoglobin: 9.1 g/dL — ABNORMAL LOW (ref 13.0–17.0)
MCH: 29.6 pg (ref 26.0–34.0)
MCHC: 31.5 g/dL (ref 30.0–36.0)
MCV: 94.1 fL (ref 80.0–100.0)
Platelets: 279 10*3/uL (ref 150–400)
RBC: 3.07 MIL/uL — ABNORMAL LOW (ref 4.22–5.81)
RDW: 15 % (ref 11.5–15.5)
WBC: 8.9 10*3/uL (ref 4.0–10.5)
nRBC: 0 % (ref 0.0–0.2)

## 2020-12-09 LAB — BASIC METABOLIC PANEL
Anion gap: 11 (ref 5–15)
BUN: 26 mg/dL — ABNORMAL HIGH (ref 8–23)
CO2: 20 mmol/L — ABNORMAL LOW (ref 22–32)
Calcium: 8.7 mg/dL — ABNORMAL LOW (ref 8.9–10.3)
Chloride: 109 mmol/L (ref 98–111)
Creatinine, Ser: 1.45 mg/dL — ABNORMAL HIGH (ref 0.61–1.24)
GFR, Estimated: 47 mL/min — ABNORMAL LOW (ref >60)
Glucose, Bld: 130 mg/dL — ABNORMAL HIGH (ref 70–99)
Potassium: 3.6 mmol/L (ref 3.5–5.1)
Sodium: 140 mmol/L (ref 135–145)

## 2020-12-09 MED ORDER — SODIUM CHLORIDE 0.9 % IV BOLUS: 500.0000 mL | Freq: Once | INTRAVENOUS | Status: DC

## 2020-12-09 NOTE — ED Triage Notes (Signed)
Pt via EMS-reportedly d/c from week long admission for covid. Back at home now and family reports he is weaker than normal. Pt denies pain, just feels weak.

## 2020-12-09 NOTE — Progress Notes (Deleted)
Cardiology Office Note Date:  12/09/2020  Patient ID:  [name] [name], [name] [name] 10-19-1932, MRN [id number] PCP:  Crist Infante, MD  Cardiologist:  Dr. [name] EP: Dr. Rayann Heman   Chief Complaint: *** post hospital   History of Present Illness: [name] [name] is a 87 y.o. male with history of CAD ( CABG 2002), Afib (not on a/c 2/2 GIB characterized as "major", by cardiology note and recurrent), ICM, Chronic CHF (systolic), ICD >> PPM, HTN, HLD.  His AFib described as permanent and rate controlled., not a candidate for watchman or a/c 2/2 GIBs  I note that in 2016 when his ICD was nearing ERI, was discussed given no V arrhythmias, an in appropriate therapy for AF only, LVEF improved to 40%,  and advanced age, though did have 11% V pacing, decided to replace his device with PPM.  He was hospitalized 10/23/20 with progressive and generalized weakness, lethargy, fever, found in WCT, hypotensive, febrile in the ER, anemia, GIB, and COVID +, and acute urinary retension He as cardioverted in the ER to AFib, started on amiodarone, cardiology team followed eventually resumed his BB and signed off case 10/29/20. Received steriods and Remdesivir, HGB remained stable, + melena with no overt bleeding, , discharged   TTE done 10/25/20, noted LVEF 25-30%, global hypokineis, , also mentions severe akinesis basal-mid inferior, inferolateral walls RVEF mod reduced, mildly enlarged, severe elevated pulm pressures, 74.9 LA massively dilated Severe MR Mod TR Full report in the chart Dig was resumed by cards in the hospital given his echo.  Returned to the ER 12/09/20 with persistent fatigue, weakness,  ***  *** VT? *** meds *** amio?, labs *** dig level *** code status    Device information SJM dual chamber PPM generator 09/2015 Leads are MDT RV lead is a [id number] (both RA and RV are from 2009)  AAD Amiodarone very remotely, resumed during hospital stay with GIB, COVID for [facility] 09/2020 ***   Past Medical  History:  Diagnosis Date  . CAD (coronary artery disease)    Old inferolateral MI s/p CABG in 2002  . CAP (community acquired pneumonia) 10/03/2011  . Cardiomyopathy    s/p ICD 09/30/08; EF 35 to 40% per echo in October 2021  . Chronic systolic CHF (congestive heart failure) ([place]) 07/24/2013  . Colon cancer ([place])    s/p chemo, RT, sugery  . Colorectal anastomotic stricture   . Diverticulosis   . ED (erectile dysfunction)   . GERD (gastroesophageal reflux disease)    rarely  . HTN (hypertension)   . ICD (implantable cardiac defibrillator) discharge October 2012   removed 2016    now only PPM  . Iron deficiency anemia   . Paroxysmal atrial fibrillation (HCC)    controlled with amiodarone, not a coumadin candidate due to GI bleeding    Past Surgical History:  Procedure Laterality Date  . APPENDECTOMY    . BACK SURGERY    . BIOPSY  09/22/2018   Procedure: BIOPSY;  Surgeon: Gatha Mayer, MD;  Location: [facility];  Service: Endoscopy;;  . CARDIAC CATHETERIZATION  11/07/2000   EF 32%  . CARDIAC DEFIBRILLATOR PLACEMENT  09/30/08   MDT dual chamber ICD placed by Dr Rayann Heman  . CARDIOVASCULAR STRESS TEST  08/06/2008   EF 30%  . CHOLECYSTECTOMY    . COLON RESECTION     x2  . COLONOSCOPY     multiple  . COLONOSCOPY N/A 09/22/2018   Procedure: COLONOSCOPY;  Surgeon: Gatha Mayer, MD;  Location: [facility];  Service: Endoscopy;  Laterality: N/A;  . CORONARY ARTERY BYPASS GRAFT    . EP IMPLANTABLE DEVICE N/A 10/15/2015   Procedure:  PPM Generator Changeout;  Surgeon: [name], MD;  Location: [facility];  Service: Cardiovascular;  Laterality: N/A;  . HERNIA REPAIR    . US ECHOCARDIOGRAPHY  08/11/2008   EF 30-35%  . VASECTOMY      Current Outpatient Medications  Medication Sig Dispense Refill  . BESIVANCE 0.6 % SUSP Place 1 drop into the right eye 3 (three) times daily.  1  . cholecalciferol (VITAMIN D) 1000 UNITS tablet Take 1,000 Units by mouth daily.     .  digoxin 62.5 MCG TABS Take 0.0625 mg by mouth every other day.    . diphenoxylate-atropine (LOMOTIL) 2.5-0.025 MG tablet Take 1 tablet by mouth 4 (four) times daily as needed for diarrhea or loose stools.     . DUREZOL 0.05 % EMUL Place 1 drop into the right eye 3 (three) times daily.  1  . furosemide (LASIX) 40 MG tablet TAKE 1 TABLET FIVE DAYS A WEEK AND 1/2 TABLET ON SATURDAY AND SUNDAY (Patient taking differently: Take 10-20 mg by mouth See admin instructions. TAKE 1 TABLET FIVE DAYS A WEEK AND 1/2 TABLET ON SATURDAY AND SUNDAY) 78 tablet 2  . metoprolol succinate (TOPROL-XL) 100 MG 24 hr tablet TAKE 1 TABLET (100 MG TOTAL) BY MOUTH DAILY WITH OR IMMEDIATELY FOLLOWING A MEAL (Patient taking differently: Take 100 mg by mouth daily. ) 90 tablet 3  . Multiple Vitamins-Minerals (MULTIVITAMINS THER. W/MINERALS) TABS Take 1 tablet by mouth daily.      . nitroGLYCERIN (NITROSTAT) 0.4 MG SL tablet Place 1 tablet (0.4 mg total) under the tongue every 5 (five) minutes as needed for chest pain. (Patient not taking: Reported on 10/23/2020) 25 tablet 6  . pantoprazole (PROTONIX) 40 MG tablet Take 1 tablet (40 mg total) by mouth 2 (two) times daily.    Marland Kitchen PROAIR HFA 108 (90 BASE) MCG/ACT inhaler Inhale 2 puffs into the lungs every 4 (four) hours as needed for shortness of breath.     . ramipril (ALTACE) 2.5 MG capsule Take 1 capsule (2.5 mg total) by mouth daily.    Marland Kitchen spironolactone (ALDACTONE) 25 MG tablet TAKE 1/2 TABLET EVERY DAY (NEED MD APPOINTMENT) (Patient taking differently: Take 12.5 mg by mouth daily. ) 90 tablet 3  . tamsulosin (FLOMAX) 0.4 MG CAPS capsule Take 1 capsule (0.4 mg total) by mouth daily. 30 capsule 0  . vitamin B-12 (CYANOCOBALAMIN) 1000 MCG tablet Take 1,000 mcg by mouth daily.     No current facility-administered medications for this visit.    Allergies:   Iron, Lescol [fluvastatin sodium], Pentazocine lactate, Vytorin [ezetimibe-simvastatin], and Aspirin   Social History:  The  patient  reports that he has quit smoking. His smoking use included cigars. He has never used smokeless tobacco. He reports that he does not drink alcohol and does not use drugs.   Family History:  The patient's family history includes Colon cancer in his father; Colon polyps in his brother; Diabetes in his sister; Pancreatic cancer (age of onset: 78) in his brother.  ROS:  Please see the history of present illness.  All other systems are reviewed and otherwise negative.   PHYSICAL EXAM:  VS:  There were no vitals taken for this visit. BMI: There is no height or weight on file to calculate BMI. Well nourished, well developed, in no acute distress  HEENT: normocephalic, atraumatic  Neck: no JVD, carotid bruits or masses Cardiac:  *** irreg-irreg; no significant murmurs, no rubs, or gallops Lungs: *** CTA b/l, no wheezing, rhonchi or rales  Abd: soft, nontender MS: no deformity, *** age appropriate atrophy Ext: *** 1+ edema to mid shin b/l, chronic looking skin changes Skin: warm and dry, no rash Neuro:  No gross deficits appreciated Psych: euthymic mood, full affect  *** PPM site is stable, no tethering or discomfort   EKG:  Not done today  PPM interrogation done today and reviewed by myself: ***   10/25/2020: TTE IMPRESSIONS  1. Left ventricular ejection fraction, by estimation, is 25 to 30%. The  left ventricle has severely decreased function. The left ventricle  demonstrates global hypokinesis. Left ventricular diastolic function could  not be evaluated. There is severe  akinesis of the left ventricular, basal-mid inferior and inferolateral  walls. Incoordinate (paced) septal motion.  2. Right ventricular systolic function is moderately reduced. The right  ventricular size is mildly enlarged. There is severely elevated pulmonary  artery systolic pressure. The estimated right ventricular systolic  pressure is 31.5 mmHg.  3. Left atrial size was massively dilated.  4.  Right atrial size was moderately dilated.  5. Mild mitral subvalvular calcification.  6. Mild mitral subvalvular thickening/fibrosis.  7. Tethering of the mitral valve posterior leaflet.  8. The mitral valve is abnormal. Severe mitral valve regurgitation.  9. The tricuspid valve is abnormal. Tricuspid valve regurgitation is  moderate.  10. The aortic valve is tricuspid. Aortic valve regurgitation is mild.  Mild aortic valve sclerosis is present, with no evidence of aortic valve  stenosis.  11. Moderately dilated pulmonary artery.  12. The inferior vena cava is dilated in size with <50% respiratory  variability, suggesting right atrial pressure of 15 mmHg.   Comparison(s): Changes from prior study are noted. 01/04/2016: LVEF 40-45%,  pacer wire, mild AI, moderate MR, moderate LAE, mild RAE.    01/04/2016: TTE Study Conclusions  - Left ventricle: Inferior wall hypokinesis The cavity size was  moderately dilated. Wall thickness was increased in a pattern of  mild LVH. Systolic function was mildly to moderately reduced. The  estimated ejection fraction was in the range of 40% to 45%.  - Aortic valve: There was mild regurgitation.  - Mitral valve: There was moderate regurgitation.  - Left atrium: The atrium was moderately dilated.  - Right ventricle: The cavity size was mildly dilated.  - Right atrium: The atrium was mildly dilated.  - Atrial septum: No defect or patent foramen ovale was identified.  - Pulmonary arteries: PA peak pressure: 71 mm Hg (S).    Recent Labs: 10/23/2020: B Natriuretic Peptide 541.6 10/28/2020: ALT 30 10/29/2020: Magnesium 2.3 12/09/2020: BUN 26; Creatinine, Ser 1.45; Hemoglobin 9.1; Platelets 279; Potassium 3.6; Sodium 140  No results found for requested labs within last 8760 hours.   CrCl cannot be calculated (Unknown ideal weight.).   Wt Readings from Last 3 Encounters:  10/23/20 136 lb 11 oz (62 kg)  06/15/20 137 lb (62.1 kg)  01/08/20 140 lb  (63.5 kg)     Other studies reviewed: Additional studies/records reviewed today include: summarized above  ASSESSMENT AND PLAN:  1. PPM     *** Stable measurements, no programming changes are made  2. ICM 3. Chronic CHF     ***     ***No SOB, lungs are clear     ***On BB/ACE, diurtetic tx    4. CAD     ***  No anginal complaints     *** Following with Dr. Madaline Guthrie  5. HTN     *** Looks OK, no changes  6. Permanent AFib    CHA2DS2Vasc is *** 6, not on a/c 2/2 recurrent GIB history     *** rate controlled  7. WCT, suspect to have been VT     Occurred in the environment of febrile illness, COVID     Marked reduction in LVEF, severe p.HTN, MR during his acute illness     ***   Disposition: ***     Current medicines are reviewed at length with the patient today.  The patient did not have any concerns regarding medicines.  Venetia Night, PA-C 12/09/2020 7:08 PM     [place] [address] [place] [address] [phone] (office)  [phone] (fax)

## 2020-12-09 NOTE — ED Notes (Signed)
Cancelled st jude rep. RN found interrogator

## 2020-12-09 NOTE — ED Provider Notes (Signed)
[facility] EMERGENCY DEPARTMENT Provider Note   CSN: [id number] Arrival date & time: 12/09/20  1525     History Chief Complaint  Patient presents with  . Weakness    [name] [name] is a 87 y.o. male who presents to the emergency department for evaluation of dizziness and chest pain.  The patient was admitted back in December for COVID-19.  He was released from [facility] 1 week ago and has been doing occupational and physical therapy at home.  His daughter is at bedside and primarily provides a history.  She states that 2 days ago during the nighttime he had an episode of extreme confusion.  He has been doing well other than that.  Today he took a nap and around 2 PM he woke up and stated that he was feeling very dizzy.  She states that when she looked at him he was turning blue around his mouth and lips, he became ashen, nauseous and broke out in a cold sweat.  His symptoms seem to resolve however they recurred again just a few minutes later and he had associated chest pain.  He has a [employer] pacemaker in place.  His daughter reports that during his previous admission he had to have his heart shocked.  She is unsure if he is having issues with the pacemaker.  He is feeling back to normal at this time.  He denies room spinning dizziness but did not feel like he was going to pass out.  He denies abdominal pain, fever, chills.  HPI     Past Medical History:  Diagnosis Date  . CAD (coronary artery disease)    Old inferolateral MI s/p CABG in 2002  . CAP (community acquired pneumonia) 10/03/2011  . Cardiomyopathy    s/p ICD 09/30/08; EF 35 to 40% per echo in October 2021  . Chronic systolic CHF (congestive heart failure) ([place]) 07/24/2013  . Colon cancer ([place])    s/p chemo, RT, sugery  . Colorectal anastomotic stricture   . Diverticulosis   . ED (erectile dysfunction)   . GERD (gastroesophageal reflux disease)    rarely  . HTN (hypertension)    . ICD (implantable cardiac defibrillator) discharge October 2012   removed 2016    now only PPM  . Iron deficiency anemia   . Paroxysmal atrial fibrillation (HCC)    controlled with amiodarone, not a coumadin candidate due to GI bleeding    Patient Active Problem List   Diagnosis Date Noted  . Pressure injury of skin 11/04/2020  . Macrocytic anemia 10/23/2020  . Hypoalbuminemia 10/23/2020  . COVID-19   . Sepsis ([place])   . Wide-complex tachycardia ([place])   . GI bleed 09/23/2018  . Ulceration of intestine   . Lower GI bleed 07/01/2018  . ICD in place- MDT 2009 07/03/2014  . History of GI bleed-  07/03/2014  . Essential hypertension 07/03/2014  . Acute on chronic systolic (congestive) heart failure ([place]) 10/15/2013  . Dizziness 10/14/2013  . Chronic systolic CHF (congestive heart failure) ([place]) 07/24/2013  . Ischemic cardiomyopathy 10/14/2011  . Automatic implantable cardioverter/defibrillator (AICD) activation 10/02/2011  . A-fib ([place]) 03/21/2011  . CAD (coronary artery disease) 03/21/2011    Past Surgical History:  Procedure Laterality Date  . APPENDECTOMY    . BACK SURGERY    . BIOPSY  09/22/2018   Procedure: BIOPSY;  Surgeon: Gatha Mayer, MD;  Location: [facility];  Service: Endoscopy;;  . CARDIAC CATHETERIZATION  11/07/2000   EF 32%  . CARDIAC DEFIBRILLATOR PLACEMENT  09/30/08   MDT dual chamber ICD placed by Dr Rayann Heman  . CARDIOVASCULAR STRESS TEST  08/06/2008   EF 30%  . CHOLECYSTECTOMY    . COLON RESECTION     x2  . COLONOSCOPY     multiple  . COLONOSCOPY N/A 09/22/2018   Procedure: COLONOSCOPY;  Surgeon: Gatha Mayer, MD;  [facility];  Service: Endoscopy;  Laterality: N/A;  . CORONARY ARTERY BYPASS GRAFT    . EP IMPLANTABLE DEVICE N/A 10/15/2015   Procedure:  PPM Generator Changeout;  Surgeon: [name], MD;  Location: [facility];  Service: Cardiovascular;  Laterality: N/A;  . HERNIA REPAIR    . US ECHOCARDIOGRAPHY  08/11/2008    EF 30-35%  . VASECTOMY         Family History  Problem Relation Age of Onset  . Colon cancer Father   . Diabetes Sister   . Colon polyps Brother   . Pancreatic cancer Brother 42    Social History   Tobacco Use  . Smoking status: Former Smoker    Types: Cigars  . Smokeless tobacco: Never Used  Vaping Use  . Vaping Use: Never used  Substance Use Topics  . Alcohol use: No  . Drug use: No    Home Medications Prior to Admission medications   Medication Sig Start Date End Date Taking? Authorizing Provider  BESIVANCE 0.6 % SUSP Place 1 drop into the right eye 3 (three) times daily. 08/23/18   [provider]  cholecalciferol (VITAMIN D) 1000 UNITS tablet Take 1,000 Units by mouth daily.     [provider]  digoxin 62.5 MCG TABS Take 0.0625 mg by mouth every other day. 11/02/20   Thurnell Lose, MD  diphenoxylate-atropine (LOMOTIL) 2.5-0.025 MG tablet Take 1 tablet by mouth 4 (four) times daily as needed for diarrhea or loose stools.  12/27/19   [provider]  DUREZOL 0.05 % EMUL Place 1 drop into the right eye 3 (three) times daily. 08/23/18   [provider]  furosemide (LASIX) 40 MG tablet TAKE 1 TABLET FIVE DAYS A WEEK AND 1/2 TABLET ON SATURDAY AND SUNDAY Patient taking differently: Take 10-20 mg by mouth See admin instructions. TAKE 1 TABLET FIVE DAYS A WEEK AND 1/2 TABLET ON SATURDAY AND SUNDAY 08/17/20   Almyra Deforest, PA  metoprolol succinate (TOPROL-XL) 100 MG 24 hr tablet TAKE 1 TABLET (100 MG TOTAL) BY MOUTH DAILY WITH OR IMMEDIATELY FOLLOWING A MEAL Patient taking differently: Take 100 mg by mouth daily.  01/08/20   Almyra Deforest, PA  Multiple Vitamins-Minerals (MULTIVITAMINS THER. W/MINERALS) TABS Take 1 tablet by mouth daily.      [provider]  nitroGLYCERIN (NITROSTAT) 0.4 MG SL tablet Place 1 tablet (0.4 mg total) under the tongue every 5 (five) minutes as needed for chest pain. Patient not taking: Reported on 10/23/2020  05/07/14   [name], Peter M, MD  pantoprazole (PROTONIX) 40 MG tablet Take 1 tablet (40 mg total) by mouth 2 (two) times daily. 11/02/20   Thurnell Lose, MD  PROAIR HFA 108 (90 BASE) MCG/ACT inhaler Inhale 2 puffs into the lungs every 4 (four) hours as needed for shortness of breath.  10/22/13   [provider]  ramipril (ALTACE) 2.5 MG capsule Take 1 capsule (2.5 mg total) by mouth daily. 11/02/20   Thurnell Lose, MD  spironolactone (ALDACTONE) 25 MG tablet TAKE 1/2 TABLET EVERY DAY (  NEED MD APPOINTMENT) Patient taking differently: Take 12.5 mg by mouth daily.  10/12/20   Allred, Jeneen Rinks, MD  tamsulosin (FLOMAX) 0.4 MG CAPS capsule Take 1 capsule (0.4 mg total) by mouth daily. 11/02/20   Thurnell Lose, MD  vitamin B-12 (CYANOCOBALAMIN) 1000 MCG tablet Take 1,000 mcg by mouth daily.    [provider]    Allergies    Iron, Lescol [fluvastatin sodium], Pentazocine lactate, Vytorin [ezetimibe-simvastatin], and Aspirin  Review of Systems   Review of Systems Ten systems reviewed and are negative for acute change, except as noted in the HPI.   Physical Exam Updated Vital Signs BP 121/79   Pulse 83   Temp (!) 97.4 F (36.3 C)   Resp 17   SpO2 (!) 87%   Physical Exam Vitals and nursing note reviewed.  Constitutional:      General: He is not in acute distress.    Appearance: He is well-developed, underweight and well-nourished. He is not ill-appearing, toxic-appearing or diaphoretic.  HENT:     Head: Normocephalic and atraumatic.  Eyes:     General: No scleral icterus.    Extraocular Movements: Extraocular movements intact.     Right eye: No nystagmus.     Left eye: No nystagmus.     Conjunctiva/sclera: Conjunctivae normal.     Pupils: Pupils are equal, round, and reactive to light.  Cardiovascular:     Rate and Rhythm: Normal rate and regular rhythm.     Heart sounds: Normal heart sounds.  Pulmonary:     Effort: Pulmonary effort is normal. No respiratory  distress.     Breath sounds: Normal breath sounds.  Abdominal:     Palpations: Abdomen is soft.     Tenderness: There is no abdominal tenderness.  Musculoskeletal:        General: No edema.     Cervical back: Normal range of motion and neck supple.  Skin:    General: Skin is warm and dry.  Neurological:     General: No focal deficit present.     Mental Status: He is alert and oriented to person, place, and time.     Cranial Nerves: No cranial nerve deficit.     Sensory: No sensory deficit.     Motor: No weakness.     Coordination: Coordination normal.     Gait: Gait normal.     Deep Tendon Reflexes: Reflexes normal.  Psychiatric:        Behavior: Behavior normal.     ED Results / Procedures / Treatments   Labs (all labs ordered are listed, but only abnormal results are displayed) Labs Reviewed  BASIC METABOLIC PANEL - Abnormal; Notable for the following components:      Result Value   CO2 20 (*)    Glucose, Bld 130 (*)    BUN 26 (*)    Creatinine, Ser 1.45 (*)    Calcium 8.7 (*)    GFR, Estimated 47 (*)    All other components within normal limits  CBC - Abnormal; Notable for the following components:   RBC 3.07 (*)    Hemoglobin 9.1 (*)    HCT 28.9 (*)    All other components within normal limits  URINALYSIS, ROUTINE W REFLEX MICROSCOPIC  CBG MONITORING, ED    EKG None  Radiology No results found.  Procedures Procedures (including critical care time)  Medications Ordered in ED Medications  sodium chloride 0.9 % bolus 500 mL (has no administration in time range)  ED Course  I have reviewed the triage vital signs and the nursing notes.  Pertinent labs & imaging results that were available during my care of the patient were reviewed by me and considered in my medical decision making (see chart for details).    MDM Rules/Calculators/A&P                          HY:QMVH syncope VS:  Vitals:   12/10/20 0000 12/10/20 0015 12/10/20 0030 12/10/20 0040   BP: 115/87 115/80 127/76   Pulse: 88 78 60   Resp: 14 (!) 8 (!) 23   Temp:    (!) 97.2 F (36.2 C)  TempSrc:    Oral  SpO2: 95% 100% 97%     QI:ONGEXBM is gathered by patient and daughter at bedside. Previous records obtained and reviewed. DDX:The patient's complaint  involves an extensive number of diagnostic and treatment options, and is a complaint that carries with it a high risk of complications, morbidity, and potential mortality. Given the large differential diagnosis, medical decision making is of high complexity. The differential for syncope is extensive and includes, but is not limited to: arrythmia (Vtach, SVT, SSS, sinus arrest, AV block, bradycardia) aortic stenosis, AMI, HOCM, PE, atrial myxoma, pulmonary hypertension, orthostatic hypotension, (hypovolemia, drug effect, GB syndrome, micturition, cough, swall) carotid sinus sensitivity, Seizure, TIA/CVA, hypoglycemia,  Vertigo.  Labs: I ordered reviewed and interpreted labs which include Cbc which shows not abnormalities BMP with mild bump in CR likely dehydration Imaging: WUX:LKGM controlled afib at a rate of 81 Consults: [id number] here with dizziness or near syncope. His labs look as though he is somewhat dehydrated and I question if he maybe had a bit of ortho-stasis. We did interrogate his pacemaker and according to the [employer] (as reported by nurse Loren Racer) there were no abnormal cardiac rhythms or events that occurred to account for the patient's sxs, which is reassuring. The patient has had no more abnormal events. Sxs are not consistent with TIA or PE. Patient seen in shared visit with attending physician, Dr. Vanita Panda who agrees with assessment, work up , treatment, and plan for discharge with very close Cardiology follow up.  Patient disposition:The patient appears reasonably screened and/or stabilized for discharge and I doubt any other medical condition or other [facility] requiring further  screening, evaluation, or treatment in the ED at this time prior to discharge. I have discussed lab and/or imaging findings with the patient and answered all questions/concerns to the best of my ability.I have discussed return precautions and OP follow up.    Final Clinical Impression(s) / ED Diagnoses Final diagnoses:  Episode of dizziness    Rx / DC Orders ED Discharge Orders    None       Margarita Mail, PA-C 12/14/20 [id number]    Carmin Muskrat, MD 12/16/20 2319

## 2020-12-09 NOTE — Discharge Instructions (Addendum)
Your pacemaker is working appropriately. You will need to follow closely with with your primary care physician and your cardiologist.  Get help right away if you: Have a seizure. Have unusual pain in your chest, abdomen, or back. Faint once or repeatedly. Have a severe headache. Are bleeding from your mouth or rectum, or you have black or tarry stool. Have a very fast or irregular heartbeat (palpitations). Are confused. Have trouble walking. Have severe weakness. Have vision problems.

## 2020-12-10 ENCOUNTER — Telehealth: Payer: Self-pay | Admitting: Cardiology

## 2020-12-10 ENCOUNTER — Ambulatory Visit: Payer: Medicare HMO | Admitting: Nurse Practitioner

## 2020-12-10 ENCOUNTER — Encounter: Payer: Self-pay | Admitting: Cardiology

## 2020-12-10 ENCOUNTER — Telehealth (INDEPENDENT_AMBULATORY_CARE_PROVIDER_SITE_OTHER): Payer: Medicare HMO | Admitting: Cardiology

## 2020-12-10 ENCOUNTER — Encounter: Payer: Medicare HMO | Admitting: Physician Assistant

## 2020-12-10 VITALS — BP 123/91 | HR 128 | Temp 95.4°F | Ht 72.0 in | Wt 130.0 lb

## 2020-12-10 DIAGNOSIS — I2510 Atherosclerotic heart disease of native coronary artery without angina pectoris: Secondary | ICD-10-CM | POA: Diagnosis not present

## 2020-12-10 DIAGNOSIS — I5043 Acute on chronic combined systolic (congestive) and diastolic (congestive) heart failure: Secondary | ICD-10-CM | POA: Diagnosis not present

## 2020-12-10 DIAGNOSIS — I4821 Permanent atrial fibrillation: Secondary | ICD-10-CM

## 2020-12-10 DIAGNOSIS — R0602 Shortness of breath: Secondary | ICD-10-CM

## 2020-12-10 MED ORDER — ALBUTEROL SULFATE HFA 108 (90 BASE) MCG/ACT IN AERS
INHALATION_SPRAY | RESPIRATORY_TRACT | Status: AC
  Administered 2020-12-10: 2 via RESPIRATORY_TRACT
  Filled 2020-12-10: qty 6.7

## 2020-12-10 MED ORDER — ALBUTEROL SULFATE HFA 108 (90 BASE) MCG/ACT IN AERS: 2.0000 | INHALATION_SPRAY | Freq: Once | RESPIRATORY_TRACT | Status: AC

## 2020-12-10 NOTE — Patient Instructions (Signed)
Medication Instructions:  Take Lasix 40 mg every morning Continue all other medications *If you need a refill on your cardiac medications before your next appointment, please call your pharmacy*   Lab Work: Bmet,bnp,digoxin level Friday 1/14 at [place] office   Testing/Procedures: Chest xray Friday 12/11/20 at [place]. No appointment needed Have done before 5:00 pm   Follow-Up: At [facility], you and your health needs are our priority.  As part of our continuing mission to provide you with exceptional heart care, we have created designated Provider Care Teams.  These Care Teams include your primary Cardiologist (physician) and Advanced Practice Providers (APPs -  Physician Assistants and Nurse Practitioners) who all work together to provide you with the care you need, when you need it.  We recommend signing up for the patient portal called "MyChart".  Sign up information is provided on this After Visit Summary.  MyChart is used to connect with patients for Virtual Visits (Telemedicine).  Patients are able to view lab/test results, encounter notes, upcoming appointments, etc.  Non-urgent messages can be sent to your provider as well.   To learn more about what you can do with MyChart, go to [email or web address].    Your next appointment:  Thurs  12/17/20 at 8:40 am   The format for your next appointment: Virtual    Provider: Dr.Jordan

## 2020-12-10 NOTE — Telephone Encounter (Signed)
Patient's daughter would like to speak with a nurse to discuss the patient's recent ED visit.

## 2020-12-10 NOTE — Telephone Encounter (Signed)
Pt's daughter calling to make an appointment with Dr. [name] to follow up with the pt. She states he went to the ER yesterday after seeing the room spinning and being extremely short of breath. Pt was sent   Background: Per daughter,  Nov 26 pt could not get pt out of bed, 911 called. Taken to ED, tested positive for Covid at that time. HR fluctuated a lot during this time. Heart was "Shocked" during this admission. Stayed in the Covid unit until 12/10, then [place] to [facility] for [place]. While at [facility], daughter states patient deconditioned. On January 4 pt was DC'd  home from [place]. Daughter states pt started doing better. However, Monday night pt woke up and "was talking out of his head." Daughter encouraged fluids. Monday an OT therapist came to the house and worked with pt. Pt was visibly short of breath while working with OT but deep breathing seemed to help. OnTuesday morning the pt called daughter saying he had slipped off bed. No injury. Family helped back up. Daughter feels like pt's appetite has waned.   Yesterday pt acutely felt dizzy. "Everything is spinning" Daughter states the pt's face turned reddish/blue and pt was struggling to breathe.  911 called again. BP was very low and EMS had difficulty hearing it because it was so faint.   While at the ER, EKG performed showing afib. Daughter states they also interrogated his defibrillator. Daughter states she was told this showed no abnormal activity. Daughter states he was told he may have complications from "long covid" and was sent home with an inhaler. Daughter is concerned and would like to speak with Dr. [name]. Virtual visit made with approval for today at 3 p.m.

## 2020-12-10 NOTE — Progress Notes (Signed)
Virtual Visit via Telephone Note   This visit type was conducted due to national recommendations for restrictions regarding the COVID-19 Pandemic (e.g. social distancing) in an effort to limit this patient's exposure and mitigate transmission in our community.  Due to his co-morbid illnesses, this patient is at least at moderate risk for complications without adequate follow up.  This format is felt to be most appropriate for this patient at this time.  The patient did not have access to video technology/had technical difficulties with video requiring transitioning to audio format only (telephone).  All issues noted in this document were discussed and addressed.  No physical exam could be performed with this format.  Please refer to the patient's chart for his  consent to telehealth for [facility].    Date:  12/10/2020   ID:  [name] [name], DOB 1932-11-03, MRN [id number] The patient was identified using 2 identifiers.  Patient Location: Home Provider Location: Office/Clinic  PCP:  Crist Infante, MD  Cardiologist:  Trumaine Wimer [name], MD  Electrophysiologist:  [name], MD   Evaluation Performed:  Follow-Up Visit  Chief Complaint:  dyspnea  History of Present Illness:    [name] [name] is a 87 y.o. male with  a history of coronary disease with remote inferior lateral myocardial infarction. He is status post CABG in 2002. He has an ischemic cardiomyopathy with ejection fraction of 35-40% by Echo in November 2012. He is s/p prior ICD implant with last change out in [facility] 2016. He also has a history of atrial fibrillation. In November 2016 he underwent change out of the ICD to a PPM only. He had only had ICD therapies for Afib with RVR before.   In August 2019, he developed acute GI bleed.  CT showed no masses.  He was seen by GI service and in no further work-up was recommended.  He had recurrent GI bleed in October 2019.  He had rapid A. fib during the hospitalization.  Colonoscopy  revealed a mucosal ulceration that was biopsied.  He was considered not a candidate for anticoagulation due to major GI bleed.  He was recently admitted 11/26-12/8/21 with Covid 19 PNA and sepsis. On admission he had a wide complex arrhythmia with hypotension and was emergently cardioverted. He had acute urinary retention. Echo was done showing EF 25-30% with moderate RV dysfunction. He was treated with Remdesivir and steroids. Afib rate controlled with metoprolol and digoxin. He was significantly deconditioned and DC to [place] [facility] for Rehab. Returned home on Jan 4.  Family reports decreased appetite. On Monday he was talking out of his head. Yesterday he developed acute SOB and turned purple. Previously he was able to walk around his room but now just standing he gets SOB. Notes intermittent swelling in the left wrist and forearm. Has swelling in legs L>R but some of this sounds chronic. Had not taken lasix in 5 days. He was taken to the ED last night. Ecg showed AFib with controlled rate of 80. CBC and BMET done which were stable. No CXR done. BP at home is OK but they note HR is 111-128 with minimal activity. No bleeding noted.   The patient does not have symptoms concerning for COVID-19 infection (fever, chills, cough, or new shortness of breath).    Past Medical History:  Diagnosis Date  . CAD (coronary artery disease)    Old inferolateral MI s/p CABG in 2002  . CAP (community acquired pneumonia) 10/03/2011  . Cardiomyopathy    s/p  ICD 09/30/08; EF 35 to 40% per echo in October 2021  . Chronic systolic CHF (congestive heart failure) ([place]) 07/24/2013  . Colon cancer ([place])    s/p chemo, RT, sugery  . Colorectal anastomotic stricture   . Diverticulosis   . ED (erectile dysfunction)   . GERD (gastroesophageal reflux disease)    rarely  . HTN (hypertension)   . ICD (implantable cardiac defibrillator) discharge October 2012   removed 2016    now only PPM  . Iron deficiency anemia   .  Paroxysmal atrial fibrillation (HCC)    controlled with amiodarone, not a coumadin candidate due to GI bleeding   Past Surgical History:  Procedure Laterality Date  . APPENDECTOMY    . BACK SURGERY    . BIOPSY  09/22/2018   Procedure: BIOPSY;  Surgeon: Gatha Mayer, MD;  Location: [facility] ENDOSCOPY;  Service: Endoscopy;;  . CARDIAC CATHETERIZATION  11/07/2000   EF 32%  . CARDIAC DEFIBRILLATOR PLACEMENT  09/30/08   MDT dual chamber ICD placed by Dr Rayann Heman  . CARDIOVASCULAR STRESS TEST  08/06/2008   EF 30%  . CHOLECYSTECTOMY    . COLON RESECTION     x2  . COLONOSCOPY     multiple  . COLONOSCOPY N/A 09/22/2018   Procedure: COLONOSCOPY;  Surgeon: Gatha Mayer, MD;  Location: [facility] ENDOSCOPY;  Service: Endoscopy;  Laterality: N/A;  . CORONARY ARTERY BYPASS GRAFT    . EP IMPLANTABLE DEVICE N/A 10/15/2015   Procedure:  PPM Generator Changeout;  Surgeon: [name], MD;  Location: [place] CV LAB;  Service: Cardiovascular;  Laterality: N/A;  . HERNIA REPAIR    . US ECHOCARDIOGRAPHY  08/11/2008   EF 30-35%  . VASECTOMY       Current Meds  Medication Sig  . BESIVANCE 0.6 % SUSP Place 1 drop into the right eye 3 (three) times daily.  . cholecalciferol (VITAMIN D) 1000 UNITS tablet Take 1,000 Units by mouth daily.  . digoxin 62.5 MCG TABS Take 0.0625 mg by mouth every other day.  . diphenoxylate-atropine (LOMOTIL) 2.5-0.025 MG tablet Take 1 tablet by mouth 4 (four) times daily as needed for diarrhea or loose stools.   . DUREZOL 0.05 % EMUL Place 1 drop into the right eye 3 (three) times daily.  . furosemide (LASIX) 40 MG tablet TAKE 1 TABLET FIVE DAYS A WEEK AND 1/2 TABLET ON SATURDAY AND SUNDAY (Patient taking differently: Take 10-20 mg by mouth See admin instructions. TAKE 1 TABLET FIVE DAYS A WEEK AND 1/2 TABLET ON SATURDAY AND SUNDAY)  . metoprolol succinate (TOPROL-XL) 100 MG 24 hr tablet TAKE 1 TABLET (100 MG TOTAL) BY MOUTH DAILY WITH OR IMMEDIATELY FOLLOWING A MEAL (Patient taking  differently: Take 100 mg by mouth daily.)  . Multiple Vitamins-Minerals (MULTIVITAMINS THER. W/MINERALS) TABS Take 1 tablet by mouth daily.  . nitroGLYCERIN (NITROSTAT) 0.4 MG SL tablet Place 1 tablet (0.4 mg total) under the tongue every 5 (five) minutes as needed for chest pain.  . pantoprazole (PROTONIX) 40 MG tablet Take 1 tablet (40 mg total) by mouth 2 (two) times daily.  Marland Kitchen PROAIR HFA 108 (90 BASE) MCG/ACT inhaler Inhale 2 puffs into the lungs every 4 (four) hours as needed for shortness of breath.   . ramipril (ALTACE) 2.5 MG capsule Take 1 capsule (2.5 mg total) by mouth daily.  Marland Kitchen spironolactone (ALDACTONE) 25 MG tablet TAKE 1/2 TABLET EVERY DAY (NEED MD APPOINTMENT) (Patient taking differently: Take 12.5 mg by mouth daily.)  .  tamsulosin (FLOMAX) 0.4 MG CAPS capsule Take 1 capsule (0.4 mg total) by mouth daily.  . vitamin B-12 (CYANOCOBALAMIN) 1000 MCG tablet Take 1,000 mcg by mouth daily.     Allergies:   Iron, Lescol [fluvastatin sodium], Pentazocine lactate, Vytorin [ezetimibe-simvastatin], and Aspirin   Social History   Tobacco Use  . Smoking status: Former Smoker    Types: Cigars  . Smokeless tobacco: Never Used  Vaping Use  . Vaping Use: Never used  Substance Use Topics  . Alcohol use: No  . Drug use: No     Family Hx: The patient's family history includes Colon cancer in his father; Colon polyps in his brother; Diabetes in his sister; Pancreatic cancer (age of onset: 19) in his brother.  ROS:   Please see the history of present illness.    All other systems reviewed and are negative.   Prior CV studies:   The following studies were reviewed today:  Echo 10/25/20: 1. Left ventricular ejection fraction, by estimation, is 25 to 30%. The left ventricle has severely decreased function. The left ventricle demonstrates global hypokinesis. Left ventricular diastolic function could not be evaluated. There is severe akinesis of the left ventricular, basal-mid inferior  and inferolateral walls. Incoordinate (paced) septal motion. 2. Right ventricular systolic function is moderately reduced. The right ventricular size is mildly enlarged. There is severely elevated pulmonary artery systolic pressure. The estimated right ventricular systolic pressure is 12.4 mmHg. 3. Left atrial size was massively dilated. 4. Right atrial size was moderately dilated. 5. Mild mitral subvalvular calcification. 6. Mild mitral subvalvular thickening/fibrosis. 7. Tethering of the mitral valve posterior leaflet. 8. The mitral valve is abnormal. Severe mitral valve regurgitation. 9. The tricuspid valve is abnormal. Tricuspid valve regurgitation is moderate. 10. The aortic valve is tricuspid. Aortic valve regurgitation is mild. Mild aortic valve sclerosis is present, with no evidence of aortic valve stenosis. 11. Moderately dilated pulmonary artery. 12. The inferior vena cava is dilated in size with <50% respiratory variability, suggesting right atrial pressure of 15 mmHg.   Labs/Other Tests and Data Reviewed:    EKG:  An ECG dated 12/09/20 was personally reviewed today and demonstrated:  Afib rate 80. LVH. no acute change.  Recent Labs: 10/23/2020: B Natriuretic Peptide 541.6 10/28/2020: ALT 30 10/29/2020: Magnesium 2.3 12/09/2020: BUN 26; Creatinine, Ser 1.45; Hemoglobin 9.1; Platelets 279; Potassium 3.6; Sodium 140   Recent Lipid Panel No results found for: CHOL, TRIG, HDL, CHOLHDL, LDLCALC, LDLDIRECT  Wt Readings from Last 3 Encounters:  12/10/20 130 lb (59 kg)  10/23/20 136 lb 11 oz (62 kg)  06/15/20 137 lb (62.1 kg)           Objective:    Vital Signs:  BP (!) 123/91   Pulse (!) 128   Temp (!) 95.4 F (35.2 C)   Ht 6' (1.829 m)   Wt 130 lb (59 kg)   BMI 17.63 kg/m    VITAL SIGNS:  reviewed  ASSESSMENT & PLAN:    1. Dyspnea. Difficult to diagnose over the phone. Potential mechanisms include acute on chronic combined systolic/diastolci CHF since he hasn't  taken lasix in 5 days. EF 25-30% during recent hospital stay. Could be residual from Covid 19 PNA. Need to consider possible PE given recent prolonged hospital stay and Rehab. Will check BNP and dig level. Check PA and lat CXR. If CXR clear consider CT chest for PE. For now will resume lasix 40 mg daily 2. Chronic AFib. Per ED Ecg rate well  controlled on metoprolol and dig. Check dig level. 3. History of recent Covid PNA with respiratory failure 4. Wide complex tachycardia in setting of #3. S/p emergent cardioversion. No recurrence. 5. Chronic anemia 6. History of recurrent GI bleeds. Not a candidate for anticoagulation 7. CAD with remote CABG 10 years ago. No chest pain.        COVID-19 Education: The signs and symptoms of COVID-19 were discussed with the patient and how to seek care for testing (follow up with PCP or arrange E-visit).  The importance of social distancing was discussed today.  Time:   Today, I have spent 20 minutes with the patient with telehealth technology discussing the above problems.     Medication Adjustments/Labs and Tests Ordered: Current medicines are reviewed at length with the patient today.  Concerns regarding medicines are outlined above.   Tests Ordered: No orders of the defined types were placed in this encounter.   Medication Changes: No orders of the defined types were placed in this encounter.   Follow Up:  to be determined.   Signed, Nichalas Coin [name], MD  12/10/2020 3:27 PM    [facility]

## 2020-12-11 ENCOUNTER — Other Ambulatory Visit: Payer: Self-pay

## 2020-12-11 ENCOUNTER — Ambulatory Visit
Admission: RE | Admit: 2020-12-11 | Discharge: 2020-12-11 | Disposition: A | Discharge status: Home / Self Care | Payer: Medicare HMO | Source: Ambulatory Visit | Attending: Cardiology | Admitting: Cardiology

## 2020-12-11 DIAGNOSIS — R0602 Shortness of breath: Secondary | ICD-10-CM

## 2020-12-11 DIAGNOSIS — I4821 Permanent atrial fibrillation: Secondary | ICD-10-CM

## 2020-12-11 DIAGNOSIS — I2510 Atherosclerotic heart disease of native coronary artery without angina pectoris: Secondary | ICD-10-CM

## 2020-12-11 DIAGNOSIS — I5043 Acute on chronic combined systolic (congestive) and diastolic (congestive) heart failure: Secondary | ICD-10-CM

## 2020-12-13 ENCOUNTER — Emergency Department (HOSPITAL_COMMUNITY): Payer: Medicare HMO

## 2020-12-13 ENCOUNTER — Inpatient Hospital Stay (HOSPITAL_COMMUNITY)
Admission: EM | Admit: 2020-12-13 | Discharge: 2020-12-17 | DRG: 308 | Disposition: A | Discharge status: Skilled Nursing Facility | Payer: Medicare HMO | Attending: Cardiology | Admitting: Cardiology

## 2020-12-13 DIAGNOSIS — Z8371 Family history of colonic polyps: Secondary | ICD-10-CM

## 2020-12-13 DIAGNOSIS — Z87891 Personal history of nicotine dependence: Secondary | ICD-10-CM

## 2020-12-13 DIAGNOSIS — Z888 Allergy status to other drugs, medicaments and biological substances status: Secondary | ICD-10-CM

## 2020-12-13 DIAGNOSIS — Z9109 Other allergy status, other than to drugs and biological substances: Secondary | ICD-10-CM

## 2020-12-13 DIAGNOSIS — R54 Age-related physical debility: Secondary | ICD-10-CM | POA: Diagnosis present

## 2020-12-13 DIAGNOSIS — I472 Ventricular tachycardia, unspecified: Secondary | ICD-10-CM

## 2020-12-13 DIAGNOSIS — Z85038 Personal history of other malignant neoplasm of large intestine: Secondary | ICD-10-CM | POA: Diagnosis not present

## 2020-12-13 DIAGNOSIS — Z95810 Presence of automatic (implantable) cardiac defibrillator: Secondary | ICD-10-CM | POA: Diagnosis not present

## 2020-12-13 DIAGNOSIS — I4821 Permanent atrial fibrillation: Secondary | ICD-10-CM | POA: Diagnosis present

## 2020-12-13 DIAGNOSIS — R0602 Shortness of breath: Secondary | ICD-10-CM | POA: Diagnosis present

## 2020-12-13 DIAGNOSIS — I2510 Atherosclerotic heart disease of native coronary artery without angina pectoris: Secondary | ICD-10-CM | POA: Diagnosis present

## 2020-12-13 DIAGNOSIS — Z9221 Personal history of antineoplastic chemotherapy: Secondary | ICD-10-CM | POA: Diagnosis not present

## 2020-12-13 DIAGNOSIS — Z923 Personal history of irradiation: Secondary | ICD-10-CM | POA: Diagnosis not present

## 2020-12-13 DIAGNOSIS — K219 Gastro-esophageal reflux disease without esophagitis: Secondary | ICD-10-CM | POA: Diagnosis present

## 2020-12-13 DIAGNOSIS — I255 Ischemic cardiomyopathy: Secondary | ICD-10-CM | POA: Diagnosis present

## 2020-12-13 DIAGNOSIS — Z800 Family history of malignant neoplasm of digestive organs: Secondary | ICD-10-CM | POA: Diagnosis not present

## 2020-12-13 DIAGNOSIS — Z79899 Other long term (current) drug therapy: Secondary | ICD-10-CM | POA: Diagnosis not present

## 2020-12-13 DIAGNOSIS — I110 Hypertensive heart disease with heart failure: Secondary | ICD-10-CM | POA: Diagnosis present

## 2020-12-13 DIAGNOSIS — U099 Post covid-19 condition, unspecified: Secondary | ICD-10-CM | POA: Diagnosis present

## 2020-12-13 DIAGNOSIS — I5023 Acute on chronic systolic (congestive) heart failure: Secondary | ICD-10-CM | POA: Diagnosis present

## 2020-12-13 DIAGNOSIS — Z951 Presence of aortocoronary bypass graft: Secondary | ICD-10-CM

## 2020-12-13 DIAGNOSIS — Z20822 Contact with and (suspected) exposure to covid-19: Secondary | ICD-10-CM | POA: Diagnosis present

## 2020-12-13 DIAGNOSIS — I252 Old myocardial infarction: Secondary | ICD-10-CM

## 2020-12-13 DIAGNOSIS — Z886 Allergy status to analgesic agent status: Secondary | ICD-10-CM

## 2020-12-13 DIAGNOSIS — R55 Syncope and collapse: Secondary | ICD-10-CM

## 2020-12-13 DIAGNOSIS — Z833 Family history of diabetes mellitus: Secondary | ICD-10-CM | POA: Diagnosis not present

## 2020-12-13 LAB — URINALYSIS, ROUTINE W REFLEX MICROSCOPIC
Bacteria, UA: NONE SEEN
Bilirubin Urine: NEGATIVE
Glucose, UA: NEGATIVE mg/dL
Hgb urine dipstick: NEGATIVE
Ketones, ur: NEGATIVE mg/dL
Leukocytes,Ua: NEGATIVE
Nitrite: NEGATIVE
Protein, ur: 30 mg/dL — AB
Specific Gravity, Urine: 1.008 (ref 1.005–1.030)
pH: 5 (ref 5.0–8.0)

## 2020-12-13 LAB — CBC WITH DIFFERENTIAL/PLATELET
Abs Immature Granulocytes: 0.08 10*3/uL — ABNORMAL HIGH (ref 0.00–0.07)
Basophils Absolute: 0 10*3/uL (ref 0.0–0.1)
Basophils Relative: 0 %
Eosinophils Absolute: 0.2 10*3/uL (ref 0.0–0.5)
Eosinophils Relative: 2 %
HCT: 30.2 % — ABNORMAL LOW (ref 39.0–52.0)
Hemoglobin: 9.3 g/dL — ABNORMAL LOW (ref 13.0–17.0)
Immature Granulocytes: 1 %
Lymphocytes Relative: 9 %
Lymphs Abs: 1 10*3/uL (ref 0.7–4.0)
MCH: 29.2 pg (ref 26.0–34.0)
MCHC: 30.8 g/dL (ref 30.0–36.0)
MCV: 94.7 fL (ref 80.0–100.0)
Monocytes Absolute: 0.8 10*3/uL (ref 0.1–1.0)
Monocytes Relative: 8 %
Neutro Abs: 8.2 10*3/uL — ABNORMAL HIGH (ref 1.7–7.7)
Neutrophils Relative %: 80 %
Platelets: 284 10*3/uL (ref 150–400)
RBC: 3.19 MIL/uL — ABNORMAL LOW (ref 4.22–5.81)
RDW: 15.2 % (ref 11.5–15.5)
WBC: 10.2 10*3/uL (ref 4.0–10.5)
nRBC: 0 % (ref 0.0–0.2)

## 2020-12-13 LAB — POC OCCULT BLOOD, ED: Fecal Occult Bld: NEGATIVE

## 2020-12-13 LAB — COMPREHENSIVE METABOLIC PANEL
ALT: 18 U/L (ref 0–44)
AST: 37 U/L (ref 15–41)
Albumin: 3 g/dL — ABNORMAL LOW (ref 3.5–5.0)
Alkaline Phosphatase: 51 U/L (ref 38–126)
Anion gap: 13 (ref 5–15)
BUN: 20 mg/dL (ref 8–23)
CO2: 24 mmol/L (ref 22–32)
Calcium: 8.3 mg/dL — ABNORMAL LOW (ref 8.9–10.3)
Chloride: 102 mmol/L (ref 98–111)
Creatinine, Ser: 1.57 mg/dL — ABNORMAL HIGH (ref 0.61–1.24)
GFR, Estimated: 43 mL/min — ABNORMAL LOW (ref >60)
Glucose, Bld: 175 mg/dL — ABNORMAL HIGH (ref 70–99)
Potassium: 3.5 mmol/L (ref 3.5–5.1)
Sodium: 139 mmol/L (ref 135–145)
Total Bilirubin: 1.9 mg/dL — ABNORMAL HIGH (ref 0.3–1.2)
Total Protein: 5.6 g/dL — ABNORMAL LOW (ref 6.5–8.1)

## 2020-12-13 LAB — RESP PANEL BY RT-PCR (FLU A&B, COVID) ARPGX2
Influenza A by PCR: NEGATIVE
Influenza B by PCR: NEGATIVE
SARS Coronavirus 2 by RT PCR: NEGATIVE

## 2020-12-13 LAB — TROPONIN I (HIGH SENSITIVITY)
Troponin I (High Sensitivity): 23 ng/L — ABNORMAL HIGH (ref <18)
Troponin I (High Sensitivity): 25 ng/L — ABNORMAL HIGH (ref <18)

## 2020-12-13 LAB — BRAIN NATRIURETIC PEPTIDE: B Natriuretic Peptide: 436 pg/mL — ABNORMAL HIGH (ref 0.0–100.0)

## 2020-12-13 LAB — DIGOXIN LEVEL: Digoxin Level: 0.3 ng/mL — ABNORMAL LOW (ref 0.8–2.0)

## 2020-12-13 MED ORDER — AMIODARONE HCL IN DEXTROSE 360-4.14 MG/200ML-% IV SOLN
30.0000 mg/h | INTRAVENOUS | Status: DC
  Administered 2020-12-14 (×2): 30 mg/h via INTRAVENOUS
  Filled 2020-12-13 (×3): qty 200

## 2020-12-13 MED ORDER — VITAMIN B-12 1000 MCG PO TABS
1000.0000 ug | ORAL_TABLET | Freq: Every day | ORAL | Status: DC
  Administered 2020-12-14 – 2020-12-17 (×4): 1000 ug via ORAL
  Filled 2020-12-13 (×4): qty 1

## 2020-12-13 MED ORDER — DIFLUPREDNATE 0.05 % OP EMUL: 1.0000 [drp] | Freq: Three times a day (TID) | OPHTHALMIC | Status: DC

## 2020-12-13 MED ORDER — ONDANSETRON HCL 4 MG/2ML IJ SOLN: 4.0000 mg | Freq: Four times a day (QID) | INTRAMUSCULAR | Status: DC | PRN

## 2020-12-13 MED ORDER — ALBUTEROL SULFATE HFA 108 (90 BASE) MCG/ACT IN AERS
2.0000 | INHALATION_SPRAY | RESPIRATORY_TRACT | Status: DC | PRN
  Filled 2020-12-13: qty 6.7

## 2020-12-13 MED ORDER — AMIODARONE IV BOLUS ONLY 150 MG/100ML
150.0000 mg | Freq: Once | INTRAVENOUS | Status: AC
  Administered 2020-12-13: 150 mg via INTRAVENOUS

## 2020-12-13 MED ORDER — TAMSULOSIN HCL 0.4 MG PO CAPS
0.4000 mg | ORAL_CAPSULE | Freq: Every day | ORAL | Status: DC
  Administered 2020-12-14 – 2020-12-17 (×4): 0.4 mg via ORAL
  Filled 2020-12-13 (×4): qty 1

## 2020-12-13 MED ORDER — ADULT MULTIVITAMIN W/MINERALS CH
1.0000 | ORAL_TABLET | Freq: Every day | ORAL | Status: DC
  Administered 2020-12-14 – 2020-12-17 (×4): 1 via ORAL
  Filled 2020-12-13 (×4): qty 1

## 2020-12-13 MED ORDER — METOPROLOL SUCCINATE ER 100 MG PO TB24
100.0000 mg | ORAL_TABLET | Freq: Every day | ORAL | Status: DC
  Administered 2020-12-14 – 2020-12-17 (×4): 100 mg via ORAL
  Filled 2020-12-13 (×4): qty 1

## 2020-12-13 MED ORDER — POTASSIUM CHLORIDE CRYS ER 20 MEQ PO TBCR
10.0000 meq | EXTENDED_RELEASE_TABLET | Freq: Two times a day (BID) | ORAL | Status: DC
Start: 2020-12-13 — End: 2020-12-14
  Administered 2020-12-14: 10 meq via ORAL
  Filled 2020-12-13: qty 1

## 2020-12-13 MED ORDER — FUROSEMIDE 10 MG/ML IJ SOLN
40.0000 mg | Freq: Once | INTRAMUSCULAR | Status: AC
  Administered 2020-12-13: 40 mg via INTRAVENOUS
  Filled 2020-12-13: qty 4

## 2020-12-13 MED ORDER — SODIUM CHLORIDE 0.9% FLUSH: 3.0000 mL | INTRAVENOUS | Status: DC | PRN

## 2020-12-13 MED ORDER — DIPHENOXYLATE-ATROPINE 2.5-0.025 MG PO TABS: 1.0000 | ORAL_TABLET | Freq: Four times a day (QID) | ORAL | Status: DC | PRN

## 2020-12-13 MED ORDER — FUROSEMIDE 10 MG/ML IJ SOLN
40.0000 mg | Freq: Two times a day (BID) | INTRAMUSCULAR | Status: DC
  Administered 2020-12-14 – 2020-12-15 (×3): 40 mg via INTRAVENOUS
  Filled 2020-12-13 (×3): qty 4

## 2020-12-13 MED ORDER — NITROGLYCERIN 0.4 MG SL SUBL: 0.4000 mg | SUBLINGUAL_TABLET | SUBLINGUAL | Status: DC | PRN

## 2020-12-13 MED ORDER — PANTOPRAZOLE SODIUM 40 MG PO TBEC
40.0000 mg | DELAYED_RELEASE_TABLET | Freq: Two times a day (BID) | ORAL | Status: DC
  Administered 2020-12-14 – 2020-12-17 (×8): 40 mg via ORAL
  Filled 2020-12-13 (×8): qty 1

## 2020-12-13 MED ORDER — AMIODARONE HCL IN DEXTROSE 360-4.14 MG/200ML-% IV SOLN
60.0000 mg/h | INTRAVENOUS | Status: AC
  Administered 2020-12-14: 60 mg/h via INTRAVENOUS
  Filled 2020-12-13: qty 200

## 2020-12-13 MED ORDER — SODIUM CHLORIDE 0.9 % IV SOLN: 250.0000 mL | INTRAVENOUS | Status: DC | PRN

## 2020-12-13 MED ORDER — VITAMIN D 25 MCG (1000 UNIT) PO TABS
1000.0000 [IU] | ORAL_TABLET | Freq: Every day | ORAL | Status: DC
  Administered 2020-12-14 – 2020-12-17 (×4): 1000 [IU] via ORAL
  Filled 2020-12-13 (×4): qty 1

## 2020-12-13 MED ORDER — GATIFLOXACIN 0.5 % OP SOLN
1.0000 [drp] | Freq: Three times a day (TID) | OPHTHALMIC | Status: DC
  Administered 2020-12-14 – 2020-12-17 (×11): 1 [drp] via OPHTHALMIC
  Filled 2020-12-13: qty 2.5

## 2020-12-13 MED ORDER — ENOXAPARIN SODIUM 30 MG/0.3ML ~~LOC~~ SOLN: 30.0000 mg | Freq: Every day | SUBCUTANEOUS | Status: DC

## 2020-12-13 MED ORDER — DIGOXIN 0.0625 MG HALF TABLET
0.0625 mg | ORAL_TABLET | ORAL | Status: DC
  Administered 2020-12-14 – 2020-12-16 (×2): 0.0625 mg via ORAL
  Filled 2020-12-13 (×2): qty 1

## 2020-12-13 MED ORDER — SODIUM CHLORIDE 0.9% FLUSH
3.0000 mL | Freq: Two times a day (BID) | INTRAVENOUS | Status: DC
  Administered 2020-12-15 – 2020-12-17 (×4): 3 mL via INTRAVENOUS

## 2020-12-13 MED ORDER — ACETAMINOPHEN 325 MG PO TABS: 650.0000 mg | ORAL_TABLET | ORAL | Status: DC | PRN

## 2020-12-13 NOTE — ED Provider Notes (Signed)
[place] EMERGENCY DEPARTMENT Provider Note   CSN: [id number] Arrival date & time: 12/13/20  1204     History Chief Complaint  Patient presents with  . Seizures    [name] [name] is a 87 y.o. male.  Patient is an 87 year old male with multiple medical problems including coronary artery disease, cardiomyopathy with an EF of 25 to 30% with severe LV dysfunction, CHF on Lasix, prior colon cancer with resection x2, paroxysmal atrial fibrillation not a candidate for anticoagulation due to GI bleeding and ICD who is presenting today with an episode of loss of consciousness.  Patient has had multiple significant issues over the last 1-1/2 months.  He was diagnosed with COVID on 1126 and required admission to the hospital till 11/06/2020 and then was discharged to [facility] for rehab.  Patient was discharged from rehab on 12/01/2020 and has since been at home with physical therapy, Occupational Therapy and home health.  Daughter has been speaking with his doctors as she has been concerned about him over the last 3 to 4 days he has had worsening shortness of breath.  He was seen in the emergency room 2 days ago because he was having worsening weakness, dizziness, shortness of breath with exertion and did have an episode of chest pain at that time.  He had his pacemaker interrogated which showed no acute events and labs were reassuring.  He was discharged home however based on notes from his daughter she spoke with the cardiologist who he saw on Friday due to persistent exertional dyspnea, change of his coloring when he tries to walk and some confusion.  Today patient reports that he remembers waking up and having breakfast he then had a sudden episode of feeling short of breath and generally not feeling well and then does not remember what happened.  A person at the home reported that he started have some generalized shaking and unresponsiveness.  When EMS arrived they reported that  patient was having shallow breathing and they could not feel a carotid pulse.  They sat him up and patient became alert and started answering questions.  He had no evidence of postictal confusion and there was no report of trauma.  Patient states he did have a bowel movement this morning and there was no evidence of bleeding or dark stool.  He is not having any chest pain, abdominal pain and denies any shortness of breath at this time.  He had been off of his Lasix for approximately 4 days but cardiology recommended he start taking it 2 days ago.  We will need to confirm with his daughter.  The history is provided by the patient, the EMS personnel and medical records.       Past Medical History:  Diagnosis Date  . CAD (coronary artery disease)    Old inferolateral MI s/p CABG in 2002  . CAP (community acquired pneumonia) 10/03/2011  . Cardiomyopathy    s/p ICD 09/30/08; EF 35 to 40% per echo in October 2021  . Chronic systolic CHF (congestive heart failure) ([place]) 07/24/2013  . Colon cancer ([place])    s/p chemo, RT, sugery  . Colorectal anastomotic stricture   . Diverticulosis   . ED (erectile dysfunction)   . GERD (gastroesophageal reflux disease)    rarely  . HTN (hypertension)   . ICD (implantable cardiac defibrillator) discharge October 2012   removed 2016    now only PPM  . Iron deficiency anemia   . Paroxysmal atrial  fibrillation ([place])    controlled with amiodarone, not a coumadin candidate due to GI bleeding    Patient Active Problem List   Diagnosis Date Noted  . Pressure injury of skin 11/04/2020  . Macrocytic anemia 10/23/2020  . Hypoalbuminemia 10/23/2020  . COVID-19   . Sepsis ([place])   . Wide-complex tachycardia ([place])   . GI bleed 09/23/2018  . Ulceration of intestine   . Lower GI bleed 07/01/2018  . ICD in place- MDT 2009 07/03/2014  . History of GI bleed-  07/03/2014  . Essential hypertension 07/03/2014  . Acute on chronic systolic (congestive) heart failure ([place])  10/15/2013  . Dizziness 10/14/2013  . Chronic systolic CHF (congestive heart failure) ([place]) 07/24/2013  . Ischemic cardiomyopathy 10/14/2011  . Automatic implantable cardioverter/defibrillator (AICD) activation 10/02/2011  . A-fib ([place]) 03/21/2011  . CAD (coronary artery disease) 03/21/2011    Past Surgical History:  Procedure Laterality Date  . APPENDECTOMY    . BACK SURGERY    . BIOPSY  09/22/2018   Procedure: BIOPSY;  Surgeon: Gatha Mayer, MD;  Location: [facility] ENDOSCOPY;  Service: Endoscopy;;  . CARDIAC CATHETERIZATION  11/07/2000   EF 32%  . CARDIAC DEFIBRILLATOR PLACEMENT  09/30/08   MDT dual chamber ICD placed by Dr Rayann Heman  . CARDIOVASCULAR STRESS TEST  08/06/2008   EF 30%  . CHOLECYSTECTOMY    . COLON RESECTION     x2  . COLONOSCOPY     multiple  . COLONOSCOPY N/A 09/22/2018   Procedure: COLONOSCOPY;  Surgeon: Gatha Mayer, MD;  Location: [facility] ENDOSCOPY;  Service: Endoscopy;  Laterality: N/A;  . CORONARY ARTERY BYPASS GRAFT    . EP IMPLANTABLE DEVICE N/A 10/15/2015   Procedure:  PPM Generator Changeout;  Surgeon: [name], MD;  Location: [facility];  Service: Cardiovascular;  Laterality: N/A;  . HERNIA REPAIR    . US ECHOCARDIOGRAPHY  08/11/2008   EF 30-35%  . VASECTOMY         Family History  Problem Relation Age of Onset  . Colon cancer Father   . Diabetes Sister   . Colon polyps Brother   . Pancreatic cancer Brother 61    Social History   Tobacco Use  . Smoking status: Former Smoker    Types: Cigars  . Smokeless tobacco: Never Used  Vaping Use  . Vaping Use: Never used  Substance Use Topics  . Alcohol use: No  . Drug use: No    Home Medications Prior to Admission medications   Medication Sig Start Date End Date Taking? Authorizing Provider  BESIVANCE 0.6 % SUSP Place 1 drop into the right eye 3 (three) times daily. 08/23/18   [provider]  cholecalciferol (VITAMIN D) 1000 UNITS tablet Take 1,000 Units by mouth daily.     [provider]  digoxin 62.5 MCG TABS Take 0.0625 mg by mouth every other day. 11/02/20   Thurnell Lose, MD  diphenoxylate-atropine (LOMOTIL) 2.5-0.025 MG tablet Take 1 tablet by mouth 4 (four) times daily as needed for diarrhea or loose stools.  12/27/19   [provider]  DUREZOL 0.05 % EMUL Place 1 drop into the right eye 3 (three) times daily. 08/23/18   [provider]  furosemide (LASIX) 40 MG tablet Take 1 tablet (40 mg total) by mouth daily. 12/10/20   [name], Peter M, MD  metoprolol succinate (TOPROL-XL) 100 MG 24 hr tablet TAKE 1 TABLET (100 MG TOTAL) BY MOUTH DAILY WITH OR IMMEDIATELY FOLLOWING A  MEAL Patient taking differently: Take 100 mg by mouth daily. 01/08/20   Almyra Deforest, PA  Multiple Vitamins-Minerals (MULTIVITAMINS THER. W/MINERALS) TABS Take 1 tablet by mouth daily.    [provider]  nitroGLYCERIN (NITROSTAT) 0.4 MG SL tablet Place 1 tablet (0.4 mg total) under the tongue every 5 (five) minutes as needed for chest pain. 05/07/14   [name], Peter M, MD  pantoprazole (PROTONIX) 40 MG tablet Take 1 tablet (40 mg total) by mouth 2 (two) times daily. 11/02/20   Thurnell Lose, MD  PROAIR HFA 108 (90 BASE) MCG/ACT inhaler Inhale 2 puffs into the lungs every 4 (four) hours as needed for shortness of breath.  10/22/13   [provider]  ramipril (ALTACE) 2.5 MG capsule Take 1 capsule (2.5 mg total) by mouth daily. 11/02/20   Thurnell Lose, MD  spironolactone (ALDACTONE) 25 MG tablet TAKE 1/2 TABLET EVERY DAY (NEED MD APPOINTMENT) Patient taking differently: Take 12.5 mg by mouth daily. 10/12/20   Allred, Jeneen Rinks, MD  tamsulosin (FLOMAX) 0.4 MG CAPS capsule Take 1 capsule (0.4 mg total) by mouth daily. 11/02/20   Thurnell Lose, MD  vitamin B-12 (CYANOCOBALAMIN) 1000 MCG tablet Take 1,000 mcg by mouth daily.    [provider]    Allergies    Iron, Lescol [fluvastatin sodium], Pentazocine lactate, Vytorin  [ezetimibe-simvastatin], and Aspirin  Review of Systems   Review of Systems  All other systems reviewed and are negative.   Physical Exam Updated Vital Signs BP 106/74   Pulse 83   Temp 98 F (36.7 C) (Rectal)   Resp (!) 22   SpO2 100%   Physical Exam Vitals and nursing note reviewed.  Constitutional:      General: He is not in acute distress.    Appearance: He is well-developed, underweight and well-nourished.     Comments: Frail appearing chronically ill elderly gentleman  HENT:     Head: Normocephalic and atraumatic.     Mouth/Throat:     Mouth: Oropharynx is clear and moist.  Eyes:     Extraocular Movements: EOM normal.     Conjunctiva/sclera: Conjunctivae normal.     Pupils: Pupils are equal, round, and reactive to light.  Cardiovascular:     Rate and Rhythm: Normal rate. Rhythm irregular.     Pulses: Intact distal pulses.     Heart sounds: No murmur heard.     Comments: Pacemaker present in the left upper chest.  1+ radial pulse bilaterally.  Faint 1+ DP pulse present in bilateral lower extremities. Pulmonary:     Effort: Pulmonary effort is normal. No respiratory distress.     Breath sounds: Normal breath sounds. No wheezing or rales.  Abdominal:     General: There is no distension.     Palpations: Abdomen is soft.     Tenderness: There is no abdominal tenderness. There is no guarding or rebound.  Genitourinary:    Rectum: Guaiac result negative.     Comments: Stool is brown Musculoskeletal:        General: No tenderness or edema. Normal range of motion.     Cervical back: Normal range of motion and neck supple.     Right lower leg: No edema.     Left lower leg: No edema.     Comments: No notable edema present in the lower extremities.  Mild duskiness present in the right foot but sensation and intact, capillary refill less then 3 seconds and faint pulse palpated  Skin:  General: Skin is warm and dry.     Coloration: Skin is pale.     Findings: No  erythema or rash.     Comments: Multiple areas of healing ecchymosis over the upper/lower ext  Neurological:     Mental Status: He is alert and oriented to person, place, and time. Mental status is at baseline.     Sensory: Sensation is intact.     Motor: Motor function is intact.  Psychiatric:        Mood and Affect: Mood and affect and mood normal.        Behavior: Behavior normal.        Thought Content: Thought content normal.     Comments: No evidence of confusion at this time and patient behaving appropriately     ED Results / Procedures / Treatments   Labs (all labs ordered are listed, but only abnormal results are displayed) Labs Reviewed  CBC WITH DIFFERENTIAL/PLATELET - Abnormal; Notable for the following components:      Result Value   RBC 3.19 (*)    Hemoglobin 9.3 (*)    HCT 30.2 (*)    Neutro Abs 8.2 (*)    Abs Immature Granulocytes 0.08 (*)    All other components within normal limits  COMPREHENSIVE METABOLIC PANEL - Abnormal; Notable for the following components:   Glucose, Bld 175 (*)    Creatinine, Ser 1.57 (*)    Calcium 8.3 (*)    Total Protein 5.6 (*)    Albumin 3.0 (*)    Total Bilirubin 1.9 (*)    GFR, Estimated 43 (*)    All other components within normal limits  DIGOXIN LEVEL - Abnormal; Notable for the following components:   Digoxin Level 0.3 (*)    All other components within normal limits  BRAIN NATRIURETIC PEPTIDE - Abnormal; Notable for the following components:   B Natriuretic Peptide 436.0 (*)    All other components within normal limits  TROPONIN I (HIGH SENSITIVITY) - Abnormal; Notable for the following components:   Troponin I (High Sensitivity) 23 (*)    All other components within normal limits  URINALYSIS, ROUTINE W REFLEX MICROSCOPIC  POC OCCULT BLOOD, ED    EKG EKG Interpretation  Date/Time:  Sunday December 13 2020 12:47:17 EST Ventricular Rate:  182 PR Interval:    QRS Duration: 167 QT Interval:  313 QTC  Calculation: 545 R Axis:   -71 Text Interpretation: new Extreme tachycardia with wide complex, no further rhythm analysis attempted V tach vs Afib with abberancy Confirmed by Blanchie Dessert [phone]) on 12/13/2020 1:35:18 PM   Radiology DG Chest Port 1 View  Result Date: 12/13/2020 CLINICAL DATA:  . Pt's daughter reports seizure-like activity lasting appro 1 minute. No h of seizure. H of CHF, COPD, A-Fib.Fire department arrived prior to EMS reported shallow breathing and unable to obtain carotid pulse EXAM: PORTABLE CHEST 1 VIEW COMPARISON:  12/11/2020 FINDINGS: Stable changes from prior CABG surgery. Cardiac silhouette is mildly enlarged. Mediastinal or hilar masses. Left anterior chest wall AICD is stable. Prominent interstitial and vascular markings bilaterally. There is additional opacity at the left lung base, which appears more prominent than on the recent prior study. This may reflect atelectasis or infiltrate. Possible small left pleural effusion. No evidence of a right pleural effusion. No pneumothorax. Skeletal structures grossly intact IMPRESSION: 1. Opacity at the left lung base, which may reflect atelectasis or pneumonia. 2. Vascular and interstitial prominence similar to the prior study without convincing pulmonary  edema. Possible small left pleural effusion. Electronically Signed   By: Lajean Manes M.D.   On: 12/13/2020 12:44    Procedures Procedures (including critical care time)  Medications Ordered in ED Medications - No data to display  ED Course  I have reviewed the triage vital signs and the nursing notes.  Pertinent labs & imaging results that were available during my care of the patient were reviewed by me and considered in my medical decision making (see chart for details).    MDM Rules/Calculators/A&P                          Elderly male with multiple medical problems presenting today after what sounds like a syncopal event but in the setting of 3 to 4 days of  worsening exertional dyspnea, dizziness with standing, generalized weakness.  This is in the setting of COVID in the end of November, rehab for a month and now home for the last 12 days.  Patient does not have significant findings of fluid overload at this time with no edema in his lower extremities and clear breath sounds.  Sats are 100% on room air and he has no evidence of respiratory distress.  Concern for possible dysrhythmia as patient did have a wide-complex tachycardia that required shock when he was admitted for COVID.  He does take digoxin and metoprolol.  Concern for dehydration and new renal failure as patient has poor oral intake.  Also concern for possible PE given recent admission inability to give anticoagulants due to prior GI bleeding, potential new anemia however lower suspicion as patient's stool today appears normal.  EKG today shows ventricular bigeminy with a left bundle branch block which is unchanged and patient is currently in atrial fibrillation.  Labs including digoxin level are pending.  Chest x-ray pending.  We will interrogate his [facility] pacemaker.  Spoke with his daughter [name] who reported that he did start taking Lasix 2 days ago and it seemed that his shortness of breath had improved and he had a pretty good day yesterday until the events of today.  1:21 PM At 12:47 PM patient suddenly went into a V. tach rhythm that lasted approximately 4 to 5 minutes.  He was able to speak but reported he did not feel well.  Blood pressure dropped into the 60s and then prior to intervention he converted back into a narrow complex rhythm and blood pressure improved to the low 100s.  Patient was bolused with amiodarone.  Pacemaker did show that he had A. fib with RVR earlier today around the time that his daughter reports he had the episode of loss of consciousness, agonal breathing and change of color.  Labs are otherwise unremarkable with a stable hemoglobin of 9, negative Hemoccult, CMP  without significant change in creatinine and normal electrolytes, troponin was 23 but improved from prior and digoxin level is subtherapeutic.  Chest x-ray showed opacity at the left lung base which may reflect atelectasis or pneumonia.  Also vascular and interstitial prominence similar to prior study without convincing pulmonary edema.  Will discuss with cardiology for further care.  1:42 PM Spoke with Dr. Debara Pickett who plans on seeing the pt. also spoke with the patient's daughter about his CODE STATUS.  Patient does wish to be shocked if his heart goes into an abnormal rhythm however if his heart were to stop completely and he was unable to be shocked and could only receive CPR he  would not want to be on a ventilator and would only want a short course of CPR and if there was no change he would want efforts to be discontinued.  MDM Number of Diagnoses or Management Options   Amount and/or Complexity of Data Reviewed Clinical lab tests: ordered and reviewed Tests in the radiology section of CPT: ordered and reviewed Tests in the medicine section of CPT: ordered and reviewed Decide to obtain previous medical records or to obtain history from someone other than the patient: yes Obtain history from someone other than the patient: yes Review and summarize past medical records: yes Discuss the patient with other providers: yes Independent visualization of images, tracings, or specimens: yes  Risk of Complications, Morbidity, and/or Mortality Presenting problems: high Diagnostic procedures: moderate Management options: high  Patient Progress Patient progress: improved   CRITICAL CARE Performed by: Janette Harvie Total critical care time: 30 minutes Critical care time was exclusive of separately billable procedures and treating other patients. Critical care was necessary to treat or prevent imminent or life-threatening deterioration. Critical care was time spent personally by me on the  following activities: development of treatment plan with patient and/or surrogate as well as nursing, discussions with consultants, evaluation of patient's response to treatment, examination of patient, obtaining history from patient or surrogate, ordering and performing treatments and interventions, ordering and review of laboratory studies, ordering and review of radiographic studies, pulse oximetry and re-evaluation of patient's condition.  Final Clinical Impression(s) / ED Diagnoses Final diagnoses:  Syncope, unspecified syncope type  V-tach [facility])    Rx / DC Orders ED Discharge Orders    None       Blanchie Dessert, MD 12/13/20 1343

## 2020-12-13 NOTE — ED Notes (Signed)
Urine sent to lab WITH a culture.

## 2020-12-13 NOTE — ED Triage Notes (Addendum)
Pt arrives from home via EMS. Pt's daughter reports seizure-like activity lasting appro 1 minute. No h of seizure. H of CHF, COPD, A-Fib.Fire department arrived prior to EMS reported shallow breathing and unable to obtain carotid pulse. FD sat pt up in bed and pt became alert. EMS reports pt GCS 14.  Pt wet from urine. Pt a/o at this time. Pt not postictal with EMS. No IV access established.

## 2020-12-13 NOTE — Consult Note (Addendum)
Cardiology Consultation:   Patient ID: [name] [name] MRN: [id number]; DOB: Feb 21, 1932  Admit date: 12/13/2020 Date of Consult: 12/13/2020  Primary Care Provider: Crist Infante, MD [facility] Cardiologist: Peter [name], MD  [facility] Electrophysiologist:  [name], MD    Patient Profile:   [name] [name] is a 87 y.o. male with a hx of heart failure who is being seen today for the evaluation of VT at the request of Blanchie Dessert.  History of Present Illness:   [name] [name] is an 87 year old male with a history of coronary artery disease status post CABG and an ischemic cardiomyopathy, permanent atrial fibrillation not anticoagulated due to GI bleeds, colon cancer status post chemo, radiation, surgery, hypertension.  He had a prior ICD, but this was switched to a pacemaker and generator change in 2016.    He was admitted 10/23/2020 with COVID-19 pneumonia and sepsis.  He had wide-complex tachycardia on admission with hypotension and was emergently cardioverted.  Echo showed an ejection fraction of 25 to 30%.  He presented to the hospital today with worsening shortness of breath, weakness, dizziness and chest pain.  His shortness of breath started today suddenly.  He had generalized shaking and unresponsiveness.  EMS arrived and he was reported to have shallow breathing and could not feel the carotid pulse.  EMS set the patient up and he started answering questions.  He was brought to the emergency room and was found to have a wide-complex tachycardia consistent with ventricular tachycardia.  He was started on amiodarone drip.  The patient states that he has edema in both his legs and mainly his left arm.  His left arm is more swollen than his right arm.  He also has shortness of breath, but no orthopnea or PND.  He states that he did pass out earlier today.  His VT started in the emergency room.  He was being prepped for cardioversion, but converted back to normal rhythm.  In review  of his device interrogation, he had ventricular tachycardia which self converted.   Past Medical History:  Diagnosis Date  . CAD (coronary artery disease)    Old inferolateral MI s/p CABG in 2002  . CAP (community acquired pneumonia) 10/03/2011  . Cardiomyopathy    s/p ICD 09/30/08; EF 35 to 40% per echo in October 2021  . Chronic systolic CHF (congestive heart failure) ([place]) 07/24/2013  . Colon cancer ([place])    s/p chemo, RT, sugery  . Colorectal anastomotic stricture   . Diverticulosis   . ED (erectile dysfunction)   . GERD (gastroesophageal reflux disease)    rarely  . HTN (hypertension)   . ICD (implantable cardiac defibrillator) discharge October 2012   removed 2016    now only PPM  . Iron deficiency anemia   . Paroxysmal atrial fibrillation (HCC)    controlled with amiodarone, not a coumadin candidate due to GI bleeding    Past Surgical History:  Procedure Laterality Date  . APPENDECTOMY    . BACK SURGERY    . BIOPSY  09/22/2018   Procedure: BIOPSY;  Surgeon: Gatha Mayer, MD;  Location: [facility] END[facility];  Service: Endoscopy;;  . CARDIAC CATHETERIZATION  11/07/2000   EF 32%  . CARDIAC DEFIBRILLATOR PLACEMENT  09/30/08   MDT dual chamber ICD placed by Dr Rayann Heman  . CARDIOVASCULAR STRESS TEST  08/06/2008   EF 30%  . CHOLECYSTECTOMY    . COLON RESECTION     x2  . COLONOSCOPY     multiple  .  COLONOSCOPY N/A 09/22/2018   Procedure: COLONOSCOPY;  Surgeon: Gatha Mayer, MD;  Location: [facility] ENDOSCOPY;  Service: Endoscopy;  Laterality: N/A;  . CORONARY ARTERY BYPASS GRAFT    . EP IMPLANTABLE DEVICE N/A 10/15/2015   Procedure:  PPM Generator Changeout;  Surgeon: [name], MD;  Location: [facility];  Service: Cardiovascular;  Laterality: N/A;  . HERNIA REPAIR    . US ECHOCARDIOGRAPHY  08/11/2008   EF 30-35%  . VASECTOMY       Home Medications:  Prior to Admission medications   Medication Sig Start Date End Date Taking? Authorizing Provider  BESIVANCE 0.6 % SUSP  Place 1 drop into the right eye 3 (three) times daily. 08/23/18   [provider]  cholecalciferol (VITAMIN D) 1000 UNITS tablet Take 1,000 Units by mouth daily.    [provider]  digoxin 62.5 MCG TABS Take 0.0625 mg by mouth every other day. 11/02/20   Thurnell Lose, MD  diphenoxylate-atropine (LOMOTIL) 2.5-0.025 MG tablet Take 1 tablet by mouth 4 (four) times daily as needed for diarrhea or loose stools.  12/27/19   [provider]  DUREZOL 0.05 % EMUL Place 1 drop into the right eye 3 (three) times daily. 08/23/18   [provider]  furosemide (LASIX) 40 MG tablet Take 1 tablet (40 mg total) by mouth daily. 12/10/20   [name], Peter M, MD  metoprolol succinate (TOPROL-XL) 100 MG 24 hr tablet TAKE 1 TABLET (100 MG TOTAL) BY MOUTH DAILY WITH OR IMMEDIATELY FOLLOWING A MEAL Patient taking differently: Take 100 mg by mouth daily. 01/08/20   Almyra Deforest, PA  Multiple Vitamins-Minerals (MULTIVITAMINS THER. W/MINERALS) TABS Take 1 tablet by mouth daily.    [provider]  nitroGLYCERIN (NITROSTAT) 0.4 MG SL tablet Place 1 tablet (0.4 mg total) under the tongue every 5 (five) minutes as needed for chest pain. 05/07/14   [name], Peter M, MD  pantoprazole (PROTONIX) 40 MG tablet Take 1 tablet (40 mg total) by mouth 2 (two) times daily. 11/02/20   Thurnell Lose, MD  PROAIR HFA 108 (90 BASE) MCG/ACT inhaler Inhale 2 puffs into the lungs every 4 (four) hours as needed for shortness of breath.  10/22/13   [provider]  ramipril (ALTACE) 2.5 MG capsule Take 1 capsule (2.5 mg total) by mouth daily. 11/02/20   Thurnell Lose, MD  spironolactone (ALDACTONE) 25 MG tablet TAKE 1/2 TABLET EVERY DAY (NEED MD APPOINTMENT) Patient taking differently: Take 12.5 mg by mouth daily. 10/12/20   Allred, Jeneen Rinks, MD  tamsulosin (FLOMAX) 0.4 MG CAPS capsule Take 1 capsule (0.4 mg total) by mouth daily. 11/02/20   Thurnell Lose, MD  vitamin B-12 (CYANOCOBALAMIN) 1000 MCG  tablet Take 1,000 mcg by mouth daily.    [provider]    Inpatient Medications: Scheduled Meds:  Continuous Infusions:  PRN Meds:   Allergies:    Allergies  Allergen Reactions  . Iron     Stomach issues  . Lescol [Fluvastatin Sodium]     Dizziness  . Pentazocine Lactate     unknown  . Vytorin [Ezetimibe-Simvastatin]     Leg cramps   . Aspirin     Bleeding     Social History:   Social History   Socioeconomic History  . Marital status: Married    Spouse name: Not on file  . Number of children: 2  . Years of education: Not on file  . Highest education level: Not on file  Occupational History  . Occupation: Retired     Fish farm manager: RETIRED  Tobacco Use  . Smoking status: Former Smoker    Types: Cigars  . Smokeless tobacco: Never Used  Vaping Use  . Vaping Use: Never used  Substance and Sexual Activity  . Alcohol use: No  . Drug use: No  . Sexual activity: Not Currently  Other Topics Concern  . Not on file  Social History Narrative   Lives in [place] with spouse.  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 Colon cancer Father   . Diabetes Sister   . Colon polyps Brother   . Pancreatic cancer Brother 30     ROS:  Please see the history of present illness.   All other ROS reviewed and negative.     Physical Exam/Data:   Vitals:   12/13/20 1226 12/13/20 1230 12/13/20 1250 12/13/20 1330  BP: 106/74 112/70 104/69 106/63  Pulse: 83 (!) 36 (!) 42 (!) 37  Resp: (!) 22 (!) 26 18 10   Temp: 98 F (36.7 C)     TempSrc: Rectal      SpO2: 100% 100% 92% 90%   No intake or output data in the 24 hours ending 12/13/20 1346 Last 3 Weights 12/10/2020 10/23/2020 06/15/2020  Weight (lbs) 130 lb 136 lb 11 oz 137 lb  Weight (kg) 58.968 kg 62 kg 62.143 kg     There is no height or weight on file to calculate BMI.  General:  Well nourished, well developed, in no acute distress HEENT: normal Lymph: no adenopathy Neck: no JVD Endocrine:  No thryomegaly Vascular: No carotid bruits; FA pulses 2+ bilaterally without bruits  Cardiac:  normal S1, S2; RRR; no murmur  Lungs: Faint crackles at the bases Abd: soft, nontender, no hepatomegaly  Ext: 2+ edema Musculoskeletal:  No deformities, BUE and BLE strength normal and equal Skin: warm and dry  Neuro:  CNs 2-12 intact, no focal abnormalities noted Psych:  Normal affect   EKG:  The EKG was personally reviewed and demonstrates:  VT, rate 182 Telemetry:  Telemetry was personally reviewed and demonstrates: Atrial fibrillation with short run of ventricular tachycardia  Relevant CV Studies: TTE 10/25/20 1. Left ventricular ejection fraction, by estimation, is 25 to 30%. The  left ventricle has severely decreased function. The left ventricle  demonstrates global hypokinesis. Left ventricular diastolic function could  not be evaluated. There is severe  akinesis of the left ventricular, basal-mid inferior and inferolateral  walls. Incoordinate (paced) septal motion.  2. Right ventricular systolic function is moderately reduced. The right  ventricular size is mildly enlarged. There is severely elevated pulmonary  artery systolic pressure. The estimated right ventricular systolic  pressure is 42.7 mmHg.  3. Left atrial size was massively dilated.  4. Right atrial size was moderately dilated.  5. Mild mitral subvalvular calcification.  6. Mild mitral subvalvular thickening/fibrosis.  7. Tethering of the mitral valve posterior leaflet.  8. The mitral valve is  abnormal. Severe  mitral valve regurgitation.  9. The tricuspid valve is abnormal. Tricuspid valve regurgitation is  moderate.  10. The aortic valve is tricuspid. Aortic valve regurgitation is mild.  Mild aortic valve sclerosis is present, with no evidence of aortic valve  stenosis.  11. Moderately dilated pulmonary artery.  12. The inferior vena cava is dilated in size with <50% respiratory  variability, suggesting right atrial pressure of 15 mmHg.   Laboratory Data:  High Sensitivity Troponin:   Recent Labs  Lab 12/13/20 1223  TROPONINIHS 23*     Chemistry Recent Labs  Lab 12/09/20 1527 12/13/20 1223  NA 140 139  K 3.6 3.5  CL 109 102  CO2 20* 24  GLUCOSE 130* 175*  BUN 26* 20  CREATININE 1.45* 1.57*  CALCIUM 8.7* 8.3*  GFRNONAA 47* 43*  ANIONGAP 11 13    Recent Labs  Lab 12/13/20 1223  PROT 5.6*  ALBUMIN 3.0*  AST 37  ALT 18  ALKPHOS 51  BILITOT 1.9*   Hematology Recent Labs  Lab 12/09/20 1527 12/13/20 1223  WBC 8.9 10.2  RBC 3.07* 3.19*  HGB 9.1* 9.3*  HCT 28.9* 30.2*  MCV 94.1 94.7  MCH 29.6 29.2  MCHC 31.5 30.8  RDW 15.0 15.2  PLT 279 284   BNP Recent Labs  Lab 12/13/20 1228  BNP 436.0*    DDimer No results for input(s): DDIMER in the last 168 hours.   Radiology/Studies:  DG Chest 2 View  Result Date: 12/11/2020 CLINICAL DATA:  Shortness of breath, prior COVID EXAM: CHEST - 2 VIEW COMPARISON:  10/24/2020 FINDINGS: Persistent interstitial prominence with patchy density particularly at the right lung base. No significant pleural effusion. No pneumothorax. Top-normal heart size. Left chest wall ICD is present. No acute osseous abnormality. IMPRESSION: Persistent interstitial prominence and patchy density particularly at the right lung base. May reflect edema and/or scarring from prior COVID pneumonia. Electronically Signed   By: Macy Mis M.D.   On: 12/11/2020 13:34   DG Chest Port 1 View  Result Date: 12/13/2020 CLINICAL DATA:  . Pt's daughter  reports seizure-like activity lasting appro 1 minute. No h of seizure. H of CHF, COPD, A-Fib.Fire department arrived prior to EMS reported shallow breathing and unable to obtain carotid pulse EXAM: PORTABLE CHEST 1 VIEW COMPARISON:  12/11/2020 FINDINGS: Stable changes from prior CABG surgery. Cardiac silhouette is mildly enlarged. Mediastinal or hilar masses. Left anterior chest wall AICD is stable. Prominent interstitial and vascular markings bilaterally. There is additional opacity at the left lung base, which appears more prominent than on the recent prior study. This may reflect atelectasis or infiltrate. Possible small left pleural effusion. No evidence of a right pleural effusion. No pneumothorax. Skeletal structures grossly intact IMPRESSION: 1. Opacity at the left lung base, which may reflect atelectasis or pneumonia. 2. Vascular and interstitial prominence similar to the prior study without convincing pulmonary edema. Possible small left pleural effusion. Electronically Signed   By: Lajean Manes M.D.   On: 12/13/2020 12:44     Assessment and Plan:   1. Ventricular tachycardia: Patient had an episode of syncope and shortness of breath while at home.  Device interrogation shows ventricular tachycardia this morning.  He again had ventricular tachycardia while in the emergency room but fortunately converted to atrial fibrillation on both instances.  It is certainly likely that his VT is causing most of his symptoms, though his shortness of breath is also likely due to heart failure exacerbation.  Troponin initially  was normal.  We will continue to monitor and if there is a drastic change, plan for left heart catheterization.  His BNP was elevated and thus he will need diuresis.  Amiodarone drip has been started.  We will continue to load amiodarone.  He will need a 24-hour IV load followed by 400 mg twice daily for 2 weeks, then 200 mg a day. 2. Chronic systolic heart failure due to ischemic  cardiomyopathy: Patient has evidence of volume overload with an elevated BNP.  He does need continued diuresis.  We will continue his beta-blocker but hold his other heart failure medications as he is being diuresed.   3. Coronary artery disease: Currently no ischemic symptoms.  Initial high-sensitivity troponin has not been elevated.  We will continue to trend.   For questions or updates, please contact [place] Please consult [email or web address] for contact info under    Signed, Will Meredith Leeds, MD  12/13/2020 1:46 PM

## 2020-12-13 NOTE — ED Notes (Signed)
Amiodarone drip initiated at this time, Dose 150 mg in 200 ml, infusing at 333 ml/hr.

## 2020-12-14 ENCOUNTER — Encounter (HOSPITAL_COMMUNITY): Payer: Self-pay | Admitting: Cardiology

## 2020-12-14 ENCOUNTER — Other Ambulatory Visit: Payer: Self-pay

## 2020-12-14 DIAGNOSIS — I255 Ischemic cardiomyopathy: Secondary | ICD-10-CM | POA: Diagnosis not present

## 2020-12-14 DIAGNOSIS — I472 Ventricular tachycardia: Secondary | ICD-10-CM

## 2020-12-14 DIAGNOSIS — I2510 Atherosclerotic heart disease of native coronary artery without angina pectoris: Secondary | ICD-10-CM

## 2020-12-14 DIAGNOSIS — I4821 Permanent atrial fibrillation: Secondary | ICD-10-CM | POA: Diagnosis not present

## 2020-12-14 HISTORY — DX: Ventricular tachycardia: I47.2

## 2020-12-14 LAB — BASIC METABOLIC PANEL
Anion gap: 13 (ref 5–15)
BUN: 15 mg/dL (ref 8–23)
CO2: 27 mmol/L (ref 22–32)
Calcium: 8.7 mg/dL — ABNORMAL LOW (ref 8.9–10.3)
Chloride: 101 mmol/L (ref 98–111)
Creatinine, Ser: 1.4 mg/dL — ABNORMAL HIGH (ref 0.61–1.24)
GFR, Estimated: 49 mL/min — ABNORMAL LOW (ref >60)
Glucose, Bld: 109 mg/dL — ABNORMAL HIGH (ref 70–99)
Potassium: 3.3 mmol/L — ABNORMAL LOW (ref 3.5–5.1)
Sodium: 141 mmol/L (ref 135–145)

## 2020-12-14 LAB — MAGNESIUM: Magnesium: 1.4 mg/dL — ABNORMAL LOW (ref 1.7–2.4)

## 2020-12-14 LAB — DIGOXIN LEVEL: Digoxin Level: 0.4 ng/mL — ABNORMAL LOW (ref 0.8–2.0)

## 2020-12-14 MED ORDER — MAGNESIUM SULFATE IN D5W 1-5 GM/100ML-% IV SOLN
1.0000 g | Freq: Once | INTRAVENOUS | Status: AC
  Administered 2020-12-14: 1 g via INTRAVENOUS
  Filled 2020-12-14: qty 100

## 2020-12-14 MED ORDER — LIDOCAINE IN D5W 4-5 MG/ML-% IV SOLN
0.5000 mg/min | INTRAVENOUS | Status: DC
  Administered 2020-12-14: 0.5 mg/min via INTRAVENOUS
  Filled 2020-12-14: qty 500

## 2020-12-14 MED ORDER — POTASSIUM CHLORIDE CRYS ER 20 MEQ PO TBCR
40.0000 meq | EXTENDED_RELEASE_TABLET | ORAL | Status: AC
  Administered 2020-12-14 (×2): 40 meq via ORAL
  Filled 2020-12-14 (×2): qty 2

## 2020-12-14 MED ORDER — LIDOCAINE BOLUS VIA INFUSION
50.0000 mg | Freq: Once | INTRAVENOUS | Status: AC
  Administered 2020-12-14: 50 mg via INTRAVENOUS
  Filled 2020-12-14: qty 52

## 2020-12-14 NOTE — ED Notes (Signed)
6E called for report, unavailable at this time. Call back number given.

## 2020-12-14 NOTE — Progress Notes (Addendum)
Progress Note  Patient Name: [name] [name] Date of Encounter: 12/14/2020  [facility] Cardiologist: Peter [name], MD   Subjective   I feel pretty good now, no ongoing SOB, no CP  Inpatient Medications    Scheduled Meds:  cholecalciferol  1,000 Units Oral Daily   Difluprednate  1 drop Right Eye TID   digoxin  0.0625 mg Oral QODAY   enoxaparin (LOVENOX) injection  30 mg Subcutaneous Daily   furosemide  40 mg Intravenous BID   gatifloxacin  1 drop Right Eye TID AC & HS   metoprolol succinate  100 mg Oral Daily   multivitamin with minerals  1 tablet Oral Daily   pantoprazole  40 mg Oral BID   potassium chloride  40 mEq Oral Q3H   sodium chloride flush  3 mL Intravenous Q12H   tamsulosin  0.4 mg Oral Daily   vitamin B-12  1,000 mcg Oral Daily   Continuous Infusions:  sodium chloride     amiodarone 30 mg/hr (12/14/20 0657)   lidocaine 0.5 mg/min (12/14/20 0519)   magnesium sulfate bolus IVPB     PRN Meds: sodium chloride, acetaminophen, albuterol, diphenoxylate-atropine, nitroGLYCERIN, ondansetron (ZOFRAN) IV, sodium chloride flush   Vital Signs    Vitals:   12/14/20 0530 12/14/20 0600 12/14/20 0630 12/14/20 0715  BP: 129/78 117/88 112/71 (!) 115/91  Pulse: (!) 113 89 73 (!) 125  Resp: 16 13 15 14   Temp:      TempSrc:      SpO2: 90% 100% 100% 100%    Intake/Output Summary (Last 24 hours) at 12/14/2020 0742 Last data filed at 12/14/2020 1517 Gross per 24 hour  Intake --  Output 750 ml  Net -750 ml   Last 3 Weights 12/10/2020 10/23/2020 06/15/2020  Weight (lbs) 130 lb 136 lb 11 oz 137 lb  Weight (kg) 58.968 kg 62 kg 62.143 kg      Telemetry    AFib 70's-90's  No VT in a few hours, this AMs rates were a little slower then his other with rates 170's - Personally Reviewed  ECG    No new EKGs - Personally Reviewed  Physical Exam   GEN: No acute distress, elderly .   Neck: No JVD Cardiac: irreg-irreg, no murmurs, rubs, or gallops.  Respiratory: CTA  b/l. GI: Soft, nontender, non-distended  MS: trace-1+ edema; No deformity, thin, advanced atrophy Neuro:  Nonfocal  Psych: Normal affect   Labs    High Sensitivity Troponin:   Recent Labs  Lab 12/13/20 1223 12/13/20 1424  TROPONINIHS 23* 25*      Chemistry Recent Labs  Lab 12/09/20 1527 12/13/20 1223 12/14/20 0429  NA 140 139 141  K 3.6 3.5 3.3*  CL 109 102 101  CO2 20* 24 27  GLUCOSE 130* 175* 109*  BUN 26* 20 15  CREATININE 1.45* 1.57* 1.40*  CALCIUM 8.7* 8.3* 8.7*  PROT  --  5.6*  --   ALBUMIN  --  3.0*  --   AST  --  37  --   ALT  --  18  --   ALKPHOS  --  51  --   BILITOT  --  1.9*  --   GFRNONAA 47* 43* 49*  ANIONGAP 11 13 13      Hematology Recent Labs  Lab 12/09/20 1527 12/13/20 1223 12/14/20 0429  WBC 8.9 10.2 9.4  RBC 3.07* 3.19* 3.38*  HGB 9.1* 9.3* 9.7*  HCT 28.9* 30.2* 31.6*  MCV 94.1 94.7 93.5  MCH 29.6 29.2 28.7  MCHC 31.5 30.8 30.7  RDW 15.0 15.2 15.2  PLT 279 284 268    BNP Recent Labs  Lab 12/13/20 1228  BNP 436.0*     DDimer No results for input(s): DDIMER in the last 168 hours.   Radiology    DG Chest Port 1 View Result Date: 12/13/2020 CLINICAL DATA:  . Pt's daughter reports seizure-like activity lasting appro 1 minute. No h of seizure. H of CHF, COPD, A-Fib.Fire department arrived prior to EMS reported shallow breathing and unable to obtain carotid pulse EXAM: PORTABLE CHEST 1 VIEW COMPARISON:  12/11/2020 FINDINGS: Stable changes from prior CABG surgery. Cardiac silhouette is mildly enlarged. Mediastinal or hilar masses. Left anterior chest wall AICD is stable. Prominent interstitial and vascular markings bilaterally. There is additional opacity at the left lung base, which appears more prominent than on the recent prior study. This may reflect atelectasis or infiltrate. Possible small left pleural effusion. No evidence of a right pleural effusion. No pneumothorax. Skeletal structures grossly intact IMPRESSION: 1. Opacity at  the left lung base, which may reflect atelectasis or pneumonia. 2. Vascular and interstitial prominence similar to the prior study without convincing pulmonary edema. Possible small left pleural effusion. Electronically Signed   By: Lajean Manes M.D.   On: 12/13/2020 12:44    Cardiac Studies   10/25/20: TTE IMPRESSIONS   1. Left ventricular ejection fraction, by estimation, is 25 to 30%. The  left ventricle has severely decreased function. The left ventricle  demonstrates global hypokinesis. Left ventricular diastolic function could  not be evaluated. There is severe  akinesis of the left ventricular, basal-mid inferior and inferolateral  walls. Incoordinate (paced) septal motion.   2. Right ventricular systolic function is moderately reduced. The right  ventricular size is mildly enlarged. There is severely elevated pulmonary  artery systolic pressure. The estimated right ventricular systolic  pressure is 25.9 mmHg.   3. Left atrial size was massively dilated.   4. Right atrial size was moderately dilated.   5. Mild mitral subvalvular calcification.   6. Mild mitral subvalvular thickening/fibrosis.   7. Tethering of the mitral valve posterior leaflet.   8. The mitral valve is abnormal. Severe mitral valve regurgitation.   9. The tricuspid valve is abnormal. Tricuspid valve regurgitation is  moderate.  10. The aortic valve is tricuspid. Aortic valve regurgitation is mild.  Mild aortic valve sclerosis is present, with no evidence of aortic valve  stenosis.  11. Moderately dilated pulmonary artery.  12. The inferior vena cava is dilated in size with <50% respiratory  variability, suggesting right atrial pressure of 15 mmHg.   Patient Profile     87 y.o. male CAD ( MI > CABG 2002), Afib (not on a/c 2/2 GIB characterized as "major", by cardiology note and recurrent), ICM, Chronic CHF (systolic), ICD >> PPM, HTN, HLD.   note that in 2016 when his ICD was nearing ERI, was discussed  given no V arrhythmias, an in appropriate therapy for AF only, LVEF improved to 40%,  and advanced age, though did have 11% V pacing, decided to replace his device with PPM.   The patient was hospitalized Nov 2021 with fever, malaise, GIB, COVID, sepsis, cardiology during that admission for tachycardia and WCT felt to be VT and was cardioverted in the ER.  Managed wit IV amiodarone.  TTE during that admission noted reduction in his EF, was started on dig as well and dilt > eventually amio stopped, discharged 11/04/20 on  BB and dig for rate control as well as ACE, lasix, aldactone.  He saw Dr. [name] via tele health 12/10/20, he had some AMS and marked DOE, discussed perhaps residual from COVID infection back in Nov, volume OL , planned for CXR and resumption of his lasix (that he had not taken for 5 days).  He was admitted yesterday after syncopal event at home, EMS was called, lethargic, though improved in their prsence, in the ER developed VT while being prepped for cardioversion had spontaneous conversion. Started on amidarone. He was seen/admitted by EP, Dr. Curt Bears, started on amiodarone gtt, planned for diuresis, n o CP and no significant HS trop, not felt to be ischemic  Early this AM with recurrent sustained VT > lidocaine gtt started   Device information SJM dual chamber PPM generator 09/2015 Leads are MDT RV lead is a 6947 (both RA and RV are from 2009)    Assessment & Plan    1. VT     Continue amiodarone gtt      Continue lidocaine gtt for today     Replace K+ and check mag     Keep K+ 4.0 or better and mag 2.0 or better     Lido level in the AM  2. Acute on chronic HF 3. ICM     No active SOB currently     Unknown I/O though pt reports good urine OP     Continue IV diuresis today, remains with some edema     Creat stable       4. CAD     No CP now or at home     HS Trop  Low and flat     Not felt to be actively ischemic     Pt would like to avoid invasive  procedures/cath if able     No recent ischemic evaluation      5. Permanent AFib     Not an a/c candidate 2/2 GIBs     Rate generally ok 70's-90's     Continue dig and BB       For questions or updates, please contact [place] Please consult [email or web address] for contact info under     Signed, Baldwin Jamaica, PA-C  12/14/2020, 7:42 AM    EP Attending  Patient seen and examined. Agree with the findings as noted above. The paitent has had recurrent VT now years after downgrade from ICD to PPM. He has multiple comorbidities but in light of these, I think it is most reasonable to continue amiodarone and for now lidocaine.  These can be weaned in 24-48 hours and then he can be started on oral amio. His advanced age and multiple comrobidities make him a poor candidate for ICD insertion. His long term prognosis is very poor with or without an ICD. Finally I would not recommend left heart cath as the likelihood of finding a culprit lesion that would reduce his VT and prolong his life is very low and he would be at significant risk for complications.  Royston Sinner Iliany Losier,MD

## 2020-12-14 NOTE — ED Notes (Signed)
Pt noted to be in [facility] for appro 2 minutes. Pulses present, pt alert and orientated during event. Denies any pain. MD paged.

## 2020-12-14 NOTE — ED Notes (Signed)
Breakfast Ordered 

## 2020-12-14 NOTE — ED Notes (Signed)
Pt noted to be in [place] for approx 4 minutes. Pt alert and oriented during event - denies any pain or symptoms. MD paged. ED provider at bedside.

## 2020-12-15 DIAGNOSIS — I472 Ventricular tachycardia: Secondary | ICD-10-CM | POA: Diagnosis not present

## 2020-12-15 DIAGNOSIS — I5023 Acute on chronic systolic (congestive) heart failure: Secondary | ICD-10-CM | POA: Diagnosis not present

## 2020-12-15 LAB — LIDOCAINE LEVEL: Lidocaine Lvl: 3 ug/mL (ref 1.5–5.0)

## 2020-12-15 LAB — BASIC METABOLIC PANEL
Anion gap: 13 (ref 5–15)
BUN: 23 mg/dL (ref 8–23)
CO2: 25 mmol/L (ref 22–32)
Calcium: 8.7 mg/dL — ABNORMAL LOW (ref 8.9–10.3)
Chloride: 99 mmol/L (ref 98–111)
Creatinine, Ser: 1.54 mg/dL — ABNORMAL HIGH (ref 0.61–1.24)
GFR, Estimated: 44 mL/min — ABNORMAL LOW (ref >60)
Glucose, Bld: 111 mg/dL — ABNORMAL HIGH (ref 70–99)
Potassium: 4.1 mmol/L (ref 3.5–5.1)
Sodium: 137 mmol/L (ref 135–145)

## 2020-12-15 LAB — MAGNESIUM: Magnesium: 1.9 mg/dL (ref 1.7–2.4)

## 2020-12-15 MED ORDER — AMIODARONE HCL 200 MG PO TABS
400.0000 mg | ORAL_TABLET | Freq: Two times a day (BID) | ORAL | Status: DC
  Administered 2020-12-15: 400 mg via ORAL
  Filled 2020-12-15: qty 2

## 2020-12-15 MED ORDER — FUROSEMIDE 40 MG PO TABS
40.0000 mg | ORAL_TABLET | Freq: Every day | ORAL | Status: DC
  Administered 2020-12-16 – 2020-12-17 (×2): 40 mg via ORAL
  Filled 2020-12-15 (×2): qty 1

## 2020-12-15 MED ORDER — AMIODARONE HCL IN DEXTROSE 360-4.14 MG/200ML-% IV SOLN
30.0000 mg/h | INTRAVENOUS | Status: DC
  Administered 2020-12-15 – 2020-12-16 (×2): 30 mg/h via INTRAVENOUS
  Filled 2020-12-15 (×2): qty 200

## 2020-12-15 MED ORDER — AMIODARONE HCL IN DEXTROSE 360-4.14 MG/200ML-% IV SOLN
60.0000 mg/h | INTRAVENOUS | Status: DC
  Administered 2020-12-15 (×2): 60 mg/h via INTRAVENOUS
  Filled 2020-12-15: qty 200

## 2020-12-15 MED ORDER — AMIODARONE LOAD VIA INFUSION
150.0000 mg | Freq: Once | INTRAVENOUS | Status: AC
  Administered 2020-12-15: 150 mg via INTRAVENOUS
  Filled 2020-12-15: qty 83.34

## 2020-12-15 MED ORDER — MAGNESIUM SULFATE IN D5W 1-5 GM/100ML-% IV SOLN
1.0000 g | Freq: Once | INTRAVENOUS | Status: AC
  Administered 2020-12-15: 1 g via INTRAVENOUS
  Filled 2020-12-15: qty 100

## 2020-12-15 NOTE — Progress Notes (Signed)
Amiodarone started, bolus being given

## 2020-12-15 NOTE — TOC Initial Note (Signed)
Transition of Care [facility]) - Initial/Assessment Note    Patient Details  Name: [name] [name] MRN: [id number] Date of Birth: [birth year]  Transition of Care [facility]) CM/SW Contact:    Trula Ore, [place] Phone Number: 12/15/2020, 4:03 PM  Clinical Narrative:                  CSW received consult for possible SNF placement at time of discharge. CSW spoke with patients daughter [name] regarding PT recommendation of SNF placement at time of discharge. Patient comes from home alone.Patients daughter expressed understanding of PT recommendation and is agreeable to SNF placement at time of discharge. Patients daughter gave CSW permission to fax out initial referral near [place] area.  Patient has received the COVID vaccines.No further questions reported at this time. CSW to continue to follow and assist with discharge planning needs.   Expected Discharge Plan: Skilled Nursing Facility Barriers to Discharge: Continued Medical Work up   Patient Goals and CMS Choice   CMS Medicare.gov Compare Post Acute Care list provided to:: Patient Represenative (must comment) (patients daughter [name]) Choice offered to / list presented to : [facility] engineer)  Expected Discharge Plan and Services Expected Discharge Plan: [place] arrangements for the past 2 months: Single Family Home                                      Prior Living Arrangements/Services Living arrangements for the past 2 months: Single Family Home Lives with:: Self Patient language and need for interpreter reviewed:: Yes Do you feel safe going back to the place where you live?: No   SNF  Need for Family Participation in Patient Care: Yes (Comment) Care giver support system in place?: Yes (comment)   Criminal Activity/Legal Involvement Pertinent to Current Situation/Hospitalization: No - Comment as needed  Activities of Daily Living Home Assistive Devices/Equipment: Cane (specify quad or  straight),Walker (specify type) ADL Screening (condition at time of admission) Patient's cognitive ability adequate to safely complete daily activities?: No Is the patient deaf or have difficulty hearing?: Yes Does the patient have difficulty seeing, even when wearing glasses/contacts?: No Does the patient have difficulty concentrating, remembering, or making decisions?: Yes Patient able to express need for assistance with ADLs?: Yes Does the patient have difficulty dressing or bathing?: No Independently performs ADLs?: Yes (appropriate for developmental age) Does the patient have difficulty walking or climbing stairs?: Yes Weakness of Legs: Both Weakness of Arms/Hands: None  Permission Sought/Granted Permission sought to share information with : Case Manager,Family Chief Financial Officer Permission granted to share information with : Yes, Verbal Permission Granted  Share Information with NAME: [name]  Permission granted to share info w AGENCY: SNF  Permission granted to share info w Relationship: daughter  Permission granted to share info w Contact Information: [name] [phone]  Emotional Assessment       Orientation: : Oriented to Self Alcohol / Substance Use: Not Applicable Psych Involvement: No (comment)  Admission diagnosis:  V-tach ([place]) [I47.2] Syncope, unspecified syncope type [R55] Acute on chronic HFrEF (heart failure with reduced ejection fraction) ([place]) [I50.23] Patient Active Problem List   Diagnosis Date Noted  . Acute on chronic HFrEF (heart failure with reduced ejection fraction) ([place]) 12/13/2020  . Pressure injury of skin 11/04/2020  . Macrocytic anemia 10/23/2020  . Hypoalbuminemia 10/23/2020  . COVID-19   . Sepsis ([place])   .  Wide-complex tachycardia ([place])   . GI bleed 09/23/2018  . Ulceration of intestine   . Lower GI bleed 07/01/2018  . ICD in place- MDT 2009 07/03/2014  . History of GI bleed-  07/03/2014  . Essential hypertension  07/03/2014  . Acute on chronic systolic (congestive) heart failure ([place]) 10/15/2013  . Dizziness 10/14/2013  . Chronic systolic CHF (congestive heart failure) ([place]) 07/24/2013  . Ischemic cardiomyopathy 10/14/2011  . Automatic implantable cardioverter/defibrillator (AICD) activation 10/02/2011  . A-fib ([place]) 03/21/2011  . CAD (coronary artery disease) 03/21/2011   PCP:  Crist Infante, MD Pharmacy:   [place], [address] [place] [address] Phone: [phone] Fax: [phone]  [facility] [phone] [place], [place] - [place] [address] [place] [address] Phone: [phone] Fax: [phone]     Social Determinants of Health (SDOH) Interventions    Readmission Risk Interventions No flowsheet data found.

## 2020-12-15 NOTE — Progress Notes (Signed)
Made aware of increasing ectopy burden and frequent NSVT episodes. Telemetry reviewed with Dr. Curt Bears, some appear irregular and perhaps are aberrancy though can not r/o NSVT episodes will rebolus and restart his amio gtt  Pt is not symptomatic, a bit anxious/restless. He denies any CP, no SOB  Tommye Standard, PA-C

## 2020-12-15 NOTE — Evaluation (Signed)
Physical Therapy Evaluation Patient Details Name: [name] [name] MRN: [id number] DOB: 1932/05/21 Today's Date: 12/15/2020   History of Present Illness  Patient is a 87 y/o male who presents with seizure like activity, SOB, dizziness, CP and weakness. Found to have episodes of ventricular tachycardia in the ED and spontaneously converted prior to cardioversion. PMH includes CAD s/p CABG 2002, s/p PPM, ischemic cardiomyopathy, systolic CHF, HTN, PAF not on anticoagulation due to GI bleed, back surgery, colon ca, COVID -19.  Clinical Impression  Patient presents with generalized weakness, impaired cognition, impaired balance and impaired mobility s/p above. Pt reports living home alone with his dog "Oakwood," and doing his own ADLs PTA. Has a friend that helps with driving, groceries and meals per report. Pt endorses 5-6 falls recently. Reports using a walking stick for support, and occasionally using RW but is a limited ambulator at baseline. Today, pt requires mod A for bed mobility and Min A for standing and SPT to chair. Pt restless and fidgeting with all lines/gown/sheets/leads. Pt is a high fall risk. Would benefit from SNF to maximize independence and mobility prior to return home. Will follow acutely.    Follow Up Recommendations SNF;Supervision for mobility/OOB    Equipment Recommendations  None recommended by PT    Recommendations for Other Services OT consult     Precautions / Restrictions Precautions Precautions: Fall Restrictions Weight Bearing Restrictions: No      Mobility  Bed Mobility Overal bed mobility: Needs Assistance Bed Mobility: Supine to Sit     Supine to sit: Mod assist;HOB elevated     General bed mobility comments: Cues for sequencing to get to EOB, assist with trunk. Reaching for rail for support.    Transfers Overall transfer level: Needs assistance Equipment used: None Transfers: Sit to/from [facility] Sit to Stand: Min  assist Stand pivot transfers: Min assist       General transfer comment: ASsist to power to standing from EOB with pt reaching for arm rest on chair; able to perform SPT to chair with Min A for balance/lines management. Not able to stand fully upright.  Ambulation/Gait             General Gait Details: Unable today  Stairs            Wheelchair Mobility    Modified Rankin (Stroke Patients Only)       Balance Overall balance assessment: Needs assistance Sitting-balance support: Feet supported;Single extremity supported Sitting balance-Leahy Scale: Poor Sitting balance - Comments: Requires UE support for sitting EOB; LOB on a few occasions with Min A to maintain, restless and fidgeting with lines.   Standing balance support: During functional activity Standing balance-Leahy Scale: Poor Standing balance comment: Requires external support for standing/transfer                             Pertinent Vitals/Pain Pain Assessment: Faces Faces Pain Scale: No hurt    Home Living Family/patient expects to be discharged to:: Private residence Living Arrangements: Alone Available Help at Discharge: Family;Available PRN/intermittently Type of Home: Apartment Home Access: Level entry     Home Layout: One level Home Equipment: Cane - single point;Walker - 2 wheels;Other (comment);Walker - 4 wheels Additional Comments: walking stick.    Prior Function Level of Independence: Needs assistance   Gait / Transfers Assistance Needed: Reports using walking stick mostly and RW sometimes to ambulate. Reports minimal ambulation in apt. Friend  helps  with transportation and meals. Reports 5-6 falls recently.  ADL's / Homemaking Assistance Needed: family provides groceries; stands at sink to bathe  Comments: all history per pt with ?accuracy     Hand Dominance   Dominant Hand: Right    Extremity/Trunk Assessment   Upper Extremity Assessment Upper Extremity  Assessment: Defer to OT evaluation    Lower Extremity Assessment Lower Extremity Assessment: Generalized weakness    Cervical / Trunk Assessment Cervical / Trunk Assessment: Kyphotic  Communication   Communication: HOH  Cognition Arousal/Alertness: Awake/alert Behavior During Therapy: Restless Overall Cognitive Status: Impaired/Different from baseline Area of Impairment: Orientation;Memory;Attention;Following commands;Safety/judgement;Problem solving                 Orientation Level: Disoriented to;Place;Situation Current Attention Level: Sustained Memory: Decreased short-term memory Following Commands: Follows one step commands with increased time;Follows multi-step commands inconsistently Safety/Judgement: Decreased awareness of safety;Decreased awareness of deficits   Problem Solving: Difficulty sequencing;Requires verbal cues;Requires tactile cues General Comments: "i dont know where i am" Restless and fidgeting with lines/sheets/cords etc. Confused. Not sure what is baseline.      General Comments General comments (skin integrity, edema, etc.): VSS on RA during session, difficulty getting good 02 reading at end of session so donned 02, 1L    Exercises     Assessment/Plan    PT Assessment Patient needs continued PT services  PT Problem List Decreased strength;Decreased mobility;Decreased safety awareness;Decreased cognition;Decreased balance;Decreased knowledge of use of DME       PT Treatment Interventions Therapeutic exercise;Gait training;Balance training;DME instruction;Functional mobility training;Therapeutic activities;Patient/family education;Cognitive remediation    PT Goals (Current goals can be found in the Care Plan section)  Acute Rehab PT Goals Patient Stated Goal: to get up PT Goal Formulation: With patient Time For Goal Achievement: 12/29/20 Potential to Achieve Goals: Fair    Frequency Min 2X/week   Barriers to discharge Decreased  caregiver support lives alone    Co-evaluation               AM-PAC PT "6 Clicks" Mobility  Outcome Measure Help needed turning from your back to your side while in a flat bed without using bedrails?: A Little Help needed moving from lying on your back to sitting on the side of a flat bed without using bedrails?: A Lot Help needed moving to and from a bed to a chair (including a wheelchair)?: A Little Help needed standing up from a chair using your arms (e.g., wheelchair or bedside chair)?: A Little Help needed to walk in hospital room?: A Little Help needed climbing 3-5 steps with a railing? : Total 6 Click Score: 15    End of Session Equipment Utilized During Treatment: Oxygen;Gait belt Activity Tolerance: Patient limited by fatigue;Patient tolerated treatment well Patient left: in chair;with call bell/phone within reach;with chair alarm set Nurse Communication: Mobility status PT Visit Diagnosis: Unsteadiness on feet (R26.81);Other abnormalities of gait and mobility (R26.89);Muscle weakness (generalized) (M62.81)    Time: 4166-0630 PT Time Calculation (min) (ACUTE ONLY): 19 min   Charges:   PT Evaluation $PT Eval Moderate Complexity: 1 Mod          Marisa Severin, PT, DPT Acute Rehabilitation Services Pager [phone] Office [phone]      Marguarite Arbour A Sabra Heck 12/15/2020, 12:27 PM

## 2020-12-15 NOTE — NC FL2 (Signed)
[place] MEDICAID FL2 LEVEL OF CARE SCREENING TOOL     IDENTIFICATION  Patient Name: [name] [name] Birthdate: 1932/08/17 Sex: male Admission Date (Current Location): 12/13/2020  [facility] and [place] Number:  Herbalist and Address:  [facility], [place] [address] [place], [place] [address]      Provider Number: [id number]  Attending Physician Name and Address:  Constance Haw, MD  Relative Name and Phone Number:  [name] [phone]    Current Level of Care: Hospital Recommended Level of Care: [place] Prior Approval Number:    Date Approved/Denied:   PASRR Number: [id number]  Discharge Plan: SNF    Current Diagnoses: Patient Active Problem List   Diagnosis Date Noted  . Acute on chronic HFrEF (heart failure with reduced ejection fraction) ([place]) 12/13/2020  . Pressure injury of skin 11/04/2020  . Macrocytic anemia 10/23/2020  . Hypoalbuminemia 10/23/2020  . COVID-19   . Sepsis ([place])   . Wide-complex tachycardia ([place])   . GI bleed 09/23/2018  . Ulceration of intestine   . Lower GI bleed 07/01/2018  . ICD in place- MDT 2009 07/03/2014  . History of GI bleed-  07/03/2014  . Essential hypertension 07/03/2014  . Acute on chronic systolic (congestive) heart failure ([place]) 10/15/2013  . Dizziness 10/14/2013  . Chronic systolic CHF (congestive heart failure) ([place]) 07/24/2013  . Ischemic cardiomyopathy 10/14/2011  . Automatic implantable cardioverter/defibrillator (AICD) activation 10/02/2011  . A-fib ([place]) 03/21/2011  . CAD (coronary artery disease) 03/21/2011    Orientation RESPIRATION BLADDER Height & Weight     Self  O2 (Nasal Cannula 2 liters) Incontinent,External catheter (External Urinary Catheter) Weight: 135 lb 9.3 oz (61.5 kg) Height:  5\' 11"  (180.3 cm)  BEHAVIORAL SYMPTOMS/MOOD NEUROLOGICAL BOWEL NUTRITION STATUS      Continent (WDL) Diet (See Discharge Summary)  AMBULATORY STATUS COMMUNICATION OF  NEEDS Skin   Limited Assist Verbally Skin abrasions,Other (Comment) (Abrasion Arm,Leg Bilateral, Ecchymosis Arm Leg Bilateral)                       Personal Care Assistance Level of Assistance  Bathing,Feeding,Dressing Bathing Assistance: Limited assistance Feeding assistance: Independent (able to feed self;Cardiac) Dressing Assistance: Limited assistance     Functional Limitations Info  Sight,Hearing,Speech Sight Info: Impaired Hearing Info: Adequate Speech Info: Adequate    SPECIAL CARE FACTORS FREQUENCY  PT (By licensed PT),OT (By licensed OT)     PT Frequency: 5x min weekly OT Frequency: 5x min weekly            Contractures Contractures Info: Not present    Additional Factors Info  Code Status,Allergies Code Status Info: FULL Allergies Info: Iron,Lescol (fluvastatin Sodium),Pentazocine Lactate,Vytorin (ezetimibe-simvastatin),Aspirin           Current Medications (12/15/2020):  This is the current hospital active medication list Current Facility-Administered Medications  Medication Dose Route Frequency Provider Last Rate Last Admin  . 0.9 %  sodium chloride infusion  250 mL Intravenous PRN Richardson Dopp T, PA-C      . acetaminophen (TYLENOL) tablet 650 mg  650 mg Oral Q4H PRN Weaver, Scott T, PA-C      . albuterol (VENTOLIN HFA) 108 (90 Base) MCG/ACT inhaler 2 puff  2 puff Inhalation Q4H PRN Richardson Dopp T, PA-C      . amiodarone (NEXTERONE) 1.8 mg/mL load via infusion 150 mg  150 mg Intravenous Once Baldwin Jamaica, PA-C       Followed by  .  amiodarone (NEXTERONE PREMIX) 360-4.14 MG/200ML-% (1.8 mg/mL) IV infusion  60 mg/hr Intravenous Continuous Baldwin Jamaica, PA-C       Followed by  . amiodarone (NEXTERONE PREMIX) 360-4.14 MG/200ML-% (1.8 mg/mL) IV infusion  30 mg/hr Intravenous Continuous Baldwin Jamaica, PA-C      . cholecalciferol (VITAMIN D3) tablet 1,000 Units  1,000 Units Oral Daily Richardson Dopp T, PA-C   1,000 Units at 12/15/20 1031  .  Difluprednate 0.05 % EMUL 1 drop  1 drop Right Eye TID Richardson Dopp T, PA-C      . digoxin (LANOXIN) tablet 0.0625 mg  0.0625 mg Oral QODAY Weaver, Scott T, PA-C   0.0625 mg at 12/14/20 1038  . diphenoxylate-atropine (LOMOTIL) 2.5-0.025 MG per tablet 1 tablet  1 tablet Oral QID PRN Richardson Dopp T, PA-C      . [START ON 12/16/2020] furosemide (LASIX) tablet 40 mg  40 mg Oral Daily Baldwin Jamaica, PA-C      . gatifloxacin (ZYMAXID) 0.5 % ophthalmic drops 1 drop  1 drop Right Eye TID AC & HS Weaver, Scott T, PA-C   1 drop at 12/15/20 1147  . metoprolol succinate (TOPROL-XL) 24 hr tablet 100 mg  100 mg Oral Daily Weaver, Scott T, PA-C   100 mg at 12/15/20 1031  . multivitamin with minerals tablet 1 tablet  1 tablet Oral Daily Richardson Dopp T, PA-C   1 tablet at 12/15/20 1032  . nitroGLYCERIN (NITROSTAT) SL tablet 0.4 mg  0.4 mg Sublingual Q5 min PRN Richardson Dopp T, PA-C      . ondansetron [facility]) injection 4 mg  4 mg Intravenous Q6H PRN Weaver, Scott T, PA-C      . pantoprazole (PROTONIX) EC tablet 40 mg  40 mg Oral BID Richardson Dopp T, PA-C   40 mg at 12/15/20 1032  . sodium chloride flush (NS) 0.9 % injection 3 mL  3 mL Intravenous Q12H Weaver, Scott T, PA-C   3 mL at 12/15/20 1039  . sodium chloride flush (NS) 0.9 % injection 3 mL  3 mL Intravenous PRN Richardson Dopp T, PA-C      . tamsulosin (FLOMAX) capsule 0.4 mg  0.4 mg Oral Daily Richardson Dopp T, PA-C   0.4 mg at 12/15/20 1032  . vitamin B-12 (CYANOCOBALAMIN) tablet 1,000 mcg  1,000 mcg Oral Daily Richardson Dopp T, PA-C   1,000 mcg at 12/15/20 1031     Discharge Medications: Please see discharge summary for a list of discharge medications.  Relevant Imaging Results:  Relevant Lab Results:   Additional Information [id number]  Trula Ore, LCSWA

## 2020-12-15 NOTE — H&P (Signed)
See consult note from 12/13/20 for H&P.  Allegra Lai, MD

## 2020-12-15 NOTE — Progress Notes (Addendum)
Progress Note  Patient Name: [name] [name] Date of Encounter: 12/15/2020  [facility] Cardiologist: Peter [name], MD   Subjective   I feel pretty good, denies any CP or SOB, eating breakfast.  Would like to get up, walks with a walker at home  Inpatient Medications    Scheduled Meds:  amiodarone  400 mg Oral BID   cholecalciferol  1,000 Units Oral Daily   Difluprednate  1 drop Right Eye TID   digoxin  0.0625 mg Oral QODAY   [START ON 12/16/2020] furosemide  40 mg Oral Daily   gatifloxacin  1 drop Right Eye TID AC & HS   metoprolol succinate  100 mg Oral Daily   multivitamin with minerals  1 tablet Oral Daily   pantoprazole  40 mg Oral BID   sodium chloride flush  3 mL Intravenous Q12H   tamsulosin  0.4 mg Oral Daily   vitamin B-12  1,000 mcg Oral Daily   Continuous Infusions:  sodium chloride     magnesium sulfate bolus IVPB     PRN Meds: sodium chloride, acetaminophen, albuterol, diphenoxylate-atropine, nitroGLYCERIN, ondansetron (ZOFRAN) IV, sodium chloride flush   Vital Signs    Vitals:   12/14/20 2043 12/14/20 2220 12/14/20 2352 12/15/20 0207  BP: 90/63  98/67   Pulse: 67  78   Resp: 18  18   Temp: 98 F (36.7 C) 98.3 F (36.8 C) 97.7 F (36.5 C)   TempSrc: Oral Oral Oral   SpO2: 96% 99% 98%   Weight:    61.5 kg  Height:        Intake/Output Summary (Last 24 hours) at 12/15/2020 0930 Last data filed at 12/14/2020 2353 Gross per 24 hour  Intake 2021.69 ml  Output 1450 ml  Net 571.69 ml   Last 3 Weights 12/15/2020 12/14/2020 12/10/2020  Weight (lbs) 135 lb 9.3 oz 130 lb 15.3 oz 130 lb  Weight (kg) 61.5 kg 59.4 kg 58.968 kg      Telemetry    AFib 70's-90's  No VT since yesterday AM - Personally Reviewed  ECG    No new EKGs - Personally Reviewed  Physical Exam   GEN: No acute distress, quite thin, elderly .   Neck: No JVD Cardiac: irreg-irreg, no murmurs, rubs, or gallops.  Respiratory: CTA b/l. GI: Soft, nontender, non-distended  MS:  trace-1+ edema; No deformity, thin, advanced atrophy Neuro:  Nonfocal  Psych: Normal affect   Labs    High Sensitivity Troponin:   Recent Labs  Lab 12/13/20 1223 12/13/20 1424  TROPONINIHS 23* 25*      Chemistry Recent Labs  Lab 12/13/20 1223 12/14/20 0429 12/15/20 0609  NA 139 141 137  K 3.5 3.3* 4.1  CL 102 101 99  CO2 24 27 25   GLUCOSE 175* 109* 111*  BUN 20 15 23   CREATININE 1.57* 1.40* 1.54*  CALCIUM 8.3* 8.7* 8.7*  PROT 5.6*  --   --   ALBUMIN 3.0*  --   --   AST 37  --   --   ALT 18  --   --   ALKPHOS 51  --   --   BILITOT 1.9*  --   --   GFRNONAA 43* 49* 44*  ANIONGAP 13 13 13      Hematology Recent Labs  Lab 12/09/20 1527 12/13/20 1223  WBC 8.9 10.2  RBC 3.07* 3.19*  HGB 9.1* 9.3*  HCT 28.9* 30.2*  MCV 94.1 94.7  MCH 29.6 29.2  MCHC  31.5 30.8  RDW 15.0 15.2  PLT 279 284    BNP Recent Labs  Lab 12/13/20 1228  BNP 436.0*     DDimer No results for input(s): DDIMER in the last 168 hours.   Radiology    DG Chest Port 1 View Result Date: 12/13/2020 CLINICAL DATA:  . Pt's daughter reports seizure-like activity lasting appro 1 minute. No h of seizure. H of CHF, COPD, A-Fib.Fire department arrived prior to EMS reported shallow breathing and unable to obtain carotid pulse EXAM: PORTABLE CHEST 1 VIEW COMPARISON:  12/11/2020 FINDINGS: Stable changes from prior CABG surgery. Cardiac silhouette is mildly enlarged. Mediastinal or hilar masses. Left anterior chest wall AICD is stable. Prominent interstitial and vascular markings bilaterally. There is additional opacity at the left lung base, which appears more prominent than on the recent prior study. This may reflect atelectasis or infiltrate. Possible small left pleural effusion. No evidence of a right pleural effusion. No pneumothorax. Skeletal structures grossly intact IMPRESSION: 1. Opacity at the left lung base, which may reflect atelectasis or pneumonia. 2. Vascular and interstitial prominence similar  to the prior study without convincing pulmonary edema. Possible small left pleural effusion. Electronically Signed   By: Lajean Manes M.D.   On: 12/13/2020 12:44    Cardiac Studies   10/25/20: TTE IMPRESSIONS   1. Left ventricular ejection fraction, by estimation, is 25 to 30%. The  left ventricle has severely decreased function. The left ventricle  demonstrates global hypokinesis. Left ventricular diastolic function could  not be evaluated. There is severe  akinesis of the left ventricular, basal-mid inferior and inferolateral  walls. Incoordinate (paced) septal motion.   2. Right ventricular systolic function is moderately reduced. The right  ventricular size is mildly enlarged. There is severely elevated pulmonary  artery systolic pressure. The estimated right ventricular systolic  pressure is 28.3 mmHg.   3. Left atrial size was massively dilated.   4. Right atrial size was moderately dilated.   5. Mild mitral subvalvular calcification.   6. Mild mitral subvalvular thickening/fibrosis.   7. Tethering of the mitral valve posterior leaflet.   8. The mitral valve is abnormal. Severe mitral valve regurgitation.   9. The tricuspid valve is abnormal. Tricuspid valve regurgitation is  moderate.  10. The aortic valve is tricuspid. Aortic valve regurgitation is mild.  Mild aortic valve sclerosis is present, with no evidence of aortic valve  stenosis.  11. Moderately dilated pulmonary artery.  12. The inferior vena cava is dilated in size with <50% respiratory  variability, suggesting right atrial pressure of 15 mmHg.   Patient Profile     87 y.o. male CAD ( MI > CABG 2002), Afib (not on a/c 2/2 GIB characterized as "major", by cardiology note and recurrent), ICM, Chronic CHF (systolic), ICD >> PPM, HTN, HLD.   note that in 2016 when his ICD was nearing ERI, was discussed given no V arrhythmias, an in appropriate therapy for AF only, LVEF improved to 40%,  and advanced age, though did  have 11% V pacing, decided to replace his device with PPM.   The patient was hospitalized Nov 2021 with fever, malaise, GIB, COVID, sepsis, cardiology during that admission for tachycardia and WCT felt to be VT and was cardioverted in the ER.  Managed wit IV amiodarone.  TTE during that admission noted reduction in his EF, was started on dig as well and dilt > eventually amio stopped, discharged 11/04/20 on BB and dig for rate control as well as  ACE, lasix, aldactone.  He saw Dr. [name] via tele health 12/10/20, he had some AMS and marked DOE, discussed perhaps residual from COVID infection back in Nov, volume OL , planned for CXR and resumption of his lasix (that he had not taken for 5 days).  He was admitted yesterday after syncopal event at home, EMS was called, lethargic, though improved in their prsence, in the ER developed VT while being prepped for cardioversion had spontaneous conversion. Started on amidarone. He was seen/admitted by EP, Dr. Curt Bears, started on amiodarone gtt, planned for diuresis, n o CP and no significant HS trop, not felt to be ischemic  Early this AM with recurrent sustained VT > lidocaine gtt started   Device information SJM dual chamber PPM generator 09/2015 Leads are MDT RV lead is a 6947 (both RA and RV are from 2009)    Assessment & Plan    1. VT     None further  stop lidocaine gtt today Transition to PO amiodarone K+ better, mag replacement ordered   2. Acute on chronic HF 3. ICM     No active SOB currently     Unknown I/O though pt reports good urine OP     Exam/edema suggests euvolemia         Transition to PO lasix     Follow lytes and renal function   4. CAD     No CP now or at home     HS Trop  Low and flat     Not felt to be actively ischemic     Pt would like to avoid invasive procedures/cath if able     No recent ischemic evaluation     No plans for cath/ischemic (as dicussed by Dr Lovena Le yesterday)      5. Permanent AFib      Not an a/c candidate 2/2 GIBs     Rate generally ok 70's-90's     Continue dig and BB       For questions or updates, please contact [place] Please consult [email or web address] for contact info under     Signed, Baldwin Jamaica, PA-C  12/15/2020, 9:30 AM    I have seen and examined this patient with Tommye Standard.  Agree with above, note added to reflect my findings.  On exam, RRR, no murmurs. Patient with no further VT. Lillymae Duet stop lidocaine and switch to PO amiodarone. Remus Hagedorn also switch to PO lasix. Likely discharge tomorrow. PT eval pending.    Claudia Greenley M. Jasiri Hanawalt MD 12/15/2020 10:48 AM

## 2020-12-15 NOTE — Plan of Care (Signed)
  Problem: Education: Goal: Knowledge of General Education information will improve Description: Including pain rating scale, medication(s)/side effects and non-pharmacologic comfort measures Outcome: Progressing   Problem: Health Behavior/Discharge Planning: Goal: Ability to manage health-related needs will improve Outcome: Progressing   Problem: Education: Goal: Knowledge of General Education information will improve Description: Including pain rating scale, medication(s)/side effects and non-pharmacologic comfort measures Outcome: Progressing   Problem: Health Behavior/Discharge Planning: Goal: Ability to manage health-related needs will improve Outcome: Progressing   Problem: Clinical Measurements: Goal: Ability to maintain clinical measurements within normal limits will improve Outcome: Progressing Goal: Will remain free from infection Outcome: Progressing Goal: Diagnostic test results will improve Outcome: Progressing Goal: Respiratory complications will improve Outcome: Progressing Goal: Cardiovascular complication will be avoided Outcome: Progressing   Problem: Activity: Goal: Risk for activity intolerance will decrease Outcome: Progressing   Problem: Nutrition: Goal: Adequate nutrition will be maintained Outcome: Progressing   Problem: Coping: Goal: Level of anxiety will decrease Outcome: Progressing   Problem: Elimination: Goal: Will not experience complications related to bowel motility Outcome: Progressing Goal: Will not experience complications related to urinary retention Outcome: Progressing   Problem: Pain Managment: Goal: General experience of comfort will improve Outcome: Progressing   Problem: Safety: Goal: Ability to remain free from injury will improve Outcome: Progressing   Problem: Skin Integrity: Goal: Risk for impaired skin integrity will decrease Outcome: Progressing   Problem: Education: Goal: Ability to manage disease process  will improve Outcome: Progressing Goal: Individualized Educational Video(s) Outcome: Progressing   Problem: Cardiac: Goal: Ability to achieve and maintain adequate cardiopulmonary perfusion will improve Outcome: Progressing

## 2020-12-15 NOTE — Progress Notes (Signed)
PT Cancellation Note  Patient Details Name: JASANI DOLNEY MRN: [id number] DOB: Feb 28, 1932   Cancelled Treatment:    Reason Eval/Treat Not Completed: Active bedrest order Will await increase in activity orders prior to PT evaluation. Will follow.   Marguarite Arbour A Annie Roseboom 12/15/2020, 7:37 AM Marisa Severin, PT, DPT Acute Rehabilitation Services Pager [phone] Office [phone]

## 2020-12-16 LAB — MAGNESIUM: Magnesium: 2 mg/dL (ref 1.7–2.4)

## 2020-12-16 LAB — BASIC METABOLIC PANEL
Anion gap: 12 (ref 5–15)
BUN: 27 mg/dL — ABNORMAL HIGH (ref 8–23)
CO2: 25 mmol/L (ref 22–32)
Calcium: 8.7 mg/dL — ABNORMAL LOW (ref 8.9–10.3)
Chloride: 98 mmol/L (ref 98–111)
Creatinine, Ser: 1.64 mg/dL — ABNORMAL HIGH (ref 0.61–1.24)
GFR, Estimated: 40 mL/min — ABNORMAL LOW (ref >60)
Glucose, Bld: 105 mg/dL — ABNORMAL HIGH (ref 70–99)
Potassium: 3.7 mmol/L (ref 3.5–5.1)
Sodium: 135 mmol/L (ref 135–145)

## 2020-12-16 MED ORDER — POTASSIUM CHLORIDE CRYS ER 20 MEQ PO TBCR
40.0000 meq | EXTENDED_RELEASE_TABLET | Freq: Once | ORAL | Status: AC
  Administered 2020-12-16: 40 meq via ORAL
  Filled 2020-12-16: qty 2

## 2020-12-16 MED ORDER — AMIODARONE HCL 200 MG PO TABS
400.0000 mg | ORAL_TABLET | Freq: Two times a day (BID) | ORAL | Status: DC
  Administered 2020-12-16 – 2020-12-17 (×4): 400 mg via ORAL
  Filled 2020-12-16 (×4): qty 2

## 2020-12-16 NOTE — Progress Notes (Signed)
Remote pacemaker transmission.   

## 2020-12-16 NOTE — TOC Progression Note (Addendum)
Transition of Care [facility]) - Progression Note    Patient Details  Name: WEBB WEED MRN: [id number] Date of Birth: 06-Oct-1932  Transition of Care [facility]) CM/SW [place], [place] Phone Number: 12/16/2020, 2:43 PM  Clinical Narrative:     Update-CSW received callback from patients insurance. Patient has regular [employer]. CSW let Juliann Pulse know with [facility]. Juliann Pulse confirmed she will start insurance authorization for patient.  CSW will continue to follow.  CSW spoke with patients daughter [name] and provided SNF bed offers. Patients daughter accepted SNF bed offer with [facility]. CSW tried to start insurance authorization. Insurance needs to do eligibility check to see who patient is managed by. They will call CSW back with determination.  CSW will continue to follow.    Expected Discharge Plan: [place] Barriers to Discharge: Continued Medical Work up  Expected Discharge Plan and Services Expected Discharge Plan: [place] arrangements for the past 2 months: Single Family Home                                       Social Determinants of Health (SDOH) Interventions    Readmission Risk Interventions No flowsheet data found.

## 2020-12-16 NOTE — Progress Notes (Addendum)
Progress Note  Patient Name: [name] [name] Date of Encounter: 12/16/2020  [name] Cardiologist: [name] [name], MD   Subjective   A bit confused this morning with some details, not with others.  Denies CP or SOB, no coughing  Inpatient Medications    Scheduled Meds:  cholecalciferol  1,000 Units Oral Daily   digoxin  0.0625 mg Oral QODAY   furosemide  40 mg Oral Daily   gatifloxacin  1 drop Right Eye TID AC & HS   metoprolol succinate  100 mg Oral Daily   multivitamin with minerals  1 tablet Oral Daily   pantoprazole  40 mg Oral BID   potassium chloride  40 mEq Oral Once   sodium chloride flush  3 mL Intravenous Q12H   tamsulosin  0.4 mg Oral Daily   vitamin B-12  1,000 mcg Oral Daily   Continuous Infusions:  sodium chloride     amiodarone 30 mg/hr (12/16/20 0434)   PRN Meds: sodium chloride, acetaminophen, albuterol, diphenoxylate-atropine, nitroGLYCERIN, ondansetron (ZOFRAN) IV, sodium chloride flush   Vital Signs    Vitals:   12/15/20 1905 12/15/20 2007 12/16/20 0335 12/16/20 0500  BP: 108/83 95/69 113/77   Pulse:  92 65   Resp:  20 17   Temp:  (!) 97.4 F (36.3 C) 97.8 F (36.6 C)   TempSrc:  Oral Oral   SpO2:  98% 97%   Weight:    61.2 kg  Height:        Intake/Output Summary (Last 24 hours) at 12/16/2020 0800 Last data filed at 12/16/2020 0500 Gross per 24 hour  Intake --  Output 650 ml  Net -650 ml   Last 3 Weights 12/16/2020 12/15/2020 12/14/2020  Weight (lbs) 134 lb 14.7 oz 135 lb 9.3 oz 130 lb 15.3 oz  Weight (kg) 61.2 kg 61.5 kg 59.4 kg      Telemetry    AFib 70's-90's  No VT since yesterday AM - Personally Reviewed  ECG    No new EKGs - Personally Reviewed  Physical Exam   GEN: No acute distress, quite thin, elderly .   Neck: No JVD Cardiac: irreg-irreg, no murmurs, rubs, or gallops.  Respiratory: CTA b/l. GI: Soft, nontender, non-distended  MS: no edema; No deformity, thin, advanced atrophy Neuro:  Nonfocal, slightly  confused this morning, mores so overnight Psych: Normal affect   Labs    High Sensitivity Troponin:   Recent Labs  Lab 12/13/20 1223 12/13/20 1424  TROPONINIHS 23* 25*      Chemistry Recent Labs  Lab 12/13/20 1223 12/14/20 0429 12/15/20 0609 12/16/20 0324  NA 139 141 137 135  K 3.5 3.3* 4.1 3.7  CL 102 101 99 98  CO2 24 27 25 25   GLUCOSE 175* 109* 111* 105*  BUN 20 15 23  27*  CREATININE 1.57* 1.40* 1.54* 1.64*  CALCIUM 8.3* 8.7* 8.7* 8.7*  PROT 5.6*  --   --   --   ALBUMIN 3.0*  --   --   --   AST 37  --   --   --   ALT 18  --   --   --   ALKPHOS 51  --   --   --   BILITOT 1.9*  --   --   --   GFRNONAA 43* 49* 44* 40*  ANIONGAP 13 13 13 12      Hematology Recent Labs  Lab 12/09/20 1527 12/13/20 1223  WBC 8.9 10.2  RBC 3.07* 3.19*  HGB 9.1* 9.3*  HCT 28.9* 30.2*  MCV 94.1 94.7  MCH 29.6 29.2  MCHC 31.5 30.8  RDW 15.0 15.2  PLT 279 284    BNP Recent Labs  Lab 12/13/20 1228  BNP 436.0*     DDimer No results for input(s): DDIMER in the last 168 hours.   Radiology    DG Chest Port 1 View Result Date: 12/13/2020 CLINICAL DATA:  . Pt's daughter reports seizure-like activity lasting appro 1 minute. No h of seizure. H of CHF, COPD, A-Fib.Fire department arrived prior to EMS reported shallow breathing and unable to obtain carotid pulse EXAM: PORTABLE CHEST 1 VIEW COMPARISON:  12/11/2020 FINDINGS: Stable changes from prior CABG surgery. Cardiac silhouette is mildly enlarged. Mediastinal or hilar masses. Left anterior chest wall AICD is stable. Prominent interstitial and vascular markings bilaterally. There is additional opacity at the left lung base, which appears more prominent than on the recent prior study. This may reflect atelectasis or infiltrate. Possible small left pleural effusion. No evidence of a right pleural effusion. No pneumothorax. Skeletal structures grossly intact IMPRESSION: 1. Opacity at the left lung base, which may reflect atelectasis or  pneumonia. 2. Vascular and interstitial prominence similar to the prior study without convincing pulmonary edema. Possible small left pleural effusion. Electronically Signed   By: Lajean Manes KevinD.   On: 12/13/2020 12:44    Cardiac Studies   10/25/20: TTE IMPRESSIONS   1. Left ventricular ejection fraction, by estimation, is 25 to 30%. The  left ventricle has severely decreased function. The left ventricle  demonstrates global hypokinesis. Left ventricular diastolic function could  not be evaluated. There is severe  akinesis of the left ventricular, basal-mid inferior and inferolateral  walls. Incoordinate (paced) septal motion.   2. Right ventricular systolic function is moderately reduced. The right  ventricular size is mildly enlarged. There is severely elevated pulmonary  artery systolic pressure. The estimated right ventricular systolic  pressure is 32.2 mmHg.   3. Left atrial size was massively dilated.   4. Right atrial size was moderately dilated.   5. Mild mitral subvalvular calcification.   6. Mild mitral subvalvular thickening/fibrosis.   7. Tethering of the mitral valve posterior leaflet.   8. The mitral valve is abnormal. Severe mitral valve regurgitation.   9. The tricuspid valve is abnormal. Tricuspid valve regurgitation is  moderate.  10. The aortic valve is tricuspid. Aortic valve regurgitation is mild.  Mild aortic valve sclerosis is present, with no evidence of aortic valve  stenosis.  11. Moderately dilated pulmonary artery.  12. The inferior vena cava is dilated in size with <50% respiratory  variability, suggesting right atrial pressure of 15 mmHg.   Patient Profile     87 y.o. male CAD ( MI > CABG 2002), Afib (not on a/c 2/2 GIB characterized as "major", by cardiology note and recurrent), ICM, Chronic CHF (systolic), ICD >> PPM, HTN, HLD.   note that in 2016 when his ICD was nearing ERI, was discussed given no V arrhythmias, an in appropriate therapy for  AF only, LVEF improved to 40%,  and advanced age, though did have 11% V pacing, decided to replace his device with PPM.   The patient was hospitalized Nov 2021 with fever, malaise, GIB, COVID, sepsis, cardiology during that admission for tachycardia and WCT felt to be VT and was cardioverted in the ER.  Managed wit IV amiodarone.  TTE during that admission noted reduction in his EF, was started on dig as well and  dilt > eventually amio stopped, discharged 11/04/20 on BB and dig for rate control as well as ACE, lasix, aldactone.  He saw Dr. [name] via tele health 12/10/20, he had some AMS and marked DOE, discussed perhaps residual from COVID infection back in Nov, volume OL , planned for CXR and resumption of his lasix (that he had not taken for 5 days).  He was admitted yesterday after syncopal event at home, EMS was called, lethargic, though improved in their prsence, in the ER developed VT while being prepped for cardioversion had spontaneous conversion. Started on amidarone. He was seen/admitted by EP, Dr. Curt Bears, started on amiodarone gtt, planned for diuresis, n o CP and no significant HS trop, not felt to be ischemic  Early the following AM with recurrent sustained VT > lidocaine gtt started   Device information SJM dual chamber PPM generator 09/2015 Leads are MDT RV lead is a 6947 (both RA and RV are from 2009)    Assessment & Plan    1. VT     None further  stopped lidocaine gtt yesterday Transitioned to PO amiodarone yesterday though in the afternoon had new and frequent NSVT (vs aberency) and rebolused with amio and gtt resumed Remains in rate controlled Afib, less ectopy/NSVT burden  [name] [name] d/w MD retying transition to PO today again  K+ better, [name] [name] replace to keep 4.0 or better, mag better   2. Acute on chronic HF 3. ICM     No active SOB currently     Exam is euvolemic, perhaps a touch dry even     Creat up slightly, [name] [name] hold lasix today     CXRs noted, wean off O2,  lungs are clear on exam, no cough or SOB, afebrile   4. CAD     No CP now or at home     HS Trop  Low and flat     Not felt to be actively ischemic     Pt would like to avoid invasive procedures/cath if able     No recent ischemic evaluation     No plans for cath/ischemic (as dicussed by Dr Lovena Le yesterday)      5. Permanent AFib     Not an a/c candidate 2/2 GIBs     Rate generally ok 70's-90's     Continue dig and BB     (dig level 1/17 was 0.4)       6. PT recommends SNF/24hour supervision     Patient tells me today he is not agreeable to consider SNF/rehab and [name] [name] go nowhere but back home     Continue to mobilize today     Sounds like he is starting to have some nighttime confusion, d/w RN improtance of frequent reorientation, and OOB      Not unusual for man his age to get some confusion in the hospital      He was fairly clear for me, but intermittently altered for staff.      D/w his daughter today, she is 100% on board with SNF/rehab.  Prior to his covid hospital stay was independent but since then despite rehab afterwards he remained quite weak and mentioned with that hospital stay he became quite confused intermittently as well.  Had been back home but she did not think he got much in the way of rehab and had concerns about him being able to be home, and she/her brother can not provide 24hr assistance.        For  questions or updates, please contact [name] [name] Please consult [email or web address] for contact info under     Signed, [name] Jamaica, PA-C  12/16/2020, 8:00 AM    I have seen and examined this patient with irregular, no murmurs, no edema.  Agree with above, note added to reflect my findings.  On exam, patient with no further sustained ventricular tachycardia.  We Lailana Shira switch to p.o. amiodarone today with likely discharge tomorrow.  He does remain confused which per his daughter happens when he is hospitalized.  He Austynn Pridmore need SNF at discharge.    [name] [name] M.  Raun Routh MD 12/16/2020 12:30 PM

## 2020-12-17 ENCOUNTER — Ambulatory Visit: Payer: Medicare HMO | Admitting: Cardiology

## 2020-12-17 ENCOUNTER — Telehealth: Payer: Medicare HMO | Admitting: Cardiology

## 2020-12-17 DIAGNOSIS — I472 Ventricular tachycardia: Secondary | ICD-10-CM | POA: Diagnosis not present

## 2020-12-17 LAB — BASIC METABOLIC PANEL
Anion gap: 11 (ref 5–15)
BUN: 24 mg/dL — ABNORMAL HIGH (ref 8–23)
CO2: 25 mmol/L (ref 22–32)
Calcium: 8.6 mg/dL — ABNORMAL LOW (ref 8.9–10.3)
Chloride: 100 mmol/L (ref 98–111)
Creatinine, Ser: 1.42 mg/dL — ABNORMAL HIGH (ref 0.61–1.24)
GFR, Estimated: 48 mL/min — ABNORMAL LOW (ref >60)
Glucose, Bld: 66 mg/dL — ABNORMAL LOW (ref 70–99)
Potassium: 4.1 mmol/L (ref 3.5–5.1)
Sodium: 136 mmol/L (ref 135–145)

## 2020-12-17 LAB — SARS CORONAVIRUS 2 (TAT 6-24 HRS): SARS Coronavirus 2: NEGATIVE

## 2020-12-17 MED ORDER — AMIODARONE HCL 200 MG PO TABS: 400.0000 mg | ORAL_TABLET | Freq: Two times a day (BID) | ORAL | Status: DC

## 2020-12-17 MED ORDER — MAGNESIUM OXIDE -MG SUPPLEMENT 200 MG PO TABS: 200.0000 mg | ORAL_TABLET | Freq: Every day | ORAL | 0 refills | Status: AC

## 2020-12-17 NOTE — Progress Notes (Signed)
Physical Therapy Treatment Patient Details Name: [name] [name] MRN: [id number] DOB: 1932/09/22 Today's Date: 12/17/2020    History of Present Illness Patient is a 87 y/o male who presents with seizure like activity, SOB, dizziness, CP and weakness. Found to have episodes of ventricular tachycardia in the ED and spontaneously converted prior to cardioversion. PMH includes CAD s/p CABG 2002, s/p PPM, ischemic cardiomyopathy, systolic CHF, HTN, PAF not on anticoagulation due to GI bleed, back surgery, colon ca, COVID -19.    PT Comments    Pt with improved mobility and cognition but still has deficits in both. Able to amb in hall but requires assist for balance. Pt with fluctuating cognition. Pt initially refused to get OOB and said he was going home. He then almost immediately stated he had been kept in the bed for 22 days and was eager to get up when I said, "Then let's get up." Pt reported he had to wait for 2 hours and 15 minutes at that other place for someone to take him to the bathroom which very well could be accurate but then he stood up with [place] and he had been incontinent of stool in the bed and was totally unaware. I expect his cognition would likely be better in the familiar environment of home but without even initial 24 hour assist to help him transition to home he will be high fall risk. Will continue to recommend SNF at DC. If pt refuses this would maximize home services.    Follow Up Recommendations  SNF;Supervision for mobility/OOB     Equipment Recommendations  None recommended by PT    Recommendations for Other Services       Precautions / Restrictions Precautions Precautions: Fall    Mobility  Bed Mobility Overal bed mobility: Needs Assistance Bed Mobility: Supine to Sit     Supine to sit: HOB elevated;Supervision     General bed mobility comments: supervision for safety/lines  Transfers Overall transfer level: Needs assistance Equipment used: 4-wheeled  walker Transfers: Sit to/from Stand Sit to Stand: Min assist         General transfer comment: Min assist for balance to rise and to stabilize rollator. Pt pulled up on rollator with hands despite cues to push up from bed  Ambulation/Gait Ambulation/Gait assistance: Min guard Gait Distance (Feet): 150 Feet Assistive device: 4-wheeled walker Gait Pattern/deviations: Step-through pattern;Decreased stride length;Trunk flexed Gait velocity: decr Gait velocity interpretation: <1.8 ft/sec, indicate of risk for recurrent falls General Gait Details: Assist for safety. Pt with significant forward trunk flexion and tends to push walker too far in front of himself.   Stairs             Wheelchair Mobility    Modified Rankin (Stroke Patients Only)       Balance Overall balance assessment: Needs assistance Sitting-balance support: Single extremity supported;No upper extremity supported Sitting balance-Leahy Scale: Fair     Standing balance support: During functional activity;Bilateral upper extremity supported Standing balance-Leahy Scale: Poor Standing balance comment: rollator and min to min guard assist for static standing. Pt initially with posterior lean.                            Cognition Arousal/Alertness: Awake/alert Behavior During Therapy:  (irritated) Overall Cognitive Status: Impaired/Different from baseline Area of Impairment: Orientation;Memory;Attention;Following commands;Safety/judgement;Problem solving                 Orientation Level: Disoriented to;Time (Pt  frequently combining time from this hospitalization with recent stay at [facility]. Saying he has been in this bed for 22 days) Current Attention Level: Sustained Memory: Decreased short-term memory Following Commands: Follows multi-step commands inconsistently;Follows one step commands consistently Safety/Judgement: Decreased awareness of safety;Decreased awareness of deficits   Problem  Solving: Requires verbal cues;Requires tactile cues General Comments: Pt stated several times that he had amb up and down the hall yesterday which isn't true.      Exercises      General Comments General comments (skin integrity, edema, etc.): VSS on RA      Pertinent Vitals/Pain Pain Assessment: Faces Faces Pain Scale: No hurt    Home Living                      Prior Function            PT Goals (current goals can now be found in the care plan section) Acute Rehab PT Goals Patient Stated Goal: go home Progress towards PT goals: Progressing toward goals    Frequency    Min 2X/week      PT Plan Current plan remains appropriate    Co-evaluation              AM-PAC PT "6 Clicks" Mobility   Outcome Measure  Help needed turning from your back to your side while in a flat bed without using bedrails?: A Little Help needed moving from lying on your back to sitting on the side of a flat bed without using bedrails?: A Little Help needed moving to and from a bed to a chair (including a wheelchair)?: A Little Help needed standing up from a chair using your arms (e.g., wheelchair or bedside chair)?: A Little Help needed to walk in hospital room?: A Little Help needed climbing 3-5 steps with a railing? : Total 6 Click Score: 16    End of Session Equipment Utilized During Treatment: Gait belt Activity Tolerance: Patient tolerated treatment well Patient left: in chair;with call bell/phone within reach;with chair alarm set;with nursing/sitter in room Nurse Communication: Mobility status PT Visit Diagnosis: Unsteadiness on feet (R26.81);Other abnormalities of gait and mobility (R26.89);Muscle weakness (generalized) (M62.81)     Time: 1012-1029 PT Time Calculation (min) (ACUTE ONLY): 17 min  Charges:  $Gait Training: 8-22 mins                     [place] Pager [phone] Office [place] 12/17/2020, 11:27 AM

## 2020-12-17 NOTE — Discharge Summary (Signed)
DISCHARGE SUMMARY    Patient ID: [name] [name],  MRN: [id number], DOB/AGE: 1932/05/09 87 y.o.  Admit date: 12/13/2020 Discharge date: 12/17/2020  Primary Care Physician: [name] Infante, MD  Primary Cardiologist: Dr. [name] Electrophysiologist: Dr. Rayann [name]  Primary Discharge Diagnosis:  1. VT  2. Acute/chronic CHF (systolic)   Secondary Discharge Diagnosis:  1. Permanent AFib     Not an a/c candidate 2/2 h/o GIBs 2. ICM 3. PPM 4. HTN  Allergies  Allergen Reactions  . Iron     Stomach issues  . Lescol [Fluvastatin Sodium]     Dizziness  . Pentazocine Lactate     unknown  . Vytorin [Ezetimibe-Simvastatin]     Leg cramps   . Aspirin     Bleeding      Procedures This Admission:  none   Brief HPI: [name] [name] is a 87 y.o. male with PMHx as above had been hospitalized Nov 2021 with fever, malaise, GIB, COVID, sepsis, cardiology during that admission for tachycardia and WCT felt to be VT and was cardioverted in the ER.  Managed wit IV amiodarone.  TTE during that admission noted reduction in his EF, was started on dig as well and dilt > eventually amio stopped, discharged 11/04/20 on BB and dig for rate control as well as ACE, lasix, aldactone.  He saw Dr. [name] via tele health 12/10/20, he had some AMS and marked DOE, discussed perhaps residual from COVID infection back in Nov, volume OL , planned for CXR and resumption of his lasix (that he had not taken for 5 days).  He was admitted 12/13/20 after syncopal event at home, EMS was called, lethargic, though improved in their prsence, in the ER developed VT while being prepped for cardioversion had spontaneous conversion. Started on amidarone.  Labs without any significant abnormalities, outside of a mag level 1.4  He was seen/admitted by EP, Dr. Curt [name], started on amiodarone gtt, planned for diuresis, n o CP and no significant HS trop, not felt to be ischemic Dr. Curt [name] noted  Device his interrogation showed  ventricular tachycardia this morning.      Hospital Course:  While in the ER he had recurrent VT and started lidocaine gtt.  He was transitioned to PO amiodarone and lidocaine gtt stopped though developed an up tick in NSVT WCT and amiodarone gtt resumed another day.  Yesterday his amiodarone transitioned to PO again since he had has had none further.  His Afib rate controlled with some V pacing as well.   CXRs note likely post COVID findings, also felt to be volume OL  He was afebrile, and without ongoing SOB once diuresed, not felt to have pneumonia.  Vitals have been stable He diuresed well and was transitioned to PO home dose lasix. No CP, no ongoing SOB.  No ischemic w/u was felt to be needed and not recommended, also given advanced age, PPM upgrade back to ICD not recommended.  He did developed particularly nighttime confusion that yesterday was slow to clear through the day though this morning much improved.  In discussion with his daughter, he had confusion during his last hospitalization as well.  PT evaluation recommended SNF/24 assistance, in d/w his daughter, she/her brother would not be unable to provide 24hour assistance and she felt much better about him returing to SNF for rehab, and eventually home if able. The patient initially resisted the idea though after speaking with his daughter is agreeable to go to SNF for rehab  Creat  rose some with diuresis though trending back down.  He   The patient today denies any CP/SOB.  He ambulated with PT today and did a bit better though still recommended SNF.  He was examined by Dr. Rayann [name] and considered stable for discharge to SNF.  He has a SNF bed available, they are requiring new COVID test that has been ordered.   Amiodarone is added to his home regime, 400mg  BID for 10 days then 400mg  daily for 10 days and then 200mg  daily will resume his home ACE and aldactone. Will add mag Ox 200mg  daily Recommend BMET and mag level in a  week via SNF   Physical Exam: Vitals:   12/16/20 2002 12/17/20 0032 12/17/20 0344 12/17/20 1000  BP: (!) 96/57 102/71 114/70   Pulse: (!) 58 62 60   Resp: 16 20 15    Temp: (!) 97.5 F (36.4 C) 97.8 F (36.6 C) 98.9 F (37.2 C)   TempSrc: Oral Axillary Axillary   SpO2: 97% 97% 98% 98%  Weight:   59.7 kg   Height:        GEN- The patient is well appearing, alert and oriented x 3 today.   HEENT: normocephalic, atraumatic; sclera clear, conjunctiva pink; hearing intact; oropharynx clear; neck supple, no JVP Lungs- CTA b/l, normal work of breathing.  No wheezes, rales, rhonchi Heart- irreg-irreg, no murmurs, rubs or gallops, PMI not laterally displaced GI- soft, non-tender, non-distended Extremities- no clubbing, cyanosis, or edema MS- no significant deformity, age appropriate/perhaps advanced atrophy Skin- warm and dry, no rash or lesion Psych- euthymic mood, full affect Neuro- no gross focal motor deficits, he has had some intermittent confusion particularly nighttime   Labs:   Lab Results  Component Value Date   WBC 10.2 12/13/2020   HGB 9.3 (L) 12/13/2020   HCT 30.2 (L) 12/13/2020   MCV 94.7 12/13/2020   PLT 284 12/13/2020    Recent Labs  Lab 12/13/20 1223 12/14/20 0429 12/17/20 0256  NA 139   < > 136  K 3.5   < > 4.1  CL 102   < > 100  CO2 24   < > 25  BUN 20   < > 24*  CREATININE 1.57*   < > 1.42*  CALCIUM 8.3*   < > 8.6*  PROT 5.6*  --   --   BILITOT 1.9*  --   --   ALKPHOS 51  --   --   ALT 18  --   --   AST 37  --   --   GLUCOSE 175*   < > 66*   < > = values in this interval not displayed.    Discharge Medications:  Allergies as of 12/17/2020      Reactions   Iron    Stomach issues   Lescol [fluvastatin Sodium]    Dizziness   Pentazocine Lactate    unknown   Vytorin [ezetimibe-simvastatin]    Leg cramps   Aspirin    Bleeding      Medication List    TAKE these medications   amiodarone 200 MG tablet Commonly known as: Pacerone Take 2  tablets (400 mg total) by mouth 2 (two) times daily. START with 400mg  PO BID for 10 days THEN reduce to 400mg  PO daily for 10 days THEN reduce to 200mg  PO daily   Besivance 0.6 % Susp Generic drug: Besifloxacin HCl Place 1 drop into the right eye 3 (three) times daily.   cholecalciferol 1000  units tablet Commonly known as: VITAMIN D Take 1,000 Units by mouth daily.   Digoxin 62.5 MCG Tabs Take 0.0625 mg by mouth every other day.   diphenoxylate-atropine 2.5-0.025 MG tablet Commonly known as: LOMOTIL Take 1 tablet by mouth 4 (four) times daily as needed for diarrhea or loose stools.   Durezol 0.05 % Emul Generic drug: Difluprednate Place 1 drop into the right eye 3 (three) times daily.   furosemide 40 MG tablet Commonly known as: LASIX Take 1 tablet (40 mg total) by mouth daily.   Magnesium Oxide 200 MG Tabs Take 1 tablet (200 mg total) by mouth daily.   metoprolol succinate 100 MG 24 hr tablet Commonly known as: TOPROL-XL TAKE 1 TABLET (100 MG TOTAL) BY MOUTH DAILY WITH OR IMMEDIATELY FOLLOWING A MEAL What changed:   how much to take  how to take this  when to take this  additional instructions   multivitamins ther. w/minerals Tabs tablet Take 1 tablet by mouth daily.   nitroGLYCERIN 0.4 MG SL tablet Commonly known as: NITROSTAT Place 1 tablet (0.4 mg total) under the tongue every 5 (five) minutes as needed for chest pain.   pantoprazole 40 MG tablet Commonly known as: PROTONIX Take 1 tablet (40 mg total) by mouth 2 (two) times daily.   ProAir HFA 108 (90 Base) MCG/ACT inhaler Generic drug: albuterol Inhale 2 puffs into the lungs every 4 (four) hours as needed for shortness of breath.   ramipril 2.5 MG capsule Commonly known as: ALTACE Take 1 capsule (2.5 mg total) by mouth daily.   spironolactone 25 MG tablet Commonly known as: ALDACTONE TAKE 1/2 TABLET EVERY DAY (NEED MD APPOINTMENT) What changed: See the new instructions.   tamsulosin 0.4 MG Caps  capsule Commonly known as: FLOMAX Take 1 capsule (0.4 mg total) by mouth daily.   vitamin B-12 1000 MCG tablet Commonly known as: CYANOCOBALAMIN Take 1,000 mcg by mouth daily.       Disposition: SNF  Discharge Instructions    Diet - low sodium heart healthy   Complete by: As directed    Increase activity slowly   Complete by: As directed       Contact information for follow-up providers    Baldwin Jamaica, PA-C Follow up.   Specialty: Cardiology Why: 12/25/20 @ 8:15AM, for Dr. Amada Jupiter information: [place] [id number] [phone]            Contact information for after-discharge care    [facility] .   Service: Skilled Nursing Contact information: [address] [place] [phone]                  Duration of Discharge Encounter: Greater than 30 minutes including physician time.  Venetia Night, PA-C 12/17/2020 12:33 PM

## 2020-12-17 NOTE — Progress Notes (Addendum)
Patient is much clearer this morning. AAO x3, self, place year  He is adamant that he does not want to go to a rehab, that he spent 27 days in bed and did nothing for him, he could do better at home. But admits his legs are weak, and could not walk out on his own.  I don't see that PT worked with him yesterday. Not sure how much time he spent OOB, he was intermittently confused yesterday and tells me he did not get OOB.  VSS Telemetry stable 99% on RA this AM Pt denies any CP or SOB.  I think is ready for discharge, looks like he had a bed offer and daughter accepted Pending insurance   Dr. Rayann Heman has seen the patient this AM.  Ready for discharge from a medical perspective.   Patient is firm gets agitated with the idea of going back to SNF, he does not want to go back to a SNF.   P.T. will revisit this morning, and will see what their recommendations are.   Tommye Standard, PA-C  I have seen, examined the patient, and reviewed the above assessment and plan.  Changes to above are made where necessary.  On exam, RRR.  Elderly, frail and confused.  He does not wish to go to SNF but also does not wish to stay in the hospital. Not a candidate for an ICD. Continue amiodarone at discharge. Will plan discharge today if patient decides to go to SNF.    Co Sign: [name], MD 12/17/2020

## 2020-12-17 NOTE — Progress Notes (Signed)
Report given to nurse at [facility].

## 2020-12-17 NOTE — TOC Transition Note (Signed)
Transition of Care [facility]) - CM/SW Discharge Note   Patient Details  Name: [name] [name] MRN: [id number] Date of Birth: 10-Sep-1932  [facility] CM/SW Contact:  Trula Ore, [place] Phone Number: 12/17/2020, 5:08 PM   Clinical Narrative:     Patient will DC to: [facility]  Anticipated DC date: 12/17/2020  Family notified: [name]  Transport by: Corey Harold  ?  Per MD patient ready for DC to [facility] . RN, patient, patient's family, and facility notified of DC. Discharge Summary sent to facility. RN given number for report tele#[phone] RM#[id number]. DC packet on chart. Ambulance transport requested for patient.  CSW signing off.    Final next level of care: Skilled Nursing Facility Barriers to Discharge: No Barriers Identified   Patient Goals and CMS Choice   CMS Medicare.gov Compare Post Acute Care list provided to:: Patient Represenative (must comment) (daughter [name]) Choice offered to / list presented to : Adult Children [name])  Discharge Placement              Patient [facility] bed at: [facility] Patient to be transferred to facility by: [facility] Name of family member notified: Lora Patient and family notified of of transfer: 12/17/20  Discharge Plan and Services                                     Social Determinants of Health (SDOH) Interventions     Readmission Risk Interventions No flowsheet data found.

## 2020-12-18 DIAGNOSIS — D6869 Other thrombophilia: Secondary | ICD-10-CM | POA: Diagnosis not present

## 2020-12-18 DIAGNOSIS — Z951 Presence of aortocoronary bypass graft: Secondary | ICD-10-CM | POA: Diagnosis not present

## 2020-12-18 DIAGNOSIS — R338 Other retention of urine: Secondary | ICD-10-CM | POA: Diagnosis present

## 2020-12-18 DIAGNOSIS — R109 Unspecified abdominal pain: Secondary | ICD-10-CM | POA: Diagnosis not present

## 2020-12-18 DIAGNOSIS — Z66 Do not resuscitate: Secondary | ICD-10-CM | POA: Diagnosis not present

## 2020-12-18 DIAGNOSIS — I480 Paroxysmal atrial fibrillation: Secondary | ICD-10-CM | POA: Diagnosis not present

## 2020-12-18 DIAGNOSIS — N401 Enlarged prostate with lower urinary tract symptoms: Secondary | ICD-10-CM | POA: Diagnosis present

## 2020-12-18 DIAGNOSIS — I10 Essential (primary) hypertension: Secondary | ICD-10-CM | POA: Diagnosis not present

## 2020-12-18 DIAGNOSIS — I5023 Acute on chronic systolic (congestive) heart failure: Secondary | ICD-10-CM | POA: Diagnosis not present

## 2020-12-18 DIAGNOSIS — E785 Hyperlipidemia, unspecified: Secondary | ICD-10-CM | POA: Diagnosis present

## 2020-12-18 DIAGNOSIS — N184 Chronic kidney disease, stage 4 (severe): Secondary | ICD-10-CM | POA: Diagnosis not present

## 2020-12-18 DIAGNOSIS — M6281 Muscle weakness (generalized): Secondary | ICD-10-CM | POA: Diagnosis not present

## 2020-12-18 DIAGNOSIS — I130 Hypertensive heart and chronic kidney disease with heart failure and stage 1 through stage 4 chronic kidney disease, or unspecified chronic kidney disease: Secondary | ICD-10-CM | POA: Diagnosis present

## 2020-12-18 DIAGNOSIS — F411 Generalized anxiety disorder: Secondary | ICD-10-CM | POA: Diagnosis not present

## 2020-12-18 DIAGNOSIS — Z8616 Personal history of COVID-19: Secondary | ICD-10-CM | POA: Diagnosis not present

## 2020-12-18 DIAGNOSIS — I959 Hypotension, unspecified: Secondary | ICD-10-CM | POA: Diagnosis not present

## 2020-12-18 DIAGNOSIS — K529 Noninfective gastroenteritis and colitis, unspecified: Secondary | ICD-10-CM | POA: Diagnosis not present

## 2020-12-18 DIAGNOSIS — Z950 Presence of cardiac pacemaker: Secondary | ICD-10-CM | POA: Diagnosis not present

## 2020-12-18 DIAGNOSIS — R262 Difficulty in walking, not elsewhere classified: Secondary | ICD-10-CM | POA: Diagnosis not present

## 2020-12-18 DIAGNOSIS — W19XXXA Unspecified fall, initial encounter: Secondary | ICD-10-CM | POA: Diagnosis not present

## 2020-12-18 DIAGNOSIS — E039 Hypothyroidism, unspecified: Secondary | ICD-10-CM | POA: Diagnosis present

## 2020-12-18 DIAGNOSIS — Z95810 Presence of automatic (implantable) cardiac defibrillator: Secondary | ICD-10-CM | POA: Diagnosis not present

## 2020-12-18 DIAGNOSIS — E860 Dehydration: Secondary | ICD-10-CM | POA: Diagnosis not present

## 2020-12-18 DIAGNOSIS — E861 Hypovolemia: Secondary | ICD-10-CM | POA: Diagnosis not present

## 2020-12-18 DIAGNOSIS — D509 Iron deficiency anemia, unspecified: Secondary | ICD-10-CM | POA: Diagnosis not present

## 2020-12-18 DIAGNOSIS — I110 Hypertensive heart disease with heart failure: Secondary | ICD-10-CM | POA: Diagnosis not present

## 2020-12-18 DIAGNOSIS — R4182 Altered mental status, unspecified: Secondary | ICD-10-CM | POA: Diagnosis not present

## 2020-12-18 DIAGNOSIS — A0839 Other viral enteritis: Secondary | ICD-10-CM | POA: Diagnosis present

## 2020-12-18 DIAGNOSIS — I9589 Other hypotension: Secondary | ICD-10-CM | POA: Diagnosis present

## 2020-12-18 DIAGNOSIS — I5022 Chronic systolic (congestive) heart failure: Secondary | ICD-10-CM | POA: Diagnosis not present

## 2020-12-18 DIAGNOSIS — G929 Unspecified toxic encephalopathy: Secondary | ICD-10-CM | POA: Diagnosis not present

## 2020-12-18 DIAGNOSIS — R197 Diarrhea, unspecified: Secondary | ICD-10-CM | POA: Diagnosis not present

## 2020-12-18 DIAGNOSIS — Z515 Encounter for palliative care: Secondary | ICD-10-CM | POA: Diagnosis not present

## 2020-12-18 DIAGNOSIS — R059 Cough, unspecified: Secondary | ICD-10-CM | POA: Diagnosis not present

## 2020-12-18 DIAGNOSIS — U071 COVID-19: Secondary | ICD-10-CM | POA: Diagnosis not present

## 2020-12-18 DIAGNOSIS — R2689 Other abnormalities of gait and mobility: Secondary | ICD-10-CM | POA: Diagnosis not present

## 2020-12-18 DIAGNOSIS — J1282 Pneumonia due to coronavirus disease 2019: Secondary | ICD-10-CM | POA: Diagnosis not present

## 2020-12-18 DIAGNOSIS — N179 Acute kidney failure, unspecified: Secondary | ICD-10-CM | POA: Diagnosis not present

## 2020-12-18 DIAGNOSIS — R54 Age-related physical debility: Secondary | ICD-10-CM | POA: Diagnosis present

## 2020-12-18 DIAGNOSIS — I4821 Permanent atrial fibrillation: Secondary | ICD-10-CM | POA: Diagnosis not present

## 2020-12-18 DIAGNOSIS — R531 Weakness: Secondary | ICD-10-CM | POA: Diagnosis not present

## 2020-12-18 DIAGNOSIS — I259 Chronic ischemic heart disease, unspecified: Secondary | ICD-10-CM | POA: Diagnosis not present

## 2020-12-18 DIAGNOSIS — J90 Pleural effusion, not elsewhere classified: Secondary | ICD-10-CM | POA: Diagnosis not present

## 2020-12-18 DIAGNOSIS — I4891 Unspecified atrial fibrillation: Secondary | ICD-10-CM | POA: Diagnosis not present

## 2020-12-18 DIAGNOSIS — I517 Cardiomegaly: Secondary | ICD-10-CM | POA: Diagnosis not present

## 2020-12-18 DIAGNOSIS — I472 Ventricular tachycardia: Secondary | ICD-10-CM | POA: Diagnosis not present

## 2020-12-18 DIAGNOSIS — R410 Disorientation, unspecified: Secondary | ICD-10-CM | POA: Diagnosis not present

## 2020-12-18 DIAGNOSIS — K219 Gastro-esophageal reflux disease without esophagitis: Secondary | ICD-10-CM | POA: Diagnosis not present

## 2020-12-18 DIAGNOSIS — I2510 Atherosclerotic heart disease of native coronary artery without angina pectoris: Secondary | ICD-10-CM | POA: Diagnosis not present

## 2020-12-18 DIAGNOSIS — I255 Ischemic cardiomyopathy: Secondary | ICD-10-CM | POA: Diagnosis not present

## 2020-12-18 DIAGNOSIS — Z7189 Other specified counseling: Secondary | ICD-10-CM | POA: Diagnosis not present

## 2020-12-18 DIAGNOSIS — Z85038 Personal history of other malignant neoplasm of large intestine: Secondary | ICD-10-CM | POA: Diagnosis not present

## 2020-12-24 ENCOUNTER — Ambulatory Visit: Payer: Medicare HMO | Admitting: Gastroenterology

## 2020-12-24 DIAGNOSIS — I4821 Permanent atrial fibrillation: Secondary | ICD-10-CM | POA: Diagnosis not present

## 2020-12-24 DIAGNOSIS — I5023 Acute on chronic systolic (congestive) heart failure: Secondary | ICD-10-CM | POA: Diagnosis not present

## 2020-12-24 DIAGNOSIS — I472 Ventricular tachycardia: Secondary | ICD-10-CM | POA: Diagnosis not present

## 2020-12-24 DIAGNOSIS — I10 Essential (primary) hypertension: Secondary | ICD-10-CM | POA: Diagnosis not present

## 2020-12-24 NOTE — Progress Notes (Signed)
Cardiology Office Note Date:  12/24/2020  Patient ID:  [name] [name], [name] [name] 12-Aug-1932, MRN [id number] PCP:  Rodrigo Ran, MD  Cardiologist:  Dr. [place] EP: Dr. Johney Frame   Chief Complaint:  post hospital   History of Present Illness: [name] [name] is a 87 y.o. male with history of CAD ( CABG 2002), Afib (not on a/c 2/2 GIB characterized as "major", by cardiology note and recurrent), ICM, Chronic CHF (systolic), ICD >> PPM, HTN, HLD.  His AFib described as permanent and rate controlled., not a candidate for watchman or a/c 2/2 GIBs  I note that in 2016 when his ICD was nearing ERI, was discussed given no V arrhythmias, an in appropriate therapy for AF only, LVEF improved to 40%,  and advanced age, though did have 11% V pacing, decided to replace his device with PPM.  He was hospitalized 10/23/20 with progressive and generalized weakness, lethargy, fever, found in WCT, hypotensive, febrile in the ER, anemia, GIB, and COVID +, and acute urinary retension He as cardioverted in the ER to AFib, started on amiodarone, cardiology team followed eventually resumed his BB and signed off case 10/29/20. Received steriods and Remdesivir, HGB remained stable, + melena with no overt bleeding, , discharged   TTE done 10/25/20, noted LVEF 25-30%, global hypokineis, , also mentions severe akinesis basal-mid inferior, inferolateral walls RVEF mod reduced, mildly enlarged, severe elevated pulm pressures, 74.9 LA massively dilated Severe MR Mod TR Full report in the chart Dig was resumed by cards in the hospital given his echo.  He was admitted 12/13/20 after syncopal event at home, EMS was called, lethargic, though improved in their prsence, in the ER developed VT while being prepped for cardioversion had spontaneous conversion. Started on amidarone.  Labs without any significant abnormalities, outside of a mag level 1.4  He was seen/admitted by EP, Dr. Elberta Fortis, started on amiodarone gtt, planned for diuresis,  n o CP and no significant HS trop, not felt to be ischemic Dr. Elberta Fortis noted Device his interrogation showed ventricular tachycardia this morning.   Hospital Course:  While in the ER he had recurrent VT and started lidocaine gtt.  He was transitioned to PO amiodarone and lidocaine gtt stopped though developed an up tick in NSVT WCT and amiodarone gtt resumed another day.  He was diuresed, his amiodarone transitioned to PO again since he had has had none further.  His Afib rate controlled with some V pacing as well. Not felt an ICD candidate with advanced age. PT recomended SNF/rehab. During his stay he had intermittent confusion Discharged 12/17/20 back on his home meds + amiodarone and low dose mag  TODAY He comes today with his daughter. He is doing very well at rehab!  His daughter says this facility has been so much better and that he has had steady progress in strength and stability. He has cleared up well also with no ongping confusion.  He denies any CP, palpitations or cardiac awareness. No near syncope or syncope, no dizzy spells. His daughter confirms, she has not heard him c/o anything or reported by the staff.  No SOB, denies DOE  Looks like his dig was stopped since discharge   Device information SJM ICD >>> dual chamber PPM at time of generator  Change 09/2015 Leads are MDT RV lead is a 6947 (both RA and RV are from 2009)  AAD Amiodarone very remotely, resumed during hospital stay with GIB, COVID for WCT and stopped at discharge >> resumed with recurrent  VT Jan 2022   Past Medical History:  Diagnosis Date  . CAD (coronary artery disease)    Old inferolateral MI s/p CABG in 2002  . CAP (community acquired pneumonia) 10/03/2011  . Cardiomyopathy    s/p ICD 09/30/08; EF 35 to 40% per echo in October 2021  . Chronic systolic CHF (congestive heart failure) (HCC) 07/24/2013  . Colon cancer (HCC)    s/p chemo, RT, sugery  . Colorectal anastomotic stricture   .  Diverticulosis   . ED (erectile dysfunction)   . GERD (gastroesophageal reflux disease)    rarely  . HTN (hypertension)   . ICD (implantable cardiac defibrillator) discharge October 2012   removed 2016    now only PPM  . Iron deficiency anemia   . Paroxysmal atrial fibrillation (HCC)    controlled with amiodarone, not a coumadin candidate due to GI bleeding  . V tach (HCC) 12/14/2020    Past Surgical History:  Procedure Laterality Date  . APPENDECTOMY    . BACK SURGERY    . BIOPSY  09/22/2018   Procedure: BIOPSY;  Surgeon: Iva Boop, MD;  Location: [facility];  Service: Endoscopy;;  . CARDIAC CATHETERIZATION  11/07/2000   EF 32%  . CARDIAC DEFIBRILLATOR PLACEMENT  09/30/08   MDT dual chamber ICD placed by Dr Johney Frame  . CARDIOVASCULAR STRESS TEST  08/06/2008   EF 30%  . CHOLECYSTECTOMY    . COLON RESECTION     x2  . COLONOSCOPY     multiple  . COLONOSCOPY N/A 09/22/2018   Procedure: COLONOSCOPY;  Surgeon: Iva Boop, MD;  Location: [facility];  Service: Endoscopy;  Laterality: N/A;  . CORONARY ARTERY BYPASS GRAFT    . EP IMPLANTABLE DEVICE N/A 10/15/2015   Procedure:  PPM Generator Changeout;  Surgeon: Hillis Range, MD;  Location: [facility];  Service: Cardiovascular;  Laterality: N/A;  . HERNIA REPAIR    . US ECHOCARDIOGRAPHY  08/11/2008   EF 30-35%  . VASECTOMY      Current Outpatient Medications  Medication Sig Dispense Refill  . amiodarone (PACERONE) 200 MG tablet Take 2 tablets (400 mg total) by mouth 2 (two) times daily. START with 400mg  PO BID for 10 days THEN reduce to 400mg  PO daily for 10 days THEN reduce to 200mg  PO daily    . BESIVANCE 0.6 % SUSP Place 1 drop into the right eye 3 (three) times daily.  1  . cholecalciferol (VITAMIN D) 1000 UNITS tablet Take 1,000 Units by mouth daily.    . digoxin 62.5 MCG TABS Take 0.0625 mg by mouth every other day.    . diphenoxylate-atropine (LOMOTIL) 2.5-0.025 MG tablet Take 1 tablet by mouth 4 (four)  times daily as needed for diarrhea or loose stools.     . DUREZOL 0.05 % EMUL Place 1 drop into the right eye 3 (three) times daily.  1  . furosemide (LASIX) 40 MG tablet Take 1 tablet (40 mg total) by mouth daily. 90 tablet 3  . Magnesium Oxide 200 MG TABS Take 1 tablet (200 mg total) by mouth daily.  0  . metoprolol succinate (TOPROL-XL) 100 MG 24 hr tablet TAKE 1 TABLET (100 MG TOTAL) BY MOUTH DAILY WITH OR IMMEDIATELY FOLLOWING A MEAL (Patient taking differently: Take 100 mg by mouth daily.) 90 tablet 3  . Multiple Vitamins-Minerals (MULTIVITAMINS THER. W/MINERALS) TABS Take 1 tablet by mouth daily.    . nitroGLYCERIN (NITROSTAT) 0.4 MG SL tablet Place 1 tablet (0.4 mg  total) under the tongue every 5 (five) minutes as needed for chest pain. 25 tablet 6  . pantoprazole (PROTONIX) 40 MG tablet Take 1 tablet (40 mg total) by mouth 2 (two) times daily.    Marland Kitchen PROAIR HFA 108 (90 BASE) MCG/ACT inhaler Inhale 2 puffs into the lungs every 4 (four) hours as needed for shortness of breath.     . ramipril (ALTACE) 2.5 MG capsule Take 1 capsule (2.5 mg total) by mouth daily.    Marland Kitchen spironolactone (ALDACTONE) 25 MG tablet TAKE 1/2 TABLET EVERY DAY (NEED MD APPOINTMENT) (Patient taking differently: Take 12.5 mg by mouth daily.) 90 tablet 3  . tamsulosin (FLOMAX) 0.4 MG CAPS capsule Take 1 capsule (0.4 mg total) by mouth daily. 30 capsule 0  . vitamin B-12 (CYANOCOBALAMIN) 1000 MCG tablet Take 1,000 mcg by mouth daily.     No current facility-administered medications for this visit.    Allergies:   Iron, Lescol [fluvastatin sodium], Pentazocine lactate, Vytorin [ezetimibe-simvastatin], and Aspirin   Social History:  The patient  reports that he has quit smoking. His smoking use included cigars. He has never used smokeless tobacco. He reports that he does not drink alcohol and does not use drugs.   Family History:  The patient's family history includes Colon cancer in his father; Colon polyps in his brother;  Diabetes in his sister; Pancreatic cancer (age of onset: 75) in his brother.  ROS:  Please see the history of present illness.  All other systems are reviewed and otherwise negative.   PHYSICAL EXAM:  VS:  There were no vitals taken for this visit. BMI: There is no height or weight on file to calculate BMI. Well nourished,thin, chronically ill appearing,  in no acute distress  HEENT: normocephalic, atraumatic  Neck: no JVD, carotid bruits or masses Cardiac:   irreg-irreg; no significant murmurs, no rubs, or gallops Lungs: CTA b/l, no wheezing, rhonchi or rales  Abd: soft, nontender MS: no deformity, age appropriate, perhaps advanced atrophy Ext: trace- 1+ edema to just above the ankle chronic looking skin changes Skin: warm and dry, no rash Neuro:  No gross deficits appreciated Psych: euthymic mood, full affect  PPM site is stable, no tethering or discomfort, no erosion   EKG:  Not done today  PPM interrogation done today and reviewed by myself: Battery and lead measurements are good No new HVR episodes cybersecurity update is completed   10/25/2020: TTE IMPRESSIONS  1. Left ventricular ejection fraction, by estimation, is 25 to 30%. The  left ventricle has severely decreased function. The left ventricle  demonstrates global hypokinesis. Left ventricular diastolic function could  not be evaluated. There is severe  akinesis of the left ventricular, basal-mid inferior and inferolateral  walls. Incoordinate (paced) septal motion.  2. Right ventricular systolic function is moderately reduced. The right  ventricular size is mildly enlarged. There is severely elevated pulmonary  artery systolic pressure. The estimated right ventricular systolic  pressure is 74.9 mmHg.  3. Left atrial size was massively dilated.  4. Right atrial size was moderately dilated.  5. Mild mitral subvalvular calcification.  6. Mild mitral subvalvular thickening/fibrosis.  7. Tethering of the  mitral valve posterior leaflet.  8. The mitral valve is abnormal. Severe mitral valve regurgitation.  9. The tricuspid valve is abnormal. Tricuspid valve regurgitation is  moderate.  10. The aortic valve is tricuspid. Aortic valve regurgitation is mild.  Mild aortic valve sclerosis is present, with no evidence of aortic valve  stenosis.  11.  Moderately dilated pulmonary artery.  12. The inferior vena cava is dilated in size with <50% respiratory  variability, suggesting right atrial pressure of 15 mmHg.   Comparison(s): Changes from prior study are noted. 01/04/2016: LVEF 40-45%,  pacer wire, mild AI, moderate MR, moderate LAE, mild RAE.    01/04/2016: TTE Study Conclusions  - Left ventricle: Inferior wall hypokinesis The cavity size was  moderately dilated. Wall thickness was increased in a pattern of  mild LVH. Systolic function was mildly to moderately reduced. The  estimated ejection fraction was in the range of 40% to 45%.  - Aortic valve: There was mild regurgitation.  - Mitral valve: There was moderate regurgitation.  - Left atrium: The atrium was moderately dilated.  - Right ventricle: The cavity size was mildly dilated.  - Right atrium: The atrium was mildly dilated.  - Atrial septum: No defect or patent foramen ovale was identified.  - Pulmonary arteries: PA peak pressure: 71 mm Hg (S).    Recent Labs: 12/13/2020: ALT 18; B Natriuretic Peptide 436.0; Hemoglobin 9.3; Platelets 284 12/16/2020: Magnesium 2.0 12/17/2020: BUN 24; Creatinine, Ser 1.42; Potassium 4.1; Sodium 136  No results found for requested labs within last 8760 hours.   Estimated Creatinine Clearance: 31.5 mL/min (A) (by C-G formula based on SCr of 1.42 mg/dL (H)).   Wt Readings from Last 3 Encounters:  12/17/20 131 lb 9.8 oz (59.7 kg)  12/10/20 130 lb (59 kg)  10/23/20 136 lb 11 oz (62 kg)     Other studies reviewed: Additional studies/records reviewed today include: summarized  above  ASSESSMENT AND PLAN:  1. PPM     Stable measurements     cyber security update is completed  2. ICM 3. Chronic CHF     Recent exacerbation     No SOB, lungs are clear     On BB/ACE, diurtetic tx     Trace edema  4. CAD     No anginal complaints     On BB, no ASA 2/2 GIBs     Following with Dr. Anola Gurney  5. HTN    Relative hypotension with medicines    Recheck by myself was 98/60, his daughter reports this is his usual but has been a little higher at rehab    No changes    Have asked we be notified if persistent SBP ,100  6. Permanent AFib    CHA2DS2Vasc is  6, not on a/c 2/2 recurrent GIB history     rate controlled  7. VT     Initially occurred in the environment of febrile illness, COVID     reduction in LVEF, severe p.HTN, MR during his acute illness     Recurrent VT Jan 2022     None since      Not an ICD candidate     Amiodarone     Labs today     Down-titration schedule is written out for his rehab facility     12/28/20 reduce to 400mg  daily     01/07/21 reduce to 200mg  daily   Disposition: will have him back in 2 mo, sooner if needed.    Current medicines are reviewed at length with the patient today.  The patient did not have any concerns regarding medicines.  Norma Fredrickson, PA-C 12/24/2020 7:08 AM     [facility] [address] [place] [address] [phone] (office)  [phone] (fax)

## 2020-12-25 ENCOUNTER — Encounter: Payer: Self-pay | Admitting: Physician Assistant

## 2020-12-25 ENCOUNTER — Ambulatory Visit: Payer: Medicare HMO | Admitting: Physician Assistant

## 2020-12-25 ENCOUNTER — Other Ambulatory Visit: Payer: Self-pay

## 2020-12-25 VITALS — BP 94/56 | HR 64 | Ht 70.0 in | Wt 126.0 lb

## 2020-12-25 DIAGNOSIS — Z950 Presence of cardiac pacemaker: Secondary | ICD-10-CM

## 2020-12-25 DIAGNOSIS — I472 Ventricular tachycardia, unspecified: Secondary | ICD-10-CM

## 2020-12-25 DIAGNOSIS — I4821 Permanent atrial fibrillation: Secondary | ICD-10-CM | POA: Diagnosis not present

## 2020-12-25 DIAGNOSIS — I5023 Acute on chronic systolic (congestive) heart failure: Secondary | ICD-10-CM | POA: Diagnosis not present

## 2020-12-25 DIAGNOSIS — I2510 Atherosclerotic heart disease of native coronary artery without angina pectoris: Secondary | ICD-10-CM | POA: Diagnosis not present

## 2020-12-25 LAB — CUP PACEART INCLINIC DEVICE CHECK
Battery Remaining Longevity: 135 mo
Battery Voltage: 3.02 V
Brady Statistic RV Percent Paced: 17 %
Date Time Interrogation Session: 20220128090543
Implantable Lead Implant Date: 20091103
Implantable Lead Implant Date: 20091103
Implantable Lead Location: 753859
Implantable Lead Location: 753860
Implantable Lead Model: 5076
Implantable Lead Model: 6947
Implantable Pulse Generator Implant Date: 20161117
Lead Channel Impedance Value: 375 Ohm
Lead Channel Pacing Threshold Amplitude: 1 V
Lead Channel Pacing Threshold Amplitude: 1 V
Lead Channel Pacing Threshold Pulse Width: 0.4 ms
Lead Channel Pacing Threshold Pulse Width: 0.4 ms
Lead Channel Sensing Intrinsic Amplitude: 12 mV
Lead Channel Setting Pacing Amplitude: 2.5 V
Lead Channel Setting Pacing Pulse Width: 0.4 ms
Lead Channel Setting Sensing Sensitivity: 2 mV
Pulse Gen Model: 1240
Pulse Gen Serial Number: 7804513

## 2020-12-25 LAB — COMPREHENSIVE METABOLIC PANEL
ALT: 10 IU/L (ref 0–44)
AST: 17 IU/L (ref 0–40)
Albumin/Globulin Ratio: 1.5 (ref 1.2–2.2)
Albumin: 3.5 g/dL — ABNORMAL LOW (ref 3.6–4.6)
Alkaline Phosphatase: 48 IU/L (ref 44–121)
BUN/Creatinine Ratio: 15 (ref 10–24)
BUN: 21 mg/dL (ref 8–27)
Bilirubin Total: 0.8 mg/dL (ref 0.0–1.2)
CO2: 27 mmol/L (ref 20–29)
Calcium: 8.9 mg/dL (ref 8.6–10.2)
Chloride: 100 mmol/L (ref 96–106)
Creatinine, Ser: 1.44 mg/dL — ABNORMAL HIGH (ref 0.76–1.27)
GFR calc Af Amer: 50 mL/min/{1.73_m2} — ABNORMAL LOW (ref >59)
GFR calc non Af Amer: 44 mL/min/{1.73_m2} — ABNORMAL LOW (ref >59)
Globulin, Total: 2.4 g/dL (ref 1.5–4.5)
Glucose: 98 mg/dL (ref 65–99)
Potassium: 3.6 mmol/L (ref 3.5–5.2)
Sodium: 139 mmol/L (ref 134–144)
Total Protein: 5.9 g/dL — ABNORMAL LOW (ref 6.0–8.5)

## 2020-12-25 LAB — TSH: TSH: 5.59 u[IU]/mL — ABNORMAL HIGH (ref 0.450–4.500)

## 2020-12-25 LAB — MAGNESIUM: Magnesium: 1.9 mg/dL (ref 1.6–2.3)

## 2020-12-25 NOTE — Patient Instructions (Addendum)
Medication Instructions:   Your physician recommends that you continue on your current medications as directed. Please refer to the Current Medication list given to you today.]  *If you need a refill on your cardiac medications before your next appointment, please call your pharmacy*   Lab Work: CMET MAG AMD TSH TODAY   If you have labs (blood work) drawn today and your tests are completely normal, you will receive your results only by: Marland Kitchen MyChart Message (if you have MyChart) OR . A paper copy in the mail If you have any lab test that is abnormal or we need to change your treatment, we will call you to review the results.   Testing/Procedures: NONE ORDERED  TODAY     Follow-Up: At [facility], you and your health needs are our priority.  As part of our continuing mission to provide you with exceptional heart care, we have created designated Provider Care Teams.  These Care Teams include your primary Cardiologist (physician) and Advanced Practice Providers (APPs -  Physician Assistants and Nurse Practitioners) who all work together to provide you with the care you need, when you need it.  We recommend signing up for the patient portal called "MyChart".  Sign up information is provided on this After Visit Summary.  MyChart is used to connect with patients for Virtual Visits (Telemedicine).  Patients are able to view lab/test results, encounter notes, upcoming appointments, etc.  Non-urgent messages can be sent to your provider as well.   To learn more about what you can do with MyChart, go to [email or web address].    Your next appointment:   1 month(s)  The format for your next appointment:   In Person  Provider:    You may see Tommye Standard, PA-C     Other Instructions

## 2020-12-28 ENCOUNTER — Ambulatory Visit: Payer: Medicare HMO | Admitting: Cardiology

## 2020-12-29 DIAGNOSIS — E860 Dehydration: Secondary | ICD-10-CM | POA: Diagnosis present

## 2020-12-29 DIAGNOSIS — F411 Generalized anxiety disorder: Secondary | ICD-10-CM | POA: Diagnosis not present

## 2020-12-29 DIAGNOSIS — I959 Hypotension, unspecified: Secondary | ICD-10-CM | POA: Diagnosis not present

## 2020-12-29 DIAGNOSIS — M6281 Muscle weakness (generalized): Secondary | ICD-10-CM | POA: Diagnosis not present

## 2020-12-29 DIAGNOSIS — I5023 Acute on chronic systolic (congestive) heart failure: Secondary | ICD-10-CM | POA: Diagnosis not present

## 2020-12-29 DIAGNOSIS — R338 Other retention of urine: Secondary | ICD-10-CM | POA: Diagnosis present

## 2020-12-29 DIAGNOSIS — R4182 Altered mental status, unspecified: Secondary | ICD-10-CM | POA: Diagnosis not present

## 2020-12-29 DIAGNOSIS — I5022 Chronic systolic (congestive) heart failure: Secondary | ICD-10-CM | POA: Diagnosis not present

## 2020-12-29 DIAGNOSIS — Z66 Do not resuscitate: Secondary | ICD-10-CM | POA: Diagnosis present

## 2020-12-29 DIAGNOSIS — I517 Cardiomegaly: Secondary | ICD-10-CM | POA: Diagnosis not present

## 2020-12-29 DIAGNOSIS — Z95810 Presence of automatic (implantable) cardiac defibrillator: Secondary | ICD-10-CM | POA: Diagnosis not present

## 2020-12-29 DIAGNOSIS — E039 Hypothyroidism, unspecified: Secondary | ICD-10-CM | POA: Diagnosis present

## 2020-12-29 DIAGNOSIS — I2510 Atherosclerotic heart disease of native coronary artery without angina pectoris: Secondary | ICD-10-CM | POA: Diagnosis present

## 2020-12-29 DIAGNOSIS — N184 Chronic kidney disease, stage 4 (severe): Secondary | ICD-10-CM | POA: Diagnosis present

## 2020-12-29 DIAGNOSIS — I259 Chronic ischemic heart disease, unspecified: Secondary | ICD-10-CM | POA: Diagnosis not present

## 2020-12-29 DIAGNOSIS — R197 Diarrhea, unspecified: Secondary | ICD-10-CM | POA: Diagnosis not present

## 2020-12-29 DIAGNOSIS — Z8616 Personal history of COVID-19: Secondary | ICD-10-CM | POA: Diagnosis not present

## 2020-12-29 DIAGNOSIS — I130 Hypertensive heart and chronic kidney disease with heart failure and stage 1 through stage 4 chronic kidney disease, or unspecified chronic kidney disease: Secondary | ICD-10-CM | POA: Diagnosis present

## 2020-12-29 DIAGNOSIS — I4821 Permanent atrial fibrillation: Secondary | ICD-10-CM | POA: Diagnosis not present

## 2020-12-29 DIAGNOSIS — N179 Acute kidney failure, unspecified: Secondary | ICD-10-CM | POA: Diagnosis present

## 2020-12-29 DIAGNOSIS — I472 Ventricular tachycardia: Secondary | ICD-10-CM | POA: Diagnosis present

## 2020-12-29 DIAGNOSIS — K219 Gastro-esophageal reflux disease without esophagitis: Secondary | ICD-10-CM | POA: Diagnosis not present

## 2020-12-29 DIAGNOSIS — Z515 Encounter for palliative care: Secondary | ICD-10-CM | POA: Diagnosis not present

## 2020-12-29 DIAGNOSIS — R262 Difficulty in walking, not elsewhere classified: Secondary | ICD-10-CM | POA: Diagnosis not present

## 2020-12-29 DIAGNOSIS — Z951 Presence of aortocoronary bypass graft: Secondary | ICD-10-CM | POA: Diagnosis not present

## 2020-12-29 DIAGNOSIS — J1282 Pneumonia due to coronavirus disease 2019: Secondary | ICD-10-CM | POA: Diagnosis present

## 2020-12-29 DIAGNOSIS — R109 Unspecified abdominal pain: Secondary | ICD-10-CM | POA: Diagnosis not present

## 2020-12-29 DIAGNOSIS — E861 Hypovolemia: Secondary | ICD-10-CM | POA: Diagnosis not present

## 2020-12-29 DIAGNOSIS — R531 Weakness: Secondary | ICD-10-CM | POA: Diagnosis not present

## 2020-12-29 DIAGNOSIS — Z950 Presence of cardiac pacemaker: Secondary | ICD-10-CM | POA: Diagnosis not present

## 2020-12-29 DIAGNOSIS — K529 Noninfective gastroenteritis and colitis, unspecified: Secondary | ICD-10-CM | POA: Diagnosis not present

## 2020-12-29 DIAGNOSIS — R410 Disorientation, unspecified: Secondary | ICD-10-CM | POA: Diagnosis not present

## 2020-12-29 DIAGNOSIS — E785 Hyperlipidemia, unspecified: Secondary | ICD-10-CM | POA: Diagnosis present

## 2020-12-29 DIAGNOSIS — U071 COVID-19: Secondary | ICD-10-CM | POA: Diagnosis present

## 2020-12-29 DIAGNOSIS — Z85038 Personal history of other malignant neoplasm of large intestine: Secondary | ICD-10-CM | POA: Diagnosis not present

## 2020-12-29 DIAGNOSIS — I255 Ischemic cardiomyopathy: Secondary | ICD-10-CM | POA: Diagnosis not present

## 2020-12-29 DIAGNOSIS — R54 Age-related physical debility: Secondary | ICD-10-CM | POA: Diagnosis present

## 2020-12-29 DIAGNOSIS — I10 Essential (primary) hypertension: Secondary | ICD-10-CM | POA: Diagnosis not present

## 2020-12-29 DIAGNOSIS — W19XXXA Unspecified fall, initial encounter: Secondary | ICD-10-CM | POA: Diagnosis not present

## 2020-12-29 DIAGNOSIS — G929 Unspecified toxic encephalopathy: Secondary | ICD-10-CM | POA: Diagnosis present

## 2020-12-29 DIAGNOSIS — I9589 Other hypotension: Secondary | ICD-10-CM | POA: Diagnosis present

## 2020-12-29 DIAGNOSIS — N401 Enlarged prostate with lower urinary tract symptoms: Secondary | ICD-10-CM | POA: Diagnosis present

## 2020-12-29 DIAGNOSIS — R2689 Other abnormalities of gait and mobility: Secondary | ICD-10-CM | POA: Diagnosis not present

## 2020-12-29 DIAGNOSIS — A0839 Other viral enteritis: Secondary | ICD-10-CM | POA: Diagnosis present

## 2020-12-29 DIAGNOSIS — Z7189 Other specified counseling: Secondary | ICD-10-CM | POA: Diagnosis not present

## 2020-12-29 DIAGNOSIS — I4891 Unspecified atrial fibrillation: Secondary | ICD-10-CM | POA: Diagnosis not present

## 2020-12-29 DIAGNOSIS — J90 Pleural effusion, not elsewhere classified: Secondary | ICD-10-CM | POA: Diagnosis not present

## 2020-12-29 DIAGNOSIS — R059 Cough, unspecified: Secondary | ICD-10-CM | POA: Diagnosis not present

## 2021-01-04 DIAGNOSIS — U071 COVID-19: Secondary | ICD-10-CM | POA: Diagnosis not present

## 2021-01-04 DIAGNOSIS — R262 Difficulty in walking, not elsewhere classified: Secondary | ICD-10-CM | POA: Diagnosis not present

## 2021-01-04 DIAGNOSIS — R2689 Other abnormalities of gait and mobility: Secondary | ICD-10-CM | POA: Diagnosis not present

## 2021-01-05 ENCOUNTER — Other Ambulatory Visit: Payer: Self-pay | Admitting: Physician Assistant

## 2021-01-06 DIAGNOSIS — R059 Cough, unspecified: Secondary | ICD-10-CM | POA: Diagnosis not present

## 2021-01-06 DIAGNOSIS — Z8616 Personal history of COVID-19: Secondary | ICD-10-CM | POA: Diagnosis not present

## 2021-01-06 DIAGNOSIS — I4821 Permanent atrial fibrillation: Secondary | ICD-10-CM | POA: Diagnosis not present

## 2021-01-06 DIAGNOSIS — I5022 Chronic systolic (congestive) heart failure: Secondary | ICD-10-CM | POA: Diagnosis not present

## 2021-01-08 DIAGNOSIS — F411 Generalized anxiety disorder: Secondary | ICD-10-CM | POA: Diagnosis not present

## 2021-01-08 DIAGNOSIS — R410 Disorientation, unspecified: Secondary | ICD-10-CM | POA: Diagnosis not present

## 2021-01-08 DIAGNOSIS — W19XXXA Unspecified fall, initial encounter: Secondary | ICD-10-CM | POA: Diagnosis not present

## 2021-01-08 DIAGNOSIS — R531 Weakness: Secondary | ICD-10-CM | POA: Diagnosis not present

## 2021-01-10 ENCOUNTER — Emergency Department (HOSPITAL_COMMUNITY): Payer: Medicare HMO

## 2021-01-10 ENCOUNTER — Inpatient Hospital Stay (HOSPITAL_COMMUNITY)
Admission: EM | Admit: 2021-01-10 | Discharge: 2021-01-15 | DRG: 177 | Disposition: A | Discharge status: Hospice | Payer: Medicare HMO | Source: Skilled Nursing Facility | Attending: Internal Medicine | Admitting: Internal Medicine

## 2021-01-10 DIAGNOSIS — N401 Enlarged prostate with lower urinary tract symptoms: Secondary | ICD-10-CM | POA: Diagnosis present

## 2021-01-10 DIAGNOSIS — R112 Nausea with vomiting, unspecified: Secondary | ICD-10-CM

## 2021-01-10 DIAGNOSIS — E876 Hypokalemia: Secondary | ICD-10-CM | POA: Diagnosis present

## 2021-01-10 DIAGNOSIS — N184 Chronic kidney disease, stage 4 (severe): Secondary | ICD-10-CM | POA: Diagnosis present

## 2021-01-10 DIAGNOSIS — Z7401 Bed confinement status: Secondary | ICD-10-CM | POA: Diagnosis not present

## 2021-01-10 DIAGNOSIS — Z8371 Family history of colonic polyps: Secondary | ICD-10-CM

## 2021-01-10 DIAGNOSIS — I959 Hypotension, unspecified: Secondary | ICD-10-CM | POA: Diagnosis present

## 2021-01-10 DIAGNOSIS — I517 Cardiomegaly: Secondary | ICD-10-CM | POA: Diagnosis not present

## 2021-01-10 DIAGNOSIS — E860 Dehydration: Secondary | ICD-10-CM | POA: Diagnosis present

## 2021-01-10 DIAGNOSIS — Z85038 Personal history of other malignant neoplasm of large intestine: Secondary | ICD-10-CM | POA: Diagnosis not present

## 2021-01-10 DIAGNOSIS — M419 Scoliosis, unspecified: Secondary | ICD-10-CM | POA: Diagnosis not present

## 2021-01-10 DIAGNOSIS — I255 Ischemic cardiomyopathy: Secondary | ICD-10-CM | POA: Diagnosis present

## 2021-01-10 DIAGNOSIS — R2689 Other abnormalities of gait and mobility: Secondary | ICD-10-CM | POA: Diagnosis not present

## 2021-01-10 DIAGNOSIS — R531 Weakness: Secondary | ICD-10-CM | POA: Diagnosis not present

## 2021-01-10 DIAGNOSIS — R197 Diarrhea, unspecified: Secondary | ICD-10-CM | POA: Diagnosis not present

## 2021-01-10 DIAGNOSIS — R0602 Shortness of breath: Secondary | ICD-10-CM | POA: Diagnosis present

## 2021-01-10 DIAGNOSIS — I4821 Permanent atrial fibrillation: Secondary | ICD-10-CM | POA: Diagnosis present

## 2021-01-10 DIAGNOSIS — E861 Hypovolemia: Secondary | ICD-10-CM | POA: Diagnosis present

## 2021-01-10 DIAGNOSIS — A0839 Other viral enteritis: Secondary | ICD-10-CM | POA: Diagnosis present

## 2021-01-10 DIAGNOSIS — I5022 Chronic systolic (congestive) heart failure: Secondary | ICD-10-CM | POA: Diagnosis present

## 2021-01-10 DIAGNOSIS — R4182 Altered mental status, unspecified: Secondary | ICD-10-CM | POA: Diagnosis not present

## 2021-01-10 DIAGNOSIS — U071 COVID-19: Secondary | ICD-10-CM | POA: Diagnosis present

## 2021-01-10 DIAGNOSIS — Z66 Do not resuscitate: Secondary | ICD-10-CM | POA: Diagnosis present

## 2021-01-10 DIAGNOSIS — R338 Other retention of urine: Secondary | ICD-10-CM | POA: Diagnosis present

## 2021-01-10 DIAGNOSIS — R54 Age-related physical debility: Secondary | ICD-10-CM | POA: Diagnosis present

## 2021-01-10 DIAGNOSIS — I2510 Atherosclerotic heart disease of native coronary artery without angina pectoris: Secondary | ICD-10-CM | POA: Diagnosis present

## 2021-01-10 DIAGNOSIS — Z950 Presence of cardiac pacemaker: Secondary | ICD-10-CM | POA: Diagnosis not present

## 2021-01-10 DIAGNOSIS — I252 Old myocardial infarction: Secondary | ICD-10-CM

## 2021-01-10 DIAGNOSIS — Z833 Family history of diabetes mellitus: Secondary | ICD-10-CM

## 2021-01-10 DIAGNOSIS — I9589 Other hypotension: Secondary | ICD-10-CM | POA: Diagnosis present

## 2021-01-10 DIAGNOSIS — Z515 Encounter for palliative care: Secondary | ICD-10-CM

## 2021-01-10 DIAGNOSIS — Z7189 Other specified counseling: Secondary | ICD-10-CM | POA: Diagnosis not present

## 2021-01-10 DIAGNOSIS — R109 Unspecified abdominal pain: Secondary | ICD-10-CM | POA: Diagnosis not present

## 2021-01-10 DIAGNOSIS — J1282 Pneumonia due to coronavirus disease 2019: Secondary | ICD-10-CM | POA: Diagnosis present

## 2021-01-10 DIAGNOSIS — M6281 Muscle weakness (generalized): Secondary | ICD-10-CM | POA: Diagnosis not present

## 2021-01-10 DIAGNOSIS — Z951 Presence of aortocoronary bypass graft: Secondary | ICD-10-CM

## 2021-01-10 DIAGNOSIS — I5023 Acute on chronic systolic (congestive) heart failure: Secondary | ICD-10-CM | POA: Diagnosis present

## 2021-01-10 DIAGNOSIS — R5381 Other malaise: Secondary | ICD-10-CM | POA: Diagnosis not present

## 2021-01-10 DIAGNOSIS — G929 Unspecified toxic encephalopathy: Secondary | ICD-10-CM | POA: Diagnosis present

## 2021-01-10 DIAGNOSIS — Z87891 Personal history of nicotine dependence: Secondary | ICD-10-CM

## 2021-01-10 DIAGNOSIS — E785 Hyperlipidemia, unspecified: Secondary | ICD-10-CM | POA: Diagnosis present

## 2021-01-10 DIAGNOSIS — Z8616 Personal history of COVID-19: Secondary | ICD-10-CM

## 2021-01-10 DIAGNOSIS — J90 Pleural effusion, not elsewhere classified: Secondary | ICD-10-CM | POA: Diagnosis not present

## 2021-01-10 DIAGNOSIS — N179 Acute kidney failure, unspecified: Secondary | ICD-10-CM | POA: Diagnosis present

## 2021-01-10 DIAGNOSIS — I4891 Unspecified atrial fibrillation: Secondary | ICD-10-CM | POA: Diagnosis not present

## 2021-01-10 DIAGNOSIS — Z800 Family history of malignant neoplasm of digestive organs: Secondary | ICD-10-CM

## 2021-01-10 DIAGNOSIS — I472 Ventricular tachycardia: Secondary | ICD-10-CM | POA: Diagnosis present

## 2021-01-10 DIAGNOSIS — K219 Gastro-esophageal reflux disease without esophagitis: Secondary | ICD-10-CM | POA: Diagnosis present

## 2021-01-10 DIAGNOSIS — K529 Noninfective gastroenteritis and colitis, unspecified: Secondary | ICD-10-CM | POA: Diagnosis not present

## 2021-01-10 DIAGNOSIS — I130 Hypertensive heart and chronic kidney disease with heart failure and stage 1 through stage 4 chronic kidney disease, or unspecified chronic kidney disease: Secondary | ICD-10-CM | POA: Diagnosis present

## 2021-01-10 DIAGNOSIS — Z79899 Other long term (current) drug therapy: Secondary | ICD-10-CM

## 2021-01-10 DIAGNOSIS — E039 Hypothyroidism, unspecified: Secondary | ICD-10-CM | POA: Diagnosis present

## 2021-01-10 DIAGNOSIS — Z9221 Personal history of antineoplastic chemotherapy: Secondary | ICD-10-CM

## 2021-01-10 DIAGNOSIS — M2550 Pain in unspecified joint: Secondary | ICD-10-CM | POA: Diagnosis not present

## 2021-01-10 HISTORY — DX: Ischemic cardiomyopathy: I25.5

## 2021-01-10 HISTORY — DX: Nonrheumatic mitral (valve) insufficiency: I34.0

## 2021-01-10 HISTORY — DX: Permanent atrial fibrillation: I48.21

## 2021-01-10 HISTORY — DX: Pulmonary hypertension, unspecified: I27.20

## 2021-01-10 LAB — CBC WITH DIFFERENTIAL/PLATELET
Abs Immature Granulocytes: 0.03 10*3/uL (ref 0.00–0.07)
Basophils Absolute: 0 10*3/uL (ref 0.0–0.1)
Basophils Relative: 1 %
Eosinophils Absolute: 0.1 10*3/uL (ref 0.0–0.5)
Eosinophils Relative: 1 %
HCT: 26.8 % — ABNORMAL LOW (ref 39.0–52.0)
Hemoglobin: 8 g/dL — ABNORMAL LOW (ref 13.0–17.0)
Immature Granulocytes: 0 %
Lymphocytes Relative: 9 %
Lymphs Abs: 0.6 10*3/uL — ABNORMAL LOW (ref 0.7–4.0)
MCH: 27.1 pg (ref 26.0–34.0)
MCHC: 29.9 g/dL — ABNORMAL LOW (ref 30.0–36.0)
MCV: 90.8 fL (ref 80.0–100.0)
Monocytes Absolute: 0.5 10*3/uL (ref 0.1–1.0)
Monocytes Relative: 7 %
Neutro Abs: 5.7 10*3/uL (ref 1.7–7.7)
Neutrophils Relative %: 82 %
Platelets: 231 10*3/uL (ref 150–400)
RBC: 2.95 MIL/uL — ABNORMAL LOW (ref 4.22–5.81)
RDW: 16.5 % — ABNORMAL HIGH (ref 11.5–15.5)
WBC: 6.9 10*3/uL (ref 4.0–10.5)
nRBC: 0 % (ref 0.0–0.2)

## 2021-01-10 LAB — LIPASE, BLOOD: Lipase: 22 U/L (ref 11–51)

## 2021-01-10 LAB — COMPREHENSIVE METABOLIC PANEL
ALT: 13 U/L (ref 0–44)
AST: 18 U/L (ref 15–41)
Albumin: 2.9 g/dL — ABNORMAL LOW (ref 3.5–5.0)
Alkaline Phosphatase: 37 U/L — ABNORMAL LOW (ref 38–126)
Anion gap: 8 (ref 5–15)
BUN: 37 mg/dL — ABNORMAL HIGH (ref 8–23)
CO2: 24 mmol/L (ref 22–32)
Calcium: 8.3 mg/dL — ABNORMAL LOW (ref 8.9–10.3)
Chloride: 103 mmol/L (ref 98–111)
Creatinine, Ser: 1.79 mg/dL — ABNORMAL HIGH (ref 0.61–1.24)
GFR, Estimated: 36 mL/min — ABNORMAL LOW (ref >60)
Glucose, Bld: 102 mg/dL — ABNORMAL HIGH (ref 70–99)
Potassium: 4.1 mmol/L (ref 3.5–5.1)
Sodium: 135 mmol/L (ref 135–145)
Total Bilirubin: 0.8 mg/dL (ref 0.3–1.2)
Total Protein: 5.6 g/dL — ABNORMAL LOW (ref 6.5–8.1)

## 2021-01-10 LAB — TROPONIN I (HIGH SENSITIVITY): Troponin I (High Sensitivity): 20 ng/L — ABNORMAL HIGH (ref <18)

## 2021-01-10 LAB — I-STAT VENOUS BLOOD GAS, ED
Acid-Base Excess: 1 mmol/L (ref 0.0–2.0)
Bicarbonate: 24.7 mmol/L (ref 20.0–28.0)
Calcium, Ion: 1.1 mmol/L — ABNORMAL LOW (ref 1.15–1.40)
HCT: 26 % — ABNORMAL LOW (ref 39.0–52.0)
Hemoglobin: 8.8 g/dL — ABNORMAL LOW (ref 13.0–17.0)
O2 Saturation: 88 %
Potassium: 4.2 mmol/L (ref 3.5–5.1)
Sodium: 138 mmol/L (ref 135–145)
TCO2: 26 mmol/L (ref 22–32)
pCO2, Ven: 35.7 mmHg — ABNORMAL LOW (ref 44.0–60.0)
pH, Ven: 7.449 — ABNORMAL HIGH (ref 7.250–7.430)
pO2, Ven: 52 mmHg — ABNORMAL HIGH (ref 32.0–45.0)

## 2021-01-10 LAB — MAGNESIUM: Magnesium: 2 mg/dL (ref 1.7–2.4)

## 2021-01-10 LAB — AMMONIA: Ammonia: 15 umol/L (ref 9–35)

## 2021-01-10 LAB — BRAIN NATRIURETIC PEPTIDE: B Natriuretic Peptide: 305.5 pg/mL — ABNORMAL HIGH (ref 0.0–100.0)

## 2021-01-10 LAB — TSH: TSH: 9.056 u[IU]/mL — ABNORMAL HIGH (ref 0.350–4.500)

## 2021-01-10 LAB — LACTIC ACID, PLASMA: Lactic Acid, Venous: 0.9 mmol/L (ref 0.5–1.9)

## 2021-01-10 MED ORDER — LACTATED RINGERS IV BOLUS: 1000.0000 mL | Freq: Once | INTRAVENOUS | Status: DC

## 2021-01-10 MED ORDER — LACTATED RINGERS IV BOLUS
250.0000 mL | Freq: Once | INTRAVENOUS | Status: AC
  Administered 2021-01-10: 250 mL via INTRAVENOUS

## 2021-01-10 MED ORDER — IOHEXOL 350 MG/ML SOLN
60.0000 mL | Freq: Once | INTRAVENOUS | Status: AC | PRN
  Administered 2021-01-10: 60 mL via INTRAVENOUS

## 2021-01-10 NOTE — ED Notes (Signed)
Pt reports his baseline temp is usually around 97ish, Pt states he does not want any blankets at this time

## 2021-01-10 NOTE — ED Provider Notes (Signed)
[facility] EMERGENCY DEPARTMENT Provider Note   CSN: [id number] Arrival date & time: 01/10/21  1952     History Chief Complaint  Patient presents with  . Dehydration  . Hypotension    [name] [name] is a 87 y.o. male with h/o permanent Afib s/p PPM (no AC due to previous GIBs), CAD s/p CABG (2002), ICM, CHF, HTN, and HLD who presents to the ED from [facility] for hypotension. Per daughter, patient appears more irritable and confused today. Nurse checked BP this afternoon and reported SBP 70s. Daughter states patient developed nonbloody diarrhea yesterday and decreased PO intake today. Patient also states he has been feeling SOB recently. Denies headache, fever, chills, chest pain, cough, abdominal pain, nausea, vomiting, or urinary symptoms. Admitted last month for syncope and VT. Currently resides at [facility] and uses walker to ambulate.  The history is provided by the patient, medical records and a relative.  Altered Mental Status Presenting symptoms: behavior changes, confusion and lethargy   Severity:  Moderate Most recent episode:  Today Episode history:  Single Duration:  1 day Timing:  Constant Progression:  Unchanged Chronicity:  New Context: nursing home resident   Associated symptoms: decreased appetite, difficulty breathing and weakness   Associated symptoms: no abdominal pain, no fever, no headaches, no nausea, no palpitations, no rash, no seizures, no visual change and no vomiting       Past Medical History:  Diagnosis Date  . CAD (coronary artery disease)    Old inferolateral MI s/p CABG in 2002  . CAP (community acquired pneumonia) 10/03/2011  . Cardiomyopathy    s/p ICD 09/30/08; EF 35 to 40% per echo in October 2021  . Chronic systolic CHF (congestive heart failure) ([place]) 07/24/2013  . Colon cancer ([place])    s/p chemo, RT, sugery  . Colorectal anastomotic stricture   . Diverticulosis   . ED (erectile  dysfunction)   . GERD (gastroesophageal reflux disease)    rarely  . HTN (hypertension)   . ICD (implantable cardiac defibrillator) discharge October 2012   removed 2016    now only PPM  . Iron deficiency anemia   . Paroxysmal atrial fibrillation (HCC)    controlled with amiodarone, not a coumadin candidate due to GI bleeding  . V tach ([place]) 12/14/2020    Patient Active Problem List   Diagnosis Date Noted  . Acute on chronic HFrEF (heart failure with reduced ejection fraction) ([place]) 12/13/2020  . Pressure injury of skin 11/04/2020  . Macrocytic anemia 10/23/2020  . Hypoalbuminemia 10/23/2020  . COVID-19   . Sepsis ([place])   . Wide-complex tachycardia ([place])   . GI bleed 09/23/2018  . Ulceration of intestine   . Lower GI bleed 07/01/2018  . ICD in place- MDT 2009 07/03/2014  . History of GI bleed-  07/03/2014  . Essential hypertension 07/03/2014  . Acute on chronic systolic (congestive) heart failure ([place]) 10/15/2013  . Dizziness 10/14/2013  . Chronic systolic CHF (congestive heart failure) ([place]) 07/24/2013  . Ischemic cardiomyopathy 10/14/2011  . Automatic implantable cardioverter/defibrillator (AICD) activation 10/02/2011  . A-fib ([place]) 03/21/2011  . CAD (coronary artery disease) 03/21/2011    Past Surgical History:  Procedure Laterality Date  . APPENDECTOMY    . BACK SURGERY    . BIOPSY  09/22/2018   Procedure: BIOPSY;  Surgeon: Gatha Mayer, MD;  Location: [facility] ENDOSCOPY;  Service: Endoscopy;;  . CARDIAC CATHETERIZATION  11/07/2000   EF 32%  .  CARDIAC DEFIBRILLATOR PLACEMENT  09/30/08   MDT dual chamber ICD placed by Dr Rayann Heman  . CARDIOVASCULAR STRESS TEST  08/06/2008   EF 30%  . CHOLECYSTECTOMY    . COLON RESECTION     x2  . COLONOSCOPY     multiple  . COLONOSCOPY N/A 09/22/2018   Procedure: COLONOSCOPY;  Surgeon: Gatha Mayer, MD;  Location: [facility] ENDOSCOPY;  Service: Endoscopy;  Laterality: N/A;  . CORONARY ARTERY BYPASS GRAFT    . EP IMPLANTABLE DEVICE N/A  10/15/2015   Procedure:  PPM Generator Changeout;  Surgeon: [name], MD;  Location: [facility];  Service: Cardiovascular;  Laterality: N/A;  . HERNIA REPAIR    . US ECHOCARDIOGRAPHY  08/11/2008   EF 30-35%  . VASECTOMY         Family History  Problem Relation Age of Onset  . Colon cancer Father   . Diabetes Sister   . Colon polyps Brother   . Pancreatic cancer Brother 54    Social History   Tobacco Use  . Smoking status: Former Smoker    Types: Cigars  . Smokeless tobacco: Never Used  Vaping Use  . Vaping Use: Never used  Substance Use Topics  . Alcohol use: No  . Drug use: No    Home Medications Prior to Admission medications   Medication Sig Start Date End Date Taking? Authorizing Provider  amiodarone (PACERONE) 200 MG tablet Take 2 tablets (400 mg total) by mouth 2 (two) times daily. START with 400mg  PO BID for 10 days THEN reduce to 400mg  PO daily for 10 days THEN reduce to 200mg  PO daily 12/17/20   Baldwin Jamaica, PA-C  BESIVANCE 0.6 % SUSP Place 1 drop into the right eye 3 (three) times daily. 08/23/18   [provider]  cholecalciferol (VITAMIN D) 1000 UNITS tablet Take 1,000 Units by mouth daily.    [provider]  diphenoxylate-atropine (LOMOTIL) 2.5-0.025 MG tablet Take 1 tablet by mouth 4 (four) times daily as needed for diarrhea or loose stools.  12/27/19   [provider]  DUREZOL 0.05 % EMUL Place 1 drop into the right eye 3 (three) times daily. 08/23/18   [provider]  furosemide (LASIX) 40 MG tablet Take 1 tablet (40 mg total) by mouth daily. 12/10/20   [name], Peter M, MD  Magnesium Oxide 200 MG TABS Take 1 tablet (200 mg total) by mouth daily. 12/17/20   Baldwin Jamaica, PA-C  metoprolol succinate (TOPROL-XL) 100 MG 24 hr tablet TAKE 1 TABLET (100 MG TOTAL) BY MOUTH DAILY WITH OR IMMEDIATELY FOLLOWING A MEAL Patient taking differently: Take 100 mg by mouth daily. 01/08/20   Almyra Deforest, PA  Multiple  Vitamins-Minerals (MULTIVITAMINS THER. W/MINERALS) TABS Take 1 tablet by mouth daily.    [provider]  nitroGLYCERIN (NITROSTAT) 0.4 MG SL tablet Place 1 tablet (0.4 mg total) under the tongue every 5 (five) minutes as needed for chest pain. 05/07/14   [name], Peter M, MD  pantoprazole (PROTONIX) 40 MG tablet Take 1 tablet (40 mg total) by mouth 2 (two) times daily. 11/02/20   Thurnell Lose, MD  PROAIR HFA 108 (90 BASE) MCG/ACT inhaler Inhale 2 puffs into the lungs every 4 (four) hours as needed for shortness of breath.  10/22/13   [provider]  ramipril (ALTACE) 2.5 MG capsule Take 1 capsule (2.5 mg total) by mouth daily. 11/02/20   Thurnell Lose, MD  spironolactone (ALDACTONE) 25 MG tablet TAKE  1/2 TABLET EVERY DAY (NEED MD APPOINTMENT) Patient taking differently: Take 12.5 mg by mouth daily. 10/12/20   Allred, Jeneen Rinks, MD  tamsulosin (FLOMAX) 0.4 MG CAPS capsule Take 1 capsule (0.4 mg total) by mouth daily. 11/02/20   Thurnell Lose, MD  vitamin B-12 (CYANOCOBALAMIN) 1000 MCG tablet Take 1,000 mcg by mouth daily.    [provider]    Allergies    Iron, Lescol [fluvastatin sodium], Pentazocine lactate, Vytorin [ezetimibe-simvastatin], and Aspirin  Review of Systems   Review of Systems  Constitutional: Positive for appetite change, decreased appetite and fatigue. Negative for chills and fever.  HENT: Negative for ear pain and sore throat.   Eyes: Negative for pain and visual disturbance.  Respiratory: Positive for shortness of breath. Negative for cough.   Cardiovascular: Negative for chest pain and palpitations.  Gastrointestinal: Positive for diarrhea. Negative for abdominal pain, blood in stool, nausea and vomiting.  Genitourinary: Negative for dysuria and hematuria.  Musculoskeletal: Negative for arthralgias and back pain.  Skin: Negative for color change and rash.  Neurological: Positive for weakness. Negative for seizures, syncope, facial  asymmetry and headaches.  Psychiatric/Behavioral: Positive for confusion.  All other systems reviewed and are negative.   Physical Exam Updated Vital Signs BP (!) 78/58   Pulse 85   Temp (!) 97.4 F (36.3 C) (Rectal)   Resp 19   SpO2 97%   Physical Exam Vitals and nursing note reviewed. Exam conducted with a chaperone present.  Constitutional:      General: He is awake. He is not in acute distress.    Appearance: He is well-developed, well-groomed, underweight and well-nourished. He is not ill-appearing.  HENT:     Head: Normocephalic and atraumatic.     Right Ear: External ear normal.     Left Ear: External ear normal.     Nose: Nose normal. No congestion or rhinorrhea.     Mouth/Throat:     Mouth: Mucous membranes are moist.     Pharynx: Oropharynx is clear. No oropharyngeal exudate or posterior oropharyngeal erythema.  Eyes:     General: No scleral icterus.       Right eye: No discharge.        Left eye: No discharge.     Extraocular Movements: Extraocular movements intact.     Conjunctiva/sclera: Conjunctivae normal.     Pupils: Pupils are equal, round, and reactive to light.  Cardiovascular:     Rate and Rhythm: Normal rate and regular rhythm.     Heart sounds: No murmur heard.   Pulmonary:     Effort: Pulmonary effort is normal. No respiratory distress.     Breath sounds: Normal breath sounds.  Abdominal:     Palpations: Abdomen is soft.     Tenderness: There is no abdominal tenderness.  Genitourinary:    Rectum: Normal. No tenderness, external hemorrhoid or internal hemorrhoid.     Comments: No gross blood or stool on DRE. Musculoskeletal:        General: No edema.     Cervical back: Neck supple.  Skin:    General: Skin is warm and dry.  Neurological:     Mental Status: He is alert.  Psychiatric:        Mood and Affect: Mood and affect normal.        Behavior: Behavior is cooperative.     ED Results / Procedures / Treatments   Labs (all labs  ordered are listed, but only abnormal results are displayed) Labs  Reviewed  CBC WITH DIFFERENTIAL/PLATELET - Abnormal; Notable for the following components:      Result Value   RBC 2.95 (*)    Hemoglobin 8.0 (*)    HCT 26.8 (*)    MCHC 29.9 (*)    RDW 16.5 (*)    Lymphs Abs 0.6 (*)    All other components within normal limits  COMPREHENSIVE METABOLIC PANEL - Abnormal; Notable for the following components:   Glucose, Bld 102 (*)    BUN 37 (*)    Creatinine, Ser 1.79 (*)    Calcium 8.3 (*)    Total Protein 5.6 (*)    Albumin 2.9 (*)    Alkaline Phosphatase 37 (*)    GFR, Estimated 36 (*)    All other components within normal limits  BRAIN NATRIURETIC PEPTIDE - Abnormal; Notable for the following components:   B Natriuretic Peptide 305.5 (*)    All other components within normal limits  TSH - Abnormal; Notable for the following components:   TSH 9.056 (*)    All other components within normal limits  I-STAT VENOUS BLOOD GAS, ED - Abnormal; Notable for the following components:   pH, Ven 7.449 (*)    pCO2, Ven 35.7 (*)    pO2, Ven 52.0 (*)    Calcium, Ion 1.10 (*)    HCT 26.0 (*)    Hemoglobin 8.8 (*)    All other components within normal limits  TROPONIN I (HIGH SENSITIVITY) - Abnormal; Notable for the following components:   Troponin I (High Sensitivity) 20 (*)    All other components within normal limits  RESP PANEL BY RT-PCR (FLU A&B, COVID) ARPGX2  CULTURE, BLOOD (ROUTINE X 2)  CULTURE, BLOOD (ROUTINE X 2)  MAGNESIUM  AMMONIA  LACTIC ACID, PLASMA  LIPASE, BLOOD  BLOOD GAS, VENOUS  URINALYSIS, ROUTINE W REFLEX MICROSCOPIC  DIGOXIN LEVEL  POC OCCULT BLOOD, ED  TROPONIN I (HIGH SENSITIVITY)    EKG EKG Interpretation  Date/Time:  Sunday January 10 2021 19:57:58 EST Ventricular Rate:  79 PR Interval:    QRS Duration: 173 QT Interval:  495 QTC Calculation: 568 R Axis:   -93 Text Interpretation: Atrial fibrillation Nonspecific IVCD with LAD Consider left  ventricular hypertrophy Confirmed by Lennice Sites [phone]) on 01/10/2021 8:00:21 PM   Radiology CT Head Wo Contrast  Result Date: 01/10/2021 CLINICAL DATA:  Mental status changes of unknown cause. EXAM: CT HEAD WITHOUT CONTRAST TECHNIQUE: Contiguous axial images were obtained from the base of the skull through the vertex without intravenous contrast. COMPARISON:  10/14/2013 FINDINGS: Brain: Age related generalized atrophy. Chronic small-vessel ischemic changes of the cerebral hemispheric white matter. No sign of acute infarction, mass lesion, hemorrhage, hydrocephalus or extra-axial collection. Vascular: There is atherosclerotic calcification of the major vessels at the base of the brain. Skull: Negative Sinuses/Orbits: Clear/normal Other: None IMPRESSION: No acute finding by CT. Age related atrophy and chronic small-vessel ischemic changes of the white matter. Electronically Signed   By: Nelson Chimes M.D.   On: 01/10/2021 21:14   CT Angio Chest PE W and/or Wo Contrast  Result Date: 01/10/2021 CLINICAL DATA:  PE suspected, high prob Weakness and lethargy. EXAM: CT ANGIOGRAPHY CHEST WITH CONTRAST TECHNIQUE: Multidetector CT imaging of the chest was performed using the standard protocol during bolus administration of intravenous contrast. Multiplanar CT image reconstructions and MIPs were obtained to evaluate the vascular anatomy. CONTRAST:  77mL OMNIPAQUE IOHEXOL 350 MG/ML SOLN COMPARISON:  Radiograph earlier today.  Chest CT 10/14/2013 FINDINGS: Cardiovascular:  There are no filling defects within the pulmonary arteries to suggest pulmonary embolus. Left-sided pacemaker in place. Aortic atherosclerosis and tortuosity. Multi chamber cardiomegaly with contrast refluxing into the hepatic veins and IVC. No pericardial effusion. Post CABG with native coronary artery calcification. Contrast refluxes into chest wall collaterals in the azygos system. Mediastinum/Nodes: Shotty mediastinal lymph nodes, all  subcentimeter. No hilar adenopathy. Decompressed esophagus. No thyroid nodule. Lungs/Pleura: Small bilateral pleural effusions, right greater than left. Scattered reticular and ground-glass opacities throughout both lungs, likely representing scarring, many at site of prior nodular airspace disease. Small amount of fluid in the right major fissure, with questionable fissural nodularity. Trachea and central bronchi are patent. No pulmonary mass. Upper Abdomen: Assessed on concurrent abdominal CT, reported separately. Musculoskeletal: Scoliosis and ordinary degenerative change in the spine. There are no acute or suspicious osseous abnormalities. Review of the MIP images confirms the above findings. IMPRESSION: 1. No pulmonary embolus. 2. Multi chamber cardiomegaly with contrast refluxing into the hepatic veins and IVC consistent with elevated right heart pressures. Small bilateral pleural effusions, right greater than left. 3. Scattered reticular and ground-glass opacities throughout both lungs, likely representing scarring, many at site of prior nodular airspace disease. Pneumonia is not entirely excluded, recommend correlation for acute infective symptoms. Aortic Atherosclerosis (ICD10-I70.0). Electronically Signed   By:  Rake M.D.   On: 01/10/2021 23:45   DG Chest Portable 1 View  Result Date: 01/10/2021 CLINICAL DATA:  Altered mental status EXAM: PORTABLE CHEST 1 VIEW COMPARISON:  December 13, 2020 FINDINGS: There is a dual chamber pacemaker/ICD in place. The heart size is enlarged. There is a small left-sided pleural effusion. There are chronic airspace opacities bilaterally. No pneumothorax. Atelectasis versus scarring is noted at the left lung base. There is no definite acute osseous abnormality. IMPRESSION: 1. Cardiomegaly with small left pleural effusion. 2. Scattered chronic airspace opacities bilaterally favored to represent scarring or atelectasis. Electronically Signed   By: Constance Holster M.D.   On: 01/10/2021 20:47    Procedures Procedures   Medications Ordered in ED Medications  lactated ringers bolus 1,000 mL (has no administration in time range)  lactated ringers bolus 250 mL (has no administration in time range)  lactated ringers bolus 250 mL (250 mLs Intravenous New Bag/Given 01/10/21 2256)  iohexol (OMNIPAQUE) 350 MG/ML injection 60 mL (60 mLs Intravenous Contrast Given 01/10/21 2337)    ED Course  I have reviewed the triage vital signs and the nursing notes.  Pertinent labs & imaging results that were available during my care of the patient were reviewed by me and considered in my medical decision making (see chart for details).    MDM Rules/Calculators/A&P                          Patient is a 32yoM with a history and physical as described above who presents to the ED for hypotension. BP notable for 80s/50s, other vital signs reassuring. Patient in no acute distress on exam and mentating appropriately. Initial workup includes CBC, CMP, troponin, BNP, ammonia, lactic acid, UA, COVID, magnesium, blood cxs, CT head, ECG, and CXR. EMS gave 500cc bolus prior to arrival. Additional 500cc bolus given. No significant improvement of BP following 1st liter of fluid. Bedside POCUS US showed significantly reduced EF and plethoric IVC. Decision made to add additional 500cc bolus.  Labs notable for Cr 1.79, BUN 37, troponin 20, BNP 305, and Hgb 8.0. On reassessment, patient persistently hypotensive despite 1L  total IVF bolus. Decision made to scan chest and abdomen for potential etiology of hypotension. CT scans demonstrated mild enteritis but otherwise unremarkable. Discussed patient with Cardiology who did not consider presentation consistent with cardiogenic shock. Also discussed patient with Critical Care for further management recommendations and will evaluate patient in ED. Differential diagnosis includes adverse medication reaction, hypovolemic shock in setting of  recent GI loss, or cardiogenic shock in setting of significantly reduced EF. Plan at time of sign out includes f/u Critical Care. Anticipate ICU admission given persistent hypotension.   Transfer of care at 0000 to oncoming treatment team. Please refer to their note regarding further events and MDM.  Final Clinical Impression(s) / ED Diagnoses Final diagnoses:  Hypotension, unspecified hypotension type    Rx / DC Orders ED Discharge Orders    None       Christy Gentles, MD 01/11/21 0010    Lennice Sites, DO 01/11/21 9179

## 2021-01-10 NOTE — ED Notes (Signed)
Patient transported to CT 

## 2021-01-10 NOTE — ED Provider Notes (Incomplete)
[facility] DEPARTMENT Provider Note   CSN: [id number] Arrival date & time: 01/10/21  1952     History Chief Complaint  Patient presents with  . Dehydration  . Hypotension    [name] [name] is a 87 y.o. male with h/o permanent Afib s/p PPM (no AC due to previous GIBs), CAD s/p CABG (2002), ICM, CHF, HTN, and HLD who presents to the ED from [facility] for hypotension. Per daughter, patient appears more irritable and confused today. Nurse checked BP this afternoon and reported SBP 70s. Daughter states patient developed nonbloody diarrhea yesterday and decreased PO intake today. Patient also states he has been feeling SOB recently. Denies headache, fever, chills, chest pain, cough, abdominal pain, nausea, vomiting, or urinary symptoms. Admitted last month for syncope and VT. Currently resides at [facility] and uses walker to ambulate.  The history is provided by the patient, medical records and a relative.  Altered Mental Status Presenting symptoms: behavior changes, confusion and lethargy   Severity:  Moderate Most recent episode:  Today Episode history:  Single Duration:  1 day Timing:  Constant Progression:  Unchanged Chronicity:  New Context: nursing home resident   Associated symptoms: decreased appetite, difficulty breathing and weakness   Associated symptoms: no abdominal pain, no fever, no headaches, no nausea, no palpitations, no rash, no seizures, no visual change and no vomiting       Past Medical History:  Diagnosis Date  . CAD (coronary artery disease)    Old inferolateral MI s/p CABG in 2002  . CAP (community acquired pneumonia) 10/03/2011  . Cardiomyopathy    s/p ICD 09/30/08; EF 35 to 40% per echo in October 2021  . Chronic systolic CHF (congestive heart failure) ([place]) 07/24/2013  . Colon cancer ([place])    s/p chemo, RT, sugery  . Colorectal anastomotic stricture   . Diverticulosis   . ED (erectile  dysfunction)   . GERD (gastroesophageal reflux disease)    rarely  . HTN (hypertension)   . ICD (implantable cardiac defibrillator) discharge October 2012   removed 2016    now only PPM  . Iron deficiency anemia   . Paroxysmal atrial fibrillation (HCC)    controlled with amiodarone, not a coumadin candidate due to GI bleeding  . V tach ([place]) 12/14/2020    Patient Active Problem List   Diagnosis Date Noted  . Acute on chronic HFrEF (heart failure with reduced ejection fraction) ([place]) 12/13/2020  . Pressure injury of skin 11/04/2020  . Macrocytic anemia 10/23/2020  . Hypoalbuminemia 10/23/2020  . COVID-19   . Sepsis ([place])   . Wide-complex tachycardia ([place])   . GI bleed 09/23/2018  . Ulceration of intestine   . Lower GI bleed 07/01/2018  . ICD in place- MDT 2009 07/03/2014  . History of GI bleed-  07/03/2014  . Essential hypertension 07/03/2014  . Acute on chronic systolic (congestive) heart failure ([place]) 10/15/2013  . Dizziness 10/14/2013  . Chronic systolic CHF (congestive heart failure) ([place]) 07/24/2013  . Ischemic cardiomyopathy 10/14/2011  . Automatic implantable cardioverter/defibrillator (AICD) activation 10/02/2011  . A-fib ([place]) 03/21/2011  . CAD (coronary artery disease) 03/21/2011    Past Surgical History:  Procedure Laterality Date  . APPENDECTOMY    . BACK SURGERY    . BIOPSY  09/22/2018   Procedure: BIOPSY;  Surgeon: Gatha Mayer, MD;  Location: [facility];  Service: Endoscopy;;  . CARDIAC CATHETERIZATION  11/07/2000   EF 32%  .  CARDIAC DEFIBRILLATOR PLACEMENT  09/30/08   MDT dual chamber ICD placed by Dr Rayann Heman  . CARDIOVASCULAR STRESS TEST  08/06/2008   EF 30%  . CHOLECYSTECTOMY    . COLON RESECTION     x2  . COLONOSCOPY     multiple  . COLONOSCOPY N/A 09/22/2018   Procedure: COLONOSCOPY;  Surgeon: Gatha Mayer, MD;  Location: [facility] ENDOSCOPY;  Service: Endoscopy;  Laterality: N/A;  . CORONARY ARTERY BYPASS GRAFT    . EP IMPLANTABLE DEVICE N/A  10/15/2015   Procedure:  PPM Generator Changeout;  Surgeon: [name], MD;  Location: [facility];  Service: Cardiovascular;  Laterality: N/A;  . HERNIA REPAIR    . US ECHOCARDIOGRAPHY  08/11/2008   EF 30-35%  . VASECTOMY         Family History  Problem Relation Age of Onset  . Colon cancer Father   . Diabetes Sister   . Colon polyps Brother   . Pancreatic cancer Brother 81    Social History   Tobacco Use  . Smoking status: Former Smoker    Types: Cigars  . Smokeless tobacco: Never Used  Vaping Use  . Vaping Use: Never used  Substance Use Topics  . Alcohol use: No  . Drug use: No    Home Medications Prior to Admission medications   Medication Sig Start Date End Date Taking? Authorizing Provider  amiodarone (PACERONE) 200 MG tablet Take 2 tablets (400 mg total) by mouth 2 (two) times daily. START with 400mg  PO BID for 10 days THEN reduce to 400mg  PO daily for 10 days THEN reduce to 200mg  PO daily 12/17/20   Baldwin Jamaica, PA-C  BESIVANCE 0.6 % SUSP Place 1 drop into the right eye 3 (three) times daily. 08/23/18   [provider]  cholecalciferol (VITAMIN D) 1000 UNITS tablet Take 1,000 Units by mouth daily.    [provider]  diphenoxylate-atropine (LOMOTIL) 2.5-0.025 MG tablet Take 1 tablet by mouth 4 (four) times daily as needed for diarrhea or loose stools.  12/27/19   [provider]  DUREZOL 0.05 % EMUL Place 1 drop into the right eye 3 (three) times daily. 08/23/18   [provider]  furosemide (LASIX) 40 MG tablet Take 1 tablet (40 mg total) by mouth daily. 12/10/20   [name], Peter M, MD  Magnesium Oxide 200 MG TABS Take 1 tablet (200 mg total) by mouth daily. 12/17/20   Baldwin Jamaica, PA-C  metoprolol succinate (TOPROL-XL) 100 MG 24 hr tablet TAKE 1 TABLET (100 MG TOTAL) BY MOUTH DAILY WITH OR IMMEDIATELY FOLLOWING A MEAL Patient taking differently: Take 100 mg by mouth daily. 01/08/20   Almyra Deforest, PA  Multiple  Vitamins-Minerals (MULTIVITAMINS THER. W/MINERALS) TABS Take 1 tablet by mouth daily.    [provider]  nitroGLYCERIN (NITROSTAT) 0.4 MG SL tablet Place 1 tablet (0.4 mg total) under the tongue every 5 (five) minutes as needed for chest pain. 05/07/14   [name], Peter M, MD  pantoprazole (PROTONIX) 40 MG tablet Take 1 tablet (40 mg total) by mouth 2 (two) times daily. 11/02/20   Thurnell Lose, MD  PROAIR HFA 108 (90 BASE) MCG/ACT inhaler Inhale 2 puffs into the lungs every 4 (four) hours as needed for shortness of breath.  10/22/13   [provider]  ramipril (ALTACE) 2.5 MG capsule Take 1 capsule (2.5 mg total) by mouth daily. 11/02/20   Thurnell Lose, MD  spironolactone (ALDACTONE) 25 MG tablet TAKE  1/2 TABLET EVERY DAY (NEED MD APPOINTMENT) Patient taking differently: Take 12.5 mg by mouth daily. 10/12/20   Allred, Jeneen Rinks, MD  tamsulosin (FLOMAX) 0.4 MG CAPS capsule Take 1 capsule (0.4 mg total) by mouth daily. 11/02/20   Thurnell Lose, MD  vitamin B-12 (CYANOCOBALAMIN) 1000 MCG tablet Take 1,000 mcg by mouth daily.    [provider]    Allergies    Iron, Lescol [fluvastatin sodium], Pentazocine lactate, Vytorin [ezetimibe-simvastatin], and Aspirin  Review of Systems   Review of Systems  Constitutional: Positive for appetite change, decreased appetite and fatigue. Negative for chills and fever.  HENT: Negative for ear pain and sore throat.   Eyes: Negative for pain and visual disturbance.  Respiratory: Positive for shortness of breath. Negative for cough.   Cardiovascular: Negative for chest pain and palpitations.  Gastrointestinal: Positive for diarrhea. Negative for abdominal pain, blood in stool, nausea and vomiting.  Genitourinary: Negative for dysuria and hematuria.  Musculoskeletal: Negative for arthralgias and back pain.  Skin: Negative for color change and rash.  Neurological: Positive for weakness. Negative for seizures, syncope, facial  asymmetry and headaches.  Psychiatric/Behavioral: Positive for confusion.  All other systems reviewed and are negative.   Physical Exam Updated Vital Signs BP (!) 78/58   Pulse 85   Temp (!) 97.4 F (36.3 C) (Rectal)   Resp 19   SpO2 97%   Physical Exam Vitals and nursing note reviewed. Exam conducted with a chaperone present.  Constitutional:      General: He is awake. He is not in acute distress.    Appearance: He is well-developed, well-groomed, underweight and well-nourished. He is not ill-appearing.  HENT:     Head: Normocephalic and atraumatic.     Right Ear: External ear normal.     Left Ear: External ear normal.     Nose: Nose normal. No congestion or rhinorrhea.     Mouth/Throat:     Mouth: Mucous membranes are moist.     Pharynx: Oropharynx is clear. No oropharyngeal exudate or posterior oropharyngeal erythema.  Eyes:     General: No scleral icterus.       Right eye: No discharge.        Left eye: No discharge.     Extraocular Movements: Extraocular movements intact.     Conjunctiva/sclera: Conjunctivae normal.     Pupils: Pupils are equal, round, and reactive to light.  Cardiovascular:     Rate and Rhythm: Normal rate and regular rhythm.     Heart sounds: No murmur heard.   Pulmonary:     Effort: Pulmonary effort is normal. No respiratory distress.     Breath sounds: Normal breath sounds.  Abdominal:     Palpations: Abdomen is soft.     Tenderness: There is no abdominal tenderness.  Genitourinary:    Rectum: Normal. No tenderness, external hemorrhoid or internal hemorrhoid.     Comments: No gross blood or stool on DRE. Musculoskeletal:        General: No edema.     Cervical back: Neck supple.  Skin:    General: Skin is warm and dry.  Neurological:     Mental Status: He is alert.  Psychiatric:        Mood and Affect: Mood and affect normal.        Behavior: Behavior is cooperative.     ED Results / Procedures / Treatments   Labs (all labs  ordered are listed, but only abnormal results are displayed) Labs  Reviewed  CBC WITH DIFFERENTIAL/PLATELET - Abnormal; Notable for the following components:      Result Value   RBC 2.95 (*)    Hemoglobin 8.0 (*)    HCT 26.8 (*)    MCHC 29.9 (*)    RDW 16.5 (*)    Lymphs Abs 0.6 (*)    All other components within normal limits  COMPREHENSIVE METABOLIC PANEL - Abnormal; Notable for the following components:   Glucose, Bld 102 (*)    BUN 37 (*)    Creatinine, Ser 1.79 (*)    Calcium 8.3 (*)    Total Protein 5.6 (*)    Albumin 2.9 (*)    Alkaline Phosphatase 37 (*)    GFR, Estimated 36 (*)    All other components within normal limits  BRAIN NATRIURETIC PEPTIDE - Abnormal; Notable for the following components:   B Natriuretic Peptide 305.5 (*)    All other components within normal limits  TSH - Abnormal; Notable for the following components:   TSH 9.056 (*)    All other components within normal limits  I-STAT VENOUS BLOOD GAS, ED - Abnormal; Notable for the following components:   pH, Ven 7.449 (*)    pCO2, Ven 35.7 (*)    pO2, Ven 52.0 (*)    Calcium, Ion 1.10 (*)    HCT 26.0 (*)    Hemoglobin 8.8 (*)    All other components within normal limits  TROPONIN I (HIGH SENSITIVITY) - Abnormal; Notable for the following components:   Troponin I (High Sensitivity) 20 (*)    All other components within normal limits  RESP PANEL BY RT-PCR (FLU A&B, COVID) ARPGX2  CULTURE, BLOOD (ROUTINE X 2)  CULTURE, BLOOD (ROUTINE X 2)  MAGNESIUM  AMMONIA  LACTIC ACID, PLASMA  LIPASE, BLOOD  BLOOD GAS, VENOUS  URINALYSIS, ROUTINE W REFLEX MICROSCOPIC  DIGOXIN LEVEL  POC OCCULT BLOOD, ED  TROPONIN I (HIGH SENSITIVITY)    EKG EKG Interpretation  Date/Time:  Sunday January 10 2021 19:57:58 EST Ventricular Rate:  79 PR Interval:    QRS Duration: 173 QT Interval:  495 QTC Calculation: 568 R Axis:   -93 Text Interpretation: Atrial fibrillation Nonspecific IVCD with LAD Consider left  ventricular hypertrophy Confirmed by Lennice Sites [phone]) on 01/10/2021 8:00:21 PM   Radiology CT Head Wo Contrast  Result Date: 01/10/2021 CLINICAL DATA:  Mental status changes of unknown cause. EXAM: CT HEAD WITHOUT CONTRAST TECHNIQUE: Contiguous axial images were obtained from the base of the skull through the vertex without intravenous contrast. COMPARISON:  10/14/2013 FINDINGS: Brain: Age related generalized atrophy. Chronic small-vessel ischemic changes of the cerebral hemispheric white matter. No sign of acute infarction, mass lesion, hemorrhage, hydrocephalus or extra-axial collection. Vascular: There is atherosclerotic calcification of the major vessels at the base of the brain. Skull: Negative Sinuses/Orbits: Clear/normal Other: None IMPRESSION: No acute finding by CT. Age related atrophy and chronic small-vessel ischemic changes of the white matter. Electronically Signed   By: Nelson Chimes M.D.   On: 01/10/2021 21:14   CT Angio Chest PE W and/or Wo Contrast  Result Date: 01/10/2021 CLINICAL DATA:  PE suspected, high prob Weakness and lethargy. EXAM: CT ANGIOGRAPHY CHEST WITH CONTRAST TECHNIQUE: Multidetector CT imaging of the chest was performed using the standard protocol during bolus administration of intravenous contrast. Multiplanar CT image reconstructions and MIPs were obtained to evaluate the vascular anatomy. CONTRAST:  46mL OMNIPAQUE IOHEXOL 350 MG/ML SOLN COMPARISON:  Radiograph earlier today.  Chest CT 10/14/2013 FINDINGS: Cardiovascular:  There are no filling defects within the pulmonary arteries to suggest pulmonary embolus. Left-sided pacemaker in place. Aortic atherosclerosis and tortuosity. Multi chamber cardiomegaly with contrast refluxing into the hepatic veins and IVC. No pericardial effusion. Post CABG with native coronary artery calcification. Contrast refluxes into chest wall collaterals in the azygos system. Mediastinum/Nodes: Shotty mediastinal lymph nodes, all  subcentimeter. No hilar adenopathy. Decompressed esophagus. No thyroid nodule. Lungs/Pleura: Small bilateral pleural effusions, right greater than left. Scattered reticular and ground-glass opacities throughout both lungs, likely representing scarring, many at site of prior nodular airspace disease. Small amount of fluid in the right major fissure, with questionable fissural nodularity. Trachea and central bronchi are patent. No pulmonary mass. Upper Abdomen: Assessed on concurrent abdominal CT, reported separately. Musculoskeletal: Scoliosis and ordinary degenerative change in the spine. There are no acute or suspicious osseous abnormalities. Review of the MIP images confirms the above findings. IMPRESSION: 1. No pulmonary embolus. 2. Multi chamber cardiomegaly with contrast refluxing into the hepatic veins and IVC consistent with elevated right heart pressures. Small bilateral pleural effusions, right greater than left. 3. Scattered reticular and ground-glass opacities throughout both lungs, likely representing scarring, many at site of prior nodular airspace disease. Pneumonia is not entirely excluded, recommend correlation for acute infective symptoms. Aortic Atherosclerosis (ICD10-I70.0). Electronically Signed   By:  Rake M.D.   On: 01/10/2021 23:45   DG Chest Portable 1 View  Result Date: 01/10/2021 CLINICAL DATA:  Altered mental status EXAM: PORTABLE CHEST 1 VIEW COMPARISON:  December 13, 2020 FINDINGS: There is a dual chamber pacemaker/ICD in place. The heart size is enlarged. There is a small left-sided pleural effusion. There are chronic airspace opacities bilaterally. No pneumothorax. Atelectasis versus scarring is noted at the left lung base. There is no definite acute osseous abnormality. IMPRESSION: 1. Cardiomegaly with small left pleural effusion. 2. Scattered chronic airspace opacities bilaterally favored to represent scarring or atelectasis. Electronically Signed   By: Constance Holster M.D.   On: 01/10/2021 20:47    Procedures Procedures   Medications Ordered in ED Medications  lactated ringers bolus 1,000 mL (has no administration in time range)  lactated ringers bolus 250 mL (has no administration in time range)  lactated ringers bolus 250 mL (250 mLs Intravenous New Bag/Given 01/10/21 2256)  iohexol (OMNIPAQUE) 350 MG/ML injection 60 mL (60 mLs Intravenous Contrast Given 01/10/21 2337)    ED Course  I have reviewed the triage vital signs and the nursing notes.  Pertinent labs & imaging results that were available during my care of the patient were reviewed by me and considered in my medical decision making (see chart for details).    MDM Rules/Calculators/A&P                          Patient is a 75yoM with a history and physical as described above who presents to the ED for hypotension. BP notable for 80s/50s, other vital signs reassuring. Patient in no acute distress on exam and mentating appropriately. Initial workup includes CBC, CMP, troponin, BNP, ammonia, lactic acid, UA, COVID, magnesium, blood cxs, CT head, ECG, and CXR. EMS gave 500cc bolus prior to arrival. Additional 500cc bolus given. No significant improvement of BP following 1st liter of fluid. Bedside POCUS US showed significantly reduced EF and plethoric IVC. Decision made to add additional 500cc bolus.  Labs noted   Final Clinical Impression(s) / ED Diagnoses Final diagnoses:  Hypotension, unspecified hypotension type    Rx /  DC Orders ED Discharge Orders    None

## 2021-01-10 NOTE — ED Triage Notes (Signed)
Assume care from EMS, ems reports pt daughter stated pt has become lethargic and weak and dehydrated. EMS states upon arrival pt BP was 76/40 and was given 500cc of NS and  BP increase to 84/50. Ems states pt has an hx of afib and has a pacemaker   CBG 170 98% RA

## 2021-01-11 ENCOUNTER — Encounter (HOSPITAL_COMMUNITY): Payer: Self-pay | Admitting: Internal Medicine

## 2021-01-11 ENCOUNTER — Inpatient Hospital Stay (HOSPITAL_COMMUNITY): Payer: Medicare HMO

## 2021-01-11 DIAGNOSIS — J1282 Pneumonia due to coronavirus disease 2019: Secondary | ICD-10-CM | POA: Diagnosis present

## 2021-01-11 DIAGNOSIS — Z951 Presence of aortocoronary bypass graft: Secondary | ICD-10-CM | POA: Diagnosis not present

## 2021-01-11 DIAGNOSIS — Z66 Do not resuscitate: Secondary | ICD-10-CM | POA: Diagnosis present

## 2021-01-11 DIAGNOSIS — Z515 Encounter for palliative care: Secondary | ICD-10-CM | POA: Diagnosis not present

## 2021-01-11 DIAGNOSIS — R5381 Other malaise: Secondary | ICD-10-CM | POA: Diagnosis not present

## 2021-01-11 DIAGNOSIS — I2510 Atherosclerotic heart disease of native coronary artery without angina pectoris: Secondary | ICD-10-CM | POA: Diagnosis present

## 2021-01-11 DIAGNOSIS — E039 Hypothyroidism, unspecified: Secondary | ICD-10-CM | POA: Diagnosis present

## 2021-01-11 DIAGNOSIS — I9589 Other hypotension: Secondary | ICD-10-CM | POA: Diagnosis present

## 2021-01-11 DIAGNOSIS — E860 Dehydration: Secondary | ICD-10-CM | POA: Diagnosis present

## 2021-01-11 DIAGNOSIS — N184 Chronic kidney disease, stage 4 (severe): Secondary | ICD-10-CM | POA: Diagnosis present

## 2021-01-11 DIAGNOSIS — I959 Hypotension, unspecified: Secondary | ICD-10-CM | POA: Diagnosis present

## 2021-01-11 DIAGNOSIS — I5023 Acute on chronic systolic (congestive) heart failure: Secondary | ICD-10-CM | POA: Diagnosis present

## 2021-01-11 DIAGNOSIS — I130 Hypertensive heart and chronic kidney disease with heart failure and stage 1 through stage 4 chronic kidney disease, or unspecified chronic kidney disease: Secondary | ICD-10-CM | POA: Diagnosis present

## 2021-01-11 DIAGNOSIS — U071 COVID-19: Secondary | ICD-10-CM | POA: Diagnosis present

## 2021-01-11 DIAGNOSIS — Z85038 Personal history of other malignant neoplasm of large intestine: Secondary | ICD-10-CM | POA: Diagnosis not present

## 2021-01-11 DIAGNOSIS — M419 Scoliosis, unspecified: Secondary | ICD-10-CM | POA: Diagnosis not present

## 2021-01-11 DIAGNOSIS — I4821 Permanent atrial fibrillation: Secondary | ICD-10-CM | POA: Diagnosis present

## 2021-01-11 DIAGNOSIS — N179 Acute kidney failure, unspecified: Secondary | ICD-10-CM | POA: Diagnosis present

## 2021-01-11 DIAGNOSIS — R54 Age-related physical debility: Secondary | ICD-10-CM | POA: Diagnosis present

## 2021-01-11 DIAGNOSIS — Z7401 Bed confinement status: Secondary | ICD-10-CM | POA: Diagnosis not present

## 2021-01-11 DIAGNOSIS — E861 Hypovolemia: Secondary | ICD-10-CM

## 2021-01-11 DIAGNOSIS — Z7189 Other specified counseling: Secondary | ICD-10-CM | POA: Diagnosis not present

## 2021-01-11 DIAGNOSIS — R338 Other retention of urine: Secondary | ICD-10-CM | POA: Diagnosis present

## 2021-01-11 DIAGNOSIS — E785 Hyperlipidemia, unspecified: Secondary | ICD-10-CM | POA: Diagnosis present

## 2021-01-11 DIAGNOSIS — I255 Ischemic cardiomyopathy: Secondary | ICD-10-CM | POA: Diagnosis present

## 2021-01-11 DIAGNOSIS — A0839 Other viral enteritis: Secondary | ICD-10-CM | POA: Diagnosis present

## 2021-01-11 DIAGNOSIS — N401 Enlarged prostate with lower urinary tract symptoms: Secondary | ICD-10-CM | POA: Diagnosis present

## 2021-01-11 DIAGNOSIS — M2550 Pain in unspecified joint: Secondary | ICD-10-CM | POA: Diagnosis not present

## 2021-01-11 DIAGNOSIS — I472 Ventricular tachycardia: Secondary | ICD-10-CM | POA: Diagnosis present

## 2021-01-11 DIAGNOSIS — G929 Unspecified toxic encephalopathy: Secondary | ICD-10-CM | POA: Diagnosis present

## 2021-01-11 DIAGNOSIS — R112 Nausea with vomiting, unspecified: Secondary | ICD-10-CM | POA: Diagnosis not present

## 2021-01-11 DIAGNOSIS — Z950 Presence of cardiac pacemaker: Secondary | ICD-10-CM | POA: Diagnosis not present

## 2021-01-11 LAB — TROPONIN I (HIGH SENSITIVITY): Troponin I (High Sensitivity): 17 ng/L (ref <18)

## 2021-01-11 LAB — RESP PANEL BY RT-PCR (FLU A&B, COVID) ARPGX2
Influenza A by PCR: NEGATIVE
Influenza B by PCR: NEGATIVE
SARS Coronavirus 2 by RT PCR: POSITIVE — AB

## 2021-01-11 LAB — BASIC METABOLIC PANEL
Anion gap: 13 (ref 5–15)
BUN: 35 mg/dL — ABNORMAL HIGH (ref 8–23)
CO2: 19 mmol/L — ABNORMAL LOW (ref 22–32)
Calcium: 8.6 mg/dL — ABNORMAL LOW (ref 8.9–10.3)
Chloride: 103 mmol/L (ref 98–111)
Creatinine, Ser: 1.61 mg/dL — ABNORMAL HIGH (ref 0.61–1.24)
GFR, Estimated: 41 mL/min — ABNORMAL LOW (ref >60)
Glucose, Bld: 145 mg/dL — ABNORMAL HIGH (ref 70–99)
Potassium: 4.2 mmol/L (ref 3.5–5.1)
Sodium: 135 mmol/L (ref 135–145)

## 2021-01-11 LAB — MAGNESIUM: Magnesium: 2 mg/dL (ref 1.7–2.4)

## 2021-01-11 LAB — D-DIMER, QUANTITATIVE: D-Dimer, Quant: 1.77 ug/mL-FEU — ABNORMAL HIGH (ref 0.00–0.50)

## 2021-01-11 LAB — HEPARIN LEVEL (UNFRACTIONATED)
Heparin Unfractionated: 0.33 IU/mL (ref 0.30–0.70)
Heparin Unfractionated: 0.52 IU/mL (ref 0.30–0.70)

## 2021-01-11 LAB — DIGOXIN LEVEL: Digoxin Level: <0.2 ng/mL — ABNORMAL LOW (ref 0.8–2.0)

## 2021-01-11 LAB — BRAIN NATRIURETIC PEPTIDE: B Natriuretic Peptide: 504 pg/mL — ABNORMAL HIGH (ref 0.0–100.0)

## 2021-01-11 LAB — C-REACTIVE PROTEIN: CRP: 0.6 mg/dL (ref <1.0)

## 2021-01-11 LAB — T4, FREE: Free T4: 1.18 ng/dL — ABNORMAL HIGH (ref 0.61–1.12)

## 2021-01-11 LAB — MRSA PCR SCREENING: MRSA by PCR: NEGATIVE

## 2021-01-11 LAB — PROCALCITONIN: Procalcitonin: <0.1 ng/mL

## 2021-01-11 MED ORDER — PANTOPRAZOLE SODIUM 40 MG IV SOLR
40.0000 mg | Freq: Every day | INTRAVENOUS | Status: DC
  Administered 2021-01-11 (×2): 40 mg via INTRAVENOUS
  Filled 2021-01-11 (×2): qty 40

## 2021-01-11 MED ORDER — DEXAMETHASONE SODIUM PHOSPHATE 10 MG/ML IJ SOLN
6.0000 mg | INTRAMUSCULAR | Status: DC
  Administered 2021-01-11 – 2021-01-12 (×2): 6 mg via INTRAVENOUS
  Filled 2021-01-11: qty 1

## 2021-01-11 MED ORDER — HALOPERIDOL LACTATE 5 MG/ML IJ SOLN
2.0000 mg | Freq: Four times a day (QID) | INTRAMUSCULAR | Status: DC | PRN
  Administered 2021-01-11: 2 mg via INTRAVENOUS
  Filled 2021-01-11: qty 1

## 2021-01-11 MED ORDER — SODIUM CHLORIDE 0.9 % IV SOLN
200.0000 mg | Freq: Once | INTRAVENOUS | Status: AC
  Administered 2021-01-11: 200 mg via INTRAVENOUS
  Filled 2021-01-11: qty 200

## 2021-01-11 MED ORDER — ZINC SULFATE 220 (50 ZN) MG PO CAPS
220.0000 mg | ORAL_CAPSULE | Freq: Every day | ORAL | Status: DC
  Administered 2021-01-11 – 2021-01-15 (×5): 220 mg via ORAL
  Filled 2021-01-11 (×5): qty 1

## 2021-01-11 MED ORDER — DIFLUPREDNATE 0.05 % OP EMUL: 1.0000 [drp] | Freq: Three times a day (TID) | OPHTHALMIC | Status: DC

## 2021-01-11 MED ORDER — ONDANSETRON HCL 4 MG/2ML IJ SOLN
4.0000 mg | Freq: Four times a day (QID) | INTRAMUSCULAR | Status: DC | PRN
  Administered 2021-01-11: 4 mg via INTRAVENOUS
  Filled 2021-01-11: qty 2

## 2021-01-11 MED ORDER — SODIUM CHLORIDE 0.9 % IV BOLUS
500.0000 mL | Freq: Once | INTRAVENOUS | Status: AC
  Administered 2021-01-11: 500 mL via INTRAVENOUS

## 2021-01-11 MED ORDER — ASCORBIC ACID 500 MG PO TABS
1000.0000 mg | ORAL_TABLET | Freq: Three times a day (TID) | ORAL | Status: DC
  Administered 2021-01-11 – 2021-01-15 (×13): 1000 mg via ORAL
  Filled 2021-01-11 (×13): qty 2

## 2021-01-11 MED ORDER — HEPARIN (PORCINE) 25000 UT/250ML-% IV SOLN
800.0000 [IU]/h | INTRAVENOUS | Status: DC
  Administered 2021-01-11 – 2021-01-12 (×2): 800 [IU]/h via INTRAVENOUS
  Filled 2021-01-11 (×3): qty 250

## 2021-01-11 MED ORDER — GATIFLOXACIN 0.5 % OP SOLN
1.0000 [drp] | Freq: Four times a day (QID) | OPHTHALMIC | Status: DC
  Administered 2021-01-11 – 2021-01-15 (×15): 1 [drp] via OPHTHALMIC
  Filled 2021-01-11: qty 2.5

## 2021-01-11 MED ORDER — HEPARIN BOLUS VIA INFUSION
2000.0000 [IU] | Freq: Once | INTRAVENOUS | Status: AC
  Administered 2021-01-11: 2000 [IU] via INTRAVENOUS
  Filled 2021-01-11: qty 2000

## 2021-01-11 MED ORDER — NITROGLYCERIN 0.4 MG SL SUBL: 0.4000 mg | SUBLINGUAL_TABLET | SUBLINGUAL | Status: DC | PRN

## 2021-01-11 MED ORDER — AMIODARONE HCL 200 MG PO TABS
200.0000 mg | ORAL_TABLET | Freq: Every day | ORAL | Status: DC
  Administered 2021-01-11 – 2021-01-15 (×5): 200 mg via ORAL
  Filled 2021-01-11 (×5): qty 1

## 2021-01-11 MED ORDER — SODIUM CHLORIDE 0.9 % IV SOLN
100.0000 mg | Freq: Every day | INTRAVENOUS | Status: AC
  Administered 2021-01-12 – 2021-01-15 (×4): 100 mg via INTRAVENOUS
  Filled 2021-01-11 (×6): qty 20

## 2021-01-11 MED ORDER — LACTATED RINGERS IV SOLN: INTRAVENOUS | Status: DC

## 2021-01-11 NOTE — Progress Notes (Addendum)
PROGRESS NOTE                                                                                                                                                                                                             Patient Demographics:    Chia Rock, is a 87 y.o. male, DOB - 24-Oct-1932, QZR:[id number]  Outpatient Primary MD for the patient is Crist Infante, MD    LOS - 0  Admit date - 01/10/2021    Chief Complaint  Patient presents with  . Dehydration  . Hypotension       Brief Narrative (HPI from H&P)  - RICHARDO POPOFF is a 87 y.o. male with history of proximal atrial fibrillation status post pacemaker placement, cardiomyopathy status post ICD placement last EF was around 35 to 40%, chronic kidney disease stage III, chronic anemia history of colon cancer admitted November for Covid infection and admitted in January last month for VT at that time was placed on amiodarone presently in rehab was found to be lethargic and hypotensive with diarrhea.  Was admitted for COVID-19 infection with gastroenteritis and dehydration.   Subjective:    Georgina Snell today has, No headache, No chest pain, No abdominal pain - No Nausea, No new weakness tingling or numbness, no shortness of breath.   Assessment  & Plan :     1. Acute COVID-19 pneumonia in a patient who has taken 2 shots of vaccine so far -thankfully most of his symptoms are of toxic encephalopathy, no hypoxia.  Hydrate and monitor.  Encouraged the patient to sit up in chair in the daytime use I-S and flutter valve for pulmonary toiletry and then prone in bed when at night.  Will advance activity and titrate down oxygen as possible.    SpO2: 96 %  Recent Labs  Lab 01/10/21 2152 01/10/21 2200 01/10/21 2201 01/11/21 0921  WBC 6.9  --   --   --   HGB 8.0*  --  8.8*  --   HCT 26.8*  --  26.0*  --   PLT 231  --   --   --   CRP  --   --   --  0.6  BNP 305.5*  --    --  504.0*  AST 18  --   --   --   ALT  13  --   --   --   ALKPHOS 37*  --   --   --   BILITOT 0.8  --   --   --   ALBUMIN 2.9*  --   --   --   LATICACIDVEN 0.9  --   --   --   SARSCOV2NAA  --  POSITIVE*  --   --     2.  Dehydration with hypotension.  IV fluids and monitor.  3.  Weakness and deconditioning.  PT OT, back to SNF once mentation is slightly better and blood pressure improves.  4.  BPH.  On Flomax.  5.  History of paroxysmal A. fib.  For now amiodarone, holding beta-blocker and ACE inhibitor due to hypotension and dehydration.  [name] of greater than 3 but on no anticoagulation due to high fall risk.  6.  COVID-19 induced gastroenteritis.  Supportive care.  No GI complaints.  Nonspecific CT findings, outpatient age-appropriate follow-up by PCP.  Per son no heroics no surgery desired.  7.  Chronic systolic heart failure last EF around 30%.  Dehydrated.  Resume beta-blocker, ACE once blood pressure better.  8.  AKI on CKD 4.  Baseline creatinine 1.7.  Hydrate and monitor.  Hold ACE.       Condition - Fair  Family Communication  : Louellen Molder [phone] on 01/11/2021 in detail.  Code Status :  DNR  Consults  :  None   Procedures  :     CTA chest.  No PE bilateral groundglass opacities.    CT head.  Nonacute   CT - 1. Mild hyperemia of a short segment of small bowel in the central abdomen, suspicious for enteritis. No obstruction. 2. Small amount of ascites in the right and left upper quadrant. 3. Absent renal excretion on delayed phase imaging suggest underlying renal dysfunction. 4. Chronic biliary dilatation, unchanged from prior exam, likely related to prior cholecystectomy. Recommend correlation with LFTs. 5. Partially exophytic enhancing lesion from the posterior left kidney measures 14 x 8 mm, previously 13 x 11 mm. This remains concerning for incidental renal neoplasm, however the lack of significant change in size over the course of 2 years suggests  this is indolent. Should further workup be pursued, recommend MRI for characterization if patient is able to tolerate breath hold technique, giving consideration for density age. Aortic Atherosclerosis       PUD Prophylaxis : PPI  Disposition Plan  :    Status is: Inpatient  Remains inpatient appropriate because:IV treatments appropriate due to intensity of illness or inability to take PO   Dispo: The patient is from: SNF              Anticipated d/c is to: SNF              Anticipated d/c date is: 3 days              Patient currently is not medically stable to d/c.   Difficult to place patient No   DVT Prophylaxis  :   Heparin   Lab Results  Component Value Date   PLT 231 01/10/2021    Diet :  Diet Order            Diet Heart Room service appropriate? Yes; Fluid consistency: Thin; Fluid restriction: 1200 mL Fluid  Diet effective now                  Inpatient  Medications  Scheduled Meds: . vitamin C  1,000 mg Oral TID  . dexamethasone (DECADRON) injection  6 mg Intravenous Q24H  . pantoprazole (PROTONIX) IV  40 mg Intravenous QHS  . zinc sulfate  220 mg Oral Daily   Continuous Infusions: . heparin 800 Units/hr (01/11/21 0844)  . lactated ringers 125 mL/hr at 01/11/21 0753  . [START ON 01/12/2021] remdesivir 100 mg in NS 100 mL     PRN Meds:.ondansetron (ZOFRAN) IV  Antibiotics  :    Anti-infectives (From admission, onward)   Start     Dose/Rate Route Frequency Ordered Stop   01/12/21 1000  remdesivir 100 mg in sodium chloride 0.9 % 100 mL IVPB       "Followed by" Linked Group Details   100 mg 200 mL/hr over 30 Minutes Intravenous Daily 01/11/21 0056 01/16/21 0959   01/11/21 0200  remdesivir 200 mg in sodium chloride 0.9% 250 mL IVPB       "Followed by" Linked Group Details   200 mg 580 mL/hr over 30 Minutes Intravenous Once 01/11/21 0056 01/11/21 0700       Time Spent in minutes  30   Lala Lund M.D on 01/11/2021 at 11:00 AM  To page go to  [email or web address]   [facility] -  Office  [phone]   See all Orders from today for further details    Objective:   Vitals:   01/11/21 0340 01/11/21 0353 01/11/21 0510 01/11/21 0745  BP: (!) 81/61   90/69  Pulse: 63   78  Resp: 14   19  Temp: 97.6 F (36.4 C) (!) 97.4 F (36.3 C)    TempSrc: Oral Oral    SpO2: 96%   96%  Weight:   57.6 kg   Height:   5\' 10"  (1.778 m)     Wt Readings from Last 3 Encounters:  01/11/21 57.6 kg  12/25/20 57.2 kg  12/17/20 59.7 kg     Intake/Output Summary (Last 24 hours) at 01/11/2021 1100 Last data filed at 01/11/2021 1021 Gross per 24 hour  Intake 1241.69 ml  Output --  Net 1241.69 ml     Physical Exam  Awake, mildly confused, No new F.N deficits,   [place].AT,PERRAL Supple Neck,No JVD, No cervical lymphadenopathy appriciated.  Symmetrical Chest wall movement, Good air movement bilaterally, CTAB RRR,No Gallops,Rubs or new Murmurs, No Parasternal Heave +ve B.Sounds, Abd Soft, No tenderness, No organomegaly appriciated, No rebound - guarding or rigidity. No Cyanosis, Clubbing or edema, No new Rash or bruise      Data Review:    CBC Recent Labs  Lab 01/10/21 2152 01/10/21 2201  WBC 6.9  --   HGB 8.0* 8.8*  HCT 26.8* 26.0*  PLT 231  --   MCV 90.8  --   MCH 27.1  --   MCHC 29.9*  --   RDW 16.5*  --   LYMPHSABS 0.6*  --   MONOABS 0.5  --   EOSABS 0.1  --   BASOSABS 0.0  --     Recent Labs  Lab 01/10/21 2152 01/10/21 2201 01/11/21 0921  NA 135 138 135  K 4.1 4.2 4.2  CL 103  --  103  CO2 24  --  19*  GLUCOSE 102*  --  145*  BUN 37*  --  35*  CREATININE 1.79*  --  1.61*  CALCIUM 8.3*  --  8.6*  AST 18  --   --   ALT 13  --   --  ALKPHOS 37*  --   --   BILITOT 0.8  --   --   ALBUMIN 2.9*  --   --   MG 2.0  --  2.0  CRP  --   --  0.6  LATICACIDVEN 0.9  --   --   TSH 9.056*  --   --   AMMONIA 15  --   --   BNP 305.5*  --  504.0*     ------------------------------------------------------------------------------------------------------------------ No results for input(s): CHOL, HDL, LDLCALC, TRIG, CHOLHDL, LDLDIRECT in the last 72 hours.  Lab Results  Component Value Date   HGBA1C  06/11/2007    5.7 (NOTE)   The ADA recommends the following therapeutic goals for glycemic   control related to Hgb A1C measurement:   Goal of Therapy:   < 7.0% Hgb A1C   Action Suggested:  > 8.0% Hgb A1C   Ref:  Diabetes Care, 22, Suppl. 1, 1999   ------------------------------------------------------------------------------------------------------------------ Recent Labs    01/10/21 2152  TSH 9.056*    Cardiac Enzymes No results for input(s): CKMB, TROPONINI, MYOGLOBIN in the last 168 hours.  Invalid input(s): CK ------------------------------------------------------------------------------------------------------------------    Component Value Date/Time   BNP 504.0 (H) 01/11/2021 0921   BNP 379.4 (H) 02/25/2016 0951    Micro Results Recent Results (from the past 240 hour(s))  Resp Panel by RT-PCR (Flu A&B, Covid) Nasopharyngeal Swab     Status: Abnormal   Collection Time: 01/10/21 10:00 PM   Specimen: Nasopharyngeal Swab; Nasopharyngeal(NP) swabs in vial transport medium  Result Value Ref Range Status   SARS Coronavirus 2 by RT PCR POSITIVE (A) NEGATIVE Final    Comment: RESULT CALLED TO, READ BACK BY AND VERIFIED WITH: K. SETRAM,RN 0006 01/11/2021 T. TYSOR (NOTE) SARS-CoV-2 target nucleic acids are DETECTED.  The SARS-CoV-2 RNA is generally detectable in upper respiratory specimens during the acute phase of infection. Positive results are indicative of the presence of the identified virus, but do not rule out bacterial infection or co-infection with other pathogens not detected by the test. Clinical correlation with patient history and other diagnostic information is necessary to determine patient infection status.  The expected result is Negative.  Fact Sheet for Patients: EntrepreneurPulse.com.au  Fact Sheet for Healthcare Providers: IncredibleEmployment.be  This test is not yet approved or cleared by the [place] FDA and  has been authorized for detection and/or diagnosis of SARS-CoV-2 by FDA under an Emergency Use Authorization (EUA).  This EUA will remain in effect (meaning this test ca n be used) for the duration of  the COVID-19 declaration under Section 564(b)(1) of the Act, 21 U.S.C. section 360bbb-3(b)(1), unless the authorization is terminated or revoked sooner.     Influenza A by PCR NEGATIVE NEGATIVE Final   Influenza B by PCR NEGATIVE NEGATIVE Final    Comment: (NOTE) The Xpert Xpress SARS-CoV-2/FLU/RSV plus assay is intended as an aid in the diagnosis of influenza from Nasopharyngeal swab specimens and should not be used as a sole basis for treatment. Nasal washings and aspirates are unacceptable for Xpert Xpress SARS-CoV-2/FLU/RSV testing.  Fact Sheet for Patients: EntrepreneurPulse.com.au  Fact Sheet for Healthcare Providers: IncredibleEmployment.be  This test is not yet approved or cleared by the [place] FDA and has been authorized for detection and/or diagnosis of SARS-CoV-2 by FDA under an Emergency Use Authorization (EUA). This EUA will remain in effect (meaning this test can be used) for the duration of the COVID-19 declaration under Section 564(b)(1) of the Act, 21 U.S.C. section  360bbb-3(b)(1), unless the authorization is terminated or revoked.  Performed at [facility], [place] [address], [place], [place] [address]   MRSA PCR Screening     Status: None   Collection Time: 01/11/21  7:48 AM   Specimen: Nasal Mucosa; Nasopharyngeal  Result Value Ref Range Status   MRSA by PCR NEGATIVE NEGATIVE Final    Comment:        The GeneXpert MRSA Assay (FDA approved for NASAL  specimens only), is one component of a comprehensive MRSA colonization surveillance program. It is not intended to diagnose MRSA infection nor to guide or monitor treatment for MRSA infections. Performed at [facility], [place] [address], [place], [place] [address]     Radiology Reports CT Head Wo Contrast  Result Date: 01/10/2021 CLINICAL DATA:  Mental status changes of unknown cause. EXAM: CT HEAD WITHOUT CONTRAST TECHNIQUE: Contiguous axial images were obtained from the base of the skull through the vertex without intravenous contrast. COMPARISON:  10/14/2013 FINDINGS: Brain: Age related generalized atrophy. Chronic small-vessel ischemic changes of the cerebral hemispheric white matter. No sign of acute infarction, mass lesion, hemorrhage, hydrocephalus or extra-axial collection. Vascular: There is atherosclerotic calcification of the major vessels at the base of the brain. Skull: Negative Sinuses/Orbits: Clear/normal Other: None IMPRESSION: No acute finding by CT. Age related atrophy and chronic small-vessel ischemic changes of the white matter. Electronically Signed   By: Nelson Chimes M.D.   On: 01/10/2021 21:14   CT Angio Chest PE W and/or Wo Contrast  Result Date: 01/10/2021 CLINICAL DATA:  PE suspected, high prob Weakness and lethargy. EXAM: CT ANGIOGRAPHY CHEST WITH CONTRAST TECHNIQUE: Multidetector CT imaging of the chest was performed using the standard protocol during bolus administration of intravenous contrast. Multiplanar CT image reconstructions and MIPs were obtained to evaluate the vascular anatomy. CONTRAST:  91mL OMNIPAQUE IOHEXOL 350 MG/ML SOLN COMPARISON:  Radiograph earlier today.  Chest CT 10/14/2013 FINDINGS: Cardiovascular: There are no filling defects within the pulmonary arteries to suggest pulmonary embolus. Left-sided pacemaker in place. Aortic atherosclerosis and tortuosity. Multi chamber cardiomegaly with contrast refluxing into the hepatic veins and IVC. No  pericardial effusion. Post CABG with native coronary artery calcification. Contrast refluxes into chest wall collaterals in the azygos system. Mediastinum/Nodes: Shotty mediastinal lymph nodes, all subcentimeter. No hilar adenopathy. Decompressed esophagus. No thyroid nodule. Lungs/Pleura: Small bilateral pleural effusions, right greater than left. Scattered reticular and ground-glass opacities throughout both lungs, likely representing scarring, many at site of prior nodular airspace disease. Small amount of fluid in the right major fissure, with questionable fissural nodularity. Trachea and central bronchi are patent. No pulmonary mass. Upper Abdomen: Assessed on concurrent abdominal CT, reported separately. Musculoskeletal: Scoliosis and ordinary degenerative change in the spine. There are no acute or suspicious osseous abnormalities. Review of the MIP images confirms the above findings. IMPRESSION: 1. No pulmonary embolus. 2. Multi chamber cardiomegaly with contrast refluxing into the hepatic veins and IVC consistent with elevated right heart pressures. Small bilateral pleural effusions, right greater than left. 3. Scattered reticular and ground-glass opacities throughout both lungs, likely representing scarring, many at site of prior nodular airspace disease. Pneumonia is not entirely excluded, recommend correlation for acute infective symptoms. Aortic Atherosclerosis (ICD10-I70.0). Electronically Signed   By: Keith Rake M.D.   On: 01/10/2021 23:45   CT ABDOMEN PELVIS W CONTRAST  Result Date: 01/10/2021 CLINICAL DATA:  Epigastric abdominal pain. Unexplained hypotension. Lethargy and weakness. EXAM: CT ABDOMEN AND PELVIS WITH CONTRAST TECHNIQUE: Multidetector CT  imaging of the abdomen and pelvis was performed using the standard protocol following bolus administration of intravenous contrast. CONTRAST:  40mL OMNIPAQUE IOHEXOL 350 MG/ML SOLN COMPARISON:  CT 419 FINDINGS: Lower chest: Small pleural  effusions and multi chamber cardiomegaly. Detailed assessment on concurrent chest CT, reported separately. Hepatobiliary: No focal liver abnormality. Post cholecystectomy. There is chronic biliary dilatation with mid common bile duct measuring 15 mm. This appears similar to prior exam. There is central intrahepatic biliary ductal dilatation. No evidence of choledocholithiasis. Pancreas: Parenchymal atrophy. No ductal dilatation or inflammation. Spleen: Normal in size without focal abnormality. Surgical clips at the hilum. Adrenals/Urinary Tract: Stable left adrenal thickening. Normal right adrenal gland. No suspicious nodule. No hydronephrosis or perinephric edema. Small cysts in the upper right kidney, as well as low-density lesions too small to characterize. Partially exophytic enhancing lesion from the posterior left kidney measures 14 x 8 mm, previously 13 x 11 mm. Absent renal excretion on delayed phase imaging. Urinary bladder is distended. No bladder wall thickening. Stomach/Bowel: Decompressed stomach. Few fluid-filled bowel small bowel loops in the lower abdomen. Mild hyperemia of a short segment of small bowel in the central abdomen, series 3, image 40. No obstruction. Presumed prior right hemicolectomy with enteric anastomosis in the right upper quadrant. Moderate stool in the distal colon. Surgical clips adjacent to the rectosigmoid. Vascular/Lymphatic: Aortic atherosclerosis and tortuosity. No aortic aneurysm. No acute vascular findings. Patent portal vein. No enlarged lymph nodes in the abdomen or pelvis. Reproductive: Prominent prostate gland. Other: Small amount of ascites in the right and left upper quadrant. No free air or focal fluid collection. Musculoskeletal: Scoliosis and degenerative change throughout the spine. There are no acute or suspicious osseous abnormalities. IMPRESSION: 1. Mild hyperemia of a short segment of small bowel in the central abdomen, suspicious for enteritis. No  obstruction. 2. Small amount of ascites in the right and left upper quadrant. 3. Absent renal excretion on delayed phase imaging suggest underlying renal dysfunction. 4. Chronic biliary dilatation, unchanged from prior exam, likely related to prior cholecystectomy. Recommend correlation with LFTs. 5. Partially exophytic enhancing lesion from the posterior left kidney measures 14 x 8 mm, previously 13 x 11 mm. This remains concerning for incidental renal neoplasm, however the lack of significant change in size over the course of 2 years suggests this is indolent. Should further workup be pursued, recommend MRI for characterization if patient is able to tolerate breath hold technique, giving consideration for density age. Aortic Atherosclerosis (ICD10-I70.0). Electronically Signed   By: Keith Rake M.D.   On: 01/10/2021 23:53   DG Chest Portable 1 View  Result Date: 01/10/2021 CLINICAL DATA:  Altered mental status EXAM: PORTABLE CHEST 1 VIEW COMPARISON:  December 13, 2020 FINDINGS: There is a dual chamber pacemaker/ICD in place. The heart size is enlarged. There is a small left-sided pleural effusion. There are chronic airspace opacities bilaterally. No pneumothorax. Atelectasis versus scarring is noted at the left lung base. There is no definite acute osseous abnormality. IMPRESSION: 1. Cardiomegaly with small left pleural effusion. 2. Scattered chronic airspace opacities bilaterally favored to represent scarring or atelectasis. Electronically Signed   By: Constance Holster M.D.   On: 01/10/2021 20:47   DG Chest Port 1 View  Result Date: 12/13/2020 CLINICAL DATA:  . Pt's daughter reports seizure-like activity lasting appro 1 minute. No h of seizure. H of CHF, COPD, A-Fib.Fire department arrived prior to EMS reported shallow breathing and unable to obtain carotid pulse EXAM: PORTABLE CHEST 1 VIEW COMPARISON:  12/11/2020 FINDINGS: Stable changes from prior CABG surgery. Cardiac silhouette is mildly  enlarged. Mediastinal or hilar masses. Left anterior chest wall AICD is stable. Prominent interstitial and vascular markings bilaterally. There is additional opacity at the left lung base, which appears more prominent than on the recent prior study. This may reflect atelectasis or infiltrate. Possible small left pleural effusion. No evidence of a right pleural effusion. No pneumothorax. Skeletal structures grossly intact IMPRESSION: 1. Opacity at the left lung base, which may reflect atelectasis or pneumonia. 2. Vascular and interstitial prominence similar to the prior study without convincing pulmonary edema. Possible small left pleural effusion. Electronically Signed   By: Lajean Manes M.D.   On: 12/13/2020 12:44   CUP PACEART INCLINIC DEVICE CHECK  Result Date: 12/25/2020 Pacemaker check in clinic. Normal device function. Thresholds, sensing, impedances consistent with previous measurements. Device programmed to maximize longevity. No new high ventricular rates noted. Device programmed at appropriate safety margins. Histogram distribution appropriate for patient activity level. Device programmed to optimize intrinsic conduction. Estimated longevity _11 years__. Patient enrolled in remote follow-up

## 2021-01-11 NOTE — H&P (Addendum)
History and Physical    CLENNON NASCA MBW:[id number] DOB: [birth year] DOA: 01/10/2021  PCP: Crist Infante, MD  Patient coming from: Rehab.  Chief Complaint: Diarrhea and lethargy.  Low blood pressure.  History obtained from ER physician.  Previous records.  HPI: [name] [name] is a 87 y.o. male with history of proximal atrial fibrillation status post pacemaker placement, cardiomyopathy status post ICD placement last EF was around 35 to 40%, chronic kidney disease stage III, chronic anemia history of colon cancer admitted November for Covid infection and admitted in January last month for VT at that time was placed on amiodarone presently in rehab was found to be lethargic and hypotensive when has been examined diarrhea.  Patient was brought to the ER for above complaints.  Did not have any chest pain or shortness of breath.  ED Course: The ER patient was hypotensive with a systolic in the 01X responded to fluids bolus of 1.5 L.  Labs were remarkable for creatinine 1.7 BNP of 305 troponin of 20 lactic acid was normal.  Hemoglobin is around 8 with stool focal blood negative.  EKG shows A. fib.  CT head was unremarkable CT angiogram of the chest shows no other process concerning for pneumonia.  And CT abdomen was done which shows features concerning for enteritis.  Initially pulmonary critical care was consulted due to hypotension since patient's blood pressure improved with fluids hospital recommended admission.  Patient was started on remdesivir and Decadron for Covid infection and was given fluid for hypotension renal failure.  Heparin was started for anticoagulation.  Review of Systems: As per HPI, rest all negative.   Past Medical History:  Diagnosis Date  . CAD (coronary artery disease)    Old inferolateral MI s/p CABG in 2002  . CAP (community acquired pneumonia) 10/03/2011  . Cardiomyopathy    s/p ICD 09/30/08; EF 35 to 40% per echo in October 2021  . Chronic systolic CHF (congestive  heart failure) ([place]) 07/24/2013  . Colon cancer ([place])    s/p chemo, RT, sugery  . Colorectal anastomotic stricture   . Diverticulosis   . ED (erectile dysfunction)   . GERD (gastroesophageal reflux disease)    rarely  . HTN (hypertension)   . ICD (implantable cardiac defibrillator) discharge October 2012   removed 2016    now only PPM  . Iron deficiency anemia   . Paroxysmal atrial fibrillation (HCC)    controlled with amiodarone, not a coumadin candidate due to GI bleeding  . V tach ([place]) 12/14/2020    Past Surgical History:  Procedure Laterality Date  . APPENDECTOMY    . BACK SURGERY    . BIOPSY  09/22/2018   Procedure: BIOPSY;  Surgeon: Gatha Mayer, MD;  Location: [facility];  Service: Endoscopy;;  . CARDIAC CATHETERIZATION  11/07/2000   EF 32%  . CARDIAC DEFIBRILLATOR PLACEMENT  09/30/08   MDT dual chamber ICD placed by Dr Rayann Heman  . CARDIOVASCULAR STRESS TEST  08/06/2008   EF 30%  . CHOLECYSTECTOMY    . COLON RESECTION     x2  . COLONOSCOPY     multiple  . COLONOSCOPY N/A 09/22/2018   Procedure: COLONOSCOPY;  Surgeon: Gatha Mayer, MD;  Location: [facility];  Service: Endoscopy;  Laterality: N/A;  . CORONARY ARTERY BYPASS GRAFT    . EP IMPLANTABLE DEVICE N/A 10/15/2015   Procedure:  PPM Generator Changeout;  Surgeon: [name], MD;  Location: [facility];  Service: Cardiovascular;  Laterality:  N/A;  . HERNIA REPAIR    . US ECHOCARDIOGRAPHY  08/11/2008   EF 30-35%  . VASECTOMY       reports that he has quit smoking. His smoking use included cigars. He has never used smokeless tobacco. He reports that he does not drink alcohol and does not use drugs.  Allergies  Allergen Reactions  . Iron     Stomach issues  . Lescol [Fluvastatin Sodium]     Dizziness  . Pentazocine Lactate     unknown  . Vytorin [Ezetimibe-Simvastatin]     Leg cramps   . Aspirin     Bleeding     Family History  Problem Relation Age of Onset  . Colon cancer Father    . Diabetes Sister   . Colon polyps Brother   . Pancreatic cancer Brother 39    Prior to Admission medications   Medication Sig Start Date End Date Taking? Authorizing Provider  amiodarone (PACERONE) 200 MG tablet Take 2 tablets (400 mg total) by mouth 2 (two) times daily. START with 400mg  PO BID for 10 days THEN reduce to 400mg  PO daily for 10 days THEN reduce to 200mg  PO daily 12/17/20   Baldwin Jamaica, PA-C  BESIVANCE 0.6 % SUSP Place 1 drop into the right eye 3 (three) times daily. 08/23/18   [provider]  cholecalciferol (VITAMIN D) 1000 UNITS tablet Take 1,000 Units by mouth daily.    [provider]  diphenoxylate-atropine (LOMOTIL) 2.5-0.025 MG tablet Take 1 tablet by mouth 4 (four) times daily as needed for diarrhea or loose stools.  12/27/19   [provider]  DUREZOL 0.05 % EMUL Place 1 drop into the right eye 3 (three) times daily. 08/23/18   [provider]  furosemide (LASIX) 40 MG tablet Take 1 tablet (40 mg total) by mouth daily. 12/10/20   [name], Peter M, MD  Magnesium Oxide 200 MG TABS Take 1 tablet (200 mg total) by mouth daily. 12/17/20   Baldwin Jamaica, PA-C  metoprolol succinate (TOPROL-XL) 100 MG 24 hr tablet TAKE 1 TABLET (100 MG TOTAL) BY MOUTH DAILY WITH OR IMMEDIATELY FOLLOWING A MEAL Patient taking differently: Take 100 mg by mouth daily. 01/08/20   Almyra Deforest, PA  Multiple Vitamins-Minerals (MULTIVITAMINS THER. W/MINERALS) TABS Take 1 tablet by mouth daily.    [provider]  nitroGLYCERIN (NITROSTAT) 0.4 MG SL tablet Place 1 tablet (0.4 mg total) under the tongue every 5 (five) minutes as needed for chest pain. 05/07/14   [name], Peter M, MD  pantoprazole (PROTONIX) 40 MG tablet Take 1 tablet (40 mg total) by mouth 2 (two) times daily. 11/02/20   Thurnell Lose, MD  PROAIR HFA 108 (90 BASE) MCG/ACT inhaler Inhale 2 puffs into the lungs every 4 (four) hours as needed for shortness of breath.  10/22/13   [provider]  ramipril (ALTACE) 2.5 MG capsule Take 1 capsule (2.5 mg total) by mouth daily. 11/02/20   Thurnell Lose, MD  spironolactone (ALDACTONE) 25 MG tablet TAKE 1/2 TABLET EVERY DAY (NEED MD APPOINTMENT) Patient taking differently: Take 12.5 mg by mouth daily. 10/12/20   Allred, Jeneen Rinks, MD  tamsulosin (FLOMAX) 0.4 MG CAPS capsule Take 1 capsule (0.4 mg total) by mouth daily. 11/02/20   Thurnell Lose, MD  vitamin B-12 (CYANOCOBALAMIN) 1000 MCG tablet Take 1,000 mcg by mouth daily.    [provider]    Physical Exam: Constitutional: Moderately built and nourished. Vitals:   01/10/21  2215 01/10/21 2230 01/10/21 2345 01/11/21 0130  BP: (!) 81/59 (!) 78/58 (!) 86/65 120/81  Pulse: 70 85 80 96  Resp: 18 19 (!) 23 18  Temp:  (!) 97.4 F (36.3 C)    TempSrc:  Rectal    SpO2: 96% 97% 99% 94%   Eyes: Anicteric no pallor. ENMT: No discharge from the ears eyes nose or mouth. Neck: No muscle.  No neck rigidity.  No JVD appreciated. Respiratory: No rhonchi or crepitations. Cardiovascular: S1-S2 heard. Abdomen: Soft nontender bowel sounds present. Musculoskeletal: No edema. Skin: No rash. Neurologic: Alert awake oriented to his name and place moving all extremities. Psychiatric: Oriented to his name and place.   Labs on Admission: I have personally reviewed following labs and imaging studies  CBC: Recent Labs  Lab 01/10/21 2152 01/10/21 2201  WBC 6.9  --   NEUTROABS 5.7  --   HGB 8.0* 8.8*  HCT 26.8* 26.0*  MCV 90.8  --   PLT 231  --    Basic Metabolic Panel: Recent Labs  Lab 01/10/21 2152 01/10/21 2201  NA 135 138  K 4.1 4.2  CL 103  --   CO2 24  --   GLUCOSE 102*  --   BUN 37*  --   CREATININE 1.79*  --   CALCIUM 8.3*  --   MG 2.0  --    GFR: CrCl cannot be calculated (Unknown ideal weight.). Liver Function Tests: Recent Labs  Lab 01/10/21 2152  AST 18  ALT 13  ALKPHOS 37*  BILITOT 0.8  PROT 5.6*  ALBUMIN 2.9*   Recent Labs  Lab  01/10/21 2152  LIPASE 22   Recent Labs  Lab 01/10/21 2152  AMMONIA 15   Coagulation Profile: No results for input(s): INR, PROTIME in the last 168 hours. Cardiac Enzymes: No results for input(s): CKTOTAL, CKMB, CKMBINDEX, TROPONINI in the last 168 hours. BNP (last 3 results) No results for input(s): PROBNP in the last 8760 hours. HbA1C: No results for input(s): HGBA1C in the last 72 hours. CBG: No results for input(s): GLUCAP in the last 168 hours. Lipid Profile: No results for input(s): CHOL, HDL, LDLCALC, TRIG, CHOLHDL, LDLDIRECT in the last 72 hours. Thyroid Function Tests: Recent Labs    01/10/21 2152  TSH 9.056*   Anemia Panel: No results for input(s): VITAMINB12, FOLATE, FERRITIN, TIBC, IRON, RETICCTPCT in the last 72 hours. Urine analysis:    Component Value Date/Time   COLORURINE YELLOW 12/13/2020 1612   APPEARANCEUR CLEAR 12/13/2020 1612   LABSPEC 1.008 12/13/2020 1612   PHURINE 5.0 12/13/2020 1612   GLUCOSEU NEGATIVE 12/13/2020 1612   HGBUR NEGATIVE 12/13/2020 1612   BILIRUBINUR NEGATIVE 12/13/2020 1612   KETONESUR NEGATIVE 12/13/2020 1612   PROTEINUR 30 (A) 12/13/2020 1612   UROBILINOGEN 1.0 10/14/2013 1510   NITRITE NEGATIVE 12/13/2020 1612   LEUKOCYTESUR NEGATIVE 12/13/2020 1612   Sepsis Labs: @LABRCNTIP (procalcitonin:4,lacticidven:4) ) Recent Results (from the past 240 hour(s))  Resp Panel by RT-PCR (Flu A&B, Covid) Nasopharyngeal Swab     Status: Abnormal   Collection Time: 01/10/21 10:00 PM   Specimen: Nasopharyngeal Swab; Nasopharyngeal(NP) swabs in vial transport medium  Result Value Ref Range Status   SARS Coronavirus 2 by RT PCR POSITIVE (A) NEGATIVE Final    Comment: RESULT CALLED TO, READ BACK BY AND VERIFIED WITH: K. SETRAM,RN 0006 01/11/2021 T. TYSOR (NOTE) SARS-CoV-2 target nucleic acids are DETECTED.  The SARS-CoV-2 RNA is generally detectable in upper respiratory specimens during the acute phase of infection.  Positive results  are indicative of the presence of the identified virus, but do not rule out bacterial infection or co-infection with other pathogens not detected by the test. Clinical correlation with patient history and other diagnostic information is necessary to determine patient infection status. The expected result is Negative.  Fact Sheet for Patients: EntrepreneurPulse.com.au  Fact Sheet for Healthcare Providers: IncredibleEmployment.be  This test is not yet approved or cleared by the [place] FDA and  has been authorized for detection and/or diagnosis of SARS-CoV-2 by FDA under an Emergency Use Authorization (EUA).  This EUA will remain in effect (meaning this test ca n be used) for the duration of  the COVID-19 declaration under Section 564(b)(1) of the Act, 21 U.S.C. section 360bbb-3(b)(1), unless the authorization is terminated or revoked sooner.     Influenza A by PCR NEGATIVE NEGATIVE Final   Influenza B by PCR NEGATIVE NEGATIVE Final    Comment: (NOTE) The Xpert Xpress SARS-CoV-2/FLU/RSV plus assay is intended as an aid in the diagnosis of influenza from Nasopharyngeal swab specimens and should not be used as a sole basis for treatment. Nasal washings and aspirates are unacceptable for Xpert Xpress SARS-CoV-2/FLU/RSV testing.  Fact Sheet for Patients: EntrepreneurPulse.com.au  Fact Sheet for Healthcare Providers: IncredibleEmployment.be  This test is not yet approved or cleared by the [place] FDA and has been authorized for detection and/or diagnosis of SARS-CoV-2 by FDA under an Emergency Use Authorization (EUA). This EUA will remain in effect (meaning this test can be used) for the duration of the COVID-19 declaration under Section 564(b)(1) of the Act, 21 U.S.C. section 360bbb-3(b)(1), unless the authorization is terminated or revoked.  Performed at [facility], [place] [address],  [place], [place] [address]      Radiological Exams on Admission: CT Head Wo Contrast  Result Date: 01/10/2021 CLINICAL DATA:  Mental status changes of unknown cause. EXAM: CT HEAD WITHOUT CONTRAST TECHNIQUE: Contiguous axial images were obtained from the base of the skull through the vertex without intravenous contrast. COMPARISON:  10/14/2013 FINDINGS: Brain: Age related generalized atrophy. Chronic small-vessel ischemic changes of the cerebral hemispheric white matter. No sign of acute infarction, mass lesion, hemorrhage, hydrocephalus or extra-axial collection. Vascular: There is atherosclerotic calcification of the major vessels at the base of the brain. Skull: Negative Sinuses/Orbits: Clear/normal Other: None IMPRESSION: No acute finding by CT. Age related atrophy and chronic small-vessel ischemic changes of the white matter. Electronically Signed   By: Nelson Chimes M.D.   On: 01/10/2021 21:14   CT Angio Chest PE W and/or Wo Contrast  Result Date: 01/10/2021 CLINICAL DATA:  PE suspected, high prob Weakness and lethargy. EXAM: CT ANGIOGRAPHY CHEST WITH CONTRAST TECHNIQUE: Multidetector CT imaging of the chest was performed using the standard protocol during bolus administration of intravenous contrast. Multiplanar CT image reconstructions and MIPs were obtained to evaluate the vascular anatomy. CONTRAST:  46mL OMNIPAQUE IOHEXOL 350 MG/ML SOLN COMPARISON:  Radiograph earlier today.  Chest CT 10/14/2013 FINDINGS: Cardiovascular: There are no filling defects within the pulmonary arteries to suggest pulmonary embolus. Left-sided pacemaker in place. Aortic atherosclerosis and tortuosity. Multi chamber cardiomegaly with contrast refluxing into the hepatic veins and IVC. No pericardial effusion. Post CABG with native coronary artery calcification. Contrast refluxes into chest wall collaterals in the azygos system. Mediastinum/Nodes: Shotty mediastinal lymph nodes, all subcentimeter. No hilar adenopathy.  Decompressed esophagus. No thyroid nodule. Lungs/Pleura: Small bilateral pleural effusions, right greater than left. Scattered reticular and ground-glass opacities throughout both lungs, likely  representing scarring, many at site of prior nodular airspace disease. Small amount of fluid in the right major fissure, with questionable fissural nodularity. Trachea and central bronchi are patent. No pulmonary mass. Upper Abdomen: Assessed on concurrent abdominal CT, reported separately. Musculoskeletal: Scoliosis and ordinary degenerative change in the spine. There are no acute or suspicious osseous abnormalities. Review of the MIP images confirms the above findings. IMPRESSION: 1. No pulmonary embolus. 2. Multi chamber cardiomegaly with contrast refluxing into the hepatic veins and IVC consistent with elevated right heart pressures. Small bilateral pleural effusions, right greater than left. 3. Scattered reticular and ground-glass opacities throughout both lungs, likely representing scarring, many at site of prior nodular airspace disease. Pneumonia is not entirely excluded, recommend correlation for acute infective symptoms. Aortic Atherosclerosis (ICD10-I70.0). Electronically Signed   By: Keith Rake M.D.   On: 01/10/2021 23:45   CT ABDOMEN PELVIS W CONTRAST  Result Date: 01/10/2021 CLINICAL DATA:  Epigastric abdominal pain. Unexplained hypotension. Lethargy and weakness. EXAM: CT ABDOMEN AND PELVIS WITH CONTRAST TECHNIQUE: Multidetector CT imaging of the abdomen and pelvis was performed using the standard protocol following bolus administration of intravenous contrast. CONTRAST:  68mL OMNIPAQUE IOHEXOL 350 MG/ML SOLN COMPARISON:  CT 419 FINDINGS: Lower chest: Small pleural effusions and multi chamber cardiomegaly. Detailed assessment on concurrent chest CT, reported separately. Hepatobiliary: No focal liver abnormality. Post cholecystectomy. There is chronic biliary dilatation with mid common bile duct  measuring 15 mm. This appears similar to prior exam. There is central intrahepatic biliary ductal dilatation. No evidence of choledocholithiasis. Pancreas: Parenchymal atrophy. No ductal dilatation or inflammation. Spleen: Normal in size without focal abnormality. Surgical clips at the hilum. Adrenals/Urinary Tract: Stable left adrenal thickening. Normal right adrenal gland. No suspicious nodule. No hydronephrosis or perinephric edema. Small cysts in the upper right kidney, as well as low-density lesions too small to characterize. Partially exophytic enhancing lesion from the posterior left kidney measures 14 x 8 mm, previously 13 x 11 mm. Absent renal excretion on delayed phase imaging. Urinary bladder is distended. No bladder wall thickening. Stomach/Bowel: Decompressed stomach. Few fluid-filled bowel small bowel loops in the lower abdomen. Mild hyperemia of a short segment of small bowel in the central abdomen, series 3, image 40. No obstruction. Presumed prior right hemicolectomy with enteric anastomosis in the right upper quadrant. Moderate stool in the distal colon. Surgical clips adjacent to the rectosigmoid. Vascular/Lymphatic: Aortic atherosclerosis and tortuosity. No aortic aneurysm. No acute vascular findings. Patent portal vein. No enlarged lymph nodes in the abdomen or pelvis. Reproductive: Prominent prostate gland. Other: Small amount of ascites in the right and left upper quadrant. No free air or focal fluid collection. Musculoskeletal: Scoliosis and degenerative change throughout the spine. There are no acute or suspicious osseous abnormalities. IMPRESSION: 1. Mild hyperemia of a short segment of small bowel in the central abdomen, suspicious for enteritis. No obstruction. 2. Small amount of ascites in the right and left upper quadrant. 3. Absent renal excretion on delayed phase imaging suggest underlying renal dysfunction. 4. Chronic biliary dilatation, unchanged from prior exam, likely related to  prior cholecystectomy. Recommend correlation with LFTs. 5. Partially exophytic enhancing lesion from the posterior left kidney measures 14 x 8 mm, previously 13 x 11 mm. This remains concerning for incidental renal neoplasm, however the lack of significant change in size over the course of 2 years suggests this is indolent. Should further workup be pursued, recommend MRI for characterization if patient is able to tolerate breath hold technique, giving consideration for  density age. Aortic Atherosclerosis (ICD10-I70.0). Electronically Signed   By: Keith Rake M.D.   On: 01/10/2021 23:53   DG Chest Portable 1 View  Result Date: 01/10/2021 CLINICAL DATA:  Altered mental status EXAM: PORTABLE CHEST 1 VIEW COMPARISON:  December 13, 2020 FINDINGS: There is a dual chamber pacemaker/ICD in place. The heart size is enlarged. There is a small left-sided pleural effusion. There are chronic airspace opacities bilaterally. No pneumothorax. Atelectasis versus scarring is noted at the left lung base. There is no definite acute osseous abnormality. IMPRESSION: 1. Cardiomegaly with small left pleural effusion. 2. Scattered chronic airspace opacities bilaterally favored to represent scarring or atelectasis. Electronically Signed   By: Constance Holster M.D.   On: 01/10/2021 20:47    EKG: Independently reviewed.  A. fib.  Rate controlled.  Assessment/Plan Principal Problem:   Hypotension due to hypovolemia Active Problems:   A-fib (HCC)   Ischemic cardiomyopathy   Acute on chronic systolic (congestive) heart failure (HCC)   COVID-19 virus infection   Hypothyroidism   Dehydration   Hypotension    1. Hypotension likely from hypovolemia for which patient is responding to fluids.  Closely monitor.  Will hold antihypertensives and diuretics for now.  No definite signs of sepsis at this time.  Hypokalemia could be from diarrhea. 2. Covid infection with CT angiogram showing positive for pneumonia.  Appreciate  pulmonary consult.  Patient is already started on Decadron and remdesivir.  Follow inflammatory markers and respiratory status. 3. Acute on chronic kidney disease stage III creatinine increased from 1.4 at baseline last month hiatus around 1.7.  Recheck creatinine of hydration.  Likely from diarrhea and dehydration and hypotension. 4. Diarrhea could be from history of colon cancer resection also from Covid infection. 5. History of A. fib presently rate controlled.  Patient was not on anticoagulation with history of GI bleed.  At this time heparin has been started by pulmonary critical care.  Will need to verify home medications. 6. History of cardiomyopathy last EF measured was 25 to 30% in October 17, 1820.  Presently receiving fluids for hydration.  Status post AICD placement. 7. Chronic anemia follow CBC.  Continue on heparin. 8. Hypothyroidism on Synthroid.  Follow-up thyroid function test. 9. Abnormal left kidney lesion will need further work-up as outpatient.  Home medication needs to be verified.  Since patient has hypotension with Covid infection will need close monitoring for any further worsening in inpatient status.   DVT prophylaxis: Heparin infusion. Code Status: Full code. Family Communication: Need to discuss with family. Disposition Plan: To be verified. Consults called: Pulmonary critical care and cardiology. Admission status: Inpatient.   Rise Patience MD [facility] Pager [phone].  If 7PM-7AM, please contact night-coverage [email or web address] Password [facility]  01/11/2021, 3:36 AM

## 2021-01-11 NOTE — Consult Note (Signed)
Cardiology Consultation:   Due to the COVID-19 pandemic, the initial part of this visit was completed with telemedicine (audio) technology to reduce patient and provider exposure.  Patient ID: RANVEER WAHLSTROM MRN: [id number]; DOB: 1932-09-01  Admit date: 01/10/2021 Date of Consult: 01/11/2021  Primary Care Provider: Crist Infante, MD Primary Cardiologist: Peter [name], MD  Primary Electrophysiologist:  [name], MD    Patient Profile:   [name] [name] is a 87 y.o. male with a hx of CAD with prior inferolateral MI s/p CABG 9622, chronic systolic CHF, ICM, ICD changed to PPM only at [facility] 2016 (due to 11% V pacing), permanent atrial fibrillation (not on anticoagulation due to prior recurrent GI bleeding), Covid-19 09/2020 (biventricular dysfunction, severe pulm HTN, severe MR at that time), ventricular tachycardia 11/2020, suspected CKD stage III by labs, colon CA s/p prior resection with some degree of chronic diarrhea, chronic anemia who is being seen today for the evaluation of hypotension at the request of Dr. Hal Hope.  History of Present Illness:   Mr. Savage has had a tumultuous few months, with prior cardiac history outlined above. He has had variable LV function over the past few years. His afib is described as permanent with a focus on rate control, not a candidate for Watchman or anticoag due to his recurrent GI bleeding. In 09/2020 he was admitted with weakness, lethargy, fever, found to be in [facility] with GIB, Covid + and acute urinary retention. He had been previously vaccinated but not boosted at that time. He was cardioverted to afib in the ER and started on IV amiodarone for a period of time that was eventually discontinued that admission. He was treated with steroids and remdesivir. 2D echo during that admission showed EF 25-30% (down from 40-45% in 2017), global HK also with severe akinesis of the left ventricular, basal-mid inferior and inferolateral walls, moderately reduced RV  function, severely elevated PA pressure (74.29mmHg), massively dilated LA, moderately dilated RA, tethering of the mitral valve posterior leaflet with severe mitral regurgitation, moderate TR, mild AI, and dilated IVC. He was readmitted 12/13/20 with a syncopal episode at home - was lethargic at home, improved on arrival, but developed VT. While being prepped for cardioversion he spontaneously converted. He was placed back on amiodarone but then developed recurrent VT so was started on IV lidocaine as well. At discharge he was transitioned to oral amiodarone as well as magnesium. He was not felt to be a candidate for ICD due to advanced age. During his stay he had intermittent confusion and was felt to require rehab at discharge. At his 12/25/20 with EP APP he was felt to be doing better. His blood pressure was soft at 94/56 with plan for cautious continuation of regimen and continued scheduled down-titration of his amiodarone.   He presented to the hospital from [facility] with increasing dyspnea, irritability, confusion, nonblooody diarrhea, and decrease oral intake in the few days preceding his stay. History is primarily obtained in talking to his daughter [name] and the chart. [name] is on the DPR from our office and historically has attended his recent visits with him. She states a week ago she noticed he was more SOB and weak than usual but CXR was reportedly normal. This weekend she noticed he was much more confused than usual - at baseline she states he only usually gets confused when he's ill or in the hospital. He also had looser stool than his typical chronic diarrhea. They also got word of a Covid  case in the center he was at. He was found to be hypotensive with SBP in the 70s and was brought to the ED. He was found to be Covid positive again (with negative interim test between last infection and now). Labs showed AKI on CKD with Cr 1.79 (previously 1.44), Hgb 8.0, hemoccult negative, TSH 9, BNP  305, hsTroponin 20 -> 17. CT angio ruled out PE but did show multi chamber cardiomegaly with contrast refluxing into the hepatic veins and IVC consistent with elevated right heart pressures, small bilateral pleural effusions, scattered reticular and ground glass opacities - scarring vs PNA. CT abdomen showed findings below with suspicion for enteritis, mild ascites, chronic biliary dilation, and partially exophytic renal lesion concerning for incidental renal neoplasm but with lack of change over 2 years' course so felt indolent. He was treated with IV fluids. His Lasix, ramipril and spironolactone are currently on hold. With supportive care his BP has improved to 97/64.    Past Medical History:  Diagnosis Date  . CAD (coronary artery disease)    Old inferolateral MI s/p CABG in 2002  . CAP (community acquired pneumonia) 10/03/2011  . Cardiomyopathy    s/p ICD 09/30/08; EF 35 to 40% per echo in October 2021  . Chronic systolic CHF (congestive heart failure) ([place]) 07/24/2013  . Colon cancer ([place])    s/p chemo, RT, sugery  . Colorectal anastomotic stricture   . Diverticulosis   . ED (erectile dysfunction)   . GERD (gastroesophageal reflux disease)    rarely  . HTN (hypertension)   . ICD (implantable cardiac defibrillator) discharge October 2012   removed 2016    now only PPM  . Iron deficiency anemia   . Paroxysmal atrial fibrillation (HCC)    controlled with amiodarone, not a coumadin candidate due to GI bleeding  . V tach ([place]) 12/14/2020    Past Surgical History:  Procedure Laterality Date  . APPENDECTOMY    . BACK SURGERY    . BIOPSY  09/22/2018   Procedure: BIOPSY;  Surgeon: Gatha Mayer, MD;  Location: [facility];  Service: Endoscopy;;  . CARDIAC CATHETERIZATION  11/07/2000   EF 32%  . CARDIAC DEFIBRILLATOR PLACEMENT  09/30/08   MDT dual chamber ICD placed by Dr Rayann Heman  . CARDIOVASCULAR STRESS TEST  08/06/2008   EF 30%  . CHOLECYSTECTOMY    . COLON RESECTION     x2  .  COLONOSCOPY     multiple  . COLONOSCOPY N/A 09/22/2018   Procedure: COLONOSCOPY;  Surgeon: Gatha Mayer, MD;  Location: [facility];  Service: Endoscopy;  Laterality: N/A;  . CORONARY ARTERY BYPASS GRAFT    . EP IMPLANTABLE DEVICE N/A 10/15/2015   Procedure:  PPM Generator Changeout;  Surgeon: [name], MD;  Location: [facility];  Service: Cardiovascular;  Laterality: N/A;  . HERNIA REPAIR    . US ECHOCARDIOGRAPHY  08/11/2008   EF 30-35%  . VASECTOMY       Home Medications:  Prior to Admission medications   Medication Sig Start Date End Date Taking? Authorizing Provider  amiodarone (PACERONE) 200 MG tablet Take 2 tablets (400 mg total) by mouth 2 (two) times daily. START with 400mg  PO BID for 10 days THEN reduce to 400mg  PO daily for 10 days THEN reduce to 200mg  PO daily Patient taking differently: Take 200 mg by mouth daily. 12/17/20  Yes Baldwin Jamaica, PA-C  BESIVANCE 0.6 % SUSP Place 1 drop into the right eye 3 (three)  times daily. 08/23/18  Yes [provider]  cholecalciferol (VITAMIN D) 1000 UNITS tablet Take 1,000 Units by mouth daily.   Yes [provider]  diphenoxylate-atropine (LOMOTIL) 2.5-0.025 MG tablet Take 1 tablet by mouth 4 (four) times daily as needed for diarrhea or loose stools.  12/27/19  Yes [provider]  DUREZOL 0.05 % EMUL Place 1 drop into the right eye 3 (three) times daily. 08/23/18  Yes [provider]  furosemide (LASIX) 40 MG tablet Take 1 tablet (40 mg total) by mouth daily. 12/10/20  Yes [name], Peter M, MD  Magnesium Oxide 200 MG TABS Take 1 tablet (200 mg total) by mouth daily. 12/17/20  Yes Baldwin Jamaica, PA-C  metoprolol succinate (TOPROL-XL) 100 MG 24 hr tablet TAKE 1 TABLET (100 MG TOTAL) BY MOUTH DAILY WITH OR IMMEDIATELY FOLLOWING A MEAL Patient taking differently: Take 100 mg by mouth daily. 01/08/20  Yes Almyra Deforest, PA  Multiple Vitamins-Minerals (MULTIVITAMINS THER. W/MINERALS) TABS Take 1  tablet by mouth daily.   Yes [provider]  nitroGLYCERIN (NITROSTAT) 0.4 MG SL tablet Place 1 tablet (0.4 mg total) under the tongue every 5 (five) minutes as needed for chest pain. 05/07/14  Yes [name], Peter M, MD  pantoprazole (PROTONIX) 40 MG tablet Take 1 tablet (40 mg total) by mouth 2 (two) times daily. Patient taking differently: Take 40 mg by mouth daily. 11/02/20  Yes Thurnell Lose, MD  PROAIR HFA 108 (90 BASE) MCG/ACT inhaler Inhale 2 puffs into the lungs every 4 (four) hours as needed for shortness of breath.  10/22/13  Yes [provider]  ramipril (ALTACE) 2.5 MG capsule Take 1 capsule (2.5 mg total) by mouth daily. 11/02/20  Yes Thurnell Lose, MD  spironolactone (ALDACTONE) 25 MG tablet TAKE 1/2 TABLET EVERY DAY (NEED MD APPOINTMENT) Patient taking differently: Take 12.5 mg by mouth daily. 10/12/20  Yes Allred, Jeneen Rinks, MD  tamsulosin (FLOMAX) 0.4 MG CAPS capsule Take 1 capsule (0.4 mg total) by mouth daily. 11/02/20  Yes Thurnell Lose, MD  vitamin B-12 (CYANOCOBALAMIN) 1000 MCG tablet Take 1,000 mcg by mouth daily.   Yes [provider]    Inpatient Medications: Scheduled Meds: . amiodarone  200 mg Oral Daily  . vitamin C  1,000 mg Oral TID  . dexamethasone (DECADRON) injection  6 mg Intravenous Q24H  . Difluprednate  1 drop Right Eye TID  . gatifloxacin  1 drop Both Eyes QID  . pantoprazole (PROTONIX) IV  40 mg Intravenous QHS  . zinc sulfate  220 mg Oral Daily   Continuous Infusions: . heparin 800 Units/hr (01/11/21 0844)  . lactated ringers 125 mL/hr at 01/11/21 0753  . [START ON 01/12/2021] remdesivir 100 mg in NS 100 mL     PRN Meds: nitroGLYCERIN, ondansetron (ZOFRAN) IV  Allergies:    Allergies  Allergen Reactions  . Iron     Stomach issues  . Lescol [Fluvastatin Sodium]     Dizziness  . Pentazocine Lactate     unknown  . Vytorin [Ezetimibe-Simvastatin]     Leg cramps   . Aspirin     Bleeding     Social History:    Social History   Socioeconomic History  . Marital status: Married    Spouse name: Not on file  . Number of children: 2  . Years of education: Not on file  . Highest education level: Not on file  Occupational History  . Occupation: Retired     Fish farm manager:  RETIRED  Tobacco Use  . Smoking status: Former Smoker    Types: Cigars  . Smokeless tobacco: Never Used  Vaping Use  . Vaping Use: Never used  Substance and Sexual Activity  . Alcohol use: No  . Drug use: No  . Sexual activity: Not Currently  Other Topics Concern  . Not on file  Social History Narrative   Lives in [place] with spouse.  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 Colon cancer Father   . Diabetes Sister   . Colon polyps Brother   . Pancreatic cancer Brother 39     ROS:  Please see the history of present illness.  All other ROS reviewed and negative.     Physical Exam/Data:   Vitals:   01/11/21 0353 01/11/21 0510 01/11/21 0745 01/11/21 1247  BP:   90/69 (!) 89/62  Pulse:   78 89  Resp:   19 19  Temp: (!) 97.4 F (36.3 C)   (!) 97.3 F (36.3 C)  TempSrc: Oral   Oral  SpO2:   96% 100%  Weight:  57.6 kg    Height:  5\' 10"  (1.778 m)      Intake/Output Summary (Last 24 hours) at 01/11/2021 1407 Last data filed at 01/11/2021 1021 Gross per 24 hour  Intake 1241.69 ml  Output --  Net 1241.69 ml   Last 3 Weights 01/11/2021 12/25/2020 12/17/2020  Weight (lbs) 126 lb 15.8 oz 126 lb 131 lb 9.8 oz  Weight (kg) 57.6 kg 57.153 kg 59.7 kg     Body mass index is 18.22 kg/m.   General -  calm m in no acute distress Pulm - No labored breathing, no coughing during visit, no audible wheezing, speaking in full sentences Neuro - A+Ox3, no slurred speech but very hard of hearing Psych - Pleasant affect  EKG:  The EKG was personally reviewed and demonstrates:  atrial fib 79bpm, NSIVCD, nonspecific STTW changes, QTC 521ms in context of this IVCD  Telemetry:  Telemetry was personally reviewed and demonstrates:  Atrial fib with variable rates  Relevant CV Studies: 2D echo 10/25/20 1. Left ventricular ejection fraction, by estimation, is 25 to 30%. The  left ventricle has severely decreased function. The left ventricle  demonstrates global hypokinesis. Left ventricular diastolic function could  not be evaluated. There is severe  akinesis of the left ventricular, basal-mid inferior and inferolateral  walls. Incoordinate (paced) septal motion.  2. Right ventricular systolic function is moderately reduced. The right  ventricular size is mildly enlarged. There is severely elevated pulmonary  artery systolic pressure. The estimated right ventricular systolic  pressure is 60.6 mmHg.  3. Left atrial size was massively dilated.  4. Right atrial size was moderately dilated.  5. Mild mitral subvalvular calcification.  6. Mild mitral subvalvular thickening/fibrosis.  7. Tethering of the mitral valve posterior leaflet.  8. The mitral valve is abnormal. Severe mitral valve regurgitation.  9. The tricuspid valve is abnormal. Tricuspid valve  regurgitation is  moderate.  10. The aortic valve is tricuspid. Aortic valve regurgitation is mild.  Mild aortic valve sclerosis is present, with no evidence of aortic valve  stenosis.  11. Moderately dilated pulmonary artery.  12. The inferior vena cava is dilated in size with <50% respiratory  variability, suggesting right atrial pressure of 15 mmHg.   Comparison(s): Changes from prior study are noted. 01/04/2016: LVEF 40-45%,  pacer wire,  mild AI, moderate MR, moderate LAE, mild RAE.   Laboratory Data:  Chemistry Recent Labs  Lab 01/10/21 2152 01/10/21 2201 01/11/21 0921  NA 135 138 135  K 4.1 4.2 4.2  CL 103  --  103  CO2 24  --  19*  GLUCOSE 102*  --  145*  BUN 37*  --  35*  CREATININE 1.79*  --  1.61*  CALCIUM 8.3*  --  8.6*  GFRNONAA 36*  --  41*  ANIONGAP 8  --  13    Recent Labs  Lab 01/10/21 2152  PROT 5.6*  ALBUMIN 2.9*  AST 18  ALT 13  ALKPHOS 37*  BILITOT 0.8   Hematology Recent Labs  Lab 01/10/21 2152 01/10/21 2201  WBC 6.9  --   RBC 2.95*  --   HGB 8.0* 8.8*  HCT 26.8* 26.0*  MCV 90.8  --   MCH 27.1  --   MCHC 29.9*  --   RDW 16.5*  --   PLT 231  --    Cardiac EnzymesNo results for input(s): TROPONINI in the last 168 hours. No results for input(s): TROPIPOC in the last 168 hours.  BNP Recent Labs  Lab 01/10/21 2152 01/11/21 0921  BNP 305.5* 504.0*    DDimer  Recent Labs  Lab 01/11/21 0921  DDIMER 1.77*    Radiology/Studies:  CT Head Wo Contrast  Result Date: 01/10/2021 CLINICAL DATA:  Mental status changes of unknown cause. EXAM: CT HEAD WITHOUT CONTRAST TECHNIQUE: Contiguous axial images were obtained from the base of the skull through the vertex without intravenous contrast. COMPARISON:  10/14/2013 FINDINGS: Brain: Age related generalized atrophy. Chronic small-vessel ischemic changes of the cerebral hemispheric white matter. No sign of acute infarction, mass lesion, hemorrhage, hydrocephalus or extra-axial collection. Vascular: There is atherosclerotic calcification of the major vessels at the base of the brain. Skull: Negative Sinuses/Orbits: Clear/normal Other: None IMPRESSION: No acute finding by CT. Age related atrophy and chronic small-vessel ischemic changes of the white matter. Electronically Signed   By: Nelson Chimes M.D.   On: 01/10/2021 21:14   CT Angio Chest PE W and/or Wo Contrast  Result Date: 01/10/2021 CLINICAL DATA:  PE suspected, high prob Weakness and  lethargy. EXAM: CT ANGIOGRAPHY CHEST WITH CONTRAST TECHNIQUE: Multidetector CT imaging of the chest was performed using the standard protocol during bolus administration of intravenous contrast. Multiplanar CT image reconstructions and MIPs were obtained to evaluate the vascular anatomy. CONTRAST:  32mL OMNIPAQUE IOHEXOL 350 MG/ML SOLN COMPARISON:  Radiograph earlier today.  Chest CT 10/14/2013 FINDINGS: Cardiovascular: There are no filling defects within the pulmonary arteries to suggest pulmonary embolus. Left-sided pacemaker in place. Aortic atherosclerosis and tortuosity. Multi chamber cardiomegaly with contrast refluxing into the hepatic veins and IVC. No pericardial effusion. Post CABG with native coronary artery calcification. Contrast refluxes into chest wall collaterals in the azygos system. Mediastinum/Nodes: Shotty mediastinal lymph nodes, all subcentimeter. No hilar adenopathy. Decompressed esophagus. No thyroid nodule. Lungs/Pleura: Small bilateral pleural effusions, right greater than left. Scattered reticular and ground-glass opacities throughout both lungs,  likely representing scarring, many at site of prior nodular airspace disease. Small amount of fluid in the right major fissure, with questionable fissural nodularity. Trachea and central bronchi are patent. No pulmonary mass. Upper Abdomen: Assessed on concurrent abdominal CT, reported separately. Musculoskeletal: Scoliosis and ordinary degenerative change in the spine. There are no acute or suspicious osseous abnormalities. Review of the MIP images confirms the above findings. IMPRESSION: 1. No pulmonary embolus. 2. Multi chamber cardiomegaly with contrast refluxing into the hepatic veins and IVC consistent with elevated right heart pressures. Small bilateral pleural effusions, right greater than left. 3. Scattered reticular and ground-glass opacities throughout both lungs, likely representing scarring, many at site of prior nodular airspace  disease. Pneumonia is not entirely excluded, recommend correlation for acute infective symptoms. Aortic Atherosclerosis (ICD10-I70.0). Electronically Signed   By: Keith Rake M.D.   On: 01/10/2021 23:45   CT ABDOMEN PELVIS W CONTRAST  Result Date: 01/10/2021 CLINICAL DATA:  Epigastric abdominal pain. Unexplained hypotension. Lethargy and weakness. EXAM: CT ABDOMEN AND PELVIS WITH CONTRAST TECHNIQUE: Multidetector CT imaging of the abdomen and pelvis was performed using the standard protocol following bolus administration of intravenous contrast. CONTRAST:  48mL OMNIPAQUE IOHEXOL 350 MG/ML SOLN COMPARISON:  CT 419 FINDINGS: Lower chest: Small pleural effusions and multi chamber cardiomegaly. Detailed assessment on concurrent chest CT, reported separately. Hepatobiliary: No focal liver abnormality. Post cholecystectomy. There is chronic biliary dilatation with mid common bile duct measuring 15 mm. This appears similar to prior exam. There is central intrahepatic biliary ductal dilatation. No evidence of choledocholithiasis. Pancreas: Parenchymal atrophy. No ductal dilatation or inflammation. Spleen: Normal in size without focal abnormality. Surgical clips at the hilum. Adrenals/Urinary Tract: Stable left adrenal thickening. Normal right adrenal gland. No suspicious nodule. No hydronephrosis or perinephric edema. Small cysts in the upper right kidney, as well as low-density lesions too small to characterize. Partially exophytic enhancing lesion from the posterior left kidney measures 14 x 8 mm, previously 13 x 11 mm. Absent renal excretion on delayed phase imaging. Urinary bladder is distended. No bladder wall thickening. Stomach/Bowel: Decompressed stomach. Few fluid-filled bowel small bowel loops in the lower abdomen. Mild hyperemia of a short segment of small bowel in the central abdomen, series 3, image 40. No obstruction. Presumed prior right hemicolectomy with enteric anastomosis in the right upper  quadrant. Moderate stool in the distal colon. Surgical clips adjacent to the rectosigmoid. Vascular/Lymphatic: Aortic atherosclerosis and tortuosity. No aortic aneurysm. No acute vascular findings. Patent portal vein. No enlarged lymph nodes in the abdomen or pelvis. Reproductive: Prominent prostate gland. Other: Small amount of ascites in the right and left upper quadrant. No free air or focal fluid collection. Musculoskeletal: Scoliosis and degenerative change throughout the spine. There are no acute or suspicious osseous abnormalities. IMPRESSION: 1. Mild hyperemia of a short segment of small bowel in the central abdomen, suspicious for enteritis. No obstruction. 2. Small amount of ascites in the right and left upper quadrant. 3. Absent renal excretion on delayed phase imaging suggest underlying renal dysfunction. 4. Chronic biliary dilatation, unchanged from prior exam, likely related to prior cholecystectomy. Recommend correlation with LFTs. 5. Partially exophytic enhancing lesion from the posterior left kidney measures 14 x 8 mm, previously 13 x 11 mm. This remains concerning for incidental renal neoplasm, however the lack of significant change in size over the course of 2 years suggests this is indolent. Should further workup be pursued, recommend MRI for characterization if patient is able to tolerate breath hold technique, giving consideration  for density age. Aortic Atherosclerosis (ICD10-I70.0). Electronically Signed   By: Keith Rake M.D.   On: 01/10/2021 23:53   DG Chest Portable 1 View  Result Date: 01/10/2021 CLINICAL DATA:  Altered mental status EXAM: PORTABLE CHEST 1 VIEW COMPARISON:  December 13, 2020 FINDINGS: There is a dual chamber pacemaker/ICD in place. The heart size is enlarged. There is a small left-sided pleural effusion. There are chronic airspace opacities bilaterally. No pneumothorax. Atelectasis versus scarring is noted at the left lung base. There is no definite acute osseous  abnormality. IMPRESSION: 1. Cardiomegaly with small left pleural effusion. 2. Scattered chronic airspace opacities bilaterally favored to represent scarring or atelectasis. Electronically Signed   By: Constance Holster M.D.   On: 01/10/2021 20:47   DG Abd Portable 1V  Result Date: 01/11/2021 CLINICAL DATA:  Nausea and vomiting in a patient who is COVID positive. EXAM: PORTABLE ABDOMEN - 1 VIEW COMPARISON:  CT chest, abdomen and pelvis yesterday. FINDINGS: The bowel gas pattern is normal. No radio-opaque calculi or other significant radiographic abnormality are seen. Contrast in the urinary bladder from the patient's CT scan is noted. There is marked convex left scoliosis and multilevel degenerative change. Surgical clips there is scattered in the abdomen and pelvis. IMPRESSION: No acute abnormality. Electronically Signed   By: Inge Rise M.D.   On: 01/11/2021 11:16   Assessment and Plan:   1. Hypotension and acute encephalopathy in the context of Covid-19 PNA/gastroenteritis - remdesivir, steroids ordered - supportive care  2. Chronic systolic CHF/ICM - most recent BP is improved back to recent office baseline - given AKI and baseline hypotension, would continue to hold current home regimen and follow clinically - would seem that priority should be given to resumption of beta blocker when ready to avoid rebound tachycardia or breakthrough arrhythmias - no present role noted for inotropes - would be poor candidate for advanced therapies given recent progression of both medical and cardiac illness - I had a long talk with his daughter and she has noticed a significant decline over the last several admissions. We are both concerned about his long term prognosis and continued decline. I discussed the idea of palliative care and she is open to exploring this as a family. As stated above she would prefer to be the main point of contact since she has been involved in the day to day care of her dad  historically. She'd like to get on the same page as her brother in their discussions before they approach her dad with these thoughts. I will place call. Also gave update to Dr. Candiss Norse  3. Permanent atrial fibrillation - rate controlled - previously deemed not a candidate for anticoagulation due to recurrent GI bleeding (as recently as 09/2020) - full dose heparin was ordered on admission but does not seem like there is a role for this - would suggest stopping; will need alternative DVT ppx - will verify plan with MD  4. Ventricular tachycardia, also baseline QT prolongation - continue on home amiodarone as tolerated - MD to review EKG and advise on any dose changes for amiodarone given QT interval in the context of the IVCD - recommend to keep K 4.0 or greater and Mg 2.0 or greater (wnl last check)  5. CAD s/p remote MI/CABG - hsTroponin low and flat - 20->17 arguing against ACS - not on ASA due to GI bleeding, BB on hold as above, not on home statin at this time   6. AKI superimposed on  CKD stage III  - peak Cr 1.79 on arrival, improved to 1.61 today (previous baseline around 1.4)  7. Chronic appearing anemia - per primary team - hemoccult reported to be negative in ER note  8. Abnormal TSH, elevated FT4 - hypothyroidism listed in other notes but not on any medications per [facility] - per IM   For questions or updates, please contact [place] Please consult [email or web address] for contact info under     Signed, Charlie Pitter, PA-C  01/11/2021 2:07 PM  .

## 2021-01-11 NOTE — ED Provider Notes (Signed)
Patient has been seen by critical care Dr. Carson Myrtle He reports patient's blood pressure is improving.  He does not feel patient requires pressors or ICU admit.  He could benefit from gentle hydration  Patient found to have COVID-19 which is new for him.  He had this several months ago, but had tested negative afterwards.  Discussed the case with Dr. Hal Hope for admission.   Ripley Fraise, MD 01/11/21 [phone]

## 2021-01-11 NOTE — Progress Notes (Signed)
ANTICOAGULATION CONSULT NOTE - Initial Consult  Pharmacy Consult for Heparin Indication: atrial fibrillation  Allergies  Allergen Reactions  . Iron     Stomach issues  . Lescol [Fluvastatin Sodium]     Dizziness  . Pentazocine Lactate     unknown  . Vytorin [Ezetimibe-Simvastatin]     Leg cramps   . Aspirin     Bleeding     Patient Measurements:   Heparin Dosing Weight:  57 kg  Vital Signs: Temp: 97.4 F (36.3 C) (02/13 2230) Temp Source: Rectal (02/13 2230) BP: 86/65 (02/13 2345) Pulse Rate: 80 (02/13 2345)  Labs: Recent Labs    01/10/21 2152 01/10/21 2201 01/10/21 2346  HGB 8.0* 8.8*  --   HCT 26.8* 26.0*  --   PLT 231  --   --   CREATININE 1.79*  --   --   TROPONINIHS 20*  --  17    CrCl cannot be calculated (Unknown ideal weight.).   Medical History: Past Medical History:  Diagnosis Date  . CAD (coronary artery disease)    Old inferolateral MI s/p CABG in 2002  . CAP (community acquired pneumonia) 10/03/2011  . Cardiomyopathy    s/p ICD 09/30/08; EF 35 to 40% per echo in October 2021  . Chronic systolic CHF (congestive heart failure) ([place]) 07/24/2013  . Colon cancer ([place])    s/p chemo, RT, sugery  . Colorectal anastomotic stricture   . Diverticulosis   . ED (erectile dysfunction)   . GERD (gastroesophageal reflux disease)    rarely  . HTN (hypertension)   . ICD (implantable cardiac defibrillator) discharge October 2012   removed 2016    now only PPM  . Iron deficiency anemia   . Paroxysmal atrial fibrillation ([facility])    controlled with amiodarone, not a coumadin candidate due to GI bleeding  . V tach ([place]) 12/14/2020    Medications:  No current facility-administered medications on file prior to encounter.   Current Outpatient Medications on File Prior to Encounter  Medication Sig Dispense Refill  . amiodarone (PACERONE) 200 MG tablet Take 2 tablets (400 mg total) by mouth 2 (two) times daily. START with 400mg  PO BID for 10 days THEN reduce  to 400mg  PO daily for 10 days THEN reduce to 200mg  PO daily    . BESIVANCE 0.6 % SUSP Place 1 drop into the right eye 3 (three) times daily.  1  . cholecalciferol (VITAMIN D) 1000 UNITS tablet Take 1,000 Units by mouth daily.    . diphenoxylate-atropine (LOMOTIL) 2.5-0.025 MG tablet Take 1 tablet by mouth 4 (four) times daily as needed for diarrhea or loose stools.     . DUREZOL 0.05 % EMUL Place 1 drop into the right eye 3 (three) times daily.  1  . furosemide (LASIX) 40 MG tablet Take 1 tablet (40 mg total) by mouth daily. 90 tablet 3  . Magnesium Oxide 200 MG TABS Take 1 tablet (200 mg total) by mouth daily.  0  . metoprolol succinate (TOPROL-XL) 100 MG 24 hr tablet TAKE 1 TABLET (100 MG TOTAL) BY MOUTH DAILY WITH OR IMMEDIATELY FOLLOWING A MEAL (Patient taking differently: Take 100 mg by mouth daily.) 90 tablet 3  . Multiple Vitamins-Minerals (MULTIVITAMINS THER. W/MINERALS) TABS Take 1 tablet by mouth daily.    . nitroGLYCERIN (NITROSTAT) 0.4 MG SL tablet Place 1 tablet (0.4 mg total) under the tongue every 5 (five) minutes as needed for chest pain. 25 tablet 6  . pantoprazole (PROTONIX) 40 MG  tablet Take 1 tablet (40 mg total) by mouth 2 (two) times daily.    Marland Kitchen PROAIR HFA 108 (90 BASE) MCG/ACT inhaler Inhale 2 puffs into the lungs every 4 (four) hours as needed for shortness of breath.     . ramipril (ALTACE) 2.5 MG capsule Take 1 capsule (2.5 mg total) by mouth daily.    Marland Kitchen spironolactone (ALDACTONE) 25 MG tablet TAKE 1/2 TABLET EVERY DAY (NEED MD APPOINTMENT) (Patient taking differently: Take 12.5 mg by mouth daily.) 90 tablet 3  . tamsulosin (FLOMAX) 0.4 MG CAPS capsule Take 1 capsule (0.4 mg total) by mouth daily. 30 capsule 0  . vitamin B-12 (CYANOCOBALAMIN) 1000 MCG tablet Take 1,000 mcg by mouth daily.       Assessment: 87 y.o. male with Afib for heparin  Goal of Therapy:  Heparin level 0.3-0.7 units/ml Monitor platelets by anticoagulation protocol: Yes   Plan:  Heparin 2000  units IV bolus, then start heparin 800 units/hr Check heparin level in 8 hours.   Caryl Pina 01/11/2021,1:43 AM

## 2021-01-11 NOTE — Consult Note (Addendum)
NAME:  [name] [name] MRN:  [id number] DOB:  Aug 09, 1932 LOS: 0 ADMISSION DATE:  01/10/2021  CONSULTATION DATE:  01/11/2021 REFERRING MD:  Dr. Lennice Sites  REASON FOR CONSULTATION:  hypotension   Initial Pulmonary/Critical Care Consultation  Brief History   N/A  History of present illness   This 87 y.o. Caucasian male transferred to [facility] emergency department from a local rehab facility with complaints of generalized weakness and hypotension.  The patient is awake, alert and oriented to time, person and place.  He does often get confused during conversation.  The patient's wife is sitting at the bedside and is able to provide additional clinical history.  In short, the patient's wife is wondering why we are not giving him more fluids.  She says obviously, he is dehydrated.  He has a known history of congestive heart failure and typically has a baseline level of lower extremity edema, which is typically asymmetric.  At present, he has "skinny ankles and no swelling".  The patient has been reporting orthostasis, dizziness, thirst.  PCCM was asked to consider admitting the patient for hypotension and initiation of vasopressor.  However, after administration of 1500 cc LR, the patient has maintained MAP > 70.  EKG demonstrates atrial fibrillation, rate controlled.  Lactate was normal.  Hemoccult negative.  Hemoglobin 8.  No significant abnormalities in troponin or BNP.  He has had a prolonged series of hospitalizations and has not been home in "57 days".  He was originally diagnosed with COVID-19 pneumonia around Thanksgiving, which led to a hospitalization for treatment.  He has a known history of ischemic cardiomyopathy (LVEF 25-30%, 09/2020).  He also has a history of colon cancer in remission but also status post 2 colon resection surgeries, causing him to chronically have diarrhea.  He also has a permanent pacemaker, which based on review of the record may in fact be an  AICD/pacemaker.   REVIEW OF SYSTEMS  Constitutional: Generalized weakness, fatigue.  Orthostasis.  Dizziness.  No weight loss. No night sweats. No fever. No chills. No fatigue. HEENT: No headaches, dysphagia, sore throat, otalgia, nasal congestion, PND CV:  No chest pain, orthopnea, PND, swelling in lower extremities, palpitations GI:  No abdominal pain, nausea, vomiting, diarrhea, change in bowel pattern, anorexia Resp: No DOE, rest dyspnea, cough, mucus, hemoptysis, wheezing  GU: no dysuria, change in color of urine, no urgency or frequency.  No flank pain. MS:  No joint pain or swelling. No myalgias,  No decreased range of motion.  Psych:  No change in mood or affect. No memory loss. Skin: no rash or lesions.   Past Medical/Surgical/Social/Family History   Past Medical History:  Diagnosis Date  . CAD (coronary artery disease)    Old inferolateral MI s/p CABG in 2002  . CAP (community acquired pneumonia) 10/03/2011  . Cardiomyopathy    s/p ICD 09/30/08; EF 35 to 40% per echo in October 2021  . Chronic systolic CHF (congestive heart failure) ([place]) 07/24/2013  . Colon cancer ([place])    s/p chemo, RT, sugery  . Colorectal anastomotic stricture   . Diverticulosis   . ED (erectile dysfunction)   . GERD (gastroesophageal reflux disease)    rarely  . HTN (hypertension)   . ICD (implantable cardiac defibrillator) discharge October 2012   removed 2016    now only PPM  . Iron deficiency anemia   . Paroxysmal atrial fibrillation (HCC)    controlled with amiodarone, not a coumadin candidate due to  GI bleeding  . V tach ([place]) 12/14/2020    Past Surgical History:  Procedure Laterality Date  . APPENDECTOMY    . BACK SURGERY    . BIOPSY  09/22/2018   Procedure: BIOPSY;  Surgeon: Gatha Mayer, MD;  Location: [facility];  Service: Endoscopy;;  . CARDIAC CATHETERIZATION  11/07/2000   EF 32%  . CARDIAC DEFIBRILLATOR PLACEMENT  09/30/08   MDT dual chamber ICD placed by Dr Rayann Heman  .  CARDIOVASCULAR STRESS TEST  08/06/2008   EF 30%  . CHOLECYSTECTOMY    . COLON RESECTION     x2  . COLONOSCOPY     multiple  . COLONOSCOPY N/A 09/22/2018   Procedure: COLONOSCOPY;  Surgeon: Gatha Mayer, MD;  Location: [facility];  Service: Endoscopy;  Laterality: N/A;  . CORONARY ARTERY BYPASS GRAFT    . EP IMPLANTABLE DEVICE N/A 10/15/2015   Procedure:  PPM Generator Changeout;  Surgeon: [name], MD;  Location: [facility];  Service: Cardiovascular;  Laterality: N/A;  . HERNIA REPAIR    . US ECHOCARDIOGRAPHY  08/11/2008   EF 30-35%  . VASECTOMY      Social History   Tobacco Use  . Smoking status: Former Smoker    Types: Cigars  . Smokeless tobacco: Never Used  Substance Use Topics  . Alcohol use: No    Family History  Problem Relation Age of Onset  . Colon cancer Father   . Diabetes Sister   . Colon polyps Brother   . Pancreatic cancer Brother 19     Significant Hospital Events   N/A   Consults:  Cardiology   Procedures:  N/A   Significant Diagnostic Tests:  2/13: Chest CTA   Micro Data:   Results for orders placed or performed during the hospital encounter of 01/10/21  Resp Panel by RT-PCR (Flu A&B, Covid) Nasopharyngeal Swab     Status: Abnormal   Collection Time: 01/10/21 10:00 PM   Specimen: Nasopharyngeal Swab; Nasopharyngeal(NP) swabs in vial transport medium  Result Value Ref Range Status   SARS Coronavirus 2 by RT PCR POSITIVE (A) NEGATIVE Final    Comment: RESULT CALLED TO, READ BACK BY AND VERIFIED WITH: K. SETRAM,RN 0006 01/11/2021 T. TYSOR (NOTE) SARS-CoV-2 target nucleic acids are DETECTED.  The SARS-CoV-2 RNA is generally detectable in upper respiratory specimens during the acute phase of infection. Positive results are indicative of the presence of the identified virus, but do not rule out bacterial infection or co-infection with other pathogens not detected by the test. Clinical correlation with patient history  and other diagnostic information is necessary to determine patient infection status. The expected result is Negative.  Fact Sheet for Patients: EntrepreneurPulse.com.au  Fact Sheet for Healthcare Providers: IncredibleEmployment.be  This test is not yet approved or cleared by the [place] FDA and  has been authorized for detection and/or diagnosis of SARS-CoV-2 by FDA under an Emergency Use Authorization (EUA).  This EUA will remain in effect (meaning this test ca n be used) for the duration of  the COVID-19 declaration under Section 564(b)(1) of the Act, 21 U.S.C. section 360bbb-3(b)(1), unless the authorization is terminated or revoked sooner.     Influenza A by PCR NEGATIVE NEGATIVE Final   Influenza B by PCR NEGATIVE NEGATIVE Final    Comment: (NOTE) The Xpert Xpress SARS-CoV-2/FLU/RSV plus assay is intended as an aid in the diagnosis of influenza from Nasopharyngeal swab specimens and should not be used as a sole basis for treatment.  Nasal washings and aspirates are unacceptable for Xpert Xpress SARS-CoV-2/FLU/RSV testing.  Fact Sheet for Patients: EntrepreneurPulse.com.au  Fact Sheet for Healthcare Providers: IncredibleEmployment.be  This test is not yet approved or cleared by the [place] FDA and has been authorized for detection and/or diagnosis of SARS-CoV-2 by FDA under an Emergency Use Authorization (EUA). This EUA will remain in effect (meaning this test can be used) for the duration of the COVID-19 declaration under Section 564(b)(1) of the Act, 21 U.S.C. section 360bbb-3(b)(1), unless the authorization is terminated or revoked.  Performed at [facility], [place] [address], [place], [place] [address]       Antimicrobials:  N/A   Interim history/subjective:  N/A   Objective   BP (!) 86/65   Pulse 80   Temp (!) 97.4 F (36.3 C) (Rectal)   Resp (!) 23   SpO2 99%      There were no vitals filed for this visit. No intake or output data in the 24 hours ending 01/11/21 0056      Examination: GENERAL: alert, oriented to time, person and place, pleasant or well-developed. No acute distress. HEAD: normocephalic, atraumatic EYE: PERRLA, EOM intact, no scleral icterus, no pallor. THROAT/ORAL CAVITY: Normal dentition. No oral thrush. No exudate.  Dry membranes are moist. No tonsillar enlargement. NECK: supple, no thyromegaly, no JVD, no lymphadenopathy. Trachea midline. CHEST/LUNG: symmetric in development and expansion. Good air entry. No crackles. No wheezes. HEART: Irregularly irregular rate and rhythm.. ABDOMEN: soft, nontender, nondistended. Normoactive bowel sounds. No rebound. No guarding. No hepatosplenomegaly. EXTREMITIES: Edema: none. No cyanosis.  No clubbing. 2+ DP pulses LYMPHATIC: no cervical/axillary/inguinal lymph nodes appreciated MUSCULOSKELETAL: No point tenderness.  No bulk atrophy. Joints: Normal inspection.  SKIN: No rash or lesion.  NEUROLOGIC: Doll's eyes intact. Corneal reflex intact. Spontaneous respirations intact. Cranial nerves II-XII are grossly symmetric and physiologic. Babinski absent. No sensory deficit. Motor: 5/5 @ RUE, 5/5 @ LUE, 5/5 @ RLL,  5/5 @ LLL.  DTR: 2+ @ R biceps, 2+ @ L biceps, 2+ @ R patellar,  2+ @ L patellar. No cerebellar signs. Gait was not assessed.   Resolved Hospital Problem list   N/A   Assessment & Plan:   ASSESSMENT/PLAN:  ASSESSMENT (included in the Hospital Problem List)  Principal Problem:   Hypotension due to hypovolemia Active Problems:   Acute on chronic systolic (congestive) heart failure (HCC)   Dehydration   COVID-19 virus infection   Hypothyroidism   A-fib (HCC)   Ischemic cardiomyopathy   Hypotension   By systems: PULMONARY  COVID-19 pneumonia Start remdesivir. Start Decadron. Start vitamin C, vitamin D3, zinc.   CARDIOVASCULAR  Hypotension, improved with  hydration  Ischemic cardiomyopathy (LVEF 25-30% in 09/2020)  Atrial fibrillation Start heparin infusion.   The patient was previously anticoagulated for atrial fibrillation; however, this was apparently stopped due to question of bleeding.  Of note, Hemoccult negative today. Continue IV/PO hydration. Cardiology consultation is advised.   RENAL  Acute on chronic (stage 3) kidney disease Monitor urine output. Avoid nephrotoxic drugs. Renal dosing of medications.   GASTROINTESTINAL  Colon cancer s/p colon resection  Diarrhea, chronic, secondary to colon resection  GI PROPHYLAXIS: Protonix   HEMATOLOGIC: No acute issues  DVT PROPHYLAXIS: Needs anticoagulation for atrial fibrillation.   INFECTIOUS  SARS-CoV-2 infection   ENDOCRINE  Hypothyroidism Check TSH, free T4.   NEUROLOGIC: No acute issues   PLAN/RECOMMENDATIONS   This patient does not require ICU level care at this time.  Clinically,  he is dehydrated and malnourished.  He does have ischemic cardiomyopathy but this does not change the fact that he needs hydration.    My assessment, plan of care, findings, medications, side effects, etc. were discussed with: nurse and Dr. Christy Gentles (Emergency medicine).   Best practice:  Diet: cardiac Pain/Anxiety/Delirium protocol (if indicated): NO VAP protocol (if indicated): NO DVT prophylaxis: heparin GI prophylaxis: Protonix Glucose control: N/A Mobility/Activity: OOB w/assistance.  Needs PT eval. CODE STATUS:   Code Status: Prior Family Communication:  wife at bedside, updated Disposition: this patient does not require ICU level care at this time.  Will see as needed. Please call if needed.   Labs   CBC: Recent Labs  Lab 01/10/21 2152 01/10/21 2201  WBC 6.9  --   NEUTROABS 5.7  --   HGB 8.0* 8.8*  HCT 26.8* 26.0*  MCV 90.8  --   PLT 231  --     Basic Metabolic Panel: Recent Labs  Lab 01/10/21 2152 01/10/21 2201  NA 135 138  K 4.1 4.2  CL 103   --   CO2 24  --   GLUCOSE 102*  --   BUN 37*  --   CREATININE 1.79*  --   CALCIUM 8.3*  --   MG 2.0  --    GFR: CrCl cannot be calculated (Unknown ideal weight.). Recent Labs  Lab 01/10/21 2152  WBC 6.9  LATICACIDVEN 0.9    Liver Function Tests: Recent Labs  Lab 01/10/21 2152  AST 18  ALT 13  ALKPHOS 37*  BILITOT 0.8  PROT 5.6*  ALBUMIN 2.9*   Recent Labs  Lab 01/10/21 2152  LIPASE 22   Recent Labs  Lab 01/10/21 2152  AMMONIA 15    ABG    Component Value Date/Time   HCO3 24.7 01/10/2021 2201   TCO2 26 01/10/2021 2201   ACIDBASEDEF 1.0 06/11/2007 1615   O2SAT 88.0 01/10/2021 2201     Coagulation Profile: No results for input(s): INR, PROTIME in the last 168 hours.  Cardiac Enzymes: No results for input(s): CKTOTAL, CKMB, CKMBINDEX, TROPONINI in the last 168 hours.  HbA1C: Hgb A1c MFr Bld  Date/Time Value Ref Range Status  06/11/2007 04:22 PM   Final   5.7 (NOTE)   The ADA recommends the following therapeutic goals for glycemic   control related to Hgb A1C measurement:   Goal of Therapy:   < 7.0% Hgb A1C   Action Suggested:  > 8.0% Hgb A1C   Ref:  Diabetes Care, 22, Suppl. 1, 1999    CBG: No results for input(s): GLUCAP in the last 168 hours.   Review of Systems:   See above   Past Medical History   Past Medical History:  Diagnosis Date  . CAD (coronary artery disease)    Old inferolateral MI s/p CABG in 2002  . CAP (community acquired pneumonia) 10/03/2011  . Cardiomyopathy    s/p ICD 09/30/08; EF 35 to 40% per echo in October 2021  . Chronic systolic CHF (congestive heart failure) ([place]) 07/24/2013  . Colon cancer ([place])    s/p chemo, RT, sugery  . Colorectal anastomotic stricture   . Diverticulosis   . ED (erectile dysfunction)   . GERD (gastroesophageal reflux disease)    rarely  . HTN (hypertension)   . ICD (implantable cardiac defibrillator) discharge October 2012   removed 2016    now only PPM  . Iron deficiency anemia   .  Paroxysmal atrial fibrillation (HCC)  controlled with amiodarone, not a coumadin candidate due to GI bleeding  . V tach ([place]) 12/14/2020      Surgical History    Past Surgical History:  Procedure Laterality Date  . APPENDECTOMY    . BACK SURGERY    . BIOPSY  09/22/2018   Procedure: BIOPSY;  Surgeon: Gatha Mayer, MD;  [facility];  Service: Endoscopy;;  . CARDIAC CATHETERIZATION  11/07/2000   EF 32%  . CARDIAC DEFIBRILLATOR PLACEMENT  09/30/08   MDT dual chamber ICD placed by Dr Rayann Heman  . CARDIOVASCULAR STRESS TEST  08/06/2008   EF 30%  . CHOLECYSTECTOMY    . COLON RESECTION     x2  . COLONOSCOPY     multiple  . COLONOSCOPY N/A 09/22/2018   Procedure: COLONOSCOPY;  Surgeon: Gatha Mayer, MD;  [facility];  Service: Endoscopy;  Laterality: N/A;  . CORONARY ARTERY BYPASS GRAFT    . EP IMPLANTABLE DEVICE N/A 10/15/2015   Procedure:  PPM Generator Changeout;  Surgeon: [name], MD;  Location: [facility] [place] [facility];  Service: Cardiovascular;  Laterality: N/A;  . HERNIA REPAIR    . US ECHOCARDIOGRAPHY  08/11/2008   EF 30-35%  . VASECTOMY        Social History   Social History   Socioeconomic History  . Marital status: Married    Spouse name: Not on file  . Number of children: 2  . Years of education: Not on file  . Highest education level: Not on file  Occupational History  . Occupation: Retired     Fish farm manager: RETIRED  Tobacco Use  . Smoking status: Former Smoker    Types: Cigars  . Smokeless tobacco: Never Used  Vaping Use  . Vaping Use: Never used  Substance and Sexual Activity  . Alcohol use: No  . Drug use: No  . Sexual activity: Not Currently  Other Topics Concern  . Not on file  Social History Narrative   Lives in [place] with spouse.  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 Colon cancer Father   . Diabetes Sister   . Colon polyps Brother   . Pancreatic cancer Brother 60   family history includes Colon cancer in his father; Colon polyps in his brother; Diabetes in his sister; Pancreatic cancer (age of onset: 72) in his brother.    Allergies Allergies  Allergen Reactions  . Iron     Stomach issues  . Lescol [Fluvastatin Sodium]     Dizziness  . Pentazocine Lactate     unknown  . Vytorin [Ezetimibe-Simvastatin]     Leg cramps   . Aspirin     Bleeding       Current Medications  Current Facility-Administered Medications:  .  lactated ringers bolus 250 mL, 250 mL, Intravenous, Once, Christy Gentles, MD  Current Outpatient Medications:  .  amiodarone (PACERONE) 200 MG tablet, Take 2 tablets (400 mg total) by mouth 2 (two) times daily. START with 400mg  PO  BID for 10 days THEN reduce to 400mg  PO daily for 10 days THEN reduce to 200mg  PO daily, Disp: , Rfl:  .  BESIVANCE 0.6 % SUSP, Place 1 drop into the right eye 3 (three) times daily., Disp: , Rfl: 1 .  cholecalciferol (VITAMIN D) 1000 UNITS tablet, Take 1,000 Units by mouth daily., Disp: , Rfl:  .  diphenoxylate-atropine (LOMOTIL) 2.5-0.025 MG tablet, Take 1 tablet by mouth 4 (four) times daily as needed for diarrhea or loose stools. , Disp: , Rfl:  .  DUREZOL 0.05 % EMUL, Place 1 drop into the right eye 3 (three) times daily., Disp: , Rfl: 1 .  furosemide (LASIX) 40 MG tablet, Take 1 tablet (40 mg total) by mouth daily., Disp: 90 tablet, Rfl: 3 .  Magnesium Oxide 200 MG TABS, Take 1 tablet (200 mg total) by mouth daily., Disp: , Rfl: 0 .  metoprolol succinate (TOPROL-XL) 100 MG 24 hr tablet, TAKE 1 TABLET (100 MG TOTAL) BY MOUTH DAILY WITH OR IMMEDIATELY FOLLOWING A MEAL  (Patient taking differently: Take 100 mg by mouth daily.), Disp: 90 tablet, Rfl: 3 .  Multiple Vitamins-Minerals (MULTIVITAMINS THER. W/MINERALS) TABS, Take 1 tablet by mouth daily., Disp: , Rfl:  .  nitroGLYCERIN (NITROSTAT) 0.4 MG SL tablet, Place 1 tablet (0.4 mg total) under the tongue every 5 (five) minutes as needed for chest pain., Disp: 25 tablet, Rfl: 6 .  pantoprazole (PROTONIX) 40 MG tablet, Take 1 tablet (40 mg total) by mouth 2 (two) times daily., Disp: , Rfl:  .  PROAIR HFA 108 (90 BASE) MCG/ACT inhaler, Inhale 2 puffs into the lungs every 4 (four) hours as needed for shortness of breath. , Disp: , Rfl:  .  ramipril (ALTACE) 2.5 MG capsule, Take 1 capsule (2.5 mg total) by mouth daily., Disp: , Rfl:  .  spironolactone (ALDACTONE) 25 MG tablet, TAKE 1/2 TABLET EVERY DAY (NEED MD APPOINTMENT) (Patient taking differently: Take 12.5 mg by mouth daily.), Disp: 90 tablet, Rfl: 3 .  tamsulosin (FLOMAX) 0.4 MG CAPS capsule, Take 1 capsule (0.4 mg total) by mouth daily., Disp: 30 capsule, Rfl: 0 .  vitamin B-12 (CYANOCOBALAMIN) 1000 MCG tablet, Take 1,000 mcg by mouth daily., Disp: , Rfl:   Home Medications  Prior to Admission medications   Medication Sig Start Date End Date Taking? Authorizing Provider  amiodarone (PACERONE) 200 MG tablet Take 2 tablets (400 mg total) by mouth 2 (two) times daily. START with 400mg  PO BID for 10 days THEN reduce to 400mg  PO daily for 10 days THEN reduce to 200mg  PO daily 12/17/20   Baldwin Jamaica, PA-C  BESIVANCE 0.6 % SUSP Place 1 drop into the right eye 3 (three) times daily. 08/23/18   [provider]  cholecalciferol (VITAMIN D) 1000 UNITS tablet Take 1,000 Units by mouth daily.    [provider]  diphenoxylate-atropine (LOMOTIL) 2.5-0.025 MG tablet Take 1 tablet by mouth 4 (four) times daily as needed for diarrhea or loose stools.  12/27/19   [provider]  DUREZOL 0.05 % EMUL Place 1 drop into the right eye 3 (three) times  daily. 08/23/18   [provider]  furosemide (LASIX) 40 MG tablet Take 1 tablet (40 mg total) by mouth daily. 12/10/20   [name], Peter M, MD  Magnesium Oxide 200 MG TABS Take 1 tablet (200 mg total) by mouth daily. 12/17/20   Baldwin Jamaica, PA-C  metoprolol succinate (TOPROL-XL) 100 MG 24 hr tablet TAKE 1 TABLET (100  MG TOTAL) BY MOUTH DAILY WITH OR IMMEDIATELY FOLLOWING A MEAL Patient taking differently: Take 100 mg by mouth daily. 01/08/20   Almyra Deforest, PA  Multiple Vitamins-Minerals (MULTIVITAMINS THER. W/MINERALS) TABS Take 1 tablet by mouth daily.    [provider]  nitroGLYCERIN (NITROSTAT) 0.4 MG SL tablet Place 1 tablet (0.4 mg total) under the tongue every 5 (five) minutes as needed for chest pain. 05/07/14   [name], Peter M, MD  pantoprazole (PROTONIX) 40 MG tablet Take 1 tablet (40 mg total) by mouth 2 (two) times daily. 11/02/20   Thurnell Lose, MD  PROAIR HFA 108 (90 BASE) MCG/ACT inhaler Inhale 2 puffs into the lungs every 4 (four) hours as needed for shortness of breath.  10/22/13   [provider]  ramipril (ALTACE) 2.5 MG capsule Take 1 capsule (2.5 mg total) by mouth daily. 11/02/20   Thurnell Lose, MD  spironolactone (ALDACTONE) 25 MG tablet TAKE 1/2 TABLET EVERY DAY (NEED MD APPOINTMENT) Patient taking differently: Take 12.5 mg by mouth daily. 10/12/20   Allred, Jeneen Rinks, MD  tamsulosin (FLOMAX) 0.4 MG CAPS capsule Take 1 capsule (0.4 mg total) by mouth daily. 11/02/20   Thurnell Lose, MD  vitamin B-12 (CYANOCOBALAMIN) 1000 MCG tablet Take 1,000 mcg by mouth daily.    [provider]      Critical care time: 60 minutes.  The treatment and management of the patient's condition was required based on the threat of imminent deterioration. This time reflects time spent by the physician evaluating, providing care and managing the critically ill patient's care. The time was spent at the immediate bedside (or on the same floor/unit and dedicated  to this patient's care). Time involved in separately billable procedures is NOT included int he critical care time indicated above. Family meeting and update time may be included above if and only if the patient is unable/incompetent to participate in clinical interview and/or decision making, and the discussion was necessary to determining treatment decisions.    Renee Pain, MD Board Certified by the ABIM, [place]

## 2021-01-11 NOTE — NC FL2 (Signed)
[place] MEDICAID FL2 LEVEL OF CARE SCREENING TOOL     IDENTIFICATION  Patient Name: [name] [name] Birthdate: November 14, 1932 Sex: male Admission Date (Current Location): 01/10/2021  [facility] and [place] Number:  Herbalist and Address:  [facility] [place]. [facility], [place] [address], [place], [place] [address]      Provider Number: [id number]  Attending Physician Name and Address:  Thurnell Lose, MD  Relative Name and Phone Number:  [place], [place] [phone]    Current Level of Care: Hospital Recommended Level of Care: [place] Prior Approval Number:    Date Approved/Denied:   PASRR Number: [id number]  Discharge Plan: SNF    Current Diagnoses: Patient Active Problem List   Diagnosis Date Noted  . Hypothyroidism 01/11/2021  . Dehydration 01/11/2021  . Hypotension due to hypovolemia 01/11/2021  . Hypotension 01/11/2021  . Acute on chronic HFrEF (heart failure with reduced ejection fraction) ([place]) 12/13/2020  . Pressure injury of skin 11/04/2020  . Macrocytic anemia 10/23/2020  . Hypoalbuminemia 10/23/2020  . COVID-19 virus infection   . Sepsis ([place])   . Wide-complex tachycardia ([place])   . GI bleed 09/23/2018  . Ulceration of intestine   . Lower GI bleed 07/01/2018  . ICD in place- MDT 2009 07/03/2014  . History of GI bleed-  07/03/2014  . Essential hypertension 07/03/2014  . Acute on chronic systolic (congestive) heart failure ([place]) 10/15/2013  . Dizziness 10/14/2013  . Ischemic cardiomyopathy 10/14/2011  . Automatic implantable cardioverter/defibrillator (AICD) activation 10/02/2011  . A-fib ([place]) 03/21/2011  . CAD (coronary artery disease) 03/21/2011    Orientation RESPIRATION BLADDER Height & Weight     Self,Place  Normal Incontinent,External catheter Weight: 126 lb 15.8 oz (57.6 kg) Height:  5\' 10"  (177.8 cm)  BEHAVIORAL SYMPTOMS/MOOD NEUROLOGICAL BOWEL NUTRITION STATUS      Continent Diet (see d/c summary)   AMBULATORY STATUS COMMUNICATION OF NEEDS Skin   Extensive Assist Verbally Normal                       Personal Care Assistance Level of Assistance  Bathing,Feeding,Dressing Bathing Assistance: Maximum assistance Feeding assistance: Maximum assistance Dressing Assistance: Maximum assistance     Functional Limitations Info  Sight,Hearing,Speech Sight Info: Impaired Hearing Info: Impaired Speech Info: Adequate    SPECIAL CARE FACTORS FREQUENCY  PT (By licensed PT),OT (By licensed OT)     PT Frequency: 5X per week OT Frequency: 5X per week            Contractures      Additional Factors Info  Code Status,Allergies,Isolation Precautions Code Status Info: DNR Allergies Info: Iron, Lescol (Fluvastatin Sodium), Pentazocine Lactate, Vytorin (Ezetimibe-simvastatin), Aspirin           Current Medications (01/11/2021):  This is the current hospital active medication list Current Facility-Administered Medications  Medication Dose Route Frequency Provider Last Rate Last Admin  . amiodarone (PACERONE) tablet 200 mg  200 mg Oral Daily Thurnell Lose, MD      . ascorbic acid (VITAMIN C) tablet 1,000 mg  1,000 mg Oral TID Rise Patience, MD   1,000 mg at 01/11/21 0741  . dexamethasone (DECADRON) injection 6 mg  6 mg Intravenous Q24H Rise Patience, MD   6 mg at 01/11/21 0336  . Difluprednate 0.05 % EMUL 1 drop  1 drop Right Eye TID Thurnell Lose, MD      . gatifloxacin (ZYMAXID) 0.5 % ophthalmic drops 1 drop  1 drop Both Eyes QID Lala Lund K, MD      . heparin ADULT infusion 100 units/mL (25000 units/29mL)  800 Units/hr Intravenous Continuous Rise Patience, MD 8 mL/hr at 01/11/21 0844 800 Units/hr at 01/11/21 0844  . lactated ringers infusion   Intravenous Continuous Thurnell Lose, MD 125 mL/hr at 01/11/21 0753 New Bag at 01/11/21 0753  . nitroGLYCERIN (NITROSTAT) SL tablet 0.4 mg  0.4 mg Sublingual Q5 min PRN Thurnell Lose, MD      .  ondansetron [facility]) injection 4 mg  4 mg Intravenous Q6H PRN Thurnell Lose, MD   4 mg at 01/11/21 0955  . pantoprazole (PROTONIX) injection 40 mg  40 mg Intravenous QHS Rise Patience, MD   40 mg at 01/11/21 0336  . [START ON 01/12/2021] remdesivir 100 mg in sodium chloride 0.9 % 100 mL IVPB  100 mg Intravenous Daily Rise Patience, MD      . zinc sulfate capsule 220 mg  220 mg Oral Daily Rise Patience, MD   220 mg at 01/11/21 8675     Discharge Medications: Please see discharge summary for a list of discharge medications.  Relevant Imaging Results:  Relevant Lab Results:   Additional Information SS#: [id number]  Tanga [place], LCSWA

## 2021-01-11 NOTE — Progress Notes (Addendum)
   01/11/21 1831  Restraint Order  Length of Order Daily  Assessment  Less Restrictive Interventions Attempted Yes  Health history reviewed prior to applying restraint Yes  Justification  Clinical Justification Pulling lines;Pulling tubes;Removal of equipment  Plan to Progress Out Reality orientation;Family participation;Interdisciplinary collaboration;Continue safety plan  Education  Discontinuation Criteria Alternative intervention effective;Responding to safe limit settings;Follows instructions;No longer interfering with care  Discontinuation Criteria Explained Yes  Family Notification Patient declined notification  Restraint Every 2 Hour Monitoring  Airway Clear with Spontaneous Respirations Yes  Circulation / Skin Integrity No signs of injury  Emotional / Mental Status Agitated/restless;Confused;Delusional  Range of Motion Performed  Food and Fluids Fluid/Food offered  Elimination Toileted  Patient's rights, dignity, safety maintained Yes  Can Restraints be Less Restrictive or Discontinued? No  Non-violent Restraints  Soft Restraint Right Wrist Start  Soft Restraint Left Wrist Start    Throughout the afternoon, patient becoming increasingly more confused and constantly pulling at lines, leads and other equipment. Patient unable to be redirected with mitts. Staff constantly in and out of room trying to assist patient. Patient is now being combative with staff. Dr. Candiss Norse notified. Bladder scan performed which showed 582. In and out performed which yielded 575 of clear, yellow urine. PRN Haldol administered to patient as well. Will continue to monitor.

## 2021-01-11 NOTE — ED Provider Notes (Addendum)
Medical screening examination/treatment/procedure(s) were performed by non-physician practitioner and as supervising physician I was immediately available for consultation/collaboration.  Patient with history of colon cancer in remission, cardiomyopathy with EF of 20 to 25%, ICD for paroxysmal A. fib but not on anticoagulation who presents to the ED with generalized weakness, hypotension.  Patient hypotensive upon arrival.  However otherwise vitals are unremarkable.  EKG shows A. fib rate controlled.  Patient has had some shortness of breath but no chest pain, no abdominal pain.  He has had some loose stools over the last several days.  Denies any recent antibiotic use.  Denies any abdominal pain, headaches.  Has not gotten up much today but he noticed when he turns his head fast to get slightly dizzy.  Neurologically he is intact.  Broad work-up was initiated for hypotension including infectious work-up.  Hemoccult was negative and patient has brown stool on exam.  Hemoglobin is 8.  This is fairly around his baseline.  He has no leukocytosis, no lactic acidosis.  Chest x-ray showed no signs of infection.  Has no cough or sputum production.  Doubt pneumonia.  Urinalysis is pending but he denies any urinary symptoms.  Blood gas is overall unremarkable.  Troponin 20 and BNP 300 which is around baseline.  Otherwise electrolytes are unremarkable.  Ammonia unremarkable.  Patient has been given a total of 1.5 L of fluid and patient still hypotensive in the 80s.  Bedside ultrasound was performed by my resident that showed decreased EF and full IVC.  At this point CT scan of chest to rule out PE was done that confirmed no PE.  He did have some small pleural effusions.  CT scan of the abdomen and pelvis showed mild enteritis.  But otherwise unremarkable CT.  Could all be secondary to some GI losses but has not had as robust response to IV fluids as I would expect.  Could be cardiogenic given his poor EF or may be  medication side effect.  However has reassuring lactic and cardiac enzymes.  Talk to cardiology about patient on the phone and have decided to talk with ICU to come evaluate the patient to possibly administer further work-up.  Will hold vasopressors at this time and defer to ICU.  We will see if he has response to fluids as small fluid bolus is finishing up.  At this time ICU will evaluate the patient and give further recommendations.  Have a low concern for septic process.  May continue to respond with gentle fluids.  Patient handed off to Dr. Christy Gentles who is aware of the plan.  This chart was dictated using voice recognition software.  Despite best efforts to proofread,  errors can occur which can change the documentation meaning.      EKG Interpretation  Date/Time:  Sunday January 10 2021 19:57:58 EST Ventricular Rate:  79 PR Interval:    QRS Duration: 173 QT Interval:  495 QTC Calculation: 568 R Axis:   -93 Text Interpretation: Atrial fibrillation Nonspecific IVCD with LAD Consider left ventricular hypertrophy Confirmed by Lennice Sites [phone]) on 01/10/2021 8:00:21 PM       .Critical Care Performed by: Lennice Sites, DO Authorized by: Lennice Sites, DO   Critical care provider statement:    Critical care time (minutes):  45   Critical care was necessary to treat or prevent imminent or life-threatening deterioration of the following conditions:  Circulatory failure   Critical care was time spent personally by me on the following activities:  Blood  draw for specimens, development of treatment plan with patient or surrogate, discussions with consultants, discussions with primary provider, evaluation of patient's response to treatment, examination of patient, obtaining history from patient or surrogate, ordering and performing treatments and interventions, ordering and review of laboratory studies, ordering and review of radiographic studies, pulse oximetry, re-evaluation of patient's  condition and review of old charts   I assumed direction of critical care for this patient from another provider in my specialty: no        Lennice Sites, DO 01/11/21 0008    Lennice Sites, DO 01/11/21 0011

## 2021-01-11 NOTE — Evaluation (Signed)
Physical Therapy Evaluation Patient Details Name: [name] [name] MRN: [id number] DOB: 1932/07/04 Today's Date: 01/11/2021   History of Present Illness  Pt is an 87 y.o. male admitted from [facility] for hypotension. PMH consists of proximal atrial fibrillation s/p pacemaker placement, cardiomyopathy s/p ICD placement last EF was around 35 to 40%, chronic kidney disease stage III, chronic anemia, h/o colon cancer, admitted November 2021 for Covid infection and admitted January 2022 for VT. On admission, pt tested COVID+. He did test negative during Jan 2022 admission.    Clinical Impression  Pt admitted with above diagnosis. PTA pt was at [facility] for rehab. On eval, he required min assist bed mobility, and mod assist sit to stand. Prior to mobility, pt will c/o nausea. Upon sitting EOB, pt began dry heaving/vomitting. Pt vomited small amount of yellow-tinted liquid. Pt was able to stand x 1 trial for better positioning in bed prior to return to supine. Pt currently with functional limitations due to the deficits listed below. Pt will benefit from skilled PT to increase their independence and safety with mobility to allow discharge to the venue listed below.  Pt lives alone. If family able to provide needed level of assist and 24-hour care, HHPT may be considered. Suspect pt will progress well with mobility when N&V subsides.     Follow Up Recommendations SNF;Supervision/Assistance - 24 hour    Equipment Recommendations  None recommended by PT    Recommendations for Other Services       Precautions / Restrictions Precautions Precautions: Fall Restrictions Weight Bearing Restrictions: No      Mobility  Bed Mobility Overal bed mobility: Needs Assistance Bed Mobility: Supine to Sit;Sit to Supine     Supine to sit: Min assist;HOB elevated Sit to supine: Min assist;HOB elevated   General bed mobility comments: +rail, increased time    Transfers Overall transfer level:  Needs assistance Equipment used: 1 person hand held assist Transfers: Sit to/from Stand Sit to Stand: Mod assist         General transfer comment: assist to power up and stabilize balance  Ambulation/Gait             General Gait Details: unable due to N&V  Stairs            Wheelchair Mobility    Modified Rankin (Stroke Patients Only)       Balance Overall balance assessment: Needs assistance Sitting-balance support: No upper extremity supported;Feet supported Sitting balance-Leahy Scale: Fair   Postural control: Right lateral lean Standing balance support: Bilateral upper extremity supported;During functional activity Standing balance-Leahy Scale: Poor Standing balance comment: reliant on external support for static stance                             Pertinent Vitals/Pain Pain Assessment: No/denies pain    Home Living Family/patient expects to be discharged to:: Private residence Living Arrangements: Alone Available Help at Discharge: Family;Available PRN/intermittently (Pt reports family is setting up PCA to assist pt 3-4 hours/day.) Type of Home: Apartment Home Access: Level entry     Home Layout: One level Home Equipment: Cane - single point;Walker - 2 wheels;Other (comment);Walker - 4 wheels      Prior Function Level of Independence: Needs assistance   Gait / Transfers Assistance Needed: ambulates with RW vs cane  ADL's / Homemaking Assistance Needed: family provides groceries; stands at sink to bathe  Comments: Above history is prior to  Nov 2021 hospitalization. Pt has been in SNF for therapy since discharge from that admission. 51 days per pt.     Hand Dominance   Dominant Hand: Right    Extremity/Trunk Assessment   Upper Extremity Assessment Upper Extremity Assessment: Generalized weakness    Lower Extremity Assessment Lower Extremity Assessment: Generalized weakness    Cervical / Trunk Assessment Cervical / Trunk  Assessment: Kyphotic  Communication   Communication: HOH  Cognition Arousal/Alertness: Awake/alert Behavior During Therapy: WFL for tasks assessed/performed Overall Cognitive Status: No family/caregiver present to determine baseline cognitive functioning Area of Impairment: Attention;Safety/judgement;Problem solving;Awareness                   Current Attention Level: Sustained     Safety/Judgement: Decreased awareness of safety Awareness: Emergent Problem Solving: Difficulty sequencing;Requires verbal cues;Requires tactile cues General Comments: Difficult to redirect. Distracted by N&V. HOH      General Comments General comments (skin integrity, edema, etc.): BP 104/65 supine prior to mobility. BP remained stable EOB. Unable to obtain full orthostatic vitals due to N&V.    Exercises     Assessment/Plan    PT Assessment Patient needs continued PT services  PT Problem List Decreased strength;Decreased mobility;Decreased activity tolerance;Decreased balance       PT Treatment Interventions Therapeutic activities;Gait training;Therapeutic exercise;Patient/family education;Balance training;Functional mobility training    PT Goals (Current goals can be found in the Care Plan section)  Acute Rehab PT Goals Patient Stated Goal: home PT Goal Formulation: With patient Time For Goal Achievement: 01/25/21 Potential to Achieve Goals: Good    Frequency Min 3X/week   Barriers to discharge        Co-evaluation               AM-PAC PT "6 Clicks" Mobility  Outcome Measure Help needed turning from your back to your side while in a flat bed without using bedrails?: None Help needed moving from lying on your back to sitting on the side of a flat bed without using bedrails?: A Little Help needed moving to and from a bed to a chair (including a wheelchair)?: A Little Help needed standing up from a chair using your arms (e.g., wheelchair or bedside chair)?: A Little Help  needed to walk in hospital room?: A Little Help needed climbing 3-5 steps with a railing? : A Lot 6 Click Score: 18    End of Session   Activity Tolerance: Treatment limited secondary to medical complications (Comment) (N&V) Patient left: in bed;with call bell/phone within reach;with bed alarm set Nurse Communication: Mobility status PT Visit Diagnosis: Difficulty in walking, not elsewhere classified (R26.2);Muscle weakness (generalized) (M62.81)    Time: 9470-9628 PT Time Calculation (min) (ACUTE ONLY): 31 min   Charges:   PT Evaluation $PT Eval Moderate Complexity: 1 Mod PT Treatments $Therapeutic Activity: 8-22 mins        Lorrin Goodell, PT  Office # [phone] Pager [phone]   Lorriane Shire 01/11/2021, 10:43 AM

## 2021-01-11 NOTE — Progress Notes (Addendum)
ANTICOAGULATION CONSULT NOTE - Follow Up Consult  Pharmacy Consult for Heparin Indication: atrial fibrillation  Allergies  Allergen Reactions  . Iron     Stomach issues  . Lescol [Fluvastatin Sodium]     Dizziness  . Pentazocine Lactate     unknown  . Vytorin [Ezetimibe-Simvastatin]     Leg cramps   . Aspirin     Bleeding     Patient Measurements: Height: 5\' 10"  (177.8 cm) Weight: 57.6 kg (126 lb 15.8 oz) IBW/kg (Calculated) : 73 Heparin Dosing Weight: 57.6 kg  Vital Signs: Temp: 97.3 F (36.3 C) (02/14 1247) Temp Source: Oral (02/14 1247) BP: 97/64 (02/14 1432) Pulse Rate: 89 (02/14 1247)  Labs: Recent Labs    01/10/21 2152 01/10/21 2201 01/10/21 2346 01/11/21 0921  HGB 8.0* 8.8*  --   --   HCT 26.8* 26.0*  --   --   PLT 231  --   --   --   HEPARINUNFRC  --   --   --  0.33  CREATININE 1.79*  --   --  1.61*  TROPONINIHS 20*  --  17  --     Estimated Creatinine Clearance: 26.8 mL/min (A) (by C-G formula based on SCr of 1.61 mg/dL (H)).   Medications:  Scheduled:  . amiodarone  200 mg Oral Daily  . vitamin C  1,000 mg Oral TID  . dexamethasone (DECADRON) injection  6 mg Intravenous Q24H  . Difluprednate  1 drop Right Eye TID  . gatifloxacin  1 drop Both Eyes QID  . pantoprazole (PROTONIX) IV  40 mg Intravenous QHS  . zinc sulfate  220 mg Oral Daily   Infusions:  . heparin 800 Units/hr (01/11/21 0844)  . [START ON 01/12/2021] remdesivir 100 mg in NS 100 mL      Assessment: 87 yo M on heparin for afib.  Heparin level at low end of goal on 800 units/hr.  Goal of Therapy:  Heparin level 0.3-0.7 units/ml Monitor platelets by anticoagulation protocol: Yes   Plan:  Continue heparin at 800 units/hr. Recheck heparin level this PM for confirmation. Daily heparin level and CBC. Follow-up long term anticoagulation plan as patient was not on anticoagulation PTA.  [employer], Pharm.D., BCPS Clinical Pharmacist  **Pharmacist phone directory can  be found on [email or web address] listed under [place].  01/11/2021 4:32 PM    ADDENDUM: Confirmation heparin level remains therapeutic.  No changes needed.  [employer], Pharm.D., BCPS Clinical Pharmacist  01/11/2021 6:51 PM

## 2021-01-12 DIAGNOSIS — E861 Hypovolemia: Secondary | ICD-10-CM

## 2021-01-12 DIAGNOSIS — I5023 Acute on chronic systolic (congestive) heart failure: Secondary | ICD-10-CM

## 2021-01-12 DIAGNOSIS — Z515 Encounter for palliative care: Secondary | ICD-10-CM

## 2021-01-12 DIAGNOSIS — I9589 Other hypotension: Secondary | ICD-10-CM | POA: Diagnosis not present

## 2021-01-12 DIAGNOSIS — Z7189 Other specified counseling: Secondary | ICD-10-CM | POA: Diagnosis not present

## 2021-01-12 DIAGNOSIS — Z66 Do not resuscitate: Secondary | ICD-10-CM

## 2021-01-12 DIAGNOSIS — U071 COVID-19: Principal | ICD-10-CM

## 2021-01-12 DIAGNOSIS — I4819 Other persistent atrial fibrillation: Secondary | ICD-10-CM

## 2021-01-12 LAB — COMPREHENSIVE METABOLIC PANEL
ALT: 21 U/L (ref 0–44)
AST: 25 U/L (ref 15–41)
Albumin: 3 g/dL — ABNORMAL LOW (ref 3.5–5.0)
Alkaline Phosphatase: 43 U/L (ref 38–126)
Anion gap: 10 (ref 5–15)
BUN: 39 mg/dL — ABNORMAL HIGH (ref 8–23)
CO2: 21 mmol/L — ABNORMAL LOW (ref 22–32)
Calcium: 8.9 mg/dL (ref 8.9–10.3)
Chloride: 105 mmol/L (ref 98–111)
Creatinine, Ser: 1.84 mg/dL — ABNORMAL HIGH (ref 0.61–1.24)
GFR, Estimated: 35 mL/min — ABNORMAL LOW (ref >60)
Glucose, Bld: 112 mg/dL — ABNORMAL HIGH (ref 70–99)
Potassium: 4.4 mmol/L (ref 3.5–5.1)
Sodium: 136 mmol/L (ref 135–145)
Total Bilirubin: 0.6 mg/dL (ref 0.3–1.2)
Total Protein: 5.7 g/dL — ABNORMAL LOW (ref 6.5–8.1)

## 2021-01-12 LAB — HEPARIN LEVEL (UNFRACTIONATED): Heparin Unfractionated: 0.47 IU/mL (ref 0.30–0.70)

## 2021-01-12 LAB — CBC WITH DIFFERENTIAL/PLATELET
Abs Immature Granulocytes: 0.06 10*3/uL (ref 0.00–0.07)
Basophils Absolute: 0 10*3/uL (ref 0.0–0.1)
Basophils Relative: 0 %
Eosinophils Absolute: 0 10*3/uL (ref 0.0–0.5)
Eosinophils Relative: 0 %
HCT: 29.6 % — ABNORMAL LOW (ref 39.0–52.0)
Hemoglobin: 8.8 g/dL — ABNORMAL LOW (ref 13.0–17.0)
Immature Granulocytes: 1 %
Lymphocytes Relative: 6 %
Lymphs Abs: 0.4 10*3/uL — ABNORMAL LOW (ref 0.7–4.0)
MCH: 26.8 pg (ref 26.0–34.0)
MCHC: 29.7 g/dL — ABNORMAL LOW (ref 30.0–36.0)
MCV: 90.2 fL (ref 80.0–100.0)
Monocytes Absolute: 0.5 10*3/uL (ref 0.1–1.0)
Monocytes Relative: 8 %
Neutro Abs: 5.7 10*3/uL (ref 1.7–7.7)
Neutrophils Relative %: 85 %
Platelets: 249 10*3/uL (ref 150–400)
RBC: 3.28 MIL/uL — ABNORMAL LOW (ref 4.22–5.81)
RDW: 16.5 % — ABNORMAL HIGH (ref 11.5–15.5)
WBC: 6.7 10*3/uL (ref 4.0–10.5)
nRBC: 0 % (ref 0.0–0.2)

## 2021-01-12 LAB — D-DIMER, QUANTITATIVE: D-Dimer, Quant: 0.96 ug/mL-FEU — ABNORMAL HIGH (ref 0.00–0.50)

## 2021-01-12 LAB — PROCALCITONIN: Procalcitonin: <0.1 ng/mL

## 2021-01-12 LAB — C-REACTIVE PROTEIN: CRP: 0.6 mg/dL (ref <1.0)

## 2021-01-12 MED ORDER — PANTOPRAZOLE SODIUM 40 MG PO TBEC
40.0000 mg | DELAYED_RELEASE_TABLET | Freq: Every day | ORAL | Status: DC
  Administered 2021-01-12 – 2021-01-14 (×3): 40 mg via ORAL
  Filled 2021-01-12 (×3): qty 1

## 2021-01-12 MED ORDER — HEPARIN SODIUM (PORCINE) 5000 UNIT/ML IJ SOLN
5000.0000 [IU] | Freq: Three times a day (TID) | INTRAMUSCULAR | Status: DC
  Administered 2021-01-12 – 2021-01-15 (×9): 5000 [IU] via SUBCUTANEOUS
  Filled 2021-01-12 (×9): qty 1

## 2021-01-12 MED ORDER — TAMSULOSIN HCL 0.4 MG PO CAPS
0.4000 mg | ORAL_CAPSULE | Freq: Every day | ORAL | Status: DC
  Administered 2021-01-12 – 2021-01-15 (×4): 0.4 mg via ORAL
  Filled 2021-01-12 (×4): qty 1

## 2021-01-12 NOTE — Progress Notes (Signed)
PHARMACIST - PHYSICIAN COMMUNICATION  DR:   Candiss Norse   CONCERNING: IV to Oral Route Change Policy  RECOMMENDATION: This patient is receiving Protonix by the intravenous route.  Based on criteria approved by the Pharmacy and Therapeutics Committee, the intravenous medication(s) is/are being converted to the equivalent oral dose form(s).   DESCRIPTION: These criteria include:  The patient is eating (either orally or via tube) and/or has been taking other orally administered medications for a least 24 hours  The patient has no evidence of active gastrointestinal bleeding or impaired GI absorption (gastrectomy, short bowel, patient on TNA or NPO).  If you have questions about this conversion, please contact the Pharmacy Department  []   [phone] )  [facility] []   [phone] )  [facility] [x]   [phone] )  [name] []   [phone] )  [facility] []   [phone] )  [place], [place] 01/12/2021 9:16 AM

## 2021-01-12 NOTE — Progress Notes (Signed)
ANTICOAGULATION CONSULT NOTE - Follow Up Consult  Pharmacy Consult for Heparin Indication: atrial fibrillation  Allergies  Allergen Reactions  . Iron     Stomach issues  . Lescol [Fluvastatin Sodium]     Dizziness  . Pentazocine Lactate     unknown  . Vytorin [Ezetimibe-Simvastatin]     Leg cramps   . Aspirin     Bleeding     Patient Measurements: Height: 5\' 10"  (177.8 cm) Weight: 57.6 kg (126 lb 15.8 oz) IBW/kg (Calculated) : 73 Heparin Dosing Weight: 57.6 kg  Vital Signs: Temp: 98.6 F (37 C) (02/15 0851) Temp Source: Oral (02/15 0851) BP: 82/56 (02/15 0851) Pulse Rate: 84 (02/15 0851)  Labs: Recent Labs    01/10/21 2152 01/10/21 2201 01/10/21 2346 01/11/21 0921 01/11/21 1744 01/12/21 0400  HGB 8.0* 8.8*  --   --   --  8.8*  HCT 26.8* 26.0*  --   --   --  29.6*  PLT 231  --   --   --   --  249  HEPARINUNFRC  --   --   --  0.33 0.52 0.47  CREATININE 1.79*  --   --  1.61*  --  1.84*  TROPONINIHS 20*  --  17  --   --   --     Estimated Creatinine Clearance: 23.5 mL/min (A) (by C-G formula based on SCr of 1.84 mg/dL (H)).   Medications:  Scheduled:  . amiodarone  200 mg Oral Daily  . vitamin C  1,000 mg Oral TID  . gatifloxacin  1 drop Both Eyes QID  . pantoprazole  40 mg Oral QHS  . tamsulosin  0.4 mg Oral Daily  . zinc sulfate  220 mg Oral Daily   Infusions:  . heparin 800 Units/hr (01/12/21 0813)  . remdesivir 100 mg in NS 100 mL      Assessment: 87 yo M on heparin for afib.  Heparin level at low end of goal on 800 units/hr.  Heparin is therapeutic this AM. Hgb remains low without bleeding.   Goal of Therapy:  Heparin level 0.3-0.7 units/ml Monitor platelets by anticoagulation protocol: Yes   Plan:  Continue heparin at 800 units/hr. Daily heparin level and CBC.  Onnie Boer, PharmD, BCIDP, AAHIVP, CPP Infectious Disease Pharmacist 01/12/2021 9:19 AM

## 2021-01-12 NOTE — Progress Notes (Signed)
Manufacturing engineer [facility]) Hospital Liaison: RN note     Notified by [facility] [place], RN of patient/family request for [facility] services at home after discharge. Chart and patient information under review by [facility] physician. Hospice eligibility pending currently.     Writer spoke with daughter, [name] to initiate education related to hospice philosophy, services and team approach to care.Lora  verbalized understanding of information given.  Please send signed and completed DNR form home with patient/family. Patient will need prescriptions for discharge comfort medications.      DME needs have been discussed, patient currently has the following equipment in the home:  W/C and 3N1.  Patient/family requests the following DME for delivery to the home:  Hospital bed and walker . [place] equipment manager has been notified and will contact DME provider to arrange delivery to the home. Home address has been verified and is correct in the chart.  [name] is the family member to contact to arrange time of delivery.      [facility] aware of the above. Please notify [facility] when patient is ready to leave the unit at discharge. (Call [phone] or [phone] after 5pm.) [facility] information and contact numbers given to  [facility].       A Please do not hesitate to call with questions.     Thank you,     Farrel Gordon, RN, [facility] (listed on [facility] under [place])     [phone]

## 2021-01-12 NOTE — Progress Notes (Signed)
Physical Therapy Treatment Patient Details Name: [name] [name] MRN: [id number] DOB: July 14, 1932 Today's Date: 01/12/2021    History of Present Illness Pt is an 87 y.o. male admitted from [facility] on 01/10/21 with hypotension; workup for gastroenteritis and dehydration, COVID-19. PMH includes PAF s/p pacemaker, cardiomyopathy s/p ICD, CKD III, anemia, colon CA. Of note, recent admission 09/2020 with COVID, then 11/2020 with VT and d/c to SNF.   PT Comments    Pt slowly progressing with mobility; pleasant and motivated to participate. Today, pt requiring min-maxA for standing activity with RW (+2 for safety); pt aware of bowel incontinence but had not notified nursing, dependent for pericare and linen change. Pt limited by generalized weakness, decreased activity tolerance and cognitive impairment, quick to fatigue with standing activity requiring maxA for safe return to recliner. Per chart, pt planning to d/c with home hospice; will require 24/7 assist from caregivers for transfers and ADL tasks. Will continue to follow acutely.    Follow Up Recommendations  Supervision/Assistance - 24 hour (home w/ hospice)     Equipment Recommendations  Wheelchair (measurements PT);Wheelchair cushion (measurements PT);3in1 (PT);Hospital bed    Recommendations for Other Services       Precautions / Restrictions Precautions Precautions: Fall;Other (comment) Precaution Comments: Bowel incontinence Restrictions Weight Bearing Restrictions: No    Mobility  Bed Mobility   Bed Mobility: Supine to Sit     Supine to sit: Min assist;HOB elevated     General bed mobility comments: MinA for HHA to assist trnk elevation and scooting hips to EOB, cues to use bed rail    Transfers Overall transfer level: Needs assistance Equipment used: Rolling walker (2 wheeled) Transfers: Sit to/from Stand Sit to Stand: Mod assist;Min assist;+2 safety/equipment         General transfer comment:  Initial modA to stand from EOB to RW, verbal/tactile cues to achieve fully upright posture; additional sit<>stands from recliner with minA to min guard for trunk elevation; pt quick to fatigue requiring maxA for safe eccentric control into chair  Ambulation/Gait Ambulation/Gait assistance: Min assist;Max assist;+2 safety/equipment Gait Distance (Feet): 6 Feet Assistive device: Rolling walker (2 wheeled) Gait Pattern/deviations: Step-to pattern;Trunk flexed;Leaning posteriorly Gait velocity: Decreased   General Gait Details: Steps to recliner and additional walking 6' forwards with RW and initial minA and repeated cues for upright posture; pt quick to fatigue requiring +2 assist to bring up chair due to increasing posterior lean requiring maxA for safe return to sit   Stairs             Wheelchair Mobility    Modified Rankin (Stroke Patients Only)       Balance Overall balance assessment: Needs assistance Sitting-balance support: No upper extremity supported;Feet supported Sitting balance-Leahy Scale: Fair     Standing balance support: Bilateral upper extremity supported;During functional activity Standing balance-Leahy Scale: Poor Standing balance comment: Reliant on UE support and external assist; dependent for pericare                            Cognition Arousal/Alertness: Awake/alert Behavior During Therapy: WFL for tasks assessed/performed Overall Cognitive Status: No family/caregiver present to determine baseline cognitive functioning Area of Impairment: Orientation;Attention;Following commands;Safety/judgement;Awareness;Problem solving                   Current Attention Level: Sustained;Selective   Following Commands: Follows one step commands consistently;Follows one step commands with increased time Safety/Judgement: Decreased awareness of safety;Decreased awareness  of deficits Awareness: Intellectual;Emergent Problem Solving: Difficulty  sequencing;Requires verbal cues;Requires tactile cues General Comments: Knows hospital and [facility], but unable to state hospital name; April 2022. Pt aware of bowel incontinence in bed but had not notified nursing      Exercises      General Comments General comments (skin integrity, edema, etc.): Post-transfer to recliner BP 99/68, standing BP 89/60, return to sit BP 81/60, sitting with legs reclined BP 98/62. SpO2 >/95% on RA, HR 85      Pertinent Vitals/Pain Pain Assessment: No/denies pain    Home Living                      Prior Function            PT Goals (current goals can now be found in the care plan section) Progress towards PT goals: Progressing toward goals    Frequency    Min 3X/week      PT Plan Current plan remains appropriate    Co-evaluation PT/OT/SLP Co-Evaluation/Treatment: Yes Reason for Co-Treatment: Necessary to address cognition/behavior during functional activity;For patient/therapist safety;To address functional/ADL transfers PT goals addressed during session: Mobility/safety with mobility;Balance;Proper use of DME        AM-PAC PT "6 Clicks" Mobility   Outcome Measure  Help needed turning from your back to your side while in a flat bed without using bedrails?: A Little Help needed moving from lying on your back to sitting on the side of a flat bed without using bedrails?: A Little Help needed moving to and from a bed to a chair (including a wheelchair)?: A Lot Help needed standing up from a chair using your arms (e.g., wheelchair or bedside chair)?: A Lot Help needed to walk in hospital room?: A Lot Help needed climbing 3-5 steps with a railing? : Total 6 Click Score: 13    End of Session Equipment Utilized During Treatment: Gait belt Activity Tolerance: Patient tolerated treatment well Patient left: in chair;with call bell/phone within reach;with chair alarm set Nurse Communication: Mobility status PT Visit Diagnosis:  Difficulty in walking, not elsewhere classified (R26.2);Muscle weakness (generalized) (M62.81)     Time: 3833-3832 PT Time Calculation (min) (ACUTE ONLY): 29 min  Charges:  $Therapeutic Activity: 8-22 mins                     Mabeline Caras, PT, DPT Acute Rehabilitation Services  Pager [phone] Office [place] 01/12/2021, 4:38 PM

## 2021-01-12 NOTE — TOC Initial Note (Signed)
Transition of Care [facility]) - Initial/Assessment Note    Patient Details  Name: [name] [name] MRN: [id number] Date of Birth: 04/10/1932  [facility] of [facility] CM/SW Contact:    [name] Collet, RN Phone Number: 01/12/2021, 2:52 PM  Clinical Narrative:              Requested by MD to consult for home hospice. Spoke w patient's daughter [name] [name]. Goal of conversation was to assess home environment, explain home services available, and make referral for decided upon plan.  Length of conversation 23 minutes. Verified demographics. Plan, per daughter is to DC to home with home hospice. Patient lives in 800 sq foot apartment and has DME 3/1.She had private duty care arranged with [name] [name] but was told they could not come into house until patient was neg. for COVID. Patient was set up with [facility] from recent stay at [facility].   Although CM afforded very little time to speak, basic differences between [facility], [place], and Home Hospice services were explained to [place] twice. Daughter was too overwhelmed and frustrated with conversation to complete assessment. CM suggested that she may benefit from speaking directly to a home hospice representative, she gave consent for referral to be placed to [place].  Referral given to liaison.       [name] [name] Daughter [phone]         Expected Discharge Plan: Home w Hospice Care Barriers to Discharge: Continued Medical Work up   Patient Goals and CMS Choice Patient states their goals for this hospitalization and ongoing recovery are:: home hospice   Choice offered to / list presented to : Adult Children  Expected Discharge Plan and Services Expected Discharge Plan: Home w Hospice Care   Discharge Planning Services: CM Consult                                          Prior Living Arrangements/Services   Lives with:: Self                   Activities of Daily Living      Permission Sought/Granted                  Emotional  Assessment              Admission diagnosis:  Dehydration [E86.0] Hypotension [I95.9] Hypotension, unspecified hypotension type [I95.9] Pneumonia due to COVID-19 virus [U07.1, J12.82] Patient Active Problem List   Diagnosis Date Noted  . Palliative care by specialist   . Goals of care, counseling/discussion   . DNR (do not resuscitate)   . Hypothyroidism 01/11/2021  . Dehydration 01/11/2021  . Hypotension due to hypovolemia 01/11/2021  . Hypotension 01/11/2021  . Acute on chronic HFrEF (heart failure with reduced ejection fraction) ([place]) 12/13/2020  . Pressure injury of skin 11/04/2020  . Macrocytic anemia 10/23/2020  . Hypoalbuminemia 10/23/2020  . COVID-19 virus infection   . Sepsis ([place])   . Wide-complex tachycardia ([place])   . GI bleed 09/23/2018  . Ulceration of intestine   . Lower GI bleed 07/01/2018  . ICD in place- MDT 2009 07/03/2014  . History of GI bleed-  07/03/2014  . Essential hypertension 07/03/2014  . Acute on chronic systolic (congestive) heart failure ([place]) 10/15/2013  . Dizziness 10/14/2013  . Ischemic cardiomyopathy 10/14/2011  . Automatic implantable cardioverter/defibrillator (AICD) activation 10/02/2011  . A-fib ([place]) 03/21/2011  .  CAD (coronary artery disease) 03/21/2011   PCP:  Crist Infante, MD Pharmacy:  No Pharmacies Listed    Social Determinants of Health (SDOH) Interventions    Readmission Risk Interventions No flowsheet data found.

## 2021-01-12 NOTE — Consult Note (Signed)
Palliative Medicine Inpatient Consult Note  Reason for consult:  Goals of Care  HPI:  Per intake H&P --> [name] [name] is a 87 y.o. male with history of proximal atrial fibrillation status post pacemaker placement, cardiomyopathy status post ICD placement last EF was around 35 to 40%, chronic kidney disease stage III, chronic anemia history of colon cancer admitted November for Covid infection and admitted in January last month for VT at that time was placed on amiodarone presently in rehab was found to be lethargic and hypotensive when has been examined diarrhea.  Patient was brought to the ER for above complaints.  Did not have any chest pain or shortness of breath.  Palliative care was asked to get involved to aid in further goals of care conversations.   Clinical Assessment/Goals of Care:  *Please note that this is a verbal dictation therefore any spelling or grammatical errors are due to the "[place] [employer]" system interpretation.  I have reviewed medical records including EPIC notes, labs and imaging, received report from bedside RN, assessed the patient who was lying in bed. He complained that his wrists hurt with the wrist restrains on therefore we were able to loosen them.    I called patients daughter, [name] [name] to further discuss diagnosis prognosis, GOC, EOL wishes, disposition and options.   I introduced Palliative Medicine as specialized medical care for people living with serious illness. It focuses on providing relief from the symptoms and stress of a serious illness. The goal is to improve quality of life for both the patient and the family.  [name] [name] shares with me that her father is from [place], [place]. He is a widower though has been married to his wife for over 85 years. She passed away over a year ago. He has two children, A son, [name] [name] and a daughter, [name] [name]. He use to work in Chemical engineer. He is a man of faith though is  non-denominational.  Prior to hospitalization in November [name] had been doing fairly. He lived in his 1 bedroom apartment. He could perform most tasks on his own though had a fear of the shower after having suffered a fall. His son would come over daily to help walk his dog. Her [name] [name] much of his days were spend sleeping and watching television.   A detailed discussion was had today regarding advanced directives - there are none on file in [place] though [name] [name] makes the primary decisions for her father with her brother.    Concepts specific to code status, artifical feeding and hydration, continued IV antibiotics and rehospitalization was had. Confirmed that [place] is DNAR/DNI code status.    The difference between a aggressive medical intervention path  and a palliative comfort care path for this patient at this time was had. We reviewed Braiden's comorbid conditions inclusive of his heart failure which is more severe not than ever before. We reviewed that his endurance has declined and that he overall has been in a downward spiral since November of this past year. We discussed the potential for hospice initiation.  I described hospice as a service for patients for have a life expectancy of < 6 months. It preserves dignity and quality at the end phases of life. The focus changes from curative to symptom relief.   [name] [name] focused on the reality that her father wants to be home if he is looking at his final time. She expressed that she wishes her mother could have been home during the end of  her life. She goes on to tell me that she knows this is what her father wants and she is comfortable using his resources to accommodate this if it is within reason. We reviewed that I will request our MSW reach out to her to discuss discharge planning. [name] [name] states that if she can avoid having to have [name] [name] go back to [facility] she would prefer that for him.   For safety we discussed continuing to come into the home and to  also arrange video cameras to view [name] [name] at all times. [name] [name] also plans to hire caregivers.   Discussed the importance of continued conversation with family and their  medical providers regarding overall plan of care and treatment options, ensuring decisions are within the context of the patients values and GOCs.  Decision Maker: [name] [name] (daughter) [phone]  SUMMARY OF RECOMMENDATIONS   DNAR/DNI  Appreciate TOC Team reaching out to daughter  Referral made for home hospice  Ongoing PMT support  Code Status/Advance Care Planning: DNAR/DNI   Symptom Management:  Delirium:  -Try to minimize deliriogenic medications as much as possible (J Am Geriatr Soc. 2012  Apr;60(4):616-31): benzodiazepines, anticholinergics, diphenhydramine, antihistamines,  narcotics, Ambien/Lunesta/[place] etc.  - environmental support for delirium: Lights on during the day, quiet dimly lit room at  night   Palliative Prophylaxis:  Mobility, delirium precuations  Additional Recommendations (Limitations, Scope, Preferences):  Continue current scope of care   Psycho-social/Spiritual:   Desire for further Chaplaincy support: No  Additional Recommendations: Education on chronic debility   Prognosis: Patient with 3 inpatients admission and 1 ED visit in the last six months. (+) Mulitple co-morbid conditions. With his severe frailty and CHF with EF of 25-30% hospice is very much within reason - most especially learning about his decline in the past five months.   Discharge Planning: Discharge likely to home with hospice  Vitals:   01/12/21 0000 01/12/21 0400  BP: 92/72 93/63  Pulse: 88 74  Resp: 19 18  Temp: 97.7 F (36.5 C) 98.2 F (36.8 C)  SpO2: 95% 100%    Intake/Output Summary (Last 24 hours) at 01/12/2021 0923 Last data filed at 01/11/2021 1942 Gross per 24 hour  Intake 1179.49 ml  Output 925 ml  Net 254.49 ml   Last Weight  Most recent update: 01/11/2021  5:10 AM   Weight  57.6 kg  (126 lb 15.8 oz)           Gen:  Frail [place] Caucasian M  HEENT: dry mucous membranes CV: Regular rate and rhythm  PULM: On RA ABD: soft/nontender  EXT: No edema  Neuro: Alert and oriented x3   PPS:  30%   This conversation/these recommendations were discussed with patient primary care team, Dr. Candiss Norse  Time In: 0830 Time Out: 1000 Total Time: 90 Greater than 50%  of this time was spent counseling and coordinating care related to the above assessment and plan.  [place] Team Team Cell Phone: [phone] Please utilize secure chat with additional questions, if there is no response within 30 minutes please call the above phone number  Palliative Medicine Team providers are available by phone from 7am to 7pm daily and can be reached through the team cell phone.  Should this patient require assistance outside of these hours, please call the patient's attending physician.

## 2021-01-12 NOTE — Progress Notes (Signed)
OT Note  Pt seen for eval. Full note to follow. Plan is home with hospice. Recommend HHOT if possible. Will need wc, hospital bed and 3in1.  Maurie Boettcher, OT/L   Acute OT Clinical Specialist Acute Rehabilitation Services Pager [phone] Office [phone]

## 2021-01-12 NOTE — Progress Notes (Signed)
PROGRESS NOTE                                                                                                                                                                                                             Patient Demographics:    [name] [name], is a 87 y.o. male, DOB - [birth year], TIW:[id number]  Outpatient Primary MD for the patient is Crist Infante, MD    LOS - 1  Admit date - 01/10/2021    Chief Complaint  Patient presents with  . Dehydration  . Hypotension       Brief Narrative (HPI from H&P)  - [name] [name] is a 87 y.o. male with history of proximal atrial fibrillation status post pacemaker placement, cardiomyopathy status post ICD placement last EF was around 35 to 40%, chronic kidney disease stage III, chronic anemia history of colon cancer admitted November for Covid infection and admitted in January last month for VT at that time was placed on amiodarone presently in rehab was found to be lethargic and hypotensive with diarrhea.  Was admitted for COVID-19 infection with gastroenteritis and dehydration.   Subjective:   Patient in bed mildly confused but denies any headache chest or abdominal pain, no shortness of breath.   Assessment  & Plan :     1. Acute COVID-19 pneumonia in a patient who has taken 2 shots of vaccine so far -thankfully most of his symptoms are of toxic encephalopathy, no hypoxia.  Hydrate and monitor.  Encouraged the patient to sit up in chair in the daytime use I-S and flutter valve for pulmonary toiletry and then prone in bed when at night.  Will advance activity and titrate down oxygen as possible.    SpO2: 94 %  Recent Labs  Lab 01/10/21 2152 01/10/21 2200 01/10/21 2201 01/11/21 0921 01/12/21 0400  WBC 6.9  --   --   --  6.7  HGB 8.0*  --  8.8*  --  8.8*  HCT 26.8*  --  26.0*  --  29.6*  PLT 231  --   --   --  249  CRP  --   --   --  0.6 0.6  BNP 305.5*  --   --   504.0*  --   DDIMER  --   --   --  1.77* 0.96*  PROCALCITON  --   --   --  <0.10 <0.10  AST 18  --   --   --  25  ALT 13  --   --   --  21  ALKPHOS 37*  --   --   --  43  BILITOT 0.8  --   --   --  0.6  ALBUMIN 2.9*  --   --   --  3.0*  LATICACIDVEN 0.9  --   --   --   --   SARSCOV2NAA  --  POSITIVE*  --   --   --     2.  Dehydration with hypotension.  IV fluids and monitor.  3.  Weakness and deconditioning.  PT OT, daughter now wants to take him home with palliative care/hospice, goal of care is comfort.  4.  BPH.  On Flomax.  5.  History of paroxysmal A. fib.  For now amiodarone, holding beta-blocker and ACE inhibitor due to hypotension and dehydration.  [name] of greater than 3 but on no anticoagulation due to high fall risk, stopped heparin drip, low-dose aspirin, appreciate cardiology input, family wants to focus more on comfort.  6.  COVID-19 induced gastroenteritis.  Supportive care.  No GI complaints.  Nonspecific CT findings, outpatient age-appropriate follow-up by PCP.  Per son no heroics no surgery desired.  7.  Chronic systolic heart failure last EF around 30%.  Dehydrated, improving after gentle IV fluids.  Resume beta-blocker, ACE once blood pressure better.  8.  AKI on CKD 4.  Baseline creatinine 1.7.  Improved after hydration continue to hold ACE as he is still hypotensive.       Condition - Fair  Family Communication  : Louellen Molder [phone] on 01/11/2021 in detail, daughter Mickel Baas [phone] on 01/13/2020  Code Status :  DNR  Consults  :  Cards   Procedures  :     CTA chest.  No PE bilateral groundglass opacities.    CT head.  Nonacute   CT - 1. Mild hyperemia of a short segment of small bowel in the central abdomen, suspicious for enteritis. No obstruction. 2. Small amount of ascites in the right and left upper quadrant. 3. Absent renal excretion on delayed phase imaging suggest underlying renal dysfunction. 4. Chronic biliary dilatation,  unchanged from prior exam, likely related to prior cholecystectomy. Recommend correlation with LFTs. 5. Partially exophytic enhancing lesion from the posterior left kidney measures 14 x 8 mm, previously 13 x 11 mm. This remains concerning for incidental renal neoplasm, however the lack of significant change in size over the course of 2 years suggests this is indolent. Should further workup be pursued, recommend MRI for characterization if patient is able to tolerate breath hold technique, giving consideration for density age. Aortic Atherosclerosis       PUD Prophylaxis : PPI  Disposition Plan  :    Status is: Inpatient  Remains inpatient appropriate because:IV treatments appropriate due to intensity of illness or inability to take PO   Dispo: The patient is from: SNF              Anticipated d/c is to: SNF              Anticipated d/c date is: 3 days              Patient currently is not medically stable to d/c.   Difficult to place patient No   DVT Prophylaxis  :  Heparin   Lab Results  Component Value Date   PLT 249 01/12/2021    Diet :  Diet Order            Diet Heart Room service appropriate? Yes; Fluid consistency: Thin; Fluid restriction: 1200 mL Fluid  Diet effective now                  Inpatient Medications  Scheduled Meds: . amiodarone  200 mg Oral Daily  . vitamin C  1,000 mg Oral TID  . gatifloxacin  1 drop Both Eyes QID  . heparin injection (subcutaneous)  5,000 Units Subcutaneous Q8H  . pantoprazole  40 mg Oral QHS  . tamsulosin  0.4 mg Oral Daily  . zinc sulfate  220 mg Oral Daily   Continuous Infusions: . remdesivir 100 mg in NS 100 mL     PRN Meds:.haloperidol lactate, nitroGLYCERIN, ondansetron (ZOFRAN) IV  Antibiotics  :    Anti-infectives (From admission, onward)   Start     Dose/Rate Route Frequency Ordered Stop   01/12/21 1000  remdesivir 100 mg in sodium chloride 0.9 % 100 mL IVPB       "Followed by" Linked Group Details   100  mg 200 mL/hr over 30 Minutes Intravenous Daily 01/11/21 0056 01/16/21 0959   01/11/21 0200  remdesivir 200 mg in sodium chloride 0.9% 250 mL IVPB       "Followed by" Linked Group Details   200 mg 580 mL/hr over 30 Minutes Intravenous Once 01/11/21 0056 01/11/21 0700       Time Spent in minutes  30   Lala Lund M.D on 01/12/2021 at 9:28 AM  To page go to [email or web address]   [facility] -  Office  [phone]   See all Orders from today for further details    Objective:   Vitals:   01/12/21 0000 01/12/21 0400 01/12/21 0728 01/12/21 0851  BP: 92/72 93/63 93/70  (!) 82/56  Pulse: 88 74 72 84  Resp: 19 18 11 16   Temp: 97.7 F (36.5 C) 98.2 F (36.8 C) 98.2 F (36.8 C) 98.6 F (37 C)  TempSrc: Axillary Oral Oral Oral  SpO2: 95% 100% 91% 94%  Weight:      Height:        Wt Readings from Last 3 Encounters:  01/11/21 57.6 kg  12/25/20 57.2 kg  12/17/20 59.7 kg     Intake/Output Summary (Last 24 hours) at 01/12/2021 0928 Last data filed at 01/12/2021 0840 Gross per 24 hour  Intake 1388.61 ml  Output 925 ml  Net 463.61 ml     Physical Exam  Awake, mildly confused, No new F.N deficits,   [place].AT,PERRAL Supple Neck,No JVD, No cervical lymphadenopathy appriciated.  Symmetrical Chest wall movement, Good air movement bilaterally, CTAB RRR,No Gallops, Rubs or new Murmurs, No Parasternal Heave +ve B.Sounds, Abd Soft, No tenderness, No organomegaly appriciated, No rebound - guarding or rigidity. No Cyanosis, Clubbing or edema, No new Rash or bruise    Data Review:    CBC Recent Labs  Lab 01/10/21 2152 01/10/21 2201 01/12/21 0400  WBC 6.9  --  6.7  HGB 8.0* 8.8* 8.8*  HCT 26.8* 26.0* 29.6*  PLT 231  --  249  MCV 90.8  --  90.2  MCH 27.1  --  26.8  MCHC 29.9*  --  29.7*  RDW 16.5*  --  16.5*  LYMPHSABS 0.6*  --  0.4*  MONOABS 0.5  --  0.5  EOSABS 0.1  --  0.0  BASOSABS 0.0  --  0.0    Recent Labs  Lab 01/10/21 2152 01/10/21 2201 01/11/21 0921  01/12/21 0400  NA 135 138 135 136  K 4.1 4.2 4.2 4.4  CL 103  --  103 105  CO2 24  --  19* 21*  GLUCOSE 102*  --  145* 112*  BUN 37*  --  35* 39*  CREATININE 1.79*  --  1.61* 1.84*  CALCIUM 8.3*  --  8.6* 8.9  AST 18  --   --  25  ALT 13  --   --  21  ALKPHOS 37*  --   --  43  BILITOT 0.8  --   --  0.6  ALBUMIN 2.9*  --   --  3.0*  MG 2.0  --  2.0  --   CRP  --   --  0.6 0.6  DDIMER  --   --  1.77* 0.96*  PROCALCITON  --   --  <0.10 <0.10  LATICACIDVEN 0.9  --   --   --   TSH 9.056*  --   --   --   AMMONIA 15  --   --   --   BNP 305.5*  --  504.0*  --     ------------------------------------------------------------------------------------------------------------------ No results for input(s): CHOL, HDL, LDLCALC, TRIG, CHOLHDL, LDLDIRECT in the last 72 hours.  Lab Results  Component Value Date   HGBA1C  06/11/2007    5.7 (NOTE)   The ADA recommends the following therapeutic goals for glycemic   control related to Hgb A1C measurement:   Goal of Therapy:   < 7.0% Hgb A1C   Action Suggested:  > 8.0% Hgb A1C   Ref:  Diabetes Care, 22, Suppl. 1, 1999   ------------------------------------------------------------------------------------------------------------------ Recent Labs    01/10/21 2152  TSH 9.056*    Cardiac Enzymes No results for input(s): CKMB, TROPONINI, MYOGLOBIN in the last 168 hours.  Invalid input(s): CK ------------------------------------------------------------------------------------------------------------------    Component Value Date/Time   BNP 504.0 (H) 01/11/2021 0921   BNP 379.4 (H) 02/25/2016 0951    Micro Results Recent Results (from the past 240 hour(s))  Blood culture (routine x 2)     Status: None (Preliminary result)   Collection Time: 01/10/21  9:52 PM   Specimen: BLOOD  Result Value Ref Range Status   Specimen Description BLOOD LEFT ARM  Final   Special Requests   Final    BOTTLES DRAWN AEROBIC AND ANAEROBIC Blood Culture adequate  volume   Culture   Final    NO GROWTH < 24 HOURS Performed at [facility], [place] [address] [place], [place] [address]    Report Status PENDING  Incomplete  Resp Panel by RT-PCR (Flu A&B, Covid) Nasopharyngeal Swab     Status: Abnormal   Collection Time: 01/10/21 10:00 PM   Specimen: Nasopharyngeal Swab; Nasopharyngeal(NP) swabs in vial transport medium  Result Value Ref Range Status   SARS Coronavirus 2 by RT PCR POSITIVE (A) NEGATIVE Final    Comment: RESULT CALLED TO, READ BACK BY AND VERIFIED WITH: K. SETRAM,RN 0006 01/11/2021 T. TYSOR (NOTE) SARS-CoV-2 target nucleic acids are DETECTED.  The SARS-CoV-2 RNA is generally detectable in upper respiratory specimens during the acute phase of infection. Positive results are indicative of the presence of the identified virus, but do not rule out bacterial infection or co-infection with other pathogens not detected by the test. Clinical correlation with patient history and other diagnostic information  is necessary to determine patient infection status. The expected result is Negative.  Fact Sheet for Patients: EntrepreneurPulse.com.au  Fact Sheet for Healthcare Providers: IncredibleEmployment.be  This test is not yet approved or cleared by the [place] FDA and  has been authorized for detection and/or diagnosis of SARS-CoV-2 by FDA under an Emergency Use Authorization (EUA).  This EUA will remain in effect (meaning this test ca n be used) for the duration of  the COVID-19 declaration under Section 564(b)(1) of the Act, 21 U.S.C. section 360bbb-3(b)(1), unless the authorization is terminated or revoked sooner.     Influenza A by PCR NEGATIVE NEGATIVE Final   Influenza B by PCR NEGATIVE NEGATIVE Final    Comment: (NOTE) The Xpert Xpress SARS-CoV-2/FLU/RSV plus assay is intended as an aid in the diagnosis of influenza from Nasopharyngeal swab specimens and should not be used as a sole  basis for treatment. Nasal washings and aspirates are unacceptable for Xpert Xpress SARS-CoV-2/FLU/RSV testing.  Fact Sheet for Patients: EntrepreneurPulse.com.au  Fact Sheet for Healthcare Providers: IncredibleEmployment.be  This test is not yet approved or cleared by the [place] FDA and has been authorized for detection and/or diagnosis of SARS-CoV-2 by FDA under an Emergency Use Authorization (EUA). This EUA will remain in effect (meaning this test can be used) for the duration of the COVID-19 declaration under Section 564(b)(1) of the Act, 21 U.S.C. section 360bbb-3(b)(1), unless the authorization is terminated or revoked.  Performed at [facility], [place] [address], [place], [place] [address]   Blood culture (routine x 2)     Status: None (Preliminary result)   Collection Time: 01/10/21 11:45 PM   Specimen: BLOOD  Result Value Ref Range Status   Specimen Description BLOOD RIGHT HAND  Final   Special Requests   Final    BOTTLES DRAWN AEROBIC AND ANAEROBIC Blood Culture results may not be optimal due to an inadequate volume of blood received in culture bottles   Culture   Final    NO GROWTH < 24 HOURS Performed at [facility], [place] [address], [place], [place] [address]    Report Status PENDING  Incomplete  MRSA PCR Screening     Status: None   Collection Time: 01/11/21  7:48 AM   Specimen: Nasal Mucosa; Nasopharyngeal  Result Value Ref Range Status   MRSA by PCR NEGATIVE NEGATIVE Final    Comment:        The GeneXpert MRSA Assay (FDA approved for NASAL specimens only), is one component of a comprehensive MRSA colonization surveillance program. It is not intended to diagnose MRSA infection nor to guide or monitor treatment for MRSA infections. Performed at [facility], [place] [address], [place], [place] [address]     Radiology Reports CT Head Wo Contrast  Result Date: 01/10/2021 CLINICAL DATA:  Mental  status changes of unknown cause. EXAM: CT HEAD WITHOUT CONTRAST TECHNIQUE: Contiguous axial images were obtained from the base of the skull through the vertex without intravenous contrast. COMPARISON:  10/14/2013 FINDINGS: Brain: Age related generalized atrophy. Chronic small-vessel ischemic changes of the cerebral hemispheric white matter. No sign of acute infarction, mass lesion, hemorrhage, hydrocephalus or extra-axial collection. Vascular: There is atherosclerotic calcification of the major vessels at the base of the brain. Skull: Negative Sinuses/Orbits: Clear/normal Other: None IMPRESSION: No acute finding by CT. Age related atrophy and chronic small-vessel ischemic changes of the white matter. Electronically Signed   By: Nelson Chimes M.D.   On: 01/10/2021 21:14  CT Angio Chest PE W and/or Wo Contrast  Result Date: 01/10/2021 CLINICAL DATA:  PE suspected, high prob Weakness and lethargy. EXAM: CT ANGIOGRAPHY CHEST WITH CONTRAST TECHNIQUE: Multidetector CT imaging of the chest was performed using the standard protocol during bolus administration of intravenous contrast. Multiplanar CT image reconstructions and MIPs were obtained to evaluate the vascular anatomy. CONTRAST:  42mL OMNIPAQUE IOHEXOL 350 MG/ML SOLN COMPARISON:  Radiograph earlier today.  Chest CT 10/14/2013 FINDINGS: Cardiovascular: There are no filling defects within the pulmonary arteries to suggest pulmonary embolus. Left-sided pacemaker in place. Aortic atherosclerosis and tortuosity. Multi chamber cardiomegaly with contrast refluxing into the hepatic veins and IVC. No pericardial effusion. Post CABG with native coronary artery calcification. Contrast refluxes into chest wall collaterals in the azygos system. Mediastinum/Nodes: Shotty mediastinal lymph nodes, all subcentimeter. No hilar adenopathy. Decompressed esophagus. No thyroid nodule. Lungs/Pleura: Small bilateral pleural effusions, right greater than left. Scattered reticular and  ground-glass opacities throughout both lungs, likely representing scarring, many at site of prior nodular airspace disease. Small amount of fluid in the right major fissure, with questionable fissural nodularity. Trachea and central bronchi are patent. No pulmonary mass. Upper Abdomen: Assessed on concurrent abdominal CT, reported separately. Musculoskeletal: Scoliosis and ordinary degenerative change in the spine. There are no acute or suspicious osseous abnormalities. Review of the MIP images confirms the above findings. IMPRESSION: 1. No pulmonary embolus. 2. Multi chamber cardiomegaly with contrast refluxing into the hepatic veins and IVC consistent with elevated right heart pressures. Small bilateral pleural effusions, right greater than left. 3. Scattered reticular and ground-glass opacities throughout both lungs, likely representing scarring, many at site of prior nodular airspace disease. Pneumonia is not entirely excluded, recommend correlation for acute infective symptoms. Aortic Atherosclerosis (ICD10-I70.0). Electronically Signed   By: Keith Rake M.D.   On: 01/10/2021 23:45   CT ABDOMEN PELVIS W CONTRAST  Result Date: 01/10/2021 CLINICAL DATA:  Epigastric abdominal pain. Unexplained hypotension. Lethargy and weakness. EXAM: CT ABDOMEN AND PELVIS WITH CONTRAST TECHNIQUE: Multidetector CT imaging of the abdomen and pelvis was performed using the standard protocol following bolus administration of intravenous contrast. CONTRAST:  81mL OMNIPAQUE IOHEXOL 350 MG/ML SOLN COMPARISON:  CT 419 FINDINGS: Lower chest: Small pleural effusions and multi chamber cardiomegaly. Detailed assessment on concurrent chest CT, reported separately. Hepatobiliary: No focal liver abnormality. Post cholecystectomy. There is chronic biliary dilatation with mid common bile duct measuring 15 mm. This appears similar to prior exam. There is central intrahepatic biliary ductal dilatation. No evidence of choledocholithiasis.  Pancreas: Parenchymal atrophy. No ductal dilatation or inflammation. Spleen: Normal in size without focal abnormality. Surgical clips at the hilum. Adrenals/Urinary Tract: Stable left adrenal thickening. Normal right adrenal gland. No suspicious nodule. No hydronephrosis or perinephric edema. Small cysts in the upper right kidney, as well as low-density lesions too small to characterize. Partially exophytic enhancing lesion from the posterior left kidney measures 14 x 8 mm, previously 13 x 11 mm. Absent renal excretion on delayed phase imaging. Urinary bladder is distended. No bladder wall thickening. Stomach/Bowel: Decompressed stomach. Few fluid-filled bowel small bowel loops in the lower abdomen. Mild hyperemia of a short segment of small bowel in the central abdomen, series 3, image 40. No obstruction. Presumed prior right hemicolectomy with enteric anastomosis in the right upper quadrant. Moderate stool in the distal colon. Surgical clips adjacent to the rectosigmoid. Vascular/Lymphatic: Aortic atherosclerosis and tortuosity. No aortic aneurysm. No acute vascular findings. Patent portal vein. No enlarged lymph nodes in the abdomen or pelvis. Reproductive:  Prominent prostate gland. Other: Small amount of ascites in the right and left upper quadrant. No free air or focal fluid collection. Musculoskeletal: Scoliosis and degenerative change throughout the spine. There are no acute or suspicious osseous abnormalities. IMPRESSION: 1. Mild hyperemia of a short segment of small bowel in the central abdomen, suspicious for enteritis. No obstruction. 2. Small amount of ascites in the right and left upper quadrant. 3. Absent renal excretion on delayed phase imaging suggest underlying renal dysfunction. 4. Chronic biliary dilatation, unchanged from prior exam, likely related to prior cholecystectomy. Recommend correlation with LFTs. 5. Partially exophytic enhancing lesion from the posterior left kidney measures 14 x 8 mm,  previously 13 x 11 mm. This remains concerning for incidental renal neoplasm, however the lack of significant change in size over the course of 2 years suggests this is indolent. Should further workup be pursued, recommend MRI for characterization if patient is able to tolerate breath hold technique, giving consideration for density age. Aortic Atherosclerosis (ICD10-I70.0). Electronically Signed   By: Keith Rake M.D.   On: 01/10/2021 23:53   DG Chest Portable 1 View  Result Date: 01/10/2021 CLINICAL DATA:  Altered mental status EXAM: PORTABLE CHEST 1 VIEW COMPARISON:  December 13, 2020 FINDINGS: There is a dual chamber pacemaker/ICD in place. The heart size is enlarged. There is a small left-sided pleural effusion. There are chronic airspace opacities bilaterally. No pneumothorax. Atelectasis versus scarring is noted at the left lung base. There is no definite acute osseous abnormality. IMPRESSION: 1. Cardiomegaly with small left pleural effusion. 2. Scattered chronic airspace opacities bilaterally favored to represent scarring or atelectasis. Electronically Signed   By: Constance Holster M.D.   On: 01/10/2021 20:47   DG Chest Port 1 View  Result Date: 12/13/2020 CLINICAL DATA:  . Pt's daughter reports seizure-like activity lasting appro 1 minute. No h of seizure. H of CHF, COPD, A-Fib.Fire department arrived prior to EMS reported shallow breathing and unable to obtain carotid pulse EXAM: PORTABLE CHEST 1 VIEW COMPARISON:  12/11/2020 FINDINGS: Stable changes from prior CABG surgery. Cardiac silhouette is mildly enlarged. Mediastinal or hilar masses. Left anterior chest wall AICD is stable. Prominent interstitial and vascular markings bilaterally. There is additional opacity at the left lung base, which appears more prominent than on the recent prior study. This may reflect atelectasis or infiltrate. Possible small left pleural effusion. No evidence of a right pleural effusion. No pneumothorax.  Skeletal structures grossly intact IMPRESSION: 1. Opacity at the left lung base, which may reflect atelectasis or pneumonia. 2. Vascular and interstitial prominence similar to the prior study without convincing pulmonary edema. Possible small left pleural effusion. Electronically Signed   By: Lajean Manes M.D.   On: 12/13/2020 12:44   DG Abd Portable 1V  Result Date: 01/11/2021 CLINICAL DATA:  Nausea and vomiting in a patient who is COVID positive. EXAM: PORTABLE ABDOMEN - 1 VIEW COMPARISON:  CT chest, abdomen and pelvis yesterday. FINDINGS: The bowel gas pattern is normal. No radio-opaque calculi or other significant radiographic abnormality are seen. Contrast in the urinary bladder from the patient's CT scan is noted. There is marked convex left scoliosis and multilevel degenerative change. Surgical clips there is scattered in the abdomen and pelvis. IMPRESSION: No acute abnormality. Electronically Signed   By: Inge Rise M.D.   On: 01/11/2021 11:16   CUP PACEART INCLINIC DEVICE CHECK  Result Date: 12/25/2020 Pacemaker check in clinic. Normal device function. Thresholds, sensing, impedances consistent with previous measurements. Device programmed to maximize  longevity. No new high ventricular rates noted. Device programmed at appropriate safety margins. Histogram distribution appropriate for patient activity level. Device programmed to optimize intrinsic conduction. Estimated longevity _11 years__. Patient enrolled in remote follow-up

## 2021-01-13 DIAGNOSIS — E861 Hypovolemia: Secondary | ICD-10-CM | POA: Diagnosis not present

## 2021-01-13 DIAGNOSIS — I9589 Other hypotension: Secondary | ICD-10-CM | POA: Diagnosis not present

## 2021-01-13 LAB — COMPREHENSIVE METABOLIC PANEL
ALT: 20 U/L (ref 0–44)
AST: 23 U/L (ref 15–41)
Albumin: 3 g/dL — ABNORMAL LOW (ref 3.5–5.0)
Alkaline Phosphatase: 45 U/L (ref 38–126)
Anion gap: 9 (ref 5–15)
BUN: 44 mg/dL — ABNORMAL HIGH (ref 8–23)
CO2: 22 mmol/L (ref 22–32)
Calcium: 8.8 mg/dL — ABNORMAL LOW (ref 8.9–10.3)
Chloride: 105 mmol/L (ref 98–111)
Creatinine, Ser: 1.8 mg/dL — ABNORMAL HIGH (ref 0.61–1.24)
GFR, Estimated: 36 mL/min — ABNORMAL LOW (ref >60)
Glucose, Bld: 98 mg/dL (ref 70–99)
Potassium: 4.4 mmol/L (ref 3.5–5.1)
Sodium: 136 mmol/L (ref 135–145)
Total Bilirubin: 0.7 mg/dL (ref 0.3–1.2)
Total Protein: 5.5 g/dL — ABNORMAL LOW (ref 6.5–8.1)

## 2021-01-13 LAB — CBC WITH DIFFERENTIAL/PLATELET
Abs Immature Granulocytes: 0.05 10*3/uL (ref 0.00–0.07)
Basophils Absolute: 0 10*3/uL (ref 0.0–0.1)
Basophils Relative: 0 %
Eosinophils Absolute: 0 10*3/uL (ref 0.0–0.5)
Eosinophils Relative: 0 %
HCT: 26.6 % — ABNORMAL LOW (ref 39.0–52.0)
Hemoglobin: 8.6 g/dL — ABNORMAL LOW (ref 13.0–17.0)
Immature Granulocytes: 1 %
Lymphocytes Relative: 5 %
Lymphs Abs: 0.4 10*3/uL — ABNORMAL LOW (ref 0.7–4.0)
MCH: 28.6 pg (ref 26.0–34.0)
MCHC: 32.3 g/dL (ref 30.0–36.0)
MCV: 88.4 fL (ref 80.0–100.0)
Monocytes Absolute: 0.6 10*3/uL (ref 0.1–1.0)
Monocytes Relative: 8 %
Neutro Abs: 6.1 10*3/uL (ref 1.7–7.7)
Neutrophils Relative %: 86 %
Platelets: 201 10*3/uL (ref 150–400)
RBC: 3.01 MIL/uL — ABNORMAL LOW (ref 4.22–5.81)
RDW: 16.7 % — ABNORMAL HIGH (ref 11.5–15.5)
WBC: 7.2 10*3/uL (ref 4.0–10.5)
nRBC: 0 % (ref 0.0–0.2)

## 2021-01-13 LAB — D-DIMER, QUANTITATIVE: D-Dimer, Quant: 0.77 ug/mL-FEU — ABNORMAL HIGH (ref 0.00–0.50)

## 2021-01-13 LAB — PROCALCITONIN: Procalcitonin: <0.1 ng/mL

## 2021-01-13 LAB — C-REACTIVE PROTEIN: CRP: 1.1 mg/dL — ABNORMAL HIGH (ref <1.0)

## 2021-01-13 NOTE — Progress Notes (Signed)
Occupational Therapy Evaluation - late entry  Plan is for pt to DC to daughter's home with Hospice. Pt would benefit form HHOT is an option to assist daughter in caring for her father. Pt required max A +2 to pivot to chair this pm. Mod to Max A with ADL tasks. Pt pleasant and cooperative. Will follow acutely to facilitate safe DC home.     01/12/21 1700  OT Visit Information  Last OT Received On 01/12/21  Assistance Needed +2  PT/OT/SLP Co-Evaluation/Treatment Yes  Reason for Co-Treatment Necessary to address cognition/behavior during functional activity;For patient/therapist safety;To address functional/ADL transfers  OT goals addressed during session ADL's and self-care  History of Present Illness Pt is an 87 y.o. male admitted from [facility] on 01/10/21 with hypotension; workup for gastroenteritis and dehydration, COVID-19. PMH includes PAF s/p pacemaker, cardiomyopathy s/p ICD, CKD III, anemia, colon CA. Of note, recent admission 09/2020 with COVID, then 11/2020 with VT and d/c to SNF.  Precautions  Precautions Fall  Home Living  Family/patient expects to be discharged to: Private residence  Living Arrangements Children  Available Help at Discharge Family;Available 24 hours/day  Home Equipment [place] - single point;Walker - 2 wheels;Other (comment);Walker - 4 wheels  Additional Comments plan is to DC to daughter's home with [facility]  Prior Function  Level of Independence Needs assistance  Gait / Transfers Assistance Needed ambulates with RW vs cane  ADL's / Homemaking Assistance Needed family provides groceries; stands at sink to bathe  Communication  Communication HOH  Pain Assessment  Pain Assessment Faces  Faces Pain Scale 4  Pain Location general discomfort wtih standing  Pain Descriptors / Indicators Discomfort;Grimacing  Pain Intervention(s) Limited activity within patient's tolerance  Cognition  Arousal/Alertness Awake/alert  Behavior During Therapy [facility] for  tasks assessed/performed  Overall Cognitive Status No family/caregiver present to determine baseline cognitive functioning  Area of Impairment Orientation;Attention;Memory;Safety/judgement;Awareness;Problem solving  Orientation Level Disoriented to;Place;Time  Current Attention Level Selective  Memory Decreased short-term memory  Following Commands Follows one step commands with increased time  Safety/Judgement Decreased awareness of safety;Decreased awareness of deficits  Awareness Emergent  Problem Solving Slow processing;Difficulty sequencing  Upper Extremity Assessment  Upper Extremity Assessment Generalized weakness  Lower Extremity Assessment  Lower Extremity Assessment Defer to PT evaluation  Cervical / Trunk Assessment  Cervical / Trunk Assessment Kyphotic  ADL  Overall ADL's  Needs assistance/impaired  Eating/Feeding Set up  Grooming Set up;Supervision/safety;Sitting  Upper Body Bathing Set up;Supervision/ safety;Sitting  Lower Body Bathing Moderate assistance;Sit to/from stand  Upper Body Dressing  Supervision/safety;Set up;Sitting  Lower Body Dressing Maximal assistance;Sit to/from Retail buyer +2 for physical assistance;Maximal assistance;Stand-pivot  Armed forces technical officer Details (indicate cue type and reason) strong posterior push  Toileting- Clothing Manipulation and Hygiene Total assistance  Toileting - Clothing Manipulation Details (indicate cue type and reason) sitting in watery BM on entry however states he is "proud of what he has done", referring to his ability to have a BM  Functional mobility during ADLs +2 for physical assistance;Maximal assistance (Mod A to stand +1; Max A +2 to pivot)  Bed Mobility  Overal bed mobility Needs Assistance  Bed Mobility Supine to Sit  Supine to sit Mod assist;HOB elevated  Transfers  Overall transfer level Needs assistance  Transfers Sit to/from Stand;Stand Pivot Transfers  Sit to Stand Mod assist;Min assist;+2  safety/equipment  Stand pivot transfers Max assist;+2 physical assistance  General transfer comment May do well with [facility]  Balance  Overall balance assessment  Needs assistance  Sitting-balance support No upper extremity supported;Feet supported  Sitting balance-Leahy Scale Fair  Standing balance support Bilateral upper extremity supported;During functional activity  Standing balance-Leahy Scale Poor  Standing balance comment Reliant on UE support and external assist; dependent for pericare  General Comments  General comments (skin integrity, edema, etc.) Post-transfer to recliner BP 99/68, standing BP 89/60, return to sit BP 81/60, sitting with legs reclined BP 98/62. SpO2 >/95% on RA, HR 85  OT - End of Session  Equipment Utilized During Treatment Gait belt;Rolling walker  Activity Tolerance Patient tolerated treatment well  Patient left in chair;with call bell/phone within reach;with chair alarm set  Nurse Communication Mobility status  OT Assessment  OT Recommendation/Assessment Patient needs continued OT Services  OT Visit Diagnosis Unsteadiness on feet (R26.81);Other abnormalities of gait and mobility (R26.89);Muscle weakness (generalized) (M62.81);Pain;Other symptoms and signs involving cognitive function  OT Problem List Decreased strength;Decreased activity tolerance;Impaired balance (sitting and/or standing);Decreased cognition;Decreased safety awareness;Decreased knowledge of use of DME or AE;Cardiopulmonary status limiting activity;Pain  OT Plan  OT Frequency (ACUTE ONLY) Min 2X/week  AM-PAC OT "6 Clicks" Daily Activity Outcome Measure (Version 2)  Help from another person eating meals? 3  Help from another person taking care of personal grooming? 3  Help from another person toileting, which includes using toliet, bedpan, or urinal? 1  Help from another person bathing (including washing, rinsing, drying)? 2  Help from another person to put on and taking off regular upper body  clothing? 3  Help from another person to put on and taking off regular lower body clothing? 2  6 Click Score 14  OT Recommendation  Follow Up Recommendations Home health OT;Supervision/Assistance - 24 hour (plan is home with hospice)  OT Equipment Wheelchair (measurements OT);Wheelchair cushion (measurements OT);Hospital bed  Individuals Consulted  Consulted and Agree with Results and Recommendations Patient  Acute Rehab OT Goals  Patient Stated Goal home  OT Goal Formulation With patient  Time For Goal Achievement 01/27/21  Potential to Achieve Goals Good  OT Time Calculation  OT Start Time (ACUTE ONLY) 1514  OT Stop Time (ACUTE ONLY) 1543  OT Time Calculation (min) 29 min  OT General Charges  $OT Visit 1 Visit  Written Expression  Dominant Hand Right  Maurie Boettcher, OT/L   Acute OT Clinical Specialist [place] Pager [phone] Office [phone]

## 2021-01-13 NOTE — Progress Notes (Signed)
Pt went to yellow ME[facility]. Charge RN Vicente Males made aware, pt in no distress. Will continue to monitor.

## 2021-01-13 NOTE — Progress Notes (Signed)
PROGRESS NOTE                                                                                                                                                                                                             Patient Demographics:    [name] [name], is a 87 y.o. male, DOB - 25-Aug-1932, GYB:[id number]  Outpatient Primary MD for the patient is [name] Infante, MD    LOS - 2  Admit date - 01/10/2021    Chief Complaint  Patient presents with  . Dehydration  . Hypotension       Brief Narrative (HPI from H&P)  - [name] [name] is a 87 y.o. male with history of proximal atrial fibrillation status post pacemaker placement, cardiomyopathy status post ICD placement last EF was around 35 to 40%, chronic kidney disease stage III, chronic anemia history of colon cancer admitted November for Covid infection and admitted in January last month for VT at that time was placed on amiodarone presently in rehab was found to be lethargic and hypotensive with diarrhea.  Was admitted for COVID-19 infection with gastroenteritis and dehydration.   Subjective:   Patient in bed, appears comfortable but confused, denies any headache, no fever, no chest pain or pressure, no shortness of breath , no abdominal pain. No focal weakness.   Assessment  & Plan :     1. Acute COVID-19 pneumonia in a patient who has taken 2 shots of vaccine so far -thankfully most of his symptoms are of toxic encephalopathy, no hypoxia.  Hydrated and monitor.  Encouraged the patient to sit up in chair in the daytime use I-S and flutter valve for pulmonary toiletry and then prone in bed when at night.  Will advance activity and titrate down oxygen as possible.    SpO2: 90 %  Recent Labs  Lab 01/10/21 2152 01/10/21 2200 01/10/21 2201 01/11/21 0921 01/12/21 0400 01/13/21 0141  WBC 6.9  --   --   --  6.7 7.2  HGB 8.0*  --  8.8*  --  8.8* 8.6*  HCT 26.8*  --  26.0*   --  29.6* 26.6*  PLT 231  --   --   --  249 201  CRP  --   --   --  0.6 0.6 1.1*  BNP 305.5*  --   --  504.0*  --   --   DDIMER  --   --   --  1.77* 0.96* 0.77*  PROCALCITON  --   --   --  <0.10 <0.10 <0.10  AST 18  --   --   --  25 23  ALT 13  --   --   --  21 20  ALKPHOS 37*  --   --   --  43 45  BILITOT 0.8  --   --   --  0.6 0.7  ALBUMIN 2.9*  --   --   --  3.0* 3.0*  LATICACIDVEN 0.9  --   --   --   --   --   SARSCOV2NAA  --  POSITIVE*  --   --   --   --     2.  Dehydration with hypotension.  Post IV fluids and monitor.  3.  Weakness and deconditioning.  PT OT, daughter now wants to take him home with palliative care/hospice, goal of care is comfort on Friday.  4.  BPH.  On Flomax.  5.  History of paroxysmal A. fib. For now amiodarone, holding beta-blocker and ACE inhibitor due to hypotension and dehydration.  [name] of greater than 3 but on no anticoagulation due to high fall risk, stopped heparin drip, low-dose aspirin, appreciate cardiology input, family wants to focus more on comfort.  6.  COVID-19 induced gastroenteritis.  Supportive care.  No GI complaints.  Nonspecific CT findings, outpatient age-appropriate follow-up by PCP.  Per son no heroics no surgery desired.  7.  Chronic systolic heart failure last EF around 30%.  Dehydrated, improving after gentle IV fluids.  Resume beta-blocker, ACE once blood pressure better.  8.  AKI on CKD 4.  Baseline creatinine 1.7.  Improved after hydration continue to hold ACE as he is still hypotensive.       Condition - Fair  Family Communication  : [name] [name] [phone] on 01/11/2021 in detail, daughter [name] [name] [phone] on 01/13/2020  Code Status :  DNR  Consults  :  Cards   Procedures  :     CTA chest.  No PE bilateral groundglass opacities.    CT head.  Nonacute   CT - 1. Mild hyperemia of a short segment of small bowel in the central abdomen, suspicious for enteritis. No obstruction. 2. Small amount of  ascites in the right and left upper quadrant. 3. Absent renal excretion on delayed phase imaging suggest underlying renal dysfunction. 4. Chronic biliary dilatation, unchanged from prior exam, likely related to prior cholecystectomy. Recommend correlation with LFTs. 5. Partially exophytic enhancing lesion from the posterior left kidney measures 14 x 8 mm, previously 13 x 11 mm. This remains concerning for incidental renal neoplasm, however the lack of significant change in size over the course of 2 years suggests this is indolent. Should further workup be pursued, recommend MRI for characterization if patient is able to tolerate breath hold technique, giving consideration for density age. Aortic Atherosclerosis       PUD Prophylaxis : PPI  Disposition Plan  :    Status is: Inpatient  Remains inpatient appropriate because:IV treatments appropriate due to intensity of illness or inability to take PO   Dispo: The patient is from: SNF              Anticipated d/c is to: SNF              Anticipated d/c date is:  3 days              Patient currently is not medically stable to d/c.   Difficult to place patient No   DVT Prophylaxis  :   Heparin   Lab Results  Component Value Date   PLT 201 01/13/2021    Diet :  Diet Order            Diet Heart Room service appropriate? Yes; Fluid consistency: Thin; Fluid restriction: 1200 mL Fluid  Diet effective now                  Inpatient Medications  Scheduled Meds: . amiodarone  200 mg Oral Daily  . vitamin C  1,000 mg Oral TID  . gatifloxacin  1 drop Both Eyes QID  . heparin injection (subcutaneous)  5,000 Units Subcutaneous Q8H  . pantoprazole  40 mg Oral QHS  . tamsulosin  0.4 mg Oral Daily  . zinc sulfate  220 mg Oral Daily   Continuous Infusions: . remdesivir 100 mg in NS 100 mL 100 mg (01/13/21 0922)   PRN Meds:.haloperidol lactate, nitroGLYCERIN, ondansetron (ZOFRAN) IV  Antibiotics  :    Anti-infectives (From admission,  onward)   Start     Dose/Rate Route Frequency Ordered Stop   01/12/21 1000  remdesivir 100 mg in sodium chloride 0.9 % 100 mL IVPB       "Followed by" Linked Group Details   100 mg 200 mL/hr over 30 Minutes Intravenous Daily 01/11/21 0056 01/16/21 0959   01/11/21 0200  remdesivir 200 mg in sodium chloride 0.9% 250 mL IVPB       "Followed by" Linked Group Details   200 mg 580 mL/hr over 30 Minutes Intravenous Once 01/11/21 0056 01/11/21 0700       Time Spent in minutes  30   Lala Lund M.D on 01/13/2021 at 11:34 AM  To page go to [email or web address]   [facility] -  Office  [phone]   See all Orders from today for further details    Objective:   Vitals:   01/12/21 2030 01/13/21 0450 01/13/21 0733 01/13/21 0908  BP: 95/67 96/65 100/67 102/64  Pulse: 85 77 84 90  Resp: 16 12 16 18   Temp: 97.6 F (36.4 C) 97.8 F (36.6 C) 97.8 F (36.6 C) 97.9 F (36.6 C)  TempSrc: Oral Axillary Axillary Axillary  SpO2: 95% 95% 95% 90%  Weight:      Height:        Wt Readings from Last 3 Encounters:  01/11/21 57.6 kg  12/25/20 57.2 kg  12/17/20 59.7 kg     Intake/Output Summary (Last 24 hours) at 01/13/2021 1134 Last data filed at 01/13/2021 0510 Gross per 24 hour  Intake 100 ml  Output 700 ml  Net -600 ml     Physical Exam  Awake, mildly confused, No new F.N deficits,   [place].AT,PERRAL Supple Neck,No JVD, No cervical lymphadenopathy appriciated.  Symmetrical Chest wall movement, Good air movement bilaterally, CTAB RRR,No Gallops, Rubs or new Murmurs, No Parasternal Heave +ve B.Sounds, Abd Soft, No tenderness, No organomegaly appriciated, No rebound - guarding or rigidity. No Cyanosis, Clubbing or edema, No new Rash or bruise     Data Review:    CBC Recent Labs  Lab 01/10/21 2152 01/10/21 2201 01/12/21 0400 01/13/21 0141  WBC 6.9  --  6.7 7.2  HGB 8.0* 8.8* 8.8* 8.6*  HCT 26.8* 26.0* 29.6* 26.6*  PLT 231  --  249 201  MCV 90.8  --  90.2 88.4  MCH  27.1  --  26.8 28.6  MCHC 29.9*  --  29.7* 32.3  RDW 16.5*  --  16.5* 16.7*  LYMPHSABS 0.6*  --  0.4* 0.4*  MONOABS 0.5  --  0.5 0.6  EOSABS 0.1  --  0.0 0.0  BASOSABS 0.0  --  0.0 0.0    Recent Labs  Lab 01/10/21 2152 01/10/21 2201 01/11/21 0921 01/12/21 0400 01/13/21 0141  NA 135 138 135 136 136  K 4.1 4.2 4.2 4.4 4.4  CL 103  --  103 105 105  CO2 24  --  19* 21* 22  GLUCOSE 102*  --  145* 112* 98  BUN 37*  --  35* 39* 44*  CREATININE 1.79*  --  1.61* 1.84* 1.80*  CALCIUM 8.3*  --  8.6* 8.9 8.8*  AST 18  --   --  25 23  ALT 13  --   --  21 20  ALKPHOS 37*  --   --  43 45  BILITOT 0.8  --   --  0.6 0.7  ALBUMIN 2.9*  --   --  3.0* 3.0*  MG 2.0  --  2.0  --   --   CRP  --   --  0.6 0.6 1.1*  DDIMER  --   --  1.77* 0.96* 0.77*  PROCALCITON  --   --  <0.10 <0.10 <0.10  LATICACIDVEN 0.9  --   --   --   --   TSH 9.056*  --   --   --   --   AMMONIA 15  --   --   --   --   BNP 305.5*  --  504.0*  --   --     ------------------------------------------------------------------------------------------------------------------ No results for input(s): CHOL, HDL, LDLCALC, TRIG, CHOLHDL, LDLDIRECT in the last 72 hours.  Lab Results  Component Value Date   HGBA1C  06/11/2007    5.7 (NOTE)   The ADA recommends the following therapeutic goals for glycemic   control related to Hgb A1C measurement:   Goal of Therapy:   < 7.0% Hgb A1C   Action Suggested:  > 8.0% Hgb A1C   Ref:  Diabetes Care, 22, Suppl. 1, 1999   ------------------------------------------------------------------------------------------------------------------ Recent Labs    01/10/21 2152  TSH 9.056*    Cardiac Enzymes No results for input(s): CKMB, TROPONINI, MYOGLOBIN in the last 168 hours.  Invalid input(s): CK ------------------------------------------------------------------------------------------------------------------    Component Value Date/Time   BNP 504.0 (H) 01/11/2021 0921   BNP 379.4 (H)  02/25/2016 0951    Micro Results Recent Results (from the past 240 hour(s))  Blood culture (routine x 2)     Status: None (Preliminary result)   Collection Time: 01/10/21  9:52 PM   Specimen: BLOOD  Result Value Ref Range Status   Specimen Description BLOOD LEFT ARM  Final   Special Requests   Final    BOTTLES DRAWN AEROBIC AND ANAEROBIC Blood Culture adequate volume   Culture   Final    NO GROWTH 3 DAYS Performed at [facility], [address] [place], [place] [address]    Report Status PENDING  Incomplete  Resp Panel by RT-PCR (Flu A&B, Covid) Nasopharyngeal Swab     Status: Abnormal   Collection Time: 01/10/21 10:00 PM   Specimen: Nasopharyngeal Swab; Nasopharyngeal(NP) swabs in vial transport medium  Result Value Ref Range Status   SARS Coronavirus 2  by RT PCR POSITIVE (A) NEGATIVE Final    Comment: RESULT CALLED TO, READ BACK BY AND VERIFIED WITH: K. SETRAM,RN 0006 01/11/2021 T. TYSOR (NOTE) SARS-CoV-2 target nucleic acids are DETECTED.  The SARS-CoV-2 RNA is generally detectable in upper respiratory specimens during the acute phase of infection. Positive results are indicative of the presence of the identified virus, but do not rule out bacterial infection or co-infection with other pathogens not detected by the test. Clinical correlation with patient history and other diagnostic information is necessary to determine patient infection status. The expected result is Negative.  Fact Sheet for Patients: EntrepreneurPulse.com.au  Fact Sheet for Healthcare Providers: IncredibleEmployment.be  This test is not yet approved or cleared by the [place] FDA and  has been authorized for detection and/or diagnosis of SARS-CoV-2 by FDA under an Emergency Use Authorization (EUA).  This EUA will remain in effect (meaning this test ca n be used) for the duration of  the COVID-19 declaration under Section 564(b)(1) of the Act,  21 U.S.C. section 360bbb-3(b)(1), unless the authorization is terminated or revoked sooner.     Influenza A by PCR NEGATIVE NEGATIVE Final   Influenza B by PCR NEGATIVE NEGATIVE Final    Comment: (NOTE) The Xpert Xpress SARS-CoV-2/FLU/RSV plus assay is intended as an aid in the diagnosis of influenza from Nasopharyngeal swab specimens and should not be used as a sole basis for treatment. Nasal washings and aspirates are unacceptable for Xpert Xpress SARS-CoV-2/FLU/RSV testing.  Fact Sheet for Patients: EntrepreneurPulse.com.au  Fact Sheet for Healthcare Providers: IncredibleEmployment.be  This test is not yet approved or cleared by the [place] FDA and has been authorized for detection and/or diagnosis of SARS-CoV-2 by FDA under an Emergency Use Authorization (EUA). This EUA will remain in effect (meaning this test can be used) for the duration of the COVID-19 declaration under Section 564(b)(1) of the Act, 21 U.S.C. section 360bbb-3(b)(1), unless the authorization is terminated or revoked.  Performed at [facility], [place] [address], [place], [place] [address]   Blood culture (routine x 2)     Status: None (Preliminary result)   Collection Time: 01/10/21 11:45 PM   Specimen: BLOOD  Result Value Ref Range Status   Specimen Description BLOOD RIGHT HAND  Final   Special Requests   Final    BOTTLES DRAWN AEROBIC AND ANAEROBIC Blood Culture results may not be optimal due to an inadequate volume of blood received in culture bottles   Culture   Final    NO GROWTH 2 DAYS Performed at [facility], [place] [address] [place], [place] [address]    Report Status PENDING  Incomplete  MRSA PCR Screening     Status: None   Collection Time: 01/11/21  7:48 AM   Specimen: Nasal Mucosa; Nasopharyngeal  Result Value Ref Range Status   MRSA by PCR NEGATIVE NEGATIVE Final    Comment:        The GeneXpert MRSA Assay (FDA approved for NASAL  specimens only), is one component of a comprehensive MRSA colonization surveillance program. It is not intended to diagnose MRSA infection nor to guide or monitor treatment for MRSA infections. Performed at [facility], [place] [address], [place], [place] [address]     Radiology Reports CT Head Wo Contrast  Result Date: 01/10/2021 CLINICAL DATA:  Mental status changes of unknown cause. EXAM: CT HEAD WITHOUT CONTRAST TECHNIQUE: Contiguous axial images were obtained from the base of the skull through the vertex without  intravenous contrast. COMPARISON:  10/14/2013 FINDINGS: Brain: Age related generalized atrophy. Chronic small-vessel ischemic changes of the cerebral hemispheric white matter. No sign of acute infarction, mass lesion, hemorrhage, hydrocephalus or extra-axial collection. Vascular: There is atherosclerotic calcification of the major vessels at the base of the brain. Skull: Negative Sinuses/Orbits: Clear/normal Other: None IMPRESSION: No acute finding by CT. Age related atrophy and chronic small-vessel ischemic changes of the white matter. Electronically Signed   By: Nelson Chimes M.D.   On: 01/10/2021 21:14   CT Angio Chest PE W and/or Wo Contrast  Result Date: 01/10/2021 CLINICAL DATA:  PE suspected, high prob Weakness and lethargy. EXAM: CT ANGIOGRAPHY CHEST WITH CONTRAST TECHNIQUE: Multidetector CT imaging of the chest was performed using the standard protocol during bolus administration of intravenous contrast. Multiplanar CT image reconstructions and MIPs were obtained to evaluate the vascular anatomy. CONTRAST:  53mL OMNIPAQUE IOHEXOL 350 MG/ML SOLN COMPARISON:  Radiograph earlier today.  Chest CT 10/14/2013 FINDINGS: Cardiovascular: There are no filling defects within the pulmonary arteries to suggest pulmonary embolus. Left-sided pacemaker in place. Aortic atherosclerosis and tortuosity. Multi chamber cardiomegaly with contrast refluxing into the hepatic veins and IVC. No  pericardial effusion. Post CABG with native coronary artery calcification. Contrast refluxes into chest wall collaterals in the azygos system. Mediastinum/Nodes: Shotty mediastinal lymph nodes, all subcentimeter. No hilar adenopathy. Decompressed esophagus. No thyroid nodule. Lungs/Pleura: Small bilateral pleural effusions, right greater than left. Scattered reticular and ground-glass opacities throughout both lungs, likely representing scarring, many at site of prior nodular airspace disease. Small amount of fluid in the right major fissure, with questionable fissural nodularity. Trachea and central bronchi are patent. No pulmonary mass. Upper Abdomen: Assessed on concurrent abdominal CT, reported separately. Musculoskeletal: Scoliosis and ordinary degenerative change in the spine. There are no acute or suspicious osseous abnormalities. Review of the MIP images confirms the above findings. IMPRESSION: 1. No pulmonary embolus. 2. Multi chamber cardiomegaly with contrast refluxing into the hepatic veins and IVC consistent with elevated right heart pressures. Small bilateral pleural effusions, right greater than left. 3. Scattered reticular and ground-glass opacities throughout both lungs, likely representing scarring, many at site of prior nodular airspace disease. Pneumonia is not entirely excluded, recommend correlation for acute infective symptoms. Aortic Atherosclerosis (ICD10-I70.0). Electronically Signed   By: Keith Rake M.D.   On: 01/10/2021 23:45   CT ABDOMEN PELVIS W CONTRAST  Result Date: 01/10/2021 CLINICAL DATA:  Epigastric abdominal pain. Unexplained hypotension. Lethargy and weakness. EXAM: CT ABDOMEN AND PELVIS WITH CONTRAST TECHNIQUE: Multidetector CT imaging of the abdomen and pelvis was performed using the standard protocol following bolus administration of intravenous contrast. CONTRAST:  47mL OMNIPAQUE IOHEXOL 350 MG/ML SOLN COMPARISON:  CT 419 FINDINGS: Lower chest: Small pleural  effusions and multi chamber cardiomegaly. Detailed assessment on concurrent chest CT, reported separately. Hepatobiliary: No focal liver abnormality. Post cholecystectomy. There is chronic biliary dilatation with mid common bile duct measuring 15 mm. This appears similar to prior exam. There is central intrahepatic biliary ductal dilatation. No evidence of choledocholithiasis. Pancreas: Parenchymal atrophy. No ductal dilatation or inflammation. Spleen: Normal in size without focal abnormality. Surgical clips at the hilum. Adrenals/Urinary Tract: Stable left adrenal thickening. Normal right adrenal gland. No suspicious nodule. No hydronephrosis or perinephric edema. Small cysts in the upper right kidney, as well as low-density lesions too small to characterize. Partially exophytic enhancing lesion from the posterior left kidney measures 14 x 8 mm, previously 13 x 11 mm. Absent renal excretion on delayed phase imaging. Urinary  bladder is distended. No bladder wall thickening. Stomach/Bowel: Decompressed stomach. Few fluid-filled bowel small bowel loops in the lower abdomen. Mild hyperemia of a short segment of small bowel in the central abdomen, series 3, image 40. No obstruction. Presumed prior right hemicolectomy with enteric anastomosis in the right upper quadrant. Moderate stool in the distal colon. Surgical clips adjacent to the rectosigmoid. Vascular/Lymphatic: Aortic atherosclerosis and tortuosity. No aortic aneurysm. No acute vascular findings. Patent portal vein. No enlarged lymph nodes in the abdomen or pelvis. Reproductive: Prominent prostate gland. Other: Small amount of ascites in the right and left upper quadrant. No free air or focal fluid collection. Musculoskeletal: Scoliosis and degenerative change throughout the spine. There are no acute or suspicious osseous abnormalities. IMPRESSION: 1. Mild hyperemia of a short segment of small bowel in the central abdomen, suspicious for enteritis. No  obstruction. 2. Small amount of ascites in the right and left upper quadrant. 3. Absent renal excretion on delayed phase imaging suggest underlying renal dysfunction. 4. Chronic biliary dilatation, unchanged from prior exam, likely related to prior cholecystectomy. Recommend correlation with LFTs. 5. Partially exophytic enhancing lesion from the posterior left kidney measures 14 x 8 mm, previously 13 x 11 mm. This remains concerning for incidental renal neoplasm, however the lack of significant change in size over the course of 2 years suggests this is indolent. Should further workup be pursued, recommend MRI for characterization if patient is able to tolerate breath hold technique, giving consideration for density age. Aortic Atherosclerosis (ICD10-I70.0). Electronically Signed   By: Keith Rake M.D.   On: 01/10/2021 23:53   DG Chest Portable 1 View  Result Date: 01/10/2021 CLINICAL DATA:  Altered mental status EXAM: PORTABLE CHEST 1 VIEW COMPARISON:  December 13, 2020 FINDINGS: There is a dual chamber pacemaker/ICD in place. The heart size is enlarged. There is a small left-sided pleural effusion. There are chronic airspace opacities bilaterally. No pneumothorax. Atelectasis versus scarring is noted at the left lung base. There is no definite acute osseous abnormality. IMPRESSION: 1. Cardiomegaly with small left pleural effusion. 2. Scattered chronic airspace opacities bilaterally favored to represent scarring or atelectasis. Electronically Signed   By: Constance Holster M.D.   On: 01/10/2021 20:47   DG Abd Portable 1V  Result Date: 01/11/2021 CLINICAL DATA:  Nausea and vomiting in a patient who is COVID positive. EXAM: PORTABLE ABDOMEN - 1 VIEW COMPARISON:  CT chest, abdomen and pelvis yesterday. FINDINGS: The bowel gas pattern is normal. No radio-opaque calculi or other significant radiographic abnormality are seen. Contrast in the urinary bladder from the patient's CT scan is noted. There is marked  convex left scoliosis and multilevel degenerative change. Surgical clips there is scattered in the abdomen and pelvis. IMPRESSION: No acute abnormality. Electronically Signed   By: Inge Rise M.D.   On: 01/11/2021 11:16   CUP PACEART INCLINIC DEVICE CHECK  Result Date: 12/25/2020 Pacemaker check in clinic. Normal device function. Thresholds, sensing, impedances consistent with previous measurements. Device programmed to maximize longevity. No new high ventricular rates noted. Device programmed at appropriate safety margins. Histogram distribution appropriate for patient activity level. Device programmed to optimize intrinsic conduction. Estimated longevity _11 years__. Patient enrolled in remote follow-up

## 2021-01-13 NOTE — Progress Notes (Signed)
Manufacturing engineer [facility]) Hospital Liaison Note:  Spoke with family to listen and offer support this afternoon.   Patient daughter confirms that the [facility] ordered DME will be delivered in the morning betweeen 10:00am and noon.  An [facility] HLT will follow this patient daily while hospitalized to support as needed.  Thank you for allowing [place] to participate in this patient's care,  Gar Ponto, RN [facility] HLT [phone]

## 2021-01-14 DIAGNOSIS — Z515 Encounter for palliative care: Secondary | ICD-10-CM | POA: Diagnosis not present

## 2021-01-14 DIAGNOSIS — Z66 Do not resuscitate: Secondary | ICD-10-CM

## 2021-01-14 NOTE — Care Management Important Message (Signed)
Important Message  Patient Details  Name: [name] [name] MRN: [id number] Date of Birth: August 30, 1932   Medicare Important Message Given:  Yes - Important Message mailed due to current National Emergency  Verbal consent obtained due to current National Emergency  Relationship to patient: Self Contact Name: Forbes Call Date: 01/14/21  Time: 1517 Phone: [phone] Outcome: No Answer/Busy Important Message mailed to: Patient address on file    [name] 01/14/2021, 3:17 PM

## 2021-01-14 NOTE — Progress Notes (Signed)
Physical Therapy Treatment Patient Details Name: [name] [name] MRN: [id number] DOB: [birth year] Today's Date: 01/14/2021    History of Present Illness Pt is an 87 y.o. male admitted from [facility] on 01/10/21 with hypotension; workup for gastroenteritis and dehydration, COVID-19. PMH includes PAF s/p pacemaker, cardiomyopathy s/p ICD, CKD III, anemia, colon CA. Of note, recent admission 09/2020 with COVID, then 11/2020 with VT and d/c to SNF.   PT Comments    Pt reports increased grogginess after receiving medicine earlier. Pt requiring increased assist this session and unaware of bowel incontinence. Pt requires mod-maxA+2 to stand and take pivotal steps; dependent for posterior pericare. With with very poor activity tolerance, quick to fatigue and require seated rest after 5-10-sec of standing activity. Pt pleasant and motivated for return home. Noted plans for daughter to transport pt home in private vehicle at d/c tomorrow - strongly recommend ambulance transport home (social worker notified).     Follow Up Recommendations  Supervision/Assistance - 24 hour (home w/ hospice)     Equipment Recommendations  Wheelchair (measurements PT);Wheelchair cushion (measurements PT);3in1 (PT);Hospital bed (ambulance transport home)    Recommendations for Other Services       Precautions / Restrictions Precautions Precautions: Fall;Other (comment) Precaution Comments: Bowel incontinence; quick to 'give out' and need to sit Restrictions Weight Bearing Restrictions: No    Mobility  Bed Mobility Overal bed mobility: Needs Assistance Bed Mobility: Supine to Sit     Supine to sit: Mod assist;HOB elevated     General bed mobility comments: ModA for HHA to assist trunk elevation and scooting hips to EOB; increased time and effort    Transfers Overall transfer level: Needs assistance Equipment used: Rolling walker (2 wheeled) Transfers: Sit to/from [facility] Sit  to Stand: Mod assist;Max assist;+2 safety/equipment Stand pivot transfers: Max assist;+2 physical assistance       General transfer comment: Fluctuates from mod-maxA+2 to assist trunk elevation, difficulty achieving fully upright posture; assist to stabilize RW; multiple sit<>stands from EOB and recliner to RW; pt quick to fatigue requiring maxA for eccentric control into sitting  Ambulation/Gait                 Stairs             Wheelchair Mobility    Modified Rankin (Stroke Patients Only)       Balance Overall balance assessment: Needs assistance Sitting-balance support: No upper extremity supported;Feet supported Sitting balance-Leahy Scale: Fair Sitting balance - Comments: Able to sit for brief bouts with no UE support, min guard; quick to fatigue with L lateral and posterior lean onto elbow, or forward lean onto walker   Standing balance support: Bilateral upper extremity supported;During functional activity Standing balance-Leahy Scale: Poor Standing balance comment: Reliant on UE support and external assist; dependent for pericare                            Cognition Arousal/Alertness: Awake/alert Behavior During Therapy: WFL for tasks assessed/performed Overall Cognitive Status: No family/caregiver present to determine baseline cognitive functioning Area of Impairment: Orientation;Attention;Memory;Safety/judgement;Awareness;Problem solving;Following commands                   Current Attention Level: Sustained;Selective Memory: Decreased short-term memory Following Commands: Follows one step commands with increased time Safety/Judgement: Decreased awareness of safety;Decreased awareness of deficits Awareness: Intellectual;Emergent Problem Solving: Slow processing;Difficulty sequencing General Comments: Pt endorses increased "groggy" feeling after taking medicine earlier. Pt  able to state he could not leave yet because hospital bed  needed to bed delivered. Repetitive with information. Responds well to music. Unaware of bowel incontinence      Exercises      General Comments General comments (skin integrity, edema, etc.): SpO2 96% on RA, HR 90s-100s. Pt reports, "Everytime I take a step, I take a chance" - regarding fall risk and bruising on BLEs      Pertinent Vitals/Pain Pain Assessment: Faces Faces Pain Scale: Hurts a little bit Pain Location: Generalized Pain Descriptors / Indicators: Tiring Pain Intervention(s): Monitored during session;Limited activity within patient's tolerance    Home Living                      Prior Function            PT Goals (current goals can now be found in the care plan section) Progress towards PT goals: Progressing toward goals (slowly; increased fatigue)    Frequency    Min 3X/week      PT Plan Current plan remains appropriate    Co-evaluation PT/OT/SLP Co-Evaluation/Treatment: Yes Reason for Co-Treatment: Necessary to address cognition/behavior during functional activity;For patient/therapist safety;To address functional/ADL transfers PT goals addressed during session: Mobility/safety with mobility;Balance;Proper use of DME        AM-PAC PT "6 Clicks" Mobility   Outcome Measure  Help needed turning from your back to your side while in a flat bed without using bedrails?: A Lot Help needed moving from lying on your back to sitting on the side of a flat bed without using bedrails?: A Lot Help needed moving to and from a bed to a chair (including a wheelchair)?: A Lot Help needed standing up from a chair using your arms (e.g., wheelchair or bedside chair)?: A Lot Help needed to walk in hospital room?: A Lot Help needed climbing 3-5 steps with a railing? : Total 6 Click Score: 11    End of Session Equipment Utilized During Treatment: Gait belt Activity Tolerance: Patient tolerated treatment well;Patient limited by fatigue Patient left: in  chair;with call bell/phone within reach;with chair alarm set Nurse Communication: Mobility status PT Visit Diagnosis: Difficulty in walking, not elsewhere classified (R26.2);Muscle weakness (generalized) (M62.81)     Time: 3888-2800 PT Time Calculation (min) (ACUTE ONLY): 25 min  Charges:  $Therapeutic Activity: 8-22 mins                     Mabeline Caras, PT, DPT Acute Rehabilitation Services  Pager [phone] Office [place] 01/14/2021, 2:16 PM

## 2021-01-14 NOTE — Progress Notes (Signed)
PROGRESS NOTE                                                                                                                                                                                                             Patient Demographics:    [name] [name], is a 87 y.o. male, DOB - 1932/04/18, VCB:[id number]  Outpatient Primary MD for the patient is [name] Infante, MD    LOS - 3  Admit date - 01/10/2021    Chief Complaint  Patient presents with  . Dehydration  . Hypotension       Brief Narrative (HPI from H&P)  - [name] [name] is a 87 y.o. male with history of proximal atrial fibrillation status post pacemaker placement, cardiomyopathy status post ICD placement last EF was around 35 to 40%, chronic kidney disease stage III, chronic anemia history of colon cancer admitted November for Covid infection and admitted in January last month for VT at that time was placed on amiodarone presently in rehab was found to be lethargic and hypotensive with diarrhea.  Was admitted for COVID-19 infection with gastroenteritis and dehydration.   Subjective:   Patient in bed, appears comfortable, denies any headache, no fever, no chest pain or pressure, no shortness of breath , no abdominal pain. No focal weakness.   Assessment  & Plan :     1. Acute COVID-19 pneumonia in a patient who has taken 2 shots of vaccine so far -thankfully most of his symptoms are of toxic encephalopathy, no hypoxia.  Hydrated and monitor.  Encouraged the patient to sit up in chair in the daytime use I-S and flutter valve for pulmonary toiletry and then prone in bed when at night.  Will advance activity and titrate down oxygen as possible.    SpO2: 93 %  Recent Labs  Lab 01/10/21 2152 01/10/21 2200 01/10/21 2201 01/11/21 0921 01/12/21 0400 01/13/21 0141  WBC 6.9  --   --   --  6.7 7.2  HGB 8.0*  --  8.8*  --  8.8* 8.6*  HCT 26.8*  --  26.0*  --  29.6*  26.6*  PLT 231  --   --   --  249 201  CRP  --   --   --  0.6 0.6 1.1*  BNP 305.5*  --   --  504.0*  --   --  DDIMER  --   --   --  1.77* 0.96* 0.77*  PROCALCITON  --   --   --  <0.10 <0.10 <0.10  AST 18  --   --   --  25 23  ALT 13  --   --   --  21 20  ALKPHOS 37*  --   --   --  43 45  BILITOT 0.8  --   --   --  0.6 0.7  ALBUMIN 2.9*  --   --   --  3.0* 3.0*  LATICACIDVEN 0.9  --   --   --   --   --   SARSCOV2NAA  --  POSITIVE*  --   --   --   --     2.  Dehydration with hypotension.  Post IV fluids and monitor.  3.  Weakness and deconditioning.  PT OT, daughter now wants to take him home with palliative care/hospice, goal of care is comfort, DC on Friday.  4.  BPH.  On Flomax.  5.  History of paroxysmal A. fib. For now amiodarone, holding beta-blocker and ACE inhibitor due to hypotension and dehydration.  [name] of greater than 3 but on no anticoagulation due to high fall risk, stopped heparin drip, low-dose aspirin, appreciate cardiology input, family wants to focus more on comfort.  6.  COVID-19 induced gastroenteritis.  Supportive care.  No GI complaints.  Nonspecific CT findings, outpatient age-appropriate follow-up by PCP.  Per son no heroics no surgery desired.  7.  Chronic systolic heart failure last EF around 30%.  Dehydrated, improving after gentle IV fluids.  Resume beta-blocker, ACE once blood pressure better.  8.  AKI on CKD 4.  Baseline creatinine 1.7.  Improved after hydration continue to hold ACE as he is still hypotensive.       Condition - Fair  Family Communication  : [name] [name] [phone] on 01/11/2021 in detail, daughter [name] [name] [phone] on 01/13/2020  Code Status :  DNR  Consults  :  Cards   Procedures  :     CTA chest.  No PE bilateral groundglass opacities.    CT head.  Nonacute   CT - 1. Mild hyperemia of a short segment of small bowel in the central abdomen, suspicious for enteritis. No obstruction. 2. Small amount of ascites in  the right and left upper quadrant. 3. Absent renal excretion on delayed phase imaging suggest underlying renal dysfunction. 4. Chronic biliary dilatation, unchanged from prior exam, likely related to prior cholecystectomy. Recommend correlation with LFTs. 5. Partially exophytic enhancing lesion from the posterior left kidney measures 14 x 8 mm, previously 13 x 11 mm. This remains concerning for incidental renal neoplasm, however the lack of significant change in size over the course of 2 years suggests this is indolent. Should further workup be pursued, recommend MRI for characterization if patient is able to tolerate breath hold technique, giving consideration for density age. Aortic Atherosclerosis       PUD Prophylaxis : PPI  Disposition Plan  :    Status is: Inpatient  Remains inpatient appropriate because:IV treatments appropriate due to intensity of illness or inability to take PO   Dispo: The patient is from: SNF              Anticipated d/c is to: SNF              Anticipated d/c date is: 3 days  Patient currently is not medically stable to d/c.   Difficult to place patient No   DVT Prophylaxis  :   Heparin   Lab Results  Component Value Date   PLT 201 01/13/2021    Diet :  Diet Order            Diet Heart Room service appropriate? Yes; Fluid consistency: Thin; Fluid restriction: 1200 mL Fluid  Diet effective now                  Inpatient Medications  Scheduled Meds: . amiodarone  200 mg Oral Daily  . vitamin C  1,000 mg Oral TID  . gatifloxacin  1 drop Both Eyes QID  . heparin injection (subcutaneous)  5,000 Units Subcutaneous Q8H  . pantoprazole  40 mg Oral QHS  . tamsulosin  0.4 mg Oral Daily  . zinc sulfate  220 mg Oral Daily   Continuous Infusions: . remdesivir 100 mg in NS 100 mL 100 mg (01/14/21 0920)   PRN Meds:.haloperidol lactate, nitroGLYCERIN, ondansetron (ZOFRAN) IV  Antibiotics  :    Anti-infectives (From admission, onward)    Start     Dose/Rate Route Frequency Ordered Stop   01/12/21 1000  remdesivir 100 mg in sodium chloride 0.9 % 100 mL IVPB       "Followed by" Linked Group Details   100 mg 200 mL/hr over 30 Minutes Intravenous Daily 01/11/21 0056 01/16/21 0959   01/11/21 0200  remdesivir 200 mg in sodium chloride 0.9% 250 mL IVPB       "Followed by" Linked Group Details   200 mg 580 mL/hr over 30 Minutes Intravenous Once 01/11/21 0056 01/11/21 0700       Time Spent in minutes  30   Lala Lund M.D on 01/14/2021 at 10:47 AM  To page go to [email or web address]   [facility] -  Office  [phone]   See all Orders from today for further details    Objective:   Vitals:   01/13/21 2312 01/13/21 2356 01/14/21 0450 01/14/21 0915  BP: 92/60 93/66 96/64    Pulse: 96 97 92   Resp: (!) 22 18 17    Temp: 98 F (36.7 C) 97.6 F (36.4 C) 97.8 F (36.6 C)   TempSrc: Axillary Axillary Axillary   SpO2: 99% 99% 100% 93%  Weight:      Height:        Wt Readings from Last 3 Encounters:  01/11/21 57.6 kg  12/25/20 57.2 kg  12/17/20 59.7 kg     Intake/Output Summary (Last 24 hours) at 01/14/2021 1047 Last data filed at 01/13/2021 2100 Gross per 24 hour  Intake -  Output 850 ml  Net -850 ml     Physical Exam  Awake, mildly confused, No new F.N deficits,   [place].AT,PERRAL Supple Neck,No JVD, No cervical lymphadenopathy appriciated.  Symmetrical Chest wall movement, Good air movement bilaterally, CTAB RRR,No Gallops, Rubs or new Murmurs, No Parasternal Heave +ve B.Sounds, Abd Soft, No tenderness, No organomegaly appriciated, No rebound - guarding or rigidity. No Cyanosis, Clubbing or edema, No new Rash or bruise    Data Review:    CBC Recent Labs  Lab 01/10/21 2152 01/10/21 2201 01/12/21 0400 01/13/21 0141  WBC 6.9  --  6.7 7.2  HGB 8.0* 8.8* 8.8* 8.6*  HCT 26.8* 26.0* 29.6* 26.6*  PLT 231  --  249 201  MCV 90.8  --  90.2 88.4  MCH 27.1  --  26.8 28.6  MCHC 29.9*  --  29.7* 32.3   RDW 16.5*  --  16.5* 16.7*  LYMPHSABS 0.6*  --  0.4* 0.4*  MONOABS 0.5  --  0.5 0.6  EOSABS 0.1  --  0.0 0.0  BASOSABS 0.0  --  0.0 0.0    Recent Labs  Lab 01/10/21 2152 01/10/21 2201 01/11/21 0921 01/12/21 0400 01/13/21 0141  NA 135 138 135 136 136  K 4.1 4.2 4.2 4.4 4.4  CL 103  --  103 105 105  CO2 24  --  19* 21* 22  GLUCOSE 102*  --  145* 112* 98  BUN 37*  --  35* 39* 44*  CREATININE 1.79*  --  1.61* 1.84* 1.80*  CALCIUM 8.3*  --  8.6* 8.9 8.8*  AST 18  --   --  25 23  ALT 13  --   --  21 20  ALKPHOS 37*  --   --  43 45  BILITOT 0.8  --   --  0.6 0.7  ALBUMIN 2.9*  --   --  3.0* 3.0*  MG 2.0  --  2.0  --   --   CRP  --   --  0.6 0.6 1.1*  DDIMER  --   --  1.77* 0.96* 0.77*  PROCALCITON  --   --  <0.10 <0.10 <0.10  LATICACIDVEN 0.9  --   --   --   --   TSH 9.056*  --   --   --   --   AMMONIA 15  --   --   --   --   BNP 305.5*  --  504.0*  --   --     ------------------------------------------------------------------------------------------------------------------ No results for input(s): CHOL, HDL, LDLCALC, TRIG, CHOLHDL, LDLDIRECT in the last 72 hours.  Lab Results  Component Value Date   HGBA1C  06/11/2007    5.7 (NOTE)   The ADA recommends the following therapeutic goals for glycemic   control related to Hgb A1C measurement:   Goal of Therapy:   < 7.0% Hgb A1C   Action Suggested:  > 8.0% Hgb A1C   Ref:  Diabetes Care, 22, Suppl. 1, 1999   ------------------------------------------------------------------------------------------------------------------ No results for input(s): TSH, T4TOTAL, T3FREE, THYROIDAB in the last 72 hours.  Invalid input(s): FREET3  Cardiac Enzymes No results for input(s): CKMB, TROPONINI, MYOGLOBIN in the last 168 hours.  Invalid input(s): CK ------------------------------------------------------------------------------------------------------------------    Component Value Date/Time   BNP 504.0 (H) 01/11/2021 0921   BNP  379.4 (H) 02/25/2016 0951    Micro Results Recent Results (from the past 240 hour(s))  Blood culture (routine x 2)     Status: None (Preliminary result)   Collection Time: 01/10/21  9:52 PM   Specimen: BLOOD  Result Value Ref Range Status   Specimen Description BLOOD LEFT ARM  Final   Special Requests   Final    BOTTLES DRAWN AEROBIC AND ANAEROBIC Blood Culture adequate volume   Culture   Final    NO GROWTH 4 DAYS Performed at [facility], [address], [place], [place] [address]    Report Status PENDING  Incomplete  Resp Panel by RT-PCR (Flu A&B, Covid) Nasopharyngeal Swab     Status: Abnormal   Collection Time: 01/10/21 10:00 PM   Specimen: Nasopharyngeal Swab; Nasopharyngeal(NP) swabs in vial transport medium  Result Value Ref Range Status   SARS Coronavirus 2 by RT PCR POSITIVE (A) NEGATIVE Final    Comment: RESULT CALLED TO, READ BACK  BY AND VERIFIED WITH: K. SETRAM,RN 0006 01/11/2021 T. TYSOR (NOTE) SARS-CoV-2 target nucleic acids are DETECTED.  The SARS-CoV-2 RNA is generally detectable in upper respiratory specimens during the acute phase of infection. Positive results are indicative of the presence of the identified virus, but do not rule out bacterial infection or co-infection with other pathogens not detected by the test. Clinical correlation with patient history and other diagnostic information is necessary to determine patient infection status. The expected result is Negative.  Fact Sheet for Patients: EntrepreneurPulse.com.au  Fact Sheet for Healthcare Providers: IncredibleEmployment.be  This test is not yet approved or cleared by the [place] FDA and  has been authorized for detection and/or diagnosis of SARS-CoV-2 by FDA under an Emergency Use Authorization (EUA).  This EUA will remain in effect (meaning this test ca n be used) for the duration of  the COVID-19 declaration under Section 564(b)(1) of the Act,  21 U.S.C. section 360bbb-3(b)(1), unless the authorization is terminated or revoked sooner.     Influenza A by PCR NEGATIVE NEGATIVE Final   Influenza B by PCR NEGATIVE NEGATIVE Final    Comment: (NOTE) The Xpert Xpress SARS-CoV-2/FLU/RSV plus assay is intended as an aid in the diagnosis of influenza from Nasopharyngeal swab specimens and should not be used as a sole basis for treatment. Nasal washings and aspirates are unacceptable for Xpert Xpress SARS-CoV-2/FLU/RSV testing.  Fact Sheet for Patients: EntrepreneurPulse.com.au  Fact Sheet for Healthcare Providers: IncredibleEmployment.be  This test is not yet approved or cleared by the [place] FDA and has been authorized for detection and/or diagnosis of SARS-CoV-2 by FDA under an Emergency Use Authorization (EUA). This EUA will remain in effect (meaning this test can be used) for the duration of the COVID-19 declaration under Section 564(b)(1) of the Act, 21 U.S.C. section 360bbb-3(b)(1), unless the authorization is terminated or revoked.  Performed at [facility], [place] [address] [place], [place] [address]   Blood culture (routine x 2)     Status: None (Preliminary result)   Collection Time: 01/10/21 11:45 PM   Specimen: BLOOD  Result Value Ref Range Status   Specimen Description BLOOD RIGHT HAND  Final   Special Requests   Final    BOTTLES DRAWN AEROBIC AND ANAEROBIC Blood Culture results may not be optimal due to an inadequate volume of blood received in culture bottles   Culture   Final    NO GROWTH 3 DAYS Performed at [facility], [place] [address] [place], [place] [address]    Report Status PENDING  Incomplete  MRSA PCR Screening     Status: None   Collection Time: 01/11/21  7:48 AM   Specimen: Nasal Mucosa; Nasopharyngeal  Result Value Ref Range Status   MRSA by PCR NEGATIVE NEGATIVE Final    Comment:        The GeneXpert MRSA Assay (FDA approved for NASAL  specimens only), is one component of a comprehensive MRSA colonization surveillance program. It is not intended to diagnose MRSA infection nor to guide or monitor treatment for MRSA infections. Performed at [facility], [place] [address], [place], [place] [address]     Radiology Reports CT Head Wo Contrast  Result Date: 01/10/2021 CLINICAL DATA:  Mental status changes of unknown cause. EXAM: CT HEAD WITHOUT CONTRAST TECHNIQUE: Contiguous axial images were obtained from the base of the skull through the vertex without intravenous contrast. COMPARISON:  10/14/2013 FINDINGS: Brain: Age related generalized atrophy. Chronic small-vessel ischemic changes of  the cerebral hemispheric white matter. No sign of acute infarction, mass lesion, hemorrhage, hydrocephalus or extra-axial collection. Vascular: There is atherosclerotic calcification of the major vessels at the base of the brain. Skull: Negative Sinuses/Orbits: Clear/normal Other: None IMPRESSION: No acute finding by CT. Age related atrophy and chronic small-vessel ischemic changes of the white matter. Electronically Signed   By: Nelson Chimes M.D.   On: 01/10/2021 21:14   CT Angio Chest PE W and/or Wo Contrast  Result Date: 01/10/2021 CLINICAL DATA:  PE suspected, high prob Weakness and lethargy. EXAM: CT ANGIOGRAPHY CHEST WITH CONTRAST TECHNIQUE: Multidetector CT imaging of the chest was performed using the standard protocol during bolus administration of intravenous contrast. Multiplanar CT image reconstructions and MIPs were obtained to evaluate the vascular anatomy. CONTRAST:  41mL OMNIPAQUE IOHEXOL 350 MG/ML SOLN COMPARISON:  Radiograph earlier today.  Chest CT 10/14/2013 FINDINGS: Cardiovascular: There are no filling defects within the pulmonary arteries to suggest pulmonary embolus. Left-sided pacemaker in place. Aortic atherosclerosis and tortuosity. Multi chamber cardiomegaly with contrast refluxing into the hepatic veins and IVC. No  pericardial effusion. Post CABG with native coronary artery calcification. Contrast refluxes into chest wall collaterals in the azygos system. Mediastinum/Nodes: Shotty mediastinal lymph nodes, all subcentimeter. No hilar adenopathy. Decompressed esophagus. No thyroid nodule. Lungs/Pleura: Small bilateral pleural effusions, right greater than left. Scattered reticular and ground-glass opacities throughout both lungs, likely representing scarring, many at site of prior nodular airspace disease. Small amount of fluid in the right major fissure, with questionable fissural nodularity. Trachea and central bronchi are patent. No pulmonary mass. Upper Abdomen: Assessed on concurrent abdominal CT, reported separately. Musculoskeletal: Scoliosis and ordinary degenerative change in the spine. There are no acute or suspicious osseous abnormalities. Review of the MIP images confirms the above findings. IMPRESSION: 1. No pulmonary embolus. 2. Multi chamber cardiomegaly with contrast refluxing into the hepatic veins and IVC consistent with elevated right heart pressures. Small bilateral pleural effusions, right greater than left. 3. Scattered reticular and ground-glass opacities throughout both lungs, likely representing scarring, many at site of prior nodular airspace disease. Pneumonia is not entirely excluded, recommend correlation for acute infective symptoms. Aortic Atherosclerosis (ICD10-I70.0). Electronically Signed   By: Keith Rake M.D.   On: 01/10/2021 23:45   CT ABDOMEN PELVIS W CONTRAST  Result Date: 01/10/2021 CLINICAL DATA:  Epigastric abdominal pain. Unexplained hypotension. Lethargy and weakness. EXAM: CT ABDOMEN AND PELVIS WITH CONTRAST TECHNIQUE: Multidetector CT imaging of the abdomen and pelvis was performed using the standard protocol following bolus administration of intravenous contrast. CONTRAST:  103mL OMNIPAQUE IOHEXOL 350 MG/ML SOLN COMPARISON:  CT 419 FINDINGS: Lower chest: Small pleural  effusions and multi chamber cardiomegaly. Detailed assessment on concurrent chest CT, reported separately. Hepatobiliary: No focal liver abnormality. Post cholecystectomy. There is chronic biliary dilatation with mid common bile duct measuring 15 mm. This appears similar to prior exam. There is central intrahepatic biliary ductal dilatation. No evidence of choledocholithiasis. Pancreas: Parenchymal atrophy. No ductal dilatation or inflammation. Spleen: Normal in size without focal abnormality. Surgical clips at the hilum. Adrenals/Urinary Tract: Stable left adrenal thickening. Normal right adrenal gland. No suspicious nodule. No hydronephrosis or perinephric edema. Small cysts in the upper right kidney, as well as low-density lesions too small to characterize. Partially exophytic enhancing lesion from the posterior left kidney measures 14 x 8 mm, previously 13 x 11 mm. Absent renal excretion on delayed phase imaging. Urinary bladder is distended. No bladder wall thickening. Stomach/Bowel: Decompressed stomach. Few fluid-filled bowel small bowel loops  in the lower abdomen. Mild hyperemia of a short segment of small bowel in the central abdomen, series 3, image 40. No obstruction. Presumed prior right hemicolectomy with enteric anastomosis in the right upper quadrant. Moderate stool in the distal colon. Surgical clips adjacent to the rectosigmoid. Vascular/Lymphatic: Aortic atherosclerosis and tortuosity. No aortic aneurysm. No acute vascular findings. Patent portal vein. No enlarged lymph nodes in the abdomen or pelvis. Reproductive: Prominent prostate gland. Other: Small amount of ascites in the right and left upper quadrant. No free air or focal fluid collection. Musculoskeletal: Scoliosis and degenerative change throughout the spine. There are no acute or suspicious osseous abnormalities. IMPRESSION: 1. Mild hyperemia of a short segment of small bowel in the central abdomen, suspicious for enteritis. No  obstruction. 2. Small amount of ascites in the right and left upper quadrant. 3. Absent renal excretion on delayed phase imaging suggest underlying renal dysfunction. 4. Chronic biliary dilatation, unchanged from prior exam, likely related to prior cholecystectomy. Recommend correlation with LFTs. 5. Partially exophytic enhancing lesion from the posterior left kidney measures 14 x 8 mm, previously 13 x 11 mm. This remains concerning for incidental renal neoplasm, however the lack of significant change in size over the course of 2 years suggests this is indolent. Should further workup be pursued, recommend MRI for characterization if patient is able to tolerate breath hold technique, giving consideration for density age. Aortic Atherosclerosis (ICD10-I70.0). Electronically Signed   By: Keith Rake M.D.   On: 01/10/2021 23:53   DG Chest Portable 1 View  Result Date: 01/10/2021 CLINICAL DATA:  Altered mental status EXAM: PORTABLE CHEST 1 VIEW COMPARISON:  December 13, 2020 FINDINGS: There is a dual chamber pacemaker/ICD in place. The heart size is enlarged. There is a small left-sided pleural effusion. There are chronic airspace opacities bilaterally. No pneumothorax. Atelectasis versus scarring is noted at the left lung base. There is no definite acute osseous abnormality. IMPRESSION: 1. Cardiomegaly with small left pleural effusion. 2. Scattered chronic airspace opacities bilaterally favored to represent scarring or atelectasis. Electronically Signed   By: Constance Holster M.D.   On: 01/10/2021 20:47   DG Abd Portable 1V  Result Date: 01/11/2021 CLINICAL DATA:  Nausea and vomiting in a patient who is COVID positive. EXAM: PORTABLE ABDOMEN - 1 VIEW COMPARISON:  CT chest, abdomen and pelvis yesterday. FINDINGS: The bowel gas pattern is normal. No radio-opaque calculi or other significant radiographic abnormality are seen. Contrast in the urinary bladder from the patient's CT scan is noted. There is marked  convex left scoliosis and multilevel degenerative change. Surgical clips there is scattered in the abdomen and pelvis. IMPRESSION: No acute abnormality. Electronically Signed   By: Inge Rise M.D.   On: 01/11/2021 11:16   CUP PACEART INCLINIC DEVICE CHECK  Result Date: 12/25/2020 Pacemaker check in clinic. Normal device function. Thresholds, sensing, impedances consistent with previous measurements. Device programmed to maximize longevity. No new high ventricular rates noted. Device programmed at appropriate safety margins. Histogram distribution appropriate for patient activity level. Device programmed to optimize intrinsic conduction. Estimated longevity _11 years__. Patient enrolled in remote follow-up

## 2021-01-14 NOTE — Plan of Care (Signed)
  Problem: Education: ?Goal: Knowledge of risk factors and measures for prevention of condition will improve ?Outcome: Progressing ?  ?Problem: Coping: ?Goal: Psychosocial and spiritual needs will be supported ?Outcome: Progressing ?  ?Problem: Respiratory: ?Goal: Will maintain a patent airway ?Outcome: Progressing ?Goal: Complications related to the disease process, condition or treatment will be avoided or minimized ?Outcome: Progressing ?  ?Problem: Education: ?Goal: Knowledge of General Education information will improve ?Description: Including pain rating scale, medication(s)/side effects and non-pharmacologic comfort measures ?Outcome: Progressing ?  ?Problem: Health Behavior/Discharge Planning: ?Goal: Ability to manage health-related needs will improve ?Outcome: Progressing ?  ?Problem: Clinical Measurements: ?Goal: Ability to maintain clinical measurements within normal limits will improve ?Outcome: Progressing ?Goal: Will remain free from infection ?Outcome: Progressing ?Goal: Diagnostic test results will improve ?Outcome: Progressing ?Goal: Respiratory complications will improve ?Outcome: Progressing ?Goal: Cardiovascular complication will be avoided ?Outcome: Progressing ?  ?Problem: Activity: ?Goal: Risk for activity intolerance will decrease ?Outcome: Progressing ?  ?Problem: Nutrition: ?Goal: Adequate nutrition will be maintained ?Outcome: Progressing ?  ?Problem: Coping: ?Goal: Level of anxiety will decrease ?Outcome: Progressing ?  ?Problem: Elimination: ?Goal: Will not experience complications related to bowel motility ?Outcome: Progressing ?Goal: Will not experience complications related to urinary retention ?Outcome: Progressing ?  ?Problem: Pain Managment: ?Goal: General experience of comfort will improve ?Outcome: Progressing ?  ?Problem: Safety: ?Goal: Ability to remain free from injury will improve ?Outcome: Progressing ?  ?Problem: Skin Integrity: ?Goal: Risk for impaired skin integrity will  decrease ?Outcome: Progressing ?  ?Problem: Safety: ?Goal: Non-violent Restraint(s) ?Outcome: Progressing ?  ?

## 2021-01-14 NOTE — Progress Notes (Addendum)
Manufacturing engineer [facility]) Hospital Liaison: RN note     DME delivery has been confirmed. Daughter, [name] states she wants to take the patient home by car. We Tentatively have his hospice admission scheduled for 2 pm tomorrow.   [facility] aware of the above. Please notify [facility] when patient is ready to leave the unit at discharge. (Call [phone] or [phone] after 5pm.)       A Please do not hesitate to call with questions.     Thank you,    Farrel Gordon, RN, [place] (listed on [facility] under [place])     [phone]

## 2021-01-14 NOTE — Progress Notes (Signed)
Occupational Therapy Treatment Patient Details Name: [name] [name] MRN: [id number] DOB: 1932-04-02 Today's Date: 01/14/2021    History of present illness Pt is an 87 y.o. male admitted from [facility] on 01/10/21 with hypotension; workup for gastroenteritis and dehydration, COVID-19. PMH includes PAF s/p pacemaker, cardiomyopathy s/p ICD, CKD III, anemia, colon CA. Of note, recent admission 09/2020 with COVID, then 11/2020 with VT and d/c to SNF.   OT comments  Pt seen in conjunction with PT.  He was abel to stand x 3 with mod - max A +2 for peri care due to bowel incontinence.  He requires mod - max A +2 for functional transfers.  Plan is for discharge home with daughter and Hospice.  Recommend ambulance transfer as pt tends to sit quickly and unexpectedly.    Follow Up Recommendations  Home health OT;Supervision/Assistance - 24 hour    Equipment Recommendations  Wheelchair (measurements OT);Wheelchair cushion (measurements OT);Hospital bed    Recommendations for Other Services      Precautions / Restrictions Precautions Precautions: Fall;Other (comment) Precaution Comments: Bowel incontinence; quick to 'give out' and need to sit Restrictions Weight Bearing Restrictions: No       Mobility Bed Mobility Overal bed mobility: Needs Assistance Bed Mobility: Supine to Sit     Supine to sit: Mod assist;HOB elevated     General bed mobility comments: ModA for HHA to assist trunk elevation and scooting hips to EOB; increased time and effort  Transfers Overall transfer level: Needs assistance Equipment used: Rolling walker (2 wheeled) Transfers: Sit to/from [facility] Sit to Stand: Mod assist;Max assist;+2 safety/equipment Stand pivot transfers: Max assist;+2 physical assistance       General transfer comment: Fluctuates from mod-maxA+2 to assist trunk elevation, difficulty achieving fully upright posture; assist to stabilize RW; multiple sit<>stands  from EOB and recliner to RW; pt quick to fatigue requiring maxA for eccentric control into sitting    Balance Overall balance assessment: Needs assistance Sitting-balance support: No upper extremity supported;Feet supported Sitting balance-Leahy Scale: Fair Sitting balance - Comments: Able to sit for brief bouts with no UE support, min guard; quick to fatigue with L lateral and posterior lean onto elbow, or forward lean onto walker   Standing balance support: Bilateral upper extremity supported;During functional activity Standing balance-Leahy Scale: Poor Standing balance comment: Reliant on UE support and external assist; dependent for pericare                           ADL either performed or assessed with clinical judgement   ADL Overall ADL's : Needs assistance/impaired                         Toilet Transfer: +2 for physical assistance;Stand-pivot;BSC;RW;Maximal assistance   Toileting- Clothing Manipulation and Hygiene: Total assistance Toileting - Clothing Manipulation Details (indicate cue type and reason): Pt with stool incontinence             Vision       Perception     Praxis      Cognition Arousal/Alertness: Awake/alert Behavior During Therapy: WFL for tasks assessed/performed Overall Cognitive Status: No family/caregiver present to determine baseline cognitive functioning Area of Impairment: Orientation;Attention;Memory;Safety/judgement;Awareness;Problem solving;Following commands                 Orientation Level: Disoriented to;Place;Time Current Attention Level: Sustained;Selective Memory: Decreased short-term memory Following Commands: Follows one step commands with increased time  Safety/Judgement: Decreased awareness of safety;Decreased awareness of deficits Awareness: Intellectual;Emergent Problem Solving: Slow processing;Difficulty sequencing General Comments: Pt endorses increased "groggy" feeling after taking medicine  earlier. Pt able to state he could not leave yet because hospital bed needed to bed delivered. Repetitive with information. Responds well to music. Unaware of bowel incontinence        Exercises     Shoulder Instructions       General Comments SpO2 96% on RA, HR 90s-100s. Pt reports, "Everytime I take a step, I take a chance" - regarding fall risk and bruising on BLEs    Pertinent Vitals/ Pain       Pain Assessment: Faces Faces Pain Scale: Hurts a little bit Pain Location: Generalized Pain Descriptors / Indicators: Tiring Pain Intervention(s): Monitored during session  Home Living                                          Prior Functioning/Environment              Frequency  Min 2X/week        Progress Toward Goals  OT Goals(current goals can now be found in the care plan section)  Progress towards OT goals: Progressing toward goals     Plan Discharge plan remains appropriate    Co-evaluation    PT/OT/SLP Co-Evaluation/Treatment: Yes Reason for Co-Treatment: For patient/therapist safety;To address functional/ADL transfers PT goals addressed during session: Mobility/safety with mobility;Balance;Proper use of DME OT goals addressed during session: ADL's and self-care      AM-PAC OT "6 Clicks" Daily Activity     Outcome Measure   Help from another person eating meals?: A Little Help from another person taking care of personal grooming?: A Little Help from another person toileting, which includes using toliet, bedpan, or urinal?: Total Help from another person bathing (including washing, rinsing, drying)?: A Lot Help from another person to put on and taking off regular upper body clothing?: A Little Help from another person to put on and taking off regular lower body clothing?: A Lot 6 Click Score: 14    End of Session Equipment Utilized During Treatment: Gait belt;Rolling walker  OT Visit Diagnosis: Unsteadiness on feet (R26.81);Other  abnormalities of gait and mobility (R26.89);Muscle weakness (generalized) (M62.81);Pain;Other symptoms and signs involving cognitive function   Activity Tolerance Patient tolerated treatment well   Patient Left in chair;with call bell/phone within reach;with chair alarm set   Nurse Communication Mobility status        Time: 1246-1311 OT Time Calculation (min): 25 min  Charges: OT General Charges $OT Visit: 1 Visit OT Treatments $Therapeutic Activity: 8-22 mins  Nilsa Nutting OTR/L Acute Rehabilitation Services Pager [phone] Office [phone]    Lucille Passy M 01/14/2021, 3:54 PM

## 2021-01-14 NOTE — Progress Notes (Signed)
   Plans for palliative care and discharge soon are noted. No further suggestions at this time. Cardiology will sign-off. Call with questions.  Pixie Casino, MD, [facility], [place] Director of the Advanced Lipid Disorders &  Cardiovascular Risk Reduction Clinic Diplomate of the American Board of Clinical Lipidology Attending Cardiologist  Direct Dial: [phone]  Fax: [phone]  Website:  [email or web address]

## 2021-01-14 NOTE — Consult Note (Signed)
   [facility] Inpatient Consult   01/14/2021  [name] [name] February 28, 1932 [id number]  [facility] [ACO] Patient: [name] [name] HMO   Patient screened for hospitalization with noted extreme high risk score for unplanned readmission risk and less than 30 days readmission hospitalization with 3 admissions in the past 6 months.  Electronic chart reviewed for progress and disposition needs to assess for potential [place] Management service needs for post [name] transition.  Review of patient's medical record reveals patient and family are in consultation with Palliative Care.  Plan:  Continue to follow progress and disposition to assess for post [name] care management needs.    For questions contact:   Natividad Brood, RN BSN [name] Liaison  [phone] business mobile phone Toll free office [phone]  Fax number: [phone] [email or web address]

## 2021-01-14 NOTE — Progress Notes (Signed)
Palliative Medicine Inpatient Follow Up Note  Reason for consult:  Goals of Care  HPI:  Per intake H&P --> [name] [name] a 87 y.o.malewithhistory of proximal atrial fibrillation status post pacemaker placement, cardiomyopathy status post ICD placement last EF was around 35 to 40%, chronic kidney disease stage III, chronic anemia history of colon cancer admitted November for Covid infection and admitted in January last month for VT at that time was placed on amiodarone presently in rehab was found to be lethargic and hypotensive when has been examined diarrhea. Patient was brought to the ER for above complaints. Did not have any chest pain or shortness of breath.  Palliative care was asked to get involved to aid in further goals of care conversations.   Today's Discussion (01/14/2021):  *Please note that this is a verbal dictation therefore any spelling or grammatical errors are due to the "[place]" system interpretation.  Chart reviewed.  I met with [name] [name] this morning. He was in no distress. He was awake and alert. He reviewed with me the difficulties of the last few months and the efforts that have been made for him to be more functional again. He shares with me, "I have not been at home in over sixty days." We discussed how difficult it has been for him to be in and out of the hospital. We reviewed also the disorientation associated with this. He shares he me, "I want to go home." I told him that as far as I am aware that this is the plan and that the case management team as well as his daughter, [name] are working on making this a reality. He tells me that I am not being honest with him. I gave him time to express his frustrations, he shares that he feels, "lied to" by the healthcare team. He expresses that he does not know if he should believe what people tell him. I shared with him that he will discharge in the next day. Reviewed that he will feel much more comfortable and  relieved once he has the opportunity to be in his home environment,  Of note [facility] is delivering DME. Plan for transition home as early as tomorrow.  Questions and concerns addressed   Objective Assessment: Vital Signs Vitals:   01/13/21 2356 01/14/21 0450  BP: 93/66 96/64  Pulse: 97 92  Resp: 18 17  Temp: 97.6 F (36.4 C) 97.8 F (36.6 C)  SpO2: 99% 100%    Intake/Output Summary (Last 24 hours) at 01/14/2021 1610 Last data filed at 01/13/2021 2100 Gross per 24 hour  Intake 200 ml  Output 850 ml  Net -650 ml   Last Weight  Most recent update: 01/11/2021  5:10 AM   Weight  57.6 kg (126 lb 15.8 oz)           Gen:  Frail Eldely Caucasian M  HEENT: dry mucous membranes CV: Regular rate and irregular rhythm  PULM: On RA ABD: soft/nontender  EXT: No edema  Neuro: Alert and oriented x2  SUMMARY OF RECOMMENDATIONS DNAR/DNI  Incremental PMT support. Plan is for patient to transition home with [facility] hospice Friday.  Time Spent: 25 Greater than 50% of the time was spent in counseling and coordination of care ______________________________________________________________________________________ [place] Team Team Cell Phone: [phone] Please utilize secure chat with additional questions, if there is no response within 30 minutes please call the above phone number  Palliative Medicine Team providers are available by  phone from 7am to 7pm daily and can be reached through the team cell phone.  Should this patient require assistance outside of these hours, please call the patient's attending physician.     

## 2021-01-15 DIAGNOSIS — E861 Hypovolemia: Secondary | ICD-10-CM | POA: Diagnosis not present

## 2021-01-15 DIAGNOSIS — I9589 Other hypotension: Secondary | ICD-10-CM | POA: Diagnosis not present

## 2021-01-15 LAB — CULTURE, BLOOD (ROUTINE X 2)
Culture: NO GROWTH
Special Requests: ADEQUATE

## 2021-01-15 MED ORDER — NITROGLYCERIN 0.4 MG SL SUBL: 0.4000 mg | SUBLINGUAL_TABLET | SUBLINGUAL | 0 refills | Status: AC | PRN

## 2021-01-15 MED ORDER — VITAMIN D 1000 UNITS PO TABS: 1000.0000 [IU] | ORAL_TABLET | Freq: Every day | ORAL | 0 refills | Status: AC

## 2021-01-15 MED ORDER — AMIODARONE HCL 200 MG PO TABS: 200.0000 mg | ORAL_TABLET | Freq: Every day | ORAL | Status: AC

## 2021-01-15 MED ORDER — MORPHINE SULFATE (CONCENTRATE) 10 MG/0.5ML PO SOLN: 10.0000 mg | ORAL | 0 refills | Status: AC | PRN

## 2021-01-15 MED ORDER — DIGOXIN 0.25 MG/ML IJ SOLN
0.2500 mg | Freq: Four times a day (QID) | INTRAMUSCULAR | Status: DC
  Administered 2021-01-15: 0.25 mg via INTRAVENOUS
  Filled 2021-01-15: qty 2

## 2021-01-15 MED ORDER — METOPROLOL TARTRATE 25 MG PO TABS
25.0000 mg | ORAL_TABLET | Freq: Two times a day (BID) | ORAL | Status: DC
  Administered 2021-01-15: 25 mg via ORAL
  Filled 2021-01-15: qty 1

## 2021-01-15 MED ORDER — TAMSULOSIN HCL 0.4 MG PO CAPS: 0.4000 mg | ORAL_CAPSULE | Freq: Every day | ORAL | 0 refills | Status: AC

## 2021-01-15 MED ORDER — ALBUTEROL SULFATE HFA 108 (90 BASE) MCG/ACT IN AERS: 2.0000 | INHALATION_SPRAY | RESPIRATORY_TRACT | 0 refills | Status: AC | PRN

## 2021-01-15 MED ORDER — PANTOPRAZOLE SODIUM 40 MG PO TBEC: 40.0000 mg | DELAYED_RELEASE_TABLET | Freq: Every day | ORAL | 0 refills | Status: AC

## 2021-01-15 MED ORDER — VITAMIN B-12 1000 MCG PO TABS: 1000.0000 ug | ORAL_TABLET | Freq: Every day | ORAL | 0 refills | Status: AC

## 2021-01-15 MED ORDER — CHLORHEXIDINE GLUCONATE CLOTH 2 % EX PADS: 6.0000 | MEDICATED_PAD | Freq: Every day | CUTANEOUS | Status: DC

## 2021-01-15 NOTE — Progress Notes (Signed)
Daylon L Pensyl to be D/C'd home with hospice per MD order. Discussed with the patient's daughter, [name]. Discussed prescriptions and explained that home hospice nurse would show her what she needs to know when she arrives to their home. Skin clean, dry and intact without evidence of skin break down, some scattered bruising noted.  IV catheter discontinued intact. Site without signs and symptoms of complications. Dressing and pressure applied.  An After Visit Summary was printed and given to the patient.  Patient escorted via stretcher, and D/C home via [facility]AR.  Melonie Florida  01/15/2021 12:15 PM

## 2021-01-15 NOTE — Discharge Instructions (Signed)
Disposition.  Residential hospice °Condition.  Guarded °CODE STATUS.  DNR °Activity.  With assistance as tolerated, full fall precautions. °Diet.  Soft with feeding assistance and aspiration precautions. °Goal of care.  Comfort. ° °

## 2021-01-15 NOTE — Progress Notes (Signed)
   Palliative Medicine Inpatient Follow Up Note  I went to see [name] [name] this afternoon.   He had already been discharged therefore I was unable to complete any additional assessments.   No Charge ______________________________________________________________________________________ [place] Team Team Cell Phone: [phone] Please utilize secure chat with additional questions, if there is no response within 30 minutes please call the above phone number  Palliative Medicine Team providers are available by phone from 7am to 7pm daily and can be reached through the team cell phone.  Should this patient require assistance outside of these hours, please call the patient's attending physician.

## 2021-01-15 NOTE — Discharge Summary (Addendum)
DEFORREST BOGLE [name]:[id number] DOB: 10-Jun-1932 DOA: 01/10/2021  PCP: Crist Infante, MD  Admit date: 01/10/2021  Discharge date: 01/15/2021  Admitted From: Home  Disposition: Home with hospice   Recommendations for Outpatient Follow-up:   Follow up with PCP in 1-2 weeks  PCP Please obtain BMP/CBC, 2 view CXR in 1week,  (see Discharge instructions)   PCP Please follow up on the following pending results:   Home Health: H.Hospice   Equipment/Devices:   Consultations: [place] Discharge Condition: Guarded   CODE STATUS: DNR   Diet Recommendation: Heart Healthy Soft    Chief Complaint  Patient presents with  . Dehydration  . Hypotension     Brief history of present illness from the day of admission and additional interim summary    [name] [name] a 87 y.o.malewithhistory of proximal atrial fibrillation status post pacemaker placement, cardiomyopathy status post ICD placement last EF was around 35 to 40%, chronic kidney disease stage III, chronic anemia history of colon cancer admitted November for Covid infection and admitted in January last month for VT at that time was placed on amiodarone presently in rehab was found to be lethargic and hypotensive with diarrhea.  Was admitted for COVID-19 infection with gastroenteritis and dehydration.                                                                 Hospital Course     1. Acute COVID-19 pneumonia in a patient who has taken 2 shots of vaccine so far -thankfully most of his symptoms are of toxic encephalopathy, no hypoxia.    He did not require any Covid specific treatment.  2.  Dehydration with hypotension.    After gentle hydration note he is very frail due to his advanced age and has limited oral intake, long-term prognosis is poor, he is being discharged  with home hospice, goal of care is comfort  3.  Weakness and deconditioning.  PT OT, daughter now wants to take him home with palliative care/hospice, goal of care is comfort, DC on Friday.  4.  BPH.  On Flomax, despite Flomax he was persistently retaining urine and finally had to place Foley catheter on 01/15/2021, since goal of care is comfort will leave him with Foley catheter upon discharge.  5.  History of paroxysmal A. fib. For now amiodarone, holding beta-blocker and ACE inhibitor due to hypotension and dehydration.  [name] of greater than 3 but on no anticoagulation due to high fall risk, will discharge on oral amiodarone, low-dose beta-blocker, no aspirin due to listed allergy as bleeding.  Again goal of care is comfort.  6.  COVID-19 induced gastroenteritis.  Supportive care.  No GI complaints.  Nonspecific CT findings, outpatient age-appropriate follow-up by PCP.  Per son no heroics no surgery desired.  7.  Chronic systolic heart failure last EF around 30%.  Dehydrated, resume home dose diuretics upon discharge.  Poor candidate for ACE or ARB due to advanced age and CKD 4.  8.  AKI on CKD 4.  Baseline creatinine 1.7.  Improved after hydration, now goal of care comfort.   Discharge diagnosis     Principal Problem:   Hypotension due to hypovolemia Active Problems:   A-fib (HCC)   Ischemic cardiomyopathy   Acute on chronic systolic (congestive) heart failure (HCC)   COVID-19 virus infection   Hypothyroidism   Dehydration   Hypotension   Palliative care by specialist   Goals of care, counseling/discussion   DNR (do not resuscitate)    Discharge instructions    Discharge Instructions    Discharge instructions   Complete by: As directed    Disposition.  Residential hospice Condition.  Guarded CODE STATUS.  DNR Activity.  With assistance as tolerated, full fall precautions. Diet.  Soft with feeding assistance and aspiration precautions. Goal of care.   Comfort.      Discharge Medications   Allergies as of 01/15/2021      Reactions   Iron    Stomach issues   Lescol [fluvastatin Sodium]    Dizziness   Pentazocine Lactate    unknown   Vytorin [ezetimibe-simvastatin]    Leg cramps   Aspirin    Bleeding      Medication List    STOP taking these medications   metoprolol succinate 100 MG 24 hr tablet Commonly known as: TOPROL-XL   ramipril 2.5 MG capsule Commonly known as: ALTACE     TAKE these medications   amiodarone 200 MG tablet Commonly known as: Pacerone Take 2 tablets (400 mg total) by mouth 2 (two) times daily. START with 400mg  PO BID for 10 days THEN reduce to 400mg  PO daily for 10 days THEN reduce to 200mg  PO daily What changed:   how much to take  when to take this  additional instructions   Besivance 0.6 % Susp Generic drug: Besifloxacin HCl Place 1 drop into the right eye 3 (three) times daily.   cholecalciferol 1000 units tablet Commonly known as: VITAMIN D Take 1,000 Units by mouth daily.   diphenoxylate-atropine 2.5-0.025 MG tablet Commonly known as: LOMOTIL Take 1 tablet by mouth 4 (four) times daily as needed for diarrhea or loose stools.   Durezol 0.05 % Emul Generic drug: Difluprednate Place 1 drop into the right eye 3 (three) times daily.   furosemide 40 MG tablet Commonly known as: LASIX Take 1 tablet (40 mg total) by mouth daily.   Magnesium Oxide 200 MG Tabs Take 1 tablet (200 mg total) by mouth daily.   morphine CONCENTRATE 10 MG/0.5ML Soln concentrated solution Take 0.5 mLs (10 mg total) by mouth every 3 (three) hours as needed for moderate pain or severe pain.   multivitamins ther. w/minerals Tabs tablet Take 1 tablet by mouth daily.   nitroGLYCERIN 0.4 MG SL tablet Commonly known as: NITROSTAT Place 1 tablet (0.4 mg total) under the tongue every 5 (five) minutes as needed for chest pain.   pantoprazole 40 MG tablet Commonly known as: PROTONIX Take 1 tablet (40 mg  total) by mouth 2 (two) times daily. What changed: when to take this   ProAir HFA 108 (90 Base) MCG/ACT inhaler Generic drug: albuterol Inhale 2 puffs into the lungs every 4 (four) hours as needed for shortness of breath.   spironolactone 25 MG tablet Commonly known as:  ALDACTONE TAKE 1/2 TABLET EVERY DAY (NEED MD APPOINTMENT) What changed: See the new instructions.   tamsulosin 0.4 MG Caps capsule Commonly known as: FLOMAX Take 1 capsule (0.4 mg total) by mouth daily.   vitamin B-12 1000 MCG tablet Commonly known as: CYANOCOBALAMIN Take 1,000 mcg by mouth daily.         Major procedures and Radiology Reports - PLEASE review detailed and final reports thoroughly  -      CT Head Wo Contrast  Result Date: 01/10/2021 CLINICAL DATA:  Mental status changes of unknown cause. EXAM: CT HEAD WITHOUT CONTRAST TECHNIQUE: Contiguous axial images were obtained from the base of the skull through the vertex without intravenous contrast. COMPARISON:  10/14/2013 FINDINGS: Brain: Age related generalized atrophy. Chronic small-vessel ischemic changes of the cerebral hemispheric white matter. No sign of acute infarction, mass lesion, hemorrhage, hydrocephalus or extra-axial collection. Vascular: There is atherosclerotic calcification of the major vessels at the base of the brain. Skull: Negative Sinuses/Orbits: Clear/normal Other: None IMPRESSION: No acute finding by CT. Age related atrophy and chronic small-vessel ischemic changes of the white matter. Electronically Signed   By: Nelson Chimes M.D.   On: 01/10/2021 21:14   CT Angio Chest PE W and/or Wo Contrast  Result Date: 01/10/2021 CLINICAL DATA:  PE suspected, high prob Weakness and lethargy. EXAM: CT ANGIOGRAPHY CHEST WITH CONTRAST TECHNIQUE: Multidetector CT imaging of the chest was performed using the standard protocol during bolus administration of intravenous contrast. Multiplanar CT image reconstructions and MIPs were obtained to evaluate the  vascular anatomy. CONTRAST:  76mL OMNIPAQUE IOHEXOL 350 MG/ML SOLN COMPARISON:  Radiograph earlier today.  Chest CT 10/14/2013 FINDINGS: Cardiovascular: There are no filling defects within the pulmonary arteries to suggest pulmonary embolus. Left-sided pacemaker in place. Aortic atherosclerosis and tortuosity. Multi chamber cardiomegaly with contrast refluxing into the hepatic veins and IVC. No pericardial effusion. Post CABG with native coronary artery calcification. Contrast refluxes into chest wall collaterals in the azygos system. Mediastinum/Nodes: Shotty mediastinal lymph nodes, all subcentimeter. No hilar adenopathy. Decompressed esophagus. No thyroid nodule. Lungs/Pleura: Small bilateral pleural effusions, right greater than left. Scattered reticular and ground-glass opacities throughout both lungs, likely representing scarring, many at site of prior nodular airspace disease. Small amount of fluid in the right major fissure, with questionable fissural nodularity. Trachea and central bronchi are patent. No pulmonary mass. Upper Abdomen: Assessed on concurrent abdominal CT, reported separately. Musculoskeletal: Scoliosis and ordinary degenerative change in the spine. There are no acute or suspicious osseous abnormalities. Review of the MIP images confirms the above findings. IMPRESSION: 1. No pulmonary embolus. 2. Multi chamber cardiomegaly with contrast refluxing into the hepatic veins and IVC consistent with elevated right heart pressures. Small bilateral pleural effusions, right greater than left. 3. Scattered reticular and ground-glass opacities throughout both lungs, likely representing scarring, many at site of prior nodular airspace disease. Pneumonia is not entirely excluded, recommend correlation for acute infective symptoms. Aortic Atherosclerosis (ICD10-I70.0). Electronically Signed   By: Keith Rake M.D.   On: 01/10/2021 23:45   CT ABDOMEN PELVIS W CONTRAST  Result Date:  01/10/2021 CLINICAL DATA:  Epigastric abdominal pain. Unexplained hypotension. Lethargy and weakness. EXAM: CT ABDOMEN AND PELVIS WITH CONTRAST TECHNIQUE: Multidetector CT imaging of the abdomen and pelvis was performed using the standard protocol following bolus administration of intravenous contrast. CONTRAST:  47mL OMNIPAQUE IOHEXOL 350 MG/ML SOLN COMPARISON:  CT 419 FINDINGS: Lower chest: Small pleural effusions and multi chamber cardiomegaly. Detailed assessment on concurrent chest CT, reported separately.  Hepatobiliary: No focal liver abnormality. Post cholecystectomy. There is chronic biliary dilatation with mid common bile duct measuring 15 mm. This appears similar to prior exam. There is central intrahepatic biliary ductal dilatation. No evidence of choledocholithiasis. Pancreas: Parenchymal atrophy. No ductal dilatation or inflammation. Spleen: Normal in size without focal abnormality. Surgical clips at the hilum. Adrenals/Urinary Tract: Stable left adrenal thickening. Normal right adrenal gland. No suspicious nodule. No hydronephrosis or perinephric edema. Small cysts in the upper right kidney, as well as low-density lesions too small to characterize. Partially exophytic enhancing lesion from the posterior left kidney measures 14 x 8 mm, previously 13 x 11 mm. Absent renal excretion on delayed phase imaging. Urinary bladder is distended. No bladder wall thickening. Stomach/Bowel: Decompressed stomach. Few fluid-filled bowel small bowel loops in the lower abdomen. Mild hyperemia of a short segment of small bowel in the central abdomen, series 3, image 40. No obstruction. Presumed prior right hemicolectomy with enteric anastomosis in the right upper quadrant. Moderate stool in the distal colon. Surgical clips adjacent to the rectosigmoid. Vascular/Lymphatic: Aortic atherosclerosis and tortuosity. No aortic aneurysm. No acute vascular findings. Patent portal vein. No enlarged lymph nodes in the abdomen or  pelvis. Reproductive: Prominent prostate gland. Other: Small amount of ascites in the right and left upper quadrant. No free air or focal fluid collection. Musculoskeletal: Scoliosis and degenerative change throughout the spine. There are no acute or suspicious osseous abnormalities. IMPRESSION: 1. Mild hyperemia of a short segment of small bowel in the central abdomen, suspicious for enteritis. No obstruction. 2. Small amount of ascites in the right and left upper quadrant. 3. Absent renal excretion on delayed phase imaging suggest underlying renal dysfunction. 4. Chronic biliary dilatation, unchanged from prior exam, likely related to prior cholecystectomy. Recommend correlation with LFTs. 5. Partially exophytic enhancing lesion from the posterior left kidney measures 14 x 8 mm, previously 13 x 11 mm. This remains concerning for incidental renal neoplasm, however the lack of significant change in size over the course of 2 years suggests this is indolent. Should further workup be pursued, recommend MRI for characterization if patient is able to tolerate breath hold technique, giving consideration for density age. Aortic Atherosclerosis (ICD10-I70.0). Electronically Signed   By: Keith Rake M.D.   On: 01/10/2021 23:53   DG Chest Portable 1 View  Result Date: 01/10/2021 CLINICAL DATA:  Altered mental status EXAM: PORTABLE CHEST 1 VIEW COMPARISON:  December 13, 2020 FINDINGS: There is a dual chamber pacemaker/ICD in place. The heart size is enlarged. There is a small left-sided pleural effusion. There are chronic airspace opacities bilaterally. No pneumothorax. Atelectasis versus scarring is noted at the left lung base. There is no definite acute osseous abnormality. IMPRESSION: 1. Cardiomegaly with small left pleural effusion. 2. Scattered chronic airspace opacities bilaterally favored to represent scarring or atelectasis. Electronically Signed   By: Constance Holster M.D.   On: 01/10/2021 20:47   DG Abd  Portable 1V  Result Date: 01/11/2021 CLINICAL DATA:  Nausea and vomiting in a patient who is COVID positive. EXAM: PORTABLE ABDOMEN - 1 VIEW COMPARISON:  CT chest, abdomen and pelvis yesterday. FINDINGS: The bowel gas pattern is normal. No radio-opaque calculi or other significant radiographic abnormality are seen. Contrast in the urinary bladder from the patient's CT scan is noted. There is marked convex left scoliosis and multilevel degenerative change. Surgical clips there is scattered in the abdomen and pelvis. IMPRESSION: No acute abnormality. Electronically Signed   By: Inge Rise M.D.  On: 01/11/2021 11:16   CUP PACEART INCLINIC DEVICE CHECK  Result Date: 12/25/2020 Pacemaker check in clinic. Normal device function. Thresholds, sensing, impedances consistent with previous measurements. Device programmed to maximize longevity. No new high ventricular rates noted. Device programmed at appropriate safety margins. Histogram distribution appropriate for patient activity level. Device programmed to optimize intrinsic conduction. Estimated longevity _11 years__. Patient enrolled in remote follow-up   Micro Results     Recent Results (from the past 240 hour(s))  Blood culture (routine x 2)     Status: None (Preliminary result)   Collection Time: 01/10/21  9:52 PM   Specimen: BLOOD  Result Value Ref Range Status   Specimen Description BLOOD LEFT ARM  Final   Special Requests   Final    BOTTLES DRAWN AEROBIC AND ANAEROBIC Blood Culture adequate volume   Culture   Final    NO GROWTH 4 DAYS Performed at [facility], [address], [place], [place] [address]    Report Status PENDING  Incomplete  Resp Panel by RT-PCR (Flu A&B, Covid) Nasopharyngeal Swab     Status: Abnormal   Collection Time: 01/10/21 10:00 PM   Specimen: Nasopharyngeal Swab; Nasopharyngeal(NP) swabs in vial transport medium  Result Value Ref Range Status   SARS Coronavirus 2 by RT PCR POSITIVE (A) NEGATIVE Final     Comment: RESULT CALLED TO, READ BACK BY AND VERIFIED WITH: K. SETRAM,RN 0006 01/11/2021 T. TYSOR (NOTE) SARS-CoV-2 target nucleic acids are DETECTED.  The SARS-CoV-2 RNA is generally detectable in upper respiratory specimens during the acute phase of infection. Positive results are indicative of the presence of the identified virus, but do not rule out bacterial infection or co-infection with other pathogens not detected by the test. Clinical correlation with patient history and other diagnostic information is necessary to determine patient infection status. The expected result is Negative.  Fact Sheet for Patients: EntrepreneurPulse.com.au  Fact Sheet for Healthcare Providers: IncredibleEmployment.be  This test is not yet approved or cleared by the [place] FDA and  has been authorized for detection and/or diagnosis of SARS-CoV-2 by FDA under an Emergency Use Authorization (EUA).  This EUA will remain in effect (meaning this test ca n be used) for the duration of  the COVID-19 declaration under Section 564(b)(1) of the Act, 21 U.S.C. section 360bbb-3(b)(1), unless the authorization is terminated or revoked sooner.     Influenza A by PCR NEGATIVE NEGATIVE Final   Influenza B by PCR NEGATIVE NEGATIVE Final    Comment: (NOTE) The Xpert Xpress SARS-CoV-2/FLU/RSV plus assay is intended as an aid in the diagnosis of influenza from Nasopharyngeal swab specimens and should not be used as a sole basis for treatment. Nasal washings and aspirates are unacceptable for Xpert Xpress SARS-CoV-2/FLU/RSV testing.  Fact Sheet for Patients: EntrepreneurPulse.com.au  Fact Sheet for Healthcare Providers: IncredibleEmployment.be  This test is not yet approved or cleared by the [place] FDA and has been authorized for detection and/or diagnosis of SARS-CoV-2 by FDA under an Emergency Use Authorization (EUA).  This EUA will remain in effect (meaning this test can be used) for the duration of the COVID-19 declaration under Section 564(b)(1) of the Act, 21 U.S.C. section 360bbb-3(b)(1), unless the authorization is terminated or revoked.  Performed at [facility], [place] [address] [place], [place] [address]   Blood culture (routine x 2)     Status: None (Preliminary result)   Collection Time: 01/10/21 11:45 PM   Specimen: BLOOD  Result Value Ref  Range Status   Specimen Description BLOOD RIGHT HAND  Final   Special Requests   Final    BOTTLES DRAWN AEROBIC AND ANAEROBIC Blood Culture results may not be optimal due to an inadequate volume of blood received in culture bottles   Culture   Final    NO GROWTH 3 DAYS Performed at [facility], [place] [address], [place], [place] [address]    Report Status PENDING  Incomplete  MRSA PCR Screening     Status: None   Collection Time: 01/11/21  7:48 AM   Specimen: Nasal Mucosa; Nasopharyngeal  Result Value Ref Range Status   MRSA by PCR NEGATIVE NEGATIVE Final    Comment:        The GeneXpert MRSA Assay (FDA approved for NASAL specimens only), is one component of a comprehensive MRSA colonization surveillance program. It is not intended to diagnose MRSA infection nor to guide or monitor treatment for MRSA infections. Performed at [facility], [place] [address], [place], [place] [address]     Today   Subjective    Derron Pipkins today has no headache,no chest abdominal pain,no new weakness tingling or numbness, feels much better wants to go home today.    Objective   Blood pressure 103/73, pulse 98, temperature 98.2 F (36.8 C), temperature source Axillary, resp. rate 19, height 5\' 10"  (1.778 m), weight 57.6 kg, SpO2 98 %.   Intake/Output Summary (Last 24 hours) at 01/15/2021 1145 Last data filed at 01/15/2021 1014 Gross per 24 hour  Intake 480 ml  Output 750 ml  Net -270 ml    Exam  Awake Alert, No new F.N deficits,  Normal affect [place].AT,PERRAL Supple Neck,No JVD, No cervical lymphadenopathy appriciated.  Symmetrical Chest wall movement, Good air movement bilaterally, CTAB RRR,No Gallops,Rubs or new Murmurs, No Parasternal Heave +ve B.Sounds, Abd Soft, Non tender, No organomegaly appriciated, No rebound -guarding or rigidity. No Cyanosis, Clubbing or edema, No new Rash or bruise   Data Review   CBC w Diff:  Lab Results  Component Value Date   WBC 7.2 01/13/2021   HGB 8.6 (L) 01/13/2021   HGB 10.7 (L) 08/20/2018   HCT 26.6 (L) 01/13/2021   HCT 28.1 (L) 10/23/2020   PLT 201 01/13/2021   PLT 275 08/20/2018   LYMPHOPCT 5 01/13/2021   MONOPCT 8 01/13/2021   EOSPCT 0 01/13/2021   BASOPCT 0 01/13/2021    CMP:  Lab Results  Component Value Date   NA 136 01/13/2021   NA 139 12/25/2020   K 4.4 01/13/2021   CL 105 01/13/2021   CO2 22 01/13/2021   BUN 44 (H) 01/13/2021   BUN 21 12/25/2020   CREATININE 1.80 (H) 01/13/2021   CREATININE 1.12 (H) 02/25/2016   PROT 5.5 (L) 01/13/2021   PROT 5.9 (L) 12/25/2020   ALBUMIN 3.0 (L) 01/13/2021   ALBUMIN 3.5 (L) 12/25/2020   BILITOT 0.7 01/13/2021   BILITOT 0.8 12/25/2020   ALKPHOS 45 01/13/2021   AST 23 01/13/2021   ALT 20 01/13/2021  .   Total Time in preparing paper work, data evaluation and todays exam - 24 minutes  Lala Lund M.D on 01/15/2021 at 11:45 AM  [facility]

## 2021-01-15 NOTE — TOC Progression Note (Signed)
Transition of Care [facility]) - Progression Note    Patient Details  Name: [name] [name] MRN: [id number] Date of Birth: Jun 06, 1932  Transition of Care [facility]) CM/SW [place], LCSW Phone Number: 01/15/2021, 11:20 AM  Clinical Narrative:    CSW spoke with patient's daughter regarding patient probably needing PTAR rather than going by car. She reported frustration with the change as she has to go and wait at his house, but CSW reassured her that we would go ahead and call PTAR to see if we can get patient home by 2:30pm when [place] arrives.    Expected Discharge Plan: Home w Hospice Care Barriers to Discharge: Continued Medical Work up  Expected Discharge Plan and Services Expected Discharge Plan: [place]   Discharge Planning Services: CM Consult     Expected Discharge Date: 01/15/21                                     Social Determinants of Health (SDOH) Interventions    Readmission Risk Interventions No flowsheet data found.

## 2021-01-15 NOTE — Plan of Care (Signed)
  Problem: Education: ?Goal: Knowledge of risk factors and measures for prevention of condition will improve ?Outcome: Progressing ?  ?Problem: Coping: ?Goal: Psychosocial and spiritual needs will be supported ?Outcome: Progressing ?  ?Problem: Respiratory: ?Goal: Will maintain a patent airway ?Outcome: Progressing ?Goal: Complications related to the disease process, condition or treatment will be avoided or minimized ?Outcome: Progressing ?  ?Problem: Education: ?Goal: Knowledge of General Education information will improve ?Description: Including pain rating scale, medication(s)/side effects and non-pharmacologic comfort measures ?Outcome: Progressing ?  ?Problem: Health Behavior/Discharge Planning: ?Goal: Ability to manage health-related needs will improve ?Outcome: Progressing ?  ?Problem: Clinical Measurements: ?Goal: Ability to maintain clinical measurements within normal limits will improve ?Outcome: Progressing ?Goal: Will remain free from infection ?Outcome: Progressing ?Goal: Diagnostic test results will improve ?Outcome: Progressing ?Goal: Respiratory complications will improve ?Outcome: Progressing ?Goal: Cardiovascular complication will be avoided ?Outcome: Progressing ?  ?Problem: Activity: ?Goal: Risk for activity intolerance will decrease ?Outcome: Progressing ?  ?Problem: Nutrition: ?Goal: Adequate nutrition will be maintained ?Outcome: Progressing ?  ?Problem: Coping: ?Goal: Level of anxiety will decrease ?Outcome: Progressing ?  ?Problem: Elimination: ?Goal: Will not experience complications related to bowel motility ?Outcome: Progressing ?Goal: Will not experience complications related to urinary retention ?Outcome: Progressing ?  ?Problem: Pain Managment: ?Goal: General experience of comfort will improve ?Outcome: Progressing ?  ?Problem: Safety: ?Goal: Ability to remain free from injury will improve ?Outcome: Progressing ?  ?Problem: Skin Integrity: ?Goal: Risk for impaired skin integrity will  decrease ?Outcome: Progressing ?  ?Problem: Safety: ?Goal: Non-violent Restraint(s) ?Outcome: Progressing ?  ?

## 2021-01-15 NOTE — TOC Transition Note (Signed)
Transition of Care [facility]) - CM/SW Discharge Note   Patient Details  Name: [name] [name] MRN: [id number] Date of Birth: 1932-05-24  Transition of Care [facility]) CM/SW Contact:  Benard Halsted, LCSW Phone Number: 01/15/2021, 11:25 AM   Clinical Narrative:    Patient will DC to: Home Anticipated DC date: 01/15/21 Family notified: Daughter, Art therapist by: Corey Harold 12-2pm   Per MD patient ready for DC to Home. RN, patient, patient's family, and facility notified of DC. DC packet on chart with DNR. Ambulance transport requested for patient.   CSW will sign off for now as social work intervention is no longer needed. Please consult [place] again if new needs arise.      Final next level of care: Home w Hospice Care Barriers to Discharge: No Barriers Identified   Patient Goals and CMS Choice Patient states their goals for this hospitalization and ongoing recovery are:: home hospice CMS Medicare.gov Compare Post Acute Care list provided to:: Patient Represenative (must comment) Choice offered to / list presented to : Adult Children  Discharge Placement                Patient to be transferred to facility by: [place] Name of family member notified: Daughter Patient and family notified of of transfer: 01/15/21  Discharge Plan and Services   Discharge Planning Services: CM Consult                        [facility] Agency: Hospice and [place] (SDOH) Interventions     Readmission Risk Interventions No flowsheet data found.

## 2021-01-16 LAB — CULTURE, BLOOD (ROUTINE X 2): Culture: NO GROWTH

## 2021-01-18 ENCOUNTER — Telehealth: Payer: Self-pay

## 2021-01-18 NOTE — Telephone Encounter (Signed)
Patient daughter [name] [facility]) called in with hospice nurse present with questions about patients device

## 2021-01-18 NOTE — Telephone Encounter (Signed)
Spoke with Cristela Blue from [employer], pt is enrolled in Hospice care with their office.  Confirmed current device settings VVI 50BPM.  Device is PPM it will not shock pt.

## 2021-01-25 NOTE — Progress Notes (Deleted)
Cardiology Office Note Date:  01/25/2021  Patient ID:  [name] [name], [name] [name] 01/01/1932, MRN [id number] PCP:  Crist Infante, MD  Cardiologist:  Dr. [name] EP: Dr. Rayann Heman   Chief Complaint:  planned f/u >> post hospital   History of Present Illness: [name] [name] is a 87 y.o. male with history of CAD ( CABG 2002), Afib (not on a/c 2/2 GIB characterized as "major", by cardiology note and recurrent), ICM, Chronic CHF (systolic), ICD >> PPM, HTN, HLD.  His AFib described as permanent and rate controlled., not a candidate for watchman or a/c 2/2 GIBs  I note that in 2016 when his ICD was nearing ERI, was discussed given no V arrhythmias, an in appropriate therapy for AF only, LVEF improved to 40%,  and advanced age, though did have 11% V pacing, decided to replace his device with PPM.  He was hospitalized 10/23/20 with progressive and generalized weakness, lethargy, fever, found in WCT, hypotensive, febrile in the ER, anemia, GIB, and COVID +, and acute urinary retension He as cardioverted in the ER to AFib, started on amiodarone, cardiology team followed eventually resumed his BB and signed off case 10/29/20. Received steriods and Remdesivir, HGB remained stable, + melena with no overt bleeding, , discharged   TTE done 10/25/20, noted LVEF 25-30%, global hypokineis, , also mentions severe akinesis basal-mid inferior, inferolateral walls RVEF mod reduced, mildly enlarged, severe elevated pulm pressures, 74.9 LA massively dilated Severe MR Mod TR Full report in the chart Dig was resumed by cards in the hospital given his echo.  He was admitted 12/13/20 after syncopal event at home, EMS was called, lethargic, though improved in their prsence, in the ER developed VT while being prepped for cardioversion had spontaneous conversion. Started on amidarone.  Labs without any significant abnormalities, outside of a mag level 1.4  He was seen/admitted by EP, Dr. Curt Bears, started on amiodarone gtt,  planned for diuresis, n o CP and no significant HS trop, not felt to be ischemic Dr. Curt Bears noted Device his interrogation showed ventricular tachycardia this morning.   Hospital Course:  While in the ER he had recurrent VT and started lidocaine gtt.  He was transitioned to PO amiodarone and lidocaine gtt stopped though developed an up tick in NSVT WCT and amiodarone gtt resumed another day.  He was diuresed, his amiodarone transitioned to PO again since he had has had none further.  His Afib rate controlled with some V pacing as well. Not felt an ICD candidate with advanced age. PT recomended SNF/rehab. During his stay he had intermittent confusion Discharged 12/17/20 back on his home meds + amiodarone and low dose mag  I saw him 12/25/20 He comes today with his daughter. He is doing very well at rehab!  His daughter says this facility has been so much better and that he has had steady progress in strength and stability. He has cleared up well also with no ongping confusion. He denies any CP, palpitations or cardiac awareness. No near syncope or syncope, no dizzy spells. His daughter confirms, she has not heard him c/o anything or reported by the staff. No SOB, denies DOE Looks like his dig was stopped since discharge He had not had any further VT, BP was a little low, no changes were made, Asked SNF to notify if consistently SBP < 100  He was admitted to [facility] 01/11/21 for progressive dyspnea and weakness, diarrhea and hypotension, COVID + He was seen by palliative care, made DNR/DNI and  planned for home hospice Discharged 01/15/21 He did not require any COVID specific therapies D/c summary mentions home with hospice and GOC were for comfort Metoprolol and ramipril were stopped, amio contrinued   *** volume *** status? *** labs?   Device information SJM ICD >>> dual chamber PPM at time of generator  Change 09/2015 Leads are MDT RV lead is a 0102 (both RA and RV are from  2009)  AAD Amiodarone very remotely, resumed during hospital stay with GIB, COVID for [facility] and stopped at discharge >> resumed with recurrent VT Jan 2022   Past Medical History:  Diagnosis Date  . CAD (coronary artery disease)    a. Old inferolateral MI s/p CABG in 2002.  Marland Kitchen CAP (community acquired pneumonia) 10/03/2011  . Chronic systolic CHF (congestive heart failure) ([place]) 07/24/2013  . Colon cancer ([place])    s/p chemo, RT, sugery  . Colorectal anastomotic stricture   . Diverticulosis   . ED (erectile dysfunction)   . GERD (gastroesophageal reflux disease)    rarely  . HTN (hypertension)   . ICD (implantable cardiac defibrillator) discharge October 2012   removed 2016    now only PPM  . Iron deficiency anemia   . Ischemic cardiomyopathy    s/p ICD 09/30/08 with downgrade to PPM only 2016  . Permanent atrial fibrillation ([facility])    a. not a candidate for anticoag or Watchman due to recurrent GI bleeding.  . Severe mitral regurgitation   . Severe pulmonary hypertension ([place])   . Ventricular tachycardia ([place]) 11/2020    Past Surgical History:  Procedure Laterality Date  . APPENDECTOMY    . BACK SURGERY    . BIOPSY  09/22/2018   Procedure: BIOPSY;  Surgeon: Gatha Mayer, MD;  Location: [facility];  Service: Endoscopy;;  . CARDIAC CATHETERIZATION  11/07/2000   EF 32%  . CARDIAC DEFIBRILLATOR PLACEMENT  09/30/08   MDT dual chamber ICD placed by Dr Rayann Heman  . CARDIOVASCULAR STRESS TEST  08/06/2008   EF 30%  . CHOLECYSTECTOMY    . COLON RESECTION     x2  . COLONOSCOPY     multiple  . COLONOSCOPY N/A 09/22/2018   Procedure: COLONOSCOPY;  Surgeon: Gatha Mayer, MD;  Location: [facility];  Service: Endoscopy;  Laterality: N/A;  . CORONARY ARTERY BYPASS GRAFT    . EP IMPLANTABLE DEVICE N/A 10/15/2015   Procedure:  PPM Generator Changeout;  Surgeon: [name], MD;  Location: [facility];  Service: Cardiovascular;  Laterality: N/A;  . HERNIA REPAIR    . US  ECHOCARDIOGRAPHY  08/11/2008   EF 30-35%  . VASECTOMY      Current Outpatient Medications  Medication Sig Dispense Refill  . albuterol (PROAIR HFA) 108 (90 Base) MCG/ACT inhaler Inhale 2 puffs into the lungs every 4 (four) hours as needed for shortness of breath. 6.7 g 0  . amiodarone (PACERONE) 200 MG tablet Take 1 tablet (200 mg total) by mouth daily.    Marland Kitchen BESIVANCE 0.6 % SUSP Place 1 drop into the right eye 3 (three) times daily.  1  . cholecalciferol (VITAMIN D) 1000 units tablet Take 1 tablet (1,000 Units total) by mouth daily. 30 tablet 0  . diphenoxylate-atropine (LOMOTIL) 2.5-0.025 MG tablet Take 1 tablet by mouth 4 (four) times daily as needed for diarrhea or loose stools.     . DUREZOL 0.05 % EMUL Place 1 drop into the right eye 3 (three) times daily.  1  . furosemide (LASIX)  40 MG tablet Take 1 tablet (40 mg total) by mouth daily. 90 tablet 3  . Magnesium Oxide 200 MG TABS Take 1 tablet (200 mg total) by mouth daily.  0  . Morphine Sulfate (MORPHINE CONCENTRATE) 10 MG/0.5ML SOLN concentrated solution Take 0.5 mLs (10 mg total) by mouth every 3 (three) hours as needed for moderate pain or severe pain. 30 mL 0  . Multiple Vitamins-Minerals (MULTIVITAMINS THER. W/MINERALS) TABS Take 1 tablet by mouth daily.    . nitroGLYCERIN (NITROSTAT) 0.4 MG SL tablet Place 1 tablet (0.4 mg total) under the tongue every 5 (five) minutes as needed for chest pain. 25 tablet 0  . pantoprazole (PROTONIX) 40 MG tablet Take 1 tablet (40 mg total) by mouth daily. 30 tablet 0  . spironolactone (ALDACTONE) 25 MG tablet TAKE 1/2 TABLET EVERY DAY (NEED MD APPOINTMENT) (Patient taking differently: Take 12.5 mg by mouth daily.) 90 tablet 3  . tamsulosin (FLOMAX) 0.4 MG CAPS capsule Take 1 capsule (0.4 mg total) by mouth daily. 30 capsule 0  . vitamin B-12 (CYANOCOBALAMIN) 1000 MCG tablet Take 1 tablet (1,000 mcg total) by mouth daily. 30 tablet 0   No current facility-administered medications for this visit.     Allergies:   Iron, Lescol [fluvastatin sodium], Pentazocine lactate, Vytorin [ezetimibe-simvastatin], and Aspirin   Social History:  The patient  reports that he has quit smoking. His smoking use included cigars. He has never used smokeless tobacco. He reports that he does not drink alcohol and does not use drugs.   Family History:  The patient's family history includes Colon cancer in his father; Colon polyps in his brother; Diabetes in his sister; Pancreatic cancer (age of onset: 33) in his brother.  ROS:  Please see the history of present illness.  All other systems are reviewed and otherwise negative.   PHYSICAL EXAM:  VS:  There were no vitals taken for this visit. BMI: There is no height or weight on file to calculate BMI. Well nourished,thin, chronically ill appearing,  in no acute distress  HEENT: normocephalic, atraumatic  Neck: no JVD, carotid bruits or masses Cardiac:  ***  irreg-irreg; no significant murmurs, no rubs, or gallops Lungs: *** CTA b/l, no wheezing, rhonchi or rales  Abd: soft, nontender MS: no deformity, age appropriate, perhaps advanced atrophy Ext: ***  trace- 1+ edema to just above the ankle chronic looking skin changes Skin: warm and dry, no rash Neuro:  No gross deficits appreciated Psych: euthymic mood, full affect  *** PPM site is stable, no tethering or discomfort, no erosion   EKG:  Not done today  PPM interrogation done today and reviewed by myself: ***   10/25/2020: TTE IMPRESSIONS  1. Left ventricular ejection fraction, by estimation, is 25 to 30%. The  left ventricle has severely decreased function. The left ventricle  demonstrates global hypokinesis. Left ventricular diastolic function could  not be evaluated. There is severe  akinesis of the left ventricular, basal-mid inferior and inferolateral  walls. Incoordinate (paced) septal motion.  2. Right ventricular systolic function is moderately reduced. The right  ventricular size  is mildly enlarged. There is severely elevated pulmonary  artery systolic pressure. The estimated right ventricular systolic  pressure is 21.1 mmHg.  3. Left atrial size was massively dilated.  4. Right atrial size was moderately dilated.  5. Mild mitral subvalvular calcification.  6. Mild mitral subvalvular thickening/fibrosis.  7. Tethering of the mitral valve posterior leaflet.  8. The mitral valve is abnormal. Severe mitral  valve regurgitation.  9. The tricuspid valve is abnormal. Tricuspid valve regurgitation is  moderate.  10. The aortic valve is tricuspid. Aortic valve regurgitation is mild.  Mild aortic valve sclerosis is present, with no evidence of aortic valve  stenosis.  11. Moderately dilated pulmonary artery.  12. The inferior vena cava is dilated in size with <50% respiratory  variability, suggesting right atrial pressure of 15 mmHg.   Comparison(s): Changes from prior study are noted. 01/04/2016: LVEF 40-45%,  pacer wire, mild AI, moderate MR, moderate LAE, mild RAE.    01/04/2016: TTE Study Conclusions  - Left ventricle: Inferior wall hypokinesis The cavity size was  moderately dilated. Wall thickness was increased in a pattern of  mild LVH. Systolic function was mildly to moderately reduced. The  estimated ejection fraction was in the range of 40% to 45%.  - Aortic valve: There was mild regurgitation.  - Mitral valve: There was moderate regurgitation.  - Left atrium: The atrium was moderately dilated.  - Right ventricle: The cavity size was mildly dilated.  - Right atrium: The atrium was mildly dilated.  - Atrial septum: No defect or patent foramen ovale was identified.  - Pulmonary arteries: PA peak pressure: 71 mm Hg (S).    Recent Labs: 01/10/2021: TSH 9.056 01/11/2021: B Natriuretic Peptide 504.0; Magnesium 2.0 01/13/2021: ALT 20; BUN 44; Creatinine, Ser 1.80; Hemoglobin 8.6; Platelets 201; Potassium 4.4; Sodium 136  No results found for requested  labs within last 8760 hours.   CrCl cannot be calculated (Unknown ideal weight.).   Wt Readings from Last 3 Encounters:  01/11/21 126 lb 15.8 oz (57.6 kg)  12/25/20 126 lb (57.2 kg)  12/17/20 131 lb 9.8 oz (59.7 kg)     Other studies reviewed: Additional studies/records reviewed today include: summarized above  ASSESSMENT AND PLAN:  1. PPM     Stable measurements     ***  2. ICM 3. Chronic CHF     Recent exacerbation     *** No SOB, lungs are clear     On BB/ACE, diurtetic tx     *** Trace edema  4. CAD     *** No anginal complaints     On BB, no ASA 2/2 GIBs     Following with Dr. Madaline Guthrie  5. HTN    Relative hypotension with medicines    ***   No changes    Have asked we be notified if persistent SBP ,100  6. Permanent AFib     CHA2DS2Vasc is  6, not on a/c 2/2 recurrent GIB history     *** rate controlled  7. VT     Initially occurred in the environment of febrile illness, COVID     reduction in LVEF, severe p.HTN, MR during his acute illness     Recurrent VT Jan 2022     *** None since      Not an ICD candidate     Amiodarone     ***        Disposition:***    Current medicines are reviewed at length with the patient today.  The patient did not have any concerns regarding medicines.  Venetia Night, PA-C 01/25/2021 12:08 PM     [place] [address] [place] [id number] [phone] (office)  [phone] (fax)

## 2021-01-26 DEATH — deceased

## 2021-02-01 ENCOUNTER — Encounter: Payer: Medicare HMO | Admitting: Physician Assistant

## 2021-02-01 DIAGNOSIS — R2689 Other abnormalities of gait and mobility: Secondary | ICD-10-CM | POA: Diagnosis not present

## 2021-02-01 DIAGNOSIS — U071 COVID-19: Secondary | ICD-10-CM | POA: Diagnosis not present

## 2021-02-01 DIAGNOSIS — R262 Difficulty in walking, not elsewhere classified: Secondary | ICD-10-CM | POA: Diagnosis not present

## 2022-01-13 IMAGING — DX DG CHEST 1V PORT SAME DAY
1 series · 1 of 1 positions shown · non-contrast
Comparison: October 23, 2020

CLINICAL DATA: Reported [id number] positive

EXAM:
PORTABLE CHEST 1 VIEW

[chest ap]
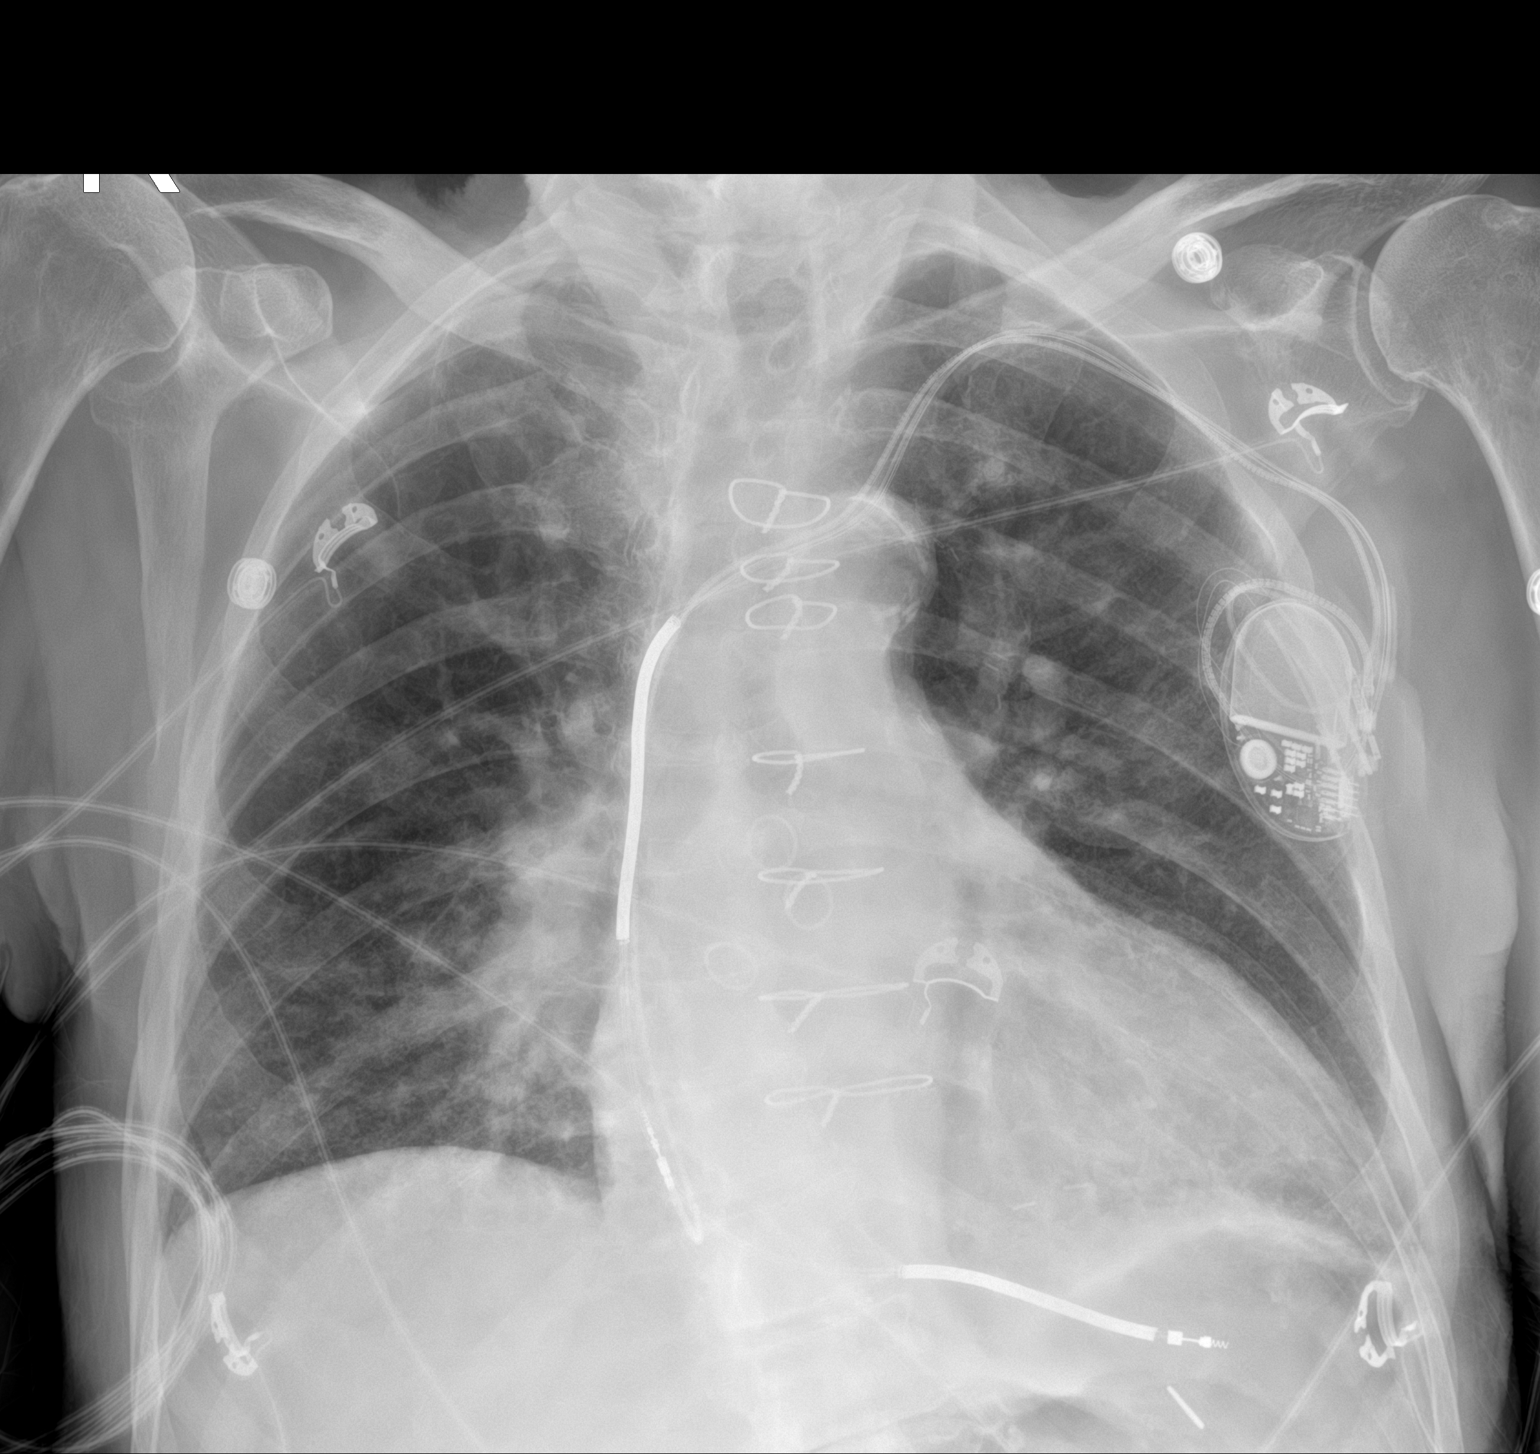

[1 of 1 positions shown; findings below may reference images not displayed]

FINDINGS: There is ill-defined opacity in the right base and left mid lung
regions. Left midlung ill-defined opacity is partly obscured by
pacemaker. There is mild left base atelectasis. No consolidation.
Heart is upper normal in size with pulmonary vascularity normal.
Pacemaker leads are attached to the right atrium and right
ventricle. Patient is status post coronary artery bypass grafting.
There is aortic atherosclerosis. No adenopathy no evident bone
lesions.
IMPRESSION: Areas of airspace opacity in the right base and left mid lung
regions which potentially may represent atypical organism pneumonia,
particularly given the clinical history. Mild left base atelectasis.
No consolidation.

Heart upper normal in size, stable. Pacemaker leads attached to
right atrium and right ventricle. Status post coronary artery bypass
grafting. Aortic Atherosclerosis ([id number]).
